# Patient Record
Sex: Female | Born: 1950 | Race: Black or African American | Hispanic: No | Smoking: Former smoker
Health system: Southern US, Community
[De-identification: ages and names within clinical notes are randomized; demographics above are authoritative.]

## PROBLEM LIST (undated history)

## (undated) DIAGNOSIS — J449 Chronic obstructive pulmonary disease, unspecified: Secondary | ICD-10-CM

## (undated) DIAGNOSIS — I739 Peripheral vascular disease, unspecified: Secondary | ICD-10-CM

## (undated) DIAGNOSIS — L97909 Non-pressure chronic ulcer of unspecified part of unspecified lower leg with unspecified severity: Secondary | ICD-10-CM

## (undated) DIAGNOSIS — E119 Type 2 diabetes mellitus without complications: Secondary | ICD-10-CM

## (undated) DIAGNOSIS — I10 Essential (primary) hypertension: Secondary | ICD-10-CM

## (undated) DIAGNOSIS — I219 Acute myocardial infarction, unspecified: Secondary | ICD-10-CM

## (undated) DIAGNOSIS — I2510 Atherosclerotic heart disease of native coronary artery without angina pectoris: Secondary | ICD-10-CM

## (undated) DIAGNOSIS — D649 Anemia, unspecified: Secondary | ICD-10-CM

## (undated) DIAGNOSIS — E785 Hyperlipidemia, unspecified: Secondary | ICD-10-CM

## (undated) DIAGNOSIS — N189 Chronic kidney disease, unspecified: Secondary | ICD-10-CM

## (undated) DIAGNOSIS — E875 Hyperkalemia: Secondary | ICD-10-CM

## (undated) DIAGNOSIS — I639 Cerebral infarction, unspecified: Secondary | ICD-10-CM

## (undated) DIAGNOSIS — J45901 Unspecified asthma with (acute) exacerbation: Secondary | ICD-10-CM

## (undated) DIAGNOSIS — J441 Chronic obstructive pulmonary disease with (acute) exacerbation: Secondary | ICD-10-CM

## (undated) DIAGNOSIS — N184 Chronic kidney disease, stage 4 (severe): Secondary | ICD-10-CM

## (undated) DIAGNOSIS — R300 Dysuria: Secondary | ICD-10-CM

## (undated) DIAGNOSIS — Z951 Presence of aortocoronary bypass graft: Secondary | ICD-10-CM

## (undated) DIAGNOSIS — B348 Other viral infections of unspecified site: Secondary | ICD-10-CM

## (undated) HISTORY — DX: Chronic kidney disease, stage 4 (severe): N18.4

## (undated) HISTORY — PX: BELOW KNEE LEG AMPUTATION: SUR23

## (undated) HISTORY — PX: TUBAL LIGATION: SHX77

## (undated) HISTORY — PX: CORONARY ARTERY BYPASS GRAFT: SHX141

## (undated) HISTORY — DX: Hyperkalemia: E87.5

## (undated) HISTORY — DX: Dysuria: R30.0

## (undated) HISTORY — DX: Unspecified asthma with (acute) exacerbation: J45.901

## (undated) HISTORY — DX: Chronic obstructive pulmonary disease with (acute) exacerbation: J44.1

## (undated) HISTORY — DX: Other viral infections of unspecified site: B34.8

## (undated) HISTORY — PX: COLONOSCOPY, DIAGNOSTIC (SCREENING): SHX174

---

## 2013-06-16 HISTORY — PX: OTHER SURGICAL HISTORY: SHX169

## 2014-02-27 DIAGNOSIS — I2510 Atherosclerotic heart disease of native coronary artery without angina pectoris: Secondary | ICD-10-CM

## 2014-02-27 DIAGNOSIS — L97909 Non-pressure chronic ulcer of unspecified part of unspecified lower leg with unspecified severity: Secondary | ICD-10-CM

## 2014-02-27 DIAGNOSIS — R5381 Other malaise: Secondary | ICD-10-CM

## 2014-02-27 DIAGNOSIS — N189 Chronic kidney disease, unspecified: Secondary | ICD-10-CM

## 2014-02-27 DIAGNOSIS — J449 Chronic obstructive pulmonary disease, unspecified: Secondary | ICD-10-CM

## 2014-02-27 DIAGNOSIS — R55 Syncope and collapse: Secondary | ICD-10-CM

## 2014-02-27 DIAGNOSIS — I214 Non-ST elevation (NSTEMI) myocardial infarction: Secondary | ICD-10-CM

## 2014-02-27 DIAGNOSIS — N183 Chronic kidney disease, stage 3 (moderate): Secondary | ICD-10-CM

## 2014-02-27 DIAGNOSIS — E1165 Type 2 diabetes mellitus with hyperglycemia: Secondary | ICD-10-CM

## 2014-02-28 ENCOUNTER — Inpatient Hospital Stay: Payer: BLUE CROSS/BLUE SHIELD | Admitting: Internal Medicine

## 2014-02-28 ENCOUNTER — Inpatient Hospital Stay: Payer: BLUE CROSS/BLUE SHIELD

## 2014-02-28 ENCOUNTER — Inpatient Hospital Stay
Admission: AD | Admit: 2014-02-28 | Discharge: 2014-03-14 | DRG: 235 | Disposition: A | Discharge status: Rehabilitation Facility | Payer: BLUE CROSS/BLUE SHIELD | Source: Other Acute Inpatient Hospital | Attending: Thoracic Surgery (Cardiothoracic Vascular Surgery) | Admitting: Thoracic Surgery (Cardiothoracic Vascular Surgery)

## 2014-02-28 DIAGNOSIS — I70234 Atherosclerosis of native arteries of right leg with ulceration of heel and midfoot: Secondary | ICD-10-CM | POA: Diagnosis present

## 2014-02-28 DIAGNOSIS — L97909 Non-pressure chronic ulcer of unspecified part of unspecified lower leg with unspecified severity: Secondary | ICD-10-CM

## 2014-02-28 DIAGNOSIS — I700 Atherosclerosis of aorta: Secondary | ICD-10-CM | POA: Diagnosis present

## 2014-02-28 DIAGNOSIS — I252 Old myocardial infarction: Secondary | ICD-10-CM

## 2014-02-28 DIAGNOSIS — N390 Urinary tract infection, site not specified: Secondary | ICD-10-CM | POA: Diagnosis present

## 2014-02-28 DIAGNOSIS — I739 Peripheral vascular disease, unspecified: Secondary | ICD-10-CM

## 2014-02-28 DIAGNOSIS — E119 Type 2 diabetes mellitus without complications: Secondary | ICD-10-CM | POA: Diagnosis present

## 2014-02-28 DIAGNOSIS — F419 Anxiety disorder, unspecified: Secondary | ICD-10-CM | POA: Diagnosis present

## 2014-02-28 DIAGNOSIS — I2582 Chronic total occlusion of coronary artery: Secondary | ICD-10-CM | POA: Diagnosis present

## 2014-02-28 DIAGNOSIS — N183 Chronic kidney disease, stage 3 unspecified: Secondary | ICD-10-CM

## 2014-02-28 DIAGNOSIS — L97419 Non-pressure chronic ulcer of right heel and midfoot with unspecified severity: Secondary | ICD-10-CM | POA: Diagnosis present

## 2014-02-28 DIAGNOSIS — I214 Non-ST elevation (NSTEMI) myocardial infarction: Secondary | ICD-10-CM

## 2014-02-28 DIAGNOSIS — J449 Chronic obstructive pulmonary disease, unspecified: Secondary | ICD-10-CM

## 2014-02-28 DIAGNOSIS — I080 Rheumatic disorders of both mitral and aortic valves: Secondary | ICD-10-CM | POA: Diagnosis present

## 2014-02-28 DIAGNOSIS — Z880 Allergy status to penicillin: Secondary | ICD-10-CM

## 2014-02-28 DIAGNOSIS — I255 Ischemic cardiomyopathy: Secondary | ICD-10-CM | POA: Diagnosis present

## 2014-02-28 DIAGNOSIS — Z955 Presence of coronary angioplasty implant and graft: Secondary | ICD-10-CM

## 2014-02-28 DIAGNOSIS — E875 Hyperkalemia: Secondary | ICD-10-CM | POA: Diagnosis not present

## 2014-02-28 DIAGNOSIS — Z794 Long term (current) use of insulin: Secondary | ICD-10-CM

## 2014-02-28 DIAGNOSIS — L97319 Non-pressure chronic ulcer of right ankle with unspecified severity: Secondary | ICD-10-CM

## 2014-02-28 DIAGNOSIS — I160 Hypertensive urgency: Secondary | ICD-10-CM | POA: Diagnosis present

## 2014-02-28 DIAGNOSIS — I2510 Atherosclerotic heart disease of native coronary artery without angina pectoris: Secondary | ICD-10-CM

## 2014-02-28 DIAGNOSIS — I129 Hypertensive chronic kidney disease with stage 1 through stage 4 chronic kidney disease, or unspecified chronic kidney disease: Secondary | ICD-10-CM | POA: Diagnosis present

## 2014-02-28 DIAGNOSIS — I10 Essential (primary) hypertension: Secondary | ICD-10-CM

## 2014-02-28 DIAGNOSIS — E785 Hyperlipidemia, unspecified: Secondary | ICD-10-CM | POA: Diagnosis present

## 2014-02-28 DIAGNOSIS — E1165 Type 2 diabetes mellitus with hyperglycemia: Secondary | ICD-10-CM

## 2014-02-28 DIAGNOSIS — E869 Volume depletion, unspecified: Secondary | ICD-10-CM | POA: Diagnosis present

## 2014-02-28 DIAGNOSIS — D631 Anemia in chronic kidney disease: Secondary | ICD-10-CM | POA: Diagnosis present

## 2014-02-28 DIAGNOSIS — Z8249 Family history of ischemic heart disease and other diseases of the circulatory system: Secondary | ICD-10-CM

## 2014-02-28 DIAGNOSIS — N189 Chronic kidney disease, unspecified: Secondary | ICD-10-CM

## 2014-02-28 DIAGNOSIS — E1122 Type 2 diabetes mellitus with diabetic chronic kidney disease: Secondary | ICD-10-CM | POA: Diagnosis present

## 2014-02-28 DIAGNOSIS — N170 Acute kidney failure with tubular necrosis: Secondary | ICD-10-CM | POA: Diagnosis present

## 2014-02-28 DIAGNOSIS — E872 Acidosis: Secondary | ICD-10-CM | POA: Diagnosis present

## 2014-02-28 DIAGNOSIS — Z87891 Personal history of nicotine dependence: Secondary | ICD-10-CM

## 2014-02-28 HISTORY — DX: Anemia, unspecified: D64.9

## 2014-02-28 HISTORY — DX: Peripheral vascular disease, unspecified: I73.9

## 2014-02-28 HISTORY — DX: Chronic kidney disease, unspecified: N18.9

## 2014-02-28 HISTORY — DX: Atherosclerotic heart disease of native coronary artery without angina pectoris: I25.10

## 2014-02-28 HISTORY — DX: Acute myocardial infarction, unspecified: I21.9

## 2014-02-28 HISTORY — DX: Essential (primary) hypertension: I10

## 2014-02-28 HISTORY — DX: Non-pressure chronic ulcer of unspecified part of unspecified lower leg with unspecified severity: L97.909

## 2014-02-28 HISTORY — DX: Hyperlipidemia, unspecified: E78.5

## 2014-02-28 HISTORY — DX: Chronic obstructive pulmonary disease, unspecified: J44.9

## 2014-02-28 HISTORY — DX: Type 2 diabetes mellitus without complications: E11.9

## 2014-02-28 LAB — CBC AND DIFFERENTIAL
Basophils Absolute Automated: 0.01 10*3/uL (ref 0.00–0.20)
Basophils Automated: 0 %
Eosinophils Absolute Automated: 0.57 10*3/uL (ref 0.00–0.70)
Eosinophils Automated: 8 %
Hematocrit: 30.4 % — ABNORMAL LOW (ref 37.0–47.0)
Hgb: 9 g/dL — ABNORMAL LOW (ref 12.0–16.0)
Immature Granulocytes Absolute: 0.02 10*3/uL
Immature Granulocytes: 0 %
Lymphocytes Absolute Automated: 1.92 10*3/uL (ref 0.50–4.40)
Lymphocytes Automated: 28 %
MCH: 24.6 pg — ABNORMAL LOW (ref 28.0–32.0)
MCHC: 29.6 g/dL — ABNORMAL LOW (ref 32.0–36.0)
MCV: 83.1 fL (ref 80.0–100.0)
MPV: 10 fL (ref 9.4–12.3)
Monocytes Absolute Automated: 0.76 10*3/uL (ref 0.00–1.20)
Monocytes: 11 %
Neutrophils Absolute: 3.59 10*3/uL (ref 1.80–8.10)
Neutrophils: 52 %
Nucleated RBC: 0 /100 WBC (ref 0–1)
Platelets: 297 10*3/uL (ref 140–400)
RBC: 3.66 10*6/uL — ABNORMAL LOW (ref 4.20–5.40)
RDW: 17 % — ABNORMAL HIGH (ref 12–15)
WBC: 6.87 10*3/uL (ref 3.50–10.80)

## 2014-02-28 LAB — COMPREHENSIVE METABOLIC PANEL
ALT: 13 U/L (ref 0–55)
AST (SGOT): 81 U/L — ABNORMAL HIGH (ref 5–34)
Albumin/Globulin Ratio: 0.6 — ABNORMAL LOW (ref 0.9–2.2)
Albumin: 2.7 g/dL — ABNORMAL LOW (ref 3.5–5.0)
Alkaline Phosphatase: 64 U/L (ref 37–106)
BUN: 24 mg/dL — ABNORMAL HIGH (ref 7.0–19.0)
Bilirubin, Total: 0.1 mg/dL — ABNORMAL LOW (ref 0.2–1.2)
CO2: 23 mEq/L (ref 22–29)
Calcium: 8.8 mg/dL (ref 8.5–10.5)
Chloride: 108 mEq/L (ref 100–111)
Creatinine: 1.3 mg/dL — ABNORMAL HIGH (ref 0.6–1.0)
Globulin: 4.3 g/dL — ABNORMAL HIGH (ref 2.0–3.6)
Glucose: 138 mg/dL — ABNORMAL HIGH (ref 70–100)
Potassium: 4.8 mEq/L (ref 3.5–5.1)
Protein, Total: 7 g/dL (ref 6.0–8.3)
Sodium: 138 mEq/L (ref 136–145)

## 2014-02-28 LAB — PHOSPHORUS: Phosphorus: 2.7 mg/dL (ref 2.3–4.7)

## 2014-02-28 LAB — APTT: PTT: 34 s (ref 23–37)

## 2014-02-28 LAB — TROPONIN I: Troponin I: 21.49 ng/mL — ABNORMAL HIGH (ref 0.00–0.09)

## 2014-02-28 LAB — GLUCOSE WHOLE BLOOD - POCT: Whole Blood Glucose POCT: 146 mg/dL — ABNORMAL HIGH (ref 70–100)

## 2014-02-28 LAB — MAGNESIUM: Magnesium: 1.9 mg/dL (ref 1.6–2.6)

## 2014-02-28 LAB — GFR: EGFR: 41.1

## 2014-02-28 MED ORDER — HEPARIN (PORCINE) IN D5W 50-5 UNIT/ML-% IV SOLN
18.00 [IU]/kg/h | INTRAVENOUS | Status: DC
Start: 2014-02-28 — End: 2014-02-28

## 2014-02-28 MED ORDER — ATORVASTATIN CALCIUM 80 MG PO TABS
80.00 mg | ORAL_TABLET | Freq: Every day | ORAL | Status: DC
Start: 2014-02-28 — End: 2014-03-03
  Administered 2014-03-01 – 2014-03-03 (×3): 80 mg via ORAL
  Filled 2014-02-28 (×3): qty 1

## 2014-02-28 MED ORDER — OXYCODONE-ACETAMINOPHEN 5-325 MG PO TABS
2.00 | ORAL_TABLET | ORAL | Status: DC | PRN
Start: 2014-02-28 — End: 2014-02-28
  Administered 2014-02-28: 2 via ORAL
  Filled 2014-02-28: qty 2

## 2014-02-28 MED ORDER — HYDRALAZINE HCL 25 MG PO TABS
50.0000 mg | ORAL_TABLET | Freq: Three times a day (TID) | ORAL | Status: DC
Start: 2014-02-28 — End: 2014-03-03
  Administered 2014-02-28 – 2014-03-03 (×8): 50 mg via ORAL
  Filled 2014-02-28 (×8): qty 2

## 2014-02-28 MED ORDER — FLUTICASONE-SALMETEROL 230-21 MCG/ACT IN AERO
2.00 | INHALATION_SPRAY | Freq: Two times a day (BID) | RESPIRATORY_TRACT | Status: DC
Start: 2014-02-28 — End: 2014-03-03
  Administered 2014-02-28 – 2014-03-02 (×3): 2 via RESPIRATORY_TRACT
  Filled 2014-02-28: qty 8

## 2014-02-28 MED ORDER — LORAZEPAM 1 MG PO TABS
0.50 mg | ORAL_TABLET | Freq: Three times a day (TID) | ORAL | Status: DC | PRN
Start: 2014-02-28 — End: 2014-03-04

## 2014-02-28 MED ORDER — ASPIRIN 81 MG PO CHEW
81.00 mg | CHEWABLE_TABLET | Freq: Once | ORAL | Status: DC
Start: 2014-02-28 — End: 2014-03-04

## 2014-02-28 MED ORDER — CARVEDILOL 6.25 MG PO TABS
6.25 mg | ORAL_TABLET | Freq: Two times a day (BID) | ORAL | Status: DC
Start: 2014-02-28 — End: 2014-03-01
  Administered 2014-02-28 – 2014-03-01 (×2): 6.25 mg via ORAL
  Filled 2014-02-28 (×2): qty 1

## 2014-02-28 MED ORDER — NITROGLYCERIN IN D5W 200-5 MCG/ML-% IV SOLN
5.00 ug/min | INTRAVENOUS | Status: DC
Start: 2014-02-28 — End: 2014-03-01
  Administered 2014-02-28: 40 ug/min via INTRAVENOUS
  Filled 2014-02-28: qty 250

## 2014-02-28 MED ORDER — DEXTROSE 50 % IV SOLN
25.00 mL | INTRAVENOUS | Status: DC | PRN
Start: 2014-02-28 — End: 2014-03-04

## 2014-02-28 MED ORDER — INSULIN ASPART 100 UNIT/ML SC SOLN
1.00 [IU] | SUBCUTANEOUS | Status: DC | PRN
Start: 2014-02-28 — End: 2014-03-04
  Administered 2014-03-01: 1 [IU] via SUBCUTANEOUS
  Filled 2014-02-28: qty 1

## 2014-02-28 MED ORDER — ALBUTEROL-IPRATROPIUM 2.5-0.5 (3) MG/3ML IN SOLN
3.00 mL | Freq: Four times a day (QID) | RESPIRATORY_TRACT | Status: DC | PRN
Start: 2014-02-28 — End: 2014-03-03

## 2014-02-28 MED ORDER — GLUCAGON 1 MG IJ SOLR (WRAP)
1.00 mg | INTRAMUSCULAR | Status: DC | PRN
Start: 2014-02-28 — End: 2014-03-04

## 2014-02-28 MED ORDER — OXYCODONE-ACETAMINOPHEN 5-325 MG PO TABS
1.00 | ORAL_TABLET | ORAL | Status: DC | PRN
Start: 2014-02-28 — End: 2014-03-04
  Administered 2014-03-01 – 2014-03-04 (×7): 1 via ORAL
  Filled 2014-02-28 (×7): qty 1

## 2014-02-28 MED ORDER — TIOTROPIUM BROMIDE MONOHYDRATE 18 MCG IN CAPS
18.00 ug | ORAL_CAPSULE | Freq: Every morning | RESPIRATORY_TRACT | Status: DC
Start: 2014-02-28 — End: 2014-03-03
  Administered 2014-03-01 – 2014-03-02 (×2): 18 ug via RESPIRATORY_TRACT
  Filled 2014-02-28: qty 5

## 2014-02-28 MED ORDER — HEPARIN SODIUM (PORCINE) 5000 UNIT/ML IJ SOLN
3000.00 [IU] | INTRAMUSCULAR | Status: DC | PRN
Start: 2014-02-28 — End: 2014-03-04
  Administered 2014-03-02: 3000 [IU] via INTRAVENOUS
  Filled 2014-02-28: qty 1

## 2014-02-28 MED ORDER — HEPARIN SODIUM (PORCINE) 5000 UNIT/ML IJ SOLN
80.00 [IU]/kg | INTRAMUSCULAR | Status: DC | PRN
Start: 2014-02-28 — End: 2014-02-28

## 2014-02-28 MED ORDER — MORPHINE SULFATE 4 MG/ML IJ/IV SOLN (WRAP)
0.5000 mg | Freq: Once | Status: DC
Start: 2014-02-28 — End: 2014-02-28
  Filled 2014-02-28: qty 1

## 2014-02-28 MED ORDER — ALBUTEROL-IPRATROPIUM 2.5-0.5 (3) MG/3ML IN SOLN
3.00 mL | Freq: Four times a day (QID) | RESPIRATORY_TRACT | Status: DC
Start: 2014-02-28 — End: 2014-02-28
  Administered 2014-02-28: 3 mL via RESPIRATORY_TRACT
  Filled 2014-02-28: qty 3

## 2014-02-28 MED ORDER — VENLAFAXINE HCL ER 75 MG PO CP24
75.0000 mg | ORAL_CAPSULE | ORAL | Status: DC
Start: 2014-02-28 — End: 2014-03-04
  Administered 2014-02-28 – 2014-03-03 (×4): 75 mg via ORAL
  Filled 2014-02-28 (×5): qty 1

## 2014-02-28 MED ORDER — GABAPENTIN 300 MG PO CAPS
600.00 mg | ORAL_CAPSULE | Freq: Three times a day (TID) | ORAL | Status: DC
Start: 2014-02-28 — End: 2014-03-04
  Administered 2014-02-28 – 2014-03-04 (×11): 600 mg via ORAL
  Filled 2014-02-28 (×11): qty 2

## 2014-02-28 MED ORDER — HEPARIN SODIUM (PORCINE) 5000 UNIT/ML IJ SOLN
80.00 [IU]/kg | Freq: Once | INTRAMUSCULAR | Status: DC
Start: 2014-02-28 — End: 2014-02-28

## 2014-02-28 MED ORDER — POLYETHYLENE GLYCOL 3350 17 G PO PACK
17.0000 g | PACK | Freq: Every day | ORAL | Status: DC
Start: 2014-02-28 — End: 2014-03-04
  Filled 2014-02-28 (×2): qty 1

## 2014-02-28 MED ORDER — HYDRALAZINE HCL 25 MG PO TABS
50.0000 mg | ORAL_TABLET | Freq: Three times a day (TID) | ORAL | Status: DC
Start: 2014-02-28 — End: 2014-02-28

## 2014-02-28 MED ORDER — VENLAFAXINE HCL 75 MG PO TABS
75.0000 mg | ORAL_TABLET | Freq: Two times a day (BID) | ORAL | Status: DC
Start: 2014-02-28 — End: 2014-02-28
  Filled 2014-02-28: qty 1

## 2014-02-28 MED ORDER — VITAMIN C 500 MG PO TABS
500.0000 mg | ORAL_TABLET | Freq: Every day | ORAL | Status: DC
Start: 2014-02-28 — End: 2014-03-04
  Administered 2014-03-01 – 2014-03-03 (×3): 500 mg via ORAL
  Filled 2014-02-28 (×3): qty 1

## 2014-02-28 MED ORDER — MAGNESIUM SULFATE IN D5W 10-5 MG/ML-% IV SOLN
1.0000 g | INTRAVENOUS | Status: AC
Start: 2014-02-28 — End: 2014-02-28
  Administered 2014-02-28 (×2): 1 g via INTRAVENOUS
  Filled 2014-02-28 (×2): qty 100

## 2014-02-28 MED ORDER — SENNA 8.6 MG PO TABS
8.6000 mg | ORAL_TABLET | Freq: Two times a day (BID) | ORAL | Status: DC
Start: 2014-02-28 — End: 2014-03-04
  Administered 2014-03-03 (×2): 8.6 mg via ORAL
  Filled 2014-02-28 (×3): qty 1

## 2014-02-28 MED ORDER — HEPARIN SODIUM (PORCINE) 5000 UNIT/ML IJ SOLN
40.00 [IU]/kg | INTRAMUSCULAR | Status: DC | PRN
Start: 2014-02-28 — End: 2014-02-28

## 2014-02-28 MED ORDER — HEPARIN (PORCINE) IN D5W 50-5 UNIT/ML-% IV SOLN
12.00 [IU]/kg/h | INTRAVENOUS | Status: DC
Start: 2014-02-28 — End: 2014-03-04
  Administered 2014-02-28: 850 [IU]/h via INTRAVENOUS
  Administered 2014-03-01: 950 [IU]/h via INTRAVENOUS
  Administered 2014-03-03: 850 [IU]/h via INTRAVENOUS
  Administered 2014-03-04: 950 [IU]/h via INTRAVENOUS
  Filled 2014-02-28 (×4): qty 500

## 2014-02-28 NOTE — Plan of Care (Signed)
PGY 3 Critical Care Unit  Patient with signs of anxiety.  Further reviewed home medication list extensively and it appears patient was taking Effexor XR 75 daily.  Will resume this medication in addition to providing when necessary anxiolytic at very low dose.  Patient is not elderly &  with no active contraindications to anxiolytic.

## 2014-02-28 NOTE — H&P (Signed)
ADMISSION HISTORY AND PHYSICAL EXAM    Date Time: 02/28/2014 6:34 PM  Patient Name: [name],[name]  Attending Physician: Marikay Alar, MD  Primary Care Physician: No primary care provider on file.    CC: Transfer for possible coronary artery bypass graft      Assessment:   Active Problems:    NSTEMI (non-ST elevated myocardial infarction)    63 year old female with CAD , status post presumed DES to left circumflex ~ January 2015, further clarification of multivessel CAD on 02/28/14 angiography at [facility] with: 1.  Distal left main, 60 % stenosis, ostial.  2.  Ostial circumflex, 80 % stenosis, ulcerated.  3.  3rd OM with ostial lesion.  4 100 % mid RCA with left to right collaterals; PAD w/ chronic -right lower extremity arterial ulcer- overall improving without any features of infection; IDDM (HA1c=7.1), CKD,  admitted with syncope on 02/26/14 at [facility] for syncope.  Mild troponin elevation to 0.15 with suggestion of an NSTEMI, prompting LHC 02/28/14 with results as featured above , prompting transfer to [facility] for possible CABG setting of multivessel disease.  Now with evidence of type 4a MI associated with PCI  (trop=21 on 02/28/14).  Clinical concern that chronic right LE ulcer may present contraindication to surgery.  Will evaluate further with infectious disease 03/01/14. AKI on CKD overall resolving.         Plan:         #NSTEMI VS Type 4A MI: No EKG changes, no chest pain.  Isolated troponin bump from 0.15 to 21  Following angiography 12/14.  Trending troponins.  Continue Coreg, continue aspirin, continue low intensity heparin drip, continue atorvastatin 80, hold ACE inhibitor with recovering AKI hold clopidogrel with possible coronary artery bypass graft this week.  Titrate down nitro drip as patient experiencing headache.     # Multivessel CAD: 1.  Distal left main, 60 % stenosis, ostial.  2.  Ostial circumflex, 80 % stenosis, ulcerated.  3.  3rd OM with ostial lesion.  4 100 % mid RCA with  left to right collaterals; Chronically occluded right coronary artery stent  (per Angio 06/2013).   -> cardiothoracic surgery to evaluate potential for coronary artery bypass graft.      #PAD w/ chronic -medial right lower extremity arterial ulcer-status post angioplasty in April 2015.  Clinically improving after reviewing patient's photographs.  No clinical evidence of infection at this time.  Wound culture from 12/13 at [facility] indicates many mixed flora with no predominating organism.  We will ask infectious disease to assess in the setting of potential coronary artery bypass grafts.    -> wound nurse consultation placed.  Follow-up blood cultures.   ->  decreased from 2 tabs to 1 tab Percocet per patient.      #hypertension: Difficult to control with headache and right lower extremity pain.  Taper down nitro drip as patient experiencing headache.  Increase frequency of home hydralazine to 50 every 8.  Increase Coreg to 6.25 every 12.  May need to give clonidine 0.1 if pain control does not improve headache.    #type 2 diabetes: Insulin-dependent.  H A1c = 7.1 on 12/12.  Patient reports only using sliding scale insulin with meals at home.  Will continue sliding scale insulin-low dose at present.  We will reevaluate if Patient is candidate for basal insulin, but currently well controlled on outpatient regimen  It seems.    #COPD: Wheezy following transfer on exam. Reviewed records from April.  Patient was  to be taking both Spiriva and Symbicort as outpatient.  The patient had not been taking these medications recently.  Check x-ray.  Resume Spiriva and Symbicort equivalent while in hospital and provide standing Q 6 Duonebs at present.      #asymptomatic bacteriuria.  Greater than 100,000 colony-forming units GNR at [facility].patient is without symptoms.  Repeat urinalysis, consider antibiotic therapy if surgery being considered in setting of diabetes.  Will discuss further with infectious disease.      #Syncope: Felt  to be vasovagal versus cardiogenic versus hypovolemic from excessive outpatient diuresis.  No further evidence of syncope recurring since admission.  Continue to monitor on telemetry.    #AKI 2/2: Volume depletion and iatrogenic injury on outpatient ACE inhibitor, nonsteroidal anti-inflammatory drug, Lasix , with underlying CKD.  CKD felt to be secondary to diabetes, hypertension.  Course:C  r =1.4 on 02/28/14, 2.3 on 02/26/14.  Continuing to hold ACE inhibitor and Lasix.  Will quantify proteinuria.  Obtain additional urine studies including urine electrolytes.  Requested nephrology assistance in assessing prior to coronary artery bypass graft.  Hold on further IVF this time.      #  Anemia: due to chronic disease and iron deficiency at [facility].  Plan was in place for patient to receive dose of Procrit and course of venofer.  Sending iron studies.  Check type and screen with morning labs     #FEN: Cardiac diet, no IV fluid are present.  Will give magnesium 1 g      Reviewed initial management for type 4 MI with Dr. Laveda Norman  We will review further with Dr. Penni Homans.   Disposition:     Today's date: 02/28/2014  Admit Date: 02/28/2014  3:41 PM  Anticipated medical stability for discharge:Red - not tomorrow - estimated month/date: 03/06/14  Service status: Inpatient: risk of morbidity and mortality  Reason for ongoing hospitalization: Possible CABG needed   Anticipated discharge needs: Rehab      History of Presenting Illness:   [name] is a 63 y.o. female w/ coronary artery disease -multivessel being transferred to [facility] for possible coronary artery bypass graft.       Patient has known coronary artery disease, s/p Left circumflex artery w/  presumed DES &   Chronically occluded right coronary artery stent  (per LHC 06/2013) who is  status post 02/28/14 angiography  (@ [facility]) with 1.  Distal left main, 60 % stenosis, ostial.  2.  Ostial circumflex, 80 % stenosis, ulcerated.  3.  3rd OM with  ostial lesion.  4 100 % mid RCA with left to right collaterals; peripheral arterial disease -right lower extremity arterial ulcer S/P angiography, angioplasty, right lower extremity 4/15.      She presented to the emergency room after syncopal event at home.  Essentially, patient awoke in the morning of 12/12 feeling somewhat lightheaded.  She took her normal medications and then ate breakfast.  She noted to have a normal blood glucose.  Also the kitchen table talking to her daughter, she felt lightheaded and had a syncopal event, falling forward and injuring her left head area above left eye.  Syncopal event lasted roughly less than 6/32.  There was no seizure activity, no focal weakness, no bladder or bowel incontinence, no palpitations, no chest pain.  No numbness and tingling, no nausea, vomiting.    Catheterization done on 02/28/14 by Dr. Rodman Key with following findings: 1.  Distal left main, 60 % stenosis, ostial.  2.  Ostial circumflex, 80 % stenosis, ulcerated.  3.  3rd OM with ostial lesion.  4 100 % mid RCA with left to right collaterals.  LV angiogram not done to preserve contrast load, echo, ejection fraction noted to be 60 percent.  Following these angiography findings, patient was recommended to be transferred to [facility] for CV surgery.    Patient was seen by Dr. Thom Chimes with [facility] on 02/28/14 and the assessment was made of AKI on CK D with suspected chronic kidney disease stage 2/3 from hypertension and diabetes with proteinuria seen on urinalysis.  In addition to increased echogenicity on sono.  AKI thought to be secondary to volume depletion compounded by ACE inhibitor, nonsteroidal anti-inflammatory drug at home-naproxen, Lasix.  With notable resolving course since admission.    Patient was seen by Lyla Son and cardiology group including Dr. Forestine Na and patient was found to have evidence of an STEMI during chest pain on the morning of 12/14  and was initiated on heparin and nitro drips.  Echo dated 02/27/14: Normal left ventricular size with mild concentric left ventricular hypertrophy and stage I diastolic dysfunction.  Ejection fraction = 55-50 percent, mild aortic stenosis.  No regurgitation.  Mitral annular calcification with moderate mitral regurgitation.  Upper normal RSVP.  No cardiac emboli sources detected.  LVOT area = 2.37 m.    Troponins reviewed as follows: 12/13 at 04:43 = less than 0.01, 02/27/14 at 10:50 AM less than 0.01, 02/28/14 at 02:33 less than 0.01.  Troponin on 02/28/14 = 0.15.  HG A1c = 7.1    Key labs reviewed from outside hospital data: Sodium = 137, Cr =1.4 on 02/28/14, 2.3 on 02/26/14.  WBC= 7.0.  Hemoglobin = 9.4 on 02/28/14 and 7.8 on 02/27/14.  MCV = 82.  Platelet = 287      Of note, admission earlier this year to [facility] with acute respiratory distress and junctional rhythm with bradycardia requiring atropine and intubation for respiratory failure.  Cardiac angiography was performed on this April admission w/   Unchanged left main disease and chronically occluded right coronary artery stent and patent left circumflex artery.  Transaortic balloon pump was placed on the admission.  Essentially, she was felt to have an STEMI with cardiogenic shock on that April admission.    Past Medical History:     Past Medical History   Diagnosis Date   . Diabetes mellitus    . Chronic kidney disease    . Myocardial infarction    . Hypertension    . Hyperlipidemia    . Peripheral arterial disease      06/28/13 right superficial femoral artery stenosis, tibial peroneal trunk stenosis, left CVL a stenosis, treated with angiography and angioplasty.   . Coronary artery disease      Left circumflex artery presumed DES.,  Chronically occluded right coronary artery stent   . Anemia      She redid to chronic disease and iron deficiency.     . Arterial leg ulcer      2014, receiving home health        Available old records reviewed, including:  All records from [facility] that are available for review.  Records from [facility].    Past Surgical History:     Past Surgical History   Procedure Laterality Date   . Cardiac angiography and angioplasty  06/2013   . Colonoscopy  Unknown  Family History:   Mother with diabetes and MI at 42.  Father with diabetes and MI 66.  Brother was deceased 18 with congestive heart failure.    Social History:     History   Smoking status   . Not on file   Smokeless tobacco   . Not on file     History   Alcohol Use: Not on file     History   Drug Use Not on file       Allergies:     Allergies   Allergen Reactions   . Penicillins Anaphylaxis and Angioedema       Medications:   HOME MEDICATIONS BELOW.  Current Discharge Medication List      CONTINUE these medications which have NOT CHANGED    Details   aspirin 81 MG chewable tablet Chew 81 mg by mouth daily.      atorvastatin (LIPITOR) 10 MG tablet Take 10 mg by mouth nightly.      carvedilol (COREG) 3.125 MG tablet Take 3.125 mg by mouth 2 (two) times daily with meals.      furosemide (LASIX) 20 MG tablet Take 20 mg by mouth daily.         gabapentin (NEURONTIN) 300 MG capsule Take 600 mg by mouth 3 (three) times daily.         hydrALAZINE (APRESOLINE) 50 MG tablet Take 50 mg by mouth 2 (two) times daily.      insulin lispro (HUMALOG) 100 UNIT/ML injection Inject into the skin 4 times daily - with meals and at bedtime.      lisinopril (PRINIVIL,ZESTRIL) 5 MG tablet Take 2.5 mg by mouth daily.      naproxen (NAPROSYN) 250 MG tablet Take 250 mg by mouth daily.           Medications on transfer include hydralazine 50 every 12, famotidine 20 daily, Coreg 6.25 twice a day, levofloxacin 250 every 24, sliding scale insulin, aspirin 81 daily, atorvastatin 10 daily at bedtime, gabapentin 300 3 times a day.  Isosorbide dinitrate 20 mg daily.    Method by which medications were confirmed on admission: Pt    Review of  Systems:   All other systems were reviewed and are negative except: as above: NO CP, NO Palpitations, No SOB     Physical Exam:     Patient Vitals for the past 24 hrs:   BP Temp Temp src Pulse Resp SpO2 Height Weight   02/28/14 1730 180/82 mmHg - - 88 14 98 % - -   02/28/14 1700 179/77 mmHg - - 90 (!) 25 99 % - -   02/28/14 1645 181/84 mmHg - - 91 15 99 % - -   02/28/14 1630 181/83 mmHg - - 90 17 99 % - -   02/28/14 1615 181/66 mmHg - - 89 16 99 % - -   02/28/14 1600 191/88 mmHg 98.5 F (36.9 C) Axillary 87 16 99 % 1.562 m (5' 1.5") 72.712 kg (160 lb 4.8 oz)     Body mass index is 29.8 kg/(m^2).  No intake or output data in the 24 hours ending 02/28/14 1834    General: Pleasant, somewhat tired appearing African-American woman.  awake, alert, oriented x 3; no acute distress.  HEENT: Mild abrasion with hematoma above left eye.  Mildly tender to palpation.    Neck: Difficult to visualize JVP.  Hepatojugular reflux negative.  Cardiovascular: Normal rate, regular rhythm.  2-3/6 early harsh systolic murmur at right  upper sternal border without radiation to carotids, no S3.  No S4.  Normal S2 splitting with inspiration.  Lungs: Decreased breath sounds at both bases bilaterally.  Wheezing notable  Abdomen: soft, non-tender, non-distended; no palpable masses, no hepatosplenomegaly, normoactive bowel sounds, no rebound or guarding  Extremities:     right medial lower extremity ulcer.  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  Scheffler tendon and bone.  No surrounding erythema.  No purulence.  Granulation tissue seen surrounding bone   - chronic ischemic changes seen to bilateral feet with thin lower extremity muscles and no hair.  No lower extremity edema.  Right femoral cath site with no evidence of hematoma, no pulsation, no swelling.  Neuro: cranial nerves grossly intact, sensation intact.  Strength grossly intact.  Skin: As above  Other: No Foley.  Pelvic inflammatory disease on right hand.      Labs:     Results      Procedure Component Value Units Date/Time    Comprehensive metabolic panel [[id number]]  (Abnormal) Collected:  02/28/14 1728    Specimen Information:  Blood Updated:  02/28/14 1808     Glucose 138 (H) mg/dL      BUN 01.7 (H) mg/dL      Creatinine 1.3 (H) mg/dL      Sodium 510 mEq/L      Potassium 4.8 mEq/L      Chloride 108 mEq/L      CO2 23 mEq/L      CALCIUM 8.8 mg/dL      Protein, Total 7.0 g/dL      Albumin 2.7 (L) g/dL      AST (SGOT) 81 (H) U/L      ALT 13 U/L      Alkaline Phosphatase 64 U/L      Bilirubin, Total 0.1 (L) mg/dL      Globulin 4.3 (H) g/dL      Albumin/Globulin Ratio 0.6 (L)     Narrative:      Starting for 1 Occurrences    Magnesium [[id number]] Collected:  02/28/14 1728    Specimen Information:  Blood Updated:  02/28/14 1808     Magnesium 1.9 mg/dL     Narrative:      Starting for 1 Occurrences    Phosphorus [[id number]] Collected:  02/28/14 1728    Specimen Information:  Blood Updated:  02/28/14 1808     Phosphorus 2.7 mg/dL     Narrative:      Starting for 1 Occurrences    GFR [[id number]] Collected:  02/28/14 1728     EGFR 41.1 Updated:  02/28/14 1808    Narrative:      Starting for 1 Occurrences    Troponin I [[id number]]  (Abnormal) Collected:  02/28/14 1728    Specimen Information:  Blood Updated:  02/28/14 1806     Troponin I 21.49 (H) ng/mL     Narrative:      Starting for 1 Occurrences    APTT [[id number]] Collected:  02/28/14 1728     PTT 34 sec Updated:  02/28/14 1754    Narrative:      Starting for 1 Occurrences    CBC and differential [[id number]]  (Abnormal) Collected:  02/28/14 1728    Specimen Information:  Blood / Blood Updated:  02/28/14 1746     WBC 6.87 x10 3/uL      RBC 3.66 (L) x10 6/uL      Hgb 9.0 (L) g/dL      Hematocrit  30.4 (L) %      MCV 83.1 fL      MCH 24.6 (L) pg      MCHC 29.6 (L) g/dL      RDW 17 (H) %      Platelets 297 x10 3/uL      MPV 10.0 fL      Neutrophils 52 %      Lymphocytes Automated 28 %      Monocytes 11 %      Eosinophils Automated 8 %      Basophils  Automated 0 %      Immature Granulocyte 0 %      Nucleated RBC 0 /100 WBC      Neutrophils Absolute 3.59 x10 3/uL      Abs Lymph Automated 1.92 x10 3/uL      Abs Mono Automated 0.76 x10 3/uL      Abs Eos Automated 0.57 x10 3/uL      Absolute Baso Automated 0.01 x10 3/uL      Absolute Immature Granulocyte 0.02 x10 3/uL     Narrative:      Starting for 1 Occurrences    MRSA culture [[id number]] Collected:  02/28/14 1657    Specimen Information:  Body Fluid / Nares and Throat Updated:  02/28/14 1657        Wound culture dated 12/13 = many mixed flora with no predominant organism.  Urine culture greater than 100,000 GNR's.      Imaging results from outside hospital reviewed with the following findings:  Following studies are taken from [facility] records.  Ultrasound kidneys done 02/27/14.  Impression: 1.  Bilateral increased renal cortical echogenicity consistent with medical renal disease.  2.  Complex 2.2 cm cyst involving upper pole of left kidney.  Follow-up ultrasound or MRI is recommended 3 months.  3.  Large bladder volume.  Patient was unable to void.    CT without contrast dated 02/26/14:1.  No CT evidence of acute intracranial pathology.  2.  Mild diffuse cerebral atrophy, and minimal chronic deep white matter microvascular ischemic change.  3.  Bilateral-severe ethmoid, mild to moderate bilateral maxillary sinus disease.    EKG review from [place] 02/28/14 7:35 AM  left ventricular hypertrophy with secondary report normality.  Sinus rhythm.  Borderline prolonged QTC.  Isolated Q wave in lead 3.  Borderline concavities.  T waves with abnormality consistent to April 2015.  EKG at 6:14 AM notable for possible anterolateral infarction with pronounced Q waves in lead, ST depressions in V4 V6, V5    Safety Checklist  DVT prophylaxis:  CHEST guideline (See page [id number]) Chemical   Foley:  Coffeen Rn Foley protocol Not present   IVs:  Peripheral IV   PT/OT: Ordered   Daily CBC & or  Chem ordered:  SHM/ABIM guidelines (see #5) Yes, due to clinical and lab instability   Reference for approximate charges of common labs: CBC auto diff - $76  BMP - $99  Mg - $79    Signed by: Leroy Libman., MD   cc:No primary care provider on file.

## 2014-02-28 NOTE — Progress Notes (Signed)
Bedside spirometry measurement performed .Unable to obtain more then one (Good blow)  Despite multiple attempt Patient effort was fair. Result is faxed     02/28/14 1950   Spirometry Pre and/or Post   Procedure Location and Type Bedside- Pre Only   Pre FVC 0.99 L   Pre FEV1 0.57 L   Pre FEV1/FVC % (Calculated) 58   Patient follows commands Yes   Cough during exhalation No   Early termination Yes   Leak No   Variable effort Yes   2 FVC 150 ml of each other No   2 FEV1 150 ml of each other No   3 maneuvers performed Yes   Patient Effort Fair   Adverse Reactions None   Primary Charges   $ Spirometry Type Performed Simple - CPT 94010    to medical record

## 2014-02-28 NOTE — Plan of Care (Signed)
Patient arrived from [facility] by EMS around 1600.  She was on Nitro gtt at 19mcg/min for BP control, Sodium Bicarb gtt at 74ml/hr, and weight based heparin gtt.  She arrived ST 100s-110, no complaints of chest pain.  Only complaint is pain to the right foot.  When dressing to Right foot was taken down a stage 4 wound was noted. Wet to dry dressing done (as per patient request as this is what she does for it at home) and foot was wrapped in gauze and kerlix with an ace wrap. Percocet has been given for pain.  She remains on the Nitro gtt at for SBP >160.  Coreg PO was started per Dr. Laveda Norman and Dr. Hyacinth Meeker in order to hopefully wean down the Nitro gtt.  She was also switched to our LI Heparin gtt at 850units/hr.  Next APTT at 2330.

## 2014-03-01 ENCOUNTER — Inpatient Hospital Stay: Payer: BLUE CROSS/BLUE SHIELD

## 2014-03-01 ENCOUNTER — Other Ambulatory Visit: Payer: Self-pay

## 2014-03-01 DIAGNOSIS — I255 Ischemic cardiomyopathy: Secondary | ICD-10-CM | POA: Diagnosis present

## 2014-03-01 DIAGNOSIS — I2510 Atherosclerotic heart disease of native coronary artery without angina pectoris: Secondary | ICD-10-CM

## 2014-03-01 LAB — CBC AND DIFFERENTIAL
Basophils Absolute Automated: 0.02 10*3/uL (ref 0.00–0.20)
Basophils Automated: 0 %
Eosinophils Absolute Automated: 0.78 10*3/uL — ABNORMAL HIGH (ref 0.00–0.70)
Eosinophils Automated: 11 %
Hematocrit: 29.9 % — ABNORMAL LOW (ref 37.0–47.0)
Hgb: 9.1 g/dL — ABNORMAL LOW (ref 12.0–16.0)
Immature Granulocytes Absolute: 0.01 10*3/uL
Immature Granulocytes: 0 %
Lymphocytes Absolute Automated: 1.9 10*3/uL (ref 0.50–4.40)
Lymphocytes Automated: 27 %
MCH: 24.9 pg — ABNORMAL LOW (ref 28.0–32.0)
MCHC: 30.4 g/dL — ABNORMAL LOW (ref 32.0–36.0)
MCV: 81.7 fL (ref 80.0–100.0)
MPV: 10 fL (ref 9.4–12.3)
Monocytes Absolute Automated: 1 10*3/uL (ref 0.00–1.20)
Monocytes: 14 %
Neutrophils Absolute: 3.23 10*3/uL (ref 1.80–8.10)
Neutrophils: 47 %
Nucleated RBC: 0 /100 WBC (ref 0–1)
Platelets: 297 10*3/uL (ref 140–400)
RBC: 3.66 10*6/uL — ABNORMAL LOW (ref 4.20–5.40)
RDW: 18 % — ABNORMAL HIGH (ref 12–15)
WBC: 6.94 10*3/uL (ref 3.50–10.80)

## 2014-03-01 LAB — URINALYSIS WITH MICROSCOPIC
Bilirubin, UA: NEGATIVE
Blood, UA: NEGATIVE
Glucose, UA: NEGATIVE
Ketones UA: NEGATIVE
Nitrite, UA: NEGATIVE
Protein, UR: 100 — AB
Specific Gravity UA: 1.013 (ref 1.001–1.035)
Urine pH: 6.5 (ref 5.0–8.0)
Urobilinogen, UA: NORMAL mg/dL

## 2014-03-01 LAB — ECG 12-LEAD
Atrial Rate: 91 {beats}/min
P Axis: 71 degrees
P-R Interval: 130 ms
Q-T Interval: 382 ms
QRS Duration: 78 ms
QTC Calculation (Bezet): 469 ms
R Axis: 23 degrees
T Axis: 43 degrees
Ventricular Rate: 91 {beats}/min

## 2014-03-01 LAB — IRON PROFILE
Iron Saturation: 12 % — ABNORMAL LOW (ref 15–50)
Iron: 33 ug/dL — ABNORMAL LOW (ref 40–145)
TIBC: 265 ug/dL (ref 265–497)
UIBC: 232 ug/dL (ref 126–382)

## 2014-03-01 LAB — BASIC METABOLIC PANEL
BUN: 22 mg/dL — ABNORMAL HIGH (ref 7.0–19.0)
CO2: 21 mEq/L — ABNORMAL LOW (ref 22–29)
Calcium: 9.2 mg/dL (ref 8.5–10.5)
Chloride: 108 mEq/L (ref 100–111)
Creatinine: 1.5 mg/dL — ABNORMAL HIGH (ref 0.6–1.0)
Glucose: 140 mg/dL — ABNORMAL HIGH (ref 70–100)
Potassium: 5.2 mEq/L — ABNORMAL HIGH (ref 3.5–5.1)
Sodium: 137 mEq/L (ref 136–145)

## 2014-03-01 LAB — HEMOLYSIS INDEX: Hemolysis Index: 2 (ref 0–18)

## 2014-03-01 LAB — APTT
PTT: 51 s — ABNORMAL HIGH (ref 23–37)
PTT: 68 s — ABNORMAL HIGH (ref 23–37)
PTT: 53 s — ABNORMAL HIGH (ref 23–37)

## 2014-03-01 LAB — GLUCOSE WHOLE BLOOD - POCT
Whole Blood Glucose POCT: 137 mg/dL — ABNORMAL HIGH (ref 70–100)
Whole Blood Glucose POCT: 127 mg/dL — ABNORMAL HIGH (ref 70–100)
Whole Blood Glucose POCT: 153 mg/dL — ABNORMAL HIGH (ref 70–100)
Whole Blood Glucose POCT: 125 mg/dL — ABNORMAL HIGH (ref 70–100)

## 2014-03-01 LAB — GFR: EGFR: 34.9

## 2014-03-01 LAB — TYPE AND SCREEN
AB Screen Gel: NEGATIVE
ABO Rh: A POS

## 2014-03-01 LAB — MAGNESIUM: Magnesium: 2.6 mg/dL (ref 1.6–2.6)

## 2014-03-01 LAB — TROPONIN I
Troponin I: 16.96 ng/mL — ABNORMAL HIGH (ref 0.00–0.09)
Troponin I: 8.55 ng/mL — ABNORMAL HIGH (ref 0.00–0.09)

## 2014-03-01 LAB — PHOSPHORUS: Phosphorus: 3.2 mg/dL (ref 2.3–4.7)

## 2014-03-01 MED ORDER — DARBEPOETIN ALFA 100 MCG/0.5ML IJ SOSY
100.00 ug | PREFILLED_SYRINGE | INTRAMUSCULAR | Status: DC
Start: 2014-03-01 — End: 2014-03-04
  Administered 2014-03-01: 100 ug via SUBCUTANEOUS
  Filled 2014-03-01: qty 0.5

## 2014-03-01 MED ORDER — CARVEDILOL 6.25 MG PO TABS
6.25 mg | ORAL_TABLET | Freq: Once | ORAL | Status: AC
Start: 2014-03-01 — End: 2014-03-01
  Administered 2014-03-01: 6.25 mg via ORAL
  Filled 2014-03-01: qty 1

## 2014-03-01 MED ORDER — CARVEDILOL 6.25 MG PO TABS
12.50 mg | ORAL_TABLET | Freq: Two times a day (BID) | ORAL | Status: DC
Start: 2014-03-01 — End: 2014-03-02
  Administered 2014-03-01 – 2014-03-02 (×2): 12.5 mg via ORAL
  Filled 2014-03-01 (×2): qty 2

## 2014-03-01 MED ORDER — SODIUM CHLORIDE 0.45 % IV SOLN
INTRAVENOUS | Status: DC
Start: 2014-03-01 — End: 2014-03-04
  Filled 2014-03-01 (×4): qty 1000

## 2014-03-01 NOTE — Consults (Signed)
[facility] CONSULT[facility]    Date Time: 03/01/2014 10:05 AM  Patient Name: [name]  Requesting Physician: Roxana Hires, MD    Reason for Consultation:   Multivessel CAD    History:   [name] is a 63 y.o. female with a history of multivessl CAD s/p 2 prior PCI, DM2, CKD, HTN, and PAD with chronic RLE wound who transferred to the hospital on 02/28/2014 for CABG evaluation.    Patient is a poor historian.    [facility] had PCI to RCA, early 2015.    Syncopized while at kitchen table, injuring left periorbital region.    Yesterday at [facility] showed:  Distal LM 60%  LAD no severe stenosis  LCX ostial 80%, Om3 osital lesion  RCA 100% mRCA with left to right collaterals (RCA previously stented)    On admission noted to have elevated Cr (2.0 on 12/12), possibly due to long term NSAID use.  Given IVF and Cr decreased to 1.3.    RLE has chronic leg wound since spring 2015. Had angioplasty of R SFA and Tibial-peroneal trunk (June 30 2013). Per patient the wound is improving.       Past Medical History:     Past Medical History   Diagnosis Date   . Diabetes mellitus    . Chronic kidney disease    . Myocardial infarction    . Hypertension    . Hyperlipidemia    . Peripheral arterial disease      06/28/13 right superficial femoral artery stenosis, tibial peroneal trunk stenosis, left CVL a stenosis, treated with angiography and angioplasty.   . Coronary artery disease      Left circumflex artery presumed DES.,  Chronically occluded right coronary artery stent   . Anemia      She redid to chronic disease and iron deficiency.     . Arterial leg ulcer      2014, receiving home health   . Chronic obstructive pulmonary disease      Previously on Spiriva and Advair    Past Surgical History   Procedure Laterality Date   . Cardiac angiography and angioplasty  06/2013   . Colonoscopy  Unknown   . Tubal ligation        Family History:     Family History   Problem Relation Age of Onset   . Coronary artery  disease Mother      Died at 62 from MI.   Marland Kitchen Coronary artery disease Father      Died at 5 from MI   . Diabetes type II       Mother and father     Social History:     History   Substance Use Topics   . Smoking status: Former Smoker -- 1.00 packs/day for 20 years     Quit date: 02/29/2012   . Smokeless tobacco: Not on file   . Alcohol Use: Not on file      Allergies:     Allergies   Allergen Reactions   . Penicillins Anaphylaxis and Angioedema     Medications:   Home Meds:  Prior to Admission medications    Medication Sig Start Date End Date Taking? Authorizing Provider   aspirin 81 MG chewable tablet Chew 81 mg by mouth daily.   Yes [provider]   atorvastatin (LIPITOR) 10 MG tablet Take 10 mg by mouth nightly.   Yes [provider]   carvedilol (COREG) 3.125 MG tablet Take  3.125 mg by mouth 2 (two) times daily with meals.   Yes [provider]   furosemide (LASIX) 20 MG tablet Take 20 mg by mouth daily.      Yes [provider]   gabapentin (NEURONTIN) 300 MG capsule Take 600 mg by mouth 3 (three) times daily.      Yes [provider]   hydrALAZINE (APRESOLINE) 50 MG tablet Take 50 mg by mouth 2 (two) times daily.   Yes [provider]   insulin lispro (HUMALOG) 100 UNIT/ML injection Inject into the skin 4 times daily - with meals and at bedtime.   Yes [provider]   lisinopril (PRINIVIL,ZESTRIL) 5 MG tablet Take 2.5 mg by mouth daily.   Yes [provider]   naproxen (NAPROSYN) 250 MG tablet Take 250 mg by mouth daily.   Yes [provider]   venlafaxine (EFFEXOR) 75 MG tablet Take 75 mg by mouth 2 (two) times daily.   Yes [provider]      Scheduled Meds:  aspirin 81 mg Oral Once   atorvastatin 80 mg Oral Daily   carvedilol 6.25 mg Oral Q12H SCH   fluticasone-salmeterol 2 puff Inhalation BID   gabapentin 600 mg Oral Q8H SCH   hydrALAZINE 50 mg Oral Q8H SCH   polyethylene glycol 17 g Oral Daily   Senna 8.6 mg Oral  BID   tiotropium 18 mcg Inhalation QAM   venlafaxine 75 mg Oral Q24H   vitamin C 500 mg Oral Daily     Continuous Infusions:  . heparin 96045 units in dextrose 5% 500 mL 900 Units/hr (03/01/14 1000)   . nitroglycerin Stopped (02/28/14 2202)     PRN Meds:albuterol-ipratropium, dextrose, glucagon (rDNA), heparin (porcine), insulin aspart, LORazepam, oxyCODONE-acetaminophen  Review of Systems:   All other systems were reviewed and were negative except as noted in the HPI.  Physical Exam:   Vital Signs: BP 157/71 mmHg  Pulse 77  Temp(Src) 98.1 F (36.7 C) (Oral)  Resp 30  Ht 1.562 m (5' 1.5")  Wt 72.6 kg (160 lb 0.9 oz)  BMI 29.76 kg/m2  SpO2 98%   Intake/Output Summary (Last 24 hours) at 03/01/14 1005  Last data filed at 03/01/14 1000   Gross per 24 hour   Intake 703.88 ml   Output      0 ml   Net 703.88 ml     General Appearance:  Elderly woman, frail appearing, AO3  HEENT: Sclera anicteric, conjunctiva without pallor, moist mucous membranes, missing teeth, +arcus  Neck:  Supple, JVP 8 cm  Chest: Clear to auscultation bilaterally with good air movement and respiratory effort and no wheezes, rales, or rhonchi   Cardiovascular: RRR, 3/6 early peaking systolic murmur  Abdomen: Soft, nontender, nondistended, with normoactive bowel sounds. No organomegaly.  No pulsatile masses, or bruits.   Extremities: RLE bandaged  Skin: Normal coloration and turgor, no rash, xanthoma or xanthelasma.   Musculoskeletal: heberden's nodes  Neuro: Alert and oriented x3. Grossly intact. Strength is symmetrical. Normal mood and affect.   Cardiographics:   ECG: NSR, LAE, normal axis, anteroseptal MI  Echocardiogram:   LVEF 45%, mild diffuse HK  Moderate AS, mean gradient 12 mmHg, AVA 1.2 cm  Possible pulm HTN, insufficient TR to estimate PASP    Labs Reviewed:     Recent Labs      03/01/14   0551  02/28/14   1728   WBC  6.94  6.87   HEMOGLOBIN  9.1*  9.0*   HEMATOCRIT  29.9*  30.4*   PLATELETS  297  297     Recent Labs      02/28/14    2344  02/28/14   1728   TROPONIN I  16.96*  21.49*    Recent Labs      03/01/14   0551  02/28/14   1728   SODIUM  137  138   POTASSIUM  5.2*  4.8   CO2  21*  23   BUN  22.0*  24.0*   CREATININE  1.5*  1.3*   GLUCOSE  140*  138*   MAGNESIUM  2.6  1.9     No results for input(s): INR, TSH, CHOL, TRIG, HDL, LDL in the last 72 hours.     Rads:     Radiology Results (24 Hour)     Procedure Component Value Units Date/Time    XR Chest AP Portable [[id number]] Collected:  02/28/14 2134    Order Status:  Completed Updated:  02/28/14 2142    Narrative:      CLINICAL HISTORY: COPD, CAD. Evaluate for infiltrate.    FINDINGS: Portable upright AP view of the chest obtained. Comparison:  None    Overlying monitor leads. No focal infiltrate. No vascular congestion or  pleural effusion. Cardiac silhouette is upper normal. Atherosclerotic  calcification of the aorta.      Impression:       No acute disease.    Kennyth Lose, MD   02/28/2014 9:38 PM            Assessment:   1. CAD multivessel CAD with severe LM, mildly depressed LVEF 45%  2. Moderate Aortic Stenosis, AVA 1.2 cm2  3. Chronic RLE wound  4. CKD, CrCl improved  5. COPD, possibly severe. Bedside spirometry abnormal prob due to poor effort      Plan:   1. CV eval for CABG +/- AVR  2. Renal consult  3. ID consult appreciated for RLE wound  4. Keep heparin gtt  5. Nitro gtt titrated off due to headache  6. Cont aspirin  7. COPD bedside spirometer      Thank you for this consultation.     Signed by: Precious Gilding, MD, MD  [facility]  [facility] Spect[employer] [phone] or [phone] (M-F 8 am-5 pm)  Tyson Babinski [facility] Spectralink [phone]  (M-F 8 am-5 pm)  After Hours Non Urgent Consult Line: [phone]  After Hours Urgent Consults: [phone]  Office: [phone]    cc:No primary care provider on file.

## 2014-03-01 NOTE — Progress Notes (Signed)
ICU/CCU Daily Progress Note    Patient's Name: [name]    Room:  FI112/FI112-01  Attending Provider: Irene Pap, MD  Admit Date:02/28/2014  Medical Record Number: [id number]     Date/Time: 03/01/2014 5:55 AM  ara  63 year old female with PMH of CAD, left circumflex stent in jan 2015 and RCA stent, Diabetes, PAD with chronic right lower leg ulcer, and mild COPD, presented to [facility] with Syncope and NSTEMI, had Heart Cath which showed Triple Vessel Disease, transferred to [facility] for probable CABG in setting of active foot ulcer , had likely Type 4A NSTEMI due to PCI, troponins trending down.  Pulm and ID on board     24hr events:   nitro dripped stop, no symptoms overnight     Patient Active Problem List   Diagnosis   . NSTEMI (non-ST elevated myocardial infarction)   . Hypertensive urgency   . PAD (peripheral artery disease)   . Arterial leg ulcer   . Chronic obstructive pulmonary disease, unspecified COPD type          VITAL SIGNS PHYSICAL EXAM   Temp:  [97.7 F (36.5 C)-98.5 F (36.9 C)] 97.7 F (36.5 C)  Heart Rate:  [75-93] 90  Resp Rate:  [14-25] 15  BP: (137-191)/(63-88) 137/63 mmHg  Blood Glucose:  Pulse ox:  Telemetry:       Intake/Output Summary (Last 24 hours) at 03/01/14 0555  Last data filed at 03/01/14 0200   Gross per 24 hour   Intake 389.88 ml   Output      0 ml   Net 389.88 ml    Physical Exam  Cons : alert oriented times 3 NAD  Neuro: No focal deficits, CN-2-12 intact  HEENT: hematoma above left eye   Cardiac: systolic murmur at right upper sternal border, normal s1-s2,  Lungs: wheezes at bases , CTA bilaterally  Abdomen: non tender, non distended  QIO:NGEXB bandaged, chronic ischemic changes present in feet bilaterally   Skin: venous stasis changes bilaterally legs    Right femoral cath site with no evidence of hematoma, no pulsation, no swelling.        Scheduled Meds: PRN Meds:      aspirin 81 mg Oral Once   atorvastatin 80 mg Oral Daily   carvedilol 6.25 mg Oral Q12H SCH    fluticasone-salmeterol 2 puff Inhalation BID   gabapentin 600 mg Oral Q8H SCH   hydrALAZINE 50 mg Oral Q8H SCH   polyethylene glycol 17 g Oral Daily   Senna 8.6 mg Oral BID   tiotropium 18 mcg Inhalation QAM   venlafaxine 75 mg Oral Q24H   vitamin C 500 mg Oral Daily       Continuous Infusions:  . heparin 28413 units in dextrose 5% 500 mL 900 Units/hr (03/01/14 0200)   . nitroglycerin Stopped (02/28/14 2202)      albuterol-ipratropium 3 mL Q6H PRN   dextrose 25 mL PRN   glucagon (rDNA) 1 mg PRN   heparin (porcine) 3,000 Units PRN   insulin aspart 1-5 Units PRN   LORazepam 0.5 mg Q8H PRN   oxyCODONE-acetaminophen 1 tablet Q4H PRN           Labs (last 72 hours):  Recent Labs      02/28/14   1728   WBC  6.87   HEMOGLOBIN  9.0*   HEMATOCRIT  30.4*     Recent Labs      02/28/14   2252  02/28/14   1728  PTT  51*  34    Recent Labs      02/28/14   1728   SODIUM  138   POTASSIUM  4.8   CHLORIDE  108   CO2  23   BUN  24.0*   CREATININE  1.3*   GLUCOSE  138*   CALCIUM  8.8   MAGNESIUM  1.9   PHOSPHORUS  2.7                 Microbiology:   Microbiology Results     Procedure Component Value Units Date/Time    CULTURE BLOOD AEROBIC AND ANAEROBIC [[id number]] Collected:  02/28/14 2344    Specimen Information:  Blood, Venipuncture Updated:  03/01/14 0149    Narrative:      1 BLUE+1 PURPLE    CULTURE BLOOD AEROBIC AND ANAEROBIC [[id number]] Collected:  02/28/14 2345    Specimen Information:  Blood, Venipuncture Updated:  03/01/14 0033    Narrative:      1 BLUE+1 PURPLE    MRSA culture [[id number]] Collected:  02/28/14 1657    Specimen Information:  Body Fluid / Nares and Throat Updated:  02/28/14 1944            Imaging:  Xr Chest Ap Portable    02/28/2014    No acute disease.  Kennyth Lose, MD  02/28/2014 9:38 PM         Assessment and Plan:    #NSTEMI VS Type 4A MI:   -No EKG changes, no chest pain. Isolated troponin bump from 0.15 to 21 following angiography 12/14  - troponins trending down 21->16  -Coreg 12.5  -  continue  aspirin,    -continue low intensity heparin dri   - continue atorvastatin 80,   - hold ACE inhibitor with recovering AKI hold  - hold clopidogrel with possible coronary artery bypass graft this week.   -Nitro drip discontinued due to headache    #AKI 2/2 Ace Inhibitor/Prerenal    - holding Ace Inhibitor/lasix     - creat currently 1.5, potassium 5.2     - will check K this afternoon     # Multivessel CAD: 1. Distal left main, 60 % stenosis, ostial. 2. Ostial circumflex, 80 % stenosis, ulcerated. 3. 3rd OM with ostial lesion. 4 100 % mid RCA with left to right collaterals; Chronically occluded right coronary artery stent (per Angio 06/2013).   --cardiothoracic surgery to evaluate potential for coronary artery bypass graft.     #PAD w/ chronic -medial right lower extremity arterial ulcer-status post angioplasty in April 2015.   - Wound culture from 12/13 at [facility] indicates many mixed flora with no predominating organism.   -Wound Care Nurse Consulted   -ID consulted to make recs regarding CABG, feel no reason to delay surgery as no infection, suggest Vanc and Cefipime in perioperative phase     #hypertension:    - BP between 140-150 systolic   - hydralazine 50mg /q8  -Coreg 12.5/q12..    #type 2 diabetes: Insulin-dependent. H A1c = 7.1 on 12/12.       - sliding scale per home regimen       - no need for basal insulin at this time     #COPD:    - pulm requested repeat PFTs to determine surgery risk, asserts COPD is mild and is not a contraindication to delay CABG   - CXR 12/14 showed no acute findings    - initially wheezy on physical exam, patient had  no been taking her meds at home   . Resumed Spiriva and Symbicort equivalent while in hospital    Q 6 Duonebs at present.     #asymptomatic bacteriuria.   Greater than 100,000 colony-forming units GNR at [facility].patient    - no symptoms  - no need to treat currently    # Anemia:        - Iron Profile shows low iron and low iron sat      - Nephro is going to start venofer  and aranesp      - due to chronic disease and iron deficiency    #anxiety    -restarted on Venlafaxine per her home dose    #FEN: Cardiac diet, gentle IV fluid hydration       Prophylaxis:  - DVT:  Heparin   - GI:  None     Code Status: FULL    Dispo: CABG     Lines/Drains/Airways:      Safety Checklist:     DVT prophylaxis:  CHEST guideline (See page [id number]) Chemical and Mechanical   Foley: Not present   IVs:  Peripheral IV   PT/OT: Ordered   Daily CBC & or Chem ordered:  SHM/ABIM guidelines (see #5) Yes, due to clinical and lab instability   Reference for charges of common labs: CBC auto diff - $76  BMP - $99  Mg - $79        Signed by: Claud Kelp, DO  Date/Time: 03/01/2014 5:55 AM

## 2014-03-01 NOTE — Progress Notes (Signed)
CV Surgery NP with Dr. Thedore Mins.  Patient with NSTEMI, CKD, COPD transferred from [facility] for CABG evaluation.  Patient failed initial bedside spirometry and has been evaluated by Pulmonary.  Patient will have full PFT tomorrow.  Will await PFT results for review.  Patient seen today by Dr. Thedore Mins and myself.  She has been advised that we are awaiting additional testing prior to CV Surgery. Will continue to follow.

## 2014-03-01 NOTE — Progress Notes (Signed)
[facility], [employer].  Progress Note    Assessment:  1.  NSTEMI/3 vessel CAD (cardiac cath @ [facility]) -> transfer from [facility] for CABG consideration  2.  Acute on CRF; non oliguric; multifactorial etiology; stable  3.  Metabolic acidosis (mild); non anion gap  4.  Anemia; normocytic/mildly hypochromic      Plan:  1.  Low level IVFs: 1/2ns + NaHCO3 65meq/L @ 50cc/hr  2.  Titrate Coreg from 6.25 -> 12.5/12.5mg   3.  Aranesp/iron studies    Further recommendations to follow      Thank you    Louellen Molder, MD  Office - [phone]  [employer] - [phone]  ++++++++++++++++++++++++++++++++++++++++++++++++++++++++++++++  Subjective:  No new complaints    Medications:  Scheduled Meds:  Current Facility-Administered Medications   Medication Dose Route Frequency   . aspirin  81 mg Oral Once   . atorvastatin  80 mg Oral Daily   . carvedilol  12.5 mg Oral Q12H SCH   . carvedilol  6.25 mg Oral Once   . darbepoetin alfa (ARANESP) injection  100 mcg Subcutaneous Weekly   . fluticasone-salmeterol  2 puff Inhalation BID   . gabapentin  600 mg Oral Q8H SCH   . hydrALAZINE  50 mg Oral Q8H SCH   . polyethylene glycol  17 g Oral Daily   . Senna  8.6 mg Oral BID   . tiotropium  18 mcg Inhalation QAM   . venlafaxine  75 mg Oral Q24H   . vitamin C  500 mg Oral Daily     Continuous Infusions:  . heparin 57846 units in dextrose 5% 500 mL 900 Units/hr (03/01/14 1000)   . nitroglycerin Stopped (02/28/14 2202)   . custom IV infusion builder       PRN Meds:albuterol-ipratropium, dextrose, glucagon (rDNA), heparin (porcine), insulin aspart, LORazepam, oxyCODONE-acetaminophen    Objective:  Vital signs in last 24 hours:  Temp:  [97.7 F (36.5 C)-98.5 F (36.9 C)] 98.1 F (36.7 C)  Heart Rate:  [75-93] 75  Resp Rate:  [14-25] 19  BP: (137-191)/(63-88) 166/74 mmHg  Intake/Output last 24 hours:    Intake/Output Summary (Last 24 hours) at 03/01/14 1133  Last data filed at 03/01/14 1000   Gross per 24 hour    Intake 703.88 ml   Output      0 ml   Net 703.88 ml     Intake/Output this shift:  I/O this shift:  In: 174 [P.O.:120; I.V.:54]  Out: -     Physical Exam:   Gen: WD WN NAD   CV: S1 S2 N RRR   Chest: No rales/no rhonchi   Ab: ND NT soft no HSM +BS   Ext: No edema    Labs:    Recent Labs  Lab 03/01/14  0551 02/28/14  1728   GLUCOSE 140* 138*   BUN 22.0* 24.0*   CREATININE 1.5* 1.3*   CALCIUM 9.2 8.8   SODIUM 137 138   POTASSIUM 5.2* 4.8   CHLORIDE 108 108   CO2 21* 23   ALBUMIN  --  2.7*   PHOSPHORUS 3.2 2.7   MAGNESIUM 2.6 1.9       Recent Labs  Lab 03/01/14  0551 02/28/14  1728   WBC 6.94 6.87   HEMOGLOBIN 9.1* 9.0*   HEMATOCRIT 29.9* 30.4*   MCV 81.7 83.1   MCH, POC 24.9* 24.6*   MCHC 30.4* 29.6*   RDW 18* 17*   MPV 10.0  10.0   PLATELETS 297 297

## 2014-03-01 NOTE — Plan of Care (Signed)
Pt anxious and tearful overnight, improved with re-assurance and scheduled anti-anxiety meds. Afebrile, denies chest pain. Nitro gtt titrated off. Heparin gtt continue, Q6h aptt drawn. Q6hr trops down trending. SBP 130-160's overnight, scheduled hydralazine given. Pt remains on 2LNC, SpO2 98-100% Wound consult placed for right foot ulcer. Cardiac surgery today for possible CABG? Awaiting Echo.    Pt safety and comfort maintained this shift.

## 2014-03-01 NOTE — Plan of Care (Signed)
Patient A&Ox4, no complaints of pain except slight headache, PRN percocet given with response. Afebrile for shift. SR on telemetry with BP high; CCU and nephrology aware. Coreg dose increased, continuing to monitor. Carotid, venous dopplers completed. Echo completed. Troponins downtrending x3. Heparin gtt per low intensity protocol. C/V consult today (see note). O2sats adequate on 2L NC, bedside spirometry ordered after pulmonology consult.  Voiding to bedpan and commode, no coverage needed for BG, tolerating PO intake well. Nephro started bicarb gtt. BM today x1. ID and wound consult completed (see orders for dressings). No falls or injuries this shift.    Plan: monitor VS, pain, ECG. Heparin gtt per low intensity protocol, next APTT at 0130. Daily dressing changes for RLE wound. Continue fall and safety precautions. Possible CABG pending C/V recs and spirometry results.

## 2014-03-01 NOTE — PT Eval Note (Addendum)
[facility]   Physical Therapy Evaluation     Patient: [name]    MRN#: [id number]   [facility]  Bed: FI112/FI112-01    Discharge Recommendations:   Discharge Recommendation:  Most likely home Home with supervision, Home with home health PT pending possible sx    DME Recommendation: transport  chair    If Home with supervision, Home with home health PT recommended discharge disposition is not available, patient will need rehab.      Assessment:   [name] is a 63 y.o. female admitted 02/28/2014 with NSTEMI, CKD, COPD. Work-up for CABG. Will see for PT  To increase independence w/ functional mobility    Therapy Diagnosis: impaired functional mobility    Rehabilitation Potential: good    Treatment Activities: eval, transfer, ex  Educated the patient to role of physical therapy, plan of care, goals of therapy and safety with mobility and ADLs, energy conservation techniques.    Plan:   PT Frequency: 2-3x/wk    Treatment/Interventions: gt, ex, transfers, balance, bed mobility    Risks/benefits/POC discussed pt       Precautions and Contraindications:   Falls, R foot wound    Consult received for [name] for PT Evaluation and Treatment.  Patient's medical condition is appropriate for Physical Therapy intervention at this time.    History of Present Illness:   [name] is a 63 y.o. female admitted on 02/28/2014 with  Multi-vessel coronary  Disease from [facility]. Work up for  Possible CABG.    Medical Diagnosis: NSTEMI (non-ST elevated myocardial infarction) [I21.4]  NSTEMI (non-ST elevated myocardial infarction) [I21.4]    Past Medical/Surgical History:  DM,PAD, HTN, CKD, CAD-R RCA stent/NSTEMI,COPD, anxiety, RLE has chronic leg wound since spring 2015. Had angioplasty of R SFA and Tibial-peroneal trunk (June 30 2013)     Imaging/Tests/Labs:  IMPRESSION: CXR 02-28-14  No acute disease    IMPRESSION: XR Tibia Fibula Right AP And Lateral  03-01-14  1. No  findings of osteomyelitis involving the shafts of the tibia or  fibula.  2. 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  Further evaluation of these regions should be guided by clinical  Parameters    Troponin: 21.49, 16.96     ECHO 03-01-14  LEFT VENTRICLE: Size was normal. Ejection fraction was estimated to be 45 %. There was mild diffuse hypokinesis. Wall  thickness was increased. Doppler: There was an increased relative contribution of atrial contraction to ventricular  filling.  AORTIC VALVE: The valve was trileaflet. Leaflets exhibited moderately increased thickness, moderate calcification, and  moderately reduced cuspal separation. Doppler: There was moderate stenosis. There was no regurgitation.  AORTA: The root exhibited normal size.  MITRAL VALVE: There was mild annular calcification. There was minimal thickening. There was normal leaflet separation.  Doppler: There was trivial regurgitation.  LEFT ATRIUM: The atrium was dilated.  ATRIAL SEPTUM: No defect or patent foramen ovale was identified.  RIGHT VENTRICLE: The size was normal. Systolic function was normal. Wall thickness was normal. Doppler: The tricuspid  jet envelope definition was inadequate for estimation of RV systolic pressure. There are no indirect findings (abnormal  RV volume or geometry, altered pulmonary flow velocity profile, or leftward septal displacement) which would suggest  moderate or severe pulmonary hypertension.  PULMONIC VALVE: Not well visualized.  PULMONARY ARTERY: Not well visualized.  TRICUSPID VALVE: The  valve structure was normal. There was normal leaflet separation. Doppler: The transtricuspid  velocity was within the normal range. There was no evidence for tricuspid stenosis. There was no regurgitation.  RIGHT ATRIUM: Size was normal.  SYSTEMIC VEINS: IVC: The inferior vena cava was normal in size and course. Respirophasic  changes were normal.  PERICARDIUM: There was no pericardial effusion. The pericardium was normal in appearance  Social History:   Prior Level of Function: modified independent  W/ gt and adl's  Assistive Devices: rw  Baseline Activity: household  DME Currently at Home: rw,cane, shower seat  Home Living Arrangements: daughters  Type of Home: townhouse  Home Layout: one level set up/steps to enter    Subjective:   Patient is agreeable to participation in the therapy session. Nursing clears patient for therapy.     Patient Goal: feel better    Pain:   Scale: controlled   Location: R foot  Intervention: position of comfort    Objective:   Patient is in bed with telemetry/CCU monitors, iv, O2 at 2 L in place. R foot w/ dressing/ace wrap      Cognitive Status and Neuro Exam:  Awake, orientated x 3, follows commands    Musculoskeletal Examination  RUE ROM: wfl  LUE ROM: wfl  RLE ROM: wfl hip/knee  LLE ROM: wfl    RUE Strength: 4-/5  LUE Strength: 4-/5  RLE Strength: 3+/5 hip/knee  LLE Strength: 3+/5    Functional Mobility  Rolling: cga  Supine to Sit: min/cga w/ increase time  Scooting: cga  Sit to Stand: cga/min  Stand to Sit: cga  Transfers: cga/min    Ambulation  Level of assistance required: min  Ambulation Distance: 3 ft x 1  Pattern: decrease step length R LE, unsteady-no loss balance  Device Used: rw  Weightbearing Status: wbat    Balance  Static Sitting: good  Dynamic Sitting: good-  Static Standing: good- w/ rw  Dynamic Standing: fair+ w/ rw    Participation and Activity Tolerance  Participation Effort: good  Endurance: fair    Patient left with call bell within reach, all needs met, SCDs on and all questions answered. RN notified of session outcome and patient response.     Goals:  Goals  Pt Will Perform Sit To Supine: modified independent  Pt Will Transfer Bed/Chair: with rolling walker, modified independent  Pt Will Ambulate: 51-100 feet, with rolling walker, with supervision      Georgeanne Nim PT# [id number]    Time  of Treatment  PT Received On: 03/01/14  Start Time: 1310  Stop Time: 1400  Time Calculation (min): 50 min

## 2014-03-01 NOTE — Consults (Signed)
ID CONSULTATION    Date Time: 03/01/2014 9:59 AM  Patient Name: [name],[name]  Requesting Physician: Roxana Hires, MD       Reason for Consultation:   RLE wound ;Clearance for surgery    Problem List:   Acute Problem List:   Right lower extremity wound; chronic; currently on  no antibiotics ; cx at [facility] ; mixed organisms  S/P angioplasty 06/2013  Asymptomatic bacteruria ; cx GNR at [facility]  Allergy PCN: angioedema    Chronic Conditions:  Diabetes mellitus type 2  Severe peripheral artery disease  CAD; S/P stent  Hypertension   CKD  COPD  Anemia      Assessment:   Chronic RLE wound in setting of  PVD and DM; clinically no active infection but wound does have some exudate. Cx at [facility] showed no growth.She has no fever or leukocytosis. She also denies dysuria or flank pain.  Blood cx pending  MRSA screen negative    Recommendations:   No contraindication for CABG  In terms of perioperative antibiotics would use Vancomycin 1 gm times 1 and Cefepime 2 gm times one ; no need for ongoing antibiotic therapy at this time  Agree with wound care evaluation and ongoing wound management  Xray Right leg to rule out osteomyelitis  Regarding asymptomatic bacteruria, no need to recheck urine as she is still asymptomatic    D/ w pt in detail  D/w CCU team    _______________________________________________________________________  I have discussed my thoughts with the patient and with relevant medical team members I have considered the potential drug interactions between antimicrobial agents   I have recommended and other medications required by the patient and adjusted appropriately for renal function   Thank you for allowing me to participate in the care of this very interesting and pleasant patient    History:   [name] is a 63 y.o. female , transfer from [place] who presents to the hospital on 02/28/2014 with elevated troponins,evidence of multivessel disease, prior, RCA stent and need for CABG. She has a right LE wound  chronic ; S/P right sided angioplasty 06/2013 . She also had a positive urine cx at [facility] but has been asymptomatic.  Past Medical History:     Past Medical History   Diagnosis Date   . Diabetes mellitus    . Chronic kidney disease    . Myocardial infarction    . Hypertension    . Hyperlipidemia    . Peripheral arterial disease      06/28/13 right superficial femoral artery stenosis, tibial peroneal trunk stenosis, left CVL a stenosis, treated with angiography and angioplasty.   . Coronary artery disease      Left circumflex artery presumed DES.,  Chronically occluded right coronary artery stent   . Anemia      She redid to chronic disease and iron deficiency.     . Arterial leg ulcer      2014, receiving home health   . Chronic obstructive pulmonary disease      Previously on Spiriva and Advair       Past Surgical History:     Past Surgical History   Procedure Laterality Date   . Cardiac angiography and angioplasty  06/2013   . Colonoscopy  Unknown   . Tubal ligation         Family History:     Family History   Problem Relation Age of Onset   . Coronary artery disease Mother  Died at 34 from MI.   Marland Kitchen Coronary artery disease Father      Died at 11 from MI   . Diabetes type II       Mother and father       Social History:     History     Social History   . Marital Status: Widowed     Spouse Name: N/A     Number of Children: N/A   . Years of Education: N/A     Social History Main Topics   . Smoking status: Former Smoker -- 1.00 packs/day for 20 years     Quit date: 02/29/2012   . Smokeless tobacco: Not on file   . Alcohol Use: Not on file   . Drug Use: Not on file   . Sexual Activity: Not on file     Other Topics Concern   . Not on file     Social History Narrative   . No narrative on file       Allergies:     Allergies   Allergen Reactions   . Penicillins Anaphylaxis and Angioedema       Lines:       Medications:     Current Facility-Administered Medications   Medication Dose Route Frequency   . aspirin  81 mg Oral  Once   . atorvastatin  80 mg Oral Daily   . carvedilol  6.25 mg Oral Q12H SCH   . fluticasone-salmeterol  2 puff Inhalation BID   . gabapentin  600 mg Oral Q8H SCH   . hydrALAZINE  50 mg Oral Q8H SCH   . polyethylene glycol  17 g Oral Daily   . Senna  8.6 mg Oral BID   . tiotropium  18 mcg Inhalation QAM   . venlafaxine  75 mg Oral Q24H   . vitamin C  500 mg Oral Daily       Antibiotics:       Review of Systems:   General ROS: negative for - chills, fevers, night sweats, weight loss   HEENT: negative for - blurry vision, sore throat, thrush   Respiratory ROS: negative for cough, SOB  Cardiovascular ROS: negative for - chest pain, palpitations   Gastrointestinal ROS: negative for - abdominal pain, nausea, vomiting, diarrhea  Genito-Urinary ROS: negative for - dysuria, urinary frequency/urgency   Musculoskeletal ROS: negative for - joint pain, joint stiffness or muscle pain   Dermatological ROS: negative for - rash and skin lesion changes   Neurological ROS: negative for - confusion, headache, dizziness  Hematological ROS: negative for - bruising, bleeding   Psychological ROS: negative for - changes in mood      Physical Exam:     Filed Vitals:    03/01/14 0900   BP: 151/66   Pulse: 81   Temp: 98.5 F   Resp: 14   SpO2: 100%       General Appearance: alert and appropriate, non-toxic  Neuro: alert, oriented, normal speech, normal attention and cognition    HEENT: no scleral icterus, pupils round and reactive, OP clear  Neck: supple, no significant adenopathy   Lungs: clear to auscultation, no wheezes, rales or rhonchi, symmetric air entry   Cardiac: normal rate, regular rhythm, normal S1, S2,   Abdomen: soft, non-tender, non-distended, normal active bowel sounds  Extremities: RLE wound with some exudative drainage; minimal odor but no surrounding erythema or purulent drainage  Skin: no rash    Labs:  Lab Results   Component Value Date    WBC 6.94 03/01/2014    HGB 9.1* 03/01/2014    HCT 29.9* 03/01/2014    MCV 81.7  03/01/2014    PLT 297 03/01/2014     Lab Results   Component Value Date    CREAT 1.5* 03/01/2014     Lab Results   Component Value Date    ALT 13 02/28/2014    AST 81* 02/28/2014    ALKPHOS 64 02/28/2014    BILITOTAL 0.1* 02/28/2014     No results found for: LACTATE    Microbiology:     Microbiology Results     Procedure Component Value Units Date/Time    CULTURE BLOOD AEROBIC AND ANAEROBIC [[id number]] Collected:  02/28/14 2344    Specimen Information:  Blood, Venipuncture Updated:  03/01/14 0149    Narrative:      1 BLUE+1 PURPLE    CULTURE BLOOD AEROBIC AND ANAEROBIC [[id number]] Collected:  02/28/14 2345    Specimen Information:  Blood, Venipuncture Updated:  03/01/14 0033    Narrative:      1 BLUE+1 PURPLE    MRSA culture [[id number]] Collected:  02/28/14 1657    Specimen Information:  Body Fluid / Nares and Throat Updated:  02/28/14 1944          Rads:   Xr Chest Ap Portable    02/28/2014    No acute disease.  Kennyth Lose, MD  02/28/2014 9:38 PM       Signed by: Dorena Cookey, MD

## 2014-03-01 NOTE — Consults (Signed)
WOC RN NOTE:    Asked to see [name] re: local care for a chronic RLE arterial ulcer.    Pt admitted from [facility] with multi vessel CAD for surgical consult for CABG.    Hx:DM2, severe peripheral arterial disease, CAD s/p stent, HTN, CKD, COPD, anemia.    Pt tells me she has been treating this ulcer for over 2 years. She was treated at a wound center at [facility] prior to moving to this area.    Exam shows a chronic wound located on the anterio-medial aspect of the R foot.  The wound measures aprox 10 cm x 3.5 cm x /3 cm. The wound bed is 50% granular and pink and 50% intact but soft Cecena eschar. The edges have light pink epithelial tissue. No bleeding noted  with cleansing. There was a faint odor which cleared with cleansing. There was no edema or surrounding  induration or fluctuance. The wound does not appear to be infected.    Recommend: Continue debridement with Thera Honey gel with daily dressing changes. Will assist with autolytic debridement to promote healing.    D/W bedside RN and care instructions were entered into EMR.  Dressing was changed and supplies are at the bedside.    [address] RN, [place]  [address]

## 2014-03-01 NOTE — Consults (Signed)
CONSULTATION    Date Time: 03/01/2014 10:42 AM  Patient Name: [name],[name]  Requesting Physician: Roxana Hires, MD      Reason for Consultation:   COPD, Pulmonary Clearance pre CABG    Assessment:   COPD  Wheezing reported per history, no active wheezing  Spirometry/PFT Abnormality  Multivessel CAD  PVD    Plan:   -Agree with Spiriva and Advair for now  -No need for PO Steroids  -Spirometry is of poor quality, and it appears she had difficulty with maneuver.  With no active wheezing and major abnormalities on CXR, I doubt FEV1 is truly 0.57L.  Will get full PFT's for better evaluation of pulmonary status  -1 day prior to surgery and post-op, would stop Spiriva and place on Around the clock DuoNebs for further optimization  -Will verify with PFT's, but at this point I do not see a Pulmonary reason to hold surgery    History:   [name] is a 63 y.o. female who presents to the hospital on 02/28/2014 with transfer from [facility] for evaluation for cardiac surgery.  She presented to [facility] after a syncopal episode that occurred after she was eating breakfast.  She thinks she took too many pills of her antihypertensive medication/diuretic to cause it, and her SBP was in 60's on EMS arrival.  She had NSTEMI noted, and her cardiac catheterization revealed multivessel CAD, so she was transferred here for further workup. She is also being treated for asymptomatic bacteriuria and iron deficiency anemia.  Pulmonary was consulted after a bedside spirometry showed very low FEV to 33% predicted.    Spirometry showed FEV1=0.57 (33%), FVC=0.99 (45%), FEV1/FVC=58.  However, tracings are poor quality.     Patient states she was diagnosed with COPD approximately 1 year ago.  She was given Spiriva and Symbicort, both of which she was instructed to use on a PRN basis.  She uses both of these meds approximately every 2 months, and the last time she used either was 1 month ago.  She estimates that she has used inhalers  a total of 6 times this year.      Per the notes, she was wheezing on exam following the transfer from [facility].    Patient states she does not get wheezing episodes at home on a normal basis.  She does get coughing episodes however.  She does endorse a baseline DOE for 6 to 10 steps.      Past Medical History:     Past Medical History   Diagnosis Date   . Diabetes mellitus    . Chronic kidney disease    . Myocardial infarction    . Hypertension    . Hyperlipidemia    . Peripheral arterial disease      06/28/13 right superficial femoral artery stenosis, tibial peroneal trunk stenosis, left CVL a stenosis, treated with angiography and angioplasty.   . Coronary artery disease      Left circumflex artery presumed DES.,  Chronically occluded right coronary artery stent   . Anemia      She redid to chronic disease and iron deficiency.     . Arterial leg ulcer      2014, receiving home health   . Chronic obstructive pulmonary disease      Previously on Spiriva and Advair       Past Surgical History:     Past Surgical History   Procedure Laterality Date   . Cardiac angiography and angioplasty  06/2013   .  Colonoscopy  Unknown   . Tubal ligation         Family History:     Family History   Problem Relation Age of Onset   . Coronary artery disease Mother      Died at 59 from MI.   Marland Kitchen Coronary artery disease Father      Died at 3 from MI   . Diabetes type II       Mother and father       Social History:     History     Social History   . Marital Status: Widowed     Spouse Name: N/A     Number of Children: N/A   . Years of Education: N/A     Social History Main Topics   . Smoking status: Former Smoker -- 1.00 packs/day for 20 years     Quit date: 02/29/2012   . Smokeless tobacco: Not on file   . Alcohol Use: Not on file   . Drug Use: Not on file   . Sexual Activity: Not on file     Other Topics Concern   . Not on file     Social History Narrative   . No narrative on file       Allergies:     Allergies   Allergen Reactions   .  Penicillins Anaphylaxis and Angioedema       Medications:     Current Facility-Administered Medications   Medication Dose Route Frequency   . aspirin  81 mg Oral Once   . atorvastatin  80 mg Oral Daily   . carvedilol  6.25 mg Oral Q12H SCH   . fluticasone-salmeterol  2 puff Inhalation BID   . gabapentin  600 mg Oral Q8H SCH   . hydrALAZINE  50 mg Oral Q8H SCH   . polyethylene glycol  17 g Oral Daily   . Senna  8.6 mg Oral BID   . tiotropium  18 mcg Inhalation QAM   . venlafaxine  75 mg Oral Q24H   . vitamin C  500 mg Oral Daily       Review of Systems:   A comprehensive review of systems was: Negative except 12 point ROS negative except as noted above in HPI    Physical Exam:     Filed Vitals:    03/01/14 1000   BP: 157/71   Pulse: 77   Temp:    Resp: 30   SpO2: 98%   Tm=98.1    Intake and Output Summary (Last 24 hours) at Date Time    Intake/Output Summary (Last 24 hours) at 03/01/14 1042  Last data filed at 03/01/14 1000   Gross per 24 hour   Intake 703.88 ml   Output      0 ml   Net 703.88 ml       General appearance - alert, well appearing, and in no distress  Mental status - alert, oriented to person, place, and time  Eyes - pupils equal and reactive, extraocular eye movements intact  Neck - supple, no significant adenopathy  Chest - NO WHEEZING, trace b/l crackles  Heart - normal rate and regular rhythm  Abdomen - soft, nontender, nondistended, no masses or organomegaly  Neurological - alert, oriented, normal speech, no focal findings or movement disorder noted  Musculoskeletal - no joint tenderness, deformity or swelling  Extremities - peripheral pulses normal, no pedal edema, no clubbing or cyanosis, RLE in pressure dressing for wound  Skin - normal coloration and turgor, no rashes, no suspicious skin lesions noted    Labs Reviewed:     Results     Procedure Component Value Units Date/Time    Hemolysis index [[id number]] Collected:  03/01/14 0551     Hemolysis Index 2 Updated:  03/01/14 0814    IRON PROFILE  [[id number]]  (Abnormal) Collected:  03/01/14 0551     Iron 33 (L) ug/dL Updated:  78/29/56 2130     UIBC 232 ug/dL      TIBC 865 ug/dL      Iron Saturation 12 (L) %     GFR [[id number]] Collected:  03/01/14 0551     EGFR 34.9 Updated:  03/01/14 0810    Basic Metabolic Panel [[id number]]  (Abnormal) Collected:  03/01/14 0551    Specimen Information:  Blood Updated:  03/01/14 0809     Glucose 140 (H) mg/dL      BUN 10.2 (H) mg/dL      Creatinine 1.5 (H) mg/dL      CALCIUM 9.2 mg/dL      Sodium 725 mEq/L      Potassium 5.2 (H) mEq/L      Chloride 108 mEq/L      CO2 21 (L) mEq/L     Magnesium [[id number]] Collected:  03/01/14 0551    Specimen Information:  Blood Updated:  03/01/14 0809     Magnesium 2.6 mg/dL     Phosphorus [[id number]] Collected:  03/01/14 0551    Specimen Information:  Blood Updated:  03/01/14 0809     Phosphorus 3.2 mg/dL     Glucose Whole Blood - POCT [[id number]]  (Abnormal) Collected:  03/01/14 0726     POCT - Glucose Whole blood 127 (H) mg/dL Updated:  88/41/66 0630    Type and Screen [[id number]] Collected:  03/01/14 0547    Specimen Information:  Blood Updated:  03/01/14 0737     ABO Rh A POS      AB Screen Gel NEG     CBC and differential [[id number]]  (Abnormal) Collected:  03/01/14 0551    Specimen Information:  Blood / Blood Updated:  03/01/14 0635     WBC 6.94 x10 3/uL      RBC 3.66 (L) x10 6/uL      Hgb 9.1 (L) g/dL      Hematocrit 42.7 (L) %      MCV 81.7 fL      MCH 24.9 (L) pg      MCHC 30.4 (L) g/dL      RDW 18 (H) %      Platelets 297 x10 3/uL      MPV 10.0 fL      Neutrophils 47 %      Lymphocytes Automated 27 %      Monocytes 14 %      Eosinophils Automated 11 %      Basophils Automated 0 %      Immature Granulocyte 0 %      Nucleated RBC 0 /100 WBC      Neutrophils Absolute 3.23 x10 3/uL      Abs Lymph Automated 1.90 x10 3/uL      Abs Mono Automated 1.00 x10 3/uL      Abs Eos Automated 0.78 (H) x10 3/uL      Absolute Baso Automated 0.02 x10 3/uL      Absolute Immature Granulocyte 0.01  x10 3/uL     APTT [[id number]]  (Abnormal) Collected:  03/01/14 0551  PTT 68 (H) sec Updated:  03/01/14 0981     APTT Anticoag. Given w/i 48 hrs. heparin     Glucose Whole Blood - POCT [[id number]]  (Abnormal) Collected:  03/01/14 0308     POCT - Glucose Whole blood 137 (H) mg/dL Updated:  62/13/08 6578    CULTURE BLOOD AEROBIC AND ANAEROBIC [[id number]] Collected:  02/28/14 2344    Specimen Information:  Blood, Venipuncture Updated:  03/01/14 0149    Narrative:      1 BLUE+1 PURPLE    Troponin I [[id number]]  (Abnormal) Collected:  02/28/14 2344    Specimen Information:  Blood Updated:  03/01/14 0115     Troponin I 16.96 (H) ng/mL     Narrative:      Notify MD for Troponin value of greater than 1.0  Q48H  Every 6 hours after heparin initiation/rate change then every  12 hours once stable per low intensity protocol    APTT [[id number]]  (Abnormal) Collected:  02/28/14 2252     PTT 51 (H) sec Updated:  03/01/14 0053     APTT Anticoag. Given w/i 48 hrs. heparin     CULTURE BLOOD AEROBIC AND ANAEROBIC [[id number]] Collected:  02/28/14 2345    Specimen Information:  Blood, Venipuncture Updated:  03/01/14 0033    Narrative:      1 BLUE+1 PURPLE    Glucose Whole Blood - POCT [[id number]]  (Abnormal) Collected:  02/28/14 2041     POCT - Glucose Whole blood 146 (H) mg/dL Updated:  29/51/88 4166    MRSA culture [[id number]] Collected:  02/28/14 1657    Specimen Information:  Body Fluid / Nares and Throat Updated:  02/28/14 1944    Comprehensive metabolic panel [[id number]]  (Abnormal) Collected:  02/28/14 1728    Specimen Information:  Blood Updated:  02/28/14 1808     Glucose 138 (H) mg/dL      BUN 20.2 (H) mg/dL      Creatinine 1.3 (H) mg/dL      Sodium 542 mEq/L      Potassium 4.8 mEq/L      Chloride 108 mEq/L      CO2 23 mEq/L      CALCIUM 8.8 mg/dL      Protein, Total 7.0 g/dL      Albumin 2.7 (L) g/dL      AST (SGOT) 81 (H) U/L      ALT 13 U/L      Alkaline Phosphatase 64 U/L      Bilirubin, Total 0.1 (L) mg/dL      Globulin  4.3 (H) g/dL      Albumin/Globulin Ratio 0.6 (L)     Narrative:      Starting for 1 Occurrences    Magnesium [[id number]] Collected:  02/28/14 1728    Specimen Information:  Blood Updated:  02/28/14 1808     Magnesium 1.9 mg/dL     Narrative:      Starting for 1 Occurrences    Phosphorus [[id number]] Collected:  02/28/14 1728    Specimen Information:  Blood Updated:  02/28/14 1808     Phosphorus 2.7 mg/dL     Narrative:      Starting for 1 Occurrences    GFR [[id number]] Collected:  02/28/14 1728     EGFR 41.1 Updated:  02/28/14 1808    Narrative:      Starting for 1 Occurrences    Troponin I [[id number]]  (Abnormal) Collected:  02/28/14 1728    Specimen Information:  Blood  Updated:  02/28/14 1806     Troponin I 21.49 (H) ng/mL     Narrative:      Starting for 1 Occurrences    APTT [[id number]] Collected:  02/28/14 1728     PTT 34 sec Updated:  02/28/14 1754    Narrative:      Starting for 1 Occurrences    CBC and differential [[id number]]  (Abnormal) Collected:  02/28/14 1728    Specimen Information:  Blood / Blood Updated:  02/28/14 1746     WBC 6.87 x10 3/uL      RBC 3.66 (L) x10 6/uL      Hgb 9.0 (L) g/dL      Hematocrit 78.2 (L) %      MCV 83.1 fL      MCH 24.6 (L) pg      MCHC 29.6 (L) g/dL      RDW 17 (H) %      Platelets 297 x10 3/uL      MPV 10.0 fL      Neutrophils 52 %      Lymphocytes Automated 28 %      Monocytes 11 %      Eosinophils Automated 8 %      Basophils Automated 0 %      Immature Granulocyte 0 %      Nucleated RBC 0 /100 WBC      Neutrophils Absolute 3.59 x10 3/uL      Abs Lymph Automated 1.92 x10 3/uL      Abs Mono Automated 0.76 x10 3/uL      Abs Eos Automated 0.57 x10 3/uL      Absolute Baso Automated 0.01 x10 3/uL      Absolute Immature Granulocyte 0.02 x10 3/uL     Narrative:      Starting for 1 Occurrences          Rads:   Radiological Procedure reviewed.     Xr Chest Ap Portable    02/28/2014    No acute disease.  Kennyth Lose, MD  02/28/2014 9:38 PM     Signed by: Oley Balm

## 2014-03-02 ENCOUNTER — Ambulatory Visit: Payer: BLUE CROSS/BLUE SHIELD

## 2014-03-02 LAB — CBC AND DIFFERENTIAL
Basophils Absolute Automated: 0.02 10*3/uL (ref 0.00–0.20)
Basophils Automated: 0 %
Eosinophils Absolute Automated: 0.95 10*3/uL — ABNORMAL HIGH (ref 0.00–0.70)
Eosinophils Automated: 15 %
Hematocrit: 27.7 % — ABNORMAL LOW (ref 37.0–47.0)
Hgb: 8.1 g/dL — ABNORMAL LOW (ref 12.0–16.0)
Immature Granulocytes Absolute: 0.01 10*3/uL
Immature Granulocytes: 0 %
Lymphocytes Absolute Automated: 2.23 10*3/uL (ref 0.50–4.40)
Lymphocytes Automated: 35 %
MCH: 24.4 pg — ABNORMAL LOW (ref 28.0–32.0)
MCHC: 29.2 g/dL — ABNORMAL LOW (ref 32.0–36.0)
MCV: 83.4 fL (ref 80.0–100.0)
MPV: 10.1 fL (ref 9.4–12.3)
Monocytes Absolute Automated: 0.71 10*3/uL (ref 0.00–1.20)
Monocytes: 11 %
Neutrophils Absolute: 2.4 10*3/uL (ref 1.80–8.10)
Neutrophils: 38 %
Nucleated RBC: 0 /100 WBC (ref 0–1)
Platelets: 246 10*3/uL (ref 140–400)
RBC: 3.32 10*6/uL — ABNORMAL LOW (ref 4.20–5.40)
RDW: 18 % — ABNORMAL HIGH (ref 12–15)
WBC: 6.32 10*3/uL (ref 3.50–10.80)

## 2014-03-02 LAB — APTT
PTT: 47 s — ABNORMAL HIGH (ref 23–37)
PTT: 109 s — ABNORMAL HIGH (ref 23–37)
PTT: 42 s — ABNORMAL HIGH (ref 23–37)

## 2014-03-02 LAB — BASIC METABOLIC PANEL
BUN: 24 mg/dL — ABNORMAL HIGH (ref 7.0–19.0)
CO2: 22 mEq/L (ref 22–29)
Calcium: 8.1 mg/dL — ABNORMAL LOW (ref 8.5–10.5)
Chloride: 107 mEq/L (ref 100–111)
Creatinine: 1.5 mg/dL — ABNORMAL HIGH (ref 0.6–1.0)
Glucose: 160 mg/dL — ABNORMAL HIGH (ref 70–100)
Potassium: 4.6 mEq/L (ref 3.5–5.1)
Sodium: 136 mEq/L (ref 136–145)

## 2014-03-02 LAB — HEMOLYSIS INDEX: Hemolysis Index: 2 (ref 0–18)

## 2014-03-02 LAB — FERRITIN: Ferritin: 190.97 ng/mL (ref 4.60–204.00)

## 2014-03-02 LAB — GLUCOSE WHOLE BLOOD - POCT
Whole Blood Glucose POCT: 124 mg/dL — ABNORMAL HIGH (ref 70–100)
Whole Blood Glucose POCT: 107 mg/dL — ABNORMAL HIGH (ref 70–100)
Whole Blood Glucose POCT: 140 mg/dL — ABNORMAL HIGH (ref 70–100)

## 2014-03-02 LAB — IRON PROFILE
Iron Saturation: 9 % — ABNORMAL LOW (ref 15–50)
Iron: 22 ug/dL — ABNORMAL LOW (ref 40–145)
TIBC: 244 ug/dL — ABNORMAL LOW (ref 265–497)
UIBC: 222 ug/dL (ref 126–382)

## 2014-03-02 LAB — PHOSPHORUS: Phosphorus: 3.2 mg/dL (ref 2.3–4.7)

## 2014-03-02 LAB — CREATININE, URINE, RANDOM: Urine Creatinine, Random: 82.3

## 2014-03-02 LAB — PROTEIN, URINE, RANDOM: Protein Urine Random: 171 — ABNORMAL HIGH (ref 1–14)

## 2014-03-02 LAB — MAGNESIUM: Magnesium: 2.1 mg/dL (ref 1.6–2.6)

## 2014-03-02 LAB — GFR: EGFR: 42.2

## 2014-03-02 MED ORDER — IRON SUCROSE 20 MG/ML IV SOLN
200.00 mg | INTRAVENOUS | Status: DC
Start: 2014-03-02 — End: 2014-03-04
  Administered 2014-03-02 – 2014-03-03 (×2): 200 mg via INTRAVENOUS
  Filled 2014-03-02 (×3): qty 10

## 2014-03-02 MED ORDER — DIPHENHYDRAMINE HCL 25 MG PO CAPS
25.00 mg | ORAL_CAPSULE | Freq: Once | ORAL | Status: AC
Start: 2014-03-02 — End: 2014-03-02
  Administered 2014-03-02: 25 mg via ORAL
  Filled 2014-03-02: qty 1

## 2014-03-02 MED ORDER — CARVEDILOL 6.25 MG PO TABS
12.50 mg | ORAL_TABLET | Freq: Once | ORAL | Status: AC
Start: 2014-03-02 — End: 2014-03-02
  Administered 2014-03-02: 12.5 mg via ORAL
  Filled 2014-03-02: qty 2

## 2014-03-02 MED ORDER — HYDRALAZINE HCL 25 MG PO TABS
25.00 mg | ORAL_TABLET | Freq: Once | ORAL | Status: AC
Start: 2014-03-02 — End: 2014-03-02
  Administered 2014-03-02: 25 mg via ORAL
  Filled 2014-03-02: qty 1

## 2014-03-02 MED ORDER — CARVEDILOL 25 MG PO TABS
25.00 mg | ORAL_TABLET | Freq: Two times a day (BID) | ORAL | Status: DC
Start: 2014-03-02 — End: 2014-03-04
  Administered 2014-03-02 – 2014-03-04 (×4): 25 mg via ORAL
  Filled 2014-03-02 (×4): qty 1

## 2014-03-02 NOTE — Progress Notes (Signed)
Chaplain Service      Background:  Visit Type: Initial was made by Chaplain with patient, [name], based on Source: Chaplain Initiated.  Present at Visit: patient.  Spiritual Care Provided to: patient only.  Length of Visit: 0-15 minutes .    Summary:  Spiritual Care Interventions: Provided emotional support, Explored difficult feelings, Explored belief and/or faith issues, Provided reflective and compassionate listening, Provided prayer, Provided spiritual encouragement  Reason for Request: Spiritual Assessment   Spiritual Care Outcomes: Patient expressed appreciation of visit

## 2014-03-02 NOTE — Progress Notes (Signed)
ICU/CCU Daily Progress Note    Patient's Name: [name]    Room:  RU045/WU981-19  Attending Provider: Roxana Hires, MD  Admit Date:02/28/2014  Medical Record Number: [id number]     Date/Time: 03/02/2014 6:57 AM    63 year old female with PMH of CAD, left circumflex stent in jan 2015 and RCA stent, Diabetes, PAD with chronic right lower leg ulcer, and mild COPD, presented to [facility] with Syncope and NSTEMI, had Heart Cath which showed Triple Vessel Disease, transferred to [facility] for probable CABG in setting of active foot ulcer , had likely Type 4A NSTEMI due to PCI, troponins trending down.  Pulm and ID on board     24hr events:  Patient was hypertensive to the 180s overnight, given 1 dose of 25mg  Hydralazine, slept comfortably with percoset    Patient Active Problem List   Diagnosis   . NSTEMI (non-ST elevated myocardial infarction)   . Hypertensive urgency   . PAD (peripheral artery disease)   . Arterial leg ulcer   . Chronic obstructive pulmonary disease, unspecified COPD type   . Ischemic cardiomyopathy          VITAL SIGNS PHYSICAL EXAM   Temp:  [97.4 F (36.3 C)-98.1 F (36.7 C)] 98 F (36.7 C)  Heart Rate:  [69-86] 74  Resp Rate:  [7-29] 14  BP: (135-175)/(63-76) 154/67 mmHg  Blood Glucose:  Pulse ox:  Telemetry:       Intake/Output Summary (Last 24 hours) at 03/02/14 2130  Last data filed at 03/02/14 0500   Gross per 24 hour   Intake 2085.16 ml   Output    450 ml   Net 1635.16 ml    Physical Exam  Cons : aa03 NAD  Neuro: No focal deficits  HEENT: MMM, no sores, unchanged   Cardiac: systolic murmur at right upper sternal border unchanged , normal s1-s2, no rubs or gallops  Lungs: wheezes at bases , CTA bilaterally, improved since yesterday  Abdomen: non tender, non distended  QMV:HQION bandaged, chronic ischemic changes present in feet bilaterally   Skin: venous stasis changes bilaterally legs        Scheduled Meds: PRN Meds:        aspirin 81 mg Oral Once   atorvastatin 80 mg Oral Daily   carvedilol  12.5 mg Oral Q12H SCH   darbepoetin alfa (ARANESP) injection 100 mcg Subcutaneous Weekly   fluticasone-salmeterol 2 puff Inhalation BID   gabapentin 600 mg Oral Q8H SCH   hydrALAZINE 50 mg Oral Q8H SCH   polyethylene glycol 17 g Oral Daily   Senna 8.6 mg Oral BID   tiotropium 18 mcg Inhalation QAM   venlafaxine 75 mg Oral Q24H   vitamin C 500 mg Oral Daily       Continuous Infusions:  . heparin 62952 units in dextrose 5% 500 mL 1,000 Units/hr (03/02/14 0500)   . custom IV infusion builder 50 mL/hr at 03/02/14 0500      albuterol-ipratropium 3 mL Q6H PRN   dextrose 25 mL PRN   glucagon (rDNA) 1 mg PRN   heparin (porcine) 3,000 Units PRN   insulin aspart 1-5 Units PRN   LORazepam 0.5 mg Q8H PRN   oxyCODONE-acetaminophen 1 tablet Q4H PRN           Labs (last 72 hours):  Recent Labs      03/02/14   0120  03/01/14   0551  02/28/14   1728   WBC  6.32  6.94  6.87   HEMOGLOBIN  8.1*  9.1*  9.0*   HEMATOCRIT  27.7*  29.9*  30.4*     Recent Labs      03/02/14   0120  03/01/14   1835  03/01/14   0551   PTT  47*  53*  68*    Recent Labs      03/02/14   0120  03/01/14   0551  02/28/14   1728   SODIUM  136  137  138   POTASSIUM  4.6  5.2*  4.8   CHLORIDE  107  108  108   CO2  22  21*  23   BUN  24.0*  22.0*  24.0*   CREATININE  1.5*  1.5*  1.3*   GLUCOSE  160*  140*  138*   CALCIUM  8.1*  9.2  8.8   MAGNESIUM  2.1  2.6  1.9   PHOSPHORUS  3.2  3.2  2.7                 Microbiology:   Microbiology Results     Procedure Component Value Units Date/Time    CULTURE BLOOD AEROBIC AND ANAEROBIC [[id number]] Collected:  02/28/14 2344    Specimen Information:  Blood, Venipuncture Updated:  03/02/14 0221    Narrative:      ORDER#: [id number]                                    ORDERED BY: Jacolyn Reedy  SOURCE: Blood, Venipuncture peripheral               COLLECTED:  02/28/14 23:44  ANTIBIOTICS AT COLL.:                                RECEIVED :  03/01/14 01:49  Culture Blood Aerobic and Anaerobic        PRELIM      03/02/14 02:21  03/02/14    No Growth after 1 day/s of incubation.      CULTURE BLOOD AEROBIC AND ANAEROBIC [[id number]] Collected:  02/28/14 2345    Specimen Information:  Blood, Venipuncture Updated:  03/02/14 0221    Narrative:      ORDER#: [id number]                                    ORDERED BY: Jacolyn Reedy  SOURCE: Blood, Venipuncture peripheral               COLLECTED:  02/28/14 23:45  ANTIBIOTICS AT COLL.:                                RECEIVED :  03/01/14 00:22  Culture Blood Aerobic and Anaerobic        PRELIM      03/02/14 02:21  03/02/14   No Growth after 1 day/s of incubation.      MRSA culture [[id number]] Collected:  02/28/14 1657    Specimen Information:  Body Fluid / Nasal/Throat ASC Admission Updated:  03/01/14 2037    Narrative:      ORDER#: [id number]  ORDERED BY: [facility], PAUL  SOURCE: Nares and Throat                             COLLECTED:  02/28/14 16:57  ANTIBIOTICS AT COLL.:                                RECEIVED :  02/28/14 19:44  Culture MRSA Surveillance                  FINAL       03/01/14 20:37  03/01/14   Negative for Methicillin Resistant Staph aureus from Nares and             Negative for Methicillin Resistant Staph aureus from Throat            Imaging:  Xr Tibia Fibula Right Ap And Lateral    03/01/2014    1. No findings of osteomyelitis involving the shafts of the tibia or fibula. 2. 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 Further evaluation of these regions should be guided by clinical parameters.  Annabell Sabal, MD  03/01/2014 11:12 AM     US Carotid Duplex Doppler Complete    03/01/2014    Diffuse intimal thickening, soft plaque and calcific plaque in the extracranial carotid arteries with less than 50% luminal narrowing.  Geanie Cooley, MD  03/01/2014 4:04 PM     US Venous Duplex Doppler Leg Bilateral Limited    03/01/2014      1. Right and left great saphenous  veins widely patent throughout as detailed above. 2. Small saphenous veins are widely patent bilaterally as detailed above.  3. Note that there is mild wall thickening in both small saphenous veins.  Gwenyth Allegra, MD  03/01/2014 4:07 PM         Assessment and Plan:    #NSTEMI VS Type 4A MI:   -No EKG changes, no chest pain. Isolated troponin bump from 0.15 to 21 following angiography 12/14  - troponins trending down 21->16  -Coreg 12.5  -  continue aspirin,    -continue low intensity heparin dri   - continue atorvastatin 80,   - hold ACE inhibitor with recovering AKI hold  - hold clopidogrel with possible coronary artery bypass graft this week.   -Nitro drip discontinued due to headache   - US Venous Doppler negative, US Carotids show less than 50% narrowing     #AKI 2/2 Ace Inhibitor/Prerenal    - holding Ace Inhibitor/lasix     - creat currently 1.5, potassium 4.6    # Multivessel CAD: 1. Distal left main, 60 % stenosis, ostial. 2. Ostial circumflex, 80 % stenosis, ulcerated. 3. 3rd OM with ostial lesion. 4 100 % mid RCA with left to right collaterals; Chronically occluded right coronary artery stent (per Angio 06/2013).   --cardiothoracic surgery is planning for surgery Friday     #PAD w/ chronic -medial right lower extremity arterial ulcer-status post angioplasty in April 2015.   - Wound culture from 12/13 at [facility] indicates many mixed flora with no predominating organism.   -Wound Care Nurse Consulted   -ID consulted to make recs regarding CABG, feel no reason to delay surgery as no infection, suggest Vanc and Cefipime in perioperative phase     #hypertension:    -  BP between 140-150 systolic   - hydralazine 50mg /q8  -Coreg 12.5/q12..    #type 2 diabetes: Insulin-dependent. H A1c = 7.1 on 12/12.       - sliding scale per home regimen       - no need for basal insulin at this time     #COPD:    -PFTs done, meets criteria for acceptable risk for surgery, per pulmopnary there recommednation is prior to  surgery to discontinue Spiriva and Symbicort one day before surgery, start Budenoside inhaler and round the clock Albuterol Nebs up until surgery for optimal pulmonary function    - CXR 12/14 showed no acute findings   . Resumed Spiriva and Symbicort equivalent while in hospital    Q 6 Duonebs at present.       #asymptomatic bacteriuria.   Greater than 100,000 colony-forming units GNR at [facility].patient    - no symptoms  - no need to treat currently    # Anemia:        - Iron Profile shows low iron and low iron sat      - started venofer and aranesp      - due to chronic disease and iron deficiency     #anxiety    -restarted on Venlafaxine per her home dose    #FEN: Cardiac diet, gentle IV fluid hydration       Prophylaxis:  - DVT:  Heparin   - GI:  None     Code Status: FULL    Dispo: CABG     Lines/Drains/Airways:      Safety Checklist:     DVT prophylaxis:  CHEST guideline (See page [id number]) Chemical and Mechanical   Foley: Not present   IVs:  Peripheral IV   PT/OT: Ordered   Daily CBC & or Chem ordered:  SHM/ABIM guidelines (see #5) Yes, due to clinical and lab instability   Reference for charges of common labs: CBC auto diff - $76  BMP - $99  Mg - $79        Signed by: Claud Kelp, DO  Date/Time: 03/02/2014 6:21 AM

## 2014-03-02 NOTE — Progress Notes (Signed)
Writer received voicemail from Dr. Hyacinth Meeker (CCU Resident) requesting assistance with establishing Advanced Directive. Writer met with and provided Right to Decide booklet.     Debara Pickett, RN, BSN  Clinical Case Manager I  [facility]  [phone]

## 2014-03-02 NOTE — Plan of Care (Incomplete)
Cardiac: sinus rhythm - no ectopy- B/P remains elevated and coreg dose increased. No c/o chest pain    Resp: Pt on 2L NC  With O2 sats >95 and weaned to room air and sats remain > 93    GI: Tolerating diet without problems- blood sugars as documented    GU: Voiding via BSC without problems    Right foot ulcer dressing changed as ordered. Pt awaiting art dopplers for surgical workup    No safety issues this shift

## 2014-03-02 NOTE — Progress Notes (Signed)
Cm spoke w/ pt via bedside and introduced self/role,  Cm discussed d/c planning. Pt A&O x3, prior to hospitalization pt was Independent w/ daily care/needs, requiring walker as needed for ambulating.     Pt lives in a [place], with daughters, pt stated she has support if needed.     Currently pt/ is recommending h w/hh/pt, Cm spoke with [facility] ext [phone], and informed of pt needs upon d/c.    Cm will continue to follow up w/ d/c needs of pt.        Case Management Initial Discharge Planning Assessment         Patient is a Medicare focused diagnosis patient? Yes/No no   Patient is a 30 day INPATIENT readmission (current inpatient admission and another inpatient admission within 30 days)? Yes/No no      EPIC Documentation  CM Assessment complete button selected in CM Navigator? Yes/No yes   Readmissions flowsheet completed in CM Navigator if patient is readmission? Yes/No no       Psychosocial/Demographic Information   Name of interviewee/s:  pt   Orientation and decision making abilities of patient (ie a&ox3 able to make decisions, demented patient, patient on vent, etc)   x3   Does the patient have an Advance Directive? Location? (home/on chart, if home-advised to bring in copy?) <no information>  Advance Directive: Patient does not have advance directive]   Healthcare Decision Maker (HDM) (if other than the patient) Include relationship and contact information.      Any additional emergency contacts? Extended Emergency Contact Information  Primary Emergency Contact: Rosalee Kaufman [place]  Mobile Phone: [phone]  Relation: Daughter   Pt lives with:  Living Arrangements: Children]   Type of residence where patient lives:   Townhome, o steps   Prior level of functioning (ambulation & ADL's)  Independent walker to assist w/ ambulation as needed.   Support system-list  (i.e.church, friends, extended family, friends?) family   Do you want to designate an individual who will care for or assist you  upon discharge? daughter   If yes: Please list the name, relationship, phone number, and address of the designated individual. Name: [name]  Relationship: daughter  Phone Number: same as above  Address: same as above.       Correct Insurance listed on face sheet - verified with the patient/HDM Primary: FEP BCBS,  No Secondary      Discharge Planning Services in Place  Name of Primary Care Physician verified in patient banner (update in patient banner if not listed) Karl Ito, MD  [phone]   What DME does the patient currently own? (rolling walker, hospital bed, home O2, BiPAP/CPAP, bedside commode, cane, hoyer lift) walker   Are PT/OT services indicated? If so, has it been ordered?   pt ordered, no ot   Has the patient been to an Acute Rehab or SNF in the past?  If so, where?   no   Does the patient currently have home health or hospice/palliative services in place?  If so, list agency name.   no   Does the patient already have community dialysis set up?  If so, where? no      Readmission Assessment  What is the current LACE score?  7   Is this patient an inpatient to inpatient 30 day readmission? no   Previous admission discharge diagnosis no   Was patient readmitted from a facility? no   Patient active with Home Health? no  Patient active with Home Hospice? no   Contributing factors to readmission (i.e., no follow up appt on previous d/c, unable to get meds, no insurance, no social support, etc.)    Did patient/family understand what medication was for and how to administer, symptoms to indicate worsening condition, activity and diet restrictions at time of previous d/c? no      Anticipated Discharge Plan  Anticipated Disposition: Option A  h w/hh pt   Anticipated Disposition: Option B     Who will transport the patient upon discharge? daughter   If applicable, were SNF or Hospice choices provided? n/a   Palliative Care Consult needed? (if yes, contact attending MD) no      Medicare/Medicare HMO  Patients Only  If this patient is inpatient, was an initial IMM signed within 24 hours of admission? (Look in media tab, documents table or shadow chart)       Is this patient identified as a Medicare focused diagnosis or readmission? Yes/No         Sheriff Rodenberg [facility] Discharge Planner  [facility]  Phone [phone]

## 2014-03-02 NOTE — Progress Notes (Addendum)
PROGRESS NOTE    Date Time: 03/02/2014 10:01 AM  Patient Name: [name], [name]      Assessment:   COPD  Wheezing reported per history, no active wheezing  Spirometry/PFT Abnormality (Moderate obstruction on PFT's with moderate reduction in diffusing capacity.)  Multivessel CAD  PVD    Plan:   -Agree with Spiriva and Advair for now  -No need for PO Steroids  -1 day prior to surgery and in the post-op period, would stop Spiriva and place on around the clock DuoNebs/Budesonide for further optimization  -No need to hold surgery from a Pulmonary/COPD perspective  -Wean FiO2 as toerated    Subjective:   Denies SOB.  No new complaints.  No coughing reported. On 2L NC      Full PFT's done:  FEV1=1.10 (61%), FVC=1.70 (74%), FEV1/FVC=65, DLCO=10.4 (46%), TLC=3.68 (80%), consistent with moderate obstruction with moderate reduction in diffusing capacity.    Medications:     Current Facility-Administered Medications   Medication Dose Route Frequency   . aspirin  81 mg Oral Once   . atorvastatin  80 mg Oral Daily   . carvedilol  12.5 mg Oral Once   . carvedilol  25 mg Oral Q12H SCH   . darbepoetin alfa (ARANESP) injection  100 mcg Subcutaneous Weekly   . fluticasone-salmeterol  2 puff Inhalation BID   . gabapentin  600 mg Oral Q8H SCH   . hydrALAZINE  50 mg Oral Q8H SCH   . iron sucrose  200 mg Intravenous Q24H SCH   . polyethylene glycol  17 g Oral Daily   . Senna  8.6 mg Oral BID   . tiotropium  18 mcg Inhalation QAM   . venlafaxine  75 mg Oral Q24H   . vitamin C  500 mg Oral Daily       Review of Systems:   A comprehensive review of systems was: Negative except per HPI    Physical Exam:     Filed Vitals:    03/02/14 0700   BP: 171/70   Pulse: 72   Temp:    Resp: 15   SpO2: 100%   Tm=98.0    Intake and Output Summary (Last 24 hours) at Date Time    Intake/Output Summary (Last 24 hours) at 03/02/14 1001  Last data filed at 03/02/14 0700   Gross per 24 hour   Intake 1911.16 ml   Output    925 ml   Net 986.16 ml       General  appearance - alert, well appearing, and in no distress  Mental status - alert, oriented to person, place, and time  Eyes - pupils equal and reactive, extraocular eye movements intact  Neck - supple, no significant adenopathy  Chest - NO WHEEZING, trace b/[name] crackles  Heart - normal rate and regular rhythm  Abdomen - soft, nontender, nondistended, no masses or organomegaly  Neurological - alert, oriented, normal speech, no focal findings or movement disorder noted  Musculoskeletal - no joint tenderness, deformity or swelling  Extremities - peripheral pulses normal, no pedal edema, no clubbing or cyanosis, RLE in pressure dressing for wound  Skin - normal coloration and turgor, no rashes, no suspicious skin lesions noted    Labs:     Results     Procedure Component Value Units Date/Time    Ferritin [[id number]] Collected:  03/02/14 0120    Specimen Information:  Blood Updated:  03/02/14 0517     Ferritin 190.97 ng/mL  Narrative:      Every 6 hours after heparin initiation/rate change then every  12 hours once stable per low intensity protocol    IRON PROFILE [[id number]]  (Abnormal) Collected:  03/02/14 0120     Iron 22 ([name]) ug/dL Updated:  66/44/03 4742     UIBC 222 ug/dL      TIBC 595 ([name]) ug/dL      Iron Saturation 9 ([name]) %     Narrative:      Every 6 hours after heparin initiation/rate change then every  12 hours once stable per low intensity protocol    Hemolysis index [[id number]] Collected:  03/02/14 0120     Hemolysis Index 2 Updated:  03/02/14 0457    Narrative:      Every 6 hours after heparin initiation/rate change then every  12 hours once stable per low intensity protocol    Glucose Whole Blood - POCT [[id number]]  (Abnormal) Collected:  03/02/14 0434     POCT - Glucose Whole blood 124 (H) mg/dL Updated:  60/63/01 6010    CULTURE BLOOD AEROBIC AND ANAEROBIC [[id number]] Collected:  02/28/14 2344    Specimen Information:  Blood, Venipuncture Updated:  03/02/14 0221    Narrative:      ORDER#: [id number]                                     ORDERED BY: Jacolyn Reedy  SOURCE: Blood, Venipuncture peripheral               COLLECTED:  02/28/14 23:44  ANTIBIOTICS AT COLL.:                                RECEIVED :  03/01/14 01:49  Culture Blood Aerobic and Anaerobic        PRELIM      03/02/14 02:21  03/02/14   No Growth after 1 day/s of incubation.      CULTURE BLOOD AEROBIC AND ANAEROBIC [[id number]] Collected:  02/28/14 2345    Specimen Information:  Blood, Venipuncture Updated:  03/02/14 0221    Narrative:      ORDER#: [id number]                                    ORDERED BY: Jacolyn Reedy  SOURCE: Blood, Venipuncture peripheral               COLLECTED:  02/28/14 23:45  ANTIBIOTICS AT COLL.:                                RECEIVED :  03/01/14 00:22  Culture Blood Aerobic and Anaerobic        PRELIM      03/02/14 02:21  03/02/14   No Growth after 1 day/s of incubation.      Magnesium [[id number]] Collected:  03/02/14 0120    Specimen Information:  Blood Updated:  03/02/14 0214     Magnesium 2.1 mg/dL     Narrative:      Every 6 hours after heparin initiation/rate change then every  12 hours once stable per low intensity protocol    Phosphorus [[id number]] Collected:  03/02/14 0120    Specimen Information:  Blood Updated:  03/02/14  0214     Phosphorus 3.2 mg/dL     Narrative:      Every 6 hours after heparin initiation/rate change then every  12 hours once stable per low intensity protocol    Basic Metabolic Panel [[id number]]  (Abnormal) Collected:  03/02/14 0120    Specimen Information:  Blood Updated:  03/02/14 0214     Glucose 160 (H) mg/dL      BUN 28.3 (H) mg/dL      Creatinine 1.5 (H) mg/dL      CALCIUM 8.1 ([name]) mg/dL      Sodium 151 mEq/[name]      Potassium 4.6 mEq/[name]      Chloride 107 mEq/[name]      CO2 22 mEq/[name]     Narrative:      Every 6 hours after heparin initiation/rate change then every  12 hours once stable per low intensity protocol    GFR [[id number]] Collected:  03/02/14 0120     EGFR 42.2 Updated:  03/02/14 0214    Narrative:       Every 6 hours after heparin initiation/rate change then every  12 hours once stable per low intensity protocol    APTT (Q6-12H PRN) [[id number]]  (Abnormal) Collected:  03/02/14 0120     PTT 47 (H) sec Updated:  03/02/14 0205     APTT Anticoag. Given w/i 48 hrs. heparin     Narrative:      Every 6 hours after heparin initiation/rate change then every  12 hours once stable per low intensity protocol    CBC and differential [[id number]]  (Abnormal) Collected:  03/02/14 0120    Specimen Information:  Blood / Blood Updated:  03/02/14 0147     WBC 6.32 x10 3/uL      RBC 3.32 ([name]) x10 6/uL      Hgb 8.1 ([name]) g/dL      Hematocrit 38.1 ([name]) %      MCV 83.4 fL      MCH 24.4 ([name]) pg      MCHC 29.2 ([name]) g/dL      RDW 18 (H) %      Platelets 246 x10 3/uL      MPV 10.1 fL      Neutrophils 38 %      Lymphocytes Automated 35 %      Monocytes 11 %      Eosinophils Automated 15 %      Basophils Automated 0 %      Immature Granulocyte 0 %      Nucleated RBC 0 /100 WBC      Neutrophils Absolute 2.40 x10 3/uL      Abs Lymph Automated 2.23 x10 3/uL      Abs Mono Automated 0.71 x10 3/uL      Abs Eos Automated 0.95 (H) x10 3/uL      Absolute Baso Automated 0.02 x10 3/uL      Absolute Immature Granulocyte 0.01 x10 3/uL     Narrative:      Every 6 hours after heparin initiation/rate change then every  12 hours once stable per low intensity protocol    Protein, Random, Urine [[id number]]  (Abnormal) Collected:  03/01/14 2158    Specimen Information:  Urine Updated:  03/02/14 0058     Protein Urine Random 171 (H)     Narrative:      Every 6 hours after heparin initiation/rate change then every  12 hours once stable per low intensity protocol  Creatinine, urine, random [[id number]] Collected:  03/01/14 2158    Specimen Information:  Urine Updated:  03/02/14 0058     Creatinine, UR 82.3     Narrative:      Every 6 hours after heparin initiation/rate change then every  12 hours once stable per low intensity protocol    Urinalysis with microscopic  [[id number]]  (Abnormal) Collected:  03/01/14 2158    Specimen Information:  Urine Updated:  03/01/14 2223     Urine Type Clean Catch      Color, UA Yellow      Clarity, UA Clear      Specific Gravity UA 1.013      Urine pH 6.5      Leukocyte Esterase, UA Large (A)      Nitrite, UA Negative      Protein, UR 100 (A)      Glucose, UA Negative      Ketones UA Negative      Urobilinogen, UA Normal mg/dL      Bilirubin, UA Negative      Blood, UA Negative      RBC, UA 6 - 10 (A) /hpf      WBC, UA 26 - 50 (A) /hpf      Squamous Epithelial Cells, Urine 0 - 5 /hpf      Hyaline Casts, UA 0 - 2 /lpf     Narrative:      Every 6 hours after heparin initiation/rate change then every  12 hours once stable per low intensity protocol    MRSA culture [[id number]] Collected:  02/28/14 1657    Specimen Information:  Body Fluid / Nasal/Throat ASC Admission Updated:  03/01/14 2037    Narrative:      ORDER#: [id number]                                    ORDERED BY: Junie Bame  SOURCE: Nares and Throat                             COLLECTED:  02/28/14 16:57  ANTIBIOTICS AT COLL.:                                RECEIVED :  02/28/14 19:44  Culture MRSA Surveillance                  FINAL       03/01/14 20:37  03/01/14   Negative for Methicillin Resistant Staph aureus from Nares and             Negative for Methicillin Resistant Staph aureus from Throat      APTT (Q6-12H PRN) [[id number]]  (Abnormal) Collected:  03/01/14 1835     PTT 53 (H) sec Updated:  03/01/14 1901     APTT Anticoag. Given w/i 48 hrs. heparin     Narrative:      Every 6 hours after heparin initiation/rate change then every  12 hours once stable per low intensity protocol    Glucose Whole Blood - POCT [[id number]]  (Abnormal) Collected:  03/01/14 1704     POCT - Glucose Whole blood 125 (H) mg/dL Updated:  31/51/76 1607    Troponin I [[id number]]  (Abnormal) Collected:  03/01/14 1124    Specimen Information:  Blood Updated:  03/01/14  1213     Troponin I 8.55 (H) ng/mL      Glucose Whole Blood - POCT [[id number]]  (Abnormal) Collected:  03/01/14 1057     POCT - Glucose Whole blood 153 (H) mg/dL Updated:  40/98/11 9147          Rads:   Radiological Procedure reviewed.    Xr Tibia Fibula Right Ap And Lateral    03/01/2014    1. No findings of osteomyelitis involving the shafts of the tibia or fibula. 2. 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 Further evaluation of these regions should be guided by clinical parameters.  Annabell Sabal, MD  03/01/2014 11:12 AM     Xr Chest Ap Portable    02/28/2014    No acute disease.  Kennyth Lose, MD  02/28/2014 9:38 PM     US Carotid Duplex Doppler Complete    03/01/2014    Diffuse intimal thickening, soft plaque and calcific plaque in the extracranial carotid arteries with less than 50% luminal narrowing.  Geanie Cooley, MD  03/01/2014 4:04 PM     US Venous Duplex Doppler Leg Bilateral Limited    03/01/2014      1. Right and left great saphenous veins widely patent throughout as detailed above. 2. Small saphenous veins are widely patent bilaterally as detailed above.  3. Note that there is mild wall thickening in both small saphenous veins.  Gwenyth Allegra, MD  03/01/2014 4:07 PM       Signed by: Oley Balm

## 2014-03-02 NOTE — Progress Notes (Signed)
ID PROGRESS NOTE    Date Time: 03/02/2014 10:49 AM  Patient Name: [name], [name]      Problem List:    Acute Problem List:   Right lower extremity wound; chronic; currently on no antibiotics; cx at [facility]; mixed organisms  S/P angioplasty 06/2013  Asymptomatic bacteruria; cx GNR at [facility]  Allergy PCN: hives, tolerates cephalosporins     Chronic Conditions:  Diabetes mellitus type 2  Severe peripheral artery disease  CAD; S/P stent  Hypertension   CKD  COPD  Anemia    Assessment:   Chronic RLE wound in setting of PVD and DM; clinically no active infection but wound does have some exudate.  This is a chronic wound which she has received wound care for as an outpatient in the past.  No need for systemic antibiotics.  X-ray on 03/01/14 - No evidence of osteomyelitis.    Per Dr. Benjie Karvonen discussion with her daughter, the patient has tolerated cephalosporins in the past.    No dysuria    Blood cx NGTD  MRSA screen negative    Antibiotics:   None    Plan:   No contraindications from an ID point of view to surgery.    In terms of perioperative antibiotics would use vancomycin 1 gm x1 before surgery and cefepime 2 gm x1 before surgery, routine post-op cefuroxime x3 doses.    No urinary symptoms, so no need to recheck the urine.    No need for ongoing antibiotic therapy at this time.    Lines:     Patient Lines/Drains/Airways Status    Active PICC Line / CVC Line / PIV Line / Drain / Airway / Intraosseous Line / Epidural Line / ART Line / Line / Wound / Pressure Ulcer / NG/OG Tube     Name:   Placement date:   Placement time:   Site:   Days:    Peripheral IV 02/28/14 Left Hand  02/28/14      Hand   2    Peripheral IV 03/01/14 Right Antecubital  03/01/14   1130   Antecubital   less than 1    Wound (Miscellaneous) 02/28/14 Other (Comment) Foot Right;Lateral  02/28/14   1600   Foot   1                *I have performed a risk-benefit analysis and the patient needs a central line for access and IV medications    Family History:      Family History   Problem Relation Age of Onset   . Coronary artery disease Mother      Died at 49 from MI.   Marland Kitchen Coronary artery disease Father      Died at 35 from MI   . Diabetes type II       Mother and father       Social History:     History     Social History   . Marital Status: Widowed     Spouse Name: N/A     Number of Children: N/A   . Years of Education: N/A     Social History Main Topics   . Smoking status: Former Smoker -- 1.00 packs/day for 20 years     Quit date: 02/29/2012   . Smokeless tobacco: Not on file   . Alcohol Use: Not on file   . Drug Use: Not on file   . Sexual Activity: Not on file     Other Topics Concern   .  Not on file     Social History Narrative   . No narrative on file       Allergies:     Allergies   Allergen Reactions   . Penicillins Anaphylaxis and Angioedema       Review of Systems:   Afebrile  PFTs this morning  No dysuria    Physical Exam:     Filed Vitals:    03/02/14 0700   BP: 171/70   Pulse: 72   Temp:    Resp: 15   SpO2: 100%     General Appearance:  non-toxic  Neuro: awake, alert  Lungs: clear to auscultation, no wheezes, rales or rhonchi   Cardiac: normal rate, regular rhythm   Abdomen: soft, non-tender   Extremities: left foot with a large ulcer, no signs of active cellulitis    Labs:     Lab Results   Component Value Date    WBC 6.32 03/02/2014    HGB 8.1* 03/02/2014    HCT 27.7* 03/02/2014    MCV 83.4 03/02/2014    PLT 246 03/02/2014     Lab Results   Component Value Date    CREAT 1.5* 03/02/2014     Lab Results   Component Value Date    ALT 13 02/28/2014    AST 81* 02/28/2014    ALKPHOS 64 02/28/2014    BILITOTAL 0.1* 02/28/2014     No results found for: LACTATE    Microbiology:     Microbiology Results     Procedure Component Value Units Date/Time    CULTURE BLOOD AEROBIC AND ANAEROBIC [[id number]] Collected:  02/28/14 2344    Specimen Information:  Blood, Venipuncture Updated:  03/02/14 0221    Narrative:      ORDER#: [id number]                                     ORDERED BY: Jacolyn Reedy  SOURCE: Blood, Venipuncture peripheral               COLLECTED:  02/28/14 23:44  ANTIBIOTICS AT COLL.:                                RECEIVED :  03/01/14 01:49  Culture Blood Aerobic and Anaerobic        PRELIM      03/02/14 02:21  03/02/14   No Growth after 1 day/s of incubation.      CULTURE BLOOD AEROBIC AND ANAEROBIC [[id number]] Collected:  02/28/14 2345    Specimen Information:  Blood, Venipuncture Updated:  03/02/14 0221    Narrative:      ORDER#: [id number]                                    ORDERED BY: Jacolyn Reedy  SOURCE: Blood, Venipuncture peripheral               COLLECTED:  02/28/14 23:45  ANTIBIOTICS AT COLL.:                                RECEIVED :  03/01/14 00:22  Culture Blood Aerobic and Anaerobic        PRELIM      03/02/14 02:21  03/02/14   No Growth after 1 day/s of incubation.      MRSA culture [[id number]] Collected:  02/28/14 1657    Specimen Information:  Body Fluid / Nasal/Throat ASC Admission Updated:  03/01/14 2037    Narrative:      ORDER#: [id number]                                    ORDERED BY: Junie Bame  SOURCE: Nares and Throat                             COLLECTED:  02/28/14 16:57  ANTIBIOTICS AT COLL.:                                RECEIVED :  02/28/14 19:44  Culture MRSA Surveillance                  FINAL       03/01/14 20:37  03/01/14   Negative for Methicillin Resistant Staph aureus from Nares and             Negative for Methicillin Resistant Staph aureus from Throat            Rads:   Xr Tibia Fibula Right Ap And Lateral    03/01/2014    1. No findings of osteomyelitis involving the shafts of the tibia or fibula. 2. 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 Further evaluation of these regions should be guided by clinical parameters.  Annabell Sabal, MD  03/01/2014 11:12 AM     US Carotid Duplex Doppler Complete    03/01/2014    Diffuse intimal  thickening, soft plaque and calcific plaque in the extracranial carotid arteries with less than 50% luminal narrowing.  Geanie Cooley, MD  03/01/2014 4:04 PM     US Venous Duplex Doppler Leg Bilateral Limited    03/01/2014      1. Right and left great saphenous veins widely patent throughout as detailed above. 2. Small saphenous veins are widely patent bilaterally as detailed above.  3. Note that there is mild wall thickening in both small saphenous veins.  Gwenyth Allegra, MD  03/01/2014 4:07 PM       Signed by: Marga Hoots, MD

## 2014-03-02 NOTE — Progress Notes (Signed)
CCU ATTENDING DAILY PROGRESS NOTE     Date Time:   03/02/2014    11:48 AM  Patient Name: [name]    LOS: 2 days     Subjective/24 Hour Events:  No events overnight  Denies chest pain or bleeding with heparin  FEV1 improved on re-test, > 1Liter    Scheduled Meds:   aspirin 81 mg Oral Once   atorvastatin 80 mg Oral Daily   carvedilol 25 mg Oral Q12H SCH   darbepoetin alfa (ARANESP) injection 100 mcg Subcutaneous Weekly   fluticasone-salmeterol 2 puff Inhalation BID   gabapentin 600 mg Oral Q8H SCH   hydrALAZINE 50 mg Oral Q8H SCH   iron sucrose 200 mg Intravenous Q24H SCH   polyethylene glycol 17 g Oral Daily   Senna 8.6 mg Oral BID   tiotropium 18 mcg Inhalation QAM   venlafaxine 75 mg Oral Q24H   vitamin C 500 mg Oral Daily     Continuous Infusions:  . heparin 56213 units in dextrose 5% 500 mL 1,000 Units/hr (03/02/14 1100)   . custom IV infusion builder 50 mL/hr at 03/02/14 1100     PRN Meds:albuterol-ipratropium, dextrose, glucagon (rDNA), heparin (porcine), insulin aspart, LORazepam, oxyCODONE-acetaminophen    Objective:  Physical Exam:  BP 136/63 mmHg  Pulse 70  Temp(Src) 98 F (36.7 C) (Oral)  Resp 20  Ht 1.562 m (5' 1.5")  Wt 71.6 kg (157 lb 13.6 oz)  BMI 29.35 kg/m2  SpO2 96%   Intake/Output Summary (Last 24 hours) at 03/02/14 1148  Last data filed at 03/02/14 1100   Gross per 24 hour   Intake 2451.16 ml   Output    925 ml   Net 1526.16 ml     General: AO3, elderly woman, pleasant  Cardiovascular:  RRR, soft 2/6 systolic murmur  Lungs:    Clear to auscultation bilaterally, without wheezing, rhonchi, or rales  Abdomen: soft, non-tender, non-distended, no hepatosplenomegaly, normoactive bowel sounds  Extremities:  Warm, RLE in bandage  Lab Review:   Recent Labs      03/02/14   0120  03/01/14   0551  02/28/14   1728   WBC  6.32  6.94  6.87   HEMOGLOBIN  8.1*  9.1*  9.0*   HEMATOCRIT  27.7*  29.9*  30.4*   PLATELETS  246  297  297     Recent Labs      03/01/14   1124  02/28/14   2344  02/28/14   1728    TROPONIN I  8.55*  16.96*  21.49*    Recent Labs      03/02/14   0120  03/01/14   0551  02/28/14   1728   SODIUM  136  137  138   POTASSIUM  4.6  5.2*  4.8   CO2  22  21*  23   BUN  24.0*  22.0*  24.0*   CREATININE  1.5*  1.5*  1.3*   GLUCOSE  160*  140*  138*   MAGNESIUM  2.1  2.6  1.9     No results for input(s): INR, TSH, CHOL, TRIG, HDL, LDL in the last 72 hours.       Imaging:  Radiology Results (24 Hour)     Procedure Component Value Units Date/Time    US Venous Duplex Doppler Leg Bilateral Limited [[id number]] Collected:  03/01/14 1602    Order Status:  Completed Updated:  03/01/14 1611    Narrative:  PROCEDURE:  Duplex ultrasound of bilateral lower extremity superficial  veins.    INDICATION:  63 year old female preop coronary bypass surgery. Now for  evaluation of lower extremity superficial veins.    TECHNIQUE:  High resolution gray scale imaging, color duplex, and  spectral waveform analysis of the great saphenous and small saphenous  veins was performed bilaterally.    FINDINGS:    Right lower extremity superficial veins: The great saphenous vein is  widely patent throughout, measuring between 2.5 and 4 mm in the calf and  in the thigh, between 3.9 and 4.4 mm. The small saphenous vein is widely  patent, measuring between 2.2 and 4.4 mm in the calf. Note that there is  some mild wall thickening in the small saphenous vein.    Left lower extremity superficial veins: The great saphenous vein is  widely patent throughout, measuring between 0.7 and 1.3 mm in the calf  and in the thigh, between 1.2 and 3.0 mm. The small saphenous vein is  widely patent in the mid to proximal calf, measuring between 1.1 and 1.8  mm. Note that there is some mild wall thickening in the small saphenous  vein.      Impression:          1. Right and left great saphenous veins widely patent throughout as  detailed above.  2. Small saphenous veins are widely patent bilaterally as detailed  above.   3. Note that there is mild wall  thickening in both small saphenous  veins.    Gwenyth Allegra, MD   03/01/2014 4:07 PM      US Carotid Duplex Doppler Complete [[id number]] Collected:  03/01/14 1559    Order Status:  Completed Updated:  03/01/14 1608    Narrative:      HISTORY: Diabetes, hypertension, CVA. Preop for CABG..    COMPARISON: None.    DUPLEX CAROTID DOPPLER:  The extracranial carotid arteries and proximal  vertebral arteries were evaluated with high resolution imaging, color  Doppler, and spectral Doppler techniques.  Findings are as follows:    RIGHT CAROTID: Imaging demonstrates diffuse intimal thickening and  calcific plaque in the distal common carotid artery extending into the  proximal internal carotid artery, degree of luminal narrowing is less  than 50%.    LEFT CAROTID: Imaging demonstrates  diffuse intimal thickening and  calcific plaque in the distal common carotid artery extending into the  proximal internal carotid artery degree of luminal narrowing is less  than 50%.  Marland Kitchen    PROXIMAL VERTEBRAL ARTERIES: Patent.  Antegrade flow.    MEASURED VELOCITIES (CM/S):  Right CCA:                  137  Right ICA (peak systolic):  109  Right ICA (end diastolic):  28  Right ECA:                  197    Left CCA:                   139  Left ICA (peak systolic):   91  Left ICA (end diastolic):   27  Left ECA:                   141      Impression:       Diffuse intimal thickening, soft plaque and calcific plaque  in the extracranial carotid arteries with less than 50% luminal  narrowing.  Geanie Cooley, MD   03/01/2014 4:04 PM            Assessment:    1. CAD multivessel CAD with severe LM, mildly depressed LVEF 45%  2. Moderate Aortic Stenosis, AVA 1.2 cm2  3. Chronic RLE wound, no abx, no evidence of osteo  4. CKD, CrCl improved  5. COPD  6. DM2  7. HTn  8. Anemia of Chronic Disease    Plan:    1. CV eval for CABG +/- AVR  2. Renal consult recommend HCO3 gtt  4. Keep heparin gtt  6. Cont aspirin and atorvastatin 80mg   7. COPD tx:  stop Spiriva prior To CABG and start Duonebs/budesonide      Signed by: Precious Gilding, MD  Spectralink [phone]

## 2014-03-02 NOTE — Progress Notes (Signed)
[facility], [employer].  Progress Note    Assessment:  1.  NSTEMI/3 vessel CAD (cardiac cath @ [facility]) -> transfer from [facility] for CABG consideration  2.  Acute on CRF; non oliguric; multifactorial etiology; stable  3.  Metabolic acidosis (mild); non anion gap  4.  Anemia; normocytic/mildly hypochromic  5.  Htn; suboptimal control    Plan:  1.  Continue low level IVFs: 1/2ns + NaHCO3 54meq/L @ 50cc/hr  2.  Titrate Coreg from 6.25 -> 12.5/12.5mg  -> 25/25mg   3.  Aranesp/Venofer ->  4.  CT surgery evaluation ongoing    Further recommendations to follow      Thank you    Louellen Molder, MD  Office - [phone]  Spect[employer] Link - [phone]  ++++++++++++++++++++++++++++++++++++++++++++++++++++++++++++++  Subjective:  No new complaints    Medications:  Scheduled Meds:  Current Facility-Administered Medications   Medication Dose Route Frequency   . aspirin  81 mg Oral Once   . atorvastatin  80 mg Oral Daily   . carvedilol  12.5 mg Oral Once   . carvedilol  25 mg Oral Q12H SCH   . darbepoetin alfa (ARANESP) injection  100 mcg Subcutaneous Weekly   . fluticasone-salmeterol  2 puff Inhalation BID   . gabapentin  600 mg Oral Q8H SCH   . hydrALAZINE  50 mg Oral Q8H SCH   . polyethylene glycol  17 g Oral Daily   . Senna  8.6 mg Oral BID   . tiotropium  18 mcg Inhalation QAM   . venlafaxine  75 mg Oral Q24H   . vitamin C  500 mg Oral Daily     Continuous Infusions:  . heparin 10272 units in dextrose 5% 500 mL 1,000 Units/hr (03/02/14 0700)   . custom IV infusion builder 50 mL/hr at 03/02/14 0700     PRN Meds:albuterol-ipratropium, dextrose, glucagon (rDNA), heparin (porcine), insulin aspart, LORazepam, oxyCODONE-acetaminophen    Objective:  Vital signs in last 24 hours:  Temp:  [97.4 F (36.3 C)-98.1 F (36.7 C)] 98 F (36.7 C)  Heart Rate:  [69-83] 72  Resp Rate:  [7-29] 15  BP: (135-175)/(63-76) 171/70 mmHg  Intake/Output last 24 hours:    Intake/Output Summary (Last 24 hours) at 03/02/14  0952  Last data filed at 03/02/14 0700   Gross per 24 hour   Intake 1929.16 ml   Output    925 ml   Net 1004.16 ml     Intake/Output this shift:       Physical Exam:   Gen: WD WN NAD   CV: S1 S2 N RRR   Chest: No rales/no rhonchi   Ab: ND NT soft no HSM +BS   Ext: No edema    Labs:    Recent Labs  Lab 03/02/14  0120 03/01/14  0551 02/28/14  1728   GLUCOSE 160* 140* 138*   BUN 24.0* 22.0* 24.0*   CREATININE 1.5* 1.5* 1.3*   CALCIUM 8.1* 9.2 8.8   SODIUM 136 137 138   POTASSIUM 4.6 5.2* 4.8   CHLORIDE 107 108 108   CO2 22 21* 23   ALBUMIN  --   --  2.7*   PHOSPHORUS 3.2 3.2 2.7   MAGNESIUM 2.1 2.6 1.9       Recent Labs  Lab 03/02/14  0120 03/01/14  0551 02/28/14  1728   WBC 6.32 6.94 6.87   HEMOGLOBIN 8.1* 9.1* 9.0*   HEMATOCRIT 27.7* 29.9* 30.4*   MCV 83.4 81.7  83.1   MCH, POC 24.4* 24.9* 24.6*   MCHC 29.2* 30.4* 29.6*   RDW 18* 18* 17*   MPV 10.1 10.0 10.0   PLATELETS 246 297 297

## 2014-03-02 NOTE — Progress Notes (Signed)
Air cabin crew note:  Pt atox3, stable, was taken to pulmonary lab via stretcher on the monitor, HR 70's, O2sat 100%2LNC, 98 %RA in PL when taken off for test, pt tolerated test well w/o any events but c/o of HA, pt was medicated for high BP prior to leaving the room but BP still high when got back to the room, pt was able to get back on the bed with help, placed back on tele, verbal report was given to bedside RN who took over care, IV intact in place with gtt's, heparin at 1000u/hr, Bicarbonate at 57ml/hr, bed in low position, call bell within reach, +floor matt in place, pt is stable at this time.

## 2014-03-02 NOTE — Plan of Care (Addendum)
Pt A&Ox4, c/o HA overnight, improved with PRN Percocet. SR on monitor, BP 150-180 overnight, CCU resident made aware, order for 1x 25mg  Hydralazine with good results. Continue on low intensity Heparin gtt and Sodium Bicarb gtt. Remains on 2L NC w/SpO2 96-98%.    Plan of Care:  PFT test today  Continue Heparin gtt  Q6h aPTT, next @ 0900  Continue Bicarb  Daily dressing changes

## 2014-03-02 NOTE — Progress Notes (Signed)
03/02/14 0700   Spirometry Pre and/or Post   Procedure Location and Type Diagnostic - Pre Only   Pre FVC 1.7 L   Pre FEV1 1.1 L   Pre FEV1/FVC % (Calculated) 65   Patient follows commands No   Cough during exhalation No   Early termination No   Leak No   Variable effort No   2 FVC 150 ml of each other Yes   2 FEV1 150 ml of each other Yes   3 maneuvers performed Yes   Patient Effort Good   Adverse Reactions None   Lung Volumes   Method Nitrogen wash out   Procedure status Completed   DLCO   Procedure status Completed   Primary Charges   $ Diffusing Capacity Done? Yes   $ Gas Dilution / Washout Lung Vol Performed? Yes   $ Spirometry Type Performed Simple - CPT 94010   Performing Departments   Diffusing Capacity Performing department formula [id number]   PLAB gas dilution performing department formula [id number]   O2 Device performing department formula [id number]

## 2014-03-02 NOTE — Plan of Care (Signed)
PGY 3, Critical Care Unit Resident     Contacted patient's primary care provider, Dr.Monsurul Welton Flakes at [phone].  Reviewed case and updated physician.  Dr. Welton Flakes disclosed that patient has history of CKD with baseline creatinine roughly 1.5 - 1.8.  He notes that patient has had multiple admissions for volume overload and hypoxic respiratory failure requiring intubation related to hypertension.      Dr. Welton Flakes notes patient has been offered coronary artery bypass graft in the past but has refused.  He agrees that the best course of action for this patient would be coronary artery bypass graft.

## 2014-03-03 ENCOUNTER — Inpatient Hospital Stay: Payer: BLUE CROSS/BLUE SHIELD

## 2014-03-03 LAB — GLUCOSE WHOLE BLOOD - POCT
Whole Blood Glucose POCT: 137 mg/dL — ABNORMAL HIGH (ref 70–100)
Whole Blood Glucose POCT: 126 mg/dL — ABNORMAL HIGH (ref 70–100)
Whole Blood Glucose POCT: 183 mg/dL — ABNORMAL HIGH (ref 70–100)

## 2014-03-03 LAB — CBC AND DIFFERENTIAL
Basophils Absolute Automated: 0.01 10*3/uL (ref 0.00–0.20)
Basophils Automated: 0 %
Eosinophils Absolute Automated: 1.03 10*3/uL — ABNORMAL HIGH (ref 0.00–0.70)
Eosinophils Automated: 16 %
Hematocrit: 27.5 % — ABNORMAL LOW (ref 37.0–47.0)
Hgb: 8.1 g/dL — ABNORMAL LOW (ref 12.0–16.0)
Immature Granulocytes Absolute: 0.04 10*3/uL
Immature Granulocytes: 1 %
Lymphocytes Absolute Automated: 2.18 10*3/uL (ref 0.50–4.40)
Lymphocytes Automated: 33 %
MCH: 24.7 pg — ABNORMAL LOW (ref 28.0–32.0)
MCHC: 29.5 g/dL — ABNORMAL LOW (ref 32.0–36.0)
MCV: 83.8 fL (ref 80.0–100.0)
MPV: 10.1 fL (ref 9.4–12.3)
Monocytes Absolute Automated: 0.74 10*3/uL (ref 0.00–1.20)
Monocytes: 11 %
Neutrophils Absolute: 2.58 10*3/uL (ref 1.80–8.10)
Neutrophils: 39 %
Nucleated RBC: 0 /100 WBC (ref 0–1)
Platelets: 242 10*3/uL (ref 140–400)
RBC: 3.28 10*6/uL — ABNORMAL LOW (ref 4.20–5.40)
RDW: 18 % — ABNORMAL HIGH (ref 12–15)
WBC: 6.58 10*3/uL (ref 3.50–10.80)

## 2014-03-03 LAB — COLD AGGLUTININ SCREEN: Cold Screen: NEGATIVE

## 2014-03-03 LAB — PT/INR
PT INR: 1 (ref 0.9–1.1)
PT: 13.6 s (ref 12.6–15.0)

## 2014-03-03 LAB — BASIC METABOLIC PANEL
BUN: 23 mg/dL — ABNORMAL HIGH (ref 7.0–19.0)
CO2: 23 mEq/L (ref 22–29)
Calcium: 8.2 mg/dL — ABNORMAL LOW (ref 8.5–10.5)
Chloride: 106 mEq/L (ref 100–111)
Creatinine: 1.4 mg/dL — ABNORMAL HIGH (ref 0.6–1.0)
Glucose: 129 mg/dL — ABNORMAL HIGH (ref 70–100)
Potassium: 4.6 mEq/L (ref 3.5–5.1)
Sodium: 136 mEq/L (ref 136–145)

## 2014-03-03 LAB — APTT
PTT: 52 s — ABNORMAL HIGH (ref 23–37)
PTT: 53 s — ABNORMAL HIGH (ref 23–37)
PTT: 56 s — ABNORMAL HIGH (ref 23–37)

## 2014-03-03 LAB — TYPE AND SCREEN
AB Screen Gel: NEGATIVE
ABO Rh: A POS

## 2014-03-03 LAB — GFR: EGFR: 45.7

## 2014-03-03 LAB — MAGNESIUM: Magnesium: 2.3 mg/dL (ref 1.6–2.6)

## 2014-03-03 LAB — HEMOGLOBIN A1C: Hemoglobin A1C: 7.5 % — ABNORMAL HIGH (ref 0.0–6.0)

## 2014-03-03 LAB — PHOSPHORUS: Phosphorus: 3.5 mg/dL (ref 2.3–4.7)

## 2014-03-03 MED ORDER — BUDESONIDE 0.5 MG/2ML IN SUSP
0.50 mg | Freq: Two times a day (BID) | RESPIRATORY_TRACT | Status: DC
Start: 2014-03-03 — End: 2014-03-04
  Administered 2014-03-03 (×2): 0.5 mg via RESPIRATORY_TRACT
  Filled 2014-03-03 (×5): qty 2

## 2014-03-03 MED ORDER — AMIODARONE HCL 200 MG PO TABS
200.00 mg | ORAL_TABLET | Freq: Two times a day (BID) | ORAL | Status: DC
Start: 2014-03-03 — End: 2014-03-04
  Administered 2014-03-03 – 2014-03-04 (×2): 200 mg via ORAL
  Filled 2014-03-03 (×2): qty 1

## 2014-03-03 MED ORDER — CHLORHEXIDINE GLUCONATE 0.12 % MT SOLN
15.0000 mL | Freq: Once | OROMUCOSAL | Status: AC
Start: 2014-03-04 — End: 2014-03-04
  Administered 2014-03-04: 15 mL via OROMUCOSAL
  Filled 2014-03-03: qty 15

## 2014-03-03 MED ORDER — ATORVASTATIN CALCIUM 10 MG PO TABS
20.00 mg | ORAL_TABLET | Freq: Every evening | ORAL | Status: DC
Start: 2014-03-03 — End: 2014-03-04
  Administered 2014-03-03: 20 mg via ORAL
  Filled 2014-03-03: qty 2

## 2014-03-03 MED ORDER — ALBUTEROL-IPRATROPIUM 2.5-0.5 (3) MG/3ML IN SOLN
3.0000 mL | Freq: Four times a day (QID) | RESPIRATORY_TRACT | Status: DC
Start: 2014-03-03 — End: 2014-03-04
  Administered 2014-03-03 – 2014-03-04 (×4): 3 mL via RESPIRATORY_TRACT
  Filled 2014-03-03 (×2): qty 3

## 2014-03-03 MED ORDER — LABETALOL HCL 5 MG/ML IV SOLN
10.00 mg | INTRAVENOUS | Status: DC | PRN
Start: 2014-03-03 — End: 2014-03-04

## 2014-03-03 MED ORDER — HYDRALAZINE HCL 25 MG PO TABS
50.0000 mg | ORAL_TABLET | Freq: Four times a day (QID) | ORAL | Status: DC
Start: 2014-03-03 — End: 2014-03-04
  Administered 2014-03-03 – 2014-03-04 (×4): 50 mg via ORAL
  Filled 2014-03-03 (×5): qty 2

## 2014-03-03 MED ORDER — TERAZOSIN HCL 1 MG PO CAPS
1.0000 mg | ORAL_CAPSULE | Freq: Every evening | ORAL | Status: DC
Start: 2014-03-03 — End: 2014-03-04
  Administered 2014-03-03: 1 mg via ORAL
  Filled 2014-03-03 (×2): qty 1

## 2014-03-03 MED ORDER — MUPIROCIN 2% NASAL OINTMENT
TOPICAL_OINTMENT | Freq: Two times a day (BID) | CUTANEOUS | Status: AC
Start: 2014-03-03 — End: 2014-03-04
  Administered 2014-03-03 – 2014-03-04 (×2): 1 via NASAL
  Filled 2014-03-03 (×2): qty 1

## 2014-03-03 NOTE — Consults (Addendum)
PRELIMINARY                    [facility]                            SURGERY CONSULTATION     Patient Name:           [name], [name] Date:             03/03/2014             Age      63  [place] MRN:              [id number]               Gender:  Female  [place] Account #:        [id number]            Race:    Black  Date of Birth:          January 09, 1951  Scheduled Surgeon:      Singh,Ramesh,M.D.  Consult Performed  By:  [facility]:               [facility]  Assessment Location:    Patient  Room     Referring Physician(s):  Rajan,Narian,M.D.     History of Present  Illness:  CV Surgery asked  to consult on 63  year old female  patient with  Multivessel Coronary  Artery Disease  and recent NSTEMI.   Patient  presented to [facility] 02/26/14  with reports  of syncope while  sitting at kitchen  table (witnessed  by her daughter).  Cardiac  Enzymes were elevated  and patient  ruled in for a NSTEMI.   Cardiac  Cath was consistent  with Multivessel  CAD.  Patient  has known CAD  with previous NSTEMI  with PCI to  Circ and RCA.  Patient  also has a  history of angioplasty,  right lower  extremity.  Patient  was noted to  have asymptomatic  UTI at [facility].   UA completed at  [facility] was also  positive for Asymptomatic  UTI.  Patient  has Chronic  Right Lower  Extremity Ulcer  and is followed in  an outpatient Wound  Clinic.  Two  sets of Blood Cultures  were drawn  02/28/14 and have  resulted no  growth x 1 day.   Patient is being  followed by Wound  Care during this  admission.   Her  past medical history  is also significant  for  Hypertension, Insulin  Dependent Diabetes,  COPD, CKD  (stage 3),  Anemia, PAD and  Remote Tobacco Abuse.     STS Risks  3.006% Risk of Mortality  25.732% Risk of  Morbidity or Mortality                       Past Medical History     Coronary Artery  Disease  Diabetes   Control:    insulin  Hypertension  COPD  Renal Disease      Chronic  Previous MI      Timing: 1 to 7 Days  Alcohol Consumption:                  None  Exercise: None  Smoking History:  Quit  > 2 years    Approximate Quit     2013  Date:  Anemia  Valve Disease  Congestive Heart  Failure  Other:                 PAD;  Chronic Right  Lower Extremity Ulcer        Past Surgical        Angioplasty  History:  Family History:      Noncontributory  Radiology and Test   CBC - WBC: 6.32;  HB: 8.1; Hct:  27.7; PLT: 246;  Review:              BMP - NA: 136;     K: 4.6;    BUN:  24.0;                       Creatinine: 1.5;  Glucose: 160                       Alk Phos: 64;   ALT: 13;    AST:  81                       TP: #1 0.15;  #2 21.49; #3: 16.96;  #4: 8.55                       UA - positive  Large Leukocytes                       Blood Cultures  of 02/28/14 - negative;  no growth                       x 1 day (2 sets)                       MRSA Screening  - negative                          Cardiac Cath  03/01/14 - Dr. Glean Hess                       1. Distal left  main, 60 % stenosis,  ostial. 2.                       Ostial circumflex,  80 % stenosis,  ulcerated. 3.                       3rd OM with ostial  lesion. 4 100  % mid RCA with                       left to right  collaterals; Chronically  occluded                       right coronary  artery stent (per  Angio 06/2013).                          Echocardiogram  03/01/14                       LEFT VENTRICLE:  Size was normal.  Ejection  fraction was  estimated to be 45  %. There was                       mild diffuse  hypokinesis. Wall                       thickness was  increased. Doppler:  There was an                       increased relative  contribution  of atrial                       contraction to  ventricular                       filling.                       AORTIC VALVE:  The valve was trileaflet.  Leaflets                       exhibited  moderately  increased  thickness,                       moderate calcification,  and  moderately reduced  cuspal separation.  Doppler:  There was moderate  stenosis. There  was no  regurgitation.  AORTA: The root  exhibited normal  size.  MITRAL VALVE: There  was mild annular  calcification. There  was minimal  thickening.  There was normal  leaflet separation.  Doppler: There was  trivial regurgitation.  LEFT ATRIUM: The  atrium was dilated.  ATRIAL SEPTUM: No  defect or patent  foramen ovale  was identified.  RIGHT VENTRICLE:  The size was normal.  Systolic  function was normal.  Wall thickness  was normal.  Doppler: The tricuspid  jet envelope definition  was inadequate  for  estimation of RV  systolic pressure.  There are no  indirect findings  (abnormal  RV volume or geometry,  altered pulmonary  flow  velocity profile,  or leftward septal  displacement) which  would suggest  moderate or severe  pulmonary hypertension.  PULMONIC VALVE:  Not well visualized.  PULMONARY ARTERY:  Not well visualized.  TRICUSPID VALVE:  The valve structure  was normal.  There was normal  leaflet separation.  Doppler:  The transtricuspid  velocity was within  the normal range.  There was  no evidence for  tricuspid stenosis.  There was no  regurgitation.  RIGHT ATRIUM: Size  was normal.  SYSTEMIC VEINS:  IVC: The inferior  vena cava was  normal in size and  course. Respirophasic  changes  were normal.  PERICARDIUM: There  was no pericardial  effusion.  The pericardium  was normal in appearance.  COMPARISONS:  No prior studies  were available for  comparison.  Prepared and signed  by  Lanette Hampshire,  MD  Signed 01-Mar-2014  13:07:32     Bilateral Carotid  Doppler 03/01/14  IMPRESSION:  Diffuse intimal  thickening, soft  plaque and  calcific plaque  in the extracranial  carotid arteries  with less  than 50% luminal  narrowing.  Geanie Cooley,  MD  03/01/2014 4:04 PM     Bilateral Lower Extremity  Vein Mapping   03/01/14  FINDINGS:  Right lower extremity  superficial  veins:  The  great saphenous vein  is  widely patent throughout,  measuring  between 2.5  and 4 mm in the calf  and  in the thigh, between  3.9 and 4.4  mm. The small  saphenous vein is  widely  patent, measuring  between 2.2 and  4.4 mm in the  calf. Note that there  is  some mild wall thickening  in the small  saphenous  vein.  Left lower extremity  superficial veins:  The  great saphenous vein  is  widely patent throughout,  measuring  between 0.7  and 1.3 mm in the  calf  and in the thigh,  between 1.2 and  3.0 mm. The  small saphenous vein  is  widely patent in  the mid to proximal  calf,  measuring between  1.1 and 1.8  mm. Note that there  is some mild wall  thickening  in the small saphenous  vein.  IMPRESSION:  1. Right and left  great saphenous  veins widely  patent throughout  as  detailed above.  2. Small saphenous  veins are widely  patent  bilaterally as detailed  above.  3. Note that there  is mild wall thickening  in  both small saphenous  veins.  Gwenyth Allegra,  MD  03/01/2014 4:07 PM     Bedside Spirometry  03/02/14  FEV1 1.1 Liters;  65% predicted     Bilateral Lower Extremity  Arterial  Doppler Study  pending  ALLERGY                  DESCRIPTION  ALLERGIES NOT ON  FILE  PENICILLINS              Drug  Class        Home Medications:     MEDICATION     DOSE   UNIT     ROUTE      FREQUENCY    COMMENT  Aspirin        81     mg       PO         Daily  Atorvastatin   10     mg       PO         Daily  Carvedilol     3.125  mg       PO         BID  Furosemide     20     mg       PO         Daily  Gabapentin     600    mg       PO         TID  Insulin Lispro                 SC         QID  Lisinopril     2.5    mg       PO         Daily  Naproxen       250    mg       PO         Daily  Venlafaxine    75     mg       PO         BID        Hospital Medications:  MEDICATION     DOSE   UNIT     ROUTE      FREQUENCY    COMMENT  Atorvastatin   80      mg       PO         Daily  Carvedilol     6.25   mg       PO         BID  Fluticasone-   230-21 mcg/act  inhaler    BID  Salmeterol  Gabapentin     600    mg       PO         TID  Hydralazine    50     mg       PO         TID  Miralax        17     g        PO         Daily  Senna          8.6    mg       PO         BID  Tiotropium     18     mcg      inhaler    Daily  Venlafaxine    75     mg       PO         Daily  Vitamin C      50     mg       PO         Daily  Heparin        1000   units/hrIV          cont           Physical Examination     HR:     78   BP      161/7BP            RR: 20   O2     98 % Temp: 98.3               Right:  5     Left:                 Sat:  Weight: 159.31   72.25  kgHeight:  61.5   156.21  cm          lbs                in        Review of Systems:  Respiratory  Denies shortness  of breath and  dyspnea on exertion.  CV           Denies chest  pain.  Abdomen      Denies abdominal  pain.  Extremities  Reports right  lower leg ulcer;  states she has  been               followed in  a Wound Care Clinic  in [place],  [place].  NeurologicalNo  gross motor  or sensory defects.  Denies dizziness,               lightheadedness,  sudden vision  change.     Physical Exam  General:        Well  Nourished, well-developed,  in no  acute distress.  Patient  lying in bed  during surgical  consultation.  Skin:           Warm  and dry. Chronic  Right Lower Extremity  Ulcer in                  Bandage  Heart:          S1  S2, regular rate  and rhythm, no Rubs  or Gallops.                  Grade  II-III/VI Murmur  Lungs:          Clear  bilaterally.  Neck:           No  bruit  Abdomen:        Soft,  non-tender, with  active bowel  sounds.  Extremities:    Chronic  Right Lower  Extremity Ulcer  in Bandage.  No                  edema  or pain.     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     Patient was educated  on procedure,  risks and benefits.  Risks include  but not limited  to infection, bleeding,  MI, CVA, renal  failure,  death. The patient  was given consent  information handouts,  including  FAQs about surgical  site infections.  All questions  were answered.  The patient agrees  to proceed with  surgery.              Shanna Cisco, N.P.    electronically  signed on 03/03/2014  2:02:40  PM with status of  Approved

## 2014-03-03 NOTE — Progress Notes (Signed)
ICU/CCU Daily Progress Note    Patient's Name: [name]    Room:  ZO109/UE454-09  Attending Provider: Roxana Hires, MD  Admit Date:02/28/2014  Medical Record Number: [id number]     Date/Time: 03/03/2014 5:43 AM    63 year old female with PMH of CAD, left circumflex stent in jan 2015 and RCA stent, Diabetes, PAD with chronic right lower leg ulcer, and mild COPD, presented to [facility] with Syncope and NSTEMI, had Heart Cath which showed Triple Vessel Disease, transferred to [facility] for probable CABG in setting of active foot ulcer , had likely Type 4A NSTEMI due to PCI, troponins trending down.  Pulm and ID on board     24hr events:  Patient was hypertensive to the 180s overnight, given 1 dose of 25mg  Hydralazine, slept comfortably with percoset    Patient Active Problem List   Diagnosis   . NSTEMI (non-ST elevated myocardial infarction)   . Hypertensive urgency   . PAD (peripheral artery disease)   . Arterial leg ulcer   . Chronic obstructive pulmonary disease, unspecified COPD type   . Ischemic cardiomyopathy          VITAL SIGNS PHYSICAL EXAM   Temp:  [97.8 F (36.6 C)-98.6 F (37 C)] 98.1 F (36.7 C)  Heart Rate:  [66-88] 74  Resp Rate:  [12-64] 15  BP: (121-183)/(60-97) 166/72 mmHg  Blood Glucose:  Pulse ox:  Telemetry:       Intake/Output Summary (Last 24 hours) at 03/03/14 0550  Last data filed at 03/03/14 0500   Gross per 24 hour   Intake 2626.13 ml   Output   1975 ml   Net 651.13 ml    Physical Exam  Cons : aa03 NAD  Neuro: No focal deficits  HEENT: MMM, no sores, unchanged   Cardiac: systolic murmur at right upper sternal border unchanged , normal s1-s2, no rubs or gallops  Lungs: wheezes at bases , CTA bilaterally, improved since yesterday  Abdomen: non tender, non distended  GNF:AOZHY bandaged, chronic ischemic changes present in feet bilaterally   Skin: venous stasis changes bilaterally legs        Scheduled Meds: PRN Meds:        aspirin 81 mg Oral Once   atorvastatin 80 mg Oral Daily   carvedilol  25 mg Oral Q12H SCH   darbepoetin alfa (ARANESP) injection 100 mcg Subcutaneous Weekly   fluticasone-salmeterol 2 puff Inhalation BID   gabapentin 600 mg Oral Q8H SCH   hydrALAZINE 50 mg Oral Q8H SCH   iron sucrose 200 mg Intravenous Q24H SCH   polyethylene glycol 17 g Oral Daily   Senna 8.6 mg Oral BID   tiotropium 18 mcg Inhalation QAM   venlafaxine 75 mg Oral Q24H   vitamin C 500 mg Oral Daily       Continuous Infusions:  . heparin 86578 units in dextrose 5% 500 mL 850 Units/hr (03/03/14 0500)   . custom IV infusion builder 50 mL/hr at 03/03/14 0500      albuterol-ipratropium 3 mL Q6H PRN   dextrose 25 mL PRN   glucagon (rDNA) 1 mg PRN   heparin (porcine) 3,000 Units PRN   insulin aspart 1-5 Units PRN   LORazepam 0.5 mg Q8H PRN   oxyCODONE-acetaminophen 1 tablet Q4H PRN           Labs (last 72 hours):  Recent Labs      03/03/14   0118  03/02/14   0120  03/01/14  0551   WBC  6.58  6.32  6.94   HEMOGLOBIN  8.1*  8.1*  9.1*   HEMATOCRIT  27.5*  27.7*  29.9*     Recent Labs      03/03/14   0118  03/02/14   1828  03/02/14   1133   PTT  52*  42*  109*    Recent Labs      03/03/14   0118  03/02/14   0120  03/01/14   0551   SODIUM  136  136  137   POTASSIUM  4.6  4.6  5.2*   CHLORIDE  106  107  108   CO2  23  22  21*   BUN  23.0*  24.0*  22.0*   CREATININE  1.4*  1.5*  1.5*   GLUCOSE  129*  160*  140*   CALCIUM  8.2*  8.1*  9.2   MAGNESIUM  2.3  2.1  2.6   PHOSPHORUS  3.5  3.2  3.2                 Microbiology:   Microbiology Results     Procedure Component Value Units Date/Time    CULTURE BLOOD AEROBIC AND ANAEROBIC [[id number]] Collected:  02/28/14 2344    Specimen Information:  Blood, Venipuncture Updated:  03/03/14 0221    Narrative:      ORDER#: [id number]                                    ORDERED BY: Jacolyn Reedy  SOURCE: Blood, Venipuncture peripheral               COLLECTED:  02/28/14 23:44  ANTIBIOTICS AT COLL.:                                RECEIVED :  03/01/14 01:49  Culture Blood Aerobic and Anaerobic         PRELIM      03/03/14 02:21  03/02/14   No Growth after 1 day/s of incubation.  03/03/14   No Growth after 2 day/s of incubation.      CULTURE BLOOD AEROBIC AND ANAEROBIC [[id number]] Collected:  02/28/14 2345    Specimen Information:  Blood, Venipuncture Updated:  03/03/14 0221    Narrative:      ORDER#: [id number]                                    ORDERED BY: Jacolyn Reedy  SOURCE: Blood, Venipuncture peripheral               COLLECTED:  02/28/14 23:45  ANTIBIOTICS AT COLL.:                                RECEIVED :  03/01/14 00:22  Culture Blood Aerobic and Anaerobic        PRELIM      03/03/14 02:21  03/02/14   No Growth after 1 day/s of incubation.  03/03/14   No Growth after 2 day/s of incubation.      MRSA culture [[id number]] Collected:  02/28/14 1657    Specimen Information:  Body Fluid / Nasal/Throat ASC Admission Updated:  03/01/14 2037  Narrative:      ORDER#: [id number]                                    ORDERED BY: Junie Bame  SOURCE: Nares and Throat                             COLLECTED:  02/28/14 16:57  ANTIBIOTICS AT COLL.:                                RECEIVED :  02/28/14 19:44  Culture MRSA Surveillance                  FINAL       03/01/14 20:37  03/01/14   Negative for Methicillin Resistant Staph aureus from Nares and             Negative for Methicillin Resistant Staph aureus from Throat            Imaging:  Xr Tibia Fibula Right Ap And Lateral    03/01/2014    1. No findings of osteomyelitis involving the shafts of the tibia or fibula. 2. 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 Further evaluation of these regions should be guided by clinical parameters.  Annabell Sabal, MD  03/01/2014 11:12 AM     Xr Chest Ap Portable    02/28/2014    No acute disease.  Kennyth Lose, MD  02/28/2014 9:38 PM     US Carotid Duplex Doppler Complete    03/01/2014    Diffuse intimal thickening, soft plaque and  calcific plaque in the extracranial carotid arteries with less than 50% luminal narrowing.  Geanie Cooley, MD  03/01/2014 4:04 PM     US Venous Duplex Doppler Leg Bilateral Limited    03/01/2014      1. Right and left great saphenous veins widely patent throughout as detailed above. 2. Small saphenous veins are widely patent bilaterally as detailed above.  3. Note that there is mild wall thickening in both small saphenous veins.  Gwenyth Allegra, MD  03/01/2014 4:07 PM         Assessment and Plan:    #NSTEMI VS Type 4A MI:   -No EKG changes, no chest pain. Isolated troponin bump from 0.15 to 21 following angiography 12/14  -Coreg 25 BID   -  continue aspirin,    -continue low intensity heparin dri   - continue atorvastatin 80,   - hold ACE inhibitor with recovering AKI hold  - hold clopidogrel with possible coronary artery bypass graft this week.   -Nitro drip discontinued due to headache   - US Venous Doppler negative, US Carotids show less than 50% narrowing    - Arterial study today     #AKI 2/2 Ace Inhibitor/Prerenal    - holding Ace Inhibitor/lasix     # Multivessel CAD: 1. Distal left main, 60 % stenosis, ostial. 2. Ostial circumflex, 80 % stenosis, ulcerated. 3. 3rd OM with ostial lesion. 4 100 % mid RCA with left to right collaterals; Chronically occluded right coronary artery stent (per Angio 06/2013).   --cardiothoracic surgery is planning for surgery Friday     #PAD w/ chronic -medial right lower extremity  arterial ulcer-status post angioplasty in April 2015.   - Wound culture from 12/13 at [facility] indicates many mixed flora with no predominating organism.   -Wound Care Nurse Consulted   -ID consulted to make recs regarding CABG, feel no reason to delay surgery as no infection, suggest Vanc and Cefipime in perioperative phase     #asymptomatic proteus colonization of urine   - per ID no need to recheck urine giving lack of symptoms   -greater than 100,000 colony-forming units GNR at [facility].patient     - no symptoms    #hypertension:    - BP between 140-150 systolic   - hydralazine 50mg /q8 will consider increasing today to obtain better blood pressure control  -Coreg 25 BID    #type 2 diabetes: Insulin-dependent. H A1c = 7.1 on 12/12.       - sliding scale per home regimen       - no need for basal insulin at this time     #COPD:    -PFTs done, meets criteria for acceptable risk for surgery, per pulmopnary there recommednation is prior to surgery to discontinue Spiriva and Symbicort one day before surgery, start Budenoside and duonebs   - CXR 12/14 showed no acute findings   . Resumed Spiriva and Symbicort equivalent while in hospital    Q 6 Duonebs at present.         # Anemia:        - Iron Profile shows low iron and low iron sat      - started venofer and aranesp      - due to chronic disease and iron deficiency     #anxiety    -restarted on Venlafaxine per her home dose    #FEN: Cardiac diet, gentle IV fluid hydration       Prophylaxis:  - DVT:  Heparin   - GI:  None     Code Status: FULL    Dispo: CABG     Lines/Drains/Airways:      Safety Checklist:     DVT prophylaxis:  CHEST guideline (See page [id number]) Chemical and Mechanical   Foley: Not present   IVs:  Peripheral IV   PT/OT: Ordered   Daily CBC & or Chem ordered:  SHM/ABIM guidelines (see #5) Yes, due to clinical and lab instability   Reference for charges of common labs: CBC auto diff - $76  BMP - $99  Mg - $79        Signed by: Claud Kelp, DO  Date/Time: 03/03/2014 5:50 AM

## 2014-03-03 NOTE — Progress Notes (Signed)
CV Surgery NP with Dr. Koleen Nimrod.  Patient scheduled for CABG with Dr. Parke Poisson, Friday, March 04, 2014 (second case).  CV Surgery Consult in [place]. Pre Op orders entered in EPIC.  Bilateral Lower Extremity Arterial Dopplers pending. Per ID note 03/02/14, patient have both Vancomycin and Cefepime as Pre op Antibiotic coverage.  Orders in EPIC as Abx on call to OR for administration. Statin therapy decreased with Pre op Amiodarone therapy.

## 2014-03-03 NOTE — Plan of Care (Signed)
AOx4, BP 140's-180's systolic, started  on Hydralazine po scheduled. C/o headaches and R foot pain. Percocet tab given with moderate pain relief. Arteriogram and CXR 2 views, done. All pre-op lab orders completed. Type and screen done. Heparin gtt increased to 900 units per Heparin low intensity nomogram. Next Aptt in 12hours due @ 0400. NaHc03 gtt same rate.   R foot wound care and dressing change done.   CHG bath rendered. Pt voiding adequate, per commode, assistance needed when getting up.   Pt schedule for CABG tomorrow, second case. (0930-1000).   Pt free from fall/injury. Safety maintained.

## 2014-03-03 NOTE — Plan of Care (Signed)
Patient A&Ox4, afebrile, no complaints of pain. Anxious at beginning of shift, emotional support provided. O2 sats adequate on RA. SR on tele, BP elevated (150's systolic). Heparin gtt and Bicarb gtt continued per orders, see MAR. Patient OOB to commode. Fall and safety precautions maintained. Will cont to montior.    POC:  Next APTT due at 0830  Dressing change to right foot today  Arterial doppler study today

## 2014-03-03 NOTE — Progress Notes (Signed)
[facility], [employer].  Progress Note    Assessment:  1.  NSTEMI/3 vessel CAD (cardiac cath @ [facility]) -> transfer from [facility] for CABG consideration - OR tomorrow  2.  Acute on CRF; non oliguric; multifactorial etiology; stable renal function  3.  Metabolic acidosis (mild); non anion gap  4.  Anemia; normocytic/mildly hypochromic  5.  Htn; suboptimal control    Plan:  1.  Continue low level IVFs: 1/2ns + NaHCO3 50meq/L @ 50cc/hr  2.  Titrate Coreg from 6.25 -> 12.5/12.5mg  -> 25/25mg ; add Hytrin 1mg  po qhs  3.  Aranesp/Venofer ->    Further recommendations to follow      Thank you    Louellen Molder, MD  Office - [phone]  [employer] - [phone]  ++++++++++++++++++++++++++++++++++++++++++++++++++++++++++++++  Subjective:  No new complaints    Medications:  Scheduled Meds:  Current Facility-Administered Medications   Medication Dose Route Frequency   . albuterol-ipratropium  3 mL Nebulization Q6H SCH   . amiodarone  200 mg Oral Q12H   . aspirin  81 mg Oral Once   . atorvastatin  20 mg Oral QHS   . budesonide  0.5 mg Nebulization BID   . carvedilol  25 mg Oral Q12H SCH   . darbepoetin alfa (ARANESP) injection  100 mcg Subcutaneous Weekly   . gabapentin  600 mg Oral Q8H SCH   . hydrALAZINE  50 mg Oral Q6H   . iron sucrose  200 mg Intravenous Q24H SCH   . mupirocin   Nasal BID   . polyethylene glycol  17 g Oral Daily   . Senna  8.6 mg Oral BID   . terazosin  1 mg Oral QHS   . venlafaxine  75 mg Oral Q24H   . vitamin C  500 mg Oral Daily     Continuous Infusions:  . heparin 91478 units in dextrose 5% 500 mL 900 Units/hr (03/03/14 1500)   . custom IV infusion builder 50 mL/hr at 03/03/14 1500     PRN Meds:dextrose, glucagon (rDNA), heparin (porcine), insulin aspart, LORazepam, oxyCODONE-acetaminophen    Objective:  Vital signs in last 24 hours:  Temp:  [98.1 F (36.7 C)-98.9 F (37.2 C)] 98.9 F (37.2 C)  Heart Rate:  [66-95] 78  Resp Rate:  [12-64] 24  BP: (139-183)/(63-87)  183/74 mmHg  Intake/Output last 24 hours:    Intake/Output Summary (Last 24 hours) at 03/03/14 1701  Last data filed at 03/03/14 1500   Gross per 24 hour   Intake 2544.9 ml   Output   2700 ml   Net -155.1 ml     Intake/Output this shift:  I/O this shift:  In: 1342.67 [P.O.:650; I.V.:672.67; IV Piggyback:20]  Out: 1450 [Urine:1450]    Physical Exam:   Gen: WD WN NAD   CV: S1 S2 N RRR   Chest: No rales/no rhonchi   Ab: ND NT soft no HSM +BS   Ext: No edema    Labs:    Recent Labs  Lab 03/03/14  0118 03/02/14  0120 03/01/14  0551 02/28/14  1728   GLUCOSE 129* 160* 140* 138*   BUN 23.0* 24.0* 22.0* 24.0*   CREATININE 1.4* 1.5* 1.5* 1.3*   CALCIUM 8.2* 8.1* 9.2 8.8   SODIUM 136 136 137 138   POTASSIUM 4.6 4.6 5.2* 4.8   CHLORIDE 106 107 108 108   CO2 23 22 21* 23   ALBUMIN  --   --   --  2.7*   PHOSPHORUS 3.5 3.2 3.2 2.7   MAGNESIUM 2.3 2.1 2.6 1.9       Recent Labs  Lab 03/03/14  0118 03/02/14  0120 03/01/14  0551   WBC 6.58 6.32 6.94   HEMOGLOBIN 8.1* 8.1* 9.1*   HEMATOCRIT 27.5* 27.7* 29.9*   MCV 83.8 83.4 81.7   MCH, POC 24.7* 24.4* 24.9*   MCHC 29.5* 29.2* 30.4*   RDW 18* 18* 18*   MPV 10.1 10.1 10.0   PLATELETS 242 246 297

## 2014-03-03 NOTE — Progress Notes (Signed)
PROGRESS NOTE    Date Time: 03/03/2014 9:45 AM  Patient Name: [name], [name]      Assessment:   COPD  Wheezing reported per history, no active wheezing  Spirometry/PFT Abnormality (Moderate obstruction on PFT's with moderate reduction in diffusing capacity.)  Multivessel CAD, EF 45%, moderate AS  PVD    Plan:   -Place on around the clock DuoNebs/Budesonide for further optimization  -After surgery and stabilized, can resume Spiriva, Advair, and PRN Albuterol  -No need for PO Steroids  -Surgery tentatively planned Friday    Subjective:   Denies SOB.  No new complaints.  NC O2 weaned off    Full PFT's done:  FEV1=1.10 (61%), FVC=1.70 (74%), FEV1/FVC=65, DLCO=10.4 (46%), TLC=3.68 (80%), consistent with moderate obstruction with moderate reduction in diffusing capacity.    Medications:     Current Facility-Administered Medications   Medication Dose Route Frequency   . albuterol-ipratropium  3 mL Nebulization Q6H SCH   . aspirin  81 mg Oral Once   . atorvastatin  80 mg Oral Daily   . carvedilol  25 mg Oral Q12H SCH   . darbepoetin alfa (ARANESP) injection  100 mcg Subcutaneous Weekly   . fluticasone-salmeterol  2 puff Inhalation BID   . gabapentin  600 mg Oral Q8H SCH   . hydrALAZINE  50 mg Oral Q8H SCH   . iron sucrose  200 mg Intravenous Q24H SCH   . polyethylene glycol  17 g Oral Daily   . Senna  8.6 mg Oral BID   . venlafaxine  75 mg Oral Q24H   . vitamin C  500 mg Oral Daily       Review of Systems:   A comprehensive review of systems was: Negative except per HPI    Physical Exam:     Filed Vitals:    03/03/14 0900   BP: 146/65   Pulse: 85   Temp:    Resp: 14   SpO2: 97%   Tm=98.3    Intake and Output Summary (Last 24 hours) at Date Time    Intake/Output Summary (Last 24 hours) at 03/03/14 0945  Last data filed at 03/03/14 0900   Gross per 24 hour   Intake 2446.13 ml   Output   2900 ml   Net -453.87 ml       General appearance - alert, well appearing, and in no distress  Mental status - alert, oriented to person,  place, and time  Eyes - pupils equal and reactive, extraocular eye movements intact  Neck - supple, no significant adenopathy  Chest - NO WHEEZING, trace b/[name] crackles  Heart - normal rate and regular rhythm  Abdomen - soft, nontender, nondistended, no masses or organomegaly  Neurological - alert, oriented, normal speech, no focal findings or movement disorder noted  Musculoskeletal - no joint tenderness, deformity or swelling  Extremities - peripheral pulses normal, no pedal edema, no clubbing or cyanosis, RLE in pressure dressing for wound  Skin - normal coloration and turgor, no rashes, no suspicious skin lesions noted    Labs:     Results     Procedure Component Value Units Date/Time    APTT (Q6-12H PRN) [[id number]]  (Abnormal) Collected:  03/03/14 0825     PTT 53 (H) sec Updated:  03/03/14 0903     APTT Anticoag. Given w/i 48 hrs. heparin     Narrative:      Every 6 hours after heparin initiation/rate change then every  12 hours once stable  per low intensity protocol    Glucose Whole Blood - POCT [[id number]]  (Abnormal) Collected:  03/03/14 0753     POCT - Glucose Whole blood 137 (H) mg/dL Updated:  65/78/46 9629    CULTURE BLOOD AEROBIC AND ANAEROBIC [[id number]] Collected:  02/28/14 2344    Specimen Information:  Blood, Venipuncture Updated:  03/03/14 0221    Narrative:      ORDER#: [id number]                                    ORDERED BY: Jacolyn Reedy  SOURCE: Blood, Venipuncture peripheral               COLLECTED:  02/28/14 23:44  ANTIBIOTICS AT COLL.:                                RECEIVED :  03/01/14 01:49  Culture Blood Aerobic and Anaerobic        PRELIM      03/03/14 02:21  03/02/14   No Growth after 1 day/s of incubation.  03/03/14   No Growth after 2 day/s of incubation.      CULTURE BLOOD AEROBIC AND ANAEROBIC [[id number]] Collected:  02/28/14 2345    Specimen Information:  Blood, Venipuncture Updated:  03/03/14 0221    Narrative:      ORDER#: [id number]                                    ORDERED BY:  Jacolyn Reedy  SOURCE: Blood, Venipuncture peripheral               COLLECTED:  02/28/14 23:45  ANTIBIOTICS AT COLL.:                                RECEIVED :  03/01/14 00:22  Culture Blood Aerobic and Anaerobic        PRELIM      03/03/14 02:21  03/02/14   No Growth after 1 day/s of incubation.  03/03/14   No Growth after 2 day/s of incubation.      APTT (Q6-12H PRN) [[id number]]  (Abnormal) Collected:  03/03/14 0118     PTT 52 (H) sec Updated:  03/03/14 0215     APTT Anticoag. Given w/i 48 hrs. heparin     Narrative:      Every 6 hours after heparin initiation/rate change then every  12 hours once stable per low intensity protocol    Basic Metabolic Panel [[id number]]  (Abnormal) Collected:  03/03/14 0118    Specimen Information:  Blood Updated:  03/03/14 0158     Glucose 129 (H) mg/dL      BUN 60.1 (H) mg/dL      Creatinine 1.4 (H) mg/dL      CALCIUM 8.2 ([name]) mg/dL      Sodium 093 mEq/[name]      Potassium 4.6 mEq/[name]      Chloride 106 mEq/[name]      CO2 23 mEq/[name]     Narrative:      Every 6 hours after heparin initiation/rate change then every  12 hours once stable per low intensity protocol    GFR [[id number]] Collected:  03/03/14 0118     EGFR 45.7  Updated:  03/03/14 0158    Narrative:      Every 6 hours after heparin initiation/rate change then every  12 hours once stable per low intensity protocol    Magnesium [[id number]] Collected:  03/03/14 0118    Specimen Information:  Blood Updated:  03/03/14 0158     Magnesium 2.3 mg/dL     Narrative:      Every 6 hours after heparin initiation/rate change then every  12 hours once stable per low intensity protocol    Phosphorus [[id number]] Collected:  03/03/14 0118    Specimen Information:  Blood Updated:  03/03/14 0158     Phosphorus 3.5 mg/dL     Narrative:      Every 6 hours after heparin initiation/rate change then every  12 hours once stable per low intensity protocol    CBC and differential [[id number]]  (Abnormal) Collected:  03/03/14 0118    Specimen Information:  Blood /  Blood Updated:  03/03/14 0151     WBC 6.58 x10 3/uL      RBC 3.28 ([name]) x10 6/uL      Hgb 8.1 ([name]) g/dL      Hematocrit 95.1 ([name]) %      MCV 83.8 fL      MCH 24.7 ([name]) pg      MCHC 29.5 ([name]) g/dL      RDW 18 (H) %      Platelets 242 x10 3/uL      MPV 10.1 fL      Neutrophils 39 %      Lymphocytes Automated 33 %      Monocytes 11 %      Eosinophils Automated 16 %      Basophils Automated 0 %      Immature Granulocyte 1 %      Nucleated RBC 0 /100 WBC      Neutrophils Absolute 2.58 x10 3/uL      Abs Lymph Automated 2.18 x10 3/uL      Abs Mono Automated 0.74 x10 3/uL      Abs Eos Automated 1.03 (H) x10 3/uL      Absolute Baso Automated 0.01 x10 3/uL      Absolute Immature Granulocyte 0.04 x10 3/uL     Narrative:      Every 6 hours after heparin initiation/rate change then every  12 hours once stable per low intensity protocol    Glucose Whole Blood - POCT [[id number]]  (Abnormal) Collected:  03/02/14 2242     POCT - Glucose Whole blood 140 (H) mg/dL Updated:  01/60/10 9323    APTT [[id number]]  (Abnormal) Collected:  03/02/14 1828     PTT 42 (H) sec Updated:  03/02/14 1853     APTT Anticoag. Given w/i 48 hrs. heparin     Glucose Whole Blood - POCT [[id number]]  (Abnormal) Collected:  03/02/14 1708     POCT - Glucose Whole blood 107 (H) mg/dL Updated:  28/31/51 7616    APTT [[id number]]  (Abnormal) Collected:  03/02/14 1133     PTT 109 (H) sec Updated:  03/02/14 1219     APTT Anticoag. Given w/i 48 hrs. heparin           Rads:   Radiological Procedure reviewed.    Xr Tibia Fibula Right Ap And Lateral    03/01/2014    1. No findings of osteomyelitis involving the shafts of the tibia or fibula. 2. 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 Further evaluation of these regions should be guided by clinical parameters.  Annabell Sabal, MD  03/01/2014 11:12 AM     Xr Chest Ap Portable    02/28/2014    No acute disease.  Kennyth Lose, MD   02/28/2014 9:38 PM     US Carotid Duplex Doppler Complete    03/01/2014    Diffuse intimal thickening, soft plaque and calcific plaque in the extracranial carotid arteries with less than 50% luminal narrowing.  Geanie Cooley, MD  03/01/2014 4:04 PM     US Venous Duplex Doppler Leg Bilateral Limited    03/01/2014      1. Right and left great saphenous veins widely patent throughout as detailed above. 2. Small saphenous veins are widely patent bilaterally as detailed above.  3. Note that there is mild wall thickening in both small saphenous veins.  Gwenyth Allegra, MD  03/01/2014 4:07 PM       Signed by: Oley Balm

## 2014-03-03 NOTE — PT Progress Note (Signed)
[facility]   Physical Therapy Treatment  Patient:  [name] MRN#:  [id number]  [facility] CCU1  Bed: FI112/FI112-01    Discharge Recommendations:   D/C Recommendations: Home with supervision   DME Recommendations: tbd closer to d/c    If Home with supervision recommended discharge disposition is not available, patient will need snf .        Assessment:   Pt with c/o of R foot pain despite pre-medication.  Pt agrees to exercises in bed and participates well.  Pt encouraged to continue exercises in bed while CABG work up being done and pt limited in mobility due to foot pain.  Pt states she can do exercises in bed.  Transport comes for pt and pt safely participates in pivot transfer on L LE.      Treatment Activities: exercise training    Educated the patient to role of physical therapy, plan of care, goals of therapy.    Plan:   PT Frequency: 2-3x/wk    Continue plan of care.       Precautions and Contraindications:   falls        Subjective:    "My foot hurts.  IT's not wrapped right" (pt going to [place] and RN states it will be rewrapped)  Patient's medical condition is appropriate for Physical Therapy intervention at this time.  Patient is agreeable to participation in the therapy session.    Pain:   Scale: 8/10  Location: r foot  Intervention: premedication from RN      Objective:   Patient is in bed with icu lines/leads in place.    Cognition  A and O x3    Functional Mobility  Rolling: Independent  Supine to Sit: Independent  Scooting: Independent  Sit to Stand: cga  Stand to Sit: cg  Transfers: cga    Balance  Static Sitting: good  Dynamic Sitting: good  Dynamic Standing: fair+    Therapeutic Exercises  Supine:  TKE x10 B   Shoulder flexion x6 B  Ankle pumps L foot    Patient Participation: good-, pain limited  Patient Endurance: not challenged    Patient left with call bell within reach, all needs met, pt leaving with transport and all questions answered. RN notified of  session outcome and patient response.     Goals:  Goals  Pt Will Perform Sit To Supine: modified independent  Pt Will Transfer Bed/Chair: with rolling walker, modified independent  Pt Will Ambulate: 51-100 feet, with rolling walker, with supervision      Doylene Bode, [place]  [id number]     Time of Treatment:  PT Received On: 03/03/14  Start Time: 0935  Stop Time: 0955  Time Calculation (min): 20 min  Treatment # 1 out of

## 2014-03-03 NOTE — Progress Notes (Signed)
RN Transport note   After consulting with MS Sager's RN Rosey Bath I agreed to take MS Mink to u/s for arterial and venous dopplers of both her legs. Of note R foot ulcer drainage visible on outside of dressing 1.5in X 1.5in est.. Overall pt did well sr 70's a few PVC's RR 16-20 SpO2 RA was 96-98%. Bp 161/75. She did c/o R foot burning 5/10 Vss charted in epic I bought pt back to CCU RM112 and gave bedside handoff to theresa pt was on monitor and had call bell when I left unit.

## 2014-03-03 NOTE — Progress Notes (Addendum)
CCU ATTENDING DAILY PROGRESS NOTE     Date Time:   03/03/2014    10:04 AM  Patient Name: [name]    LOS: 3 days     Subjective/24 Hour Events:  Awaiting CABG tomorrow    Scheduled Meds:     albuterol-ipratropium 3 mL Nebulization Q6H SCH   aspirin 81 mg Oral Once   atorvastatin 80 mg Oral Daily   budesonide 0.5 mg Nebulization BID   carvedilol 25 mg Oral Q12H SCH   darbepoetin alfa (ARANESP) injection 100 mcg Subcutaneous Weekly   fluticasone-salmeterol 2 puff Inhalation BID   gabapentin 600 mg Oral Q8H SCH   hydrALAZINE 50 mg Oral Q8H SCH   iron sucrose 200 mg Intravenous Q24H SCH   polyethylene glycol 17 g Oral Daily   Senna 8.6 mg Oral BID   venlafaxine 75 mg Oral Q24H   vitamin C 500 mg Oral Daily     Continuous Infusions:  . heparin 40981 units in dextrose 5% 500 mL 850 Units/hr (03/03/14 0900)   . custom IV infusion builder 50 mL/hr at 03/03/14 0900     PRN Meds:dextrose, glucagon (rDNA), heparin (porcine), insulin aspart, LORazepam, oxyCODONE-acetaminophen    Objective:  Physical Exam:  BP 146/65 mmHg  Pulse 85  Temp(Src) 98.3 F (36.8 C) (Oral)  Resp 14  Ht 1.562 m (5' 1.5")  Wt 72.3 kg (159 lb 6.3 oz)  BMI 29.63 kg/m2  SpO2 97%     Intake/Output Summary (Last 24 hours) at 03/03/14 1004  Last data filed at 03/03/14 0900   Gross per 24 hour   Intake 2734.13 ml   Output   2900 ml   Net -165.87 ml     General: AO3, elderly woman, pleasant  Cardiovascular:  RRR, soft 2/6 systolic murmur  Lungs:    Clear to auscultation bilaterally, without wheezing, rhonchi, or rales  Abdomen: soft, non-tender, non-distended, no hepatosplenomegaly, normoactive bowel sounds  Extremities:  Warm, RLE in bandage  Lab Review:   Recent Labs      03/03/14   0118  03/02/14   0120  03/01/14   0551   WBC  6.58  6.32  6.94   HEMOGLOBIN  8.1*  8.1*  9.1*   HEMATOCRIT  27.5*  27.7*  29.9*   PLATELETS  242  246  297     Recent Labs      03/01/14   1124  02/28/14   2344  02/28/14   1728   TROPONIN I  8.55*  16.96*  21.49*     Recent Labs      03/03/14   0118  03/02/14   0120  03/01/14   0551   SODIUM  136  136  137   POTASSIUM  4.6  4.6  5.2*   CO2  23  22  21*   BUN  23.0*  24.0*  22.0*   CREATININE  1.4*  1.5*  1.5*   GLUCOSE  129*  160*  140*   MAGNESIUM  2.3  2.1  2.6     No results for input(s): INR, TSH, CHOL, TRIG, HDL, LDL in the last 72 hours.       Imaging:  Radiology Results (24 Hour)     ** No results found for the last 24 hours. **          Assessment:    1. CAD multivessel CAD with severe LM, mildly depressed LVEF 45%  2. Moderate Aortic Stenosis, AVA 1.2  cm2  3. Chronic RLE wound, no abx, no evidence of osteo  4. CKD, CrCl improved  5. COPD  6. DM2  7. HTN  8. Anemia of Chronic Disease  9. Asymptomatic proteus colononization. ID did not recommend treatment    Plan:    1. CABG tomorrow  2 .Discuss HCO gtt with renal   4. Keep heparin gtt low intensity  6. Cont aspirin and atorvastatin 80mg   7. Start Duonebs/budesonide  8. Hydralazine prn        Signed by: Precious Gilding, MD  Spectralink [phone]

## 2014-03-04 ENCOUNTER — Inpatient Hospital Stay: Payer: BLUE CROSS/BLUE SHIELD | Admitting: Anesthesiology

## 2014-03-04 ENCOUNTER — Encounter
Admission: AD | Disposition: A | Discharge status: Rehabilitation Facility | Payer: BLUE CROSS/BLUE SHIELD | Source: Other Acute Inpatient Hospital | Attending: Thoracic Surgery (Cardiothoracic Vascular Surgery)

## 2014-03-04 ENCOUNTER — Inpatient Hospital Stay: Payer: BLUE CROSS/BLUE SHIELD

## 2014-03-04 DIAGNOSIS — N189 Chronic kidney disease, unspecified: Secondary | ICD-10-CM

## 2014-03-04 HISTORY — PX: CORONARY ARTERY BYPASS: SHX3487

## 2014-03-04 HISTORY — PX: ENDOSCOPIC,VEIN HARVEST: SHX3864

## 2014-03-04 HISTORY — PX: TEE: SHX5571

## 2014-03-04 LAB — HEPARIN ASSAY
Heparin Assay Test Concentration: 3 mg/kg
Heparin Assay Test Concentration: 3.5 mg/kg
Heparin Assay Test Concentration: 2.5 mg/kg
Heparin Assay Test Concentration: 3 mg/kg
Total Protamine Dose: 0 mg
Total Protamine Dose: 0 mg
Total Protamine Dose: 0 mg
Total Protamine Dose: 0 mg
Total Units of Heparin Required: 25000
Total Units of Heparin Required: 5000
Total Units of Heparin Required: 5000
Total Units of Heparin Required: 5000

## 2014-03-04 LAB — COOXIMETRY PROFILE
Carboxyhemoglobin: 1.6 % (ref 0.0–3.0)
Carboxyhemoglobin: 1.7 % (ref 0.0–3.0)
Hematocrit Total Calculated: 21.4 % — ABNORMAL LOW (ref 37.0–47.0)
Hematocrit Total Calculated: 22.2 % — ABNORMAL LOW (ref 37.0–47.0)
Hemoglobin Total: 6.8 g/dL — ABNORMAL LOW (ref 12.0–16.0)
Hemoglobin Total: 7.1 g/dL — ABNORMAL LOW (ref 12.0–16.0)
Methemoglobin: 0.5 % (ref 0.0–3.0)
Methemoglobin: 0.7 % (ref 0.0–3.0)
O2 Content: 7.4
O2 Content: 7.7
Oxygenated Hemoglobin: 76.3 % — ABNORMAL LOW (ref 85.0–98.0)
Oxygenated Hemoglobin: 76.7 % — ABNORMAL LOW (ref 85.0–98.0)

## 2014-03-04 LAB — CBC
Hematocrit: 26 % — ABNORMAL LOW (ref 37.0–47.0)
Hgb: 7.8 g/dL — ABNORMAL LOW (ref 12.0–16.0)
MCH: 25.5 pg — ABNORMAL LOW (ref 28.0–32.0)
MCHC: 30 g/dL — ABNORMAL LOW (ref 32.0–36.0)
MCV: 85 fL (ref 80.0–100.0)
MPV: 10.5 fL (ref 9.4–12.3)
Nucleated RBC: 1 /100 WBC (ref 0–1)
Platelets: 169 10*3/uL (ref 140–400)
RBC: 3.06 10*6/uL — ABNORMAL LOW (ref 4.20–5.40)
RDW: 17 % — ABNORMAL HIGH (ref 12–15)
WBC: 9.4 10*3/uL (ref 3.50–10.80)

## 2014-03-04 LAB — ABG WITH NA/K/CA IONIZED
Arterial Total CO2: 29.4 mEq/L (ref 24.0–30.0)
Arterial Total CO2: 29.1 mEq/L (ref 24.0–30.0)
Arterial Total CO2: 29.1 mEq/L (ref 24.0–30.0)
Arterial Total CO2: 26.7 mEq/L (ref 24.0–30.0)
Arterial Total CO2: 27.3 mEq/L (ref 24.0–30.0)
Arterial Total CO2: 27.3 mEq/L (ref 24.0–30.0)
Base Excess, Arterial: 3.1 mEq/L — ABNORMAL HIGH (ref <=2.0)
Base Excess, Arterial: 3.3 mEq/L — ABNORMAL HIGH (ref <=2.0)
Base Excess, Arterial: 3.3 mEq/L — ABNORMAL HIGH (ref <=2.0)
Base Excess, Arterial: 1.9 mEq/L (ref <=2.0)
Base Excess, Arterial: 2.3 mEq/L — ABNORMAL HIGH (ref <=2.0)
Base Excess, Arterial: 1.4 mEq/L (ref <=2.0)
Calcium, Ionized: 2.3 mEq/L (ref 2.30–2.58)
Calcium, Ionized: 2.27 mEq/L — ABNORMAL LOW (ref 2.30–2.58)
Calcium, Ionized: 2.27 mEq/L — ABNORMAL LOW (ref 2.30–2.58)
Calcium, Ionized: 2.09 mEq/L — ABNORMAL LOW (ref 2.30–2.58)
Calcium, Ionized: 2.24 mEq/L — ABNORMAL LOW (ref 2.30–2.58)
Calcium, Ionized: 2.76 mEq/L — ABNORMAL HIGH (ref 2.30–2.58)
HCO3, Arterial: 28 mEq/L (ref 23.0–29.0)
HCO3, Arterial: 27.7 mEq/L (ref 23.0–29.0)
HCO3, Arterial: 27.7 mEq/L (ref 23.0–29.0)
HCO3, Arterial: 25.6 mEq/L (ref 23.0–29.0)
HCO3, Arterial: 26 mEq/L (ref 23.0–29.0)
HCO3, Arterial: 26 mEq/L (ref 23.0–29.0)
O2 Sat, Arterial: 98.8 % (ref 95.0–100.0)
O2 Sat, Arterial: >100 % (ref 95.0–100.0)
O2 Sat, Arterial: >100 % (ref 95.0–100.0)
O2 Sat, Arterial: 100 % (ref 95.0–100.0)
O2 Sat, Arterial: >100 % (ref 95.0–100.0)
O2 Sat, Arterial: >100 % (ref 95.0–100.0)
Temperature: 37
Temperature: 35.2
Temperature: 34.3
Temperature: 37
Temperature: 37
Temperature: 35.6
Whole Blood Potassium: 4.2 mEq/L (ref 3.5–5.3)
Whole Blood Potassium: 4.1 mEq/L (ref 3.5–5.3)
Whole Blood Potassium: 4 mEq/L (ref 3.5–5.3)
Whole Blood Potassium: 3.9 mEq/L (ref 3.5–5.3)
Whole Blood Potassium: 4.3 mEq/L (ref 3.5–5.3)
Whole Blood Potassium: 4.1 mEq/L (ref 3.5–5.3)
Whole Blood Sodium: 141 mEq/L (ref 136–146)
Whole Blood Sodium: 139 mEq/L (ref 136–146)
Whole Blood Sodium: 140 mEq/L (ref 136–146)
Whole Blood Sodium: 137 mEq/L (ref 136–146)
Whole Blood Sodium: 138 mEq/L (ref 136–146)
Whole Blood Sodium: 139 mEq/L (ref 136–146)
pCO2, Arterial: 47.5 mmhg — ABNORMAL HIGH (ref 35.0–45.0)
pCO2, Arterial: 40.5 mmhg (ref 35.0–45.0)
pCO2, Arterial: 39.1 mmhg (ref 35.0–45.0)
pCO2, Arterial: 35.6 mmhg (ref 35.0–45.0)
pCO2, Arterial: 41.5 mmhg (ref 35.0–45.0)
pCO2, Arterial: 41.4 mmhg (ref 35.0–45.0)
pH, Arterial: 7.388 (ref 7.350–7.450)
pH, Arterial: 7.44 (ref 7.350–7.450)
pH, Arterial: 7.45 (ref 7.350–7.450)
pH, Arterial: 7.414 (ref 7.350–7.450)
pH, Arterial: 7.471 — ABNORMAL HIGH (ref 7.350–7.450)
pH, Arterial: 7.406 (ref 7.350–7.450)
pO2, Arterial: 279 mmhg — ABNORMAL HIGH (ref 80.0–90.0)
pO2, Arterial: 457 mmhg — ABNORMAL HIGH (ref 80.0–90.0)
pO2, Arterial: 461 mmhg — ABNORMAL HIGH (ref 80.0–90.0)
pO2, Arterial: 210 mmhg — ABNORMAL HIGH (ref 80.0–90.0)
pO2, Arterial: 340 mmhg — ABNORMAL HIGH (ref 80.0–90.0)
pO2, Arterial: 428 mmhg — ABNORMAL HIGH (ref 80.0–90.0)

## 2014-03-04 LAB — TOTAL HEMOGLOBIN GROUP
Hematocrit Total Calculated: 24.6 % — ABNORMAL LOW (ref 37.0–47.0)
Hematocrit Total Calculated: 22.1 % — ABNORMAL LOW (ref 37.0–47.0)
Hematocrit Total Calculated: 21.1 % — ABNORMAL LOW (ref 37.0–47.0)
Hematocrit Total Calculated: 22.6 % — ABNORMAL LOW (ref 37.0–47.0)
Hematocrit Total Calculated: 22.9 % — ABNORMAL LOW (ref 37.0–47.0)
Hematocrit Total Calculated: 23.3 % — ABNORMAL LOW (ref 37.0–47.0)
Hemoglobin Total: 7.9 g/dL — ABNORMAL LOW (ref 12.0–16.0)
Hemoglobin Total: 7.1 g/dL — ABNORMAL LOW (ref 12.0–16.0)
Hemoglobin Total: 6.7 g/dL — ABNORMAL LOW (ref 12.0–16.0)
Hemoglobin Total: 7.2 g/dL — ABNORMAL LOW (ref 12.0–16.0)
Hemoglobin Total: 7.3 g/dL — ABNORMAL LOW (ref 12.0–16.0)
Hemoglobin Total: 7.5 g/dL — ABNORMAL LOW (ref 12.0–16.0)

## 2014-03-04 LAB — RED BLOOD CELLS OR HOLD
Expiration Date: 201601082359
Expiration Date: 201601082359
Expiration Date: 201601052359
Expiration Date: 201601082359
Status: TRANSFUSED
UTYPE: A POS
UTYPE: A POS
UTYPE: A POS
UTYPE: A POS

## 2014-03-04 LAB — BLOOD GAS, ARTERIAL
Arterial Total CO2: 25.6 mEq/L (ref 24.0–30.0)
Arterial Total CO2: 25.6 mEq/L (ref 24.0–30.0)
Arterial Total CO2: 26.8 mEq/L (ref 24.0–30.0)
Arterial Total CO2: 26.9 mEq/L (ref 24.0–30.0)
Base Excess, Arterial: -0.2 mEq/L (ref <=2.0)
Base Excess, Arterial: -1.1 mEq/L (ref <=2.0)
Base Excess, Arterial: -1.4 mEq/L (ref <=2.0)
Base Excess, Arterial: -1.7 mEq/L (ref <=2.0)
FIO2: 100 %
FIO2: 40 %
FIO2: 40 %
FIO2: 40 %
HCO3, Arterial: 24.1 mEq/L (ref 23.0–29.0)
HCO3, Arterial: 24.2 mEq/L (ref 23.0–29.0)
HCO3, Arterial: 25.1 mEq/L (ref 23.0–29.0)
HCO3, Arterial: 25.4 mEq/L (ref 23.0–29.0)
Minute Ventilation: 4.3 L/min
O2 Sat, Arterial: 100 % (ref 95.0–100.0)
O2 Sat, Arterial: 99.2 % (ref 95.0–100.0)
O2 Sat, Arterial: 99.3 % (ref 95.0–100.0)
O2 Sat, Arterial: 99.7 % (ref 95.0–100.0)
PEEP: 5
PEEP: 5
PEEP: 5
PEEP: 5
Pressure Control: 17
Pressure Support: 5
Pressure Support: 5
Rate: 10
Rate: 11
Rate: 11
Rate: 12
Temperature: 33.9
Temperature: 35.3
Temperature: 36.2
Temperature: 36.3
Tidal vol.: 420
Tidal vol.: 420
Tidal vol.: 420
Tidal vol.: 420
pCO2, Arterial: 42.1 mmhg (ref 35.0–45.0)
pCO2, Arterial: 44.5 mmhg (ref 35.0–45.0)
pCO2, Arterial: 46.2 mmhg — ABNORMAL HIGH (ref 35.0–45.0)
pCO2, Arterial: 53.2 mmhg — ABNORMAL HIGH (ref 35.0–45.0)
pH, Arterial: 7.283 — ABNORMAL LOW (ref 7.350–7.450)
pH, Arterial: 7.334 — ABNORMAL LOW (ref 7.350–7.450)
pH, Arterial: 7.349 — ABNORMAL LOW (ref 7.350–7.450)
pH, Arterial: 7.38 (ref 7.350–7.450)
pO2, Arterial: 135 mmhg — ABNORMAL HIGH (ref 80.0–90.0)
pO2, Arterial: 136 mmhg — ABNORMAL HIGH (ref 80.0–90.0)
pO2, Arterial: 244 mmhg — ABNORMAL HIGH (ref 80.0–90.0)
pO2, Arterial: 395 mmhg — ABNORMAL HIGH (ref 80.0–90.0)

## 2014-03-04 LAB — BASIC METABOLIC PANEL
BUN: 26 mg/dL — ABNORMAL HIGH (ref 7.0–19.0)
BUN: 23 mg/dL — ABNORMAL HIGH (ref 7.0–19.0)
CO2: 28 mEq/L (ref 22–29)
CO2: 24 mEq/L (ref 22–29)
Calcium: 8.1 mg/dL — ABNORMAL LOW (ref 8.5–10.5)
Calcium: 8.6 mg/dL (ref 8.5–10.5)
Chloride: 106 mEq/L (ref 100–111)
Chloride: 107 mEq/L (ref 100–111)
Creatinine: 1.7 mg/dL — ABNORMAL HIGH (ref 0.6–1.0)
Creatinine: 1.5 mg/dL — ABNORMAL HIGH (ref 0.6–1.0)
Glucose: 122 mg/dL — ABNORMAL HIGH (ref 70–100)
Glucose: 128 mg/dL — ABNORMAL HIGH (ref 70–100)
Potassium: 4.4 mEq/L (ref 3.5–5.1)
Potassium: 4.2 mEq/L (ref 3.5–5.1)
Sodium: 137 mEq/L (ref 136–145)
Sodium: 139 mEq/L (ref 136–145)

## 2014-03-04 LAB — ACTIVATED CLOTTING TIME
ACT POCT: 205 — ABNORMAL HIGH (ref 85–120)
ACT POCT: 451 — ABNORMAL HIGH (ref 85–120)
ACT POCT: 470 — ABNORMAL HIGH (ref 85–120)
ACT POCT: 565 — ABNORMAL HIGH (ref 85–120)
ACT POCT: 166 — ABNORMAL HIGH (ref 85–120)

## 2014-03-04 LAB — TEG HEPARIN NEUTRALIZED
TEG Angle Neutralized: 75.9 (ref 55.2–78.4)
TEG CI Neutralized: 2.6 (ref <=3.00)
TEG K Time Neutralized: 1 (ref 0.8–2.8)
TEG R Time Neutralized: 6.1 (ref 2.5–7.5)
Teg MA Neutralized: 73.4 — ABNORMAL HIGH (ref 50.6–69.4)

## 2014-03-04 LAB — CBC AND DIFFERENTIAL
Basophils Absolute Automated: 0.02 10*3/uL (ref 0.00–0.20)
Basophils Automated: 0 %
Eosinophils Absolute Automated: 1.1 10*3/uL — ABNORMAL HIGH (ref 0.00–0.70)
Eosinophils Automated: 15 %
Hematocrit: 26.7 % — ABNORMAL LOW (ref 37.0–47.0)
Hgb: 7.8 g/dL — ABNORMAL LOW (ref 12.0–16.0)
Immature Granulocytes Absolute: 0.11 10*3/uL — ABNORMAL HIGH
Immature Granulocytes: 2 %
Lymphocytes Absolute Automated: 2.09 10*3/uL (ref 0.50–4.40)
Lymphocytes Automated: 29 %
MCH: 24.9 pg — ABNORMAL LOW (ref 28.0–32.0)
MCHC: 29.2 g/dL — ABNORMAL LOW (ref 32.0–36.0)
MCV: 85.3 fL (ref 80.0–100.0)
MPV: 10.1 fL (ref 9.4–12.3)
Monocytes Absolute Automated: 0.65 10*3/uL (ref 0.00–1.20)
Monocytes: 9 %
Neutrophils Absolute: 3.24 10*3/uL (ref 1.80–8.10)
Neutrophils: 45 %
Nucleated RBC: 0 /100 WBC (ref 0–1)
Platelets: 243 10*3/uL (ref 140–400)
RBC: 3.13 10*6/uL — ABNORMAL LOW (ref 4.20–5.40)
RDW: 18 % — ABNORMAL HIGH (ref 12–15)
WBC: 7.21 10*3/uL (ref 3.50–10.80)

## 2014-03-04 LAB — APTT
PTT: 53 s — ABNORMAL HIGH (ref 23–37)
PTT: 75 s — ABNORMAL HIGH (ref 23–37)
PTT: 47 s — ABNORMAL HIGH (ref 23–37)

## 2014-03-04 LAB — BLOOD GAS, VENOUS
Base Excess, Ven: 2.8 mEq/L
Base Excess, Ven: 3.1 mEq/L
HCO3, Ven: 27.5 mEq/L
HCO3, Ven: 27.7 mEq/L
O2 Sat, Venous: 78 %
O2 Sat, Venous: 78.4 %
Temperature: 37
Temperature: 37
Venous Total CO2: 28.9 mEq/L
Venous Total CO2: 29.2 mEq/L
pCO2, Venous: 44.7 mmhg
pCO2, Venous: 48.4 mmhg
pH, Ven: 7.377
pH, Ven: 7.406
pO2, Venous: 43.6 mmhg
pO2, Venous: 44.6 mmhg

## 2014-03-04 LAB — GLUCOSE WHOLE BLOOD
Whole Blood Glucose: 140 mg/dL — ABNORMAL HIGH (ref 70–100)
Whole Blood Glucose: 129 mg/dL — ABNORMAL HIGH (ref 70–100)
Whole Blood Glucose: 102 mg/dL — ABNORMAL HIGH (ref 70–100)
Whole Blood Glucose: 105 mg/dL — ABNORMAL HIGH (ref 70–100)
Whole Blood Glucose: 95 mg/dL (ref 70–100)
Whole Blood Glucose: 83 mg/dL (ref 70–100)

## 2014-03-04 LAB — PT/INR
PT INR: 1.2 — ABNORMAL HIGH (ref 0.9–1.1)
PT: 15.5 s — ABNORMAL HIGH (ref 12.6–15.0)

## 2014-03-04 LAB — GLUCOSE WHOLE BLOOD - POCT
Whole Blood Glucose POCT: 121 mg/dL — ABNORMAL HIGH (ref 70–100)
Whole Blood Glucose POCT: 126 mg/dL — ABNORMAL HIGH (ref 70–100)
Whole Blood Glucose POCT: 138 mg/dL — ABNORMAL HIGH (ref 70–100)
Whole Blood Glucose POCT: 142 mg/dL — ABNORMAL HIGH (ref 70–100)
Whole Blood Glucose POCT: 156 mg/dL — ABNORMAL HIGH (ref 70–100)

## 2014-03-04 LAB — POTASSIUM: Potassium: 4 mEq/L (ref 3.5–5.1)

## 2014-03-04 LAB — GFR
EGFR: 36.5
EGFR: 42.2

## 2014-03-04 LAB — MAGNESIUM
Magnesium: 2.1 mg/dL (ref 1.6–2.6)
Magnesium: 2.9 mg/dL — ABNORMAL HIGH (ref 1.6–2.6)
Magnesium: 2.5 mg/dL (ref 1.6–2.6)

## 2014-03-04 LAB — PHOSPHORUS: Phosphorus: 3.6 mg/dL (ref 2.3–4.7)

## 2014-03-04 SURGERY — CORONARY ARTERY BYPASS
Anesthesia: Anesthesia General | Site: Leg Upper | Wound class: Clean

## 2014-03-04 MED ORDER — LIDOCAINE HCL 2 % IJ SOLN
INTRAMUSCULAR | Status: DC | PRN
Start: 2014-03-04 — End: 2014-03-04
  Administered 2014-03-04: 50 mg
  Administered 2014-03-04: 40 mg

## 2014-03-04 MED ORDER — SODIUM CHLORIDE 0.9 % IV SOLN
INTRAVENOUS | Status: DC
Start: 2014-03-04 — End: 2014-03-06

## 2014-03-04 MED ORDER — GABAPENTIN 300 MG PO CAPS
600.00 mg | ORAL_CAPSULE | Freq: Three times a day (TID) | ORAL | Status: DC
Start: 2014-03-04 — End: 2014-03-04

## 2014-03-04 MED ORDER — VANCOMYCIN HCL 1000 MG IV SOLR
INTRAVENOUS | Status: AC
Start: 2014-03-04 — End: ?
  Filled 2014-03-04: qty 1000

## 2014-03-04 MED ORDER — NITROGLYCERIN IN D5W 200-5 MCG/ML-% IV SOLN
10.0000 ug/min | INTRAVENOUS | Status: DC
Start: 2014-03-04 — End: 2014-03-06

## 2014-03-04 MED ORDER — SODIUM CHLORIDE 0.9 % IV SOLN
100.0000 [IU] | INTRAVENOUS | Status: DC | PRN
Start: 2014-03-04 — End: 2014-03-04
  Administered 2014-03-04: 2 [IU]/h via INTRAVENOUS

## 2014-03-04 MED ORDER — NEOSTIGMINE METHYLSULFATE 1 MG/ML IJ SOLN
0.0500 mg/kg | Freq: Once | INTRAMUSCULAR | Status: AC
Start: 2014-03-04 — End: 2014-03-04
  Administered 2014-03-04: 3.64 mg via INTRAVENOUS
  Filled 2014-03-04: qty 10

## 2014-03-04 MED ORDER — NICARDIPINE IV BOLUS SYRINGE (ANESTHESIA)
INTRAVENOUS | Status: DC | PRN
Start: 2014-03-04 — End: 2014-03-04
  Administered 2014-03-04: 0.5 mg via INTRAVENOUS
  Administered 2014-03-04 (×2): 0.25 mg via INTRAVENOUS

## 2014-03-04 MED ORDER — SODIUM BICARBONATE 8.4 % IV SOLN
INTRAVENOUS | Status: DC | PRN
Start: 2014-03-04 — End: 2014-03-04
  Administered 2014-03-04: 50 mL/h via INTRAVENOUS

## 2014-03-04 MED ORDER — SUFENTANIL CITRATE 100 MCG/2ML IV SOLN
INTRAVENOUS | Status: DC | PRN
Start: 2014-03-04 — End: 2014-03-04
  Administered 2014-03-04: 50 ug via INTRAVENOUS
  Administered 2014-03-04 (×4): 25 ug via INTRAVENOUS

## 2014-03-04 MED ORDER — MIDAZOLAM HCL 2 MG/2ML IJ SOLN
INTRAMUSCULAR | Status: DC | PRN
Start: 2014-03-04 — End: 2014-03-04
  Administered 2014-03-04: 2 mg via INTRAVENOUS
  Administered 2014-03-04 (×2): 1 mg via INTRAVENOUS

## 2014-03-04 MED ORDER — MIDAZOLAM HCL 2 MG/2ML IJ SOLN
INTRAMUSCULAR | Status: AC
Start: 2014-03-04 — End: ?
  Filled 2014-03-04: qty 4

## 2014-03-04 MED ORDER — CHLORHEXIDINE GLUCONATE 0.12 % MT SOLN
15.0000 mL | Freq: Two times a day (BID) | OROMUCOSAL | Status: DC
Start: 2014-03-04 — End: 2014-03-06
  Administered 2014-03-04 – 2014-03-06 (×4): 15 mL via OROMUCOSAL
  Filled 2014-03-04 (×4): qty 15

## 2014-03-04 MED ORDER — CEFUROXIME SODIUM 1.5 G IJ/IV SOLR (WRAP)
1.5000 g | Freq: Three times a day (TID) | Status: AC
Start: 2014-03-04 — End: 2014-03-05
  Administered 2014-03-04 – 2014-03-05 (×3): 1.5 g via INTRAVENOUS
  Filled 2014-03-04 (×3): qty 1500

## 2014-03-04 MED ORDER — PROPOFOL 10 MG/ML IV EMUL
INTRAVENOUS | Status: AC
Start: 2014-03-04 — End: ?
  Filled 2014-03-04: qty 50

## 2014-03-04 MED ORDER — SODIUM PHOSPHATE 3 MMOLE/ML IV SOLN
15.00 mmol | INTRAVENOUS | Status: DC | PRN
Start: 2014-03-04 — End: 2014-03-06

## 2014-03-04 MED ORDER — ACETAMINOPHEN 325 MG PO TABS
650.0000 mg | ORAL_TABLET | ORAL | Status: DC | PRN
Start: 2014-03-04 — End: 2014-03-06

## 2014-03-04 MED ORDER — SUFENTANIL CITRATE 250 MCG/5ML IV SOLN
INTRAVENOUS | Status: AC
Start: 2014-03-04 — End: ?
  Filled 2014-03-04: qty 5

## 2014-03-04 MED ORDER — PROTAMINE SULFATE 10 MG/ML IV SOLN
INTRAVENOUS | Status: DC | PRN
Start: 2014-03-04 — End: 2014-03-04
  Administered 2014-03-04: 200 mg via INTRAVENOUS

## 2014-03-04 MED ORDER — PROTAMINE SULFATE 10 MG/ML IV SOLN
INTRAVENOUS | Status: AC
Start: 2014-03-04 — End: ?
  Filled 2014-03-04: qty 25

## 2014-03-04 MED ORDER — EPHEDRINE SULFATE 50 MG/ML IJ SOLN
INTRAMUSCULAR | Status: AC
Start: 2014-03-04 — End: ?
  Filled 2014-03-04: qty 1

## 2014-03-04 MED ORDER — LACTATED RINGERS IV SOLN
INTRAVENOUS | Status: DC | PRN
Start: 2014-03-04 — End: 2014-03-04

## 2014-03-04 MED ORDER — PROPOFOL INFUSION 10 MG/ML
INTRAVENOUS | Status: DC | PRN
Start: 2014-03-04 — End: 2014-03-04
  Administered 2014-03-04: 50 ug/kg/min via INTRAVENOUS

## 2014-03-04 MED ORDER — MUPIROCIN 2% NASAL OINTMENT
TOPICAL_OINTMENT | Freq: Two times a day (BID) | CUTANEOUS | Status: AC
Start: 2014-03-04 — End: 2014-03-09
  Administered 2014-03-04 – 2014-03-09 (×10): 1 via NASAL
  Filled 2014-03-04 (×10): qty 1

## 2014-03-04 MED ORDER — PROPOFOL INFUSION 10 MG/ML
0.00 ug/kg/min | INTRAVENOUS | Status: DC
Start: 2014-03-04 — End: 2014-03-06
  Administered 2014-03-04: 50 ug/kg/min via INTRAVENOUS

## 2014-03-04 MED ORDER — ONDANSETRON 4 MG PO TBDP
4.0000 mg | ORAL_TABLET | Freq: Three times a day (TID) | ORAL | Status: DC | PRN
Start: 2014-03-04 — End: 2014-03-06

## 2014-03-04 MED ORDER — ROCURONIUM BROMIDE 50 MG/5ML IV SOLN
INTRAVENOUS | Status: DC | PRN
Start: 2014-03-04 — End: 2014-03-04
  Administered 2014-03-04: 60 mg via INTRAVENOUS
  Administered 2014-03-04: 10 mg via INTRAVENOUS
  Administered 2014-03-04: 20 mg via INTRAVENOUS

## 2014-03-04 MED ORDER — TRAMADOL HCL 50 MG PO TABS
50.0000 mg | ORAL_TABLET | ORAL | Status: DC | PRN
Start: 2014-03-04 — End: 2014-03-06
  Administered 2014-03-05: 50 mg via ORAL
  Filled 2014-03-04: qty 1

## 2014-03-04 MED ORDER — DEXTROSE 5 % IV SOLN
20.0000 g | INTRAVENOUS | Status: DC | PRN
Start: 2014-03-04 — End: 2014-03-04
  Administered 2014-03-04: 2 g/h via INTRAVENOUS

## 2014-03-04 MED ORDER — CEFUROXIME SODIUM 1.5 G IJ/IV SOLR (WRAP)
Status: DC | PRN
Start: 2014-03-04 — End: 2014-03-04
  Administered 2014-03-04: 1.5 g via INTRAVENOUS

## 2014-03-04 MED ORDER — NITROGLYCERIN IN D5W 200-5 MCG/ML-% IV SOLN
INTRAVENOUS | Status: DC | PRN
Start: 2014-03-04 — End: 2014-03-04
  Administered 2014-03-04: 20 ug/min via INTRAVENOUS

## 2014-03-04 MED ORDER — GLYCOPYRROLATE 0.2 MG/ML IJ SOLN
0.0100 mg/kg | Freq: Once | INTRAMUSCULAR | Status: AC
Start: 2014-03-04 — End: 2014-03-04
  Administered 2014-03-04: 0.728 mg via INTRAVENOUS
  Filled 2014-03-04: qty 4

## 2014-03-04 MED ORDER — MAGNESIUM SULFATE IN D5W 10-5 MG/ML-% IV SOLN
1.00 g | INTRAVENOUS | Status: DC | PRN
Start: 2014-03-04 — End: 2014-03-06

## 2014-03-04 MED ORDER — SENNOSIDES-DOCUSATE SODIUM 8.6-50 MG PO TABS
1.0000 | ORAL_TABLET | Freq: Every evening | ORAL | Status: DC
Start: 2014-03-04 — End: 2014-03-06
  Administered 2014-03-04 – 2014-03-05 (×2): 1 via ORAL
  Filled 2014-03-04 (×2): qty 1

## 2014-03-04 MED ORDER — PROPRANOLOL HCL 10 MG PO TABS
10.0000 mg | ORAL_TABLET | Freq: Four times a day (QID) | ORAL | Status: DC
Start: 2014-03-04 — End: 2014-03-05
  Administered 2014-03-05 (×2): 10 mg via ORAL
  Filled 2014-03-04 (×2): qty 1

## 2014-03-04 MED ORDER — ALBUTEROL-IPRATROPIUM 2.5-0.5 (3) MG/3ML IN SOLN
3.00 mL | Freq: Four times a day (QID) | RESPIRATORY_TRACT | Status: DC
Start: 2014-03-04 — End: 2014-03-08
  Administered 2014-03-04 – 2014-03-08 (×8): 3 mL via RESPIRATORY_TRACT
  Filled 2014-03-04 (×14): qty 3

## 2014-03-04 MED ORDER — NOREPINEPHRINE 8 MG/100 ML (SIMPLE)
1.0000 ug/min | Status: DC
Start: 2014-03-04 — End: 2014-03-06
  Administered 2014-03-04: 1 ug/min via INTRAVENOUS

## 2014-03-04 MED ORDER — SODIUM PHOSPHATE 3 MMOLE/ML IV SOLN
25.00 mmol | INTRAVENOUS | Status: DC | PRN
Start: 2014-03-04 — End: 2014-03-06

## 2014-03-04 MED ORDER — NITROPRUSSIDE SODIUM 25 MG/ML IV SOLN
0.3000 ug/kg/min | INTRAVENOUS | Status: DC
Start: 2014-03-04 — End: 2014-03-06
  Filled 2014-03-04 (×10): qty 2

## 2014-03-04 MED ORDER — FAMOTIDINE 20 MG PO TABS
20.0000 mg | ORAL_TABLET | Freq: Two times a day (BID) | ORAL | Status: DC
Start: 2014-03-04 — End: 2014-03-06
  Administered 2014-03-04 – 2014-03-06 (×4): 20 mg via ORAL
  Filled 2014-03-04 (×4): qty 1

## 2014-03-04 MED ORDER — LUBRIFRESH P.M. OP OINT
TOPICAL_OINTMENT | OPHTHALMIC | Status: AC
Start: 2014-03-04 — End: ?
  Filled 2014-03-04: qty 3.5

## 2014-03-04 MED ORDER — CALCIUM GLUCONATE 10 % IV SOLN
1.00 g | INTRAVENOUS | Status: DC | PRN
Start: 2014-03-04 — End: 2014-03-05

## 2014-03-04 MED ORDER — DEXTROSE 50 % IV SOLN
50.00 mL | INTRAVENOUS | Status: DC | PRN
Start: 2014-03-04 — End: 2014-03-06

## 2014-03-04 MED ORDER — VEIN SOLUTION (OR ONLY) (FX)
Status: DC | PRN
Start: 2014-03-04 — End: 2014-03-04
  Administered 2014-03-04: 2

## 2014-03-04 MED ORDER — SODIUM PHOSPHATE 3 MMOLE/ML IV SOLN
35.00 mmol | INTRAVENOUS | Status: DC | PRN
Start: 2014-03-04 — End: 2014-03-06

## 2014-03-04 MED ORDER — CHLORHEXIDINE GLUCONATE 0.12 % MT SOLN
OROMUCOSAL | Status: AC
Start: 2014-03-04 — End: 2014-03-04
  Administered 2014-03-04: 15 mL via OROMUCOSAL
  Filled 2014-03-04: qty 15

## 2014-03-04 MED ORDER — ALBUMIN HUMAN 5 % IV SOLN
INTRAVENOUS | Status: DC | PRN
Start: 2014-03-04 — End: 2014-03-04

## 2014-03-04 MED ORDER — GABAPENTIN 300 MG PO CAPS
600.00 mg | ORAL_CAPSULE | Freq: Two times a day (BID) | ORAL | Status: DC
Start: 2014-03-04 — End: 2014-03-14
  Administered 2014-03-04 – 2014-03-14 (×20): 600 mg via ORAL
  Filled 2014-03-04 (×21): qty 2

## 2014-03-04 MED ORDER — ASPIRIN 325 MG PO TABS
325.00 mg | ORAL_TABLET | Freq: Every day | ORAL | Status: DC
Start: 2014-03-04 — End: 2014-03-14
  Administered 2014-03-04 – 2014-03-14 (×11): 325 mg via ORAL
  Filled 2014-03-04 (×11): qty 1

## 2014-03-04 MED ORDER — SODIUM CHLORIDE 0.9 % IV SOLN
0.00 [IU]/h | INTRAVENOUS | Status: DC
Start: 2014-03-04 — End: 2014-03-06
  Administered 2014-03-05: 1.2 [IU]/h via INTRAVENOUS
  Filled 2014-03-04: qty 1

## 2014-03-04 MED ORDER — MILRINONE LACTATE IN DEXTROSE 20-5 MG/100ML-% IV SOLN
0.3750 ug/kg/min | INTRAVENOUS | Status: DC
Start: 2014-03-04 — End: 2014-03-06

## 2014-03-04 MED ORDER — SODIUM CHLORIDE 0.9 % IV SOLN
INTRAVENOUS | Status: DC | PRN
Start: 2014-03-04 — End: 2014-03-04

## 2014-03-04 MED ORDER — PHENYLEPHRINE HCL 10 MG/ML IJ SOLN
INTRAMUSCULAR | Status: DC | PRN
Start: 2014-03-04 — End: 2014-03-04
  Administered 2014-03-04: 100 ug via INTRAVENOUS

## 2014-03-04 MED ORDER — ATORVASTATIN CALCIUM 10 MG PO TABS
10.0000 mg | ORAL_TABLET | Freq: Every evening | ORAL | Status: DC
Start: 2014-03-04 — End: 2014-03-14
  Administered 2014-03-04 – 2014-03-13 (×10): 10 mg via ORAL
  Filled 2014-03-04 (×10): qty 1

## 2014-03-04 MED ORDER — PROPOFOL INFUSION 10 MG/ML
INTRAVENOUS | Status: DC | PRN
Start: 2014-03-04 — End: 2014-03-04
  Administered 2014-03-04: 60 mg via INTRAVENOUS
  Administered 2014-03-04: 30 mg via INTRAVENOUS

## 2014-03-04 MED ORDER — MILRINONE LACTATE IN DEXTROSE 20-5 MG/100ML-% IV SOLN
INTRAVENOUS | Status: DC | PRN
Start: 2014-03-04 — End: 2014-03-04
  Administered 2014-03-04: .375 ug/kg/min via INTRAVENOUS

## 2014-03-04 MED ORDER — CEFUROXIME SODIUM 1.5 G IJ/IV SOLR (WRAP)
Status: AC
Start: 2014-03-04 — End: ?
  Filled 2014-03-04: qty 1500

## 2014-03-04 MED ORDER — VENLAFAXINE HCL 75 MG PO TABS
75.0000 mg | ORAL_TABLET | Freq: Two times a day (BID) | ORAL | Status: DC
Start: 2014-03-04 — End: 2014-03-14
  Administered 2014-03-04 – 2014-03-14 (×19): 75 mg via ORAL
  Filled 2014-03-04 (×24): qty 1

## 2014-03-04 MED ORDER — HYDRALAZINE HCL 20 MG/ML IJ SOLN
10.0000 mg | INTRAMUSCULAR | Status: DC | PRN
Start: 2014-03-04 — End: 2014-03-06

## 2014-03-04 MED ORDER — HEPARIN SODIUM (PORCINE) 1000 UNIT/ML IJ SOLN
INTRAMUSCULAR | Status: DC | PRN
Start: 2014-03-04 — End: 2014-03-04
  Administered 2014-03-04: 5000 [IU] via INTRAVENOUS
  Administered 2014-03-04: 25000 [IU] via INTRAVENOUS

## 2014-03-04 MED ORDER — HYDROMORPHONE HCL 1 MG/ML IJ SOLN
0.2500 mg | INTRAMUSCULAR | Status: DC | PRN
Start: 2014-03-04 — End: 2014-03-06
  Administered 2014-03-05 (×4): 0.25 mg via INTRAVENOUS
  Filled 2014-03-04 (×3): qty 1

## 2014-03-04 MED ORDER — ONDANSETRON HCL 4 MG/2ML IJ SOLN
4.0000 mg | Freq: Three times a day (TID) | INTRAMUSCULAR | Status: DC | PRN
Start: 2014-03-04 — End: 2014-03-06

## 2014-03-04 MED ORDER — OXYCODONE-ACETAMINOPHEN 5-325 MG PO TABS
1.0000 | ORAL_TABLET | ORAL | Status: DC | PRN
Start: 2014-03-04 — End: 2014-03-06
  Administered 2014-03-05 – 2014-03-06 (×8): 1 via ORAL
  Filled 2014-03-04 (×8): qty 1

## 2014-03-04 MED ORDER — AMIODARONE HCL 200 MG PO TABS
200.00 mg | ORAL_TABLET | Freq: Two times a day (BID) | ORAL | Status: AC
Start: 2014-03-04 — End: 2014-03-09
  Administered 2014-03-05 – 2014-03-09 (×10): 200 mg via ORAL
  Filled 2014-03-04 (×10): qty 1

## 2014-03-04 MED ORDER — BACITRACIN 50000 UNITS IM SOLR
INTRAMUSCULAR | Status: DC | PRN
Start: 2014-03-04 — End: 2014-03-04
  Administered 2014-03-04: 50000 [IU]

## 2014-03-04 MED ORDER — NITROGLYCERIN IN D5W 200-5 MCG/ML-% IV SOLN
INTRAVENOUS | Status: DC | PRN
Start: 2014-03-04 — End: 2014-03-04
  Administered 2014-03-04: 50 ug via INTRAVENOUS
  Administered 2014-03-04: 25 ug via INTRAVENOUS
  Administered 2014-03-04 (×3): 50 ug via INTRAVENOUS
  Administered 2014-03-04: 25 ug via INTRAVENOUS

## 2014-03-04 MED ORDER — SODIUM CHLORIDE 0.9 % IJ SOLN
INTRAMUSCULAR | Status: AC
Start: 2014-03-04 — End: ?
  Filled 2014-03-04: qty 10

## 2014-03-04 MED ORDER — LIDOCAINE HCL (PF) 2 % IJ SOLN
INTRAMUSCULAR | Status: AC
Start: 2014-03-04 — End: ?
  Filled 2014-03-04: qty 5

## 2014-03-04 MED ORDER — LACTATED RINGERS IV BOLUS
250.0000 mL | INTRAVENOUS | Status: DC | PRN
Start: 2014-03-04 — End: 2014-03-06

## 2014-03-04 MED ORDER — POTASSIUM CHLORIDE 20 MEQ/50ML IV SOLN
20.00 meq | INTRAVENOUS | Status: DC | PRN
Start: 2014-03-04 — End: 2014-03-06

## 2014-03-04 MED ORDER — ROCURONIUM BROMIDE 50 MG/5ML IV SOLN
INTRAVENOUS | Status: AC
Start: 2014-03-04 — End: ?
  Filled 2014-03-04: qty 10

## 2014-03-04 MED ORDER — PROPOFOL 10 MG/ML IV EMUL
INTRAVENOUS | Status: AC
Start: 2014-03-04 — End: ?
  Filled 2014-03-04: qty 20

## 2014-03-04 MED ORDER — AMINOCAPROIC ACID 250 MG/ML IV SOLN
10.0000 g | INTRAVENOUS | Status: DC | PRN
Start: 2014-03-04 — End: 2014-03-04
  Administered 2014-03-04: 10 g via INTRAVENOUS

## 2014-03-04 MED ORDER — BISACODYL 10 MG RE SUPP
10.00 mg | Freq: Every day | RECTAL | Status: DC | PRN
Start: 2014-03-04 — End: 2014-03-14
  Administered 2014-03-08: 10 mg via RECTAL
  Filled 2014-03-04: qty 1

## 2014-03-04 MED FILL — Furosemide Inj 10 MG/ML: INTRAMUSCULAR | Qty: 10 | Status: AC

## 2014-03-04 MED FILL — Phenylephrine HCl Inj 10 MG/ML: INTRAMUSCULAR | Qty: 1 | Status: AC

## 2014-03-04 MED FILL — Potassium Chloride Inj 2 mEq/ML: INTRAVENOUS | Qty: 40 | Status: AC

## 2014-03-04 MED FILL — Heparin Sodium (Porcine) Inj 1000 Unit/ML: INTRAMUSCULAR | Qty: 50 | Status: AC

## 2014-03-04 MED FILL — Sodium Bicarbonate IV Soln 8.4%: INTRAVENOUS | Qty: 50 | Status: AC

## 2014-03-04 MED FILL — Methylprednisolone Sod Succ For Inj 500 MG (Base Equiv): INTRAMUSCULAR | Qty: 16 | Status: AC

## 2014-03-04 MED FILL — Bacitracin Intramuscular For Soln 50000 Unit: INTRAMUSCULAR | Qty: 1 | Status: AC

## 2014-03-04 MED FILL — Lidocaine HCl Local Inj 1%: INTRAMUSCULAR | Qty: 20 | Status: AC

## 2014-03-04 MED FILL — Water For Inject, Bacteriostatic Benzyl Alcohol: INTRAMUSCULAR | Qty: 30 | Status: AC

## 2014-03-04 MED FILL — Magnesium Sulfate Inj 50%: INTRAMUSCULAR | Qty: 4 | Status: AC

## 2014-03-04 MED FILL — Mannitol IV Soln 25%: INTRAVENOUS | Qty: 50 | Status: AC

## 2014-03-04 MED FILL — Bacteriostatic Sodium Chloride Inj Soln 0.9%: INTRAMUSCULAR | Qty: 90 | Status: AC

## 2014-03-04 MED FILL — Electrolyte-A Solution: Qty: 1 | Status: AC

## 2014-03-04 SURGICAL SUPPLY — 161 items
ADAPTER CORONARY CARDIOPLEGIA (Adapter) ×4 IMPLANT
APPLCATOR CHLORAPREP 26ML (Prep) ×36 IMPLANT
BAG DECANTER STERILE (Procedure Accessories) ×4 IMPLANT
BANDAGE GZE CTTN MED KRLX 3.6YDX6.4IN LF (Dressing) ×1
BANDAGE KERLIX MEDIUM GAUZE L3.6 YD X (Dressing) ×3 IMPLANT
BLADE BEAVER 6900 MINI BLUE (Blade) ×4 IMPLANT
BLADE BOVIE 2.75 HEX COAT (Blade) ×8 IMPLANT
BLADE MICRO SHARP 15 DEG 3MM (Blade) ×4 IMPLANT
BLADE S/SU RIBBACK CARB STL 11 (Blade) ×4 IMPLANT
BLADE SURG 10 (Blade) ×4 IMPLANT
CABLE DISPOSABLE PACING (Cable) ×1
CABLE PACING SAFE-CONNECT L8 FT ATRIAL (Cable) ×3
CABLE PACING SAFE-CONNECT L8 FT ATRIAL VENTRICULAR SMALL ALLIGATOR (Cable) ×3 IMPLANT
CABLE PATIENT MYO/LEAD L1.8 MR BLUE (Cable) ×3 IMPLANT
CABLE PATIENT MYO/LEAD L1.8 MR WHITE (Cable) ×3 IMPLANT
CABLE PT MYOLD 1.8MR DISP BLU (Cable) ×1
CABLE PT MYOLD 1.8MR DISP WHT (Cable) ×1
CANNULA PERFUSION AORTIC OD20 FR L33.02 (Cannula) ×3
CANNULA PERFUSION AORTIC OD20 FR L33.02 CM EOPA 3D DIFFUSION TIP (Cannula) ×3 IMPLANT
CANNULA PERFUSION VENOUS OD29 FR L16 IN (Cannula) ×3
CANNULA PERFUSION VENOUS OD29 FR L16 IN THIN-FLEX STANDARD 3 STAGE 2 (Cannula) ×3 IMPLANT
CANNULA PRFSN EOPA 3D 20FR 33.02CM DFSN (Cannula) ×1
CANNULA PRFSN STD THNFLX 29FR 16IN 3 STG (Cannula) ×1
CATH URETHRAL RED RUBBER 18F (Catheter Urine) ×16 IMPLANT
CLAMP SURGICAL INSERT SOFT/TRA (Clips) ×4 IMPLANT
CLIP INTERNAL MEDIUM CHEVRON 24 (Clips) ×3 IMPLANT
CLIP INTERNAL MEDIUM CHEVRON 6 CARTRIDGE (Clips) ×3
CLIP INTERNAL MEDIUM CHEVRON 6 CARTRIDGE LIGATE TRIANGULATE CROSS (Clips) ×3 IMPLANT
CLIP INTERNAL SMALL CHEVRON 24 CARTRIDGE (Clips) ×3
CLIP INTERNAL SMALL CHEVRON 24 CARTRIDGE LIGATE TRIANGULATE CROSS (Clips) ×3 IMPLANT
CLIP INTNL TI MED CHEVRON WECK HRZN 24 (Clips) ×1
CLIP INTNL TI MED CHEVRON WECK HRZN 6 (Clips) ×1
CLIP INTNL TI SM CHEVRON WECK HRZN 24 (Clips) ×1
CLIP SPRING AVD DBL TRAC 6MM (Clips) ×4 IMPLANT
CLIP SURGICAL SPRING SOFT LIGH (Clips) ×4 IMPLANT
CONNECTOR PERFUSION 3/8IN Y TMP STERILE (Connector) ×3 IMPLANT
CONNECTOR PERFUSION EQUAL .25IN Y TMP (Connector) ×3 IMPLANT
CONNECTOR PERFUSION REDUCE 3/8IN Y STERILE (Connector) ×6 IMPLANT
CONNECTOR PERFUSION REDUCING .25IN 3/8IN STRAIGHT TMP STERILE (Connector) ×3 IMPLANT
CONNECTOR PRFSN STRG TMP .25IN 3/8IN (Connector) ×4
CONNECTOR PRFSN Y 3/8IN STRL RDC (Connector) ×8
CONNECTOR PRFSN Y TMP .25IN EQL (Connector) ×4
CONNECTOR PRFSN Y TMP 3/8IN STRL (Connector) ×4
COVER PROBE SOFT FLEX L96 IN X W6 IN GEL (Procedure Accessories) ×3
COVER PROBE SOFT FLEX L96 IN X W6 IN GEL ULTRASOUND POLYURETHANE (Procedure Accessories) ×3 IMPLANT
DERMABOND USE LAWSON 46503 (Skin Closure) ×4 IMPLANT
DEVICE BLOWER/MISTER TUBE SET CLEARVIEW N180 N185 (Perfusion Supplies) ×3 IMPLANT
DEVICE BLWR/MSTR CLRVW LF STRL TUBE SET (Perfusion Supplies) ×4
DRAIN CHEST THORACIC TUBE DRY SUCTION (Drain) ×6
DRAIN CHEST THORACIC TUBE DRY SUCTION COLLECTION CHAMBER ATRIUM OASIS (Drain) ×6 IMPLANT
DRAIN INCS PLS ATR OAS LF STRL TUBE DRY (Drain) ×2
DRAIN INCS SIL RND 24FR 5/16IN LF STRL (Drain) ×4
DRAIN OD24 FR RADIOPAQUE 4 FREE FLOW (Drain) ×12
DRAIN OD24 FR RADIOPAQUE 4 FREE FLOW CHANNEL FULL FLUTE BLAKE L5/16 IN (Drain) ×12 IMPLANT
DRAPE PROBE SOFT 6X96 (Procedure Accessories) ×1
DRAPE SLUSH MACHINE 66 X 44 (Drape) ×4 IMPLANT
DRESSING COVERLET 4X8 (Dressing) ×4 IMPLANT
DRESSING OPTIFOAM 7X7IN (Dressing) ×4 IMPLANT
FILTER TRANSFUSION BLOOD MICROAGGREGATE LW RSDL VOLUME PALL SQ40 40UM (Filter) ×3 IMPLANT
FILTER TRNSF PLSTR 40UM PALL SQ40 LF BLD (Filter) ×4
GAUZE SPONGE VERSLN 4PLY 4X4IN (Dressing) ×8 IMPLANT
GEL AQUASONIC FOIL *USE 64711 (Procedure Accessories) ×4 IMPLANT
GLOVE SRG PLISPRN 8 BGL PI INDCTR (Glove) ×2
GLOVE SURG BIOGEL LF SZ8 (Glove) ×8 IMPLANT
GLOVE SURGICAL 8 BIOGEL PI INDICATOR (Glove) ×6
GLOVE SURGICAL 8 BIOGEL PI INDICATOR UNDERGLOVE POWDER FREE SMOOTH (Glove) ×6 IMPLANT
GOWN SRG 2XL SMARTGOWN LF STRL LVL 4 (Gown) ×3
GOWN SURGICAL 2XL SMARTGOWN LEVEL 4 (Gown) ×9
GOWN SURGICAL 2XL SMARTGOWN LEVEL 4 BREATHABLE (Gown) ×9 IMPLANT
INACTIVE USE LAWSON 110816 (Procedure Accessories) ×4 IMPLANT
INHIBITOR ANTI FOG (Procedure Accessories) ×4 IMPLANT
INSERT CLAMP AVD 61MM (Vascular) ×4 IMPLANT
INSERT RETRACTOR (Retractor) ×4 IMPLANT
KIT CELL SAVER STARTER (Perfusion Supplies) ×4 IMPLANT
KIT NURSE CABG IFH (Kits) ×4 IMPLANT
LOOP VESSEL MAXI ELLIPTICAL L406 MM X W1 (Procedure Accessories)
LOOP VESSEL MAXI ELLIPTICAL L406 MM X W1 MM RED 2 POUCH RADIOPAQUE (Procedure Accessories) IMPLANT
LOOP VSL SIL MAXI ELIP STERION 406X1MM (Procedure Accessories)
NEEDLE REG BEVEL 19GX1.5IN (Needles) ×4 IMPLANT
OCTYSEAL .7G BOX (Ortho Supply) IMPLANT
PACK BAG BLOOD TRANSFUSION TRANSFER COUPLER 600ML 4R2023 (Pack) ×6 IMPLANT
PACK PERFUSION ADULT LG (Pack) ×4 IMPLANT
PACK PERFUSION ADULT SM (Pack) ×4 IMPLANT
PACK PRESTIGE CARDIOVASCULAR (Pack) ×4 IMPLANT
PACK TRANSFER W COUPLER 600ML (Pack) ×8
PAD ELECTROSRG GRND REM W CRD (Procedure Accessories) ×8 IMPLANT
PEN DISP W BLADE ELECTRODE (Cautery) ×4 IMPLANT
PEN ELECTRO PUSH BUTN HOLSTER (Cautery) ×4 IMPLANT
POSITIONER SRG URCHIN EVO HRT (Positioning Supplies) ×1
POSITIONER SURGICAL URCHIN EVO HEART (Positioning Supplies) ×3 IMPLANT
PUNCH AORTIC 4.00 MM (Procedure Accessories) ×1
PUNCH AORTIC OD4 MM ROTATE 2 CUT ACTION (Procedure Accessories) ×3
PUNCH AORTIC OD4 MM ROTATE 2 CUT ACTION LOW PROFILE CUT TIP SYRINGE (Procedure Accessories) ×3 IMPLANT
SENSOR PERFUSION LEVEL DETECTOR SARNS (Procedure Accessories) ×6 IMPLANT
SENSOR PRFSN SRN LVL DTCTR DISP (Procedure Accessories) ×2
SENSOR SHUNT 510H DISP (Shunt) ×4 IMPLANT
SET BASIN IHVI (Procedure Accessories) ×4
SET BASIN IHVI SUT12BSHFB (Procedure Accessories) ×3 IMPLANT
SET CRDPLG 50ML MPS 3/16IN 10FT STRL AD (Perfusion Supplies) ×1
SET L10 FT MPS ADDITIVE CASSETTE VENT (Perfusion Supplies) ×3
SET L10 FT MPS ADDITIVE CASSETTE VENT LINE CARDIOPLEGIA ID3/16 IN 50 (Perfusion Supplies) ×3 IMPLANT
SLEEVE CMPR MED KN LGTH KDL SCD 21- IN (Sleeve) ×4
SLEEVE COMPRESSION MEDIUM KNEE LENGTH KENDALL SEQUENTIAL OD21- IN (Sleeve) ×3 IMPLANT
SOLUTION ANTICOAG ACD 500ML (Lab Supplies) ×4
SOLUTION INTRAVENOUS FORMULA A FLEXIBLE BAG 500ML 4B7898Q (Lab Supplies) ×3 IMPLANT
SOLUTION IV 0.9% NACL 3L VFLX LF STRL (Irrigation Solutions) ×1
SOLUTION IV 0.9% SODIUM CHLORIDE 3000 ML (Irrigation Solutions) ×3 IMPLANT
SOLUTION IV PLASMA-LYTE A 1000 ML PH 7.4 (IV Solutions) ×9
SOLUTION IV PLASMA-LYTE A 1000 ML PH 7.4 TYPE 1 (IV Solutions) ×9 IMPLANT
SOLUTION IV PLSMLT A 1000ML VFLX LF PH (IV Solutions) ×3
SPNG ABSORBABLE GELATIN (Hemostat) ×4 IMPLANT
SPONGE LAP 18X18IN PREWASH WHT (Sponge) ×1
SPONGE LAPAROTOMY L18 IN X W18 IN (Sponge) ×3
SPONGE LAPAROTOMY L18 IN X W18 IN PREWASH WHITE (Sponge) ×3 IMPLANT
STABILIZER SRG OCT3 EVOL TISS (Procedure Accessories) ×1
STABILIZER TISSUE EVOLUTION OCTOPUS 3 (Procedure Accessories) ×3
STABILIZER TISSUE EVOLUTION OCTOPUS 3 SURGICAL (Procedure Accessories) ×3 IMPLANT
STOCKINETTE IMPERVIOUS MD (Drape) ×4 IMPLANT
SUTURE ABS 4-0 FS1 VCL 27IN BRD COAT UD (Suture) ×3
SUTURE COATED VICRYL 4-0 FS-1 L27 IN (Suture) ×9 IMPLANT
SUTURE NABSB 4-0 RB1 PRLN 36IN 2 ARM MFL (Suture) ×4
SUTURE NABSB 7-0 BV175-6 PRLN MTPS 24IN (Suture) ×1
SUTURE NABSB SLK 3-0 PRMHND 144IN BRD (Suture) ×1
SUTURE POLYPROPYLENE PROLENE BLUE 4-0 (Suture) ×12
SUTURE POLYPROPYLENE PROLENE BLUE 4-0 RB-1 1/2 CIRLE L36 IN 2 ARM (Suture) ×12 IMPLANT
SUTURE PROLENE 4-0 SH 36IN (Suture) ×32 IMPLANT
SUTURE PROLENE 6-0 RB2 30IN (Suture) ×12 IMPLANT
SUTURE PROLENE 7-0 BV1 24IN (Suture) ×12 IMPLANT
SUTURE PROLENE BLUE 7-0 BV175-6 L24 IN 2 (Suture) ×3
SUTURE PROLENE BLUE 7-0 BV175-6 L24 IN 2 ARM MONOFILAMENT (Suture) ×3 IMPLANT
SUTURE SILK 0.24IN (Suture) ×4 IMPLANT
SUTURE SILK 1 24IN (Suture) ×4 IMPLANT
SUTURE SILK 2-0 CT1 8X18IN (Suture) ×8 IMPLANT
SUTURE SILK 2.0 FSL 30IN (Suture) ×16 IMPLANT
SUTURE SILK PERMA HAND BLACK 3-0 L144 IN (Suture) ×3
SUTURE SILK PERMA HAND BLACK 3-0 L144 IN BRAID REEL NONABSORBABLE (Suture) ×3 IMPLANT
SUTURE VICRYL 0 CT1 36IN (Suture) ×8 IMPLANT
SUTURE VICRYL 2-0 CT1 36IN (Suture) ×4 IMPLANT
SYRINGE 20 ML BD LUER-LOK MEDICAL (Syringes, Needles) ×3 IMPLANT
SYRINGE MED 20ML LL LF STRL (Syringes, Needles) ×4
SYSTEM PROX SL HRTSTR III 3.8MM AOR CTR (Procedure Accessories) ×1
SYSTEM PROXIMAL SEAL AORTIC CUTTER (Procedure Accessories) ×3
SYSTEM PROXIMAL SEAL AORTIC CUTTER DELIVERY DEVICE LOADER HEARTSTRING (Procedure Accessories) ×3 IMPLANT
SYSTEM VASOVIEW VESSEL HARVEST (Endoscopic Supplies) ×4 IMPLANT
TAPE RETRACT O (Suture) ×4 IMPLANT
TAPE SRG CLTH MDPR 10YDX2IN LF NS STD (Tape) ×1
TAPE SURGICAL L10 YD X W2 IN STANDARD (Tape) ×3
TAPE SURGICAL L10 YD X W2 IN STANDARD MEDIPORE CLOTH (Tape) ×3 IMPLANT
TIP SCT VNYL LG ARG YNKR SLC-TRL 24FR LF (Disposable Instruments) ×1
TIP SUCTION SELEC-TROL LARGE OD24 FR (Disposable Instruments) ×3
TIP SUCTION SELEC-TROL LARGE OD24 FR VINYL (Disposable Instruments) ×3 IMPLANT
TOWEL L26 IN X W17 IN COTTON PREWASH DELINT BLUE ACTISORB DELUXE (Procedure Accessories) ×6 IMPLANT
TOWEL SRG CTTN 26X17IN LF STRL PREWASH (Procedure Accessories) ×8
TUBE BAIR HUGGER WARMING (Procedure Accessories) ×4 IMPLANT
TUBING ATS SUCTION LINE (Tubing) ×4 IMPLANT
TUBING INSUFFLATION .1 (Tubing) ×8 IMPLANT
WAX BN STRL 2.5G NTR (Hemostat) ×1
WAX BONE 2.5 G NATURAL (Hemostat) ×3 IMPLANT
WRAP CMPR POR CBN 5YDX3IN LF STRL SLFADH (Bandage) ×1
WRAP COMPRESSION L5 YD X W3 IN SELF (Bandage) ×3
WRAP COMPRESSION L5 YD X W3 IN SELF ADHERENT ELASTIC LIGHTWEIGHT HAND (Bandage) ×3 IMPLANT

## 2014-03-04 NOTE — Anesthesia Procedure Notes (Signed)
TEE      Performed by: Barbara Cower    Authorized by: Barbara Cower      Procedure Info    Indication:  Surgeon Request, Valve Function and Other(comment) (vascular eval (aorta))  TEE CPT Codes:  [phone] Adult TEE with interpretation, 93320 Doppler ECHO and 60454 Doppler ECHO (Color Flow)  History of Esophageal Stricture?: No    History of Radiation Therapy?: No    Hemoptysis?: No    Probe Placement:  Bite Block used and Atraumatic    Aorta    Asc Aortic Disease:  Difuse  Asc Aortic Atheroma:  Grade 1  Desc Aortic Disease:  Diffuse  Desc Aortic Atheroma:  Grade 2    Pericardium    Pericardial Effusion:  None  Pericardial Thickening:  None    Left Ventricle    Ejection Fraction:  45-55%  Pre-surgery RWM Anterior wall:  Mild HK  Pre-surgery RWM Lateral Wall:  Mild HK  Pre-surgery RWM Septum:  Normal  Pre-surgery RWM Inferior Wall:  Normal  Post-surgery RWM Anterior wall:  Normal  Post-surgery RWM Lateral Wall:  Normal  Post-surgery RWM Septum:  Mild HK (paced)  Post-surgery RWM Inferior Wall:  Normal    Left Atrium    Left Atrium Enlargement:  None  Pulmonary Vein Flow:  None  Left Atrial Septum:  Normal  ASD:  None  LA Appendage:  Normal    Right Ventricle    RV Size:  Normal  RV Function:  Normal    Right Atrium      Aortic Valve    Native AV Type:  Tricuspid  Native AV:  Leaflet Fusion    AVA Planimetry:  1.7  AS Grade:  Mild  AR Grade:  1+    Mitral Valve    Native Mitral Valve Type:  Normal (myx/Ca++ changes)  Native Mitral Valve:  Central Regurg    MS Grade:  None  MR Grade:  1+    Tricuspid Valve    Native Tricuspid Valve Type:  Normal  Native Tricuspid Valve:  Central Regurg  TR Grade:  1+    Intracardiac Masses

## 2014-03-04 NOTE — Progress Notes (Signed)
CV SURGERY ICU PROGRESS NOTE      POD: 1    Surgery:  1. CAB x2      EF: 45%  Surgeon: Austin Miles, MD      Assessment/Plan:   Active Problems:    NSTEMI (non-ST elevated myocardial infarction)    Hypertensive urgency    PAD (peripheral artery disease)    Arterial leg ulcer    Chronic obstructive pulmonary disease, unspecified COPD type    Ischemic cardiomyopathy      Neurological/?:    Chronic pain   History of anxiety    Cardiovascular:   CAD--s/p CAB   Moderate AS--no intervention   Hyperlipidemia--r/s statin   PCI of CX and RCA--aspirin, will discuss plavix   NSTEMI   HTN r/s BP meds as appropriate   PAD   Core Measures: Aspirin - yes; Beta blocker - yes; ACE - not yet; Statin - yes   Pacing wires: in place    Pulmonary :   COPD      Chest tubes (past 12 hours): 205   Chest X-ray: mild subsegmental atelectasis left lung    Gastrointestinal:   Nutrition: as tolerated   GI prophylaxis: pepcid    Renal/GU:   CKD   UTI   Creatinine 1.8   I/O past 24 hours: +3L   Foley: in place    Hematological:   History of anemia   Hematocrit: 28.4   Platelets: 176   DVT prophylaxis: SCDs    Infectious Disease:   Recent UTI   WBC: 11.4   Chronic RLE ulcer--follows with wound clinic   Lines: Standard post op lines    Endocrinological:   IDDM   HgbA1c: 7.5    Disposition:   Mobilize   Follow up CI with primacor wean    Subjective:   Interval History: Good first post op night      Objective:   Vital signs for last 24 hours:  Temp:  [97.8 F (36.6 C)-98.5 F (36.9 C)] 98.5 F (36.9 C)  Heart Rate:  [69-86] 86  Resp Rate:  [9-23] 13  BP: (91-184)/(54-80) 121/58 mmHg  Arterial Line BP: (94-133)/(45-54) 125/53 mmHg  FiO2:  [40 %-100 %] 40 %    Neuro: awake alert intact  Cardiac: RRR  Lungs: good aeration bilat  Abdomen: BS quiet, soft  Extremities: warm and well perfused  Wounds: dressed and dry      Invasive ICU Hemodynamics:  PAP: (25-38)/(16-23) 37/20 mmHg  CVP:  [8 mmHg-15 mmHg] 13 mmHg  CO:   [2.6 L/min-6.1 L/min] 6.1 L/min  CI:  [2.7 L/min/m2-3.5 L/min/m2] 3.5 L/min/m2    Vent Settings:  extubated    Medications:   Scheduled Meds:  Current Facility-Administered Medications   Medication Dose Route Frequency   . albuterol-ipratropium  3 mL Nebulization Q6H SCH   . amiodarone  200 mg Oral Q12H SCH   . aspirin  325 mg Oral Daily   . atorvastatin  10 mg Oral QHS   . cefuroxime  1.5 g Intravenous Q8H   . chlorhexidine  15 mL Mouth/Throat Q12H SCH   . famotidine  20 mg Oral Q12H SCH   . gabapentin  600 mg Oral Q12H SCH   . mupirocin   Nasal Q12H SCH   . propranolol  10 mg Oral 4 times per day   . senna-docusate  1 tablet Oral QHS   . venlafaxine  75 mg Oral Q12H SCH     Continuous Infusions:  .  sodium chloride 85 mL/hr at 03/05/14 0518   . insulin (regular) infusion 0.9 Units/hr (03/05/14 0500)   . milrinone Stopped (03/05/14 0500)   . nitroglycerin     . nitroprusside     . norepinephrine (LEVOPHED) infusion Stopped (03/05/14 0500)   . propofol Stopped (03/04/14 2015)         Labs:   CBC:   Recent Labs      03/05/14   0150   WBC  11.40*   HEMOGLOBIN  8.6*   HEMATOCRIT  28.4*   PLATELETS  176     BMP:   Recent Labs      03/05/14   0150   03/04/14   0336   SODIUM  137   < >  137   POTASSIUM  4.2   < >  4.4   CHLORIDE  108   < >  106   CO2  23   < >  28   BUN  25.0*   < >  26.0*   CREATININE  1.8*   < >  1.7*   GLUCOSE  112*   < >  122*   MAGNESIUM  2.7*   < >  2.1   CALCIUM  8.5   < >  8.1*   PHOSPHORUS   --    --   3.6    < > = values in this interval not displayed.     LFTs:   Recent Labs      03/05/14   0150   AST (SGOT)  30   ALT  14   ALKALINE PHOSPHATASE  47   BILIRUBIN, TOTAL  0.3   BILIRUBIN, DIRECT  0.2   BILIRUBIN, INDIRECT  0.1   PROTEIN, TOTAL  6.0   ALBUMIN  3.0*     INR:   Recent Labs      03/04/14   1921   PT  15.5*   PT INR  1.2*     ABG:   Recent Labs      03/04/14   2343   PH, ARTERIAL  7.334*   PO2, ARTERIAL  136.0*   PCO2, ARTERIAL  46.2*   HCO3, ARTERIAL  24.1   BASE EXCESS, ARTERIAL  -1.4          Microbiology (past 7 days):   NGTD      Radiology (past 7 days):   Chest xray: see above        Karie Mainland, PA-C  Cardiovascular Surgery

## 2014-03-04 NOTE — OR Nursing (Signed)
2 ventricular and 2 atrial pacing wires placed by Dr. Singh.

## 2014-03-04 NOTE — OR Nursing (Signed)
Preoperative assessment, interview, and teaching completed per CVOR standards.  Mepilex dressing applied to lumbo-sacral area.  Equator warming unit monitored and maintained by anesthesia. Tube blanket applied with sheet/ towel between skin and blanket. Unit [phone].

## 2014-03-04 NOTE — Op Note (Signed)
[facility]  Operative Report             Patient Name: [name], [name]   MRN: [id number]   DOB: 1950-07-22   Surgery Date: 03/04/2014   Performing Facility: [facility]   Admission Date: 02/28/2014   Surgeon: Junior Huezo,M.D.   Perfusionist: Vivi Ferns, C.C.P.   Ref Cardiologist: Rajan,Narian,M.D.       Procedure(s):  CABG x 2    Clinical History  CV Surgery asked to consult on 63 year old female patient with Multivessel Coronary Artery Disease and recent NSTEMI.  Patient presented to [facility] 02/26/14 with reports of syncope while sitting at kitchen table (witnessed by her daughter). Cardiac Enzymes were elevated and patient ruled in for a NSTEMI.  Cardiac Cath was consistent with Multivessel CAD.  Patient has known CAD with previous NSTEMI with PCI to Circ and RCA.  Patient also has a history of angioplasty, right lower extremity.  Patient was noted to have asymptomatic bacteuria at [facility].  UA completed at [facility] was also positive for Asymptomatic UTI.  Patient has Chronic Right Lower Extremity Ulcer and is followed in an outpatient Wound Clinic.  Two sets of Blood Cultures were drawn 02/28/14 and have resulted no growth x 1 day.  Patient is being followed by Wound Care during this admission.   Her past medical history is also significant for Hypertension, Insulin Dependent Diabetes, COPD, CKD (stage 3), Anemia, PAD and Remote Tobacco Abuse.    STS Risks   3.006% Risk of Mortality  25.732% Risk of Morbidity or Mortality            CBC - WBC: 6.32; HB: 8.1; Hct: 27.7; PLT: 246;  BMP - NA: 136;    K: 4.6;    BUN: 24.0;   Creatinine: 1.5; Glucose: 160  Alk Phos: 64;  ALT: 13;    AST: 81  TP: #1 0.15; #2 21.49; #3: 16.96; #4: 8.55  UA - positive Large Leukocytes  Blood Cultures of 02/28/14 - negative; no growth x 1 day (2 sets)  MRSA Screening - negative    Cardiac Cath 03/01/14 - Dr. Glean Hess  1. Distal left main, 60 % stenosis, ostial. 2. Ostial circumflex, 80 % stenosis,  ulcerated. 3. 3rd OM with ostial lesion. 4 100 % mid RCA with left to right collaterals; Chronically occluded right coronary artery stent (per Angio 06/2013).     Echocardiogram 03/01/14  LEFT VENTRICLE: Size was normal. Ejection fraction was estimated to be 45 %. There was mild diffuse hypokinesis. Wall  thickness was increased. Doppler: There was an increased relative contribution of atrial contraction to ventricular  filling.  AORTIC VALVE: The valve was trileaflet. Leaflets exhibited moderately increased thickness, moderate calcification, and  moderately reduced cuspal separation. Doppler: There was moderate stenosis. There was no regurgitation.  AORTA: The root exhibited normal size.  MITRAL VALVE: There was mild annular calcification. There was minimal thickening. There was normal leaflet separation.  Doppler: There was trivial regurgitation.  LEFT ATRIUM: The atrium was dilated.  ATRIAL SEPTUM: No defect or patent foramen ovale was identified.  RIGHT VENTRICLE: The size was normal. Systolic function was normal. Wall thickness was normal. Doppler: The tricuspid  jet envelope definition was inadequate for estimation of RV systolic pressure. There are no indirect findings (abnormal  RV volume or geometry, altered pulmonary flow velocity profile, or leftward septal displacement) which would suggest  moderate or severe pulmonary hypertension.  PULMONIC VALVE: Not well visualized.  PULMONARY  ARTERY: Not well visualized.  TRICUSPID VALVE: The valve structure was normal. There was normal leaflet separation. Doppler: The transtricuspid  velocity was within the normal range. There was no evidence for tricuspid stenosis. There was no regurgitation.  RIGHT ATRIUM: Size was normal.  SYSTEMIC VEINS: IVC: The inferior vena cava was normal in size and course. Respirophasic changes were normal.  PERICARDIUM: There was no pericardial effusion. The pericardium was normal in appearance.  COMPARISONS:  No prior studies were  available for comparison.  Prepared and signed by  Lanette Hampshire, MD  Signed 01-Mar-2014 13:07:32    Bilateral Carotid Doppler 03/01/14  IMPRESSION:   Diffuse intimal thickening, soft plaque and calcific plaque  in the extracranial carotid arteries with less than 50% luminal  narrowing.  Geanie Cooley, MD   03/01/2014 4:04 PM    Bilateral Lower Extremity Vein Mapping 03/01/14  FINDINGS:   Right lower extremity superficial veins: The great saphenous vein is  widely patent throughout, measuring between 2.5 and 4 mm in the calf and  in the thigh, between 3.9 and 4.4 mm. The small saphenous vein is widely  patent, measuring between 2.2 and 4.4 mm in the calf. Note that there is  some mild wall thickening in the small saphenous vein.  Left lower extremity superficial veins: The great saphenous vein is  widely patent throughout, measuring between 0.7 and 1.3 mm in the calf  and in the thigh, between 1.2 and 3.0 mm. The small saphenous vein is  widely patent in the mid to proximal calf, measuring between 1.1 and 1.8  mm. Note that there is some mild wall thickening in the small saphenous  vein.  IMPRESSION:   1. Right and left great saphenous veins widely patent throughout as  detailed above.  2. Small saphenous veins are widely patent bilaterally as detailed  above.   3. Note that there is mild wall thickening in both small saphenous  veins.  Gwenyth Allegra, MD   03/01/2014 4:07 PM    Bedside Spirometry 03/02/14  FEV1 1.1 Liters; 65% predicted    Bilateral Lower Extremity Arterial Doppler Study pending                      Description of Early Procedure  The patient was brought to the operating room for urgent surgery and placed in the supine position.  General anesthesia was induced.  All monitoring lines were placed percutaneously.  The neck, chest, abdomen and lower extremities were prepped and draped in the usual sterile fashion.  The operative approach in this case was that of full-median sternotomy and  cardiopulmonary bypass.  The pericardium was opened and suspended.      Operative Technique  Using epiaortic ultrasound, the aorta was determined to contain scatter atheromas and large pos plaques precluding use of aortic cross clamp..  The following arterial and venous conduits were obtained:    Bypass Graft Obtained Quality Harvest Method   left leg good endo   left mammary artery good pedicled     All arterial small branches were controlled with metal clips.  Small branches on the vein graft were controlled with ligatures and clips.  The patient was then fully heparinized, and the conduits were prepared for grafting.  Arterial cannulation was accomplished.  Venous cannulation was accomplished in the right atrium using simple two stage cannulation cannula.  The patient was connected to the heart/lung machine. The rest of the operation was now performed with beating heart on  bypass Coronary bypass grafting was performed as noted below. Foot stabilisers and urchin were used. Silastic tapes were used. Prox anastomosis was done with HeartString device.               Distal  Distal Proximal       Artery   Anastomosis Anastomosis Anastomosis Seq   Seq Vessel mm Quality Conduit Site Configuration Configuration Graft    Proximal LAD 1.75 good In Situ LIMA Mid LAD End to side      Circumflex 1.75 fair Vein graft Obtuse Marginal 1 End to side               Closing Phase  Pacing wires were placed in the atrium and the ventricle.  After adequate rewarming, the patient was then weaned from cardiopulmonary bypass.  The heparin was reversed with protamine.  Vessel patency was assessed by intraoperative doppler and found to be satisfactory.  Cardiac rhythm post-bypass was normal sinus rhythm.  The patient was decannulated, and following meticulous hemostasis, the sternum was approximated with interrupted #5 stainless steel wire while the linea alba and fascia were closed with Vicryl.  Hemodynamic stability was achieved and the  chest was closed in layers after leaving mediastinal, right, and left pleural tubes in place.  Sponge and needle counts were correct and the patient was transferred to CVICU with stable vital signs.  A message was conveyed to the family that the operation was completed.

## 2014-03-04 NOTE — Progress Notes (Signed)
PROGRESS NOTE    Date Time: 03/04/2014 9:18 AM  Patient Name: [name], [name]      Assessment:   COPD  Wheezing reported per history, no active wheezing  Spirometry/PFT Abnormality (Moderate obstruction on PFT's with moderate reduction in diffusing capacity.)  Multivessel CAD, EF 45%, moderate AS  PVD  CKD with metabolic acidosis    Plan:   -Agree with around the clock DuoNebs/Budesonide for pre-operative optimization  -After surgery and stabilized, can resume Spiriva, Advair, and PRN Albuterol  -No need for PO Steroids  -On Heparin drip for CAD  -On Bicarb drip for CKD/acidosis  -Surgery tentatively planned today    Subjective:   Denies SOB.  No new complaints.  Remains on RA    Full PFT's :  FEV1=1.10 (61%), FVC=1.70 (74%), FEV1/FVC=65, DLCO=10.4 (46%), TLC=3.68 (80%), consistent with moderate obstruction with moderate reduction in diffusing capacity.    Medications:     Current Facility-Administered Medications   Medication Dose Route Frequency   . albuterol-ipratropium  3 mL Nebulization Q6H SCH   . amiodarone  200 mg Oral Q12H   . aspirin  81 mg Oral Once   . atorvastatin  20 mg Oral QHS   . carvedilol  25 mg Oral Q12H SCH   . darbepoetin alfa (ARANESP) injection  100 mcg Subcutaneous Weekly   . gabapentin  600 mg Oral Q8H SCH   . hydrALAZINE  50 mg Oral Q6H   . iron sucrose  200 mg Intravenous Q24H SCH   . mupirocin   Nasal BID   . polyethylene glycol  17 g Oral Daily   . Senna  8.6 mg Oral BID   . terazosin  1 mg Oral QHS   . venlafaxine  75 mg Oral Q24H   . vitamin C  500 mg Oral Daily       Review of Systems:   A comprehensive review of systems was: Negative except per HPI    Physical Exam:     Filed Vitals:    03/04/14 0700   BP: 157/67   Pulse: 76   Temp:    Resp: 17   SpO2: 96%   Tm=98.1    Intake and Output Summary (Last 24 hours) at Date Time    Intake/Output Summary (Last 24 hours) at 03/04/14 6644  Last data filed at 03/04/14 0700   Gross per 24 hour   Intake 2665.04 ml   Output   3100 ml   Net -434.96  ml       General appearance - alert, well appearing, and in no distress  Mental status - alert, oriented to person, place, and time  Eyes - pupils equal and reactive, extraocular eye movements intact  Neck - supple, no significant adenopathy  Chest - good air entry, no wheezing, trace b/[name] crackles  Heart - normal rate and regular rhythm  Abdomen - soft, nontender, nondistended, no masses or organomegaly  Neurological - alert, oriented, normal speech, no focal findings or movement disorder noted  Musculoskeletal - no joint tenderness, deformity or swelling  Extremities - peripheral pulses normal, no pedal edema, no clubbing or cyanosis, RLE in pressure dressing for wound  Skin - normal coloration and turgor, no rashes, no suspicious skin lesions noted    Labs:     Results     Procedure Component Value Units Date/Time    Phosphorus [[id number]] Collected:  03/04/14 0336    Specimen Information:  Blood Updated:  03/04/14 0610     Phosphorus  3.6 mg/dL     Basic Metabolic Panel [[id number]]  (Abnormal) Collected:  03/04/14 0336    Specimen Information:  Blood Updated:  03/04/14 0610     Glucose 122 (H) mg/dL      BUN 40.9 (H) mg/dL      Creatinine 1.7 (H) mg/dL      CALCIUM 8.1 ([name]) mg/dL      Sodium 811 mEq/[name]      Potassium 4.4 mEq/[name]      Chloride 106 mEq/[name]      CO2 28 mEq/[name]     GFR [[id number]] Collected:  03/04/14 0336     EGFR 36.5 Updated:  03/04/14 0610    Magnesium [[id number]] Collected:  03/04/14 0336    Specimen Information:  Blood Updated:  03/04/14 0610     Magnesium 2.1 mg/dL     CBC and differential [[id number]]  (Abnormal) Collected:  03/04/14 0336    Specimen Information:  Blood / Blood Updated:  03/04/14 0602     WBC 7.21 x10 3/uL      RBC 3.13 ([name]) x10 6/uL      Hgb 7.8 ([name]) g/dL      Hematocrit 41.3 ([name]) %      MCV 85.3 fL      MCH 24.9 ([name]) pg      MCHC 29.2 ([name]) g/dL      RDW 18 (H) %      Platelets 243 x10 3/uL      MPV 10.1 fL      Neutrophils 45 %      Lymphocytes Automated 29 %      Monocytes 9 %       Eosinophils Automated 15 %      Basophils Automated 0 %      Immature Granulocyte 2 %      Nucleated RBC 0 /100 WBC      Neutrophils Absolute 3.24 x10 3/uL      Abs Lymph Automated 2.09 x10 3/uL      Abs Mono Automated 0.65 x10 3/uL      Abs Eos Automated 1.10 (H) x10 3/uL      Absolute Baso Automated 0.02 x10 3/uL      Absolute Immature Granulocyte 0.11 (H) x10 3/uL     APTT (Q6-12H PRN) [[id number]]  (Abnormal) Collected:  03/04/14 0336     PTT 53 (H) sec Updated:  03/04/14 0409     APTT Anticoag. Given w/i 48 hrs. heparin     Narrative:      Every 6 hours after heparin initiation/rate change then every  12 hours once stable per low intensity protocol    CULTURE BLOOD AEROBIC AND ANAEROBIC [[id number]] Collected:  02/28/14 2344    Specimen Information:  Blood, Venipuncture Updated:  03/04/14 0221    Narrative:      ORDER#: [id number]                                    ORDERED BY: Jacolyn Reedy  SOURCE: Blood, Venipuncture peripheral               COLLECTED:  02/28/14 23:44  ANTIBIOTICS AT COLL.:                                RECEIVED :  03/01/14 01:49  Culture Blood Aerobic and Anaerobic  PRELIM      03/04/14 02:21  03/02/14   No Growth after 1 day/s of incubation.  03/03/14   No Growth after 2 day/s of incubation.  03/04/14   No Growth after 3 day/s of incubation.      CULTURE BLOOD AEROBIC AND ANAEROBIC [[id number]] Collected:  02/28/14 2345    Specimen Information:  Blood, Venipuncture Updated:  03/04/14 0221    Narrative:      ORDER#: [id number]                                    ORDERED BY: Jacolyn Reedy  SOURCE: Blood, Venipuncture peripheral               COLLECTED:  02/28/14 23:45  ANTIBIOTICS AT COLL.:                                RECEIVED :  03/01/14 00:22  Culture Blood Aerobic and Anaerobic        PRELIM      03/04/14 02:21  03/02/14   No Growth after 1 day/s of incubation.  03/03/14   No Growth after 2 day/s of incubation.  03/04/14   No Growth after 3 day/s of incubation.      Glucose Whole Blood -  POCT [[id number]]  (Abnormal) Collected:  03/03/14 2210     POCT - Glucose Whole blood 183 (H) mg/dL Updated:  44/03/47 4259    Hemoglobin A1c [[id number]]  (Abnormal) Collected:  03/03/14 1622    Specimen Information:  Blood Updated:  03/03/14 1905     Hemoglobin A1C 7.5 (H) %     Type and screen [[id number]] Collected:  03/03/14 1622    Specimen Information:  Blood Updated:  03/03/14 1816     ABO Rh A POS      AB Screen Gel NEG     Narrative:      ?Special Requirements->Leukoreduced  ?Surgery/ Procedure (to be Performed)->CABG; Endo  ?Surgery/Procedure Date:->03/04/14  ?Is the patient pregnant?->No  ?Has the patient been transfused w/i the last 3 months?->No    RBC OR HOLD Blood Product [[id number]] Collected:  03/01/14 0547     RBC Leukoreduced RBC Leukoreduced Updated:  03/03/14 1749     BLUNIT [id number]      Status selected      PRODUCT CODE (NON READABLE) E0336V00      Expiration Date [id number]      UTYPE A POS      RBC Leukoreduced RBC Leukoreduced      BLUNIT [id number]      Status selected      PRODUCT CODE (NON READABLE) E0336V00      Expiration Date [id number]      UTYPE A POS     APTT [[id number]]  (Abnormal) Collected:  03/03/14 1622     PTT 56 (H) sec Updated:  03/03/14 1712    Protime-INR [[id number]] Collected:  03/03/14 1622    Specimen Information:  Blood Updated:  03/03/14 1709     PT 13.6 sec      PT INR 1.0      PT Anticoag. Given Within 48 hrs. heparin     Cold Agglutinin Screen [[id number]] Collected:  03/01/14 0547     Cold Screen NEG Updated:  03/03/14 1706    Glucose Whole Blood - POCT [[id number]]  (Abnormal) Collected:  03/03/14  1640     POCT - Glucose Whole blood 126 (H) mg/dL Updated:  16/10/96 0454    RBC OR HOLD Blood Product [[id number]] Collected:  03/03/14 1622     Updated:  03/03/14 1634    Narrative:      ?Special Requirements->Leukoreduced  ?Surgery/ Procedure (to be Performed)->CABG; Endo  ?Surgery/Procedure Date:->03/04/14  ?Is the patient pregnant?->No  ?Has the patient  been transfused w/i the last 3 months?->No    Cold Agglutinin Screen [[id number]] Collected:  03/03/14 1622    Specimen Information:  Blood Updated:  03/03/14 1634    Narrative:      ?Special Requirements->Leukoreduced  ?Surgery/ Procedure (to be Performed)->CABG; Endo  ?Surgery/Procedure Date:->03/04/14  ?Is the patient pregnant?->No  ?Has the patient been transfused w/i the last 3 months?->No          Rads:   Radiological Procedure reviewed.    Xr Chest 2 Views    03/03/2014    1. Clear lungs. 2. Normal heart size.  Elizebeth Koller, MD  03/03/2014 4:08 PM     Xr Tibia Fibula Right Ap And Lateral    03/01/2014    1. No findings of osteomyelitis involving the shafts of the tibia or fibula. 2. 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 Further evaluation of these regions should be guided by clinical parameters.  Annabell Sabal, MD  03/01/2014 11:12 AM     Xr Chest Ap Portable    02/28/2014    No acute disease.  Kennyth Lose, MD  02/28/2014 9:38 PM     US Carotid Duplex Doppler Complete    03/01/2014    Diffuse intimal thickening, soft plaque and calcific plaque in the extracranial carotid arteries with less than 50% luminal narrowing.  Geanie Cooley, MD  03/01/2014 4:04 PM     US Arterial / Graft Duplex Doppler Lower Extremity Bilateral Complete    03/03/2014     1. Right lower extremity: Normal ankle-brachial index 1.0 and toe brachial index 0.61. Patent right superficial femoral artery stent. Suspected 50-75% stenosis right external iliac and superficial femoral artery just distal to the stent. 2. Single peroneal runoff on the right. 3. Left lower extremity: Reduced ankle-brachial index 0.52 and toe brachial index 0.37.  Diffuse atherosclerotic disease is noted with multiple stenoses diffusely within the superficial femoral artery and significant stenosis in the tibioperoneal trunk.  4. 2 vessel runoff with  patent left anterior tibial and peroneal but with 50-75% stenosis mid peroneal.  Carmina Miller, MD  03/03/2014 4:19 PM     US Venous Duplex Doppler Leg Bilateral Limited    03/01/2014      1. Right and left great saphenous veins widely patent throughout as detailed above. 2. Small saphenous veins are widely patent bilaterally as detailed above.  3. Note that there is mild wall thickening in both small saphenous veins.  Gwenyth Allegra, MD  03/01/2014 4:07 PM       Signed by: Oley Balm

## 2014-03-04 NOTE — Progress Notes (Signed)
CV Surgery PA    Pre operative work up reviewed.   Carotid US showing no obstructive dz >50%. Vein mapping showing patent saphenous veins bilaterally, consider right leg for vein harvest. Laboratory data WNL. CXR stable, without concerning pulmonary issues.  MRSA is negative.  UA showing a UTI. Know to ID who requests not treating it.  Echo shows reduced EF of 45%, No AI or TR, trace MR.  The pt has a Hx of PAD, lower ext arterial dopplers were done showing 50-75% stenosis in the right femoral and iliac arteries and same grade stenosis on the left anterior tibial artery. These results were discussed with Dr. Thedore Mins.   Plan for OR today.    Madison Ravens, PA-C

## 2014-03-04 NOTE — Op Note (Signed)
FINAL                    [facility]                              Operative Report        Patient Name:              [name], [name]  MRN:                       [id number]  DOB:                       03-03-1951  Surgery Date:              03/04/2014  Performing Facility:       [facility]  Admission Date:            02/28/2014  Surgeon:                   Singh,Ramesh,M.D.  Perfusionist:              Vivi Ferns,  C.C.P.  Ref Cardiologist:          Rajan,Narian,M.D.        Procedure(s):    CABGx 2     Clinical History  CV Surgery asked  to consult on 63  year old female  patient with  Multivessel Coronary  Artery Disease  and recent NSTEMI.   Patient  presented to [facility] 02/26/14  with reports  of syncope while  sitting at kitchen  table (witnessed  by her daughter).  Cardiac  Enzymes were elevated  and patient  ruled in for a NSTEMI.   Cardiac  Cath was consistent  with Multivessel  CAD.  Patient  has known CAD  with previous NSTEMI  with PCI to  Circ and RCA.  Patient  also has a  history of angioplasty,  right lower  extremity.  Patient  was noted to  have asymptomatic  bacteuria at [facility].   UA completed  at [facility] was  also positive for  Asymptomatic UTI.   Patient has Chronic  Right Lower  Extremity Ulcer  and is followed in  an outpatient Wound  Clinic.  Two  sets of Blood Cultures  were drawn  02/28/14 and have  resulted no  growth x 1 day.   Patient is being  followed by Wound  Care during this  admission.   Her  past medical history  is also significant  for  Hypertension, Insulin  Dependent Diabetes,  COPD, CKD  (stage 3),  Anemia, PAD and  Remote Tobacco Abuse.     STS Risks  3.006% Risk of Mortality  25.732% Risk of  Morbidity or Mortality                 CBC - WBC: 6.32;  HB: 8.1; Hct: 27.7;  PLT: 246;  BMP - NA: 136;     K: 4.6;    BUN:  24.0;  Creatinine: 1.5;  Glucose: 160  Alk Phos: 64;  ALT:  13;    AST:  81  TP: #1 0.15; #2  21.49; #3: 16.96;  #4: 8.55  UA - positive Large  Leukocytes  Blood Cultures of  02/28/14 - negative;  no growth x  1 day (2 sets)  MRSA Screening -  negative     Cardiac Cath 03/01/14  - Dr. Glean Hess  1. Distal left main,  60 % stenosis,  ostial. 2. Ostial  circumflex, 80  % stenosis, ulcerated.  3. 3rd OM  with ostial lesion.  4 100 % mid RCA  with left to right  collaterals; Chronically  occluded  right coronary  artery stent (per  Angio 06/2013).     Echocardiogram 03/01/14  LEFT VENTRICLE:  Size was normal.  Ejection fraction  was estimated to  be 45 %. There was  mild diffuse hypokinesis.  Wall  thickness was increased.  Doppler:  There was an increased  relative  contribution of  atrial contraction  to ventricular  filling.  AORTIC VALVE: The  valve was trileaflet.  Leaflets exhibited  moderately increased  thickness, moderate  calcification,  and  moderately reduced  cuspal separation.  Doppler: There  was moderate  stenosis. There  was no regurgitation.  AORTA: The root  exhibited normal  size.  MITRAL VALVE: There  was mild annular  calcification.  There was  minimal thickening.  There was normal  leaflet separation.  Doppler: There was  trivial regurgitation.  LEFT ATRIUM: The  atrium was dilated.  ATRIAL SEPTUM: No  defect or patent  foramen ovale was  identified.  RIGHT VENTRICLE:  The size was normal.  Systolic function  was normal.  Wall thickness was  normal. Doppler:  The tricuspid  jet envelope definition  was inadequate  for estimation  of RV systolic  pressure. There  are no indirect findings  (abnormal  RV volume or geometry,  altered pulmonary  flow velocity  profile, or  leftward septal  displacement) which  would suggest  moderate or severe  pulmonary hypertension.  PULMONIC VALVE:  Not well visualized.  PULMONARY ARTERY:  Not well visualized.  TRICUSPID VALVE:  The valve structure  was normal. There  was normal  leaflet separation.  Doppler: The  transtricuspid  velocity was  within  the normal range.  There was no  evidence for  tricuspid stenosis.  There was no  regurgitation.  RIGHT ATRIUM: Size  was normal.  SYSTEMIC VEINS:  IVC: The inferior  vena cava was normal  in size and  course. Respirophasic  changes were  normal.  PERICARDIUM: There  was no pericardial  effusion. The  pericardium was  normal in appearance.  COMPARISONS:  No prior studies  were available for  comparison.  Prepared and signed  by  Lanette Hampshire, MD  Signed 01-Mar-2014  13:07:32     Bilateral Carotid  Doppler 03/01/14  IMPRESSION:  Diffuse intimal thickening,  soft plaque  and calcific  plaque  in the extracranial  carotid arteries  with less than  50% luminal  narrowing.  Geanie Cooley,  MD  03/01/2014 4:04 PM     Bilateral Lower Extremity  Vein Mapping  03/01/14  FINDINGS:  Right lower extremity  superficial  veins: The great  saphenous vein is  widely patent throughout,  measuring  between 2.5 and  4 mm in the calf  and  in the thigh, between  3.9 and 4.4  mm. The small saphenous  vein is  widely  patent, measuring  between 2.2 and  4.4 mm in the calf.  Note that  there is  some mild wall thickening  in the small  saphenous vein.  Left lower extremity  superficial veins:  The great  saphenous  vein is  widely patent throughout,  measuring  between 0.7 and  1.3 mm in the  calf  and in the thigh,  between 1.2 and  3.0 mm. The small  saphenous vein  is  widely patent in  the mid to proximal  calf, measuring  between 1.1 and  1.8  mm. Note that there  is some mild wall  thickening in  the small  saphenous  vein.  IMPRESSION:  1. Right and left  great saphenous  veins widely patent  throughout as  detailed above.  2. Small saphenous  veins are widely  patent bilaterally  as detailed  above.  3. Note that there  is mild wall thickening  in both  small saphenous  veins.  Gwenyth Allegra,  MD  03/01/2014 4:07 PM     Bedside Spirometry  03/02/14  FEV1 1.1 Liters;  65% predicted     Bilateral Lower Extremity   Arterial  Doppler Study pending  Description of Early  Procedure  The patient was  brought to the operating  room for  urgent surgery and  placed in the supine  position.  General  anesthesia  was induced.  All  monitoring lines  were placed percutaneously.   The  neck, chest,  abdomen and lower  extremities were  prepped and draped  in the usual  sterile fashion.   The operative approach  in this case  was that of  full-median sternotomy  and cardiopulmonary  bypass.   The pericardium  was opened and suspended.     Operative Technique  Using epiaortic  ultrasound, the aorta  was determined  to contain  scatter atheromas  and large pos plaques  precluding  use of aortic  cross clamp..  The  following arterial  and venous conduits  were  obtained:     Bypass Graft Obtained   Quality      Harvest  Method  left leg                good         endo  left mammary artery     good         pedicled     All arterial small  branches were  controlled with metal  clips.  Small  branches on the  vein graft were controlled  with ligatures  and clips.  The patient was  then fully heparinized,  and the conduits  were  prepared for grafting.   Arterial  cannulation was accomplished.  Venous cannulation  was accomplished  in the right atrium  using simple  two stage cannulation  cannula.  The  patient was connected  to the  heart/lung machine.  The rest of the  operation was  now performed with  beating heart on  bypass Coronary  bypass grafting was  performed as  noted below. Foot  stabilisers and  urchin were used.  Silastic tapes  were used. Prox  anastomosis was done  with HeartString  device.                                  Distal   Distal      Proximal                   Artery        AnastomosAnastomosisAnastomosis  Seq                                  is  SeqVessel    mm  QualityConduiSite       ConfiguratiConfiguration   Graf                           t               on                         t                            In     Mid LAD     Proximal               1.75good    Situ            End to  side     LAD                           LIMA                                  Obtuse     Circumfle             Vein               1.105fair           Marginal End to  side     x                     graft                                  1        Closing  Phase  Pacing  wires were placed  in the atrium  and the ventricle.   After  adequate  rewarming, the  patient was then  weaned from cardiopulmonary  bypass.   The heparin was  reversed with  protamine.  Vessel  patency  was assessed  by intraoperative  doppler  and found to be satisfactory.  Cardiac  rhythm post-bypass  was normal sinus  rhythm.  The  patient was  decannulated,  and following  meticulous  hemostasis, the sternum  was  approximated  with interrupted  #5 stainless  steel wire while  the  linea  alba and fascia were  closed with Vicryl.   Hemodynamic  stability  was achieved  and the chest was  closed in layers  after  leaving  mediastinal, right,  and left pleural  tubes in place.   Sponge  and needle  counts were  correct and the  patient was transferred  to  CVICU  with stable vital  signs.  A message  was conveyed to  the family  that the  operation was  completed.              Austin Miles, M.D.   electronically  signed  on 03/04/2014  6:45:24 PM  with status  of Final

## 2014-03-04 NOTE — Progress Notes (Addendum)
CCU ATTENDING DAILY PROGRESS NOTE     Date Time:   03/04/2014    10:41 AM  Patient Name: [name]    LOS: 4 days     Subjective/24 Hour Events:  NPO for CABG today      Scheduled Meds:     albuterol-ipratropium 3 mL Nebulization Q6H SCH   amiodarone 200 mg Oral Q12H   aspirin 81 mg Oral Once   atorvastatin 20 mg Oral QHS   carvedilol 25 mg Oral Q12H SCH   darbepoetin alfa (ARANESP) injection 100 mcg Subcutaneous Weekly   gabapentin 600 mg Oral Q8H SCH   hydrALAZINE 50 mg Oral Q6H   iron sucrose 200 mg Intravenous Q24H SCH   polyethylene glycol 17 g Oral Daily   Senna 8.6 mg Oral BID   terazosin 1 mg Oral QHS   venlafaxine 75 mg Oral Q24H   vitamin C 500 mg Oral Daily     Continuous Infusions:  . heparin 11914 units in dextrose 5% 500 mL 950 Units/hr (03/04/14 0700)   . custom IV infusion builder 50 mL/hr at 03/04/14 0700     PRN Meds:dextrose, glucagon (rDNA), heparin (porcine), insulin aspart, labetalol, LORazepam, oxyCODONE-acetaminophen    Objective:  Physical Exam:  BP 157/67 mmHg  Pulse 76  Temp(Src) 98.1 F (36.7 C) (Oral)  Resp 17  Ht 1.562 m (5' 1.5")  Wt 72.757 kg (160 lb 6.4 oz)  BMI 29.82 kg/m2  SpO2 96%     Intake/Output Summary (Last 24 hours) at 03/04/14 1041  Last data filed at 03/04/14 0700   Gross per 24 hour   Intake 2598.04 ml   Output   3100 ml   Net -501.96 ml     General: AO3, elderly woman, pleasant  Cardiovascular:  RRR, soft 2/6 systolic murmur  Lungs:    Clear to auscultation bilaterally, without wheezing, rhonchi, or rales  Abdomen: soft, non-tender, non-distended, no hepatosplenomegaly, normoactive bowel sounds  Extremities:  Warm, RLE in bandage  Lab Review:   Recent Labs      03/04/14   0336  03/03/14   0118  03/02/14   0120   WBC  7.21  6.58  6.32   HEMOGLOBIN  7.8*  8.1*  8.1*   HEMATOCRIT  26.7*  27.5*  27.7*   PLATELETS  243  242  246     Recent Labs      03/01/14   1124   TROPONIN I  8.55*    Recent Labs      03/04/14   0336  03/03/14   0118  03/02/14   0120   SODIUM   137  136  136   POTASSIUM  4.4  4.6  4.6   CO2  28  23  22    BUN  26.0*  23.0*  24.0*   CREATININE  1.7*  1.4*  1.5*   GLUCOSE  122*  129*  160*   MAGNESIUM  2.1  2.3  2.1     Recent Labs      03/03/14   1622   PT INR  1.0          Imaging:  Radiology Results (24 Hour)     Procedure Component Value Units Date/Time    [place] Noninvas Low Extrem Art Dopp/Press/Wavefrms (PVR) Comp 3-4 Levels [[id number]] Collected:  03/03/14 1610    Order Status:  Completed Updated:  03/04/14 0926    Narrative:      CLINICAL HISTORY:  Chronic  leg ulcers. History of recent right lower  extremity angioplasty and stent placement.    Noninvasive arterial studies of the lower extremities were performed  using segmental limb pressures, volume plethysmography. Pulse volume  recordings were obtained from the proximal thighs to the metatarsal  level, and Doppler waveforms at both posterior tibials, and dorsalis  pedis arteries.    High resolution gray scale imaging, color Doppler and spectral waveform  analysis were used to evaluate the iliac inflow and bilateral extremity  arteries from the common femoral arteries to the level of the runoff  vessels.      The resting ankle-brachial indices are 1.0 right and 0.52 left.    Toe brachial indices measure 0.61 right and 0.37 left.    Great toe waveform on the right is unremarkable, on the left this is  moderately degraded.    Right Lower Extremity:       Segmental pressures were not obtained with normal ankle-brachial  indices.       Pulse volume recordings are mildly degraded throughout the right  lower extremity.        Duplex imaging shows velocity elevation in the distal external  iliac of 316 cm/s most likely due to stenosis in the 50-75% range. The  common iliac could not be evaluated due to bowel gas. Significant  plaquing is noted in the external iliac and common femoral. The  superficial femoral artery stent is patent. Just distal to the stent,  velocity elevation of 149 cm/s is noted which just  meets duplex criteria  for stenosis in the 50-75% range, this may be closer to 50%. The  popliteal and single peroneal runoff is noted. The posterior tibial is  occluded throughout its length. The anterior tibial is occluded but  reconstitutes distally at the ankle    Left Lower Extremity:       Segmental pressures show a pressure gradient across the femoral  popliteal segment and at the calf level.       Pulse volume recordings are moderately degraded from the thigh to  the proximal calf and severely degraded at the ankle and metatarsal  level.         Duplex imaging shows patent common and external iliac. Extensive  plaquing is noted throughout the left lower extremity. 50-75% stenosis  is noted at the origin of the profunda femoral multiple less than 50%  stenoses noted in the proximal superficial femoral, greater than 75%  stenosis mid left superficial femoral artery and distal superficial  femoral artery. The popliteal is patent with poststenotic monophasic  waveforms. Greater than 75% stenosis is noted in the tibioperoneal  trunk. The anterior tibial is patent. The posterior tibial is occluded  proximally, reconstitutes in its midportion, and is then occluded  distally. The peroneal is patent but with 50-75% stenosis in its  midportion.      Impression:         1. Right lower extremity: Normal ankle-brachial index 1.0 and toe  brachial index 0.61. Patent right superficial femoral artery stent.  Suspected 50-75% stenosis right external iliac and superficial femoral  artery just distal to the stent.  2. Single peroneal runoff on the right.  3. Left lower extremity: Reduced ankle-brachial index 0.52 and toe  brachial index 0.37.  Diffuse atherosclerotic disease is noted with  multiple stenoses diffusely within the superficial femoral artery and  significant stenosis in the tibioperoneal trunk.   4. 2 vessel runoff with patent left anterior tibial and peroneal but  with 50-75% stenosis mid peroneal.    Carmina Miller, MD   03/03/2014 4:19 PM      US Arterial / Graft Duplex Doppler Lower Extremity Bilateral Complete [[id number]] Collected:  03/03/14 1610    Order Status:  Completed Updated:  03/03/14 1623    Narrative:      CLINICAL HISTORY:  Chronic leg ulcers. History of recent right lower  extremity angioplasty and stent placement.    Noninvasive arterial studies of the lower extremities were performed  using segmental limb pressures, volume plethysmography. Pulse volume  recordings were obtained from the proximal thighs to the metatarsal  level, and Doppler waveforms at both posterior tibials, and dorsalis  pedis arteries.    High resolution gray scale imaging, color Doppler and spectral waveform  analysis were used to evaluate the iliac inflow and bilateral extremity  arteries from the common femoral arteries to the level of the runoff  vessels.      The resting ankle-brachial indices are 1.0 right and 0.52 left.    Toe brachial indices measure 0.61 right and 0.37 left.    Great toe waveform on the right is unremarkable, on the left this is  moderately degraded.    Right Lower Extremity:       Segmental pressures were not obtained with normal ankle-brachial  indices.       Pulse volume recordings are mildly degraded throughout the right  lower extremity.        Duplex imaging shows velocity elevation in the distal external  iliac of 316 cm/s most likely due to stenosis in the 50-75% range. The  common iliac could not be evaluated due to bowel gas. Significant  plaquing is noted in the external iliac and common femoral. The  superficial femoral artery stent is patent. Just distal to the stent,  velocity elevation of 149 cm/s is noted which just meets duplex criteria  for stenosis in the 50-75% range, this may be closer to 50%. The  popliteal and single peroneal runoff is noted. The posterior tibial is  occluded throughout its length. The anterior tibial is occluded but  reconstitutes distally at the ankle    Left Lower  Extremity:       Segmental pressures show a pressure gradient across the femoral  popliteal segment and at the calf level.       Pulse volume recordings are moderately degraded from the thigh to  the proximal calf and severely degraded at the ankle and metatarsal  level.         Duplex imaging shows patent common and external iliac. Extensive  plaquing is noted throughout the left lower extremity. 50-75% stenosis  is noted at the origin of the profunda femoral multiple less than 50%  stenoses noted in the proximal superficial femoral, greater than 75%  stenosis mid left superficial femoral artery and distal superficial  femoral artery. The popliteal is patent with poststenotic monophasic  waveforms. Greater than 75% stenosis is noted in the tibioperoneal  trunk. The anterior tibial is patent. The posterior tibial is occluded  proximally, reconstitutes in its midportion, and is then occluded  distally. The peroneal is patent but with 50-75% stenosis in its  midportion.      Impression:         1. Right lower extremity: Normal ankle-brachial index 1.0 and toe  brachial index 0.61. Patent right superficial femoral artery stent.  Suspected 50-75% stenosis right external iliac and superficial femoral  artery just distal to the  stent.  2. Single peroneal runoff on the right.  3. Left lower extremity: Reduced ankle-brachial index 0.52 and toe  brachial index 0.37.  Diffuse atherosclerotic disease is noted with  multiple stenoses diffusely within the superficial femoral artery and  significant stenosis in the tibioperoneal trunk.   4. 2 vessel runoff with patent left anterior tibial and peroneal but  with 50-75% stenosis mid peroneal.    Carmina Miller, MD   03/03/2014 4:19 PM      XR CHEST 2 VIEWS [[id number]] Collected:  03/03/14 1606    Order Status:  Completed Updated:  03/03/14 1612    Narrative:      HISTORY: Preoperative examination prior to cardiac surgery.    COMPARISON: 02/28/2014.    TECHNIQUE: AP and lateral  projections of the chest.    FINDINGS:    Coronary stent is noted. Cardiac size and mediastinal contours are  normal. Pulmonary vascularity is normal, and the lungs are clear. There  is no pneumothorax or pleural effusion. No acute osseous abnormality is  identified.      Impression:        1. Clear lungs.  2. Normal heart size.    Elizebeth Koller, MD   03/03/2014 4:08 PM            Assessment:    1. CAD multivessel CAD with severe LM, mildly depressed LVEF 45%  2. Moderate Aortic Stenosis, AVA 1.2 cm2  3. Chronic RLE wound, no abx, no evidence of osteo  4. CKD, CrCl improved  5. COPD  6. DM2  7. HTN  8. Anemia of Chronic Disease  9. Asymptomatic proteus colononization. ID did not recommend treatment    Plan:    1. CABG today          Signed by: Precious Gilding, MD  Spectralink [phone]

## 2014-03-04 NOTE — Anesthesia Preprocedure Evaluation (Signed)
Anesthesia Evaluation    AIRWAY    Mallampati: II    TM distance: >3 FB  Neck ROM: full  Mouth Opening:full   CARDIOVASCULAR    regular, normal and murmur  Systolic Grade: 2/6     DENTAL           PULMONARY    pulmonary exam normal     OTHER FINDINGS                      Anesthesia Plan    ASA 4     general                     intravenous induction   Detailed anesthesia plan: general endotracheal  Monitors/Adjuncts: arterial line, CVP, BIS, PA cath, cerebral oximetry and TEE    Post Op: ICU      informed consent obtained    Plan discussed with CRNA.

## 2014-03-04 NOTE — Progress Notes (Signed)
ICU/CCU Daily Progress Note    Patient's Name: [name]    Room:  FI112/FI112-01  Attending Provider: Phylliss Blakes, MD  Admit Date:02/28/2014  Medical Record Number: [id number]     Date/Time: 03/04/2014 5:39 AM    63 year old female with PMH of CAD, left circumflex stent in jan 2015 and RCA stent, Diabetes, PAD with chronic right lower leg ulcer, and mild COPD, presented to [facility] with Syncope and NSTEMI, had Heart Cath which showed Triple Vessel Disease, transferred to [facility] for probable CABG in setting of active foot ulcer , had likely Type 4A NSTEMI due to PCI, troponins trending down.  Pulm and ID on board     24hr events: none  Taken to surgery this morning, will be transferred to CV-ICU post CABG  No issues overnight     Patient Active Problem List   Diagnosis   . NSTEMI (non-ST elevated myocardial infarction)   . Hypertensive urgency   . PAD (peripheral artery disease)   . Arterial leg ulcer   . Chronic obstructive pulmonary disease, unspecified COPD type   . Ischemic cardiomyopathy          VITAL SIGNS PHYSICAL EXAM   Temp:  [97.8 F (36.6 C)-98.9 F (37.2 C)] 98.1 F (36.7 C)  Heart Rate:  [66-95] 71  Resp Rate:  [12-31] 16  BP: (134-183)/(60-111) 162/72 mmHg  Blood Glucose:  Pulse ox:  Telemetry:       Intake/Output Summary (Last 24 hours) at 03/04/14 0558  Last data filed at 03/04/14 0400   Gross per 24 hour   Intake 3096.67 ml   Output   4000 ml   Net -903.33 ml    Physical Exam  Cons : aa03 NAD  Neuro: No focal deficits  HEENT: MMM, no sores, unchanged   Cardiac: systolic murmur at right upper sternal border unchanged , normal s1-s2, no rubs or gallops  Lungs: wheezes at bases , CTA bilaterally, improved since yesterday  Abdomen: non tender, non distended  MWN:UUVOZ bandaged, chronic ischemic changes present in feet bilaterally   Skin: venous stasis changes bilaterally legs        Scheduled Meds: PRN Meds:        albuterol-ipratropium 3 mL Nebulization Q6H SCH   amiodarone 200 mg Oral Q12H    aspirin 81 mg Oral Once   atorvastatin 20 mg Oral QHS   carvedilol 25 mg Oral Q12H SCH   chlorhexidine 15 mL Mouth/Throat Once   darbepoetin alfa (ARANESP) injection 100 mcg Subcutaneous Weekly   gabapentin 600 mg Oral Q8H SCH   hydrALAZINE 50 mg Oral Q6H   iron sucrose 200 mg Intravenous Q24H SCH   mupirocin  Nasal BID   polyethylene glycol 17 g Oral Daily   Senna 8.6 mg Oral BID   terazosin 1 mg Oral QHS   venlafaxine 75 mg Oral Q24H   vitamin C 500 mg Oral Daily       Continuous Infusions:  . heparin 36644 units in dextrose 5% 500 mL 950 Units/hr (03/04/14 0438)   . custom IV infusion builder 50 mL/hr at 03/04/14 0400      dextrose 25 mL PRN   glucagon (rDNA) 1 mg PRN   heparin (porcine) 3,000 Units PRN   insulin aspart 1-5 Units PRN   labetalol 10 mg Q1H PRN   LORazepam 0.5 mg Q8H PRN   oxyCODONE-acetaminophen 1 tablet Q4H PRN           Labs (last 72 hours):  Recent Labs      03/04/14   0336  03/03/14   0118  03/02/14   0120   WBC  7.21  6.58  6.32   HEMOGLOBIN  7.8*  8.1*  8.1*   HEMATOCRIT  26.7*  27.5*  27.7*     Recent Labs      03/04/14   0336  03/03/14   1622  03/03/14   0825   PT   --   13.6   --    PT INR   --   1.0   --    PTT  53*  56*  53*    Recent Labs      03/03/14   0118  03/02/14   0120   SODIUM  136  136   POTASSIUM  4.6  4.6   CHLORIDE  106  107   CO2  23  22   BUN  23.0*  24.0*   CREATININE  1.4*  1.5*   GLUCOSE  129*  160*   CALCIUM  8.2*  8.1*   MAGNESIUM  2.3  2.1   PHOSPHORUS  3.5  3.2                 Microbiology:   Microbiology Results     Procedure Component Value Units Date/Time    CULTURE BLOOD AEROBIC AND ANAEROBIC [[id number]] Collected:  02/28/14 2344    Specimen Information:  Blood, Venipuncture Updated:  03/03/14 0221    Narrative:      ORDER#: [id number]                                    ORDERED BY: Jacolyn Reedy  SOURCE: Blood, Venipuncture peripheral               COLLECTED:  02/28/14 23:44  ANTIBIOTICS AT COLL.:                                RECEIVED :  03/01/14  01:49  Culture Blood Aerobic and Anaerobic        PRELIM      03/03/14 02:21  03/02/14   No Growth after 1 day/s of incubation.  03/03/14   No Growth after 2 day/s of incubation.      CULTURE BLOOD AEROBIC AND ANAEROBIC [[id number]] Collected:  02/28/14 2345    Specimen Information:  Blood, Venipuncture Updated:  03/03/14 0221    Narrative:      ORDER#: [id number]                                    ORDERED BY: Jacolyn Reedy  SOURCE: Blood, Venipuncture peripheral               COLLECTED:  02/28/14 23:45  ANTIBIOTICS AT COLL.:                                RECEIVED :  03/01/14 00:22  Culture Blood Aerobic and Anaerobic        PRELIM      03/03/14 02:21  03/02/14   No Growth after 1 day/s of incubation.  03/03/14   No Growth after 2 day/s of incubation.      MRSA culture [  [id number]] Collected:  02/28/14 1657    Specimen Information:  Body Fluid / Nasal/Throat ASC Admission Updated:  03/01/14 2037    Narrative:      ORDER#: [id number]                                    ORDERED BY: Junie Bame  SOURCE: Nares and Throat                             COLLECTED:  02/28/14 16:57  ANTIBIOTICS AT COLL.:                                RECEIVED :  02/28/14 19:44  Culture MRSA Surveillance                  FINAL       03/01/14 20:37  03/01/14   Negative for Methicillin Resistant Staph aureus from Nares and             Negative for Methicillin Resistant Staph aureus from Throat            Imaging:  Xr Chest 2 Views    03/03/2014    1. Clear lungs. 2. Normal heart size.  Elizebeth Koller, MD  03/03/2014 4:08 PM     Xr Tibia Fibula Right Ap And Lateral    03/01/2014    1. No findings of osteomyelitis involving the shafts of the tibia or fibula. 2. 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 Further evaluation of these regions should be guided by clinical parameters.  Annabell Sabal, MD  03/01/2014 11:12 AM     Xr Chest Ap  Portable    02/28/2014    No acute disease.  Kennyth Lose, MD  02/28/2014 9:38 PM     US Carotid Duplex Doppler Complete    03/01/2014    Diffuse intimal thickening, soft plaque and calcific plaque in the extracranial carotid arteries with less than 50% luminal narrowing.  Geanie Cooley, MD  03/01/2014 4:04 PM     US Arterial / Graft Duplex Doppler Lower Extremity Bilateral Complete    03/03/2014     1. Right lower extremity: Normal ankle-brachial index 1.0 and toe brachial index 0.61. Patent right superficial femoral artery stent. Suspected 50-75% stenosis right external iliac and superficial femoral artery just distal to the stent. 2. Single peroneal runoff on the right. 3. Left lower extremity: Reduced ankle-brachial index 0.52 and toe brachial index 0.37.  Diffuse atherosclerotic disease is noted with multiple stenoses diffusely within the superficial femoral artery and significant stenosis in the tibioperoneal trunk.  4. 2 vessel runoff with patent left anterior tibial and peroneal but with 50-75% stenosis mid peroneal.  Carmina Miller, MD  03/03/2014 4:19 PM     US Venous Duplex Doppler Leg Bilateral Limited    03/01/2014      1. Right and left great saphenous veins widely patent throughout as detailed above. 2. Small saphenous veins are widely patent bilaterally as detailed above.  3. Note that there is mild wall thickening in both small saphenous veins.  Gwenyth Allegra, MD  03/01/2014 4:07 PM         Assessment and Plan:    #  NSTEMI VS Type 4A MI:   -No EKG changes, no chest pain. Isolated troponin bump from 0.15 to 21 following angiography 12/14  -Coreg 25 BID   -  continue aspirin,    -continue low intensity heparin dri   - continue atorvastatin 80,   - hold ACE inhibitor with recovering AKI hold  - hold clopidogrel with possible coronary artery bypass graft this week.   -Nitro drip discontinued due to headache   - US Venous Doppler negative, US Carotids show less than 50% narrowing    - Arterial  study today     #AKI 2/2 Ace Inhibitor/Prerenal    - holding Ace Inhibitor/lasix     # Multivessel CAD: 1. Distal left main, 60 % stenosis, ostial. 2. Ostial circumflex, 80 % stenosis, ulcerated. 3. 3rd OM with ostial lesion. 4 100 % mid RCA with left to right collaterals; Chronically occluded right coronary artery stent (per Angio 06/2013).   --cardiothoracic surgery is planning for surgery Friday     #PAD w/ chronic -medial right lower extremity arterial ulcer-status post angioplasty in April 2015.   - Wound culture from 12/13 at [facility] indicates many mixed flora with no predominating organism.   -Wound Care Nurse Consulted   -ID consulted to make recs regarding CABG, feel no reason to delay surgery as no infection, suggest Vanc and Cefipime in perioperative phase     #asymptomatic proteus colonization of urine   - per ID no need to recheck urine giving lack of symptoms   -greater than 100,000 colony-forming units GNR at [facility].patient    - no symptoms    #hypertension:    - BP between 140-150 systolic   - hydralazine 50mg /q8 will consider increasing today to obtain better blood pressure control  -Coreg 25 BID    #type 2 diabetes: Insulin-dependent. H A1c = 7.1 on 12/12.       - sliding scale per home regimen       - no need for basal insulin at this time     #COPD:    -PFTs done, meets criteria for acceptable risk for surgery, per pulmopnary there recommednation is prior to surgery to discontinue Spiriva and Symbicort one day before surgery, start Budenoside and duonebs   - CXR 12/14 showed no acute findings   . Resumed Spiriva and Symbicort equivalent while in hospital    Q 6 Duonebs at present.         # Anemia:        - Iron Profile shows low iron and low iron sat      - started venofer and aranesp      - due to chronic disease and iron deficiency     #anxiety    -restarted on Venlafaxine per her home dose    #FEN: Cardiac diet, gentle IV fluid hydration       Prophylaxis:  - DVT:  Heparin   - GI:  None      Code Status: FULL    Dispo: CABG     Lines/Drains/Airways:      Safety Checklist:     DVT prophylaxis:  CHEST guideline (See page [id number]) Chemical and Mechanical   Foley: Not present   IVs:  Peripheral IV   PT/OT: Ordered   Daily CBC & or Chem ordered:  SHM/ABIM guidelines (see #5) Yes, due to clinical and lab instability   Reference for charges of common labs: CBC auto diff - $76  BMP - $99  Mg - $  79        Signed by: Claud Kelp, DO  Date/Time: 03/04/2014 5:58 AM

## 2014-03-04 NOTE — Brief Op Note (Signed)
BRIEF OP NOTE    Date Time: 03/04/2014 6:33 PM    Patient Name:   [name]    Date of Operation:   03/04/2014    Providers Performing:   Surgeon(s):  Austin Miles, MD  Ad, Alvira Philips, MD  Massimiano, Ollen Gross, MD  Inda Coke, [place]    Assistant (s):   Circulator: de Orene Desanctis, Jill Side, RN  Perfusionist: Percell Locus, CCP  Relief Circulator: Janey Greaser, RN  Relief Scrub: Campbell Riches  Scrub Person: Vellucci, Dorena Cookey, RN  First Assistant: Raymond Gurney, RN  Second Assistant: Janey Greaser, RN; Trilby Drummer, RN  CV Tech: Verner Chol  Second Scrub Person: Omelia Blackwater, RN    Operative Procedure:   Procedure(s):  Urgent CORONARY ARTERY BYPASS x 2 (LIMa-LAD, SVG-OM1) beating heart/on pump  Epiaortic [place]  ENDOSCOPIC,VEIN HARVEST LLE  TEE    Preoperative Diagnosis:   Pre-Op Diagnosis Codes:  NSTEMI  CRI  LVH    Postoperative Diagnosis:   Post-Op Diagnosis Codes:   same    Anesthesia:   General    Estimated Blood Loss:   50ml    Implants:   * No implants in log *    Drains:   Drains: 4 x 24Fr Blakes. Rt in Rt chest, Lt in Lt chest and other 2 mediastinal    Specimens:   Nil    Findings:   AV gradient  PDA too small for bypass  Severe dense calcification distal aorta. Severe pos calcification in rest of asc aorta precluding aortic cross clamp.  Targets - 1.104mm with disease  Conduits-good  Good doppler signals    Complications:   Nil      Signed by: Austin Miles, MD                                                                           [facility]

## 2014-03-04 NOTE — Plan of Care (Signed)
Patient A&Ox4, afebrile, complaints of headache, pain relieved with Percocet. SR on tele, BP 130's-170's, PRN Labetolol ordered but not admin. Desats noted during sleep on tele (to 80's), O2 sats maintained >98% on 2L NC. Pt NPO after MN for sugery tomorrow. CHG bath admin this AM. Heparin gtt and bicarb gtt continued per orders, see flowsheets for titrations. Patient remains safe and free from falls. Will continue to monitor.    Plan of care:   Scheduled for CABG 12/18 at 1300

## 2014-03-04 NOTE — OR Nursing (Signed)
Report called to CVICU RN, David.

## 2014-03-04 NOTE — OR Nursing (Signed)
Update given to family via phone.

## 2014-03-04 NOTE — Transfer of Care (Signed)
Anesthesia Transfer of Care Note    Patient: [name]    Procedures performed: Procedure(s) with comments:  CORONARY ARTERY BYPASS - LIMA to LAD  SVG to OM  ENDOSCOPIC,VEIN HARVEST - By D. Lazarus Gowda, RNFA. Left groin to mid-calf.  TEE - probe [id number]    Anesthesia type: General ETT    Patient location:PACU    Last vitals:   Filed Vitals:    03/04/14 1854   BP: 106/56   Pulse: 80   Temp:    Resp: 10   SpO2: 100%       Post pain: Patient not complaining of pain, continue current therapy      Mental Status:sedated    Respiratory Function: intubated    Cardiovascular: stable    Nausea/Vomiting: patient not complaining of nausea or vomiting    Hydration Status: adequate    Post assessment: no apparent anesthetic complications, no reportable events and no evidence of recall

## 2014-03-05 ENCOUNTER — Inpatient Hospital Stay: Payer: BLUE CROSS/BLUE SHIELD

## 2014-03-05 DIAGNOSIS — E119 Type 2 diabetes mellitus without complications: Secondary | ICD-10-CM | POA: Diagnosis present

## 2014-03-05 DIAGNOSIS — N189 Chronic kidney disease, unspecified: Secondary | ICD-10-CM | POA: Diagnosis present

## 2014-03-05 DIAGNOSIS — I2510 Atherosclerotic heart disease of native coronary artery without angina pectoris: Secondary | ICD-10-CM | POA: Diagnosis present

## 2014-03-05 LAB — GLUCOSE WHOLE BLOOD - POCT
Whole Blood Glucose POCT: 102 mg/dL — ABNORMAL HIGH (ref 70–100)
Whole Blood Glucose POCT: 104 mg/dL — ABNORMAL HIGH (ref 70–100)
Whole Blood Glucose POCT: 109 mg/dL — ABNORMAL HIGH (ref 70–100)
Whole Blood Glucose POCT: 110 mg/dL — ABNORMAL HIGH (ref 70–100)
Whole Blood Glucose POCT: 112 mg/dL — ABNORMAL HIGH (ref 70–100)
Whole Blood Glucose POCT: 135 mg/dL — ABNORMAL HIGH (ref 70–100)
Whole Blood Glucose POCT: 139 mg/dL — ABNORMAL HIGH (ref 70–100)
Whole Blood Glucose POCT: 143 mg/dL — ABNORMAL HIGH (ref 70–100)
Whole Blood Glucose POCT: 143 mg/dL — ABNORMAL HIGH (ref 70–100)
Whole Blood Glucose POCT: 150 mg/dL — ABNORMAL HIGH (ref 70–100)
Whole Blood Glucose POCT: 159 mg/dL — ABNORMAL HIGH (ref 70–100)
Whole Blood Glucose POCT: 176 mg/dL — ABNORMAL HIGH (ref 70–100)
Whole Blood Glucose POCT: 176 mg/dL — ABNORMAL HIGH (ref 70–100)
Whole Blood Glucose POCT: 234 mg/dL — ABNORMAL HIGH (ref 70–100)
Whole Blood Glucose POCT: 94 mg/dL (ref 70–100)

## 2014-03-05 LAB — HEPATIC FUNCTION PANEL
ALT: 14 U/L (ref 0–55)
AST (SGOT): 30 U/L (ref 5–34)
Albumin/Globulin Ratio: 1 (ref 0.9–2.2)
Albumin: 3 g/dL — ABNORMAL LOW (ref 3.5–5.0)
Alkaline Phosphatase: 47 U/L (ref 37–106)
Bilirubin Direct: 0.2 mg/dL (ref 0.0–0.5)
Bilirubin Indirect: 0.1 mg/dL (ref 0.0–1.1)
Bilirubin, Total: 0.3 mg/dL (ref 0.2–1.2)
Globulin: 3 g/dL (ref 2.0–3.6)
Protein, Total: 6 g/dL (ref 6.0–8.3)

## 2014-03-05 LAB — CBC
Hematocrit: 28.4 % — ABNORMAL LOW (ref 37.0–47.0)
Hgb: 8.6 g/dL — ABNORMAL LOW (ref 12.0–16.0)
MCH: 25.8 pg — ABNORMAL LOW (ref 28.0–32.0)
MCHC: 30.3 g/dL — ABNORMAL LOW (ref 32.0–36.0)
MCV: 85.3 fL (ref 80.0–100.0)
MPV: 9.5 fL (ref 9.4–12.3)
Nucleated RBC: 1 /100 WBC (ref 0–1)
Platelets: 176 10*3/uL (ref 140–400)
RBC: 3.33 10*6/uL — ABNORMAL LOW (ref 4.20–5.40)
RDW: 18 % — ABNORMAL HIGH (ref 12–15)
WBC: 11.4 10*3/uL — ABNORMAL HIGH (ref 3.50–10.80)

## 2014-03-05 LAB — GFR
EGFR: 34.2
EGFR: 30.3

## 2014-03-05 LAB — BASIC METABOLIC PANEL
BUN: 25 mg/dL — ABNORMAL HIGH (ref 7.0–19.0)
BUN: 28 mg/dL — ABNORMAL HIGH (ref 7.0–19.0)
CO2: 23 mEq/L (ref 22–29)
CO2: 22 mEq/L (ref 22–29)
Calcium: 8.5 mg/dL (ref 8.5–10.5)
Calcium: 9.1 mg/dL (ref 8.5–10.5)
Chloride: 108 mEq/L (ref 100–111)
Chloride: 108 mEq/L (ref 100–111)
Creatinine: 1.8 mg/dL — ABNORMAL HIGH (ref 0.6–1.0)
Creatinine: 2 mg/dL — ABNORMAL HIGH (ref 0.6–1.0)
Glucose: 112 mg/dL — ABNORMAL HIGH (ref 70–100)
Glucose: 148 mg/dL — ABNORMAL HIGH (ref 70–100)
Potassium: 4.2 mEq/L (ref 3.5–5.1)
Potassium: 5 mEq/L (ref 3.5–5.1)
Sodium: 137 mEq/L (ref 136–145)
Sodium: 138 mEq/L (ref 136–145)

## 2014-03-05 LAB — MAGNESIUM: Magnesium: 2.7 mg/dL — ABNORMAL HIGH (ref 1.6–2.6)

## 2014-03-05 MED ORDER — DEXTROSE-SODIUM CHLORIDE 5-0.9 % IV SOLN
INTRAVENOUS | Status: DC
Start: 2014-03-05 — End: 2014-03-05

## 2014-03-05 MED ORDER — ALBUMIN HUMAN 5 % IV SOLN
25.00 g | Freq: Once | INTRAVENOUS | Status: AC
Start: 2014-03-05 — End: 2014-03-05
  Administered 2014-03-05: 25 g via INTRAVENOUS
  Filled 2014-03-05: qty 500

## 2014-03-05 MED ORDER — CARVEDILOL 6.25 MG PO TABS
6.25 mg | ORAL_TABLET | Freq: Two times a day (BID) | ORAL | Status: DC
Start: 2014-03-05 — End: 2014-03-09
  Administered 2014-03-05 – 2014-03-08 (×8): 6.25 mg via ORAL
  Filled 2014-03-05 (×11): qty 1

## 2014-03-05 MED ORDER — CALCIUM GLUCONATE 10 % IV SOLN
1.0000 g | INTRAVENOUS | Status: AC
Start: 2014-03-05 — End: 2014-03-05
  Administered 2014-03-05 (×2): 1 g via INTRAVENOUS
  Filled 2014-03-05 (×2): qty 10

## 2014-03-05 MED ORDER — NICARDIPINE HCL 2.5 MG/ML IV SOLN
5.00 mg/h | INTRAVENOUS | Status: DC
Start: 2014-03-05 — End: 2014-03-06
  Administered 2014-03-06: 5 mg/h via INTRAVENOUS
  Filled 2014-03-05 (×2): qty 10

## 2014-03-05 MED ORDER — FUROSEMIDE 10 MG/ML IJ SOLN
40.00 mg | Freq: Once | INTRAMUSCULAR | Status: AC
Start: 2014-03-06 — End: 2014-03-05
  Administered 2014-03-05: 40 mg via INTRAVENOUS
  Filled 2014-03-05: qty 4

## 2014-03-05 MED ORDER — CADEXOMER IODINE 0.9 % EX GEL
Freq: Every day | CUTANEOUS | Status: DC
Start: 2014-03-05 — End: 2014-03-14
  Filled 2014-03-05 (×2): qty 40

## 2014-03-05 MED ORDER — FLUTICASONE PROPIONATE HFA 220 MCG/ACT IN AERO
2.00 | INHALATION_SPRAY | Freq: Two times a day (BID) | RESPIRATORY_TRACT | Status: DC
Start: 2014-03-05 — End: 2014-03-14
  Administered 2014-03-05 – 2014-03-14 (×14): 2 via RESPIRATORY_TRACT
  Filled 2014-03-05 (×2): qty 12

## 2014-03-05 NOTE — Consults (Signed)
Wound Ostomy Continence Consultation    Date Time: 03/05/2014 2:53 PM  Patient Name: [name], [name]  Consulting Service: WOCN  Reason for Consult: See regarding RLE chronic ulcer    Assessment & Plan   Assessment   63 year old female known to team. Post op day 1 after CABG.   Chronic wound to RLE 12cmx3cmx0.2cm.   Wound bed with desiccated bone/tendon distally and nongranular proximal tissue.   Hx. Of stent to RLE.    Followed as outpatient by wound clinic.  Has been using Medihoney topically.  Will start Iodosorb gel to provide antimicrobrial and facilitate debridement.         Plan:  Will follow.    Objective Findings   Specialty Bed:           History of Presenting Illness   HPI:   This is a 63 y.o. female with underlying DM, CAD admitted with NSTEMI.    Past Medical and Surgical History     Past Medical History   Diagnosis Date   . Diabetes mellitus    . Chronic kidney disease    . Myocardial infarction    . Hypertension    . Hyperlipidemia    . Peripheral arterial disease      06/28/13 right superficial femoral artery stenosis, tibial peroneal trunk stenosis, left CVL a stenosis, treated with angiography and angioplasty.   . Coronary artery disease      Left circumflex artery presumed DES.,  Chronically occluded right coronary artery stent   . Anemia      She redid to chronic disease and iron deficiency.     . Arterial leg ulcer      2014, receiving home health   . Chronic obstructive pulmonary disease      Previously on Spiriva and Advair       Past Surgical History   Procedure Laterality Date   . Cardiac angiography and angioplasty  06/2013   . Colonoscopy  Unknown   . Tubal ligation         Family History     Family History   Problem Relation Age of Onset   . Coronary artery disease Mother      Died at 60 from MI.   Marland Kitchen Coronary artery disease Father      Died at 43 from MI   . Diabetes type II       Mother and father       Lab   Significant Lab Values:    Recent Labs      03/05/14   1202  03/05/14   0150   WBC    --   11.40*   RBC   --   3.33*   HEMOGLOBIN   --   8.6*   HEMATOCRIT   --   28.4*   SODIUM  138  137   POTASSIUM  5.0  4.2   CHLORIDE  108  108   CO2  22  23   BUN  28.0*  25.0*   CREATININE  2.0*  1.8*   CALCIUM  9.1  8.5   ALBUMIN   --   3.0*   EGFR  30.3  34.2   GLUCOSE  148*  [address], [place]  [phone]  Wound, Ostomy, Continence Nurse

## 2014-03-05 NOTE — Progress Notes (Signed)
Rechecked lytes for occ. PVCs. K+ 4.0. Pt creatinine is 1.5- falls out of replacement protocol. PA Durdan aware. Will recheck labs later in AM and continue to monitor pt status.

## 2014-03-05 NOTE — Progress Notes (Signed)
* No values recorded between 03/04/2014  2:34 PM and 03/04/2014  6:41 PM *   PULMONARY MEDICINE PROGRESS NOTE    Date Time: 03/05/2014 9:58 AM  Patient Name: [name], [name]     Patient Active Problem List   Diagnosis   . NSTEMI (non-ST elevated myocardial infarction)   . Hypertensive urgency   . PAD (peripheral artery disease)   . Arterial leg ulcer   . Chronic obstructive pulmonary disease, unspecified COPD type   . Ischemic cardiomyopathy          Interval History     : Patient 1 day post-op and looks excellent - Extubated with no SOB or acute respiratory issues      Meds:     Scheduled Meds:  Current Facility-Administered Medications   Medication Dose Route Frequency   . albuterol-ipratropium  3 mL Nebulization Q6H SCH   . amiodarone  200 mg Oral Q12H SCH   . aspirin  325 mg Oral Daily   . atorvastatin  10 mg Oral QHS   . cefuroxime  1.5 g Intravenous Q8H   . chlorhexidine  15 mL Mouth/Throat Q12H SCH   . famotidine  20 mg Oral Q12H SCH   . gabapentin  600 mg Oral Q12H SCH   . mupirocin   Nasal Q12H SCH   . propranolol  10 mg Oral 4 times per day   . senna-docusate  1 tablet Oral QHS   . venlafaxine  75 mg Oral Q12H SCH     Continuous Infusions:  . sodium chloride 85 mL/hr at 03/05/14 0800   . insulin (regular) infusion 1 Units/hr (03/05/14 0800)   . milrinone Stopped (03/05/14 0500)   . nitroglycerin     . nitroprusside     . norepinephrine (LEVOPHED) infusion Stopped (03/05/14 0500)   . propofol Stopped (03/04/14 2015)     PRN Meds:.acetaminophen, bisacodyl, dextrose, hydrALAZINE, HYDROmorphone, lactated ringers, magnesium sulfate, ondansetron **OR** ondansetron, oxyCODONE-acetaminophen, potassium chloride, sodium phosphate IVPB, sodium phosphate IVPB, sodium phosphate IVPB, traMADol      Physical Exam:     Filed Vitals:    03/05/14 0840   BP:    Pulse: 87   Temp:    Resp: 19   SpO2: 99%     Temp (24hrs), Avg:98.2 F (36.8 C), Min:97.8 F (36.6 C), Max:98.5 F (36.9 C)      General: Alert in NAD  Neck:      Supple with normal CVP  Chest:    Clear  Heart:     Regular  Abdomen: Soft  Extremities: No edema  Other:    Input/Output:  Intake and Output Summary (Last 24 hours) at Date Time    Intake/Output Summary (Last 24 hours) at 03/05/14 0958  Last data filed at 03/05/14 0800   Gross per 24 hour   Intake 5262.49 ml   Output   1760 ml   Net 3502.49 ml           Radiology:   CXR 12/19 -- Minimally increased interstitial marking and left basilar pleural thickening      Labs:     Recent Labs  Lab 03/05/14  0150 03/04/14  2233 03/04/14  1915 03/04/14  0336 03/03/14  0118   WBC 11.40*  --  9.40 7.21 6.58   RBC 3.33*  --  3.06* 3.13* 3.28*   HEMOGLOBIN 8.6*  --  7.8* 7.8* 8.1*   HEMATOCRIT 28.4*  --  26.0* 26.7* 27.5*   GLUCOSE 112*  --  128* 122* 129*   BUN 25.0*  --  23.0* 26.0* 23.0*   CREATININE 1.8*  --  1.5* 1.7* 1.4*   CALCIUM 8.5  --  8.6 8.1* 8.2*   SODIUM 137  --  139 137 136   POTASSIUM 4.2 4.0 4.2 4.4 4.6   CHLORIDE 108  --  107 106 106   CO2 23  --  24 28 23            Assessment:   COPD  Wheezing reported per history, no active wheezing  Spirometry/PFT Abnormality (Moderate obstruction on PFT's with moderate reduction in diffusing capacity.)  Multivessel CAD, EF 45%, moderate AS  PVD  CKD with metabolic acidosis  Currently stable 1 day post-op    Plan:   1. Will add Flovent 220 2 BID - avoid LABA's for time being due to atrial fib risk  2.  Other Rx per CV surgery          Signed by: Hinton Rao, MD

## 2014-03-05 NOTE — Anesthesia Postprocedure Evaluation (Signed)
Anesthesia Post Evaluation    Patient: [name]    Procedures performed: Procedure(s) with comments:  CORONARY ARTERY BYPASS - LIMA to LAD  SVG to OM  ENDOSCOPIC,VEIN HARVEST - By D. Lazarus Gowda, RNFA. Left groin to mid-calf.  TEE - probe [id number]    Anesthesia type: General ETT    Patient location: CVICU    Post pain: Patient not complaining of pain, continue current therapy     Post assessment: no apparent anesthetic complications, no reportable events and no evidence of recall    Last vitals:   Filed Vitals:    03/05/14 0840   BP:    Pulse: 87   Temp:    Resp: 19   SpO2: 99%       Post vital signs: stable    Level of consciousness: awake and alert     Respiratory: tolerating nasal cannula    Cardiovascular: stable    Other complications: none

## 2014-03-05 NOTE — Progress Notes (Addendum)
CV SURGERY ICU PROGRESS NOTE      POD: 2    Surgery:  1. CABG x 2      EF: 45%    Surgeon: Austin Miles, MD      Assessment/Plan:     Active Problems:    NSTEMI (non-ST elevated myocardial infarction)    Hypertensive urgency    PAD (peripheral artery disease)    Arterial leg ulcer    Chronic obstructive pulmonary disease, unspecified COPD type    Ischemic cardiomyopathy    CAD (coronary artery disease)    Diabetes    Chronic kidney disease      Neurologic/Psych:    History of chronic pain - continue Neurontin    History of anxiety - continue Effexor    Cardiovascular:   CAD - s/p CABG.  History of PCI to RCA and Cx.  OM bypassed.   Moderate AS - medical management   HTN - BP 120s-150s.  Continue to titrate Coreg as needed.  No ACE yet secondary to AKI and BP 170s this AM.  Started PO hydralazine.   HLD - continue statin   PAD - known right iliac artery stenosis and right foot ulcer, with history of angioplasty to that leg.  Will need vascular consult before discharge.   Continue Amiodarone for AF prophylaxis.   Remains in SR 70s.     Core Measures: Aspirin - yes; Beta blocker - yes; Statin - yes, ACE - N/A secondary to AKI   Pacing wires: in    Drips: insulin    Pulmonary :   COPD - pulmonary follows.  Continue Flovent and Duonebs   Stable on nasal cannula.     Chest tubes (past 12 hours): 190 mL   Chest X-ray: stable, BL atelectasis, no ptx    Gastrointestinal:   Continue to advance diet as tolerated.     GI prophylaxis: Pepcid    Renal/GU:   CKD - base creatinine of 1.3-1.5.     AKI after surgery - creatinine as high as 2 yesterday.  Currently higher this AM at 2.2.  Renal following   Continue IVF at 75/hour per their recommendations.   Received Lasix x 1 over night for low urine output   Mild hyperkalemia - up to 5.3 this AM.  Repeat BMP at 10 am today.   Creatinine 2.2, I/O past 24 hours: +1180  (overall 5.8 liters positive)   Foley: in for strict I/O    Hematological:   Anemia -  multifactorial.     Continue ASA for CAD.     Start SC heparin this AM   Hematocrit: 29.2, Platelets: 181   DVT prophylaxis: SCDs and SC heparin    Infectious Disease:   Preop UTI - treated.    RLE wound - wound care following.  Follows with an outpatient wound clinic. No current antibiotics.  Scanned preop and not osteo.    WBC: 12.9   Lines: right IJ cordis 12/18    Endocrinological:   Diabetes - insulin drip for now.  Endocrine following.   HgbA1c: 7.5    Disposition:   Can transfer when creatinine stabilizes.  Will likely stay here today secondary to rise in creatinine over night.    Subjective:   In bed.  Awake and alert.  Complains of incisional pain.        Objective:   Vital signs for last 24 hours:  Temp:  [97.9 F (36.6 C)-98.3 F (36.8 C)] 98.2 F (36.8 C)  Heart Rate:  [70-87] 76  Resp Rate:  [10-33] 15  BP: (129-169)/(60-95) 156/71 mmHg  Arterial Line BP: (123-142)/(49-57) 133/51 mmHg    Neuro: intact  Cardiac: RRR  Lungs: diminished BL, poor inspiratory effort  Abdomen: soft, NT, positive BS  Extremities: warm, no edema, RLE wrapped secondary to wound  Incisions: dressing dry and intact      Medications:   Scheduled Meds:  Current Facility-Administered Medications   Medication Dose Route Frequency   . albuterol-ipratropium  3 mL Nebulization Q6H SCH   . amiodarone  200 mg Oral Q12H SCH   . aspirin  325 mg Oral Daily   . atorvastatin  10 mg Oral QHS   . cadexomer iodine   Topical Daily   . carvedilol  6.25 mg Oral Q12H SCH   . chlorhexidine  15 mL Mouth/Throat Q12H SCH   . famotidine  20 mg Oral Q12H SCH   . fluticasone  2 puff Inhalation BID   . gabapentin  600 mg Oral Q12H SCH   . mupirocin   Nasal Q12H SCH   . senna-docusate  1 tablet Oral QHS   . venlafaxine  75 mg Oral Q12H SCH     Continuous Infusions:  . sodium chloride 75 mL/hr at 03/06/14 0400   . insulin (regular) infusion 1.5 Units/hr (03/06/14 0400)   . milrinone Stopped (03/05/14 0500)   . niCARdipine (CARDENE) infusion     .  nitroglycerin     . nitroprusside     . norepinephrine (LEVOPHED) infusion Stopped (03/05/14 0500)   . propofol Stopped (03/04/14 2015)         Labs:   CBC:   Recent Labs      03/06/14   0121   WBC  12.97*   HEMOGLOBIN  8.6*   HEMATOCRIT  29.2*   PLATELETS  181     BMP:   Recent Labs      03/06/14   0121   03/04/14   0336   SODIUM  137   < >  137   POTASSIUM  5.3*   < >  4.4   CHLORIDE  110   < >  106   CO2  18*   < >  28   BUN  32.0*   < >  26.0*   CREATININE  2.2*   < >  1.7*   GLUCOSE  142*   < >  122*   MAGNESIUM  2.7*   < >  2.1   CALCIUM  8.7   < >  8.1*   PHOSPHORUS   --    --   3.6    < > = values in this interval not displayed.     Antionette Char, PA-C      Patient discussed with PA on rounds and I agree with the plan.  Austin Miles, MD

## 2014-03-05 NOTE — Progress Notes (Signed)
[facility], [employer].  Progress Note    Assessment:  1.  NSTEMI/3 vessel CAD (cardiac cath @ [facility]) -> transfer from [facility] for CABG consideration - s/p 2 vessel CABG (LIMA -> LADl SVG -> OM1)  2.  Acute on CRF; non oliguric; multifactorial etiology; mild rise in serum creatinine (suspect hemodynamically mediated due to fluctuations in BP)  3.  Metabolic acidosis (mild); non anion gap; resolved  4.  Anemia; normocytic/mildly hypochromic  5.  Htn; fluctuating    Plan:  1.  Continue low level IVFs: NS @ 75cc/hr  2.  Aranesp/Venofer ->  3.  May titrate Propranolol to 20mg  po q6 hours per course      Further recommendations to follow      Thank you    Louellen Molder, MD  Office - [phone]  [employer] - [phone]  ++++++++++++++++++++++++++++++++++++++++++++++++++++++++++++++  Subjective:  No new complaints    Medications:  Scheduled Meds:  Current Facility-Administered Medications   Medication Dose Route Frequency   . albuterol-ipratropium  3 mL Nebulization Q6H SCH   . amiodarone  200 mg Oral Q12H SCH   . aspirin  325 mg Oral Daily   . atorvastatin  10 mg Oral QHS   . chlorhexidine  15 mL Mouth/Throat Q12H SCH   . famotidine  20 mg Oral Q12H SCH   . fluticasone  2 puff Inhalation BID   . gabapentin  600 mg Oral Q12H SCH   . mupirocin   Nasal Q12H SCH   . propranolol  10 mg Oral 4 times per day   . senna-docusate  1 tablet Oral QHS   . venlafaxine  75 mg Oral Q12H SCH     Continuous Infusions:  . sodium chloride 85 mL/hr at 03/05/14 0800   . insulin (regular) infusion 1 Units/hr (03/05/14 0800)   . milrinone Stopped (03/05/14 0500)   . nitroglycerin     . nitroprusside     . norepinephrine (LEVOPHED) infusion Stopped (03/05/14 0500)   . propofol Stopped (03/04/14 2015)     PRN Meds:acetaminophen, bisacodyl, dextrose, hydrALAZINE, HYDROmorphone, lactated ringers, magnesium sulfate, ondansetron **OR** ondansetron, oxyCODONE-acetaminophen, potassium chloride, sodium phosphate  IVPB, sodium phosphate IVPB, sodium phosphate IVPB, traMADol    Objective:  Vital signs in last 24 hours:  Temp:  [97.8 F (36.6 C)-98.5 F (36.9 C)] 98.5 F (36.9 C)  Heart Rate:  [69-87] 87  Resp Rate:  [9-23] 19  BP: (91-184)/(54-80) 131/61 mmHg  Arterial Line BP: (94-140)/(45-57) 140/57 mmHg  FiO2:  [40 %-100 %] 40 %  Intake/Output last 24 hours:    Intake/Output Summary (Last 24 hours) at 03/05/14 1138  Last data filed at 03/05/14 0800   Gross per 24 hour   Intake 5124.49 ml   Output   1760 ml   Net 3364.49 ml     Intake/Output this shift:  I/O this shift:  In: 285.7 [I.V.:85.7; IV Piggyback:200]  Out: 55 [Urine:45; Chest Tube:10]    Physical Exam:   Gen: Pt sitting in chair   CV: S1 S2 N RRR   Chest: No rales/no rhonchi; diminished bilaterally   Ab: ND NT soft no HSM +BS   Ext: No edema    Labs:    Recent Labs  Lab 03/05/14  0150 03/04/14  2233 03/04/14  1915 03/04/14  0336 03/03/14  0118 03/02/14  0120  02/28/14  1728   GLUCOSE 112*  --  128* 122* 129* 160*  < > 138*  BUN 25.0*  --  23.0* 26.0* 23.0* 24.0*  < > 24.0*   CREATININE 1.8*  --  1.5* 1.7* 1.4* 1.5*  < > 1.3*   CALCIUM 8.5  --  8.6 8.1* 8.2* 8.1*  < > 8.8   SODIUM 137  --  139 137 136 136  < > 138   POTASSIUM 4.2 4.0 4.2 4.4 4.6 4.6  < > 4.8   CHLORIDE 108  --  107 106 106 107  < > 108   CO2 23  --  24 28 23 22   < > 23   ALBUMIN 3.0*  --   --   --   --   --   --  2.7*   PHOSPHORUS  --   --   --  3.6 3.5 3.2  < > 2.7   MAGNESIUM 2.7* 2.5 2.9* 2.1 2.3 2.1  < > 1.9   < > = values in this interval not displayed.    Recent Labs  Lab 03/05/14  0150 03/04/14  1915 03/04/14  0336   WBC 11.40* 9.40 7.21   HEMOGLOBIN 8.6* 7.8* 7.8*   HEMATOCRIT 28.4* 26.0* 26.7*   MCV 85.3 85.0 85.3   MCH, POC 25.8* 25.5* 24.9*   MCHC 30.3* 30.0* 29.2*   RDW 18* 17* 18*   MPV 9.5 10.5 10.1   PLATELETS 176 169 243

## 2014-03-05 NOTE — Plan of Care (Signed)
Problem: Safety  Goal: Patient will be free from injury during hospitalization  Outcome: Progressing  Call light within reach, fall mats in place. Bed exit alarm activated.    Problem: Pain  Goal: Patient's pain/discomfort is manageable  Outcome: Progressing  C/o pain post extubation. Medicated with 0.25 of dilaudid with good relief.    Problem: Post-op Cardiac Surgery  Goal: Maintain adequate oxygentation  Outcome: Progressing  Extubated @ 0001 to 6L NC. sats 100%.  Goal: Adequate cardiac output  Outcome: Progressing  Stable HR and BP. Remains on Levo @2 . Last index 3.2. Weaning Milrinone per PA.

## 2014-03-05 NOTE — Plan of Care (Signed)
Problem: Post-op Cardiac Surgery  Goal: Maintain adequate oxygentation  Outcome: Progressing  Weaned off supplemental O2, SpO2 maintaining at 96%.    Goal: Adequate cardiac output  Outcome: Progressing  Last cardiac index off of milrinone was 3.1.  Received an order to remove PA catheter from Lemar Lofty, PA.    Goal: Early mobilization is achieved  Outcome: Progressing  Patient OOB to chair for about three hours.  Two person assist, able to bear weight on left leg.  Right leg painful when weight bearing.

## 2014-03-05 NOTE — Plan of Care (Signed)
Problem: Safety  Goal: Patient will be free from injury during hospitalization  Intervention: Assess patient's risk for falls and implement fall prevention plan of care per policy  Patient educated on safety regarding plan to call for help to prevent falls.  Fall interventions implemented and documented.  Patient verbalized understanding of fall risks

## 2014-03-06 ENCOUNTER — Inpatient Hospital Stay: Payer: BLUE CROSS/BLUE SHIELD

## 2014-03-06 LAB — GFR
EGFR: 27.1
EGFR: 25.8
EGFR: 24.5

## 2014-03-06 LAB — CBC
Hematocrit: 29.2 % — ABNORMAL LOW (ref 37.0–47.0)
Hgb: 8.6 g/dL — ABNORMAL LOW (ref 12.0–16.0)
MCH: 25.9 pg — ABNORMAL LOW (ref 28.0–32.0)
MCHC: 29.5 g/dL — ABNORMAL LOW (ref 32.0–36.0)
MCV: 88 fL (ref 80.0–100.0)
MPV: 10.1 fL (ref 9.4–12.3)
Nucleated RBC: 1 /100 WBC (ref 0–1)
Platelets: 181 10*3/uL (ref 140–400)
RBC: 3.32 10*6/uL — ABNORMAL LOW (ref 4.20–5.40)
RDW: 19 % — ABNORMAL HIGH (ref 12–15)
WBC: 12.97 10*3/uL — ABNORMAL HIGH (ref 3.50–10.80)

## 2014-03-06 LAB — GLUCOSE WHOLE BLOOD - POCT
Whole Blood Glucose POCT: 130 mg/dL — ABNORMAL HIGH (ref 70–100)
Whole Blood Glucose POCT: 132 mg/dL — ABNORMAL HIGH (ref 70–100)
Whole Blood Glucose POCT: 121 mg/dL — ABNORMAL HIGH (ref 70–100)
Whole Blood Glucose POCT: 120 mg/dL — ABNORMAL HIGH (ref 70–100)
Whole Blood Glucose POCT: 162 mg/dL — ABNORMAL HIGH (ref 70–100)
Whole Blood Glucose POCT: 164 mg/dL — ABNORMAL HIGH (ref 70–100)
Whole Blood Glucose POCT: 150 mg/dL — ABNORMAL HIGH (ref 70–100)
Whole Blood Glucose POCT: 146 mg/dL — ABNORMAL HIGH (ref 70–100)
Whole Blood Glucose POCT: 140 mg/dL — ABNORMAL HIGH (ref 70–100)
Whole Blood Glucose POCT: 205 mg/dL — ABNORMAL HIGH (ref 70–100)
Whole Blood Glucose POCT: 253 mg/dL — ABNORMAL HIGH (ref 70–100)
Whole Blood Glucose POCT: 189 mg/dL — ABNORMAL HIGH (ref 70–100)
Whole Blood Glucose POCT: 144 mg/dL — ABNORMAL HIGH (ref 70–100)
Whole Blood Glucose POCT: 114 mg/dL — ABNORMAL HIGH (ref 70–100)
Whole Blood Glucose POCT: 114 mg/dL — ABNORMAL HIGH (ref 70–100)

## 2014-03-06 LAB — BASIC METABOLIC PANEL
BUN: 32 mg/dL — ABNORMAL HIGH (ref 7.0–19.0)
BUN: 33 mg/dL — ABNORMAL HIGH (ref 7.0–19.0)
BUN: 36 mg/dL — ABNORMAL HIGH (ref 7.0–19.0)
CO2: 18 mEq/L — ABNORMAL LOW (ref 22–29)
CO2: 19 mEq/L — ABNORMAL LOW (ref 22–29)
CO2: 21 mEq/L — ABNORMAL LOW (ref 22–29)
Calcium: 8.7 mg/dL (ref 8.5–10.5)
Calcium: 8.4 mg/dL — ABNORMAL LOW (ref 8.5–10.5)
Calcium: 8.3 mg/dL — ABNORMAL LOW (ref 8.5–10.5)
Chloride: 110 mEq/L (ref 100–111)
Chloride: 109 mEq/L (ref 100–111)
Chloride: 110 mEq/L (ref 100–111)
Creatinine: 2.2 mg/dL — ABNORMAL HIGH (ref 0.6–1.0)
Creatinine: 2.3 mg/dL — ABNORMAL HIGH (ref 0.6–1.0)
Creatinine: 2.4 mg/dL — ABNORMAL HIGH (ref 0.6–1.0)
Glucose: 142 mg/dL — ABNORMAL HIGH (ref 70–100)
Glucose: 185 mg/dL — ABNORMAL HIGH (ref 70–100)
Glucose: 157 mg/dL — ABNORMAL HIGH (ref 70–100)
Potassium: 5.3 mEq/L — ABNORMAL HIGH (ref 3.5–5.1)
Potassium: 4.9 mEq/L (ref 3.5–5.1)
Potassium: 5 mEq/L (ref 3.5–5.1)
Sodium: 137 mEq/L (ref 136–145)
Sodium: 138 mEq/L (ref 136–145)
Sodium: 138 mEq/L (ref 136–145)

## 2014-03-06 LAB — MAGNESIUM: Magnesium: 2.7 mg/dL — ABNORMAL HIGH (ref 1.6–2.6)

## 2014-03-06 MED ORDER — ONDANSETRON HCL 4 MG/2ML IJ SOLN
4.00 mg | Freq: Every day | INTRAMUSCULAR | Status: DC | PRN
Start: 2014-03-06 — End: 2014-03-14

## 2014-03-06 MED ORDER — IRON SUCROSE 20 MG/ML IV SOLN
200.00 mg | INTRAVENOUS | Status: AC
Start: 2014-03-06 — End: 2014-03-08
  Administered 2014-03-06 – 2014-03-08 (×3): 200 mg via INTRAVENOUS
  Filled 2014-03-06 (×3): qty 10

## 2014-03-06 MED ORDER — HEPARIN SODIUM (PORCINE) 5000 UNIT/ML IJ SOLN
5000.00 [IU] | Freq: Two times a day (BID) | INTRAMUSCULAR | Status: DC
Start: 2014-03-06 — End: 2014-03-06
  Administered 2014-03-06: 5000 [IU] via SUBCUTANEOUS
  Filled 2014-03-06: qty 1

## 2014-03-06 MED ORDER — POLYETHYLENE GLYCOL 3350 17 G PO PACK
17.00 g | PACK | Freq: Every day | ORAL | Status: DC
Start: 2014-03-06 — End: 2014-03-14
  Administered 2014-03-06 – 2014-03-09 (×4): 17 g via ORAL
  Filled 2014-03-06 (×8): qty 1

## 2014-03-06 MED ORDER — HYDRALAZINE HCL 25 MG PO TABS
25.0000 mg | ORAL_TABLET | Freq: Three times a day (TID) | ORAL | Status: DC
Start: 2014-03-06 — End: 2014-03-09
  Administered 2014-03-06 – 2014-03-09 (×9): 25 mg via ORAL
  Filled 2014-03-06 (×10): qty 1

## 2014-03-06 MED ORDER — OXYCODONE-ACETAMINOPHEN 5-325 MG PO TABS
1.00 | ORAL_TABLET | ORAL | Status: DC | PRN
Start: 2014-03-06 — End: 2014-03-14
  Administered 2014-03-07 – 2014-03-14 (×15): 1 via ORAL
  Filled 2014-03-06 (×15): qty 1

## 2014-03-06 MED ORDER — DEXTROSE 50 % IV SOLN
50.00 mL | INTRAVENOUS | Status: DC | PRN
Start: 2014-03-06 — End: 2014-03-09

## 2014-03-06 MED ORDER — SODIUM CHLORIDE 0.45 % IV SOLN
INTRAVENOUS | Status: DC
Start: 2014-03-06 — End: 2014-03-08
  Filled 2014-03-06 (×2): qty 1000

## 2014-03-06 MED ORDER — INSULIN REGULAR HUMAN 100 UNIT/ML IJ SOLN
0.50 [IU]/h | INTRAMUSCULAR | Status: DC
Start: 2014-03-06 — End: 2014-03-09
  Administered 2014-03-06: 2.2 [IU]/h via INTRAVENOUS
  Filled 2014-03-06 (×2): qty 1

## 2014-03-06 MED ORDER — ACETAMINOPHEN 325 MG PO TABS
650.00 mg | ORAL_TABLET | ORAL | Status: DC | PRN
Start: 2014-03-06 — End: 2014-03-14
  Administered 2014-03-08 – 2014-03-14 (×2): 650 mg via ORAL
  Filled 2014-03-06 (×2): qty 2

## 2014-03-06 MED ORDER — INSULIN GLARGINE 100 UNIT/ML SC SOLN
15.0000 [IU] | Freq: Every evening | SUBCUTANEOUS | Status: DC
Start: 2014-03-06 — End: 2014-03-07
  Administered 2014-03-06: 15 [IU] via SUBCUTANEOUS

## 2014-03-06 MED ORDER — TRAMADOL HCL 50 MG PO TABS
50.00 mg | ORAL_TABLET | ORAL | Status: DC | PRN
Start: 2014-03-06 — End: 2014-03-14
  Administered 2014-03-07 – 2014-03-13 (×7): 50 mg via ORAL
  Filled 2014-03-06 (×10): qty 1

## 2014-03-06 MED ORDER — HEPARIN SODIUM (PORCINE) 5000 UNIT/ML IJ SOLN
5000.00 [IU] | Freq: Two times a day (BID) | INTRAMUSCULAR | Status: DC
Start: 2014-03-06 — End: 2014-03-14
  Administered 2014-03-06 – 2014-03-14 (×16): 5000 [IU] via SUBCUTANEOUS
  Filled 2014-03-06 (×16): qty 1

## 2014-03-06 MED ORDER — SENNOSIDES-DOCUSATE SODIUM 8.6-50 MG PO TABS
1.00 | ORAL_TABLET | Freq: Every evening | ORAL | Status: DC
Start: 2014-03-06 — End: 2014-03-09
  Administered 2014-03-06 – 2014-03-08 (×3): 1 via ORAL
  Filled 2014-03-06 (×3): qty 1

## 2014-03-06 NOTE — Progress Notes (Signed)
[facility], [employer].  Progress Note    Assessment:  1.  NSTEMI/3 vessel CAD (cardiac cath @ [facility]) -> transfer from [facility] for CABG consideration - s/p 2 vessel CABG (LIMA -> LADl SVG -> OM1)  2.  Acute on CRF; non oliguric; multifactorial etiology; slowly rising serum creatinine (suspect hemodynamically mediated due to fluctuations in BP + probable ATN picture post op)  3.  Metabolic acidosis (mild); non anion gap  4.  Anemia; normocytic/mildly hypochromic  5.  Htn; fluctuating    Plan:  1.  Adjust carrier IVFs; 1/2ns + NaHCO3 102meq/L @ 40cc/hr (compatible with Insulin as infusion)  2.  Aranesp/Venofer ->  3.  Tighter hold parameters for antihypertensives      Further recommendations to follow      Thank you    Louellen Molder, MD  Office - [phone]  Spect[employer] Link - [phone]  ++++++++++++++++++++++++++++++++++++++++++++++++++++++++++++++  Subjective:  No new complaints    Medications:  Scheduled Meds:  Current Facility-Administered Medications   Medication Dose Route Frequency   . albuterol-ipratropium  3 mL Nebulization Q6H SCH   . amiodarone  200 mg Oral Q12H SCH   . aspirin  325 mg Oral Daily   . atorvastatin  10 mg Oral QHS   . cadexomer iodine   Topical Daily   . carvedilol  6.25 mg Oral Q12H SCH   . chlorhexidine  15 mL Mouth/Throat Q12H SCH   . famotidine  20 mg Oral Q12H SCH   . fluticasone  2 puff Inhalation BID   . gabapentin  600 mg Oral Q12H SCH   . heparin (porcine)  5,000 Units Subcutaneous Q12H [facility]   . hydrALAZINE  25 mg Oral Q8H SCH   . insulin glargine  15 Units Subcutaneous QHS   . mupirocin   Nasal Q12H SCH   . senna-docusate  1 tablet Oral QHS   . venlafaxine  75 mg Oral Q12H SCH     Continuous Infusions:  . insulin (regular) infusion 3.3 Units/hr (03/06/14 1143)   . niCARdipine (CARDENE) infusion Stopped (03/06/14 1000)   . custom IV infusion builder       PRN Meds:acetaminophen, bisacodyl, dextrose, hydrALAZINE, HYDROmorphone, lactated ringers,  magnesium sulfate, ondansetron **OR** ondansetron, oxyCODONE-acetaminophen, potassium chloride, traMADol    Objective:  Vital signs in last 24 hours:  Temp:  [97.9 F (36.6 C)-99 F (37.2 C)] 99 F (37.2 C)  Heart Rate:  [73-79] 73  Resp Rate:  [10-33] 13  BP: (122-186)/(53-91) 122/53 mmHg  Intake/Output last 24 hours:    Intake/Output Summary (Last 24 hours) at 03/06/14 1227  Last data filed at 03/06/14 1100   Gross per 24 hour   Intake 2508.92 ml   Output   2085 ml   Net 423.92 ml     Intake/Output this shift:  I/O this shift:  In: 304.53 [I.V.:304.53]  Out: 515 [Urine:405; Chest Tube:110]    Physical Exam:   Gen: Pt sitting in chair   CV: S1 S2 N RRR   Chest: No rales/no rhonchi; diminished bilaterally   Ab: ND NT soft no HSM +BS   Ext: No edema    Labs:    Recent Labs  Lab 03/06/14  0952 03/06/14  0121 03/05/14  1202 03/05/14  0150 03/04/14  2233  03/04/14  0336 03/03/14  0118 03/02/14  0120  02/28/14  1728   GLUCOSE 185* 142* 148* 112*  --   < > 122* 129* 160*  < >  138*   BUN 33.0* 32.0* 28.0* 25.0*  --   < > 26.0* 23.0* 24.0*  < > 24.0*   CREATININE 2.3* 2.2* 2.0* 1.8*  --   < > 1.7* 1.4* 1.5*  < > 1.3*   CALCIUM 8.4* 8.7 9.1 8.5  --   < > 8.1* 8.2* 8.1*  < > 8.8   SODIUM 138 137 138 137  --   < > 137 136 136  < > 138   POTASSIUM 4.9 5.3* 5.0 4.2 4.0  < > 4.4 4.6 4.6  < > 4.8   CHLORIDE 109 110 108 108  --   < > 106 106 107  < > 108   CO2 19* 18* 22 23  --   < > 28 23 22   < > 23   ALBUMIN  --   --   --  3.0*  --   --   --   --   --   --  2.7*   PHOSPHORUS  --   --   --   --   --   --  3.6 3.5 3.2  < > 2.7   MAGNESIUM  --  2.7*  --  2.7* 2.5  < > 2.1 2.3 2.1  < > 1.9   < > = values in this interval not displayed.    Recent Labs  Lab 03/06/14  0121 03/05/14  0150 03/04/14  1915   WBC 12.97* 11.40* 9.40   HEMOGLOBIN 8.6* 8.6* 7.8*   HEMATOCRIT 29.2* 28.4* 26.0*   MCV 88.0 85.3 85.0   MCH, POC 25.9* 25.8* 25.5*   MCHC 29.5* 30.3* 30.0*   RDW 19* 18* 17*   MPV 10.1 9.5 10.5   PLATELETS 181 176 169

## 2014-03-06 NOTE — Progress Notes (Signed)
Patient handoff report given to Tally Due, CVSD floating RN.

## 2014-03-06 NOTE — Progress Notes (Signed)
* No values recorded between 03/04/2014  2:34 PM and 03/04/2014  6:41 PM *   PULMONARY MEDICINE PROGRESS NOTE    Date Time: 03/06/2014 10:39 AM  Patient Name: [name], [name]     Patient Active Problem List   Diagnosis   . NSTEMI (non-ST elevated myocardial infarction)   . Hypertensive urgency   . PAD (peripheral artery disease)   . Arterial leg ulcer   . Chronic obstructive pulmonary disease, unspecified COPD type   . Ischemic cardiomyopathy   . CAD (coronary artery disease)   . Diabetes   . Chronic kidney disease          Interval History     : Patient overall doing well post cardiac surgery - breathing stable - rise in creatinine to 2.2 a concern      Meds:     Scheduled Meds:  Current Facility-Administered Medications   Medication Dose Route Frequency   . albuterol-ipratropium  3 mL Nebulization Q6H SCH   . amiodarone  200 mg Oral Q12H SCH   . aspirin  325 mg Oral Daily   . atorvastatin  10 mg Oral QHS   . cadexomer iodine   Topical Daily   . carvedilol  6.25 mg Oral Q12H SCH   . chlorhexidine  15 mL Mouth/Throat Q12H SCH   . famotidine  20 mg Oral Q12H SCH   . fluticasone  2 puff Inhalation BID   . gabapentin  600 mg Oral Q12H SCH   . heparin (porcine)  5,000 Units Subcutaneous Q12H [facility]   . hydrALAZINE  25 mg Oral Q8H SCH   . mupirocin   Nasal Q12H SCH   . senna-docusate  1 tablet Oral QHS   . venlafaxine  75 mg Oral Q12H SCH     Continuous Infusions:  . sodium chloride 75 mL/hr at 03/06/14 0900   . insulin (regular) infusion 1.2 Units/hr (03/06/14 0900)   . niCARdipine (CARDENE) infusion 5 mg/hr (03/06/14 0900)     PRN Meds:.acetaminophen, bisacodyl, dextrose, hydrALAZINE, HYDROmorphone, lactated ringers, magnesium sulfate, ondansetron **OR** ondansetron, oxyCODONE-acetaminophen, potassium chloride, sodium phosphate IVPB, sodium phosphate IVPB, sodium phosphate IVPB, traMADol      Physical Exam:     Filed Vitals:    03/06/14 0930   BP: 142/65   Pulse: 75   Temp:    Resp: 12   SpO2: 98%     Temp (24hrs), Avg:98.3  F (36.8 C), Min:97.9 F (36.6 C), Max:99 F (37.2 C)      General: Alert in NAD  Neck:     Supple with normal CVP  Chest:    Clear  Heart:     Regular  Abdomen: Soft  Extremities: No edema  Other:    Input/Output:  Intake and Output Summary (Last 24 hours) at Date Time    Intake/Output Summary (Last 24 hours) at 03/06/14 1039  Last data filed at 03/06/14 0900   Gross per 24 hour   Intake 2642.22 ml   Output   2110 ml   Net 532.22 ml           Radiology:   CXR 12/20 -- minimal basilar atelectasis      Labs:     Recent Labs  Lab 03/06/14  0952 03/06/14  0121 03/05/14  1202 03/05/14  0150  03/04/14  1915 03/04/14  0336   WBC  --  12.97*  --  11.40*  --  9.40 7.21   RBC  --  3.32*  --  3.33*  --  3.06* 3.13*   HEMOGLOBIN  --  8.6*  --  8.6*  --  7.8* 7.8*   HEMATOCRIT  --  29.2*  --  28.4*  --  26.0* 26.7*   GLUCOSE 185* 142* 148* 112*  --  128* 122*   BUN 33.0* 32.0* 28.0* 25.0*  --  23.0* 26.0*   CREATININE 2.3* 2.2* 2.0* 1.8*  --  1.5* 1.7*   CALCIUM 8.4* 8.7 9.1 8.5  --  8.6 8.1*   SODIUM 138 137 138 137  --  139 137   POTASSIUM 4.9 5.3* 5.0 4.2  < > 4.2 4.4   CHLORIDE 109 110 108 108  --  107 106   CO2 19* 18* 22 23  --  24 28   < > = values in this interval not displayed.        Assessment:   1.COPD with Spirometry/PFT Abnormality (Moderate obstruction on PFT's with moderate reduction in diffusing capacity.)  2. Multivessel CAD, EF 45%, moderate AS - s/p surgery 12/18 with stable post-op respiratory status  3. PVD  4. CKD with rise in creat to 2.3 12/20      Plan:   1. No current respiratory issues - Will not follow-up routinely.  Please call if problems/questions.                                                                  Signed by: Hinton Rao, MD

## 2014-03-06 NOTE — Interim Summary (Signed)
ENDOCRINE Interim Summary Note    Date Time: 03/06/2014 11:48 AM  Patient Name: [name]  Requesting Physician: Austin Miles, MD  Consulting Physician: [name], MD  Admission Date: 02/28/2014      Primary Care Physician: Karl Ito, MD    Assessment/Recommendations:     Patient Active Problem List    Diagnosis Date Noted   . CAD (coronary artery disease) 03/05/2014   . Diabetes 03/05/2014   . Chronic kidney disease 03/05/2014   . Ischemic cardiomyopathy    . NSTEMI (non-ST elevated myocardial infarction) 02/28/2014   . Hypertensive urgency    . PAD (peripheral artery disease)    . Arterial leg ulcer    . Chronic obstructive pulmonary disease, unspecified COPD type      [name] is a 63 y.o. woman who is s/p 2v CABG on 03/04/14 with a h/o DM, COPD, NSTEMI, PAD, and CKD with cr increased to 2.3 on an insulin drip  Diabetes Mellitus present on insulin drip:   Recommend continue the insulin drip per protocol    Today is POD#2, we will add Lantus 15 units tonight, in preparation for weaning off the drip tomorrow.     Thyroid:   With the amiodarone use, we will check a TSH level for tomorrow morning.        We will continue to follow with a formal consultation 03/07/14.     [name], MD  [facility] Endocrin[facility]  [phone] or Tiger Text or pager ID: [phone]   Hours Mon. - Friday 8- 5:30pm

## 2014-03-06 NOTE — Plan of Care (Signed)
Problem: Post-op Cardiac Surgery  Goal: Maintain adequate oxygentation  Outcome: Progressing    Assumed patient care from [facility] around 11am.  Pt AxOx3, VSS, SBP 120-140s.  Goal SBP 140s, hold scheduled Hydralazine 25mg .  SR 60-70s with occasional PVC, pacer wires grounded.  Chest tube left and right pleural to suction, no air leak.  Chest tubes mediastinals removed this afternoon.  Tolerated diet, hypoactive bowel sounds, foley removed at 3:30pm.  Insulin gtt per order.  Instead of normal saline, Dr. Tereso Newcomer ordered sodium chloride 1/2 with sodium bicarb at 40cc/hr.  Incision midsternal and left leg OTA, c/d/i.  Percocet for pain.  Foot ulcer on right ankle area, covered with dressing and ACE wrap.  Dressing changed this morning by Evaristo Bury. C/d/i.  Pt able to bear weight on right foot.  Report given to Deidre RN. Pt transferred to CVSD.

## 2014-03-06 NOTE — Plan of Care (Addendum)
A&Ox4. SR on tele. Wires grounded. HR 70's. Sating well on RA. CT x2 to suction. No air leak. On insulin gtt infusing per glucommander. R foot covered w/ ACE wrap, CDI. No BM but passing gas. Voids using bedside commode. OOB 1-2 person assist. Midsternal and L leg OTA, CDI. Pt needs constant reminder of sternal precautions. Fall and safety precautions maintained. Will continue to monitor.     0445- SBP elevated. (see flowsheet) PA notified. 25mg  Hydralazine given early. Will continue to monitor.

## 2014-03-06 NOTE — Progress Notes (Signed)
Received patient transfer from CVICU at 1630. A&O x3. VSS. SR on tele with occasional PVCs. AV wires grounded. Insulin gtt per protocol. Per CVICU RN, foley removed. Due to void by 2100. Midsternal incision OTA. Chest tubes x2 to -20 suction, no leak. O2 saturation 93% on room air. Tolerating diet. No BM yet. Patient in chair. x2 assist. Fall mat present, call bell within reach. Will continue to monitor.

## 2014-03-07 ENCOUNTER — Encounter: Payer: Self-pay | Admitting: Thoracic Surgery (Cardiothoracic Vascular Surgery)

## 2014-03-07 ENCOUNTER — Other Ambulatory Visit: Payer: BLUE CROSS/BLUE SHIELD

## 2014-03-07 LAB — CBC
Hematocrit: 28.7 % — ABNORMAL LOW (ref 37.0–47.0)
Hgb: 8.3 g/dL — ABNORMAL LOW (ref 12.0–16.0)
MCH: 26 pg — ABNORMAL LOW (ref 28.0–32.0)
MCHC: 28.9 g/dL — ABNORMAL LOW (ref 32.0–36.0)
MCV: 90 fL (ref 80.0–100.0)
MPV: 11 fL (ref 9.4–12.3)
Nucleated RBC: 3 /100 WBC — ABNORMAL HIGH (ref 0–1)
Platelets: 183 10*3/uL (ref 140–400)
RBC: 3.19 10*6/uL — ABNORMAL LOW (ref 4.20–5.40)
RDW: 19 % — ABNORMAL HIGH (ref 12–15)
WBC: 7.57 10*3/uL (ref 3.50–10.80)

## 2014-03-07 LAB — BASIC METABOLIC PANEL
BUN: 38 mg/dL — ABNORMAL HIGH (ref 7.0–19.0)
CO2: 21 mEq/L — ABNORMAL LOW (ref 22–29)
Calcium: 8.2 mg/dL — ABNORMAL LOW (ref 8.5–10.5)
Chloride: 109 mEq/L (ref 100–111)
Creatinine: 2.2 mg/dL — ABNORMAL HIGH (ref 0.6–1.0)
Glucose: 110 mg/dL — ABNORMAL HIGH (ref 70–100)
Potassium: 4.9 mEq/L (ref 3.5–5.1)
Sodium: 138 mEq/L (ref 136–145)

## 2014-03-07 LAB — MAGNESIUM: Magnesium: 2.5 mg/dL (ref 1.6–2.6)

## 2014-03-07 LAB — GLUCOSE WHOLE BLOOD - POCT
Whole Blood Glucose POCT: 102 mg/dL — ABNORMAL HIGH (ref 70–100)
Whole Blood Glucose POCT: 110 mg/dL — ABNORMAL HIGH (ref 70–100)
Whole Blood Glucose POCT: 112 mg/dL — ABNORMAL HIGH (ref 70–100)
Whole Blood Glucose POCT: 112 mg/dL — ABNORMAL HIGH (ref 70–100)
Whole Blood Glucose POCT: 116 mg/dL — ABNORMAL HIGH (ref 70–100)
Whole Blood Glucose POCT: 121 mg/dL — ABNORMAL HIGH (ref 70–100)
Whole Blood Glucose POCT: 128 mg/dL — ABNORMAL HIGH (ref 70–100)
Whole Blood Glucose POCT: 130 mg/dL — ABNORMAL HIGH (ref 70–100)
Whole Blood Glucose POCT: 134 mg/dL — ABNORMAL HIGH (ref 70–100)
Whole Blood Glucose POCT: 135 mg/dL — ABNORMAL HIGH (ref 70–100)
Whole Blood Glucose POCT: 166 mg/dL — ABNORMAL HIGH (ref 70–100)

## 2014-03-07 LAB — PHOSPHORUS: Phosphorus: 4 mg/dL (ref 2.3–4.7)

## 2014-03-07 LAB — GFR: EGFR: 27.1

## 2014-03-07 LAB — TSH: TSH: 1.08 u[IU]/mL (ref 0.35–4.94)

## 2014-03-07 MED ORDER — INSULIN ASPART 100 UNIT/ML SC SOLN
4.00 [IU] | Freq: Every day | SUBCUTANEOUS | Status: DC
Start: 2014-03-07 — End: 2014-03-08
  Administered 2014-03-07: 4 [IU] via SUBCUTANEOUS

## 2014-03-07 MED ORDER — INSULIN GLARGINE 100 UNIT/ML SC SOLN
12.0000 [IU] | Freq: Two times a day (BID) | SUBCUTANEOUS | Status: DC
Start: 2014-03-07 — End: 2014-03-08
  Administered 2014-03-07 (×2): 12 [IU] via SUBCUTANEOUS
  Filled 2014-03-07 (×2): qty 12

## 2014-03-07 MED ORDER — INSULIN ASPART 100 UNIT/ML SC SOLN
1.00 [IU] | Freq: Three times a day (TID) | SUBCUTANEOUS | Status: DC | PRN
Start: 2014-03-07 — End: 2014-03-09
  Administered 2014-03-07: 1 [IU] via SUBCUTANEOUS

## 2014-03-07 MED ORDER — GLUCAGON 1 MG IJ SOLR (WRAP)
1.00 mg | INTRAMUSCULAR | Status: DC | PRN
Start: 2014-03-07 — End: 2014-03-14

## 2014-03-07 MED ORDER — INSULIN ASPART 100 UNIT/ML SC SOLN
4.00 [IU] | Freq: Every day | SUBCUTANEOUS | Status: DC
Start: 2014-03-07 — End: 2014-03-08
  Administered 2014-03-07: 4 [IU] via SUBCUTANEOUS
  Filled 2014-03-07: qty 4

## 2014-03-07 MED ORDER — INSULIN ASPART 100 UNIT/ML SC SOLN
4.00 [IU] | Freq: Every morning | SUBCUTANEOUS | Status: DC
Start: 2014-03-08 — End: 2014-03-08
  Administered 2014-03-08: 4 [IU] via SUBCUTANEOUS

## 2014-03-07 MED ORDER — DEXTROSE 50 % IV SOLN
25.00 mL | INTRAVENOUS | Status: DC | PRN
Start: 2014-03-07 — End: 2014-03-14

## 2014-03-07 NOTE — PT Eval Note (Addendum)
[facility]   Physical Therapy Reassessment     Patient: [name]    MRN#: [id number]   [facility]  Bed: WU132/GM010-27    Discharge Recommendations:     Discharge Recommendation: Acute Rehab   DME Recommendation: tbd    If Acute Rehab recommended discharge disposition is not available, patient will need mod x 2 assist for mobility, , w/c for distances,bsc,hospital bed equipment, and continued skilled PT.        Assessment:   [name] is a 63 y.o. female admitted 02/28/2014. Presents w/ compromised functional mobility secondary recent CABG. Will see for PT to increase independence w/ functional mobility    Therapy Diagnosis: impaired functional mobility    Rehabilitation Potential: good    Treatment Activities: re-eval, sternal precautions, sit/stand  Educated the patient to role of physical therapy, plan of care, goals of therapy and safety with mobility and ADLs, energy conservation techniques, sternal precautions.    Plan:   PT Frequency: 3-4x/wk    Treatment/Interventions: gt,balance, ex, transfers, bed mobility    Risks/benefits/POC discussed pt       Reason for reassessment: Pt is s/p CABG x 2 on 03-04-14    Precautions and Contraindications:   Falls, sternal , R foot wound    Consult received for [name] for PT Evaluation and Treatment.  Patient's medical condition is appropriate for Physical Therapy intervention at this time.      History of Present Illness:   Updated History of Present Illness: [name] is a 63 y.o. female  Was admitted on 02/28/2014 with NSTEMI, CKD, COPD. Work-up for CABG. Is s/p CABG x 2 on 03-04-14.    Admitting Diagnosis: NSTEMI (non-ST elevated myocardial infarction) [I21.4]  NSTEMI (non-ST elevated myocardial infarction) [I21.4]    Updated imaging, tests, labs:   IMPRESSION: CXR 03-06-14  Small effusions and bibasilar atelectasis    Subjective:   Patient is agreeable to participation in the therapy session. Nursing clears patient  for therapy.     Patient Goal: none stated    Pain:   Scale: not rate  Location: sx site and R foot wound  Intervention: RN in w/ pain meds    Objective:   Patient is seated in a cardiac chair with telemetry/ iv/ chest tube in place. R foot w/ dressing/ace wrap     Cognitive Status and Neuro Exam:  Awake, follows simple commands    Musculoskeletal Examination  RUE ROM: wfl w/ sternal precautions  LUE ROM: wfl w/ sternal precautions  RLE ROM: wfl  LLE ROM: wfl    RUE Strength: 3+/5 w/ sternal precautions  LUE Strength: 3+/5 w/ sternal precautions  RLE Strength: 3+/5  LLE Strength: 3+/5    Functional Mobility  Rolling: nt  Supine to Sit: nt  Scooting: min  Sit to Stand: mod x 2  Stand to Sit: min/mod x 2  Transfers: defers secondary pain    Ambulation  Level of assistance required: mod x 2  Ambulation Distance: unable to take step secondary pain  Pattern: decrease wt bearing R LE  Device Used: hha  Weightbearing Status:wbat      Balance  Static Sitting: good  Dynamic Sitting: good  Static Standing: mod x 2  Dynamic Standing: mod x 2    Participation and Activity Tolerance  Participation Effort: good  Endurance: limited by  pain    Patient left with call bell within reach, all needs met, SCDs on  LLE, fall mat in place, chair alarm on and all questions answered. RN notified of session outcome and patient response.     Goals:  Goals  Time for Goal Acheivement: 7 visits  Pt Will Go Supine To Sit: with contact guard assist  Pt Will Perform Sit To Supine: with contact guard assist  Pt Will Transfer Bed/Chair: with minimal assist  Pt Will Ambulate:  (w/ mod x 2 100 ft)      [name] PT# [id number]    Time of Treatment:  PT Received On: 03/07/14  Start Time: 1205  Stop Time: 1235  Time Calculation (min): 30 min

## 2014-03-07 NOTE — Progress Notes (Signed)
CV SURGERY SDU PROGRESS NOTE      POD: 3    Surgery:  1. CABG x 2      EF: 45%    Surgeon: Austin Miles      Assessment/Plan:     Active Problems:    NSTEMI (non-ST elevated myocardial infarction)    Hypertensive urgency    PAD (peripheral artery disease)    Arterial leg ulcer    Chronic obstructive pulmonary disease, unspecified COPD type    Ischemic cardiomyopathy    CAD (coronary artery disease)    Diabetes    Chronic kidney disease      Neurologic/Psych:    History of chronic pain - continue Neurontin    History of anxiety - continue Effexor   Neurologically in tact    Cardiovascular:   CAD - s/p CABG.  History of PCI to RCA and Cx.  OM bypassed.   Moderate AS - medical management   HTN - BP 120s-150s, allowing to  on Coreg and hydralazine as needed.  No ACE yet secondary to AKI and BP 170s this AM.  Started PO hydralazine.   HLD - continue statin   PAD - known right iliac artery stenosis and right foot ulcer, with history of angioplasty to that leg.  Will need vascular consult before discharge, Dr Vanetta Mulders called today   Continue Amiodarone for AF prophylaxis.   Remains in SR 70s.     Core Measures: Aspirin - yes; Beta blocker - yes; Statin - yes, ACE - N/A secondary to AKI   Pacing wires: in    Drips: insulin    Pulmonary :   COPD - pulmonary follows.  Continue Flovent and Duonebs   Follow up as out patient   Stable on nasal cannula.     Chest tubes (past 12 hours): 150cc in 3 hrs this am, reevaluate later   Chest X-ray: stable, BL atelectasis, no ptx    Gastrointestinal:    tolerating po diet   No BM yet.     GI prophylaxis: Pepcid    Renal/GU:   CKD - base creatinine of 1.3-1.5.     AKI after surgery - creatinine as high as 2.4 down to 2.2 today    Renal following   Continue IVF with NAHCO3 at 40/hour until tomorrow per their recommendations.   Prn diuresis, will hold off on diuresis for now   Mild hyperkalemia - improved 4.9   Creatinine 2.2, I/O past 24 hours: -339cc   (overall  5.8 liters positive)   Will hold off on diureses for now   Foley: out    Hematological:   Anemia - multifactorial, HCT stable 8/28     Continue ASA for CAD.     Start SC heparin this AM   Hematocrit: 28, Platelets: 183   DVT prophylaxis: SCDs and SC heparin    Infectious Disease:   Preop UTI - treated.    RLE wound - wound care following.  Follows with an outpatient wound clinic. No current antibiotics.  Scanned preop and not osteo.    WBC: 7   Lines: PIV    Endocrinological:   Diabetes -on lantus   Endocrine following.   HgbA1c: 7.5    Plan:   Will need eventual rehab   Continue to monitor Cr closely   Continue IVF until tomorrow   Vascular called regarding right iliac stenosis   Wound care following foot ulcer   CT draining   PRN lasix at this time  Keep BP on the higher side       Subjective:   OOB to chair, no c/o's, having some pain at CT site      Objective:   Vital signs for last 24 hours:  Temp:  [97.4 F (36.3 C)-98.4 F (36.9 C)] 98.4 F (36.9 C)  Heart Rate:  [70-79] 74  Resp Rate:  [10-18] 18  BP: (137-164)/(63-72) 164/70 mmHg    Neuro: intact  Cardiac: RRR  Lungs: diminished BL, poor inspiratory effort  Abdomen: soft, NT, positive BS  Extremities: warm, no edema, RLE wrapped secondary to wound  Incisions: dressing dry and intact, + CT, + PW      Medications:   Scheduled Meds:  Current Facility-Administered Medications   Medication Dose Route Frequency   . albuterol-ipratropium  3 mL Nebulization Q6H SCH   . amiodarone  200 mg Oral Q12H SCH   . aspirin  325 mg Oral Daily   . atorvastatin  10 mg Oral QHS   . cadexomer iodine   Topical Daily   . carvedilol  6.25 mg Oral Q12H SCH   . fluticasone  2 puff Inhalation BID   . gabapentin  600 mg Oral Q12H SCH   . heparin (porcine)  5,000 Units Subcutaneous Q12H [facility]   . hydrALAZINE  25 mg Oral Q8H SCH   . [START ON 03/08/2014] insulin aspart  4 Units Subcutaneous QAM W/BREAKFAST   . insulin aspart  4 Units Subcutaneous Daily with lunch    . insulin aspart  4 Units Subcutaneous Daily with dinner   . insulin glargine  12 Units Subcutaneous Q12H SCH   . iron sucrose  200 mg Intravenous Q24H SCH   . mupirocin   Nasal Q12H SCH   . polyethylene glycol  17 g Oral Daily   . senna-docusate  1 tablet Oral QHS   . venlafaxine  75 mg Oral Q12H SCH     Continuous Infusions:  . insulin (regular) infusion Stopped (03/07/14 1207)   . custom IV infusion builder Stopped (03/07/14 1208)         Labs:   CBC:   Recent Labs      03/07/14   0437   WBC  7.57   HEMOGLOBIN  8.3*   HEMATOCRIT  28.7*   PLATELETS  183     BMP:   Recent Labs      03/07/14   0437   SODIUM  138   POTASSIUM  4.9   CHLORIDE  109   CO2  21*   BUN  38.0*   CREATININE  2.2*   GLUCOSE  110*   MAGNESIUM  2.5   CALCIUM  8.2*   PHOSPHORUS  4.0     Donny Heffern E Shaylon Gillean, PA-C

## 2014-03-07 NOTE — OT Eval Note (Signed)
[facility]   Occupational Therapy Evaluation     Patient: [name]    MRN#: [id number]   [facility]  Bed: FI254/FI254-01                                     Discharge Recommendations:   Discharge Recommendation: Acute Rehab   DME Recommended for Discharge: TBA    If Acute Rehab recommended discharge disposition is not available, patient will need hands on, min-mod assist for ADLs, functional mobility, BSC, shower chair, RW, w/c, raised toilet seat equipment, and home OT.     Assessment:   [name] is a 64 y.o. female admitted 02/28/2014 s/p CABG x 2. Provided pt with sternal precautions handout and educated on performance of ADLs- verbalize and demonstrate understanding. Pt presents with decreased independence with ADLs, decreased functional mobility, and decreased activity tolerance. Would benefit from skilled OT intervention to increase safety and independence with ADLs.     Therapy Diagnosis: decreased independence with ADLs    Rehabilitation Potential: good    Treatment Activities: OT initial eval, education on sternal precautions transfer training    Educated the patient to role of occupational therapy, plan of care, goals of therapy and safety with mobility and ADLs, energy conservation techniques, pursed lip breathing, sternal precautions, discharge instructions, home safety.    Plan:   OT Frequency Recommended: 2-3x/wk     Treatment/Interventions: ADL retraining, transfer training, therex, theract, pt/family education    Risks/benefits/POC discussed yes       Precautions and Contraindications:   Falls  Sternal  R foot wound    Consult received for [name] for OT Evaluation and Treatment.  Patient's medical condition is appropriate for Occupational Therapy intervention at this time.      History of Present Illness:    [name] is a 63 y.o. female admitted on 02/28/2014 s/p CABG x 2.    Admitting Diagnosis: NSTEMI (non-ST elevated myocardial infarction)  [I21.4]  NSTEMI (non-ST elevated myocardial infarction) [I21.4]    Past Medical/Surgical History:  DM, MI, CKD, HTN, HLD, PAD, CAD, anemia, arterial leg ulcer, COPD    Imaging/Tests/Labs:  12/20 Chest XRAY  IMPRESSION: 1. Incompletely occlusive venous thrombus in a left peroneal vein not  significantly changed from the prior study.  2. No evidence of deep vein thrombosis involving the right lower  extremity or left thigh..     Social History:   Prior Level of Function: modified independent with ADLs  Assistive Devices: RW  Baseline Activity: household ambulator with RW  DME Currently at Home: RW  Home Living Arrangements: lives with daughters  Type of Home: townhouse  Home Layout: steps to enter, tub/shower    Subjective: "My foot hurts and my chest incision hurt."    Patient is agreeable to participation in the therapy session. Nursing clears patient for therapy.     Patient Goal: to get better    Pain:   Scale: not rated  Location: RLE and chest incision  Intervention: RN aware, in room with pain meds    Objective:   Patient is seated in a cardiac chair with telemetry, IV access, dressing to RLE, CT to suction in place.      Cognitive Status and Neuro Exam:  Alert and oriented. Follows directions.    Musculoskeletal Examination  RUE ROM: WFL within sternal precautions  LUE  ROM: WFL within sternal precautions  RLE ROM: WFL  LLE ROM: WFL    RUE Strength: 3+/5 within sternal precautions  LUE Strength: 3+/5 within sternal precautions  RLE Strength: 3+/5  LLE Strength: 4/5      Sensory/Oculomotor Examination  Auditor: intact  Tactile: intact  Vision: intact      Activities of Daily Living  Eating: independent hand to mouth  Grooming: supervision with setup while seated  Bathing: NT  UE Dressing: min A  LE Dressing: min A to don/doff socks while seated, mod A to pull up pants in standing  Toileting: BSC    Functional Mobility:  Supine to Sit: NT- pt in chair  Scooting: min A  Sit to Stand: mod A x 2  Transfers: NT- pt  declines secondary to pain     Balance  Static Sitting: supervision  Dynamic Sitting: supervision  Static Standing: mod A  Dynamic Standing: mod A x 2    Participation and Activity Tolerance  Participation Effort: good  Endurance: fair, limited by pain    Patient left with call bell within reach, all needs met, SCDs on LLE- dressing and wound on RLE, fall mat in place, chair alarm in place and all questions answered. RN notified of session outcome and patient response.       Goals:  Time For Goal Achievement: 5 visits  ADL Goals  Patient will groom self: Supervision, at sinkside, 5 visits  Patient will dress upper body: Supervision, 5 visits  Patient will dress lower body: Supervision, 5 visits  Mobility and Transfer Goals  Pt will perform functional transfers: Stand by Assist, with rolling walker, 5 visits        Executive Fucntion Goals  Pt will follow sternal precautions: independent, to increase ability to complete ADLs, 5 visits                Jacalyn Lefevre, MS, OTR/L  Pager [phone]      Time of treatment:   OT Received On: 03/07/14  Start Time: 1140  Stop Time: 1210  Time Calculation (min): 30 min

## 2014-03-07 NOTE — Plan of Care (Signed)
Patient A&Ox3, NSR in 70s, O2 sats in mid 90s on RA.  BP in 150-160s.  Pacer wires intact and grounded, 2 CTs to suction with no air leak.  Patient is voiding using bedside commode, no BM but passing gas.  OOB with 2-person assist and only ambulates to commode due to RLE wound.  Worked with PT/OT today.  Patient's insulin drip [place]'d per protocol, on accuchecks and covered with sliding scale. Patient c/o incisional pain, refused percocet in morning, and pain being managed with Tramadol Q4 hours.  Performed wound care on RLE, treated wound with Cadexomer Iodine lotion, rebandaged foot with telfa and gauze, and wrapped with ACE bandage.  Patient tolerated procedure well.  SCDs on, chair and bed alarms on, fall mats down, call bell within reach.

## 2014-03-07 NOTE — Consults (Signed)
ENDOCRINE NEW CONSULT    Date Time: 03/07/2014 10:21 AM  Patient Name: [name]  Requesting Physician: Delila Spence, MD  Consulting Physician: Heber Carolina, MD    Primary Care Physician: Karl Ito, MD    Reason for Consultation: management of diabetes      Assessment/Plan:     1. DM type 2: HgbA1c 7.5%, diagnosed 4 years ago, managed by PMD. Home regimen include lantus 12 units at bedtime and humalog sliding scale starting with 3 units when above 150 mg/dL. Patient states she did not tolerate oral tablets and was transitioned to insulin 2.5 years ago - diarrhea and constipation with metformin. Glucose levels usually in the 120s in the mornings. She does admit to hypoglycemia awareness, but has not experienced it in a while. Denies weight changes over the past 6 months. Reports blurry vision, last seen 2 years ago, due for screening - denies worsening. Denies symptoms of neuropathy.      - Glucose 130, 128 so far today - on insulin drip, to be discontinued this morning   - Increased lantus to 12 units q12 hours   - Start novolog 4/4/4 - hold if NPO   - Start low dose novolog SSI qac PRN   - Will monitor and adjust insulin as needed   - Discharge recommendations pending hospital course -- will see if home regimen will need to be modified. Will discuss follow up with PMD, referral to endocrinologist would benefit, will discuss with patient.    - Her TSH is normal, and patient is clinically euthyroid.   - Please call [phone] or page ID [phone] or Tiger Text, or contact Dr. Eather Colas, Surinder P, MD, thank you for this consultation.  We will follow the patient with you during this hospitalization.  Please contact me with any questions or issues.    Total time of encounter was approximately 60 min > 50% time was spent at the bedside counseling & coordination of care about above needs.  [name], MD  [facility]  [phone] or Tiger Text or pager ID: [phone]   Hours  Mon. - Friday 8- 5:30pm      History of Presenting Illness:   [name] is a 63 y.o. female with h/o DM type 2, HTN, PAD, CAD s/p RCA stent in 03/2013, CKD, COPD, that was transferred from [facility] 02/28/14 after she presented on 12/12 with syncope and elevated troponin, found to have multivessel CAD. Patient is now POD 3 s/p 2V CABG. Consult for diabetes management postoperatively. HgbA1c 7.5%, managed by PMD. Diagnosed 4 years ago and started insulin therapy 2.5 years ago, stopped oral tablets due to diarrhea and constipation. Prior to admission, the patient was taking lantus 12 units at bedtime and humalog sliding scale before each meal, starting with 3 units when above 150 mg/dL. Glucose levels are usually in the 120s in the morning, per patient. The patient denies weight changes over the past year, reports blurry vision that has not worsened but is due for a follow up, denies symptoms of neuropathy of the extremities. The patient is currently in no acute distress, with mild incisional chest pain relieved with pain medication. Denies shortness of breath, nausea, vomiting, abdominal pain. Her appetite is fair, consuming at least 50% of her tray.     Past Medical History:     Past Medical History   Diagnosis Date   . Diabetes mellitus    .  Chronic kidney disease    . Myocardial infarction    . Hypertension    . Hyperlipidemia    . Peripheral arterial disease      06/28/13 right superficial femoral artery stenosis, tibial peroneal trunk stenosis, left CVL a stenosis, treated with angiography and angioplasty.   . Coronary artery disease      Left circumflex artery presumed DES.,  Chronically occluded right coronary artery stent   . Anemia      She redid to chronic disease and iron deficiency.     . Arterial leg ulcer      2014, receiving home health   . Chronic obstructive pulmonary disease      Previously on Spiriva and Advair     Available old records reviewed, including:  Epic notes    Past Surgical  History:     Past Surgical History   Procedure Laterality Date   . Cardiac angiography and angioplasty  06/2013   . Colonoscopy  Unknown   . Tubal ligation     . Coronary artery bypass N/A 03/04/2014     Procedure: CORONARY ARTERY BYPASS;  Surgeon: Austin Miles, MD;  Location: [facility] HEART OR;  Service: Cardiovascular;  Laterality: N/A;  LIMA to LAD  SVG to OM   . Endoscopic,vein harvest Left 03/04/2014     Procedure: ENDOSCOPIC,VEIN HARVEST;  Surgeon: Austin Miles, MD;  Location: [facility] HEART OR;  Service: Cardiovascular;  Laterality: Left;  By D. Lazarus Gowda, RNFA. Left groin to mid-calf.   Rhae Hammock N/A 03/04/2014     Procedure: TEE;  Surgeon: Austin Miles, MD;  Location: [facility] HEART OR;  Service: Cardiovascular;  Laterality: N/A;  probe [id number]       Family History:     Family History   Problem Relation Age of Onset   . Coronary artery disease Mother      Died at 62 from MI.   Marland Kitchen Coronary artery disease Father      Died at 21 from MI   . Diabetes type II       Mother and father       Social History:     History   Smoking status   . Former Smoker -- 1.00 packs/day for 20 years   . Quit date: 02/29/2012   Smokeless tobacco   . Not on file     History   Alcohol Use: Not on file     History   Drug Use Not on file       Allergies:     Allergies   Allergen Reactions   . Penicillins Anaphylaxis and Angioedema     Tolerates cephalosporins        Medications:     Prescriptions prior to admission   Medication Sig   . aspirin 81 MG chewable tablet Chew 81 mg by mouth daily.   Marland Kitchen atorvastatin (LIPITOR) 10 MG tablet Take 10 mg by mouth nightly.   . carvedilol (COREG) 3.125 MG tablet Take 3.125 mg by mouth 2 (two) times daily with meals.   . furosemide (LASIX) 20 MG tablet Take 20 mg by mouth daily.      Marland Kitchen gabapentin (NEURONTIN) 300 MG capsule Take 600 mg by mouth 3 (three) times daily.      . hydrALAZINE (APRESOLINE) 50 MG tablet Take 50 mg by mouth 2 (two) times daily.   . insulin lispro (HUMALOG) 100 UNIT/ML injection Inject  into the skin 4 times daily - with meals and at bedtime.   Marland Kitchen  lisinopril (PRINIVIL,ZESTRIL) 5 MG tablet Take 2.5 mg by mouth daily.   . naproxen (NAPROSYN) 250 MG tablet Take 250 mg by mouth daily.   Marland Kitchen venlafaxine (EFFEXOR) 75 MG tablet Take 75 mg by mouth 2 (two) times daily.       Current Facility-Administered Medications   Medication Dose Route Frequency   . albuterol-ipratropium  3 mL Nebulization Q6H SCH   . amiodarone  200 mg Oral Q12H SCH   . aspirin  325 mg Oral Daily   . atorvastatin  10 mg Oral QHS   . cadexomer iodine   Topical Daily   . carvedilol  6.25 mg Oral Q12H SCH   . fluticasone  2 puff Inhalation BID   . gabapentin  600 mg Oral Q12H SCH   . heparin (porcine)  5,000 Units Subcutaneous Q12H [facility]   . hydrALAZINE  25 mg Oral Q8H SCH   . [START ON 03/08/2014] insulin aspart  4 Units Subcutaneous QAM W/BREAKFAST   . insulin aspart  4 Units Subcutaneous Daily with lunch   . insulin aspart  4 Units Subcutaneous Daily with dinner   . insulin glargine  12 Units Subcutaneous Q12H SCH   . iron sucrose  200 mg Intravenous Q24H SCH   . mupirocin   Nasal Q12H SCH   . polyethylene glycol  17 g Oral Daily   . senna-docusate  1 tablet Oral QHS   . venlafaxine  75 mg Oral Q12H SCH        Review of Systems:   All other systems were reviewed and are negative except incisional chest pain    Physical Exam:   Patient Vitals for the past 24 hrs:   BP Temp Temp src Pulse Resp SpO2 Weight   03/07/14 0940 - - - 77 18 99 % -   03/07/14 0800 - 98.3 F (36.8 C) Oral - - - -   03/07/14 0516 - - - - - - 77.248 kg (170 lb 4.8 oz)   03/07/14 0500 152/68 mmHg - - - - - -   03/07/14 0438 160/72 mmHg 98.2 F (36.8 C) Oral 75 17 100 % -   03/06/14 2349 152/68 mmHg 98 F (36.7 C) Oral 70 17 95 % -   03/06/14 2015 152/67 mmHg 97.9 F (36.6 C) Oral 79 - 97 % -   03/06/14 1851 145/65 mmHg 97.9 F (36.6 C) Oral 74 17 96 % -   03/06/14 1654 153/71 mmHg 97.4 F (36.3 C) Oral 76 14 93 % -   03/06/14 1600 137/63 mmHg 98.2 F (36.8 C) - 71  (!) 10 93 % -   03/06/14 1500 139/62 mmHg - - 75 18 - -   03/06/14 1433 114/63 mmHg - - 72 18 96 % -   03/06/14 1400 130/61 mmHg - - 72 21 94 % -   03/06/14 1330 142/63 mmHg - - 72 13 98 % -   03/06/14 1300 (!) 132/100 mmHg - - 77 (!) 29 99 % -   03/06/14 1230 145/90 mmHg 97.9 F (36.6 C) Oral 75 18 100 % -   03/06/14 1100 122/53 mmHg - - 73 13 100 % -     Body mass index is 31.66 kg/(m^2).    Intake/Output Summary (Last 24 hours) at 03/07/14 1021  Last data filed at 03/07/14 0600   Gross per 24 hour   Intake 1219.03 ml   Output   1395 ml   Net -175.97 ml  General: WDWN AAM who is awake, alert, oriented x 3; no acute distress.  HEENT: perrla, eomi, sclera anicteric  oropharynx clear without lesions, mucous membranes moist  Neck: supple, no lymphadenopathy, no thyromegaly, no JVD  Cardiovascular: regular rate and rhythm, no murmurs, rubs or gallops  Lungs: few crackles at bases, no wheezes  Abdomen: soft, non-tender, non-distended; no palpable masses, no hepatosplenomegaly, no rebound or guarding; minimal BS  Extremities: no clubbing, cyanosis, no edema; dressing over right lower leg  Neuro: A+O x 3, cranial nerves grossly intact, strength 5/5 in upper and lower extremities, sensation intact  Skin: no rashes or lesions noted      Labs:     POCT - GLUCOSE WHOLE BLOOD   Date/Time Value Ref Range Status   03/07/2014 0849 128* 70 - 100 mg/dL Final   16/12/9602 5409 130* 70 - 100 mg/dL Final   81/19/1478 2956 121* 70 - 100 mg/dL Final   21/30/8657 8469 102* 70 - 100 mg/dL Final   62/95/2841 3244 112* 70 - 100 mg/dL Final       HEMOGLOBIN A1C (%)   Date Value   03/03/2014 7.5*     Lab Results   Component Value Date    TSH 1.08 03/07/2014     Recent Labs      03/07/14   0437  03/06/14   0121   WBC  7.57  12.97*   HEMOGLOBIN  8.3*  8.6*   HEMATOCRIT  28.7*  29.2*   PLATELETS  183  181       Recent Labs      03/07/14   0437  03/06/14   1442   03/06/14   0121   SODIUM  138  138   < >  137   POTASSIUM  4.9  5.0   < >   5.3*   CHLORIDE  109  110   < >  110   CO2  21*  21*   < >  18*   BUN  38.0*  36.0*   < >  32.0*   CREATININE  2.2*  2.4*   < >  2.2*   GLUCOSE  110*  157*   < >  142*   CALCIUM  8.2*  8.3*   < >  8.7   MAGNESIUM  2.5   --    --   2.7*   PHOSPHORUS  4.0   --    --    --     < > = values in this interval not displayed.       Recent Labs      03/05/14   0150   AST (SGOT)  30   ALT  14   ALKALINE PHOSPHATASE  47   PROTEIN, TOTAL  6.0   ALBUMIN  3.0*       Recent Labs      03/04/14   1921  03/04/14   1119   PTT  47*  75*   PT  15.5*   --    PT INR  1.2*   --      Case discussed with and patient also seen by: Dr. Leotis Shames      Signed by: Mickeal Skinner, PA-C    cc: Delila Spence, MD  Karl Ito, MD

## 2014-03-07 NOTE — Progress Notes (Signed)
Pt.AOx3,SR on tele,needs 2 person assist with activity,R foot dressing CDI,CTx2 to 1 atrium,suction,no air leak,MS LLE incisions OTA,voiding freely,passing gas,no BM,wires grounded,awaiting for vascular consult,labs in am.Will continue plan of care.

## 2014-03-07 NOTE — Progress Notes (Signed)
[facility] [employer].  Progress Note    Assessment:    -AKI likely due to hemodynamics, CABG, and sue of IV contrast at OSH;  serum creatinine stable and gradually improving to 2.2 mg/dL; metabolically stable   -HTN with CKD  -Anemia with CKD  -CKD III;  -3 vessels CAD; s/p cath and CABG     Plan:    -continue low rate bicarb-containing solution; can stop in am   -ESA  -supportive care   -avoid nephrotoxins     Madison Medin, MD  Office - [phone]  Spectra Link - [phone]  ++++++++++++++++++++++++++++++++++++++++++++++++++++++++++++++  Subjective:  No new complaints    Medications:  Scheduled Meds:  Current Facility-Administered Medications   Medication Dose Route Frequency   . albuterol-ipratropium  3 mL Nebulization Q6H SCH   . amiodarone  200 mg Oral Q12H SCH   . aspirin  325 mg Oral Daily   . atorvastatin  10 mg Oral QHS   . cadexomer iodine   Topical Daily   . carvedilol  6.25 mg Oral Q12H SCH   . fluticasone  2 puff Inhalation BID   . gabapentin  600 mg Oral Q12H SCH   . heparin (porcine)  5,000 Units Subcutaneous Q12H [facility]   . hydrALAZINE  25 mg Oral Q8H SCH   . insulin glargine  15 Units Subcutaneous QHS   . iron sucrose  200 mg Intravenous Q24H SCH   . mupirocin   Nasal Q12H SCH   . polyethylene glycol  17 g Oral Daily   . senna-docusate  1 tablet Oral QHS   . venlafaxine  75 mg Oral Q12H SCH     Continuous Infusions:  . insulin (regular) infusion 0.7 Units/hr (03/07/14 9147)   . custom IV infusion builder 40 mL/hr at 03/06/14 1307     PRN Meds:acetaminophen, bisacodyl, dextrose, ondansetron, oxyCODONE-acetaminophen, traMADol    Objective:  Vital signs in last 24 hours:  Temp:  [97.4 F (36.3 C)-98.3 F (36.8 C)] 98.3 F (36.8 C)  Heart Rate:  [70-79] 75  Resp Rate:  [10-29] 17  BP: (114-160)/(53-100) 152/68 mmHg  Intake/Output last 24 hours:    Intake/Output Summary (Last 24 hours) at 03/07/14 0929  Last data filed at 03/07/14 0600   Gross per 24 hour   Intake 1315.23 ml    Output   1440 ml   Net -124.77 ml     Intake/Output this shift:       Physical Exam:   Gen: WD WN NAD   CV: S1 S2 N RRR   Chest: CTAB   Ab: ND NT soft no HSM +BS   Ext: No C/E    Labs:    Recent Labs  Lab 03/07/14  0437 03/06/14  1442 03/06/14  0952 03/06/14  0121  03/05/14  0150  03/04/14  0336 03/03/14  0118  02/28/14  1728   GLUCOSE 110* 157* 185* 142*  < > 112*  < > 122* 129*  < > 138*   BUN 38.0* 36.0* 33.0* 32.0*  < > 25.0*  < > 26.0* 23.0*  < > 24.0*   CREATININE 2.2* 2.4* 2.3* 2.2*  < > 1.8*  < > 1.7* 1.4*  < > 1.3*   CALCIUM 8.2* 8.3* 8.4* 8.7  < > 8.5  < > 8.1* 8.2*  < > 8.8   SODIUM 138 138 138 137  < > 137  < > 137 136  < > 138   POTASSIUM 4.9  5.0 4.9 5.3*  < > 4.2  < > 4.4 4.6  < > 4.8   CHLORIDE 109 110 109 110  < > 108  < > 106 106  < > 108   CO2 21* 21* 19* 18*  < > 23  < > 28 23  < > 23   ALBUMIN  --   --   --   --   --  3.0*  --   --   --   --  2.7*   PHOSPHORUS 4.0  --   --   --   --   --   --  3.6 3.5  < > 2.7   MAGNESIUM 2.5  --   --  2.7*  --  2.7*  < > 2.1 2.3  < > 1.9   < > = values in this interval not displayed.    Recent Labs  Lab 03/07/14  0437 03/06/14  0121 03/05/14  0150   WBC 7.57 12.97* 11.40*   HEMOGLOBIN 8.3* 8.6* 8.6*   HEMATOCRIT 28.7* 29.2* 28.4*   MCV 90.0 88.0 85.3   MCH, POC 26.0* 25.9* 25.8*   MCHC 28.9* 29.5* 30.3*   RDW 19* 19* 18*   MPV 11.0 10.1 9.5   PLATELETS 183 181 176

## 2014-03-07 NOTE — Progress Notes (Signed)
Referral placed for acute rehab.     Hermann Dottavio, MSW  Hospital Discharge Planner [facility]  [facility]  [phone]

## 2014-03-08 ENCOUNTER — Other Ambulatory Visit: Payer: BLUE CROSS/BLUE SHIELD

## 2014-03-08 LAB — BASIC METABOLIC PANEL
BUN: 39 mg/dL — ABNORMAL HIGH (ref 7.0–19.0)
CO2: 20 mEq/L — ABNORMAL LOW (ref 22–29)
Calcium: 8.1 mg/dL — ABNORMAL LOW (ref 8.5–10.5)
Chloride: 111 mEq/L (ref 100–111)
Creatinine: 1.9 mg/dL — ABNORMAL HIGH (ref 0.6–1.0)
Glucose: 92 mg/dL (ref 70–100)
Potassium: 4.7 mEq/L (ref 3.5–5.1)
Sodium: 139 mEq/L (ref 136–145)

## 2014-03-08 LAB — CBC
Hematocrit: 28.9 % — ABNORMAL LOW (ref 37.0–47.0)
Hgb: 8.5 g/dL — ABNORMAL LOW (ref 12.0–16.0)
MCH: 26.1 pg — ABNORMAL LOW (ref 28.0–32.0)
MCHC: 29.4 g/dL — ABNORMAL LOW (ref 32.0–36.0)
MCV: 88.7 fL (ref 80.0–100.0)
MPV: 10.3 fL (ref 9.4–12.3)
Nucleated RBC: 4 /100 WBC — ABNORMAL HIGH (ref 0–1)
Platelets: 170 10*3/uL (ref 140–400)
RBC: 3.26 10*6/uL — ABNORMAL LOW (ref 4.20–5.40)
RDW: 20 % — ABNORMAL HIGH (ref 12–15)
WBC: 7.35 10*3/uL (ref 3.50–10.80)

## 2014-03-08 LAB — PHOSPHORUS: Phosphorus: 2.6 mg/dL (ref 2.3–4.7)

## 2014-03-08 LAB — MAGNESIUM: Magnesium: 2.3 mg/dL (ref 1.6–2.6)

## 2014-03-08 LAB — GLUCOSE WHOLE BLOOD - POCT
Whole Blood Glucose POCT: 100 mg/dL (ref 70–100)
Whole Blood Glucose POCT: 75 mg/dL (ref 70–100)
Whole Blood Glucose POCT: 122 mg/dL — ABNORMAL HIGH (ref 70–100)
Whole Blood Glucose POCT: 83 mg/dL (ref 70–100)

## 2014-03-08 LAB — GFR: EGFR: 32.1

## 2014-03-08 MED ORDER — ALBUTEROL-IPRATROPIUM 2.5-0.5 (3) MG/3ML IN SOLN
3.00 mL | Freq: Four times a day (QID) | RESPIRATORY_TRACT | Status: DC | PRN
Start: 2014-03-08 — End: 2014-03-14

## 2014-03-08 MED ORDER — INSULIN GLARGINE 100 UNIT/ML SC SOLN
8.00 [IU] | Freq: Two times a day (BID) | SUBCUTANEOUS | Status: DC
Start: 2014-03-08 — End: 2014-03-08

## 2014-03-08 MED ORDER — INSULIN ASPART 100 UNIT/ML SC SOLN
3.00 [IU] | Freq: Every morning | SUBCUTANEOUS | Status: DC
Start: 2014-03-09 — End: 2014-03-09

## 2014-03-08 MED ORDER — SODIUM BICARBONATE 650 MG PO TABS
650.0000 mg | ORAL_TABLET | Freq: Three times a day (TID) | ORAL | Status: DC
Start: 2014-03-08 — End: 2014-03-14
  Administered 2014-03-08 – 2014-03-14 (×19): 650 mg via ORAL
  Filled 2014-03-08 (×23): qty 1

## 2014-03-08 MED ORDER — AMLODIPINE BESYLATE 5 MG PO TABS
5.0000 mg | ORAL_TABLET | Freq: Every day | ORAL | Status: DC
Start: 2014-03-08 — End: 2014-03-08
  Administered 2014-03-08: 5 mg via ORAL
  Filled 2014-03-08: qty 1

## 2014-03-08 MED ORDER — AMLODIPINE BESYLATE 5 MG PO TABS
10.0000 mg | ORAL_TABLET | Freq: Every day | ORAL | Status: DC
Start: 2014-03-09 — End: 2014-03-14
  Administered 2014-03-09 – 2014-03-14 (×6): 10 mg via ORAL
  Filled 2014-03-08 (×6): qty 2

## 2014-03-08 MED ORDER — INSULIN ASPART 100 UNIT/ML SC SOLN
3.00 [IU] | Freq: Every day | SUBCUTANEOUS | Status: DC
Start: 2014-03-08 — End: 2014-03-09

## 2014-03-08 MED ORDER — INSULIN GLARGINE 100 UNIT/ML SC SOLN
8.0000 [IU] | Freq: Two times a day (BID) | SUBCUTANEOUS | Status: DC
Start: 2014-03-08 — End: 2014-03-09
  Administered 2014-03-08 (×2): 8 [IU] via SUBCUTANEOUS
  Filled 2014-03-08: qty 8

## 2014-03-08 MED ORDER — CARVEDILOL 6.25 MG PO TABS
6.25 mg | ORAL_TABLET | Freq: Once | ORAL | Status: AC
Start: 2014-03-08 — End: 2014-03-08
  Administered 2014-03-08: 6.25 mg via ORAL

## 2014-03-08 MED ORDER — INSULIN ASPART 100 UNIT/ML SC SOLN
3.00 [IU] | Freq: Every day | SUBCUTANEOUS | Status: DC
Start: 2014-03-08 — End: 2014-03-09
  Administered 2014-03-08: 3 [IU] via SUBCUTANEOUS

## 2014-03-08 NOTE — Plan of Care (Signed)
Patient A&Ox3, NSR in 70s, O2 sats in mid 90s on RA. BP in 150-160s. Norvasc 10mg  daily started this shift.  Pacer wires removed this afternoon by PA Adam. 2 CTs to suction with no air leak. Patient is voiding using bedside commode, gave suppository in morning which allowed a small BM. OOB with 2-person assist and only ambulates to commode due to RLE wound. Worked with PT/OT today. Patient receiving Lantus and Novolog on schedule and covered for additional units with sliding scale. Pain being managed with Tylenol and percocet. Vascular consult done.  Possible angiography and stent.Treated RLE wound with Cadexomer Iodine lotion, rebandaged foot with telfa and gauze, and wrapped with ACE bandage. Patient tolerated procedure well. SCDs on, chair and bed alarms on, fall mats down, call bell within reach.

## 2014-03-08 NOTE — Progress Notes (Signed)
CV SURGERY SDU PROGRESS NOTE      POD: 4    Surgery:  1. CABG x 2      EF: 45%    Surgeon: Austin Miles      Assessment/Plan:     Active Problems:    NSTEMI (non-ST elevated myocardial infarction)    Hypertensive urgency    PAD (peripheral artery disease)    Arterial leg ulcer    Chronic obstructive pulmonary disease, unspecified COPD type    Ischemic cardiomyopathy    CAD (coronary artery disease)    Diabetes    Chronic kidney disease      Neurologic/Psych:    History of chronic pain - continue Neurontin    History of anxiety - continue Effexor   Neurologically in tact    Cardiovascular:   CAD - s/p CABG.  History of PCI to RCA and Cx.  OM bypassed.   Moderate AS - medical management   HTN - BP increasing through the night through today: 170-180.  Added Norvasc 5 mg.     HLD - continue statin   PAD - known right iliac artery stenosis and right foot ulcer, with history of angioplasty to that leg.  Dr. Vanetta Mulders consulted today.  For possible right iliac stent.  (Dr. Thedore Mins said patient may have stent placed, if Dr. Vanetta Mulders feels clinically indicated.)   Continue Amiodarone for AF prophylaxis.   Remains in SR 60-70s.     Core Measures: Aspirin - yes; Beta blocker - yes; Statin - yes, ACE - N/A secondary to AKI   Pacing wires: out    Pulmonary :   COPD - pulmonary follows.  Continue Flovent and Duonebs   Follow up as out patient   98% on room air.     Chest tubes (past 12 hours): 100 cc.  Serous drainage.  Will evaluate to discontinue later today.    Chest X-ray: stable, BL atelectasis, no ptx    Gastrointestinal:   Tolerating po diet   No BM yet.      Renal/GU:   CKD - base creatinine of 1.3-1.5.     AKI after surgery - creatinine as high as 2.4 down to 2.2 today   Renal following   Continue IVF with NAHCO3 at 40/hour until tomorrow per their recommendations.   Prn diuresis, will hold off on diuresis for now   Mild hyperkalemia - improved 4.7   Creatinine 1.9, I/O past 24 hours: + 1300cc    (overall 5 liters positive)   Foley: out    Hematological:   Anemia - multifactorial, HCT stable 8.5/28.9     Continue ASA for CAD.     Platelets: 170   DVT prophylaxis: SCDs and SC heparin    Infectious Disease:   Preop UTI - treated.    RLE wound - wound care following.  Follows with an outpatient wound clinic. No current antibiotics.  Scanned preop and not osteo.    WBC: 7.4, afebrile   Lines: PIV    Endocrinological:   Diabetes -on lantus   Endocrine following.   HgbA1c: 7.5    Plan:   Will need eventual rehab   Continue to monitor Cr closely (decreasing 1.9)   Vascular consulted - to evaluate for iliac stent.     Wound care following foot ulcer   CT draining   PRN lasix at this time   BP systolic 180's - added Norvasc 5 mg.        Subjective:  OOB to chair.  Doing well.        Objective:   Vital signs for last 24 hours:  Temp:  [97.8 F (36.6 C)-99.2 F (37.3 C)] 97.8 F (36.6 C)  Heart Rate:  [69-73] 71  Resp Rate:  [16-18] 16  BP: (147-184)/(65-88) 174/78 mmHg    Neuro: intact  Cardiac: RRR  Lungs: diminished BL, poor inspiratory effort  Abdomen: soft, NT, positive BS  Extremities: warm, no edema, RLE wrapped secondary to wound  Incisions: dressing dry and intact, + CT.  Sternum stable.       Medications:   Scheduled Meds:  Current Facility-Administered Medications   Medication Dose Route Frequency   . amiodarone  200 mg Oral Q12H SCH   . amLODIPine  5 mg Oral Daily   . aspirin  325 mg Oral Daily   . atorvastatin  10 mg Oral QHS   . cadexomer iodine   Topical Daily   . carvedilol  6.25 mg Oral Q12H SCH   . fluticasone  2 puff Inhalation BID   . gabapentin  600 mg Oral Q12H SCH   . heparin (porcine)  5,000 Units Subcutaneous Q12H [facility]   . hydrALAZINE  25 mg Oral Q8H SCH   . [START ON 03/09/2014] insulin aspart  3 Units Subcutaneous QAM W/BREAKFAST   . insulin aspart  3 Units Subcutaneous Daily with lunch   . insulin aspart  3 Units Subcutaneous Daily with dinner   . insulin glargine  8  Units Subcutaneous Q12H SCH   . mupirocin   Nasal Q12H SCH   . polyethylene glycol  17 g Oral Daily   . senna-docusate  1 tablet Oral QHS   . venlafaxine  75 mg Oral Q12H SCH     Continuous Infusions:  . insulin (regular) infusion Stopped (03/07/14 1207)   . custom IV infusion builder Stopped (03/07/14 1208)         Labs:   CBC:   Recent Labs      03/08/14   0358   WBC  7.35   HEMOGLOBIN  8.5*   HEMATOCRIT  28.9*   PLATELETS  170     BMP:   Recent Labs      03/08/14   0358   SODIUM  139   POTASSIUM  4.7   CHLORIDE  111   CO2  20*   BUN  39.0*   CREATININE  1.9*   GLUCOSE  92   MAGNESIUM  2.3   CALCIUM  8.1*   PHOSPHORUS  2.6     Inda Coke, PA-C

## 2014-03-08 NOTE — PT Progress Note (Signed)
[facility]   Physical Therapy Treatment  Patient:  [name] MRN#:  [id number]  [facility]  Bed: FI254/FI254-01    Discharge Recommendations:   D/C Recommendations: Acute Rehab   DME Recommendations: tbd    If Acute Rehab recommended discharge disposition is not available, patient will need mod assist for mobility, w/c for distances, bsc, hospital bed equipment, and continued skilled PT.        Assessment:   Pt w/ increasing tolerance to standing, but Limited tolerance to wt bearing R Foot w/ dynamic tasks. Progressing w/ bed mobility and transfers. Will see for continued PT to increase strength, improve balance/endurance for max functional independence w/ mobility.    Treatment Activities: bed mobility, transfers, ex, standing balance    Educated the patient to role of physical therapy, plan of care, goals of therapy and safety with mobility and ADLs, energy conservation techniques, sternal precautions.    Plan:   PT Frequency: 3-4x/wk    Continue plan of care.       Precautions and Contraindications:   Falls, sternal    Updated Medical Status/Imaging/Labs:   reviewed  Subjective:    I am feeling better  Patient's medical condition is appropriate for Physical Therapy intervention at this time.  Patient is agreeable to participation in the therapy session. Nursing clears patient for therapy.    Pain:   Scale: controlled  Location: R foot /sx site  Intervention: position of comfort/pain meds      Objective:   Patient is in bed with telemetry, iv , R foot w/ ace wrap/dressing in place.    Cognition  Awake, follows commands    Functional Mobility  Rolling: min  Supine to Sit: mod  Scooting: min  Sit to Stand: mod  Stand to Sit: min/mod  Transfers: mod    Ambulation  Level of Assistance required: mod  Ambulation Distance: 2-3 scooting type steps  Pattern: decrease wt bearing R foot, decrease balance  Device Used: therapist  Weightbearing Status: wbat    Balance:  Static Sitting:  supervision  Dynamic Sitting: cga  Static Standing: min  Dynamic Standing: mod    Therapeutic Exercises  Ap, qs, heel slides    Patient Participation: good  Patient Endurance: fair    Patient left with call bell within reach, all needs met, SCDs on LLE, fall mat in place, chair alarm on and all questions answered. RN notified of session outcome and patient response.     Goals:  Goals  Time for Goal Acheivement: 7 visits  Pt Will Go Supine To Sit: with contact guard assist  Pt Will Perform Sit To Supine: with contact guard assist  Pt Will Transfer Bed/Chair: with minimal assist  Pt Will Ambulate:  (w/ mod x 2 100 ft)      Georgeanne Nim PT# [id number]    Time of Treatment:  PT Received On: 03/08/14  Start Time: 1510  Stop Time: 1540  Time Calculation (min): 30 min  Treatment # 1 out of 7 visits    }

## 2014-03-08 NOTE — Progress Notes (Signed)
[facility], [employer].  Progress Note    Assessment:    -AKI likely due to hemodynamics, CABG, and sue of IV contrast at OSH; serum creatinine stable and gradually improving to 1.9 mg/dL; metabolically stable   -HTN with CKD; not controlled   -Anemia with CKD  -CKD III;  -3 vessels CAD; s/p cath and CABG     Plan:    -stop fluids   -start oral sodium bicarb at 650 mg tid   -Increase norvasc to 10 mg  -labs  -supportive care   -ESA    Madison Medin, MD  Office - [phone]  [facility] - [phone]  ++++++++++++++++++++++++++++++++++++++++++++++++++++++++++++++  Subjective:  No new complaints    Medications:  Scheduled Meds:  Current Facility-Administered Medications   Medication Dose Route Frequency   . amiodarone  200 mg Oral Q12H SCH   . amLODIPine  5 mg Oral Daily   . aspirin  325 mg Oral Daily   . atorvastatin  10 mg Oral QHS   . cadexomer iodine   Topical Daily   . carvedilol  6.25 mg Oral Q12H SCH   . fluticasone  2 puff Inhalation BID   . gabapentin  600 mg Oral Q12H SCH   . heparin (porcine)  5,000 Units Subcutaneous Q12H [facility]   . hydrALAZINE  25 mg Oral Q8H SCH   . [START ON 03/09/2014] insulin aspart  3 Units Subcutaneous QAM W/BREAKFAST   . insulin aspart  3 Units Subcutaneous Daily with lunch   . insulin aspart  3 Units Subcutaneous Daily with dinner   . insulin glargine  8 Units Subcutaneous Q12H SCH   . mupirocin   Nasal Q12H SCH   . polyethylene glycol  17 g Oral Daily   . senna-docusate  1 tablet Oral QHS   . venlafaxine  75 mg Oral Q12H SCH     Continuous Infusions:  . insulin (regular) infusion Stopped (03/07/14 1207)   . custom IV infusion builder Stopped (03/07/14 1208)     PRN Meds:acetaminophen, albuterol-ipratropium, bisacodyl, dextrose, dextrose, glucagon (rDNA), insulin aspart, ondansetron, oxyCODONE-acetaminophen, traMADol    Objective:  Vital signs in last 24 hours:  Temp:  [97.8 F (36.6 C)-99.2 F (37.3 C)] 97.8 F (36.6 C)  Heart Rate:  [69-73] 71  Resp  Rate:  [16-18] 16  BP: (147-184)/(65-88) 174/78 mmHg  Intake/Output last 24 hours:    Intake/Output Summary (Last 24 hours) at 03/08/14 1324  Last data filed at 03/08/14 0300   Gross per 24 hour   Intake    600 ml   Output   1020 ml   Net   -420 ml     Intake/Output this shift:       Physical Exam:   Gen:NAD   CV: S1 S2    Chest: CTAB   Ab: ND NT   Ext: No C/E    Labs:    Recent Labs  Lab 03/08/14  0358 03/07/14  0437 03/06/14  1442  03/06/14  0121  03/05/14  0150  03/04/14  0336   GLUCOSE 92 110* 157*  < > 142*  < > 112*  < > 122*   BUN 39.0* 38.0* 36.0*  < > 32.0*  < > 25.0*  < > 26.0*   CREATININE 1.9* 2.2* 2.4*  < > 2.2*  < > 1.8*  < > 1.7*   CALCIUM 8.1* 8.2* 8.3*  < > 8.7  < > 8.5  < > 8.1*  SODIUM 139 138 138  < > 137  < > 137  < > 137   POTASSIUM 4.7 4.9 5.0  < > 5.3*  < > 4.2  < > 4.4   CHLORIDE 111 109 110  < > 110  < > 108  < > 106   CO2 20* 21* 21*  < > 18*  < > 23  < > 28   ALBUMIN  --   --   --   --   --   --  3.0*  --   --    PHOSPHORUS 2.6 4.0  --   --   --   --   --   --  3.6   MAGNESIUM 2.3 2.5  --   --  2.7*  --  2.7*  < > 2.1   < > = values in this interval not displayed.    Recent Labs  Lab 03/08/14  0358 03/07/14  0437 03/06/14  0121   WBC 7.35 7.57 12.97*   HEMOGLOBIN 8.5* 8.3* 8.6*   HEMATOCRIT 28.9* 28.7* 29.2*   MCV 88.7 90.0 88.0   MCH, POC 26.1* 26.0* 25.9*   MCHC 29.4* 28.9* 29.5*   RDW 20* 19* 19*   MPV 10.3 11.0 10.1   PLATELETS 170 183 181

## 2014-03-08 NOTE — Progress Notes (Signed)
Patients duoneb changed to prn per respiratory pdp.

## 2014-03-08 NOTE — Progress Notes (Signed)
ENDOCRINE PROGRESS NOTE    Date Time: 03/08/2014 10:53 PM  Patient Name: [name]  Attending Physician: Delila Spence, MD  Consulting Physician: Heber Carolina, MD      CC: management of DM type 2    Assessment/Plan:     1. DM type 2: HgbA1c 7.5%, diagnosed 4 years ago, managed by PMD. Home regimen includes lantus 12 units at bedtime and humalog sliding scale starting with 3 units when above 150 mg/dL. Patient states she did not tolerate oral tablets and was transitioned to insulin 2.5 years ago -- had diarrhea and constipation with metformin. Glucose levels usually in the 120s in the mornings. She does admit to hypoglycemia awareness but has not experienced it in a while. Denies weight changes over the past 6 months. Reports blurry vision, last seen 2 years ago, is past due for screening - denies worsening. Denies symptoms of neuropathy.      - Glucose 75, 122 so far today -- with reduced po intake, NPO this morning for possible angiogram - this was cancelled   - Reduced lantus to 8 units q12 hours   - Reduced novolog to 3/3/3 - hold if NPO and hold if glucose <100   - Continue low dose novolog SSI qac PRN   - Discharge recommendations pending insulin requirements during hospital course. Will need f/u with PMD upon discharge.   - Please call x [phone] or pager ID [phone] or Tiger Text, or contact Dr. Leotis Shames    Total time spent, of which at least 50% of time spent with patient discussing diabetes education, diet, current insulin regimen, management during hospital course, coordination of care: 30 minutes      HPI/Subjective: 63 yo woman with h/o DM type 2, HTN, PAD, CAD s/p RCA stent in 03/2013, CKD, COPD that was transferred from [facility]: [name] 02/28/14 after she presented on 12/12 with syncope and elevated troponin, found to have multivessel CAD. Now POD 4 s/p 2V CABG. Consult for diabetes.     Patient is currently sitting in her chair, no acute distress. She states she has reduced appetite, denies nausea  or vomiting, no abdominal pain. Patient does have some mild incisional chest pain, denies shortness of breath.     Review of Systems:   Review of Systems - incisional chest pain; denies n/v/d, abdominal pain; all other systems reviewed and negative      Physical Exam:   Patient Vitals for the past 24 hrs:   BP Temp Temp src Pulse Resp SpO2 Weight   03/08/14 2132 - - - 68 - - -   03/08/14 2010 164/74 mmHg 98.1 F (36.7 C) Oral 73 16 95 % -   03/08/14 1500 165/78 mmHg 98 F (36.7 C) Oral 67 16 - -   03/08/14 1118 174/78 mmHg - - 71 - - -   03/08/14 6045 - - - 72 - - -   03/08/14 0759 184/80 mmHg 97.8 F (36.6 C) Oral - 16 98 % -   03/08/14 0541 - - - - - - 77.429 kg (170 lb 11.2 oz)   03/08/14 0345 165/77 mmHg 98.3 F (36.8 C) Oral 70 16 94 % -   03/08/14 0013 147/65 mmHg 99.2 F (37.3 C) Oral 73 17 95 % -     Body mass index is 31.74 kg/(m^2).    Intake/Output Summary (Last 24 hours) at 03/08/14 2253  Last data filed at 03/08/14 2057   Gross per 24 hour   Intake  650 ml   Output   2370 ml   Net  -1720 ml       General: awake, alert, oriented x 3; no acute distress.  Cardiovascular: regular rate and rhythm, no murmurs, rubs or gallops  Lungs: clear to auscultation bilaterally, without wheezing, rhonchi, or rales  Abdomen: soft, non-tender, non-distended; minimal BS; no rebound/guarding  Extremities: no clubbing, cyanosis, or edema    Meds:     Current Facility-Administered Medications   Medication Dose Route Frequency   . amiodarone  200 mg Oral Q12H SCH   . [START ON 03/09/2014] amLODIPine  10 mg Oral Daily   . aspirin  325 mg Oral Daily   . atorvastatin  10 mg Oral QHS   . cadexomer iodine   Topical Daily   . carvedilol  6.25 mg Oral Q12H SCH   . fluticasone  2 puff Inhalation BID   . gabapentin  600 mg Oral Q12H SCH   . heparin (porcine)  5,000 Units Subcutaneous Q12H [facility]   . hydrALAZINE  25 mg Oral Q8H SCH   . [START ON 03/09/2014] insulin aspart  3 Units Subcutaneous QAM [name]/BREAKFAST   . insulin aspart  3  Units Subcutaneous Daily with lunch   . insulin aspart  3 Units Subcutaneous Daily with dinner   . insulin glargine  8 Units Subcutaneous Q12H SCH   . mupirocin   Nasal Q12H SCH   . polyethylene glycol  17 g Oral Daily   . senna-docusate  1 tablet Oral QHS   . sodium bicarbonate  650 mg Oral TID   . venlafaxine  75 mg Oral Q12H [facility]         Labs:     HEMOGLOBIN A1C (%)   Date Value   03/03/2014 7.5*       POCT - GLUCOSE WHOLE BLOOD   Date/Time Value Ref Range Status   03/08/2014 1534 122* 70 - 100 mg/dL Final   16/12/9602 5409 75 70 - 100 mg/dL Final   81/19/1478 2956 100 70 - 100 mg/dL Final   21/30/8657 8469 135* 70 - 100 mg/dL Final   62/95/2841 3244 166* 70 - 100 mg/dL Final           Recent Labs      03/08/14   0358  03/07/14   0437   WBC  7.35  7.57   HEMOGLOBIN  8.5*  8.3*   HEMATOCRIT  28.9*  28.7*   PLATELETS  170  183   MCV  88.7  90.0       Recent Labs      03/08/14   0358  03/07/14   0437   SODIUM  139  138   POTASSIUM  4.7  4.9   CHLORIDE  111  109   CO2  20*  21*   BUN  39.0*  38.0*   CREATININE  1.9*  2.2*   GLUCOSE  92  110*   CALCIUM  8.1*  8.2*   MAGNESIUM  2.3  2.5   PHOSPHORUS  2.6  4.0         Case discussed with and patient also seen by: Dr. Leotis Shames      Signed by: Mickeal Skinner, PA-C

## 2014-03-08 NOTE — Consults (Signed)
SURGICAL CONSULTATION  Team 2: Vascular Surgery, [facility] [phone]    Date Time: 03/08/2014 9:50 AM  Patient Name: [name]  Attending Physician: Delila Spence, MD  Consulting Physician:  Davonna Belling, MD  Consulting Service: 2: Vascular Surgery, [facility] [phone]  Reason for consultation: chronic rle stenosis     History of Present Illness:   [name] is a 63 y.o. female who presents to the hospital with NSTEMI who underwent CABGx2 on the 18th. She of note has had a right dorsal foot wound for over a year.  She notes she can not walk far without pain in that foot and does not endorse rest pain.     Past Medical History:     Past Medical History   Diagnosis Date   . Diabetes mellitus    . Chronic kidney disease    . Myocardial infarction    . Hypertension    . Hyperlipidemia    . Peripheral arterial disease      06/28/13 right superficial femoral artery stenosis, tibial peroneal trunk stenosis, left CVL a stenosis, treated with angiography and angioplasty.   . Coronary artery disease      Left circumflex artery presumed DES.,  Chronically occluded right coronary artery stent   . Anemia      She redid to chronic disease and iron deficiency.     . Arterial leg ulcer      2014, receiving home health   . Chronic obstructive pulmonary disease      Previously on Spiriva and Advair       Past Surgical History:     Past Surgical History   Procedure Laterality Date   . Cardiac angiography and angioplasty  06/2013   . Colonoscopy  Unknown   . Tubal ligation     . Coronary artery bypass N/A 03/04/2014     Procedure: CORONARY ARTERY BYPASS;  Surgeon: Austin Miles, MD;  Location: [facility] OR;  Service: Cardiovascular;  Laterality: N/A;  LIMA to LAD  SVG to OM   . Endoscopic,vein harvest Left 03/04/2014     Procedure: ENDOSCOPIC,VEIN HARVEST;  Surgeon: Austin Miles, MD;  Location: [facility] OR;  Service: Cardiovascular;  Laterality: Left;  By D. Lazarus Gowda, RNFA. Left groin to mid-calf.   Rhae Hammock N/A 03/04/2014      Procedure: TEE;  Surgeon: Austin Miles, MD;  Location: [facility] HE[facility] OR;  Service: Cardiovascular;  Laterality: N/A;  probe [phone]       Family History:     Family History   Problem Relation Age of Onset   . Coronary artery disease Mother      Died at 29 from MI.   Marland Kitchen Coronary artery disease Father      Died at 20 from MI   . Diabetes type II       Mother and father       Social History:     History     Social History   . Marital Status: Widowed     Spouse Name: N/A     Number of Children: N/A   . Years of Education: N/A     Social History Main Topics   . Smoking status: Former Smoker -- 1.00 packs/day for 20 years     Quit date: 02/29/2012   . Smokeless tobacco: Not on file   . Alcohol Use: Not on file   . Drug Use: Not on file   . Sexual Activity: Not on file  Other Topics Concern   . Not on file     Social History Narrative       Allergies:     Allergies   Allergen Reactions   . Penicillins Anaphylaxis and Angioedema     Tolerates cephalosporins        Medications:     Prior to Admission medications    Medication Sig Start Date End Date Taking? Authorizing Provider   aspirin 81 MG chewable tablet Chew 81 mg by mouth daily.   Yes [provider]   atorvastatin (LIPITOR) 10 MG tablet Take 10 mg by mouth nightly.   Yes [provider]   carvedilol (COREG) 3.125 MG tablet Take 3.125 mg by mouth 2 (two) times daily with meals.   Yes [provider]   furosemide (LASIX) 20 MG tablet Take 20 mg by mouth daily.      Yes [provider]   gabapentin (NEURONTIN) 300 MG capsule Take 600 mg by mouth 3 (three) times daily.      Yes [provider]   hydrALAZINE (APRESOLINE) 50 MG tablet Take 50 mg by mouth 2 (two) times daily.   Yes [provider]   insulin lispro (HUMALOG) 100 UNIT/ML injection Inject into the skin 4 times daily - with meals and at bedtime.   Yes [provider]   lisinopril (PRINIVIL,ZESTRIL) 5 MG tablet Take 2.5 mg by mouth daily.   Yes  [provider]   naproxen (NAPROSYN) 250 MG tablet Take 250 mg by mouth daily.   Yes [provider]   venlafaxine (EFFEXOR) 75 MG tablet Take 75 mg by mouth 2 (two) times daily.   Yes [provider]       Review of Systems:   Otherwise negative      Physical Exam:     Filed Vitals:    03/08/14 0921   BP:    Pulse: 72   Temp:    Resp:    SpO2:        Intake and Output Summary (Last 24 hours) at Date Time    Intake/Output Summary (Last 24 hours) at 03/08/14 0950  Last data filed at 03/08/14 0300   Gross per 24 hour   Intake   1100 ml   Output   1420 ml   Net   -320 ml       General appearance - alert, well appearing, and in no distress  Mental status - alert, oriented to person, place, and time  Eyes - sclera anicteric  Chest - no tachypnea, retractions or cyanosis, sternotomy incision site healing well   Heart - normal rate and regular rhythm  Abdomen - soft, nontender, nondistended, no masses or organomegaly  Neurological - alert, oriented, normal speech, no focal findings or movement disorder noted  Musculoskeletal - no muscular tenderness noted  Extremities - biphasic dp/ mono pt b/l with non-palp femoral m/s intact 6x4 cm superficial wound on dorsal/medial right foot moves toes and ankle   Skin - otherwise negative     Labs:     Results     Procedure Component Value Units Date/Time    Phosphorus [[id number]] Collected:  03/08/14 0358    Specimen Information:  Blood Updated:  03/08/14 0617     Phosphorus 2.6 mg/dL     Basic metabolic panel (QAM) [[id number]]  (Abnormal) Collected:  03/08/14 0358    Specimen Information:  Blood Updated:  03/08/14 0603     Glucose 92 mg/dL  BUN 39.0 (H) mg/dL      Creatinine 1.9 (H) mg/dL      CALCIUM 8.1 (L) mg/dL      Sodium 086 mEq/L      Potassium 4.7 mEq/L      Chloride 111 mEq/L      CO2 20 (L) mEq/L     Magnesium (QAM) [[id number]] Collected:  03/08/14 0358    Specimen Information:  Blood Updated:  03/08/14 0603     Magnesium 2.3 mg/dL     GFR  [[id number]] Collected:  03/08/14 0358     EGFR 32.1 Updated:  03/08/14 0603    CBC (QAM) [[id number]]  (Abnormal) Collected:  03/08/14 0358    Specimen Information:  Blood / Blood Updated:  03/08/14 0548     WBC 7.35 x10 3/uL      RBC 3.26 (L) x10 6/uL      Hgb 8.5 (L) g/dL      Hematocrit 64.4 (L) %      MCV 88.7 fL      MCH 26.1 (L) pg      MCHC 29.4 (L) g/dL      RDW 20 (H) %      Platelets 170 x10 3/uL      MPV 10.3 fL      Nucleated RBC 4 (H) /100 WBC     Glucose Whole Blood - POCT [[id number]] Collected:  03/08/14 0537     POCT - Glucose Whole blood 100 mg/dL Updated:  63/87/56 4332    Glucose Whole Blood - POCT [[id number]]  (Abnormal) Collected:  03/07/14 2139     POCT - Glucose Whole blood 135 (H) mg/dL Updated:  09/15/14 0109    Glucose Whole Blood - POCT [[id number]]  (Abnormal) Collected:  03/07/14 1555     POCT - Glucose Whole blood 166 (H) mg/dL Updated:  02/54/27 0623    Glucose Whole Blood - POCT [[id number]]  (Abnormal) Collected:  03/07/14 1114     POCT - Glucose Whole blood 134 (H) mg/dL Updated:  61/60/73 7106            Rads:     Radiology Results (24 Hour)     ** No results found for the last 24 hours. **            Problem List:     Patient Active Problem List   Diagnosis   . NSTEMI (non-ST elevated myocardial infarction)   . Hypertensive urgency   . PAD (peripheral artery disease)   . Arterial leg ulcer   . Chronic obstructive pulmonary disease, unspecified COPD type   . Ischemic cardiomyopathy   . CAD (coronary artery disease)   . Diabetes   . Chronic kidney disease       Assessment:   63 y.o. female s/p cabg on 12/18 POD 4 with chronic right foot ulcer and b/l le pvd     Plan:   Will d/w Dr. Vanetta Mulders angio on this admission to target in-flow to RLE   Have made NPO for now and will update patient and team with further details of timing of the procedure   Also rec podiatry consult       Signed by: Hurshel Party

## 2014-03-09 DIAGNOSIS — E1165 Type 2 diabetes mellitus with hyperglycemia: Secondary | ICD-10-CM

## 2014-03-09 LAB — BASIC METABOLIC PANEL
BUN: 32 mg/dL — ABNORMAL HIGH (ref 7.0–19.0)
CO2: 22 mEq/L (ref 22–29)
Calcium: 8.1 mg/dL — ABNORMAL LOW (ref 8.5–10.5)
Chloride: 112 mEq/L — ABNORMAL HIGH (ref 100–111)
Creatinine: 1.6 mg/dL — ABNORMAL HIGH (ref 0.6–1.0)
Glucose: 62 mg/dL — ABNORMAL LOW (ref 70–100)
Potassium: 4.4 mEq/L (ref 3.5–5.1)
Sodium: 142 mEq/L (ref 136–145)

## 2014-03-09 LAB — GLUCOSE WHOLE BLOOD - POCT
Whole Blood Glucose POCT: 100 mg/dL (ref 70–100)
Whole Blood Glucose POCT: 129 mg/dL — ABNORMAL HIGH (ref 70–100)
Whole Blood Glucose POCT: 150 mg/dL — ABNORMAL HIGH (ref 70–100)
Whole Blood Glucose POCT: 151 mg/dL — ABNORMAL HIGH (ref 70–100)
Whole Blood Glucose POCT: 67 mg/dL — ABNORMAL LOW (ref 70–100)
Whole Blood Glucose POCT: 99 mg/dL (ref 70–100)

## 2014-03-09 LAB — PHOSPHORUS: Phosphorus: 2.4 mg/dL (ref 2.3–4.7)

## 2014-03-09 LAB — CBC
Hematocrit: 30.1 % — ABNORMAL LOW (ref 37.0–47.0)
Hgb: 8.8 g/dL — ABNORMAL LOW (ref 12.0–16.0)
MCH: 25.9 pg — ABNORMAL LOW (ref 28.0–32.0)
MCHC: 29.2 g/dL — ABNORMAL LOW (ref 32.0–36.0)
MCV: 88.5 fL (ref 80.0–100.0)
MPV: 10.1 fL (ref 9.4–12.3)
Nucleated RBC: 5 /100 WBC — ABNORMAL HIGH (ref 0–1)
Platelets: 181 10*3/uL (ref 140–400)
RBC: 3.4 10*6/uL — ABNORMAL LOW (ref 4.20–5.40)
RDW: 21 % — ABNORMAL HIGH (ref 12–15)
WBC: 8.32 10*3/uL (ref 3.50–10.80)

## 2014-03-09 LAB — GFR: EGFR: 39.2

## 2014-03-09 LAB — MAGNESIUM: Magnesium: 2.3 mg/dL (ref 1.6–2.6)

## 2014-03-09 MED ORDER — HYDRALAZINE HCL 25 MG PO TABS
25.00 mg | ORAL_TABLET | Freq: Once | ORAL | Status: AC
Start: 2014-03-09 — End: 2014-03-09
  Administered 2014-03-09: 25 mg via ORAL
  Filled 2014-03-09: qty 1

## 2014-03-09 MED ORDER — INSULIN ASPART 100 UNIT/ML SC SOLN
1.00 [IU] | Freq: Three times a day (TID) | SUBCUTANEOUS | Status: DC | PRN
Start: 2014-03-09 — End: 2014-03-14
  Administered 2014-03-10: 1 [IU] via SUBCUTANEOUS

## 2014-03-09 MED ORDER — AMIODARONE HCL 200 MG PO TABS
200.0000 mg | ORAL_TABLET | Freq: Every day | ORAL | Status: DC
Start: 2014-03-10 — End: 2014-03-14
  Administered 2014-03-10 – 2014-03-14 (×5): 200 mg via ORAL
  Filled 2014-03-09 (×5): qty 1

## 2014-03-09 MED ORDER — MAGNESIUM HYDROXIDE 400 MG/5ML PO SUSP
30.00 mL | Freq: Once | ORAL | Status: AC
Start: 2014-03-09 — End: 2014-03-09
  Administered 2014-03-09: 30 mL via ORAL
  Filled 2014-03-09: qty 30

## 2014-03-09 MED ORDER — CARVEDILOL 6.25 MG PO TABS
12.50 mg | ORAL_TABLET | Freq: Two times a day (BID) | ORAL | Status: DC
Start: 2014-03-09 — End: 2014-03-12
  Administered 2014-03-09 – 2014-03-12 (×7): 12.5 mg via ORAL
  Filled 2014-03-09 (×7): qty 2

## 2014-03-09 MED ORDER — INSULIN GLARGINE 100 UNIT/ML SC SOLN
5.00 [IU] | Freq: Every morning | SUBCUTANEOUS | Status: DC
Start: 2014-03-10 — End: 2014-03-09

## 2014-03-09 MED ORDER — HYDRALAZINE HCL 25 MG PO TABS
50.0000 mg | ORAL_TABLET | Freq: Two times a day (BID) | ORAL | Status: DC
Start: 2014-03-09 — End: 2014-03-14
  Administered 2014-03-09 – 2014-03-14 (×9): 50 mg via ORAL
  Filled 2014-03-09 (×12): qty 2

## 2014-03-09 MED ORDER — SODIUM CHLORIDE 0.9 % IV SOLN
INTRAVENOUS | Status: DC
Start: 2014-03-10 — End: 2014-03-10

## 2014-03-09 MED ORDER — DARBEPOETIN ALFA 25 MCG/0.42ML IJ SOSY
12.50 ug | PREFILLED_SYRINGE | INTRAMUSCULAR | Status: DC
Start: 2014-03-09 — End: 2014-03-14
  Administered 2014-03-09: 12.5 ug via SUBCUTANEOUS
  Filled 2014-03-09: qty 0.42

## 2014-03-09 MED ORDER — SODIUM CHLORIDE 0.9 % IV SOLN
INTRAVENOUS | Status: DC
Start: 2014-03-09 — End: 2014-03-09

## 2014-03-09 MED ORDER — FUROSEMIDE 20 MG PO TABS
20.0000 mg | ORAL_TABLET | Freq: Every day | ORAL | Status: DC
Start: 2014-03-09 — End: 2014-03-14
  Administered 2014-03-09 – 2014-03-14 (×6): 20 mg via ORAL
  Filled 2014-03-09 (×6): qty 1

## 2014-03-09 MED ORDER — INSULIN ASPART 100 UNIT/ML SC SOLN
2.00 [IU] | Freq: Every day | SUBCUTANEOUS | Status: DC
Start: 2014-03-09 — End: 2014-03-10
  Administered 2014-03-09: 2 [IU] via SUBCUTANEOUS

## 2014-03-09 MED ORDER — SENNOSIDES-DOCUSATE SODIUM 8.6-50 MG PO TABS
2.00 | ORAL_TABLET | Freq: Every evening | ORAL | Status: DC
Start: 2014-03-09 — End: 2014-03-14
  Administered 2014-03-09 – 2014-03-12 (×2): 2 via ORAL
  Filled 2014-03-09 (×4): qty 2

## 2014-03-09 MED ORDER — INSULIN GLARGINE 100 UNIT/ML SC SOLN
8.00 [IU] | Freq: Every morning | SUBCUTANEOUS | Status: DC
Start: 2014-03-10 — End: 2014-03-10

## 2014-03-09 MED ORDER — INSULIN GLARGINE 100 UNIT/ML SC SOLN
5.00 [IU] | Freq: Two times a day (BID) | SUBCUTANEOUS | Status: DC
Start: 2014-03-09 — End: 2014-03-09
  Administered 2014-03-09: 5 [IU] via SUBCUTANEOUS

## 2014-03-09 MED ORDER — INSULIN ASPART 100 UNIT/ML SC SOLN
2.00 [IU] | Freq: Every morning | SUBCUTANEOUS | Status: DC
Start: 2014-03-10 — End: 2014-03-10

## 2014-03-09 MED ORDER — CARVEDILOL 6.25 MG PO TABS
12.50 mg | ORAL_TABLET | Freq: Two times a day (BID) | ORAL | Status: DC
Start: 2014-03-09 — End: 2014-03-09

## 2014-03-09 MED ORDER — INSULIN ASPART 100 UNIT/ML SC SOLN
2.00 [IU] | Freq: Every day | SUBCUTANEOUS | Status: DC
Start: 2014-03-09 — End: 2014-03-10
  Administered 2014-03-10: 2 [IU] via SUBCUTANEOUS

## 2014-03-09 MED ORDER — DARBEPOETIN ALFA 25 MCG/0.42ML IJ SOSY
25.00 ug | PREFILLED_SYRINGE | INTRAMUSCULAR | Status: DC
Start: 2014-03-09 — End: 2014-03-09

## 2014-03-09 NOTE — Progress Notes (Signed)
Pt doing well. Still hesitant to have another procedure done, feels foot healing well.  Can take to cath lab possible thurs vs Friday.  Will continue to discuss with her and update her and team with more definitive time to offer her.  Sim Boast   Surgery Resident  PGY-4    Pager [phone].

## 2014-03-09 NOTE — Progress Notes (Signed)
[facility] Inpatient Rehab Note:  Referral received from Katharina Caper MSW [phone] Following patient for acute inpatient rehab at [facility]     Yes, willing to accept patient pending medical clearance and rehab appropriateness/bed availability, and once insurance Berkley Harvey is obtained.  Met with patient on 03/08/14, gave brochure and Verified post rehab discharge plan.    CM/SW notified of decision  Scotty Court, BSN, RN-BC   [phone]  [facility] Hospital-Rehab Admissions Liaison  [phone]

## 2014-03-09 NOTE — OT Progress Note (Signed)
[facility]   Occupational Therapy Treatment     Patient: [name]    MRN#: [id number]   [facility]  Bed: FI254/FI254-01      Discharge Recommendations:   Discharge Recommendation: Acute Rehab   DME Recommended for Discharge: TBA    If Acute Rehab recommended discharge disposition is not available, patient will need mod assist for ADLs, functional mobility, BSC, shower chair, w/c, hospital bed, RW equipment, and home OT.     Assessment:   Pt is progressing towards goals, demonstrates increased independence with sit to stand, but continues to require assist for transfers secondary to pain with full weight bearing through RLE. Continued OT intervention to increase safety and independence with ADLs.     Treatment Activities: ADL retraining, transfer training, pt education    Educated the patient to role of occupational therapy, plan of care, goals of therapy and safety with mobility and ADLs, energy conservation techniques, pursed lip breathing, sternal precautions, discharge instructions, home safety.    Plan:    OT Frequency Recommended: 2-3x/wk     Continue plan of care.       Precautions and Contraindications:   Falls  Sternal    Updated Medical Status/Imaging/Labs:  12/20 Chest XRAY  IMPRESSION:  Small effusions and bibasilar atelectasis.    Subjective: "I use a walker at home, how will I use one now?"   Patient's medical condition is appropriate for Occupational Therapy intervention at this time.  Patient is agreeable to participation in the therapy session. Nursing clears patient for therapy.    Pain:   Scale: not rated  Location: RLE  Intervention: rest, repositioning    Objective:   Patient is seated in a cardiac chair with telemetry, IV access, CT, dressing to RLE, chair alarm in place.    Cognition  Alert and oriented. Follows directions.    Functional Mobility  Sit to Stand: min A  Transfers: mod A     Balance  Static Sitting: supervision  Dynamic Sitting:  CGA  Static Standing: min A  Dynamic Standing: mod A    Self Care and Home Management  UE Dressing: independent while seated to don gown overhead  LE Dressing: mod A for pants management  Toileting: BSC    Therapeutic Exercises  Exercise with activity    Participation: good  Endurance: fair    Patient left with call bell within reach, all needs met, SCDs not in place- RN in room to assist with dressing and re-donn SCDs, fall mat in place, chair alarm in place and all questions answered. RN notified of session outcome and patient response.     Goals:  Time For Goal Achievement: 5 visits  ADL Goals  Patient will groom self: Supervision, at sinkside, 5 visits  Patient will dress upper body: Goal met  Patient will dress lower body: Supervision, 5 visits  Mobility and Transfer Goals  Pt will perform functional transfers: Stand by Assist, with rolling walker, 5 visits        Executive Fucntion Goals  Pt will follow sternal precautions: independent, to increase ability to complete ADLs, 5 visits                Jacalyn Lefevre, MS, OTR/L  Pager [phone]    Time of Treatment  OT Received On: 03/09/14  Start Time: 1120  Stop Time: 1145  Time Calculation (min): 25 min    Treatment # 1 of 5 visits

## 2014-03-09 NOTE — Progress Notes (Addendum)
Pt.AOx3,SR on tele,CTx2 to 1 atrium,suction,no air leak,no c/o pain,1 person assist to BSC,voiding freely,r foot dressing CDI,MS/LLE incision OTA,reminded on NPO post MN for possible RLE angioram,will start on NS at 50cc/hr at 12mn as ordered.Labs in am.Will continue hourly rounding and plan of care.

## 2014-03-09 NOTE — Plan of Care (Signed)
Problem: Safety  Goal: Patient will be free from injury during hospitalization  Intervention: Assess patient's risk for falls and implement fall prevention plan of care per policy  Pt using call button. Grip socks on,chair alarm on,  floor mat in place, and hourly rounding. Call button within reach.

## 2014-03-09 NOTE — PT Progress Note (Signed)
[facility]   Physical Therapy Treatment  Patient:  [name] MRN#:  [id number]  [facility]  Bed: FI254/FI254-01    Discharge Recommendations:   D/C Recommendations: Acute Rehab   DME Recommendations: tbd    If Acute Rehab recommended discharge disposition is not available, patient will need mod assist for mobility, w/c for distances, bsc, hospital bed equipment, and continued skilled PT.        Assessment:   Pt required decrease assist w/ sit/stand. Increasing tolerance to wt bearing R foot for gt. Very motivated to increase mobility. Will see for continued PT to increase strength, improve balance/endurance for max functional independence w/ mobility.    Treatment Activities: gt,transfers, ex, standing balance    Educated the patient to role of physical therapy, plan of care, goals of therapy and safety with mobility and ADLs, energy conservation techniques, sternal precautions.    Plan:   PT Frequency: 3-4x/wk    Continue plan of care.       Precautions and Contraindications:   Falls, sternal    Updated Medical Status/Imaging/Labs:   Reviewed: possible RLE angiogram  03-10-14  Subjective:    I think I can do more  Patient's medical condition is appropriate for Physical Therapy intervention at this time.  Patient is agreeable to participation in the therapy session. Nursing clears patient for therapy.    Pain:   Scale: controlled  Location: R foot /sx site  Intervention: position of comfort/pain meds    Objective:   Patient is in bedside chair with telemetry, iv , R foot w/ ace wrap/dressing in place.    Cognition  Awake, follows commands    Functional Mobility  Rolling: nt  Supine to Sit:nt  Scooting: min  Sit to Stand: min  Stand to Sit: min  Transfers: mod    Ambulation  Level of Assistance required: mod  Ambulation Distance: 3 ft x 2  Pattern: decrease wt bearing R foot, decrease balance  Device Used: therapist  Weightbearing Status: wbat    Balance:  Static Sitting:  supervision  Dynamic Sitting: cga  Static Standing: min  Dynamic Standing: mod    Therapeutic Exercises  Ap, qs, hip flex, hip abd, laq    Patient Participation: good  Patient Endurance: fair    Patient left with call bell within reach, all needs met, SCDs on LLE, fall mat in place, chair alarm on and all questions answered. RN notified of session outcome and patient response.     Goals:  Goals  Time for Goal Acheivement: 7 visits  Pt Will Go Supine To Sit: with contact guard assist  Pt Will Perform Sit To Supine: with contact guard assist  Pt Will Transfer Bed/Chair: with minimal assist  Pt Will Ambulate:  (w/ mod x 2 100 ft)      Georgeanne Nim PT# [id number]    Time of Treatment:  PT Received On: 03/09/14  Start Time: 1200  Stop Time: 1225  Time Calculation (min): 25 min  Treatment # 2 out of 7 visits    }

## 2014-03-09 NOTE — Progress Notes (Signed)
Vascular Surgery Progress Note:    -Pt NPO at midnight due to possible RLE angiogram procedure tomorrow. Will reassess in the AM.     -Madison Dawes, MD  General Surgery Resident  Vascular- [id number]

## 2014-03-09 NOTE — Progress Notes (Signed)
DIABETES CONSULT FOLLOW-UP    Date Time: 03/09/2014 1:40 PM  Patient Name: [name]  Attending Physician: Austin Miles, MD  Consulting Physician: Raynelle Bring, MD    CC: follow-up: Uncontrolled DM2    Assessment:     Patient Active Problem List    Diagnosis Date Noted   . CAD (coronary artery disease) 03/05/2014   . Diabetes 03/05/2014   . Chronic kidney disease 03/05/2014   . Ischemic cardiomyopathy    . NSTEMI (non-ST elevated myocardial infarction) 02/28/2014   . Hypertensive urgency    . PAD (peripheral artery disease)    . Arterial leg ulcer    . Chronic obstructive pulmonary disease, unspecified COPD type        63 y.o. with hx of DM2, HTN, PVD, COPD, CKD, CAD s/p CABGx2    Recommendations:   Uncontrolled DM2, in setting of AKI/CKD  Creat 1.6 today  FBG 62 today, s/p lantus 10 qhs  Decrease lantus 5 qAM for today  Can reassess over 24hrs as creat improves      >50% if this spent at bedside, 25 minutes of critical decision making, lab review, dose adjustments, and coordination of care       HPI/Subjective: 63 yo woman with h/o DM type 2, HTN, PAD, CAD s/p RCA stent in 03/2013, CKD, COPD that was transferred from [facility] 02/28/14 after she presented on 12/12 with syncope and elevated troponin, found to have multivessel CAD s/p CABG 2V.  On lantus and lispro sliding scale at home.     Review of Systems:   Review of Systems - patient c/o sternal wound pain with IS--improving today. Denies CP, vomiting, diarrhea.   All other systems reviewed and found to be negative.         Physical Exam:   Patient Vitals for the past 24 hrs:   BP Temp Temp src Pulse Resp SpO2 Weight   03/09/14 1154 156/69 mmHg 98 F (36.7 C) Oral 70 16 97 % -   03/09/14 0735 146/66 mmHg 98.2 F (36.8 C) Tympanic 67 18 95 % -   03/09/14 0657 147/68 mmHg - - 71 - - -   03/09/14 0642 148/66 mmHg - - 71 - - -   03/09/14 0615 154/64 mmHg - - 75 - - -   03/09/14 0404 171/79 mmHg 97.7 F (36.5 C) Oral 74 16 96 % 73.891 kg (162 lb  14.4 oz)   03/09/14 0032 155/67 mmHg - - 70 - 94 % -   03/09/14 0025 160/68 mmHg 97.9 F (36.6 C) Oral 71 16 96 % -   03/08/14 2300 163/72 mmHg - - 73 - - -   03/08/14 2132 - - - 68 - - -   03/08/14 2010 164/74 mmHg 98.1 F (36.7 C) Oral 73 16 95 % -   03/08/14 1500 165/78 mmHg 98 F (36.7 C) Oral 67 16 - -     Body mass index is 30.29 kg/(m^2).    Intake/Output Summary (Last 24 hours) at 03/09/14 1340  Last data filed at 03/09/14 0645   Gross per 24 hour   Intake    600 ml   Output   1475 ml   Net   -875 ml       General: awake, alert, oriented x 3; no acute distress.  Cardiovascular: regular rate and rhythm  Lungs: dec BS bases bilat  Abdomen: soft, non-tender, non-distended, normoactive bowel sounds, no rebound or guarding  Extremities: no clubbing,  cyanosis, or edema  Other: chest wound-c/d/i    Meds:     Medications were reviewed:    Labs:     POCT - GLUCOSE WHOLE BLOOD   Date/Time Value Ref Range Status   03/09/2014 1153 100 70 - 100 mg/dL Final   16/12/9602 5409 99 70 - 100 mg/dL Final   81/19/1478 2956 67* 70 - 100 mg/dL Final   21/30/8657 8469 129* 70 - 100 mg/dL Final   62/95/2841 3244 83 70 - 100 mg/dL Final       HEMOGLOBIN A1C (%)   Date Value   03/03/2014 7.5*       Recent Labs      03/09/14   0608  03/08/14   0358   WBC  8.32  7.35   HEMOGLOBIN  8.8*  8.5*   HEMATOCRIT  30.1*  28.9*   PLATELETS  181  170       Recent Labs      03/09/14   0608  03/08/14   0358   SODIUM  142  139   POTASSIUM  4.4  4.7   CHLORIDE  112*  111   CO2  22  20*   BUN  32.0*  39.0*   CREATININE  1.6*  1.9*   GLUCOSE  62*  92   CALCIUM  8.1*  8.1*   MAGNESIUM  2.3  2.3   PHOSPHORUS  2.4  2.6       No results for input(s): AST, ALT, ALKPHOS, PROT, ALB in the last 72 hours.    No results for input(s): PTT, PT, INR in the last 72 hours.      Signed by: Raynelle Bring, MD

## 2014-03-09 NOTE — Progress Notes (Signed)
CARDIAC SURGERY CVSD/CTU PROGRESS NOTE    POD: 5    SURGERY: CABGx2    EF: 45%    SURGEON: Austin Miles, MD    ASSESSMENT AND PLAN:  Active Problems:    NSTEMI (non-ST elevated myocardial infarction)    Hypertensive urgency    PAD (peripheral artery disease)    Arterial leg ulcer    Chronic obstructive pulmonary disease, unspecified COPD type    Ischemic cardiomyopathy    CAD (coronary artery disease)    Diabetes    Chronic kidney disease    NEURO:   Good pain control on PO meds   PT/OT/Ambulation: recs acute rehab at discharge    CARDIAC:   HD stable in SR   Hypertension: Coreg increased 12.5'', continue amlodipine and hydralazine, holding home ACE-I due to AKI   PAD: will go for stent of right iliac artery with Dr. Vanetta Mulders 03/10/14   Pacing wires: removed yesterday   Core Measures: ASA: Y; Beta blocker: Y; ACE: N, AKI; Statin: yes    PULMONARY:   On RA   Chest tubes: monitoring drainage for possible removal today   Chest xray: pending after CT removal        GI:   Ok PO, +flatus, +BM    RENAL:   AKI on CKD: baseline CR 1.3-1.5, down to 1.6 today after summit of 2.4   Resume home Lasix 20'     HEME:   H&H trending up   DVT Prophylaxis: SCDs, [facility]    ID:   Afebrile   Wound Care following preop RLE wound   Cultures: none     ENDO:    DM: A1c 7.5, Endocrine following, continue Lantus/Novolog    DISPOSITION:    D/c to acute rehab when medically-optimized.    SUBJECTIVE:     Disappointed that stent placement isn't happening today.    OBJECTIVE:  VITALS LAST 24 HOURS:  Temp:  [97.7 F (36.5 C)-98.2 F (36.8 C)] 98.2 F (36.8 C)  Heart Rate:  [67-75] 67  Resp Rate:  [16-18] 18  BP: (146-171)/(64-79) 146/66 mmHg  O2 saturation: 94-98% on RA    PHYSICAL EXAM:  General: in chair, good affect  Lungs: ctab  Heart: RRR, s1/s2 present, no m/r/g  Abd: nd, +bs, soft, nt  Neuro: no focal deficits  Extremities: no edema  Incisions: c/d/i  T/L/D: PIV, CTs    RECENT LABS:  Results     Procedure Component Value  Units Date/Time    Basic metabolic panel (QAM) [[id number]]  (Abnormal) Collected:  03/09/14 0608    Specimen Information:  Blood Updated:  03/09/14 0659     Glucose 62 (L) mg/dL      BUN 40.9 (H) mg/dL      Creatinine 1.6 (H) mg/dL      CALCIUM 8.1 (L) mg/dL      Sodium 811 mEq/L      Potassium 4.4 mEq/L      Chloride 112 (H) mEq/L      CO2 22 mEq/L     Magnesium (QAM) [[id number]] Collected:  03/09/14 0608    Specimen Information:  Blood Updated:  03/09/14 0659     Magnesium 2.3 mg/dL     Phosphorus [[id number]] Collected:  03/09/14 0608    Specimen Information:  Blood Updated:  03/09/14 0659     Phosphorus 2.4 mg/dL     GFR [[id number]] Collected:  03/09/14 0608     EGFR 39.2 Updated:  03/09/14 0659    CBC (QAM) [  [id number]]  (Abnormal) Collected:  03/09/14 0608    Specimen Information:  Blood / Blood Updated:  03/09/14 0642     WBC 8.32 x10 3/uL      Hgb 8.8 (L) g/dL      Hematocrit 19.1 (L) %      Platelets 181 x10 3/uL      RBC 3.40 (L) x10 6/uL      MCV 88.5 fL      MCH 25.9 (L) pg      MCHC 29.2 (L) g/dL      RDW 21 (H) %      MPV 10.1 fL      Nucleated RBC 5 (H) /100 WBC     Glucose Whole Blood - POCT [[id number]]  (Abnormal) Collected:  03/09/14 0555     POCT - Glucose Whole blood 67 (L) mg/dL Updated:  30/86/57 8469    Glucose Whole Blood - POCT [[id number]] Collected:  03/09/14 0615     POCT - Glucose Whole blood 99 mg/dL Updated:  24/40/10 2725    Glucose Whole Blood - POCT [[id number]]  (Abnormal) Collected:  03/09/14 0003     POCT - Glucose Whole blood 129 (H) mg/dL Updated:  42/59/56 3875    Glucose Whole Blood - POCT [[id number]] Collected:  03/08/14 2231     POCT - Glucose Whole blood 83 mg/dL Updated:  84/16/60 6301    Glucose Whole Blood - POCT [[id number]]  (Abnormal) Collected:  03/08/14 1534     POCT - Glucose Whole blood 122 (H) mg/dL Updated:  57/32/20 2542        MEDICATIONS:  Current Facility-Administered Medications   Medication Dose Route Frequency   . amiodarone  200 mg Oral Q12H SCH   . [START  ON 03/10/2014] amiodarone  200 mg Oral Daily   . amLODIPine  10 mg Oral Daily   . aspirin  325 mg Oral Daily   . atorvastatin  10 mg Oral QHS   . cadexomer iodine   Topical Daily   . carvedilol  12.5 mg Oral Q12H SCH   . fluticasone  2 puff Inhalation BID   . gabapentin  600 mg Oral Q12H SCH   . heparin (porcine)  5,000 Units Subcutaneous Q12H [facility]   . hydrALAZINE  25 mg Oral Q8H SCH   . [START ON 03/10/2014] insulin aspart  2 Units Subcutaneous QAM W/BREAKFAST   . insulin aspart  2 Units Subcutaneous Daily with lunch   . insulin aspart  2 Units Subcutaneous Daily with dinner   . insulin glargine  5 Units Subcutaneous Q12H SCH   . polyethylene glycol  17 g Oral Daily   . senna-docusate  1 tablet Oral QHS   . sodium bicarbonate  650 mg Oral TID   . venlafaxine  75 mg Oral Q12H SCH     INTAKE/OUTPUT:  Intake and Output Summary (Last 24 hours) at Date Time    Intake/Output Summary (Last 24 hours) at 03/09/14 1155  Last data filed at 03/09/14 0645   Gross per 24 hour   Intake    600 ml   Output   1775 ml   Net  -1175 ml                    Jacelyn Pi, PA-C

## 2014-03-09 NOTE — Progress Notes (Signed)
[facility], [employer].  Progress Note    Assessment:   AKI, hemodynamic instability mediated along with IV contrast and CABG.    Anemia of CKD; Fe deficiency   CKD III   CAD s/p CABG   HTN - poorly controlled   Metabolic acidosis    Plan:   Cr improving gradually, off of IVF currently   Coreg increased to 12.5mg  PO BID. Can increase hydralazine to 75mg  BID if required. Will monitor on higher dose coreg for today.    C/w bicarb tabs   S/p iron sucrose on 12/20-12/22. Will start aranesp.     Madison Russian, MD  Office - [phone]  [employer] [phone]  ++++++++++++++++++++++++++++++++++++++++++++++++++++++++++++++  Subjective:  No new complaints    Medications:  Scheduled Meds:  Current Facility-Administered Medications   Medication Dose Route Frequency   . amiodarone  200 mg Oral Q12H SCH   . [START ON 03/10/2014] amiodarone  200 mg Oral Daily   . amLODIPine  10 mg Oral Daily   . aspirin  325 mg Oral Daily   . atorvastatin  10 mg Oral QHS   . cadexomer iodine   Topical Daily   . carvedilol  12.5 mg Oral Q12H SCH   . darbepoetin alfa (ARANESP) injection  25 mcg Subcutaneous Weekly   . fluticasone  2 puff Inhalation BID   . furosemide  20 mg Oral Daily   . gabapentin  600 mg Oral Q12H SCH   . heparin (porcine)  5,000 Units Subcutaneous Q12H [facility]   . hydrALAZINE  50 mg Oral Q12H   . [START ON 03/10/2014] insulin aspart  2 Units Subcutaneous QAM W/BREAKFAST   . insulin aspart  2 Units Subcutaneous Daily with lunch   . insulin aspart  2 Units Subcutaneous Daily with dinner   . [START ON 03/10/2014] insulin glargine  5 Units Subcutaneous QAM   . magnesium hydroxide  30 mL Oral Once   . polyethylene glycol  17 g Oral Daily   . senna-docusate  2 tablet Oral QHS   . sodium bicarbonate  650 mg Oral TID   . venlafaxine  75 mg Oral Q12H SCH     Continuous Infusions:   PRN Meds:acetaminophen, albuterol-ipratropium, bisacodyl, dextrose, glucagon (rDNA), insulin aspart, ondansetron,  oxyCODONE-acetaminophen, traMADol    Objective:  Vital signs in last 24 hours:  Temp:  [97.7 F (36.5 C)-98.2 F (36.8 C)] 98 F (36.7 C)  Heart Rate:  [67-75] 70  Resp Rate:  [16-18] 16  BP: (146-171)/(64-79) 156/69 mmHg  Intake/Output last 24 hours:    Intake/Output Summary (Last 24 hours) at 03/09/14 1556  Last data filed at 03/09/14 0645   Gross per 24 hour   Intake    600 ml   Output   1475 ml   Net   -875 ml     Intake/Output this shift:       Physical Exam:   Gen: WD WN NAD   CV: S1 S2 N RRR   Chest: decreased BS with rales at bases. +sternal wound drain   Ab: ND NT soft no HSM +BS   Ext: No C/E    Labs:    Recent Labs  Lab 03/09/14  0608 03/08/14  0358 03/07/14  0437  03/05/14  0150   GLUCOSE 62* 92 110*  < > 112*   BUN 32.0* 39.0* 38.0*  < > 25.0*   CREATININE 1.6* 1.9* 2.2*  < > 1.8*   CALCIUM  8.1* 8.1* 8.2*  < > 8.5   SODIUM 142 139 138  < > 137   POTASSIUM 4.4 4.7 4.9  < > 4.2   CHLORIDE 112* 111 109  < > 108   CO2 22 20* 21*  < > 23   ALBUMIN  --   --   --   --  3.0*   PHOSPHORUS 2.4 2.6 4.0  --   --    MAGNESIUM 2.3 2.3 2.5  < > 2.7*   < > = values in this interval not displayed.    Recent Labs  Lab 03/09/14  0608 03/08/14  0358 03/07/14  0437   WBC 8.32 7.35 7.57   HEMOGLOBIN 8.8* 8.5* 8.3*   HEMATOCRIT 30.1* 28.9* 28.7*   MCV 88.5 88.7 90.0   MCH, POC 25.9* 26.1* 26.0*   MCHC 29.2* 29.4* 28.9*   RDW 21* 20* 19*   MPV 10.1 10.3 11.0   PLATELETS 181 170 183

## 2014-03-09 NOTE — Progress Notes (Signed)
PT A&Ox3. SR on tele. PT walked once in hallway. PT two person assist. PT using IS. 2 CT to -20 suction, no air leak, CDI. MS and left leg OTA, CDI, no S&S of infection. Right foot ulcer dressing changed per order. PT complaints of pain, relieved with percocet. Call button within reach. Hourly rounding.

## 2014-03-09 NOTE — Plan of Care (Addendum)
Problem: Safety  Goal: Patient will be free from injury during hospitalization  Outcome: Progressing  A&Ox3. NSR in 70's. ~2330 SBP elevated to 160's-170's, HR stable, scheduled hydralazine was given but remained in the 160's. PA Hermenia Bers aware and ordered additional 1x dose of Coreg 6.25mg . Administered to PT and SBP decreased to 150's. ~0500 PT's SBP elevated to 170's, PA aware and ordered to modify coreg dose to 12.5mg  and to give new dose now. PT's SBP is now stable in 140's. Bedtime BG check was 83, PT ate a bedtime snack and recheck BG was 129. Before breakfast BG was 64, gave PT orange juice and BG increased to 99. PT's incision pain is controlled with percocet. Midsternal and left leg incisions CDI, right foot ulcer dressed with gauze and ace bandage, CDI. Chest tube x2 to suction with no air leak draining serosanguinous drainage. 2-person assist out of bed. PT in bed with floor mat in place, bed alarm and SCDs on. Call bell and all belongings within reach. Will continue to hourly round on PT.

## 2014-03-10 ENCOUNTER — Encounter
Admission: AD | Disposition: A | Discharge status: Rehabilitation Facility | Payer: Self-pay | Source: Other Acute Inpatient Hospital | Attending: Thoracic Surgery (Cardiothoracic Vascular Surgery)

## 2014-03-10 ENCOUNTER — Inpatient Hospital Stay: Payer: BLUE CROSS/BLUE SHIELD

## 2014-03-10 LAB — BASIC METABOLIC PANEL
BUN: 32 mg/dL — ABNORMAL HIGH (ref 7.0–19.0)
CO2: 24 mEq/L (ref 22–29)
Calcium: 8.2 mg/dL — ABNORMAL LOW (ref 8.5–10.5)
Chloride: 111 mEq/L (ref 100–111)
Creatinine: 1.6 mg/dL — ABNORMAL HIGH (ref 0.6–1.0)
Glucose: 88 mg/dL (ref 70–100)
Potassium: 4.9 mEq/L (ref 3.5–5.1)
Sodium: 142 mEq/L (ref 136–145)

## 2014-03-10 LAB — GFR: EGFR: 39.2

## 2014-03-10 LAB — GLUCOSE WHOLE BLOOD - POCT
Whole Blood Glucose POCT: 87 mg/dL (ref 70–100)
Whole Blood Glucose POCT: 185 mg/dL — ABNORMAL HIGH (ref 70–100)
Whole Blood Glucose POCT: 158 mg/dL — ABNORMAL HIGH (ref 70–100)
Whole Blood Glucose POCT: 143 mg/dL — ABNORMAL HIGH (ref 70–100)

## 2014-03-10 SURGERY — ARTERIAL-  LOWER EXTREMITY ANGIOGRAPHY POSS PTA
Anesthesia: Conscious Sedation | Laterality: Right

## 2014-03-10 MED ORDER — INSULIN ASPART 100 UNIT/ML SC SOLN
3.00 [IU] | Freq: Every morning | SUBCUTANEOUS | Status: DC
Start: 2014-03-11 — End: 2014-03-14
  Administered 2014-03-11 – 2014-03-14 (×4): 3 [IU] via SUBCUTANEOUS

## 2014-03-10 MED ORDER — INSULIN ASPART 100 UNIT/ML SC SOLN
3.00 [IU] | Freq: Every day | SUBCUTANEOUS | Status: DC
Start: 2014-03-11 — End: 2014-03-11
  Administered 2014-03-11: 3 [IU] via SUBCUTANEOUS

## 2014-03-10 MED ORDER — INSULIN ASPART 100 UNIT/ML SC SOLN
3.00 [IU] | Freq: Every day | SUBCUTANEOUS | Status: DC
Start: 2014-03-10 — End: 2014-03-11
  Administered 2014-03-11: 3 [IU] via SUBCUTANEOUS

## 2014-03-10 NOTE — Progress Notes (Signed)
Vascular Surgery Progress note    Patient reports pain in right ankle. She is willing to undergo angio today to improve blood flow/wound healing.     A/P: 63 year old female with nonhealing ulcer of RLE, willa dd on for angio this AM pending availability.   - will return for consent  - keep NPO   - will Discuss with Dr. Francina Ames, MD.   PGY-2 General Surgery  # [phone]

## 2014-03-10 NOTE — Progress Notes (Signed)
CARDIAC SURGERY CVSD/CTU PROGRESS NOTE    POD: 6    SURGERY: CABGx2    EF: 45%    SURGEON: Austin Miles, MD    ASSESSMENT AND PLAN:  Active Problems:    NSTEMI (non-ST elevated myocardial infarction)    Hypertensive urgency    PAD (peripheral artery disease)    Arterial leg ulcer    Chronic obstructive pulmonary disease, unspecified COPD type    Ischemic cardiomyopathy    CAD (coronary artery disease)    Diabetes    Chronic kidney disease    NEURO:   Good pain control on PO meds   PT/OT/Ambulation: recs acute rehab at discharge    CARDIAC:   HD stable in SR   Hypertension: improved control on Coreg/hydralazine/Norvasc, holding home ACE-I due to AKI   PAD: placement of right iliac artery stent postponed as it will require IV contrast dye and she is recovering from AKI on CKD, will be done as an outpatient   Pacing wires: out   Core Measures: ASA: Y; Beta blocker: Y; ACE: N, AKI; Statin: yes    PULMONARY:   On RA   Chest tubes: removed   Chest xray: pa/lat pending        GI:   Ok PO, +flatus, +BM    RENAL:   AKI on CKD: baseline CR 1.3-1.5, stable at 1.6   Resume home Lasix 20'     HEME:   H&H trending up   DVT Prophylaxis: SCDs, [facility]    ID:   Afebrile   Wound Care following preop RLE wound   Cultures: none     ENDO:    DM: A1c 7.5, Endocrine following, continue Lantus/Novolog    DISPOSITION:    D/c to acute rehab when bed available.    SUBJECTIVE:     Feels a little better.  No new complaints.  No overnight events.    OBJECTIVE:  VITALS LAST 24 HOURS:  Temp:  [97.6 F (36.4 C)-98.5 F (36.9 C)] 97.6 F (36.4 C)  Heart Rate:  [69-78] 78  Resp Rate:  [16-18] 18  BP: (139-168)/(65-77) 139/65 mmHg  O2 saturation: 95-97% on RA    PHYSICAL EXAM:  General: in chair, good affect  Lungs: ctab  Heart: RRR, ?I-II/VI SEM heard best at the right sternoclavicular border, s1/s2 present, no m/r/g  Abd: nd, +bs, soft, nt  Neuro: no focal deficits  Extremities: no edema  Incisions: c/d/i  T/L/D: PIV    RECENT  LABS:  Results     Procedure Component Value Units Date/Time    Glucose Whole Blood - POCT [[id number]]  (Abnormal) Collected:  03/10/14 1119     POCT - Glucose Whole blood 185 (H) mg/dL Updated:  40/98/11 9147    Glucose Whole Blood - POCT [[id number]] Collected:  03/10/14 0544     POCT - Glucose Whole blood 87 mg/dL Updated:  86/57/84 6962    Glucose Whole Blood - POCT [[id number]]  (Abnormal) Collected:  03/09/14 2215     POCT - Glucose Whole blood 150 (H) mg/dL Updated:  40/10/27 2536    Basic Metabolic Panel [[id number]]  (Abnormal) Collected:  03/10/14 0541    Specimen Information:  Blood Updated:  03/10/14 0703     Glucose 88 mg/dL      BUN 59.5 (H) mg/dL      Creatinine 1.6 (H) mg/dL      CALCIUM 8.2 (L) mg/dL      Sodium 638 mEq/L  Potassium 4.9 mEq/L      Chloride 111 mEq/L      CO2 24 mEq/L     GFR [[id number]] Collected:  03/10/14 0541     EGFR 39.2 Updated:  03/10/14 0703    Glucose Whole Blood - POCT [[id number]]  (Abnormal) Collected:  03/09/14 1616     POCT - Glucose Whole blood 151 (H) mg/dL Updated:  78/29/56 2130        MEDICATIONS:  Current Facility-Administered Medications   Medication Dose Route Frequency   . amiodarone  200 mg Oral Daily   . amLODIPine  10 mg Oral Daily   . aspirin  325 mg Oral Daily   . atorvastatin  10 mg Oral QHS   . cadexomer iodine   Topical Daily   . carvedilol  12.5 mg Oral Q12H SCH   . darbepoetin alfa (ARANESP) injection  12.5 mcg Subcutaneous Weekly   . fluticasone  2 puff Inhalation BID   . furosemide  20 mg Oral Daily   . gabapentin  600 mg Oral Q12H SCH   . heparin (porcine)  5,000 Units Subcutaneous Q12H [facility]   . hydrALAZINE  50 mg Oral Q12H   . insulin aspart  2 Units Subcutaneous QAM W/BREAKFAST   . insulin aspart  2 Units Subcutaneous Daily with lunch   . insulin aspart  2 Units Subcutaneous Daily with dinner   . insulin glargine  8 Units Subcutaneous QAM   . polyethylene glycol  17 g Oral Daily   . senna-docusate  2 tablet Oral QHS   . sodium bicarbonate  650 mg  Oral TID   . venlafaxine  75 mg Oral Q12H SCH     INTAKE/OUTPUT:  Intake and Output Summary (Last 24 hours) at Date Time    Intake/Output Summary (Last 24 hours) at 03/10/14 1222  Last data filed at 03/10/14 0600   Gross per 24 hour   Intake   1295 ml   Output   2470 ml   Net  -1175 ml                    Jacelyn Pi, PA-C

## 2014-03-10 NOTE — Progress Notes (Signed)
DIABETES CONSULT FOLLOW-UP    Date Time: 03/10/2014 12:33 PM  Patient Name: [name]  Attending Physician: Austin Miles, MD  Consulting Physician: Raynelle Bring, MD    CC: follow-up: Uncontrolled DM2    Assessment:     Patient Active Problem List    Diagnosis Date Noted   . CAD (coronary artery disease) 03/05/2014   . Diabetes 03/05/2014   . Chronic kidney disease 03/05/2014   . Ischemic cardiomyopathy    . NSTEMI (non-ST elevated myocardial infarction) 02/28/2014   . Hypertensive urgency    . PAD (peripheral artery disease)    . Arterial leg ulcer    . Chronic obstructive pulmonary disease, unspecified COPD type        64 y.o. with hx of DM2, HTN, PVD, COPD, CKD, CAD s/p CABGx2    Recommendations:   Uncontrolled DM2, in setting of AKI/CKD  Creat stable 1.6 today  FBG 88,  Now 185   Lower insulin req sec AKI---Hold lantus for now, increase novolog 3 tid with meals  Will follow       >50% if this spent at bedside with patient and family, 25 minutes of critical decision making, lab review, dose adjustments, and coordination of care       HPI/Subjective: 63 yo woman with h/o DM type 2, HTN, PAD, CAD s/p RCA stent in 03/2013, CKD, COPD that was transferred from [facility] 02/28/14 after she presented on 12/12 with syncope and elevated troponin, found to have multivessel CAD s/p CABG 2V.  On lantus and lispro sliding scale at home. Po intake better today. Eating food from home    Review of Systems:   Review of Systems - patient po intake better.  Denies CP, vomiting, diarrhea.   All other systems reviewed and found to be negative.         Physical Exam:     Patient Vitals for the past 24 hrs:   BP Temp Temp src Pulse Resp SpO2 Weight   03/10/14 1200 139/65 mmHg 97.6 F (36.4 C) - 78 - 95 % -   03/10/14 0900 - - - 70 18 94 % -   03/10/14 0803 167/77 mmHg - - 73 - - -   03/10/14 0800 168/70 mmHg 98.2 F (36.8 C) Oral 72 18 94 % -   03/10/14 0409 148/67 mmHg 97.9 F (36.6 C) Oral 69 18 96 % -   03/09/14  2326 154/68 mmHg 98.4 F (36.9 C) Oral 73 16 95 % -   03/09/14 2300 - - - 71 - - 71.487 kg (157 lb 9.6 oz)   03/09/14 2212 - - - 70 18 95 % -   03/09/14 1954 153/67 mmHg 98.5 F (36.9 C) Oral 76 16 96 % -   03/09/14 1612 155/71 mmHg 98.5 F (36.9 C) Oral 71 16 97 % -     Body mass index is 29.3 kg/(m^2).    Intake/Output Summary (Last 24 hours) at 03/10/14 1233  Last data filed at 03/10/14 0600   Gross per 24 hour   Intake   1295 ml   Output   2470 ml   Net  -1175 ml       General: awake, alert, oriented x 3; no acute distress.  Cardiovascular: regular rate and rhythm  Lungs: CTA bilat  Abdomen: soft, non-tender, non-distended, normoactive bowel sounds, no rebound or guarding  Extremities: no clubbing, cyanosis, or edema  Other: chest wound-c/d/i    Meds:  Medications were reviewed:    Labs:     POCT - GLUCOSE WHOLE BLOOD   Date/Time Value Ref Range Status   03/10/2014 1119 185* 70 - 100 mg/dL Final   56/21/3086 5784 87 70 - 100 mg/dL Final   69/62/9528 4132 150* 70 - 100 mg/dL Final   44/03/270 5366 151* 70 - 100 mg/dL Final   44/05/4740 5956 100 70 - 100 mg/dL Final       HEMOGLOBIN A1C (%)   Date Value   03/03/2014 7.5*       Recent Labs      03/09/14   0608  03/08/14   0358   WBC  8.32  7.35   HEMOGLOBIN  8.8*  8.5*   HEMATOCRIT  30.1*  28.9*   PLATELETS  181  170       Recent Labs      03/10/14   0541  03/09/14   0608  03/08/14   0358   SODIUM  142  142  139   POTASSIUM  4.9  4.4  4.7   CHLORIDE  111  112*  111   CO2  24  22  20*   BUN  32.0*  32.0*  39.0*   CREATININE  1.6*  1.6*  1.9*   GLUCOSE  88  62*  92   CALCIUM  8.2*  8.1*  8.1*   MAGNESIUM   --   2.3  2.3   PHOSPHORUS   --   2.4  2.6       No results for input(s): AST, ALT, ALKPHOS, PROT, ALB in the last 72 hours.    No results for input(s): PTT, PT, INR in the last 72 hours.      Signed by: Raynelle Bring, MD

## 2014-03-10 NOTE — Plan of Care (Signed)
Problem: Pain  Goal: Patient's pain/discomfort is manageable  Outcome: Progressing    Pt AxOx3, SBP elevated to 160s this morning, but controlled with scheduled Coreg, Novasc and Hydralazine.  SBP 130-140s.  SR 70s on tele.  Chest tube removed this morning.  O2 sat stable on room air.  Encouraged pt to use IS. RLE angiogram cancelled.  Resumed to consistenet diet.  Voided without difficulty and one soft BM this afternoon.  Ulcer on right foot.  Dressing changed per order.  Pt able to bear weight but with limited movement on right lower extremity.  Unsteady gait.  1-2x person assist to bedside commode.  Pain controlled. Floor mat in place for safety.  Continue to monitor.

## 2014-03-10 NOTE — Progress Notes (Addendum)
Case discussed with nephrology, unless this is an urgent procedure they do not want her kidney to take a third hit with IV contrast.     Patient can f/u as an outpatient for scheduled angiogram with possible balloon/stent. If patient remains for the next few days and AKI resolves, would prefer to do angio as inpatient as she has tissue loss.     Discussed with Dr. Vanetta Mulders.   Information in chart   Continue wound care     Jacqualine Mau, MD.   PGY-2 General Surgery  # [phone]

## 2014-03-10 NOTE — Progress Notes (Signed)
[facility], [employer].  Progress Note    Assessment:    -AKI likely due to hemodynamics, CABG, and sue of IV contrast at OSH; serum creatinine stable at 1.6 mg/dL;  metabolically stable   -HTN with CKD; not controlled   -Anemia with CKD  -CKD III;  -3 vessels CAD; s/p cath and CABG     Plan:    -supportive care   -raise coreg to 25 mg bid  -labs   -avoid nephrotoxins   -ESA    Burnis Medin, MD  Office - [phone]  [employer] - [phone]  ++++++++++++++++++++++++++++++++++++++++++++++++++++++++++++++  Subjective:  No new complaints    Medications:  Scheduled Meds:  Current Facility-Administered Medications   Medication Dose Route Frequency   . amiodarone  200 mg Oral Daily   . amLODIPine  10 mg Oral Daily   . aspirin  325 mg Oral Daily   . atorvastatin  10 mg Oral QHS   . cadexomer iodine   Topical Daily   . carvedilol  12.5 mg Oral Q12H SCH   . darbepoetin alfa (ARANESP) injection  12.5 mcg Subcutaneous Weekly   . fluticasone  2 puff Inhalation BID   . furosemide  20 mg Oral Daily   . gabapentin  600 mg Oral Q12H SCH   . heparin (porcine)  5,000 Units Subcutaneous Q12H [facility]   . hydrALAZINE  50 mg Oral Q12H   . insulin aspart  2 Units Subcutaneous QAM W/BREAKFAST   . insulin aspart  2 Units Subcutaneous Daily with lunch   . insulin aspart  2 Units Subcutaneous Daily with dinner   . insulin glargine  8 Units Subcutaneous QAM   . polyethylene glycol  17 g Oral Daily   . senna-docusate  2 tablet Oral QHS   . sodium bicarbonate  650 mg Oral TID   . venlafaxine  75 mg Oral Q12H SCH     Continuous Infusions:   PRN Meds:acetaminophen, albuterol-ipratropium, bisacodyl, dextrose, glucagon (rDNA), insulin aspart, ondansetron, oxyCODONE-acetaminophen, traMADol    Objective:  Vital signs in last 24 hours:  Temp:  [97.9 F (36.6 C)-98.5 F (36.9 C)] 98.2 F (36.8 C)  Heart Rate:  [69-76] 70  Resp Rate:  [16-18] 18  BP: (148-168)/(67-77) 167/77 mmHg  Intake/Output last 24 hours:    Intake/Output  Summary (Last 24 hours) at 03/10/14 0940  Last data filed at 03/10/14 0600   Gross per 24 hour   Intake   1295 ml   Output   2470 ml   Net  -1175 ml     Intake/Output this shift:       Physical Exam:   Gen: NAD   CV: S1 S2    Chest: CTAB   Ab: ND NT    Ext: No C/E    Labs:    Recent Labs  Lab 03/10/14  0541 03/09/14  0608 03/08/14  0358 03/07/14  0437  03/05/14  0150   GLUCOSE 88 62* 92 110*  < > 112*   BUN 32.0* 32.0* 39.0* 38.0*  < > 25.0*   CREATININE 1.6* 1.6* 1.9* 2.2*  < > 1.8*   CALCIUM 8.2* 8.1* 8.1* 8.2*  < > 8.5   SODIUM 142 142 139 138  < > 137   POTASSIUM 4.9 4.4 4.7 4.9  < > 4.2   CHLORIDE 111 112* 111 109  < > 108   CO2 24 22 20* 21*  < > 23   ALBUMIN  --   --   --   --   --  3.0*   PHOSPHORUS  --  2.4 2.6 4.0  --   --    MAGNESIUM  --  2.3 2.3 2.5  < > 2.7*   < > = values in this interval not displayed.    Recent Labs  Lab 03/09/14  0608 03/08/14  0358 03/07/14  0437   WBC 8.32 7.35 7.57   HEMOGLOBIN 8.8* 8.5* 8.3*   HEMATOCRIT 30.1* 28.9* 28.7*   MCV 88.5 88.7 90.0   MCH, POC 25.9* 26.1* 26.0*   MCHC 29.2* 29.4* 28.9*   RDW 21* 20* 19*   MPV 10.1 10.3 11.0   PLATELETS 181 170 183

## 2014-03-11 DIAGNOSIS — N183 Chronic kidney disease, stage 3 (moderate): Secondary | ICD-10-CM

## 2014-03-11 LAB — BASIC METABOLIC PANEL
BUN: 31 mg/dL — ABNORMAL HIGH (ref 7.0–19.0)
CO2: 24 mEq/L (ref 22–29)
Calcium: 7.9 mg/dL — ABNORMAL LOW (ref 8.5–10.5)
Chloride: 110 mEq/L (ref 100–111)
Creatinine: 1.6 mg/dL — ABNORMAL HIGH (ref 0.6–1.0)
Glucose: 116 mg/dL — ABNORMAL HIGH (ref 70–100)
Potassium: 4.7 mEq/L (ref 3.5–5.1)
Sodium: 140 mEq/L (ref 136–145)

## 2014-03-11 LAB — GLUCOSE WHOLE BLOOD - POCT
Whole Blood Glucose POCT: 124 mg/dL — ABNORMAL HIGH (ref 70–100)
Whole Blood Glucose POCT: 122 mg/dL — ABNORMAL HIGH (ref 70–100)
Whole Blood Glucose POCT: 109 mg/dL — ABNORMAL HIGH (ref 70–100)
Whole Blood Glucose POCT: 179 mg/dL — ABNORMAL HIGH (ref 70–100)
Whole Blood Glucose POCT: 157 mg/dL — ABNORMAL HIGH (ref 70–100)

## 2014-03-11 LAB — GFR: EGFR: 39.2

## 2014-03-11 MED ORDER — INSULIN ASPART 100 UNIT/ML SC SOLN
5.00 [IU] | Freq: Every day | SUBCUTANEOUS | Status: DC
Start: 2014-03-12 — End: 2014-03-14
  Administered 2014-03-12 – 2014-03-13 (×2): 5 [IU] via SUBCUTANEOUS

## 2014-03-11 NOTE — Plan of Care (Signed)
Assumed care of patient at 88. Patient is alert and orient x4. Vitals stable. SR on telemetry.  O2 sats 93% on RA. Adequate urine output. OOB with 1/2 person assist. Right foot ulcer covered with gauze and ace wrap. Ordered for daily dressing changes. Midsternal and left leg incision c/d/i and OTA. Pain controlled on PRN tramadol. Will continue to monitor and notify PA of changes in status.

## 2014-03-11 NOTE — Progress Notes (Signed)
03/11/14 2125   Patient Assessment   Status Completed   RT Priority 3   Patient on protocol Yes   Pulmonary History COPD   Surgical Status None   Mobility Ambulatory with or without assistance   Cough Spontaneous   Heart Rate 69   Resp Rate 16   Bilateral Breath Sounds Diminished   R Breath Sounds Diminished   L Breath Sounds Diminished   Location Specific No   Cough Description   Sputum Amount None   Sputum Color None   Sputum Consistency None   Home regimen   Home Treatments y   Home Oxygen n   Home CPAP/BiLevel n   Oxygen Therapy   SpO2 94 %   O2 Device None (Room air)   Criteria for Therapy Pathway   BPH None   Lung Expansion None   Respiratory Medications Patient receives maitenance therapy at home   Oxygen None   Severity Index   Severity Index MILD   Goals achieved   Respiratory medication goals Continuance of home regimen without exacerbation   Recommended Changes to plan of care   BPH N/A   Lung Expansion None   Respiratory medications None   Oxygen None   Outcomes / Recommendations   Improvements after med therapy NA   Improvements after BPH therapy NA   Improvements after LE therapy NA   Does Pt. meet MDI/DPI criteria Yes   Patient meets self admin criteria Gives return demonstration;Understands hazards and adverse reactions   Barrier(s) to therapy None   Performing Departments   O2 Device performing department formula [id number]   Resp assess adult performing department formula [id number]   MDI Treatment performing department formula [id number]   pt changed to self administered.

## 2014-03-11 NOTE — Progress Notes (Signed)
[facility] [employer].  Progress Note    Assessment:    -AKI likely due to hemodynamics, CABG, and sue of IV contrast at OSH; serum creatinine stable at 1.6 mg/dL; metabolically stable   -HTN with CKD; not controlled   -Anemia with CKD  -CKD III;  -3 vessels CAD; s/p cath and CABG     Plan:    -supportive care   -raise coreg to 25 mg bid  -labs   -avoid nephrotoxins   -ESA  -please delay LE angiogram unless it's an emergent procedure.  We should allow her renal function to recover before subjecting her to another insult to the kidney with IV contrast.      Burnis Medin, MD  Office - [phone]  [employer] - [phone]  ++++++++++++++++++++++++++++++++++++++++++++++++++++++++++++++  Subjective:  No new complaints    Medications:  Scheduled Meds:  Current Facility-Administered Medications   Medication Dose Route Frequency   . amiodarone  200 mg Oral Daily   . amLODIPine  10 mg Oral Daily   . aspirin  325 mg Oral Daily   . atorvastatin  10 mg Oral QHS   . cadexomer iodine   Topical Daily   . carvedilol  12.5 mg Oral Q12H SCH   . darbepoetin alfa (ARANESP) injection  12.5 mcg Subcutaneous Weekly   . fluticasone  2 puff Inhalation BID   . furosemide  20 mg Oral Daily   . gabapentin  600 mg Oral Q12H SCH   . heparin (porcine)  5,000 Units Subcutaneous Q12H [facility]   . hydrALAZINE  50 mg Oral Q12H   . insulin aspart  3 Units Subcutaneous Daily with dinner   . insulin aspart  3 Units Subcutaneous Daily with lunch   . insulin aspart  3 Units Subcutaneous QAM W/BREAKFAST   . polyethylene glycol  17 g Oral Daily   . senna-docusate  2 tablet Oral QHS   . sodium bicarbonate  650 mg Oral TID   . venlafaxine  75 mg Oral Q12H SCH     Continuous Infusions:   PRN Meds:acetaminophen, albuterol-ipratropium, bisacodyl, dextrose, glucagon (rDNA), insulin aspart, ondansetron, oxyCODONE-acetaminophen, traMADol    Objective:  Vital signs in last 24 hours:  Temp:  [97.6 F (36.4 C)-98.3 F (36.8 C)] 98.3 F (36.8  C)  Heart Rate:  [73-80] 73  Resp Rate:  [18] 18  BP: (128-153)/(60-68) 139/65 mmHg  Intake/Output last 24 hours:    Intake/Output Summary (Last 24 hours) at 03/11/14 0923  Last data filed at 03/11/14 0329   Gross per 24 hour   Intake    950 ml   Output   1550 ml   Net   -600 ml     Intake/Output this shift:       Physical Exam:   Gen: NAD   CV: S1 S2    Chest: CTAB   Ab: ND NT    Ext: No C/E    Labs:    Recent Labs  Lab 03/11/14  0415 03/10/14  0541 03/09/14  0608 03/08/14  0358 03/07/14  0437  03/05/14  0150   GLUCOSE 116* 88 62* 92 110*  < > 112*   BUN 31.0* 32.0* 32.0* 39.0* 38.0*  < > 25.0*   CREATININE 1.6* 1.6* 1.6* 1.9* 2.2*  < > 1.8*   CALCIUM 7.9* 8.2* 8.1* 8.1* 8.2*  < > 8.5   SODIUM 140 142 142 139 138  < > 137   POTASSIUM 4.7 4.9 4.4 4.7  4.9  < > 4.2   CHLORIDE 110 111 112* 111 109  < > 108   CO2 24 24 22  20* 21*  < > 23   ALBUMIN  --   --   --   --   --   --  3.0*   PHOSPHORUS  --   --  2.4 2.6 4.0  --   --    MAGNESIUM  --   --  2.3 2.3 2.5  < > 2.7*   < > = values in this interval not displayed.    Recent Labs  Lab 03/09/14  0608 03/08/14  0358 03/07/14  0437   WBC 8.32 7.35 7.57   HEMOGLOBIN 8.8* 8.5* 8.3*   HEMATOCRIT 30.1* 28.9* 28.7*   MCV 88.5 88.7 90.0   MCH, POC 25.9* 26.1* 26.0*   MCHC 29.2* 29.4* 28.9*   RDW 21* 20* 19*   MPV 10.1 10.3 11.0   PLATELETS 181 170 183

## 2014-03-11 NOTE — Progress Notes (Signed)
CARDIAC SURGERY CVSD/CTU PROGRESS NOTE  POD: 7    SURGERY: CABGx2    EF: 45%    SURGEON: Austin Miles, MD    ASSESSMENT AND PLAN:  Active Problems:  NSTEMI (non-ST elevated myocardial infarction)  Hypertensive urgency  PAD (peripheral artery disease)  Arterial leg ulcer  Chronic obstructive pulmonary disease, unspecified COPD type  Ischemic cardiomyopathy  CAD (coronary artery disease)  Diabetes  Chronic kidney disease    NEURO:   Good pain control on PO meds   PT/OT/Ambulation: recs acute rehab at discharge    CARDIAC:   HD stable in SR   Hypertension: improved control on Coreg/hydralazine/Norvasc, holding home ACE-I due to AKI   PAD: placement of right iliac artery stent postponed as it will require IV contrast dye and she is recovering from AKI on CKD, will be done as an outpatient   Pacing wires: out   Core Measures: ASA: Y; Beta blocker: Y; ACE: N, AKI; Statin: yes    PULMONARY:   On RA   Chest tubes: removed   Chest xray: pa/lat pending  GI:   Ok PO, +flatus, +BM    RENAL:   AKI on CKD: baseline CR 1.3-1.5, stable at 1.6   Resume home Lasix 20'  HEME:   H&H trending up   DVT Prophylaxis: SCDs, [employer]    ID:   Afebrile   Wound Care following preop RLE wound   Cultures: none  ENDO:    DM: A1c 7.5, Endocrine following, continue Lantus/Novolog    DISPOSITION:    D/c to acute rehab when bed available.       SUBJECTIVE:     No complaints    OBJECTIVE:    VITALS LAST 24 HOURS:  Temp:  [97.6 F (36.4 C)-98.3 F (36.8 C)] 98.3 F (36.8 C)  Heart Rate:  [68-80] 68  Resp Rate:  [18] 18  BP: (128-153)/(60-68) 139/65 mmHg  O2 saturation: 93-95% on RA  Rhythm: NSR    PHYSICAL EXAM:     Neuro: awake and interactive   Cardiac: RRR   Lungs:  Good aeration bilat   Abdomen: BS+ soft   Extremities: warm, minimal edema   Incisions: healing well    RECENT LABS:    CBC:  Recent Labs      03/09/14   0608   WBC  8.32   HEMOGLOBIN  8.8*   HEMATOCRIT  30.1*       BMP:  Recent Labs      03/11/14   0415  03/10/14    0541  03/09/14   0608   SODIUM  140  142  142   POTASSIUM  4.7  4.9  4.4   CHLORIDE  110  111  112*   CO2  24  24  22    BUN  31.0*  32.0*  32.0*   CREATININE  1.6*  1.6*  1.6*   GLUCOSE  116*  88  62*   MAGNESIUM   --    --   2.3   CALCIUM  7.9*  8.2*  8.1*   PHOSPHORUS   --    --   2.4       LFTs:  No results for input(s): AST, ALT, ALKPHOS, BILITOTAL, BILIDIRECT, BILIINDIRECT, PROT, ALB in the last 72 hours.    INR:  No results for input(s): PT, INR in the last 72 hours.      MEDICATIONS:    Current Facility-Administered Medications   Medication Dose Route Frequency   .  amiodarone  200 mg Oral Daily   . amLODIPine  10 mg Oral Daily   . aspirin  325 mg Oral Daily   . atorvastatin  10 mg Oral QHS   . cadexomer iodine   Topical Daily   . carvedilol  12.5 mg Oral Q12H SCH   . darbepoetin alfa (ARANESP) injection  12.5 mcg Subcutaneous Weekly   . fluticasone  2 puff Inhalation BID   . furosemide  20 mg Oral Daily   . gabapentin  600 mg Oral Q12H SCH   . heparin (porcine)  5,000 Units Subcutaneous Q12H [facility]   . hydrALAZINE  50 mg Oral Q12H   . insulin aspart  3 Units Subcutaneous Daily with dinner   . insulin aspart  3 Units Subcutaneous Daily with lunch   . insulin aspart  3 Units Subcutaneous QAM W/BREAKFAST   . polyethylene glycol  17 g Oral Daily   . senna-docusate  2 tablet Oral QHS   . sodium bicarbonate  650 mg Oral TID   . venlafaxine  75 mg Oral Q12H SCH         INTAKE/OUTPUT:     Intake and Output Summary (Last 24 hours) at Date Time        Intake/Output Summary (Last 24 hours) at 03/11/14 1103  Last data filed at 03/11/14 0329   Gross per 24 hour   Intake    950 ml   Output   1550 ml   Net   -600 ml                    Karie Mainland, PA-C

## 2014-03-11 NOTE — Plan of Care (Addendum)
Problem: Post-op Cardiac Surgery  Goal: Nutritional intake is adequate  Outcome: Not Progressing  Patient refuses to have her breakfast. She is afraid to have her blood sugars to be high. She was instructed to have proteins to help healing her incisions. She was encouraged to fill up her [employer]. Will assess her intake with every meal. Because the ulcer on her right heel, she is very limited to ambulate. This right leg hurts constantly. She was given the IV pole to hold while walking in her room. She received Percocet 1 tab. Po.for pain. She is more active today. Telemetry NSR. Lungs sounds decreased at bases. Non productiPatient needs 2 people to transfer and to use bedside commode. She is not complaint with treatment..ve cough. Chest incision clean and intact. Refuses to have stool softeners. Encouraged to use incentive spirometer.    FALL PRECAUTION;  Patient needs two people to transfer, she uses IV pole to help her walk in room. Encourage incentive spirometer. Needs to be more active with her activity.   She refuses floor mat, because is more difficult to walk on the floor mat.. Falls precautions on progress.

## 2014-03-12 LAB — BASIC METABOLIC PANEL
BUN: 28 mg/dL — ABNORMAL HIGH (ref 7.0–19.0)
CO2: 25 mEq/L (ref 22–29)
Calcium: 8.2 mg/dL — ABNORMAL LOW (ref 8.5–10.5)
Chloride: 110 mEq/L (ref 100–111)
Creatinine: 1.5 mg/dL — ABNORMAL HIGH (ref 0.6–1.0)
Glucose: 104 mg/dL — ABNORMAL HIGH (ref 70–100)
Potassium: 5 mEq/L (ref 3.5–5.1)
Sodium: 140 mEq/L (ref 136–145)

## 2014-03-12 LAB — URINALYSIS WITH MICROSCOPIC
Bilirubin, UA: NEGATIVE
Blood, UA: NEGATIVE
Glucose, UA: NEGATIVE
Ketones UA: NEGATIVE
Nitrite, UA: NEGATIVE
Protein, UR: 100 — AB
Specific Gravity UA: 1.01 (ref 1.001–1.035)
Urine pH: 7 (ref 5.0–8.0)
Urobilinogen, UA: NORMAL mg/dL

## 2014-03-12 LAB — GLUCOSE WHOLE BLOOD - POCT
Whole Blood Glucose POCT: 104 mg/dL — ABNORMAL HIGH (ref 70–100)
Whole Blood Glucose POCT: 153 mg/dL — ABNORMAL HIGH (ref 70–100)
Whole Blood Glucose POCT: 90 mg/dL (ref 70–100)
Whole Blood Glucose POCT: 94 mg/dL (ref 70–100)

## 2014-03-12 LAB — GFR: EGFR: 42.2

## 2014-03-12 MED ORDER — CARVEDILOL 6.25 MG PO TABS
12.50 mg | ORAL_TABLET | Freq: Once | ORAL | Status: AC
Start: 2014-03-12 — End: 2014-03-12
  Administered 2014-03-12: 12.5 mg via ORAL
  Filled 2014-03-12: qty 2

## 2014-03-12 MED ORDER — CARVEDILOL 6.25 MG PO TABS
25.0000 mg | ORAL_TABLET | Freq: Two times a day (BID) | ORAL | Status: DC
Start: 2014-03-12 — End: 2014-03-14
  Administered 2014-03-12 – 2014-03-14 (×4): 25 mg via ORAL
  Filled 2014-03-12 (×4): qty 4

## 2014-03-12 NOTE — Progress Notes (Signed)
CARDIAC SURGERY CVSD/CTU PROGRESS NOTE    POD: 8    SURGERY: CABGx2    EF: 45%    SURGEON: Austin Miles, MD    ASSESSMENT AND PLAN:  Active Problems:    NSTEMI (non-ST elevated myocardial infarction)    Hypertensive urgency    PAD (peripheral artery disease)    Arterial leg ulcer    Chronic obstructive pulmonary disease, unspecified COPD type    Ischemic cardiomyopathy    CAD (coronary artery disease)    Diabetes    Chronic kidney disease    NEURO:   Good pain control on PO meds   PT/OT/Ambulation: recs acute rehab at discharge    CARDIAC:   HD stable in SR   Hypertension: improved control on Coreg/hydralazine/Norvasc, increased Coreg 25'', holding home ACE-I due to K 5.0 today   PAD: placement of right iliac artery stent postponed as it will require IV contrast dye and she is recovering from AKI on CKD, will be done as an outpatient   Core Measures: ASA: Y; Beta blocker: Y; ACE: N, AKI; Statin: yes    PULMONARY:   On RA        GI:   Ok PO, +flatus, +BM    RENAL:   AKI on CKD: at baseline CR (1.3-1.5)   On home Lasix 20'     HEME:   DVT Prophylaxis: SCDs, [facility]    ID:   Afebrile   Wound Care following preop RLE wound   Cultures: none     ENDO:    DM: A1c 7.5, Endocrine following, off Lantus d/t decrease insulin requirement, continue Novolog    DISPOSITION:    D/c to acute rehab when bed available and prior authorization attained.    SUBJECTIVE:     Feels pretty good.  No new complaints.  No overnight events.    OBJECTIVE:  VITALS LAST 24 HOURS:  Temp:  [97.8 F (36.6 C)-98.4 F (36.9 C)] 98.3 F (36.8 C)  Heart Rate:  [62-75] 74  Resp Rate:  [16-18] 18  BP: (132-147)/(63-65) 147/65 mmHg  O2 saturation: 92-96% on RA    PHYSICAL EXAM:  General: in chair, good affect  Lungs: ctab  Heart: RRR, I-II/VI SEM heard best at the right sternoclavicular border, s1/s2 present, no m/r/g  Abd: nd, +bs, soft, nt  Neuro: no focal deficits  Extremities: no edema  Incisions: c/d/i  T/L/D: PIV    RECENT  LABS:  Results     Procedure Component Value Units Date/Time    Glucose Whole Blood - POCT [[id number]]  (Abnormal) Collected:  03/12/14 0605     POCT - Glucose Whole blood 104 (H) mg/dL Updated:  95/28/41 3244    Urinalysis with microscopic [[id number]]  (Abnormal) Collected:  03/12/14 0353    Specimen Information:  Urine Updated:  03/12/14 0450     Urine Type Clean Catch      Color, UA Yellow      Clarity, UA Clear      Specific Gravity UA 1.010      Urine pH 7.0      Leukocyte Esterase, UA Large (A)      Nitrite, UA Negative      Protein, UR 100 (A)      Glucose, UA Negative      Ketones UA Negative      Urobilinogen, UA Normal mg/dL      Bilirubin, UA Negative      Blood, UA Negative      RBC,  UA 0 - 5 /hpf      WBC, UA 26 - 50 (A) /hpf      Squamous Epithelial Cells, Urine 6 - 10 /hpf      Trans Epithel, UA 0 - 2 /hpf     GFR [[id number]] Collected:  03/12/14 0341     EGFR 42.2 Updated:  03/12/14 0434    Basic Metabolic Panel [[id number]]  (Abnormal) Collected:  03/12/14 0341    Specimen Information:  Blood Updated:  03/12/14 0434     Glucose 104 (H) mg/dL      BUN 78.2 (H) mg/dL      Creatinine 1.5 (H) mg/dL      CALCIUM 8.2 (L) mg/dL      Sodium 956 mEq/L      Potassium 5.0 mEq/L      Chloride 110 mEq/L      CO2 25 mEq/L     Glucose Whole Blood - POCT [[id number]]  (Abnormal) Collected:  03/11/14 2154     POCT - Glucose Whole blood 157 (H) mg/dL Updated:  46/96/29 5284    Glucose Whole Blood - POCT [[id number]]  (Abnormal) Collected:  03/11/14 1607     POCT - Glucose Whole blood 179 (H) mg/dL Updated:  72/53/66 4403    Glucose Whole Blood - POCT [[id number]]  (Abnormal) Collected:  03/11/14 1108     POCT - Glucose Whole blood 109 (H) mg/dL Updated:  87/56/43 3295        MEDICATIONS:  Current Facility-Administered Medications   Medication Dose Route Frequency   . amiodarone  200 mg Oral Daily   . amLODIPine  10 mg Oral Daily   . aspirin  325 mg Oral Daily   . atorvastatin  10 mg Oral QHS   . cadexomer iodine    Topical Daily   . carvedilol  12.5 mg Oral Once   . carvedilol  25 mg Oral Q12H [facility]   . darbepoetin alfa (ARANESP) injection  12.5 mcg Subcutaneous Weekly   . fluticasone  2 puff Inhalation BID   . furosemide  20 mg Oral Daily   . gabapentin  600 mg Oral Q12H SCH   . heparin (porcine)  5,000 Units Subcutaneous Q12H [facility]   . hydrALAZINE  50 mg Oral Q12H   . insulin aspart  3 Units Subcutaneous QAM W/BREAKFAST   . insulin aspart  5 Units Subcutaneous Daily with dinner   . insulin aspart  5 Units Subcutaneous Daily with lunch   . polyethylene glycol  17 g Oral Daily   . senna-docusate  2 tablet Oral QHS   . sodium bicarbonate  650 mg Oral TID   . venlafaxine  75 mg Oral Q12H SCH     INTAKE/OUTPUT:  Intake and Output Summary (Last 24 hours) at Date Time    Intake/Output Summary (Last 24 hours) at 03/12/14 0930  Last data filed at 03/12/14 0300   Gross per 24 hour   Intake   1210 ml   Output   1200 ml   Net     10 ml                    Jacelyn Pi, PA-C

## 2014-03-12 NOTE — Plan of Care (Addendum)
Assumed care of patient at 1900. Patient is alert and orient x4. Vitals stable. SR on telemetry. O2 sats 93% on RA. Using bed side commode with adequate urine output. Urine malodorous and cloudy. Pt asymptomatic and afebrile. PA updated and UA ordered. Fluid intake been mostly diet coke and ginger ale. Encouraged patient to drink more water and avoid sodas. OOB with 1/2 person assist. Unsteady on feet. Mobility limitation mostly due to pain caused by right foot ulcer. Ulcer covered with gauze and ace wrap. Ordered for daily dressing changes. Ointment in patient's bathroom. Midsternal and left leg incision c/d/i and OTA. Complained of incisional  pain 4-6/10. PRN tramadol and oxycodone administered. Reported improvement in pain control after oxycodone administration. Safety and fall precautions maintained. Will continue to monitor and notify PA of changes in status.

## 2014-03-12 NOTE — Progress Notes (Signed)
SW spoke with PA Adam today re: d/c of pt. Pt is ready to go when AR bed available. Reached out to [facility] (contact: Cordelia Pen and Damian Leavell), was informed that insurance Berk[employer] Harvey has not been started on this pt and pt's insurance is closed due to holiday weekend. They will start auth on Monday. Unit SW will follow up with [facility] on Monday.     Yehuda Budd, LCSW  Case Management Department  [facility]  [phone]

## 2014-03-12 NOTE — Plan of Care (Addendum)
Problem: Pain  Goal: Patient's pain/discomfort is manageable  Outcome: Progressing    Patient A&Ox3. Monitor shows normal sinus rhythm. Patient on RA sating 94-98%. Voiding adequate amounts of clear, yellow urine in the bedside commode. OOB with 1-2 person assist, patient ambulates in the room. Midsternal incision OTA, CDI. RLE ulcer present, dressing changed daily with iodosorb gel per protocol. Dressing changed this AM. Patient's pain controlled with PO percocet PRN. Patient sitting comfortably in chair, call bell within reach. Will continue to monitor and report any changes.

## 2014-03-12 NOTE — Progress Notes (Signed)
[facility], [employer].  Progress Note    Assessment:    -AKI likely due to hemodynamics, CABG, and sue of IV contrast at OSH; serum creatinine stable at 1.5 mg/dL; metabolically stable   -HTN with CKD; better controlled   -Anemia with CKD  -CKD III;  -3 vessels CAD; s/p cath and CABG     Plan:    -supportive care   -labs   -avoid nephrotoxins   -ESA  -please delay LE angiogram unless it's an emergent procedure. We should allow her renal function to recover before subjecting her to another insult to the kidney with IV contrast.     Burnis Medin, MD  Office - [phone]  [employer] - [phone]  ++++++++++++++++++++++++++++++++++++++++++++++++++++++++++++++  Subjective:  No new complaints    Medications:  Scheduled Meds:  Current Facility-Administered Medications   Medication Dose Route Frequency   . amiodarone  200 mg Oral Daily   . amLODIPine  10 mg Oral Daily   . aspirin  325 mg Oral Daily   . atorvastatin  10 mg Oral QHS   . cadexomer iodine   Topical Daily   . carvedilol  25 mg Oral Q12H SCH   . darbepoetin alfa (ARANESP) injection  12.5 mcg Subcutaneous Weekly   . fluticasone  2 puff Inhalation BID   . furosemide  20 mg Oral Daily   . gabapentin  600 mg Oral Q12H SCH   . heparin (porcine)  5,000 Units Subcutaneous Q12H [facility]   . hydrALAZINE  50 mg Oral Q12H   . insulin aspart  3 Units Subcutaneous QAM W/BREAKFAST   . insulin aspart  5 Units Subcutaneous Daily with dinner   . insulin aspart  5 Units Subcutaneous Daily with lunch   . polyethylene glycol  17 g Oral Daily   . senna-docusate  2 tablet Oral QHS   . sodium bicarbonate  650 mg Oral TID   . venlafaxine  75 mg Oral Q12H SCH     Continuous Infusions:   PRN Meds:acetaminophen, albuterol-ipratropium, bisacodyl, dextrose, glucagon (rDNA), insulin aspart, ondansetron, oxyCODONE-acetaminophen, traMADol    Objective:  Vital signs in last 24 hours:  Temp:  [97.8 F (36.6 C)-98.6 F (37 C)] 98.6 F (37 C)  Heart Rate:  [62-75]  70  Resp Rate:  [16-18] 16  BP: (132-152)/(63-68) 144/63 mmHg  Intake/Output last 24 hours:    Intake/Output Summary (Last 24 hours) at 03/12/14 1142  Last data filed at 03/12/14 1115   Gross per 24 hour   Intake   1210 ml   Output   1450 ml   Net   -240 ml     Intake/Output this shift:  I/O this shift:  In: -   Out: 250 [Urine:250]    Physical Exam:   Gen:  NAD   CV: S1 S2    Chest: CTAB   Ab: ND NT    Ext: No C/E    Labs:    Recent Labs  Lab 03/12/14  0341 03/11/14  0415 03/10/14  0541 03/09/14  0608 03/08/14  0358 03/07/14  0437   GLUCOSE 104* 116* 88 62* 92 110*   BUN 28.0* 31.0* 32.0* 32.0* 39.0* 38.0*   CREATININE 1.5* 1.6* 1.6* 1.6* 1.9* 2.2*   CALCIUM 8.2* 7.9* 8.2* 8.1* 8.1* 8.2*   SODIUM 140 140 142 142 139 138   POTASSIUM 5.0 4.7 4.9 4.4 4.7 4.9   CHLORIDE 110 110 111 112* 111 109   CO2 25 24  24 22 20* 21*   PHOSPHORUS  --   --   --  2.4 2.6 4.0   MAGNESIUM  --   --   --  2.3 2.3 2.5       Recent Labs  Lab 03/09/14  0608 03/08/14  0358 03/07/14  0437   WBC 8.32 7.35 7.57   HEMOGLOBIN 8.8* 8.5* 8.3*   HEMATOCRIT 30.1* 28.9* 28.7*   MCV 88.5 88.7 90.0   MCH, POC 25.9* 26.1* 26.0*   MCHC 29.2* 29.4* 28.9*   RDW 21* 20* 19*   MPV 10.1 10.3 11.0   PLATELETS 181 170 183

## 2014-03-13 LAB — GLUCOSE WHOLE BLOOD - POCT
Whole Blood Glucose POCT: 101 mg/dL — ABNORMAL HIGH (ref 70–100)
Whole Blood Glucose POCT: 176 mg/dL — ABNORMAL HIGH (ref 70–100)
Whole Blood Glucose POCT: 150 mg/dL — ABNORMAL HIGH (ref 70–100)
Whole Blood Glucose POCT: 101 mg/dL — ABNORMAL HIGH (ref 70–100)

## 2014-03-13 LAB — BASIC METABOLIC PANEL
BUN: 28 mg/dL — ABNORMAL HIGH (ref 7.0–19.0)
CO2: 27 mEq/L (ref 22–29)
Calcium: 8 mg/dL — ABNORMAL LOW (ref 8.5–10.5)
Chloride: 110 mEq/L (ref 100–111)
Creatinine: 1.7 mg/dL — ABNORMAL HIGH (ref 0.6–1.0)
Glucose: 108 mg/dL — ABNORMAL HIGH (ref 70–100)
Potassium: 5.1 mEq/L (ref 3.5–5.1)
Sodium: 141 mEq/L (ref 136–145)

## 2014-03-13 LAB — GFR: EGFR: 36.5

## 2014-03-13 NOTE — Progress Notes (Signed)
CARDIAC SURGERY CVSD/CTU PROGRESS NOTE    POD: 9    SURGERY: CABGx2    EF: 45%    SURGEON: Austin Miles, MD    ASSESSMENT AND PLAN:  Active Problems:    NSTEMI (non-ST elevated myocardial infarction)    Hypertensive urgency    PAD (peripheral artery disease)    Arterial leg ulcer    Chronic obstructive pulmonary disease, unspecified COPD type    Ischemic cardiomyopathy    CAD (coronary artery disease)    Diabetes    Chronic kidney disease    NEURO:   Good pain control on PO meds   PT/OT/Ambulation: recs acute rehab at discharge    CARDIAC:   HD stable in SR   Hypertension: improved control on Coreg/hydralazine/Norvasc, holding home ACE-I due to K 5.1 today   PAD: placement of right iliac artery stent postponed as it will require IV contrast dye and she is recovering from AKI on CKD, will be done as an outpatient   Core Measures: ASA: Y; Beta blocker: Y; ACE: N, AKI; Statin: yes    PULMONARY:   On RA        GI:   Ok PO, +flatus, +BM    RENAL:   AKI on CKD: Cr 1.7 today, baseline Cr (1.3-1.5)   On home Lasix 20'     HEME:   DVT Prophylaxis: SCDs, [facility]    ID:   Afebrile   Wound Care following preop RLE wound   Cultures: none     ENDO:    DM: A1c 7.5, Endocrine following, off Lantus d/t decrease insulin requirement, continue Novolog    DISPOSITION:    D/c to acute rehab when bed available and prior authorization attained.    SUBJECTIVE:     Feels good.  No new complaints.  No overnight events.    OBJECTIVE:  VITALS LAST 24 HOURS:  Temp:  [98.1 F (36.7 C)-98.6 F (37 C)] 98.2 F (36.8 C)  Heart Rate:  [68-75] 74  Resp Rate:  [16-18] 18  BP: (120-152)/(56-74) 122/74 mmHg  O2 saturation: 94-98% on RA    PHYSICAL EXAM:  General: in chair, good affect  Lungs: ctab  Heart: RRR, I-II/VI SEM heard best at the right sternoclavicular border, s1/s2 present, no m/r/g  Abd: nd, +bs, soft, nt  Neuro: no focal deficits  Extremities: no edema  Incisions: c/d/i  T/L/D: PIV    RECENT LABS:  Results     Procedure  Component Value Units Date/Time    Glucose Whole Blood - POCT [[id number]]  (Abnormal) Collected:  03/13/14 0553     POCT - Glucose Whole blood 101 (H) mg/dL Updated:  62/13/08 6578    Basic Metabolic Panel [[id number]]  (Abnormal) Collected:  03/13/14 0353    Specimen Information:  Blood Updated:  03/13/14 0505     Glucose 108 (H) mg/dL      BUN 41.3 (H) mg/dL      Creatinine 1.7 (H) mg/dL      CALCIUM 8.0 (L) mg/dL      Sodium 244 mEq/L      Potassium 5.1 mEq/L      Chloride 110 mEq/L      CO2 27 mEq/L     GFR [[id number]] Collected:  03/13/14 0353     EGFR 36.5 Updated:  03/13/14 0505    Glucose Whole Blood - POCT [[id number]] Collected:  03/12/14 2137     POCT - Glucose Whole blood 94 mg/dL Updated:  59/56/38 7564  Glucose Whole Blood - POCT [[id number]] Collected:  03/12/14 1626     POCT - Glucose Whole blood 90 mg/dL Updated:  40/98/11 9147    Glucose Whole Blood - POCT [[id number]]  (Abnormal) Collected:  03/12/14 1142     POCT - Glucose Whole blood 153 (H) mg/dL Updated:  86/57/84 6962        MEDICATIONS:  Current Facility-Administered Medications   Medication Dose Route Frequency   . amiodarone  200 mg Oral Daily   . amLODIPine  10 mg Oral Daily   . aspirin  325 mg Oral Daily   . atorvastatin  10 mg Oral QHS   . cadexomer iodine   Topical Daily   . carvedilol  25 mg Oral Q12H SCH   . darbepoetin alfa (ARANESP) injection  12.5 mcg Subcutaneous Weekly   . fluticasone  2 puff Inhalation BID   . furosemide  20 mg Oral Daily   . gabapentin  600 mg Oral Q12H SCH   . heparin (porcine)  5,000 Units Subcutaneous Q12H [facility]   . hydrALAZINE  50 mg Oral Q12H   . insulin aspart  3 Units Subcutaneous QAM W/BREAKFAST   . insulin aspart  5 Units Subcutaneous Daily with dinner   . insulin aspart  5 Units Subcutaneous Daily with lunch   . polyethylene glycol  17 g Oral Daily   . senna-docusate  2 tablet Oral QHS   . sodium bicarbonate  650 mg Oral TID   . venlafaxine  75 mg Oral Q12H SCH     INTAKE/OUTPUT:  Intake and Output  Summary (Last 24 hours) at Date Time    Intake/Output Summary (Last 24 hours) at 03/13/14 1027  Last data filed at 03/13/14 0802   Gross per 24 hour   Intake    855 ml   Output   1550 ml   Net   -695 ml                    Jacelyn Pi, PA-C

## 2014-03-13 NOTE — Plan of Care (Signed)
Problem: Pain  Goal: Patient's pain/discomfort is manageable  Outcome: Progressing    Patient A&Ox3, VSS. Monitor shows normal sinus rhythm. Patient on RA sating 95%. OOB with moderate assist, patient ambulates in the room to the bedside commode. Midsternal incision OTA, CDI. Iodosorb gel applied to RLE ulcer, covered with telfa, gauze, and ace bandage. Dressing changed this AM per order, CDI. Patient complaining of mild pain in her right leg but refusing pain medications at this time. Bed in lowest position, call bell within reach. Will continue to monitor and report any changes.

## 2014-03-13 NOTE — Plan of Care (Signed)
Assumed care of patient at 1900. Patient is alert and orient x4. Vitals stable. SR on telemetry. O2 sats 93% on RA. Using bed side commode with adequate urine output. OOB with 1/2 person assist. Unsteady on feet. Mobility limitation mostly due to pain caused by right foot ulcer. Ulcer covered with gauze and ace wrap. Ordered for daily dressing changes. Ointment in patient's bathroom. Midsternal and left leg incision c/d/i and OTA. Incisional pain controlled with PRN tramadol and percocet. Safety and fall precautions maintained. Will continue to monitor and notify PA of changes in status.

## 2014-03-13 NOTE — Progress Notes (Signed)
[facility], [employer].  Progress Note    Assessment:    -AKI likely due to hemodynamics, CABG, and sue of IV contrast at OSH; serum creatinine 1.7 mg/dL; metabolically stable   -HTN with CKD; better controlled   -Anemia with CKD  -CKD III;  -3 vessels CAD; s/p cath and CABG     Plan:    -supportive care   -labs   -avoid nephrotoxins   -ESA  -please delay LE angiogram unless it's an emergent procedure. We should allow her renal function to recover before subjecting her to another insult to the kidney with IV contrast.     Burnis Medin, MD  Office - [phone]  [employer] - [phone]  ++++++++++++++++++++++++++++++++++++++++++++++++++++++++++++++  Subjective:  No new complaints    Medications:  Scheduled Meds:  Current Facility-Administered Medications   Medication Dose Route Frequency   . amiodarone  200 mg Oral Daily   . amLODIPine  10 mg Oral Daily   . aspirin  325 mg Oral Daily   . atorvastatin  10 mg Oral QHS   . cadexomer iodine   Topical Daily   . carvedilol  25 mg Oral Q12H SCH   . darbepoetin alfa (ARANESP) injection  12.5 mcg Subcutaneous Weekly   . fluticasone  2 puff Inhalation BID   . furosemide  20 mg Oral Daily   . gabapentin  600 mg Oral Q12H SCH   . heparin (porcine)  5,000 Units Subcutaneous Q12H [facility]   . hydrALAZINE  50 mg Oral Q12H   . insulin aspart  3 Units Subcutaneous QAM W/BREAKFAST   . insulin aspart  5 Units Subcutaneous Daily with dinner   . insulin aspart  5 Units Subcutaneous Daily with lunch   . polyethylene glycol  17 g Oral Daily   . senna-docusate  2 tablet Oral QHS   . sodium bicarbonate  650 mg Oral TID   . venlafaxine  75 mg Oral Q12H SCH     Continuous Infusions:   PRN Meds:acetaminophen, albuterol-ipratropium, bisacodyl, dextrose, glucagon (rDNA), insulin aspart, ondansetron, oxyCODONE-acetaminophen, traMADol    Objective:  Vital signs in last 24 hours:  Temp:  [98.1 F (36.7 C)-98.6 F (37 C)] 98.2 F (36.8 C)  Heart Rate:  [68-75] 74  Resp Rate:   [16-18] 18  BP: (120-152)/(56-74) 122/74 mmHg  Intake/Output last 24 hours:    Intake/Output Summary (Last 24 hours) at 03/13/14 1040  Last data filed at 03/13/14 0802   Gross per 24 hour   Intake    855 ml   Output   1550 ml   Net   -695 ml     Intake/Output this shift:  I/O this shift:  In: -   Out: 350 [Urine:350]    Physical Exam:   Gen: NAD   CV: S1 S2    Chest: CTAB   Ab: ND NT    Ext: No C/E    Labs:    Recent Labs  Lab 03/13/14  0353 03/12/14  0341 03/11/14  0415  03/09/14  0608 03/08/14  0358 03/07/14  0437   GLUCOSE 108* 104* 116*  < > 62* 92 110*   BUN 28.0* 28.0* 31.0*  < > 32.0* 39.0* 38.0*   CREATININE 1.7* 1.5* 1.6*  < > 1.6* 1.9* 2.2*   CALCIUM 8.0* 8.2* 7.9*  < > 8.1* 8.1* 8.2*   SODIUM 141 140 140  < > 142 139 138   POTASSIUM 5.1 5.0 4.7  < >  4.4 4.7 4.9   CHLORIDE 110 110 110  < > 112* 111 109   CO2 27 25 24   < > 22 20* 21*   PHOSPHORUS  --   --   --   --  2.4 2.6 4.0   MAGNESIUM  --   --   --   --  2.3 2.3 2.5   < > = values in this interval not displayed.    Recent Labs  Lab 03/09/14  0608 03/08/14  0358 03/07/14  0437   WBC 8.32 7.35 7.57   HEMOGLOBIN 8.8* 8.5* 8.3*   HEMATOCRIT 30.1* 28.9* 28.7*   MCV 88.5 88.7 90.0   MCH, POC 25.9* 26.1* 26.0*   MCHC 29.2* 29.4* 28.9*   RDW 21* 20* 19*   MPV 10.1 10.3 11.0   PLATELETS 181 170 183

## 2014-03-14 ENCOUNTER — Inpatient Hospital Stay: Payer: BLUE CROSS/BLUE SHIELD | Admitting: Hospice and Palliative Medicine

## 2014-03-14 ENCOUNTER — Inpatient Hospital Stay
Admission: RE | Admit: 2014-03-14 | Discharge: 2014-03-25 | DRG: 949 | Disposition: A | Discharge status: Home Health | Payer: BLUE CROSS/BLUE SHIELD | Source: Other Acute Inpatient Hospital | Attending: Hospice and Palliative Medicine | Admitting: Hospice and Palliative Medicine

## 2014-03-14 ENCOUNTER — Inpatient Hospital Stay: Payer: BLUE CROSS/BLUE SHIELD

## 2014-03-14 DIAGNOSIS — L97909 Non-pressure chronic ulcer of unspecified part of unspecified lower leg with unspecified severity: Secondary | ICD-10-CM

## 2014-03-14 DIAGNOSIS — D631 Anemia in chronic kidney disease: Secondary | ICD-10-CM | POA: Diagnosis present

## 2014-03-14 DIAGNOSIS — I350 Nonrheumatic aortic (valve) stenosis: Secondary | ICD-10-CM | POA: Diagnosis present

## 2014-03-14 DIAGNOSIS — N289 Disorder of kidney and ureter, unspecified: Secondary | ICD-10-CM | POA: Diagnosis not present

## 2014-03-14 DIAGNOSIS — Z955 Presence of coronary angioplasty implant and graft: Secondary | ICD-10-CM

## 2014-03-14 DIAGNOSIS — L97319 Non-pressure chronic ulcer of right ankle with unspecified severity: Secondary | ICD-10-CM | POA: Diagnosis present

## 2014-03-14 DIAGNOSIS — E785 Hyperlipidemia, unspecified: Secondary | ICD-10-CM | POA: Diagnosis present

## 2014-03-14 DIAGNOSIS — E1122 Type 2 diabetes mellitus with diabetic chronic kidney disease: Secondary | ICD-10-CM | POA: Diagnosis present

## 2014-03-14 DIAGNOSIS — Z48812 Encounter for surgical aftercare following surgery on the circulatory system: Principal | ICD-10-CM | POA: Diagnosis present

## 2014-03-14 DIAGNOSIS — D500 Iron deficiency anemia secondary to blood loss (chronic): Secondary | ICD-10-CM | POA: Diagnosis present

## 2014-03-14 DIAGNOSIS — E8809 Other disorders of plasma-protein metabolism, not elsewhere classified: Secondary | ICD-10-CM | POA: Diagnosis present

## 2014-03-14 DIAGNOSIS — I129 Hypertensive chronic kidney disease with stage 1 through stage 4 chronic kidney disease, or unspecified chronic kidney disease: Secondary | ICD-10-CM | POA: Diagnosis present

## 2014-03-14 DIAGNOSIS — Z794 Long term (current) use of insulin: Secondary | ICD-10-CM

## 2014-03-14 DIAGNOSIS — Z87891 Personal history of nicotine dependence: Secondary | ICD-10-CM

## 2014-03-14 DIAGNOSIS — N183 Chronic kidney disease, stage 3 (moderate): Secondary | ICD-10-CM | POA: Diagnosis present

## 2014-03-14 DIAGNOSIS — Z833 Family history of diabetes mellitus: Secondary | ICD-10-CM

## 2014-03-14 DIAGNOSIS — I2510 Atherosclerotic heart disease of native coronary artery without angina pectoris: Secondary | ICD-10-CM | POA: Diagnosis present

## 2014-03-14 DIAGNOSIS — E46 Unspecified protein-calorie malnutrition: Secondary | ICD-10-CM | POA: Diagnosis present

## 2014-03-14 DIAGNOSIS — L97519 Non-pressure chronic ulcer of other part of right foot with unspecified severity: Secondary | ICD-10-CM | POA: Diagnosis present

## 2014-03-14 DIAGNOSIS — J449 Chronic obstructive pulmonary disease, unspecified: Secondary | ICD-10-CM | POA: Diagnosis present

## 2014-03-14 DIAGNOSIS — E872 Acidosis: Secondary | ICD-10-CM | POA: Diagnosis not present

## 2014-03-14 DIAGNOSIS — Z8249 Family history of ischemic heart disease and other diseases of the circulatory system: Secondary | ICD-10-CM

## 2014-03-14 DIAGNOSIS — R443 Hallucinations, unspecified: Secondary | ICD-10-CM | POA: Diagnosis not present

## 2014-03-14 DIAGNOSIS — I70235 Atherosclerosis of native arteries of right leg with ulceration of other part of foot: Secondary | ICD-10-CM | POA: Diagnosis present

## 2014-03-14 DIAGNOSIS — R5381 Other malaise: Secondary | ICD-10-CM | POA: Diagnosis present

## 2014-03-14 DIAGNOSIS — I252 Old myocardial infarction: Secondary | ICD-10-CM

## 2014-03-14 DIAGNOSIS — R600 Localized edema: Secondary | ICD-10-CM | POA: Diagnosis present

## 2014-03-14 DIAGNOSIS — E875 Hyperkalemia: Secondary | ICD-10-CM | POA: Diagnosis not present

## 2014-03-14 DIAGNOSIS — Z880 Allergy status to penicillin: Secondary | ICD-10-CM

## 2014-03-14 DIAGNOSIS — E1165 Type 2 diabetes mellitus with hyperglycemia: Secondary | ICD-10-CM

## 2014-03-14 DIAGNOSIS — Z951 Presence of aortocoronary bypass graft: Secondary | ICD-10-CM

## 2014-03-14 LAB — BASIC METABOLIC PANEL
BUN: 28 mg/dL — ABNORMAL HIGH (ref 7.0–19.0)
CO2: 26 mEq/L (ref 22–29)
Calcium: 8.4 mg/dL — ABNORMAL LOW (ref 8.5–10.5)
Chloride: 109 mEq/L (ref 100–111)
Creatinine: 1.6 mg/dL — ABNORMAL HIGH (ref 0.6–1.0)
Glucose: 103 mg/dL — ABNORMAL HIGH (ref 70–100)
Potassium: 4.9 mEq/L (ref 3.5–5.1)
Sodium: 141 mEq/L (ref 136–145)

## 2014-03-14 LAB — GLUCOSE WHOLE BLOOD - POCT
Whole Blood Glucose POCT: 92 mg/dL (ref 70–100)
Whole Blood Glucose POCT: 171 mg/dL — ABNORMAL HIGH (ref 70–100)
Whole Blood Glucose POCT: 159 mg/dL — ABNORMAL HIGH (ref 70–100)
Whole Blood Glucose POCT: 119 mg/dL — ABNORMAL HIGH (ref 70–100)

## 2014-03-14 LAB — GFR: EGFR: 39.2

## 2014-03-14 MED ORDER — AMLODIPINE BESYLATE 10 MG PO TABS
10.00 mg | ORAL_TABLET | Freq: Every day | ORAL | Status: DC
Start: 2014-03-14 — End: 2014-04-23

## 2014-03-14 MED ORDER — CARVEDILOL 12.5 MG PO TABS
25.0000 mg | ORAL_TABLET | Freq: Two times a day (BID) | ORAL | Status: DC
Start: 2014-03-14 — End: 2014-03-25
  Administered 2014-03-14 – 2014-03-25 (×22): 25 mg via ORAL
  Filled 2014-03-14 (×22): qty 2

## 2014-03-14 MED ORDER — INSULIN ASPART 100 UNIT/ML SC SOLN
3.00 [IU] | Freq: Once | SUBCUTANEOUS | Status: DC
Start: 2014-03-14 — End: 2014-03-25

## 2014-03-14 MED ORDER — ALBUTEROL-IPRATROPIUM 2.5-0.5 (3) MG/3ML IN SOLN
3.00 mL | RESPIRATORY_TRACT | Status: DC | PRN
Start: 2014-03-14 — End: 2014-03-25

## 2014-03-14 MED ORDER — HYDRALAZINE HCL 25 MG PO TABS
75.00 mg | ORAL_TABLET | Freq: Two times a day (BID) | ORAL | Status: DC
Start: 2014-03-14 — End: 2014-03-25

## 2014-03-14 MED ORDER — GLUCAGON 1 MG IJ SOLR (WRAP)
1.00 mg | INTRAMUSCULAR | Status: DC | PRN
Start: 2014-03-14 — End: 2014-03-25

## 2014-03-14 MED ORDER — POLYETHYLENE GLYCOL 3350 17 G PO PACK
17.0000 g | PACK | Freq: Every day | ORAL | Status: DC
Start: 2014-03-15 — End: 2014-03-22
  Administered 2014-03-17: 17 g via ORAL
  Filled 2014-03-14 (×8): qty 1

## 2014-03-14 MED ORDER — AMIODARONE HCL 200 MG PO TABS
200.00 mg | ORAL_TABLET | Freq: Every day | ORAL | Status: AC
Start: 2014-03-14 — End: 2014-03-19

## 2014-03-14 MED ORDER — HEPARIN SODIUM (PORCINE) 5000 UNIT/ML IJ SOLN
5000.00 [IU] | Freq: Two times a day (BID) | INTRAMUSCULAR | Status: DC
Start: 2014-03-14 — End: 2014-03-25
  Administered 2014-03-14 – 2014-03-25 (×22): 5000 [IU] via SUBCUTANEOUS
  Filled 2014-03-14 (×22): qty 1

## 2014-03-14 MED ORDER — FLUTICASONE PROPIONATE HFA 220 MCG/ACT IN AERO
2.00 | INHALATION_SPRAY | Freq: Two times a day (BID) | RESPIRATORY_TRACT | Status: DC
Start: 2014-03-14 — End: 2014-03-25

## 2014-03-14 MED ORDER — POLYETHYLENE GLYCOL 3350 17 G PO PACK
17.00 g | PACK | Freq: Every day | ORAL | Status: DC
Start: 2014-03-14 — End: 2014-04-23

## 2014-03-14 MED ORDER — TRAMADOL HCL 50 MG PO TABS
50.00 mg | ORAL_TABLET | Freq: Four times a day (QID) | ORAL | Status: DC | PRN
Start: 2014-03-14 — End: 2014-03-25

## 2014-03-14 MED ORDER — INSULIN ASPART 100 UNIT/ML SC SOLN
3.00 [IU] | Freq: Every morning | SUBCUTANEOUS | Status: DC
Start: 2014-03-14 — End: 2014-03-25

## 2014-03-14 MED ORDER — ALBUTEROL-IPRATROPIUM 2.5-0.5 (3) MG/3ML IN SOLN
3.00 mL | RESPIRATORY_TRACT | Status: DC | PRN
Start: 2014-03-14 — End: 2014-03-25
  Filled 2014-03-14: qty 3

## 2014-03-14 MED ORDER — CADEXOMER IODINE 0.9 % EX GEL
Freq: Every day | CUTANEOUS | Status: DC
Start: 2014-03-15 — End: 2014-03-20
  Filled 2014-03-14: qty 40

## 2014-03-14 MED ORDER — INSULIN ASPART 100 UNIT/ML SC SOLN
3.00 [IU] | Freq: Every day | SUBCUTANEOUS | Status: DC
Start: 2014-03-14 — End: 2014-03-25

## 2014-03-14 MED ORDER — CADEXOMER IODINE 0.9 % EX GEL
Freq: Every day | CUTANEOUS | Status: DC
Start: 2014-03-14 — End: 2014-03-25

## 2014-03-14 MED ORDER — HYDRALAZINE HCL 25 MG PO TABS
75.0000 mg | ORAL_TABLET | Freq: Two times a day (BID) | ORAL | Status: DC
Start: 2014-03-14 — End: 2014-03-14

## 2014-03-14 MED ORDER — ASPIRIN 325 MG PO TBEC
325.0000 mg | DELAYED_RELEASE_TABLET | Freq: Every day | ORAL | Status: DC
Start: 2014-03-15 — End: 2014-03-25
  Administered 2014-03-15 – 2014-03-25 (×11): 325 mg via ORAL
  Filled 2014-03-14 (×11): qty 1

## 2014-03-14 MED ORDER — SODIUM BICARBONATE 650 MG PO TABS
650.00 mg | ORAL_TABLET | Freq: Three times a day (TID) | ORAL | Status: DC
Start: 2014-03-14 — End: 2014-03-25

## 2014-03-14 MED ORDER — MAGNESIUM HYDROXIDE 400 MG/5ML PO SUSP
10.00 mL | Freq: Four times a day (QID) | ORAL | Status: DC | PRN
Start: 2014-03-14 — End: 2014-03-25

## 2014-03-14 MED ORDER — INSULIN ASPART 100 UNIT/ML SC SOLN
1.0000 [IU] | Freq: Three times a day (TID) | SUBCUTANEOUS | Status: DC | PRN
Start: 2014-03-14 — End: 2014-03-25

## 2014-03-14 MED ORDER — BISACODYL 10 MG RE SUPP
10.00 mg | Freq: Every day | RECTAL | Status: DC | PRN
Start: 2014-03-14 — End: 2014-03-25

## 2014-03-14 MED ORDER — FUROSEMIDE 20 MG PO TABS
20.0000 mg | ORAL_TABLET | Freq: Every day | ORAL | Status: DC
Start: 2014-03-15 — End: 2014-03-19
  Administered 2014-03-15 – 2014-03-19 (×5): 20 mg via ORAL
  Filled 2014-03-14 (×5): qty 1

## 2014-03-14 MED ORDER — HYDRALAZINE HCL 25 MG PO TABS
25.00 mg | ORAL_TABLET | Freq: Once | ORAL | Status: AC
Start: 2014-03-14 — End: 2014-03-14
  Administered 2014-03-14: 25 mg via ORAL
  Filled 2014-03-14: qty 1

## 2014-03-14 MED ORDER — TRAMADOL HCL 50 MG PO TABS
50.0000 mg | ORAL_TABLET | ORAL | Status: DC | PRN
Start: 2014-03-14 — End: 2014-04-23

## 2014-03-14 MED ORDER — GABAPENTIN 300 MG PO CAPS
600.0000 mg | ORAL_CAPSULE | Freq: Three times a day (TID) | ORAL | Status: DC
Start: 2014-03-14 — End: 2014-03-15
  Administered 2014-03-14 – 2014-03-15 (×3): 600 mg via ORAL
  Filled 2014-03-14 (×3): qty 2

## 2014-03-14 MED ORDER — INSULIN ASPART 100 UNIT/ML SC SOLN
3.00 [IU] | Freq: Every day | SUBCUTANEOUS | Status: DC
Start: 2014-03-14 — End: 2014-03-14
  Administered 2014-03-14: 3 [IU] via SUBCUTANEOUS

## 2014-03-14 MED ORDER — AMLODIPINE BESYLATE 5 MG PO TABS
10.0000 mg | ORAL_TABLET | Freq: Every day | ORAL | Status: DC
Start: 2014-03-15 — End: 2014-03-25
  Administered 2014-03-15 – 2014-03-25 (×11): 10 mg via ORAL
  Filled 2014-03-14 (×11): qty 2

## 2014-03-14 MED ORDER — HYDRALAZINE HCL 25 MG PO TABS
25.00 mg | ORAL_TABLET | Freq: Two times a day (BID) | ORAL | Status: DC
Start: 2014-03-14 — End: 2014-03-14

## 2014-03-14 MED ORDER — ASPIRIN 325 MG PO TABS
325.00 mg | ORAL_TABLET | Freq: Every day | ORAL | Status: DC
Start: 2014-03-14 — End: 2014-05-15

## 2014-03-14 MED ORDER — DEXTROSE 50 % IV SOLN
25.00 mL | INTRAVENOUS | Status: DC | PRN
Start: 2014-03-14 — End: 2014-03-25

## 2014-03-14 MED ORDER — OXYCODONE-ACETAMINOPHEN 5-325 MG PO TABS
1.0000 | ORAL_TABLET | ORAL | Status: DC | PRN
Start: 2014-03-14 — End: 2017-07-03

## 2014-03-14 MED ORDER — AMIODARONE HCL 200 MG PO TABS
200.0000 mg | ORAL_TABLET | Freq: Every day | ORAL | Status: DC
Start: 2014-03-15 — End: 2014-03-25
  Administered 2014-03-15 – 2014-03-25 (×11): 200 mg via ORAL
  Filled 2014-03-14 (×11): qty 1

## 2014-03-14 MED ORDER — CARVEDILOL 25 MG PO TABS
25.00 mg | ORAL_TABLET | Freq: Two times a day (BID) | ORAL | Status: DC
Start: 2014-03-14 — End: 2014-05-15

## 2014-03-14 MED ORDER — VENLAFAXINE HCL 25 MG PO TABS
75.0000 mg | ORAL_TABLET | Freq: Two times a day (BID) | ORAL | Status: DC
Start: 2014-03-14 — End: 2014-03-25
  Administered 2014-03-14 – 2014-03-25 (×23): 75 mg via ORAL
  Filled 2014-03-14 (×23): qty 3

## 2014-03-14 MED ORDER — ATORVASTATIN CALCIUM 10 MG PO TABS
10.0000 mg | ORAL_TABLET | Freq: Every evening | ORAL | Status: DC
Start: 2014-03-14 — End: 2014-03-25
  Administered 2014-03-14 – 2014-03-24 (×11): 10 mg via ORAL
  Filled 2014-03-14 (×10): qty 1

## 2014-03-14 MED ORDER — INSULIN ASPART 100 UNIT/ML SC SOLN
1.00 [IU] | SUBCUTANEOUS | Status: DC | PRN
Start: 2014-03-14 — End: 2014-03-25
  Administered 2014-03-16 – 2014-03-21 (×5): 1 [IU] via SUBCUTANEOUS
  Administered 2014-03-22: 5 [IU] via SUBCUTANEOUS
  Administered 2014-03-23 – 2014-03-25 (×3): 1 [IU] via SUBCUTANEOUS
  Filled 2014-03-14: qty 1
  Filled 2014-03-14: qty 5
  Filled 2014-03-14 (×7): qty 1

## 2014-03-14 MED ORDER — SODIUM BICARBONATE 650 MG PO TABS
650.0000 mg | ORAL_TABLET | Freq: Three times a day (TID) | ORAL | Status: DC
Start: 2014-03-14 — End: 2014-03-17
  Administered 2014-03-14 – 2014-03-17 (×8): 650 mg via ORAL
  Filled 2014-03-14 (×8): qty 1

## 2014-03-14 MED ORDER — OXYCODONE-ACETAMINOPHEN 5-325 MG PO TABS
1.00 | ORAL_TABLET | ORAL | Status: DC | PRN
Start: 2014-03-14 — End: 2014-03-25
  Administered 2014-03-14 – 2014-03-25 (×22): 1 via ORAL
  Filled 2014-03-14 (×23): qty 1

## 2014-03-14 MED ORDER — HYDRALAZINE HCL 25 MG PO TABS
75.0000 mg | ORAL_TABLET | Freq: Two times a day (BID) | ORAL | Status: DC
Start: 2014-03-14 — End: 2014-03-17
  Administered 2014-03-14 – 2014-03-17 (×6): 75 mg via ORAL
  Filled 2014-03-14 (×6): qty 3
  Filled 2014-03-14: qty 2
  Filled 2014-03-14: qty 3

## 2014-03-14 MED ORDER — ACETAMINOPHEN 325 MG PO TABS
650.00 mg | ORAL_TABLET | Freq: Four times a day (QID) | ORAL | Status: DC | PRN
Start: 2014-03-14 — End: 2014-03-25
  Administered 2014-03-15: 650 mg via ORAL
  Filled 2014-03-14: qty 2

## 2014-03-14 MED ORDER — ACETAMINOPHEN 325 MG PO TABS
650.00 mg | ORAL_TABLET | ORAL | Status: AC | PRN
Start: 2014-03-14 — End: ?

## 2014-03-14 NOTE — Rehab Liaison Note ([facility]) (Signed)
NAMEQIANA [name]  MRN: [id number]  Account: [id number]  Session Start: 03/14/2014 12:00:00 AM  Session Stop: 03/14/2014 12:00:00 AM    Clinical Liaison  Inpatient Rehabilitation Pre Admission Screen    Medical Diagnosis:  CAD multivessel CAD with severe LM, mildly depressed LVEF  45%,Moderate Aortic Stenosis, AVA 1.2 cm2, chronic RLE wound, AKI, CKD, COPD,  DM2, HTN, Anemia of Chronic Disease  Rehab Diagnosis: Debility secondary to NSTEMI and multivessel CAD S/P CABG  Probable Impairment Group Code: 09 Cardiac  Demographics:   Age: 63Y   Gender: Female   Name, phone and relationship of contact person:  Contact Name: Kyren Vaux  Phone:   [phone]  Relationship:   Child   Guardian/Power of Attorney:   It is unknown whether the patient has a Advertising account planner.   [facility] Medical Record Number: [id number]  Past Medical History: Diabetes mellitus  Chronic kidney disease  Myocardial infarction  Hypertension  Hyperlipidemia  Peripheral arterial disease 06/28/13 right superficial femoral artery stenosis,  tibial peroneal trunk stenosis, left CVL a stenosis, treated with angiography  and angioplasty.  Coronary artery disease  Left circumflex artery presumed DES., Chronically occluded right coronary artery  stent  Anemia  Arterial leg ulcer 2014, receiving home health  Chronic obstructive pulmonary disease   Past Surgical History  Procedure                                                      Date  Cardiac angiography and angioplasty   06/2013  Colonoscopy                                  Unknown  Tubal ligation                                                  Unknown    Prior Level of Functioning:  Mobility:   Ambulation Device: Rolling walker Mod I for household distances, has rolling  walker and cane at home  Activities of Daily Living:  Eating: Independent. Grooming: Independent. Bathing  UB: Modified Independent. Bathing LB: Modified Independent. Dressing UB:  Independent. Dressing LB:  Modified Independent. Toileting: Modified Independent.  Cognition:  WNL  Communication:  WNL  Swallowing:  WNL    Social History:  Marital Status: unknown  Children: 2 daughters, lives with [name], she works nights , other daughter  lives in the area and can help with patient after D/C from rehab          Reside:  [place] in a 1 Story home with 1 STE  Employment Status:  retired  Facilities manager Activities/Hobbies:  Pre-hospital Living Environment: Patient lives with daughter [name] in a 1  story home with 1 STE, will return there after rehab  Language:  English  Hand Dominance: Right.    The following information was gathered for consideration and maintenance in the  medical record to substantiate medical necessity for an IRF level of care.    This assessment is being completed by Jeanella Craze , BSN, RN-BC . Patient is  currently at [facility], room 254-1 Debara Pickett RN, BSN  is covering CCM [phone].    The patient is being referred and recommended by their physician, Surinder  Thedore Mins, to be assessed both medically and functionally in regard to their  premorbid functional capacity to determine whether they can benefit from a  rehabilitation level of care offered by our facility.    The following is information regarding the medical complexity and clinical risk  factors that needs to be considered for the appropriate management of the  patient's care and recovery.  Acute Medical Conditions: NSTEMI (non-ST elevated myocardial infarction)  Hypertensive urgency  PAD (peripheral artery disease)  Arterial leg ulcer  Chronic obstructive pulmonary disease, unspecified COPD type  Ischemic cardiomyopathy  CAD (coronary artery disease)  Diabetes  Chronic kidney disease  AKI likely due to hemodynamics, CABG, and sue of IV contrast at OSH; serum  creatinine 1.6 mg/dL; metabolically stable  -HTN  -Anemia with CKD  -CKD III;  -3 vessels CAD; s/p cath and CABG  History of Present Illness:  [name] with h/o DMII, PAD, HTN,  CKD, CAD  s/p RCA stent and recent admission to OSH for NSTEMI required ICU admission, she  presented on 12/12 with syncope and elevated troponin, found to have multivessel  CAD,  then transferred from [facility] 12/14 for CV Surgery consult for  CABG. She was on a heparin gtt and her ACEI was placed on hold in setting of  ARF, plavix was held given evaluation for CABG. nitro gtt was started for  hypertensive urgency which was weaned off 12/15 for H/A. Patient also with  severe PAD with chronic RLE arterial ulcer, s/p angioplasty in 06/2013,  clinically improving, but moved 2 weeks ago and has not had wound care since  that time. Wound culture unremarkable from OSH on 12/13.  Prior to hosp patient  had not been taking her Spiriva or Symbicort for COPD, resumed in hospital. She  is also being treated for asymptomatic bacteriuria and iron deficiency anemia.  She is DMII and her Hgb A1C 7.1 on 12/12.  On 12/18 patient underwent CABG x 2  without any major complications.  12/20 patient had a rise in creatinine to 2.2,  she had episodes of HTN, Anemia, and metabolic acidosis, she was on an Insulin  gtt.  By 12/23 her labs started normalizing.  She was scheduled to undergo LE  angiogram on 12/24 and the physician canceled the procedure and wants it done  outpatient unless it's an emergent procedure. Rationale was to allow her renal  function to recover before subjecting her to another insult to the kidney with  IV contrast. Patient remained stable through the weekend and is ready for  admission to acute rehab today.  her insurance is approved and bed is available.     Date of Onset: 02/26/14   Date Admitted to Acute:  02/28/14  Current Precautions:   Cardiac.   Hypertension precautions.   Risk for falls.   Skin fragility.   Sternal precautions.  Food Allergies  No known food allergies.  Medication Allergies   Penicillin: Anaphylaxis.   Angioedema  Other Allergies Not applicable.    Present Systems Summary:  Maximum  Temperature (last 48 hrs):  98.9 degrees.  Pulse:  72 beats per minute.  Respirations:  18 breaths per minute.  Blood Pressure:   139/63 mmHg.  Pulse Ox: 96% on RA  Height:   5 ft 1/2 inches  Weight:   156 lbs 3 oz  Hearing: No current issues  Vision:  No current issues.  DIET: Type: Cardiac. Consistent Carb  Current Diet Texture: Regular diet.  Current Liquid Consistency: Thin liquids.  Bladder: Patient is continent of bladder.  Bowel: Patient does not have an ostomy.  Date of Last Bowel Movement:  03/12/14 Patient is continent of bowel.  Integumentary:   Surgical Site. Chest-CDI   Ulcer R foot Wound note from 12/19:  Chronic wound to RLE foot 12cmx3cmx0.2cm.  Wound bed with desiccated bone/tendon distally and nongranular proximal tissue.  Hx. Of stent to RLE. Followed as outpatient by wound clinic. Has been using  Medihoney topically. Will start Iodosorb gel to provide antimicrobrial and  facilitate debridement.  Cardiopulmonary:   Room Air.   Incentive Spirometry:  Dialysis: Patient currently is not receiving dialysis.  Current Medication(s): 03/14/14     amiodarone  200 mg  Oral  Daily  amLODIPine  10 mg  Oral  Daily  aspirin  325 mg  Oral  Daily  atorvastatin  10 mg  Oral  QHS  cadexomer iodine   Topical  Daily  carvedilol  25 mg  Oral  Q12H SCH  darbepoetin alfa (ARANESP) injection  12.5 mcg  Subcutaneous  Weekly  fluticasone  2 puff  Inhalation  BID  furosemide  20 mg  Oral  Daily  gabapentin  600 mg  Oral  Q12H SCH  heparin (porcine)  5,000 Units  Subcutaneous  Q12H SCH  hydrALAZINE  25 mg  Oral  Once  hydrALAZINE  75 mg  Oral  Q12H  insulin aspart  3 Units  Subcutaneous  QAM W/BREAKFAST  insulin aspart  3 Units  Subcutaneous  Daily with dinner  insulin aspart  3 Units  Subcutaneous  Daily with lunch  polyethylene glycol  17 g  Oral  Daily  senna-docusate  2 tablet  Oral  QHS  sodium bicarbonate  650 mg  Oral  TID  venlafaxine  75 mg  Oral  Q12H SCH  Current Alcohol Use:  Patient does not consume  alcohol  Current Tobacco Use:  Patient uses tobacco. former smoker 1 pack/day for 20  years, Quit 02/29/12  Current Drug Use: No, patient does not use recreational drugs.  Pain: Patient currently without complaints of pain.    Additional medical documentation contributing to the expected care of this  patient may be noted in the following areas:  Laboratory-Chemistry/Hematology:   03/14/2014 05:13  Glucose: 103 (H)  BUN: 28.0 (H)  Creatinine: 1.6 (H)  Sodium: 141  Potassium: 4.9  Chloride: 109  CO2: 26  Calcium: 8.4 (L)  EGFR: 39.2  03/09/2014 06:08  WBC: 8.32  Hemoglobin: 8.8 (L)  Hematocrit: 30.1 (L)  Platelet Count: 181  RBC: 3.40 (L)  MCV: 88.5  MCH, POC: 25.9 (L)  MCHC: 29.2 (L)  RDW: 21 (H)  MPV: 10.1  Cultures:   Urine: 03/12/2014 03:53  Urine Type: Clean Catch  Color, UA: Yellow  Clarity, UA: Clear  Specific Gravity, UA: 1.010  Urine pH: 7.0  Leukocyte Esterase, UA: Large (A)  Nitrite, UA: Negative  Protein, UR: 100 (A)  Glucose, UA: Negative  Ketones UA: Negative  Urobilinogen, UA: Normal  Bilirubin, UA: Negative  Blood, UA: Negative  RBC, UA: 0 - 5  WBC, UA: 26 - 50 (A)  Squamous Epithelial Cells, Urine: 6 - 10  Trans Epithel, UA: 0 - 2   Blood: SOURCE: Blood, Venipuncture peripheral COLLECTED: 02/28/14 23:44  ANTIBIOTICS AT COLL.: RECEIVED : 03/01/14 01:49  Culture Blood Aerobic and Anaerobic FINAL 03/06/14  04:21  03/06/14 No growth after 5 days of incubation  Radiology:   Chest X-Ray: History: Status post CABG.  Comment:  Frontal and lateral views the chest were obtained and compared to  03/06/2014.  Lines and tubes removed. Tiny left apical pneumothorax. Basal  atelectasis decreased with trace pleural effusions. Cardiac mediastinal  silhouette stable. Median sternotomy wires again noted.  IMPRESSION:  Impression:  Tiny left apical pneumothorax. Left message for nurse at 5:00 PM.  Clide Cliff, MD  03/10/2014 5:08 PM  IVs:  IV Access: PIV L antecubital  Date of Insertion: 03/13/14    Is patient  presently participating in rehabilitation? Yes  Adjustment to Present Illness: Patient is coping adequately.   Patient is accepting limitations adequately.   Patient's expectations are realistic.   Patient is motivated.  Activity Tolerance:  Fair.  SPECIAL NEEDS: None.    Current Functional Status  Weight-bearing Status:   Sternal precautions      Location:  Sternum  Mobility: 03/14/14  Functional Mobility  Rolling: cga/min w/ occasional cues for sternal precautions  Supine to Sit:min/mod w/ cues for sternal precautions  Scooting: min/cga  Sit to Stand: min/cga depending on surface height  Stand to Sit: min  Transfers: mod/min  Ambulation  Level of Assistance required: mod/min; variable on fatigue/pain level  Ambulation Distance: 3 ft x 2; 5 ft x 1  Pattern: decrease wt bearing R foot, decrease balance, decrease R foot  clearance,decrease step length  Device Used: therapist  Weightbearing Status: wbat  Activities of Daily Living: 03/14/14  Eating: Setup  Grooming: Supervision  UE Dressing: independent to don gown overhead  LE Dressing: independent to don/doff socks while seated using cross leg  technique, min A to don pants secondary to decreased balance in standing  Toileting: BSC  Cognition: 03/14/14  AandO x 4 , no cognitive deficits  Communication: 03/14/14  Patient able to communicate effectively, follow commands  Swallowing:  03/14/14 WNL  Balance: 03/14/14  Balance  Static Sitting: supervision  Dynamic Sitting: supervision  Static Standing: CGA  Dynamic Standing: min A    Other Impairments: Gait abnormality Impaired sensation Lack of coordination    Patient is able to understand and make healthcare decisions  Yes  Payer Source: Level of care will be discussed with the following payer sources  if/when applicable.  Primary: FEP BCBS  Secondary: Not Applicable    Information and Case Discussion:   Rehabilitation risks/benefits were reviewed.  Patient/family/caregiver agrees to/accepts rehabilitation  risks/benefits.   Rehab literature/brochure was provided to: 03/08/14 to patient .  Case will be  discussed with Physician/Medical Director.        IRF Admission Approval/Non-Approval  Appropriateness for admission to the Inpatient Rehabilitation Facility:  The  patient's condition is sufficiently stable to allow active participation in an  intensive interdisciplinary inpatient rehabilitation program. The patient would  benefit from interdisciplinary inpatient rehabilitation provided by a physician,  rehab-focused nursing, and a minimum of two rehab therapies which will provide  specialized care for the following functional deficits:   Cardiac Function  Comorbid conditions present at pre-admission:  The interdisciplinary team will also manage the potential risks and  complications from the following comorbid conditions:    Cardiac, Diabetes, DVT,  COPD, Wound care, infection, Anemia, HTN, PAD, CKD  Recommended services:  The recommended interdisciplinary team will be comprised of the following  services:   Medical Supervision.  24 Hour Rehabilitation Nursing.  Physical Therapy.  Occupational Therapy.  Psychology.  Therapeutic Recreation.  Registered Dietitian.  Respiratory Therapy.  Patient's expected intensity and frequency of participation in the  interdisciplinary rehabilitation program: is 3 hours of therapy 5 days/week.  Prognosis and level of expected improvement with inpatient rehabilitation stay  is:   Mod I- supervision for mobility with least restrictive assistive device, Mod I-  supervision for ADL's and IADLs in least restrictive environment  Estimated date of admission to acute inpatient:   03/14/14  Estimated length of inpatient rehabilitation stay in order to achieve rehab  medical/functional goals:   7-10 days  Anticipated destination post discharge from inpatient rehabilitation is:   community discharge with assistance.    Anticipate patient will need the following services post discharge  from  inpatient rehabilitation: Home health therapy. Home health nursing.      Physician Approval Status of Admission: Admission Approval:  The patient's  condition is sufficiently stable to allow active participation in an intensive  interdisciplinary inpatient rehabilitation program. 03/14/14@1620hrs   63 yr old  lady with acute NSTEMI nad CABG on 12/28.  Co-morbidities HTN, HLD, DM2, PVD,  and COPD.  She needs rehab at IRF intensity with close medical management.   RVG      This assessment denotes that on admission to the inpatient rehabilitation  facility, our physician will provide documentation that demonstrates clinical  rehabilitation complications for which the patient is at risk and a specific  plan to avoid those risks. Further, the medical conditions present create  possible adverse conditions that predictably can be controlled through an  intensive rehabilitation plan of care to be outlined at admission.    Annotated by: Lorrin Mais  Annotated: 03/14/2014 6:27:00 PM  Approved for acute rehab admission by Emilia Beck at [facility] [phone]  Approved for 8 days (12/28-03/20/14)  Auth# to follow  Update on 03/21/14 to Verita Lamb at [phone]  Fax [phone]    Signed by: Jeanella Craze, BSN, RN-BC 03/14/2014 4:05:00 PM    Physician CoSigned By: Jordan Hawks 03/14/2014 16:21:33

## 2014-03-14 NOTE — PT Progress Note (Signed)
[facility]   Physical Therapy Treatment  Patient:  [name] MRN#:  [id number]  [facility]  Bed: FI254/FI254-01    Discharge Recommendations:   D/C Recommendations: Acute Rehab   DME Recommendations: tbd    If Acute Rehab recommended discharge disposition is not available, patient will need mod assist for mobility, w/c for distances, bsc, hospital bed equipment, and continued skilled PT.        Assessment:   Pt is progressing w/ each session.Pt very  motivated to re-gain independence w/ mobility. Required decrease assist w/ transfer/gt. Improving gt distances. Occasional cues for sternal precautions . Pt at risk for falls. Will benefit from continued PT to increase strength, improve balance/endurance for max functional independence w/ mobility.  Pt is a great candidate for acute rehab at this time.     Treatment Activities: gt,transfers, ex, standing balance    Educated the patient to role of physical therapy, plan of care, goals of therapy and safety with mobility and ADLs, energy conservation techniques, sternal precautions.    Plan:   PT Frequency: 3-4x/wk    Continue plan of care.       Precautions and Contraindications:   Falls, sternal    Updated Medical Status/Imaging/Labs:   Impression:CXR 03-10-14  Tiny left apical pneumothorax  Subjective:    I need to walk more  Patient's medical condition is appropriate for Physical Therapy intervention at this time.  Patient is agreeable to participation in the therapy session. Nursing clears patient for therapy.    Pain:   Scale: controlled  Location: R foot /sx site  Intervention: position of comfort/pain meds    Objective:   Patient is in bedside chair with telemetry, iv , R foot w/ ace wrap/dressing in place.    Cognition  Awake, follows commands    Functional Mobility  Rolling: cga/min w/ occasional cues for sternal precautions  Supine to Sit:min/mod w/ cues for sternal precatuions  Scooting: min/cga  Sit to Stand: min/cga  depending on surface height  Stand to Sit: min  Transfers: mod/min    Ambulation  Level of Assistance required: mod/min; variable on fatigue/pain level  Ambulation Distance: 3 ft x 2; 5 ft x 1  Pattern: decrease wt bearing R foot, decrease balance, decrease R foot clearance,decrease step length  Device Used: therapist  Weightbearing Status: wbat    Balance:  Static Sitting: supervision  Dynamic Sitting: cga  Static Standing: min  Dynamic Standing: mod    Therapeutic Exercises  Ap, qs, hip flex, hip abd,saq, slr's in rep 10-15    Patient Participation: good  Patient Endurance: fair+    Patient left with call bell within reach, all needs met, SCDs;pt decline fall mat in place, chair alarm on and all questions answered. RN notified of session outcome and patient response.     Goals:  Goals  Time for Goal Acheivement: 7 visits  Pt Will Go Supine To Sit: with contact guard assist  Pt Will Perform Sit To Supine: with contact guard assist  Pt Will Transfer Bed/Chair: with minimal assist  Pt Will Ambulate:  (w/ mod x 2 100 ft)      Georgeanne Nim PT# [id number]    Time of Treatment:  PT Received On: 03/14/14  Start Time: 1020  Stop Time: 1100  Time Calculation (min): 40 min  Treatment # 3 out of 7 visits    }

## 2014-03-14 NOTE — Progress Notes (Signed)
Approved for acute rehab admission by Emilia Beck at [facility]. Approved for 8 days. Notified Ollen Bowl of Berkley Harvey approval.     Lorrin Mais, RN  Rehab Insurance Authorization Nurse / [facility] [place]  [phone]

## 2014-03-14 NOTE — Progress Notes (Signed)
[facility] [employer].  Progress Note    Assessment:    -AKI likely due to hemodynamics, CABG, and sue of IV contrast at OSH; serum creatinine 1.6 mg/dL; metabolically stable   -HTN with CKD; better controlled   -Anemia with CKD  -CKD III;  -3 vessels CAD; s/p cath and CABG     Plan:    -supportive care   -labs   -avoid nephrotoxins   -ESA  -please delay LE angiogram unless it's an emergent procedure. We should allow her renal function to recover before subjecting her to another insult to the kidney with IV contrast.       Burnis Medin, MD  Office - [phone]  [employer] - [phone]  ++++++++++++++++++++++++++++++++++++++++++++++++++++++++++++++  Subjective:  No new complaints    Medications:  Scheduled Meds:  Current Facility-Administered Medications   Medication Dose Route Frequency   . amiodarone  200 mg Oral Daily   . amLODIPine  10 mg Oral Daily   . aspirin  325 mg Oral Daily   . atorvastatin  10 mg Oral QHS   . cadexomer iodine   Topical Daily   . carvedilol  25 mg Oral Q12H SCH   . darbepoetin alfa (ARANESP) injection  12.5 mcg Subcutaneous Weekly   . fluticasone  2 puff Inhalation BID   . furosemide  20 mg Oral Daily   . gabapentin  600 mg Oral Q12H SCH   . heparin (porcine)  5,000 Units Subcutaneous Q12H [facility]   . hydrALAZINE  50 mg Oral Q12H   . insulin aspart  3 Units Subcutaneous QAM W/BREAKFAST   . insulin aspart  3 Units Subcutaneous Daily with dinner   . insulin aspart  3 Units Subcutaneous Daily with lunch   . polyethylene glycol  17 g Oral Daily   . senna-docusate  2 tablet Oral QHS   . sodium bicarbonate  650 mg Oral TID   . venlafaxine  75 mg Oral Q12H SCH     Continuous Infusions:   PRN Meds:acetaminophen, albuterol-ipratropium, bisacodyl, dextrose, glucagon (rDNA), insulin aspart, ondansetron, oxyCODONE-acetaminophen, traMADol    Objective:  Vital signs in last 24 hours:  Temp:  [97.4 F (36.3 C)-98.9 F (37.2 C)] 97.9 F (36.6 C)  Heart Rate:  [65-79] 79  Resp  Rate:  [16-18] 18  BP: (122-157)/(59-74) 157/70 mmHg  Intake/Output last 24 hours:    Intake/Output Summary (Last 24 hours) at 03/14/14 0928  Last data filed at 03/14/14 0600   Gross per 24 hour   Intake   1390 ml   Output   2450 ml   Net  -1060 ml     Intake/Output this shift:       Physical Exam:   Gen: WD WN NAD   CV: S1 S2 N RRR   Chest: CTAB   Ab: ND NT soft no HSM +BS   Ext: No C/E    Labs:    Recent Labs  Lab 03/14/14  0513 03/13/14  0353 03/12/14  0341  03/09/14  0608 03/08/14  0358   GLUCOSE 103* 108* 104*  < > 62* 92   BUN 28.0* 28.0* 28.0*  < > 32.0* 39.0*   CREATININE 1.6* 1.7* 1.5*  < > 1.6* 1.9*   CALCIUM 8.4* 8.0* 8.2*  < > 8.1* 8.1*   SODIUM 141 141 140  < > 142 139   POTASSIUM 4.9 5.1 5.0  < > 4.4 4.7   CHLORIDE 109 110 110  < >  112* 111   CO2 26 27 25   < > 22 20*   PHOSPHORUS  --   --   --   --  2.4 2.6   MAGNESIUM  --   --   --   --  2.3 2.3   < > = values in this interval not displayed.    Recent Labs  Lab 03/09/14  0608 03/08/14  0358   WBC 8.32 7.35   HEMOGLOBIN 8.8* 8.5*   HEMATOCRIT 30.1* 28.9*   MCV 88.5 88.7   MCH, POC 25.9* 26.1*   MCHC 29.2* 29.4*   RDW 21* 20*   MPV 10.1 10.3   PLATELETS 181 170

## 2014-03-14 NOTE — Progress Notes (Addendum)
Writer called and spoke to [employer] with [employer]. [facility]. Per Cindi Carbon, IMV is working on English as a second language teacher. Will require updated PT/OT notes. Insurance authorization most likely will not be obtained until tomorrow 12/29.     Writer paged Chales Abrahams, PT and [place], [place].     @[phone]   Followed up with patient. Patient agreeable to acute rehab. Requested that writer speak with daughter, Shiron Whetsel [phone].     Writer called and left message with Yolanda.     Debara Pickett, RN, BSN  Clinical Case Manager I  [facility]  [phone]

## 2014-03-14 NOTE — Discharge Summary (Signed)
FINAL                    [facility]                             Discharge  Summary                                  AMENDED     Name:          [name], [name]  Facility:      [facility]  MRN:           [id number]               Admission  Date:  02/28/2014  Birth Date:    1950-06-08              Discharge  Date:  03/14/2014  Surgery Date:  03/04/2014     Attending Surgeon:   Hue Frick,M.D.  Ref Cardiologist:    Rajan,Narian,M.D.     Procedure(s):   2      LIMA  to the LAD      SVG to  the OM 1     Previous Cardiovascular  Intervention        History of Present  Illness:    Cardiac Surgery  was consulted on  [name] who is  a 64 year-old    female with Multivessel  Coronary Artery  Disease and  recent NSTEMI.    She presented to  [facility]  02/26/14 with report  of syncope    while sitting at  kitchen table (witnessed  by her daughter).  Cardiac    Enzymes were elevated  and she ruled  in for a NSTEMI.   Cardiac Cath    was revealed with  Multivessel CAD.   She has a history  of    angioplasty to her  right leg.  She  was noted to have  asymptomatic    bacteuria at [facility].   She has a  Chronic Right Lower  Extremity    Ulcer and is followed  in an outpatient  Wound Clinic.   Her past    medical history  is also significant  for Hypertension,  Insulin    Dependent Diabetes,  COPD, CKD (stage  3), Anemia, PAD  and Remote    Tobacco Abuse.       STS Risks    3.006% Risk of Mortality    25.732% Risk of  Morbidity or Mortality                    Surgery Indications  CAD  Acute MI           Discharge Diagnosis     CAD  Hyperlipidemia  Hypertension  COPD  Renal Failure  Diabetes           Past Medical History:    Diabetes    Hypertension           CABG Procedure   VESSEL                     CONDUIT  Proximal  LAD             In Situ LIMA  Circumflex  Vein graft        Postoperative Course:  The patient  tolerated    the procedure   and was  transferred   from the  operating  room  to the  ICU  in stable  condition   on Epinephrine  and  Primacor  drips.  Inotropic  and  vasopressive  support   was weaned   as  cardiac  index   and  BP  normalized.   The  patient   was  weaned  from  ventilator support  and extubated  without  difficulty. The  patient was  transferred  on  postoperative  day  #2 to  cardiovascular  stepdown   in  stable condition.     Wound Care  and  Vascular  Surgery  were consulted  for  her  right foot  ulcer.   Right iliac  stenting  was recommended  but  is being  deferred  until her  renal  function  has  stabilized  after  two recent  insults.  While on  SDU the  patient  remained hemodynamically   stable.   She had  acute  kidney  injury  on  chronic   kidney  disease.    Nephrology  was  consulted.   Antihypertensive   medications  were  titrated  up.   Chest  tubes and  pacing  wires  were  discontinued  without  incident.  At the  time  of  discharge   the   patient  was   tolerating  a   PO  diet  and  ambulating.  On POD  10 the  patient is  stable  for discharge  with  an HCT  of 30.1  and a Creatinine  level of 1.6.              The patient is being  discharged home  on the following  medications:     MEDICATION           DOSE      UNITS        FREQUENCY  COMMENTS                                              every 4  acetominophen        650       mg                      fever,  pain                                              hours prn                                              every 2  Duo-Neb              3         ml  wheezing                                              hours prn                                                         for 5  days, then  Amiodarone           200       mg           Daily                                                         discontinue  Amlodipine           10        mg           Daily  Aspirin              325       mg            Daily  cadexomer iodine                                              Daily  0.9% gel  Flovent 220 mcg/act  2         puffs        BID  Novolog              3         units        TID AC  Novolog              1-4       units        TID AC     sliding  scale                                              every 4  Percocet 5-325       1         tab                     pain                                              hours prn  Miralax              17        g            Daily  Sodium bicarbonate   650       mg  TID                                              every 4  Tramadol             50        mg                      pain                                              hours prn                                              every 12  Coreg                25        mg                                              hours                                              every 12  hydralazine          75        mg                                              hours  atorvastatin         10        mg           nightly  Lasix                20        mg           Daily  gabapentin           600`      mg           TID  venlafaxine          75        mg           BID     Contraindicated  or Not Indicated  Medications:         due to Renal Dysfunction              PATIENT INSTRUCTIONS  Activity:  Light Independent  & Advance as Tolerated  Return to Work: 6  weeks  No Driving for 6  weeks  Sternal precautions        Diet:  Cardiac and consistent  carbohydrate        Special Instructions:  Follow-up in 1 weeks  with cardiac  surgery.  Follow-up with cardiologist,  nephrologist,  PCP, and  pulmonologist as  directed.        Patient education  was begun prior  to discharge in accordance  with  standards set by  the CVT service and  continued as necessary  through  discharge. This education  included  input from dietary  and cardiac  rehabilitation consultants.  A list  of instructions  was given to the  patient at the time  of  discharge which  emphasized permissible  activities, diet,  discharge medications  and emergency  contact  methods. A copy of  these instructions  is detailed on  the patient1s  hospital record.             electronically  signed on 03/14/2014  4:25:43 PM  with status of  Final

## 2014-03-14 NOTE — Plan of Care (Addendum)
Problem: Moderate/High Fall Risk Score >/=15  Goal: Patient will remain free of falls  Outcome: Progressing    Patient transferred to [facility]. Sent with belongings, patient specific medications, prescriptions, and paperwork about her stay. Wheelchair transport used to transport to rehab facility. Will call report to nurse at [facility]. [place].

## 2014-03-14 NOTE — Discharge Instructions (Signed)
DIABETES : Please monitor glucose in rehab -- if glucose >150 for 2 days, recommend starting lantus 5 units qAM. If persistently >120 after that, would increase by 1 unit. Recommend continue novolog 3/3/3 with low dose novolog SSI qac PRN. Discharge with lantus (if patient requires during course in rehab) and humalog (patient has pens for lantus and humalog at home). Please make sure the patient has an appointment with IMG physician upon discharge from rehab for diabetes within 1 week for insulin dose adjustments.         ________________________________________________________________________________________________    Please call the Vascular office to schedule an angiogram once your kidney function improves.       [facility]  [address]  [place], [place] [address]  Ph: [phone] Fax: [phone]    Directions from [facility]:  1. Head North (left) onto [address]  2. Go straight through the [address] interchange  3. After the shopping center on right, make next right onto [address], follow approximately 0.2 miles, then take left onto [address]. The CVTSA office is 0.2 miles on the right.    Discharge Instructions    Return Appointments:   Surgeon's office visit [phone] (CABG in one week, valves in one week with chest X-ray and copy of chest xray on disk). Call to make appointment when you get home.   Cardiologist: Call office for an appointment when you get home in two weeks.   Primary Care Physician (PCP): Call office for an appointment when you get home in three weeks.    Medications:   Use medication schedule to determine when to take medications.   Use the medication information sheets for details on your medications including side effects.   Take only medications prescribed on Patient Discharge form.   Bring a copy of the discharge order/new medication list with you to all physician/surgeon appointments;  medications may be changed over time.    Diet:   Cardiac and consistent carbohydrate    Exercise:   Get up and get dressed each day.   Climb stairs 1-2 times a day.   Rest twice daily for 20 minutes.   Walk four times per day, 7-8 minutes each. Gradually build up to 30 minutes once a day.   Resume sexual activity when ready (when able to climb stairs without shortness of breath).   Do not drive until directed by surgeon (generally after follow-up appointment).    Incision Care:   Shower each day. Wash each incision with a fresh, clean, washcloth using mild soap and warm water (example: Dove Sensitive Skin body wash). Avoid vigorous scrubbing. Check incisions every day for signs of infection such as warm to touch, redness, purulent drainage or fever of 101 or greater.   No tub baths until wounds are completely healed.   Leave incisions open to air. DO NOT apply any oils, creams, lotions, or powder to incisions, unless prescribed by your physician.    Self Care:   Keep legs elevated above the level of your heart when resting.   Use incentive spirometer every 1-2 hours while awake.   Take temperature daily. Call if greater than 101.   Take and record weight daily in the morning. Call if gain greater than 3 lbs in one day or greater than 5 lbs in 1 week.   Sternal precautions: DO NOT lift more than 5-10 lbs and avoid any upper body exercise for 8-12 weeks.  Things to Expect:   Loss of appetite is normal. Eat what you feel like until your appetite returns avoiding added salt. When able, resume prescribed low fat, low cholesterol, low salt diet.   Chest tube sites may ooze.   Difficulty sleeping, odd dreams, and getting comfortable in bed are common complaints. Taking pain medications before bed may help.   Bruised and swollen leg incision. Hand swelling if radial artery used.   Chest or incision pain/discomfort as well as back and shoulder pain.   Feelings of depression or the "blues" are  common.    6 Daily Do's for ALL surgical patients:  1. Shower daily with above guidelines  2. Daily morning weight before breakfast  3. Incentive Spirometry per instructions  4. Pulse/Blood Pressure check  5. Temperature check daily and as needed   6. Ambulation/Exercise per Home Recovery Plan    Additional Instructions:   none    Call Cardiac Surgery Office [phone] if you experience any of the following:   Pulse less than 50 or greater than 120 beats per minute.   Blood pressure higher than 150 or less than 90 (the top number)   Irregular or fluttering heart beat.   Temperature greater than 101 degrees by mouth, chills, and or uncontrolled shaking.   Chest pain (angina) other than incision pain.   Incision site that is very painful, swollen, warm to the touch, red, or persistently draining.   Shortness of breath at rest.   Weight gain of 3 pounds in 1 day or 5 pounds in 1 week.    Please refer to the HOME RECOVERY PLAN for weekly guidelines.    If you need a follow-up chest xray, please go to the following:     [facility]  Patient Scheduling [phone]  Schedule online at [email or web address]  You must bring this prescription with you to your exam    (1) [facility]  [address]   [place], [place] [address]  Phone [phone]  Fax [phone] (5) [facility]  [address] [place], [place] [address]  Phone [phone]  Fax [phone] [facility]  [address]  [place] [address]  Phone [phone]  Fax [phone] [facility]  [address]  [place], [place] [address]  Phone [phone]  Fax [phone] [facility]  [address]  [place], [place] [address]  Phone [phone]  Fax [phone]   (2) [facility]; [address]  [place], [place] [address]  Phone [phone]  Fax [phone] (6) [facility] PET/CT Imaging [facility]  [address]  [place], [place] [address]  Phone [phone]  Fax [phone]) [facility]  [address]  [place], [place] [address]  Phone [phone]  Fax [phone] [facility]  [address]  [place], [place] [address]  Phone [phone]  Fax [phone] [facility]) Film Library  [address]  [place], [place] [address]  Phone [phone]  Fax [phone]     (3) [facility]  [address] (CT, Ultrasound)  [address] ([place], [place] [address]  Phone [phone]  Fax [phone]  [phone] (7) [facility]  [address]  [place], [place] [address]  Phone [phone]  Fax [phone]) [facility]  [address]  Ultrasound [address]  [place], [place] [address]  Phone [phone]  Fax [phone]  Fax [phone] (ultrasound) (15) [facility]  [address] [place]  [place], [place] [address]  Phone [phone]  Fax [phone] [facility]) Business Office  [address]  [place], [place] [address]  Phone [phone]  Fax [phone]   (4) [facility]. [address]   [place], [place] [address]  Phone [phone]  Fax [phone] (8) [facility] at [facility]  [address]  [place], [place] [address]  Phone [phone]  Fax [phone] [facility]  [address]  [place], [place] [address]  Phone [phone]  Fax [phone] [facility]  [address]  [place], [place] [address]  Phone [phone]  Fax [phone]      Visit [email or web address] for detailed directions         AMI and CHF Heart Failure Core Measure Medication  Checklist at Discharge:    Aspirin:    Prescribed  Statin:    Prescribed  Nitroglycerin:      Not Prescribed -  Not Applicable  Platelet Inhibitor:   Not Prescribed - Not Applicable  ACE-I:   Not Prescribed - Worsening Kidney Function  ARB:     Not Prescribed - Worsening Kidney Function  B-Blocker:    Prescribed

## 2014-03-14 NOTE — Progress Notes (Signed)
CARDIAC SURGERY CVSD/CTU PROGRESS NOTE    POD: 10    SURGERY: CABGx2    EF: 45%    SURGEON: Austin Miles, MD    ASSESSMENT AND PLAN:  Active Problems:    NSTEMI (non-ST elevated myocardial infarction)    Hypertensive urgency    PAD (peripheral artery disease)    Arterial leg ulcer    Chronic obstructive pulmonary disease, unspecified COPD type    Ischemic cardiomyopathy    CAD (coronary artery disease)    Diabetes    Chronic kidney disease    Increased hydralazine for hypertension.  Will d/c to rehab once prior authorization complete and bed available at [facility].  Placement of right iliac artery stent can be done as an outpatient.    SUBJECTIVE:     Feels good.  No new complaints.  No overnight events.    OBJECTIVE:  VITALS LAST 24 HOURS:  Temp:  [97.4 F (36.3 C)-98.9 F (37.2 C)] 97.9 F (36.6 C)  Heart Rate:  [65-79] 79  Resp Rate:  [16-18] 18  BP: (122-157)/(59-70) 157/70 mmHg  O2 saturation: 93-97% on RA    PHYSICAL EXAM:  General: in chair, good affect  Lungs: ctab  Heart: RRR, I-II/VI SEM heard best at the right sternoclavicular border, s1/s2 present, no m/r/g  Abd: nd, +bs, soft, nt  Neuro: no focal deficits  Extremities: no edema  Incisions: c/d/i  T/L/D: PIV    RECENT LABS:  Results     Procedure Component Value Units Date/Time    Basic Metabolic Panel [[id number]]  (Abnormal) Collected:  03/14/14 0513    Specimen Information:  Blood Updated:  03/14/14 0602     Glucose 103 (H) mg/dL      BUN 40.9 (H) mg/dL      Creatinine 1.6 (H) mg/dL      CALCIUM 8.4 (L) mg/dL      Sodium 811 mEq/L      Potassium 4.9 mEq/L      Chloride 109 mEq/L      CO2 26 mEq/L     GFR [[id number]] Collected:  03/14/14 0513     EGFR 39.2 Updated:  03/14/14 0602    Glucose Whole Blood - POCT [[id number]] Collected:  03/14/14 0527     POCT - Glucose Whole blood 92 mg/dL Updated:  46/96/29 5284    Glucose Whole Blood - POCT [[id number]]  (Abnormal) Collected:  03/13/14 2144     POCT - Glucose Whole blood 101 (H) mg/dL Updated:   72/53/66 4403    Glucose Whole Blood - POCT [[id number]]  (Abnormal) Collected:  03/13/14 1619     POCT - Glucose Whole blood 150 (H) mg/dL Updated:  87/56/43 3295    Glucose Whole Blood - POCT [[id number]]  (Abnormal) Collected:  03/13/14 1101     POCT - Glucose Whole blood 176 (H) mg/dL Updated:  30/16/01 0932        MEDICATIONS:  Current Facility-Administered Medications   Medication Dose Route Frequency   . amiodarone  200 mg Oral Daily   . amLODIPine  10 mg Oral Daily   . aspirin  325 mg Oral Daily   . atorvastatin  10 mg Oral QHS   . cadexomer iodine   Topical Daily   . carvedilol  25 mg Oral Q12H SCH   . darbepoetin alfa (ARANESP) injection  12.5 mcg Subcutaneous Weekly   . fluticasone  2 puff Inhalation BID   . furosemide  20 mg Oral Daily   . gabapentin  600 mg Oral Q12H SCH   . heparin (porcine)  5,000 Units Subcutaneous Q12H [facility]   . hydrALAZINE  25 mg Oral Once   . hydrALAZINE  75 mg Oral Q12H   . insulin aspart  3 Units Subcutaneous QAM W/BREAKFAST   . insulin aspart  3 Units Subcutaneous Daily with dinner   . insulin aspart  3 Units Subcutaneous Daily with lunch   . polyethylene glycol  17 g Oral Daily   . senna-docusate  2 tablet Oral QHS   . sodium bicarbonate  650 mg Oral TID   . venlafaxine  75 mg Oral Q12H SCH     INTAKE/OUTPUT:  Intake and Output Summary (Last 24 hours) at Date Time    Intake/Output Summary (Last 24 hours) at 03/14/14 1010  Last data filed at 03/14/14 0600   Gross per 24 hour   Intake   1390 ml   Output   2450 ml   Net  -1060 ml                    Jacelyn Pi, PA-C

## 2014-03-14 NOTE — Progress Notes (Signed)
Case Management Facility [place] Checklist    Name of Receiving Facility and Bed #:  Receiving Facility/Home Health/Hospice/Agency: [facility]     Number for Floor RN to Call Report: Contact Information Provided: Yes (Room 524-2)  Nursing Report [phone]   Name and number of discharge nurse and time nurse notified: Deidra, RN   Family Member notified of transfer plan: Patient/Family/POA notified of transfer plan: Patient informed only     Fax number of Receiving Facility:    Hard Copy of any narcotic prescriptions in envelope to travel with the patient: Hard copy of narcotic RX sent with patient?: N/A     Hard Copy of Durable DNR and/or advanced directive in envelope to travel with the patient: Hard copy of DNR/Advance Directive sent with patient?: N/A     Phone number and name of attending physician at receiving facility (MD to MD handoff):            Transportation  Mode and Time of Transportation: Mode of Transportation: Wheelchair [place] (PTS @ 1730)         SNF Only  I have completed the Medicaid Pre- Screening (UAI, DMAS 96 & 97) and faxed it via ECIN/Allscripts to the receiving facility.    n/a   I have completed the DMAS 95 - MI/MR form and faxed it via ECIN/Allscripts to the receiving facility.       Does the DMAS 95 - MI/MR trigger a Level 2 screening?         Medicare Only  I have validated that this patient has a 3 midnight inpatient qualifying stay (SNF only).  n/a     I have validated that the patient has received the second Important Message from Medicare (IMM) letter.         The following was routed via Epic to the Receiving Facility:  Document Routed via Epic on Day of Discharge?   History & Physical y   Discharge Medication Reconciliation (.DCMEDLIST in note) y   D/C order y   Last day of PT/OT notes (if applicable) y   D/C Summary y   PICC Line Report (if applicable) n/a   Most Recent Chest Xray y   All MD Consults y   Isolation Requirement (go to isolation order in other orders  section of chart review) n/a   Urine/Wound/Blood Culture Results  y   Discharge Instructions from AVS (use Pasteboard option to copy & paste from AVS into a progress note) y   Tube Feeding or TPN Order (rate & formula) if applicable n/a   Last 3 days of MD Progress Notes y

## 2014-03-14 NOTE — Progress Notes (Signed)
Insurance authorization for acute rehab admission initiated with [employer] as requested by Ollen Bowl [facility] liaison. Contacted insurance and faxed clinicals. Anticipate pt is ready for discharge.       Lorrin Mais, RN  Rehab Insurance Authorization Nurse / [facility]  [phone]

## 2014-03-14 NOTE — Progress Notes (Addendum)
Writer spoke with Trudy with [facility]. Insurance authorization confirmed. Patient can go to:     [facility]  Room 524-2  Nursing Report [phone] ([address])    PTS WC Zenaida Niece reservation made for 1730 (patient needs to be in [employer] at [place]).     Writer updated Deidre, Charity fundraiser.     Updated patient at bedside.     Debara Pickett, RN, BSN  Clinical Case Manager I  [facility]  [phone]

## 2014-03-14 NOTE — Progress Notes (Signed)
Provided patient with list of IMG PCP's.     Debara Pickett, RN, BSN  Clinical Case Manager I  [facility]  [phone]

## 2014-03-14 NOTE — Plan of Care (Signed)
Assumed care of patient at 1900. Patient is alert and orient x4. Vitals stable. SR on telemetry. O2 sats 93% on RA. Using bed side commode with adequate urine output. OOB with 1/2 person assist. Unsteady on feet. Mobility limitation mostly due to pain caused by right foot ulcer. Ulcer covered with gauze and ace wrap. Ordered for daily dressing changes. Ointment in patient's bathroom. Midsternal and left leg incision c/d/i and OTA. Incisional pain controlled with PRN tramadol and percocet. Safety and fall precautions maintained. Will continue to monitor and notify PA of changes in status.

## 2014-03-14 NOTE — H&P (Addendum)
IRF Post-Admission Assessment  History and Physical    Reason for admission:  Ambulation and activities of daily living dysfunction due to cardiac debility status post coronary artery bypass graft x2 and status post MI with a history of severe peripheral basilar disease with right foot ulcer.    Date of admission: 03/14/2014    History of present illness:   This 63 y.o. year old right-handed female With a past medical history significant for a right leg angioplasty with a chronic right foot ulcer, hypertension, insulin-dependent diabetes, COPD, stage III chronic kidney disease, anemia, peripheral arterial disease, remote tobacco abuse who was admitted initially to [facility] with syncope 02/26/2014.  She ruled in for a non-ST elevated MI.  Cardiac catheter revealed multivessel CAD.  She was admitted to [facility] 02/28/2014.  She underwent a coronary artery bypass graft proximal LAD and circumflex.    Hospital course was notable for: Nephrology consultation for her chronic kidney disease.  Right iliac stenting was recommended but and is being deferred due to her current renal function (status post 2 recent insults).  Antihypertensives were titrated up.    The patient was noted to have anemia.    Patient was deemed Medically stable to be discharged to acute inpatient rehabilitation.    Functional history and social history:    Prior to admission, the patient was modified Independent using a rolling walker household distances.  Modified independent with her ADLs.  The patient lives with her daughter who works at night.     The patient lives in a one-story home with one step to enter.    Current functional status At the time of admission to acute inpatient rehabilitation:  Contact guard minimal assistance for bed rolling and sit to stand.  Transfers moderate to minimal assistance.  Ambulating moderate to minimal assistance depending on the pain level and fatigue 3 feet.  Upper body dressing  independent for the gown.  Lower body dressing and dependent for socks.  Minimal assistance for pants.  No problems with swallowing.     Past Medical History:   Past Medical History   Diagnosis Date   . Diabetes mellitus    . Chronic kidney disease    . Myocardial infarction    . Hypertension    . Hyperlipidemia    . Peripheral arterial disease      06/28/13 right superficial femoral artery stenosis, tibial peroneal trunk stenosis, left CVL a stenosis, treated with angiography and angioplasty.   . Coronary artery disease      Left circumflex artery presumed DES.,  Chronically occluded right coronary artery stent   . Anemia      She redid to chronic disease and iron deficiency.     . Arterial leg ulcer      2014, receiving home health   . Chronic obstructive pulmonary disease      Previously on Spiriva and Advair       Medications:   Current Facility-Administered Medications   Medication Dose Route Frequency   . [START ON 03/15/2014] amiodarone  200 mg Oral Daily   . [START ON 03/15/2014] amLODIPine  10 mg Oral Daily   . [START ON 03/15/2014] aspirin EC  325 mg Oral Daily   . atorvastatin  10 mg Oral QHS   . [START ON 03/15/2014] cadexomer iodine   Topical Daily   . carvedilol  25 mg Oral Q12H SCH   . [START ON 03/15/2014] furosemide  20 mg Oral Daily   .  gabapentin  600 mg Oral Q8H SCH   . heparin (porcine)  5,000 Units Subcutaneous Q12H [facility]   . hydrALAZINE  75 mg Oral Q12H   . insulin aspart  3 Units Subcutaneous Once   . [START ON 03/15/2014] polyethylene glycol  17 g Oral Daily   . sodium bicarbonate  650 mg Oral TID   . venlafaxine  75 mg Oral BID       Allergies:   Allergies   Allergen Reactions   . Penicillins Anaphylaxis and Angioedema     Tolerates cephalosporins            Family History:   Family History   Problem Relation Age of Onset   . Coronary artery disease Mother      Died at 79 from MI.   Marland Kitchen Coronary artery disease Father      Died at 34 from MI   . Diabetes type II       Mother and father        Review of systems:  Pertinent positives are:  Sternal pain.  Also pain in the right foot when she weightbears.  Not eating much.  No cause with bowel or bladder.  Last bowel movement was yesterday.  No problems with urination.  No problems with swallowing.  A full review of systems was obtained and was otherwise negative.    Physical Examination:   Temperature 97.2, heart rate 81, respiratory rate 18, blood pressure 144/66  There were no vitals taken for this visit.     Estimated body mass index is 29.04 kg/(m^2) as calculated from the following:    Height as of 03/04/14: 1.562 m (5' 1.5").    Weight as of 02/28/14: 70.852 kg (156 lb 3.2 oz).    General appearance: Appears well.  In no acute distress. Normal body habitus. Appears stated age.  Eyes:Anicteric sclerae. Conjunctivae non-injected. EOMI.  HENT:Symmetric facies. Hearing grossly intact. Dentition in poor repair. Moist mucous membranes.  CV:+S1S2 Heart rate and rhythm are regular. 1+ edema in the BLEs.  Right lower limb:  foot and ankle is drressed with Ace bandages and gauze.   Pulm:Lungs are clear to auscultation bilaterally. No wheezes, rales, or rhonchi. Good respiratory effort.  ZOX:WRUE. Non-tender. Normoactive bowel sounds.  Skin:Incision is c/d/i.  Neuro: Speech fluent and appropriate. Sensation intact To light touch.  Cranial nerves II through XII are intact.    Manual muscle strength testing:  Bilateral upper limbs strength 5 out of 5.  Bilateral lower limb strength 5 out of 5.    Psych:Alert Agitated Oriented x 3 Normal mood and affect    Labs:   No results for input(s): GLUCOSEWHOLE in the last 24 hours.      Recent Labs  Lab 03/09/14  0608 03/08/14  0358   WBC 8.32 7.35   HEMOGLOBIN 8.8* 8.5*   HEMATOCRIT 30.1* 28.9*   PLATELETS 181 170          Recent Labs  Lab 03/14/14  0513 03/13/14  0353 03/12/14  0341 03/11/14  0415  03/09/14  0608 03/08/14  0358   SODIUM 141 141 140 140  < > 142 139   POTASSIUM 4.9 5.1 5.0 4.7  < > 4.4 4.7   CHLORIDE  109 110 110 110  < > 112* 111   CO2 26 27 25 24   < > 22 20*   BUN 28.0* 28.0* 28.0* 31.0*  < > 32.0* 39.0*   CREATININE 1.6* 1.7* 1.5* 1.6*  < >  1.6* 1.9*   CALCIUM 8.4* 8.0* 8.2* 7.9*  < > 8.1* 8.1*   GLUCOSE 103* 108* 104* 116*  < > 62* 92   MAGNESIUM  --   --   --   --   --  2.3 2.3   PHOSPHORUS  --   --   --   --   --  2.4 2.6   < > = values in this interval not displayed.    No results for input(s): INR, APTT in the last 168 hours.    Results     ** No results found for the last 24 hours. **               Assessment:     Ambulation and activities of daily living dysfunction due to cardiac debility  Status post MI  Status post coronary artery bypass graft with multivessel coronary artery disease  Blood loss anemia  History of anemia of chronic disease  History of moderate aortic stenosis  History of LVEF 45%  Severe peripheral arterial disease  Right lower limb chronic arterial ulcer  History of COPD      Patient Active Problem List   Diagnosis   . NSTEMI (non-ST elevated myocardial infarction)   . Hypertensive urgency   . PAD (peripheral artery disease)   . Arterial leg ulcer   . Chronic obstructive pulmonary disease, unspecified COPD type   . Ischemic cardiomyopathy   . CAD (coronary artery disease)   . Diabetes   . Chronic kidney disease   . Debilitated       [x]  Requires an intensive inpatient rehabilitation program with multidisciplinary therapies, rehab nursing, and close physician management.    [x]  The following co-morbidities may complicate rehabilitation:  Anemia, Hypoalbuminemia, Malnutrition and Wounds    [x]  Is at risk for the following complications:  Injurious falls, Contracture, Pneumonia, Urinary tract infection, Venous thromboembolic disease, Fecal impaction, Arrhythmia, Myocardial infarction, Hypoglycemia, Limb loss and Wound infection        Individualized Plan of Care:    - REHAB: Begin comprehensive and intensive inpatient rehab program, including:  Physical therapy 60-120 min daily, 5-6  times per week  Occupational therapy  60-120 min daily, 5-6 times per week  Therapeutic recreation  Psychology  Dietician consultation  Case management  Rehabilitation nursing    Will work in an interdisciplinary manner to address the following impairments and issues:   Mobility, ADLs, Impaired ROM, Impaired endurance, Adherence to precautions, Caregiver training, Wounds, Medication management, Adjustment to disability, Leisure skills, [employer] support and resources, Impaired coordination, Impaired balance and Impaired vocal quality    Requires 24h rehabilitation nursing to address:  Bladder care, Bowel care, Glucose control, Hydration needs, Medication teaching, Nutrition, Positioning, Safety, Wounds and Cardiopulmonary reassessments    Anticipate a discharge to:  Home with assistance    Estimated length of stay: 2 weeks      Goals for discharge:  Bed mobility Independent   Transfers Modified independent with LRAD   Locomotion Supervision   Upper body dressing Modified independent with LRAD   Lower body dressing Modified independent with LRAD   Bathing Supervision   Toileting Modified independent with LRAD   Communication Use compensatory strategies to express needs and wants     Swallow Tolerate least restrictive oral diet without signs or symptoms of aspiration     Cognition Use compensatory strategies appropriately to compensate       Rehab potential: good    Prognosis: fair  Potential limitations:Limited caregiver support      Review of Pre-admission assessment  [x]  I have reviewed the nurse liaison's pre-admission assessment in Medil[employer].   I do not note any significant changes at this time and agree with patient's appropriateness for intensive inpatient rehab program.

## 2014-03-14 NOTE — OT Progress Note (Signed)
[facility]   Occupational Therapy Treatment     Patient: [name]    MRN#: [id number]   [facility]  Bed: FI254/FI254-01      Discharge Recommendations:   Discharge Recommendation: Acute Rehab   DME Recommended for Discharge: TBA    If Acute Rehab recommended discharge disposition is not available, patient will need mod assist for ADLs, functional mobility, BSC, shower chair, w/c, hospital bed, RW equipment, and home OT.     Assessment:   Pt is progressing towards goals, demonstrating increased independence with sit to stand, still requires assist for LE dressing secondary to decreased balance in standing. Pt requires verbal cues to maintain and recall sternal precautions throughout session- continued education on performance of ADLs within sternal precautions. Continue OT intervention.    Treatment Activities: ADL retraining, transfer training, pt education    Educated the patient to role of occupational therapy, plan of care, goals of therapy and safety with mobility and ADLs, energy conservation techniques, pursed lip breathing, sternal precautions, discharge instructions, home safety.    Plan:    OT Frequency Recommended: 2-3x/wk     Continue plan of care.       Precautions and Contraindications:   Falls  Sternal    Updated Medical Status/Imaging/Labs:  12/24 Chest XRAY  Impression:Tiny left apical pneumothorax.    Subjective: "I am good."   Patient's medical condition is appropriate for Occupational Therapy intervention at this time.  Patient is agreeable to participation in the therapy session. Nursing clears patient for therapy.    Pain:   Scale: not rated  Location: chest incision  Intervention: RN aware    Objective:   Patient is seated in a cardiac chair with telemetry, IV access, dressing to RLE in place.    Cognition  Alert and oriented. Follows directions.    Functional Mobility  Sit to Stand: CGA-min A  Transfers: min A    Balance  Static Sitting:  supervision  Dynamic Sitting: supervision  Static Standing: CGA  Dynamic Standing: min A    Self Care and Home Management  UE Dressing: independent to don gown overhead  LE Dressing: independent to don/doff socks while seated using cross leg technique, min A to don pants secondary to decreased balance in stnading  Toileting: BSC    Therapeutic Exercises  Exercise with activity    Participation: good  Endurance: fair    Patient left with call bell within reach, all needs met, SCDs not in place, fall mat in place, chair alarm in place and all questions answered. RN notified of session outcome and patient response.     Goals:  Time For Goal Achievement: 5 visits  ADL Goals  Patient will groom self: Supervision, at sinkside, 5 visits  Patient will dress upper body: Goal met  Patient will dress lower body: Supervision, 5 visits  Mobility and Transfer Goals  Pt will perform functional transfers: Stand by Assist, with rolling walker, 5 visits        Executive Fucntion Goals  Pt will follow sternal precautions: independent, to increase ability to complete ADLs, 5 visits                Jacalyn Lefevre, MS, OTR/L  Pager [phone]    Time of Treatment  OT Received On: 03/14/14  Start Time: 0945  Stop Time: 1010  Time Calculation (min): 25 min    Treatment # 2 of 5 visits

## 2014-03-14 NOTE — Progress Notes (Signed)
[facility] Inpatient Rehab Note:    The patient will admit today/date to Unit 5B , room  524-2, report to [phone]    CM/SW notified.     Scotty Court, BSN, RN-BC  [phone]  [facility]-Rehab Admissions Liaison  [phone]

## 2014-03-15 LAB — CBC AND DIFFERENTIAL
Basophils Absolute Automated: 0.02 10*3/uL (ref 0.00–0.20)
Basophils Automated: 0 %
Eosinophils Absolute Automated: 0.51 10*3/uL (ref 0.00–0.70)
Eosinophils Automated: 10 %
Hematocrit: 31 % — ABNORMAL LOW (ref 37.0–47.0)
Hgb: 9 g/dL — ABNORMAL LOW (ref 12.0–16.0)
Immature Granulocytes Absolute: 0.01 10*3/uL
Immature Granulocytes: 0 %
Lymphocytes Absolute Automated: 1.24 10*3/uL (ref 0.50–4.40)
Lymphocytes Automated: 24 %
MCH: 26.1 pg — ABNORMAL LOW (ref 28.0–32.0)
MCHC: 29 g/dL — ABNORMAL LOW (ref 32.0–36.0)
MCV: 89.9 fL (ref 80.0–100.0)
MPV: 9.5 fL (ref 9.4–12.3)
Monocytes Absolute Automated: 0.66 10*3/uL (ref 0.00–1.20)
Monocytes: 12 %
Neutrophils Absolute: 2.85 10*3/uL (ref 1.80–8.10)
Neutrophils: 54 %
Nucleated RBC: 0 /100 WBC (ref 0–1)
Platelets: 275 10*3/uL (ref 140–400)
RBC: 3.45 10*6/uL — ABNORMAL LOW (ref 4.20–5.40)
RDW: 22 % — ABNORMAL HIGH (ref 12–15)
WBC: 5.28 10*3/uL (ref 3.50–10.80)

## 2014-03-15 LAB — URINALYSIS WITH MICROSCOPIC
Bilirubin, UA: NEGATIVE
Blood, UA: NEGATIVE
Glucose, UA: NEGATIVE
Ketones UA: NEGATIVE
Nitrite, UA: NEGATIVE
Protein, UR: 100 — AB
Specific Gravity UA: 1.012 (ref 1.001–1.035)
Urine pH: 7 (ref 5.0–8.0)
Urobilinogen, UA: NEGATIVE mg/dL

## 2014-03-15 LAB — COMPREHENSIVE METABOLIC PANEL
ALT: 12 U/L (ref 0–55)
AST (SGOT): 13 U/L (ref 5–34)
Albumin/Globulin Ratio: 0.9 (ref 0.9–2.2)
Albumin: 2.8 g/dL — ABNORMAL LOW (ref 3.5–5.0)
Alkaline Phosphatase: 73 U/L (ref 37–106)
Anion Gap: 6 (ref 5.0–15.0)
BUN: 27 mg/dL — ABNORMAL HIGH (ref 7–19)
Bilirubin, Total: 0.2 mg/dL (ref 0.2–1.2)
CO2: 26 mEq/L (ref 22–29)
Calcium: 8.5 mg/dL (ref 8.5–10.5)
Chloride: 109 mEq/L (ref 100–111)
Creatinine: 1.7 mg/dL — ABNORMAL HIGH (ref 0.6–1.0)
Globulin: 3 g/dL (ref 2.0–3.6)
Glucose: 113 mg/dL — ABNORMAL HIGH (ref 70–100)
Potassium: 5.3 mEq/L — ABNORMAL HIGH (ref 3.5–5.1)
Protein, Total: 5.8 g/dL — ABNORMAL LOW (ref 6.0–8.3)
Sodium: 141 mEq/L (ref 136–145)

## 2014-03-15 LAB — GLUCOSE WHOLE BLOOD - POCT
Whole Blood Glucose POCT: 109 mg/dL — ABNORMAL HIGH (ref 70–100)
Whole Blood Glucose POCT: 146 mg/dL — ABNORMAL HIGH (ref 70–100)
Whole Blood Glucose POCT: 130 mg/dL — ABNORMAL HIGH (ref 70–100)
Whole Blood Glucose POCT: 130 mg/dL — ABNORMAL HIGH (ref 70–100)

## 2014-03-15 LAB — GFR: EGFR: 36.5

## 2014-03-15 MED ORDER — GABAPENTIN 300 MG PO CAPS
300.0000 mg | ORAL_CAPSULE | Freq: Two times a day (BID) | ORAL | Status: DC
Start: 2014-03-15 — End: 2014-03-25
  Administered 2014-03-15 – 2014-03-25 (×20): 300 mg via ORAL
  Filled 2014-03-15 (×19): qty 1

## 2014-03-15 MED ORDER — LIDOCAINE 5 % EX PTCH
2.00 | MEDICATED_PATCH | CUTANEOUS | Status: DC
Start: 2014-03-15 — End: 2014-03-17
  Administered 2014-03-16: 2 via TRANSDERMAL
  Filled 2014-03-15 (×2): qty 2

## 2014-03-15 NOTE — Rehab Evaluation (Medilinks) (Signed)
NAMEMARILENE [name]  MRN: [id number]  Account: [id number]  Session Start: 03/15/2014 12:00:00 AM  Session Stop: 03/15/2014 12:00:00 AM    Nutrition  Inpatient Rehabilitation Initial Assessment    Rehab Diagnosis: Debility secondary to NSTEMI and multivessel CAD S/P CABG  Demographics:            Age: 41Y            Gender: Female  Primary Language: English    Past Medical History: Diabetes mellitus  Chronic kidney disease  Myocardial infarction  Hypertension  Hyperlipidemia  Peripheral arterial disease 06/28/13 right superficial femoral artery stenosis,  tibial peroneal trunk stenosis, left CVL a stenosis, treated with angiography  and angioplasty.  Coronary artery disease  Left circumflex artery presumed DES., Chronically occluded right coronary artery  stent  Anemia  Arterial leg ulcer 2014, receiving home health  Chronic obstructive pulmonary disease   Past Surgical History  Procedure                                                      Date  Cardiac angiography and angioplasty   06/2013  Colonoscopy                                  Unknown  Tubal ligation                                                  Unknown  History of Present Illness: patient is 89F with h/o DMII, PAD, HTN, CKD, CAD s/p  RCA stent and recent admission to OSH for NSTEMI required ICU admission, she  presented on 12/12 with syncope and elevated troponin, found to have multivessel  CAD,  then transferred transferred from [facility] 12/14 for CV Surgery  consult for CABG. She was on a heparin gtt and her ACEI was placed on hold in  setting of ARF, plavix was held given evaluation for CABG. nitro gtt was started  for hypertensive urgency which was weaned off 12/15 for H/A. Patient also with  severe PAD with chronic RLE arterial ulcer, s/p angioplasty in 06/2013,  clinically improving, but moved 2 weeks ago and has not had wound care since  that time. Wound culture unremarkable from OSH on 12/13.  Prior to hosp patient  had not been taking her  Spiriva or Symbicort for COPD, resumed in hospital. She  is also being treated for asymptomatic bacteriuria and iron deficiency anemia.  She is DMII and her Hgb A1C 7.1 on 12/12.  On 12/18 patient underwent CABG x 2  without            Date of Onset: 02/26/14            Date of Admission: 03/14/2014 6:35:00 PM    Medications and Allergies: Significant rehabilitation considerations:   Please refer to EPIC  Rehabilitation Precautions/Restrictions:   Falls, Infection,    SUBJECTIVE  Patient Reports: My appetite is good.  I have lost some wt in the past few  weeks.  I went from size 18 to 12.  Social History: Marital Status: unknown  Children: 2 daughters, lives  with [name], she works nights , other daughter  lives in the area and can help with patient after D/C from rehab          Reside:  [place] in a 1 Story home with 1 STE  Employment Status:  retired  Recreational Activities/Hobbies:  Patient/Caregiver Goals:  Patient's functional goals: To go home and live  normally  Pain: Patient currently without complaints of pain.    OBJECTIVE  Labs:   Results for [name] (MRN [id number]) as of 03/15/2014 18:18    03/15/2014 05:22  Glucose: 113 (H)  BUN: 27 (H)  Creatinine: 1.7 (H)  Sodium: 141  Potassium: 5.3 (H)  Chloride: 109  CO2: 26  Anion Gap: 6.0  Calcium: 8.5  EGFR: 36.5  Weight: 156 pounds. 70.91 kilograms.  Height:  61 inches. 1.55 meters.  BMI: 29.52 kilogram per meters squared.  Weight Group:  Overweight  Admission Weight: 156    Usual Weight: 165 pounds.  Ideal Body Weight: 105 pounds. 47.73 kilograms.  Percent Ideal Body Weight: Patient's current weight is 148 % of ideal body  weight.  Weight Change: Patient has had no weight change since admission.   Pt does report a wt loss of approx 10# in the last few weeks/months    Diet Intake Prior to Admission: Good appetite, "eats what she wants" at home  Supplements Prior to Admission: NA  Herbals/Vitamins Prior to Admission:  NA    Food  Allergies/Intolerances: No known food allergies.  Religious/Cultural Food Practices: NA  Current Medical/Nutrition Therapy: Diet: Consistent Carbohydrate Calories.   No food consistency restrictions diet with no liquid consistency restrictions.    % of Meals Consumed: 75 %  Feeding Modality and Dentition: oral  Current Problems/GI Symptoms: No Current GI Problems Noted.  Wounds/Incisions:  Chronic R foot ulcer (x 2 yrs) and R leg incision    Estimated Nutrition and Fluids Needs:  9528-4132 calories (25-30 kcal/kg)  56-71 grams of Protein (0.8-1.0 grams/kg)  221-266  grams of carbohydrates (50 % total kcal)  1775-2100 ml fluids (31mL/kcal ml/kg)    ASSESSMENT  Overall Assessment of Nutritional Status:  Current Nutrition Intake: Adequate.  Current Nutrition Status: Adequate.  Nutritional Risk Assessment:  No nutritional risk present.  Nutrition Diagnosis:   NC-2.2 Altered Nutrition Related Laboratory Values: related to renal fxn as  evidenced by elevated BUN/Cr and decreased GFR. .    Interventions Provided/Recommendations:   Assessment Completed.   Recommendations Provided.   Monitor Progress.   Nutritional Adequacy Assessed:    Barriers to Progress/Discharge: No potential barriers to progress.      Pain Reassessment: Pain was not reassessed as no pain was reported.    Interdisciplinary Educational Needs and Learning Preferences:  Education not  assessed/provided this session.    Long Term Goals:  Time frame to achieve long term goal(s): 1 month       1. Maintain meal intake of 75% or more through LOS to best meet est  nutrient needs.  Short Term Goals:  Time frame to achieve short term goal(s): 2 weeks       1. Pt will have abnormal lab values trend toward normal range.    PLAN  Recommendations for Follow-up: Consider diet change to renal/consistent  carbohydrate.  Monitor po intake and tolerance.  Monitor wt changes, fluid  status and lab trends.  Follow up within 7 days.    Care Plan  Identified problems from team  documentation:  Problem: Impaired Endocrine/Metabolic Function  Endocrine: Primary Team Goal: Patient will demonstrate understanding of DM  management to include adm of insulin, s/s of hypo/ hyper glycemia./Active    Problem: Impaired Mobility  Mobility: Primary Team Goal: Patient will perform household functional mobility  mod I using LRAD in order to ensure safe d/c home./Active    Problem: Impaired Self-care Mgmt/ADL/IADL  Self Care: Primary Team Goal: Pt will be Mod I in ADL and functional mobility  and adhere to sternal precautions 100% of the time with no cuing./Active    Problem: Impaired Skin/Wound Mgmt  Skin Wound: Primary Team Goal: Patient and caregiver will be independent of  wound care/Active    Identified problems from this assessment:     No problems identified at this time.    Please review Integrated Patient View Care Plan Flowsheet for Team identified  Problems, Interventions, and Goals.    Signed by: Darcella Cheshire, RD 03/15/2014 6:29:00 PM

## 2014-03-15 NOTE — Consults (Signed)
Reason for Consultation:  POA, Chronic Right Foot Arterial Ulcer      History of Present illness:  63 year old female patient admitted on 03/14/2014 for acute rehab S/P CABG.  PMH significant for DM, COPD CKD stage III, severe PAD with right leg angioplasty and chronic right foot ulcer.  Ulcer has been present for over two years per previous progress notes.  Previously treated by [facility]OCN at [facility] with daily dressings of Medihoney gel      Wound assessment::  Chronic arterial ulcer on medial aspect of right foot measuring 11.0 x 5.0 x 1.0 cm, tissue pink granulation with a moderate amount of fibrin throughout the wound bed, minimal bleeding noted with cleaning the wound, minimal serous drainage noted on dressing. Small skin bridge noted in middle of wound noted to be slightly macerated. Wound cleansed with normal saline, medihoney gel applied to wound bed then covered with 4X4 and kerlix.  Per patient's preference due to ambulating with Physical Therapy, kerlix held in place with loosely applied ace wrap from toe bends to just above the ankle.      Plan:  Monitor and Document changes  Medihoney gel (at bedside), 4X4, kerlix and loosely applied ace wrap from toe bends to just above the ankle applied to right foot ulcer daily      Mallory Shirk Wound Care Nurse, [phone]

## 2014-03-15 NOTE — Rehab Evaluation ([facility]) (Signed)
[name]  MRN: [id number]  Account: [id number]  Session Start: 03/15/2014 12:00:00 AM  Session Stop: 03/15/2014 12:00:00 AM    Rehabilitation Nursing  Inpatient Rehabilitation Admission Assessment    Rehab Diagnosis: Debility secondary to NSTEMI and multivessel CAD S/P CABG  Demographics:            Age: 63Y            Gender: Female  Past Medical History: Diabetes mellitus  Chronic kidney disease  Myocardial infarction  Hypertension  Hyperlipidemia  Peripheral arterial disease 06/28/13 right superficial femoral artery stenosis,  tibial peroneal trunk stenosis, left CVL a stenosis, treated with angiography  and angioplasty.  Coronary artery disease  Left circumflex artery presumed DES., Chronically occluded right coronary artery  stent  Anemia  Arterial leg ulcer 2014, receiving home health  Chronic obstructive pulmonary disease   Past Surgical History  Procedure                                                      Date  Cardiac angiography and angioplasty   06/2013  Colonoscopy                                  Unknown  Tubal ligation                                                  Unknown  History of Infection:   None.  History of Present Illness: [name] with h/o DMII, PAD, HTN, CKD, CAD s/p  RCA stent and recent admission to OSH for NSTEMI required ICU admission, she  presented on 12/12 with syncope and elevated troponin, found to have multivessel  CAD,  then transferred transferred from [facility] 12/14 for CV Surgery  consult for CABG. She was on a heparin gtt and her ACEI was placed on hold in  setting of ARF, plavix was held given evaluation for CABG. nitro gtt was started  for hypertensive urgency which was weaned off 12/15 for H/A. Patient also with  severe PAD with chronic RLE arterial ulcer, s/p angioplasty in 06/2013,  clinically improving, but moved 2 weeks ago and has not had wound care since  that time. Wound culture unremarkable from OSH on 12/13.  Prior to hosp patient  had not  been taking her Spiriva or Symbicort for COPD, resumed in hospital. She  is also being treated for asymptomatic bacteriuria and iron deficiency anemia.  She is DMII and her Hgb A1C 7.1 on 12/12.  On 12/18 patient underwent CABG x 2  without            Date of Onset: 02/26/14            Date of Admission: 03/14/2014 6:35:00 PM    Rehabilitation Precautions Restrictions:   Falls, Infection,  Social History:  Marital Status: Single  Children: 2 daughters, lives with [name], she works nights , other daughter  lives in the area and can help with patient after D/C from rehab          Reside:  [place] in a 1 Story home  with 1 STE  Employment Status:  retired  Recreational Activities/Hobbies:    HEIGHT and WEIGHT  Weight: 156 pounds. 70.91 kilograms. Patient weighed in standing.  Height: 61 inches. 1.55 meters.  BMI: 29.52 kilogram per meters squared.    ORIENTATION  Instructions and information: Administered to: Patient was given instructions  and information on hospital orientation. Orientation was provided for the  following areas: orientation checklist, rehab handbook, bed controls, call  light, chaplain, daily routine, meal times, phone and phone numbers, and  visiting hours.  Primary Language: English  Armband: Correct armband in place.  Valuables/Personal Items:  Patient states that they have valuables and/or  personal items present.  The following valuables and/or personal items are present: glasses, cell phone  The patient received information that facility is not responsible for any  valuables brought in or acquired during hospital stay.  Understanding of Current Condition: I am weak and need rehab so I can go home  and live normally  Patient/Caregiver Goals:  Patient's functional goals: To go home and live  normally    MEDICATIONS AND ALLERGIES  No B/P or Needle Sticks: Not applicable.  Personal Medications: Patient did not bring a personal supply of medications.  Vaccinations:     Vaccination history was  not obtained/unknown.  IV Access: No IV access.  Dialysis Access: Patient does not have dialysis access.    Medication Allergies: Pennicillin  Food Allergies:  Other Allergies:    Pain: Patient currently without complaints of pain.  Pain Reassessment: Pain was not reassessed as no pain was reported.    Elopement Risk Level Assessment Tool  Patient Criteria: Patient is not capable of leaving the unit.  Assessment is not  applicable.      RISK ASSESSMENT FOR FALLS/INJURY    MENTAL STATUS CRITERIA:   0 - None identified.  MENTAL STATUS TOTAL: 0    AGE CRITERIA:   26 - < 63 years old  AGE TOTAL: 0    ELIMINATION CRITERIA:   3 - Toileting with Assistance.   ELIMINATION TOTAL: 3    HISTORY OF FALLS CRITERIA:   2 - Unknown History.  HISTORY OF FALLS TOTAL: 2    MEDICATIONS CRITERIA:   2 or more High Risk Medications (*see list below)   MEDICATIONS TOTAL: 2    PHYSICAL MOBILITY CRITERIA:   1 - Weakness/impaired physical mobility.   PHYSICAL MOBILITY TOTAL: 1    FALLS RISK ASSESSMENT TOTAL: 8    Patient's Fall Risk: TOTAL SCORE is <=10:  Low Risk.    Falls Interventions: Clutter removed and clear path to BR.  Call bell, phone, glasses, etc within reach.  Hourly toileting/safety checks between 6am and 10pm, then every 2 hours.  Initiate Fall care plan and outcome.  Pt and family education.  Assistive devices at [facility].      SEVERE SEPSIS SCREEN  INFECTION:  Patient has no indication of infection.  Negative Sepsis Screen.  If you are unable to assess a system's dysfunction because you do not have labs,  or the labs you have are not current (within 24 hours), call physician and  request and order for the lab tests needed.      Restraint Initial: Patient does not have any restraints at this time.    NUTRITION/DIET  IRF-PAI Swallowing Status: Swallowing Status: Regular Food: solids and liquids  swallowed safely without supervision or modified food consistencies.  IRF-PAI Dehydration: Patient does not display clinical signs of  dehydration.  Nutrition Screen: Patient's  nutrition risk factors include: No risk factors  identified.  Diet: Type: Consistent carbohydrate.  Food Consistency: Regular.  Liquid Consistency: Thin.    PSYCHOSOCIAL  Psychosocial/Abuse Screen: No evidence of neglect/abuse.  Suicide Risk Screen: Patient does not have a primary or secondary behavioral  health diagnosis or complaint.    Current Alcohol Use:  Patient does not consume alcohol.  Current Tobacco Use:  former smoker 1 pack/day for 20 years, Quit 02/29/12  Current Drug Use: No, patient does not use recreational drugs.    REVIEW OF SYSTEMS  Eyes:   Right Eye:  Reactivity: Pupils equal, round and reactive to light and accommodation  (PERRLA).  Sclera Color: Clear   Eye is not draining     Left Eye:  Reactivity: Pupils equal, round and reactive to light and accommodation  (PERRLA).  Sclera Color: Clear   Eye is not draining  Ears: Right Ear: Within normal limits.  Hearing/Communication Device: None.  Mouth: Gums: Moist. Pink.  Tongue: Pink  Teeth: Missing Teeth: Front teeth are missing. Patient has partial at home Oral  hygiene appears to be adequate.    NEURO  Orientation/Awareness: Alert and Oriented x4.  Behavior: Cooperative.  Speech: No deficits noted at this time.    CARDIOVASCULAR     Right Lower Extremity  Nail Bed Color: Pink.   +1 edema; pooling apparent on anterior shins   Left Lower Extremity  Nail Bed Color: Pink.   No edema or redness present. veinastaisis apparent but not as severe on left  leg  Homan's Sign:   Negative bilateral lower extremities  Pulses:   Apical Pulse: Regular. Strong. Rate is 81 .   Patient does not have a pacemaker.   Patient does not have a defibrillator.    CARDIOPULMONARY  Lung Sounds:   Upper lobes. Clear.   Lower lobes. Clear.  Type of Respirations: Regular.  Cough: No cough noted.  Respiratory Support: The patient does not require any respiratory support.  Respiratory Equipment: None.    INTEGUMENTARY  Skin:  Temperature:  Cool  Turgor: Normal for age  Moisture: Dry  Color of skin: Normal for Race/Ethnicity  Capillary Refill: Less than 3 seconds  Wound/Incisions:     Surgical Incision: Sternal incision Length: 17 centimeter(s) with glue. No  signs of infection.  Drainage: Incision without drainage.  Odor:  No  Incision Care: Per protocol.     Surgical Incision: sternal chest area incision Length: 1 centimeter(s) OTA No  signs of infection.  Drainage: Incision without drainage.  Odor:  No  Incision Care: Per protocol.     Surgical Incision: sternal chest area horizontal 2 of 3 incision Length: 1  centimeter(s) OTA No signs of infection.  Drainage: Incision without drainage.  Odor:  No  Incision Care: Per protocol.     Surgical Incision: sternal chest area 3 of 3 incisions incision Length: 1  centimeter(s) OTA No signs of infection.  Drainage: Incision without drainage.  Odor:  No  Incision Care: Per protocol.     Open wound. Right interior ankle extending to foot Tissue Type: Granulation  Tissue. Tissue Type: Granulation Tissue. Tissue Type: Epithelial Tissue. Tissue  Type: Slough-  Length: 15 centimeters    Width: 4 centimeters.    Depth: No, secondary to  necrotic tissue.  Open Surface Area:  60 cm  Undermining: No  Tunneling:  No  Peri-Wound Erythema: No  Skin Color:  Pink or normal for ethnic group  Open Wound Care: Wound care consulted  Rinsed with NS placed Iodoform ointment thin later; cover with non adhesive  tefla; wrap in cotton stretch gauze then ace wrap.  Braden Scale for Predicting Pressure Sore Risk: Sensory Perception: No  impairment  Moisture: Rarely moist  Activity: Walks occasionally  Mobility: Slightly limited  Nutrition: Adequate  Friction and Shear: Potential problem  Braden Score: 19  Level of Risk: No risk (19-23). Will reassess every shift.    GENITOURINARY  Current Bladder Pattern: Continent  Color:  Yellow   Patient denies problems with urination and/or catheter.    Sexuality: The patient denies any  concerns regarding sexuality.    GASTROINTESTINAL  Abdomen: Soft. Nontender.  Bowel Sounds:  Bowel sounds audible in all four quadrants.  Date of Last Bowel Movement:  12/27   No Problems/Complaints with Bowel Elimination Assessed.  The patient has normal bowel activity.  Bowel Movements: The patient has bowel movements daily. Patient does not use  laxatives.    MUSCULOSKELETAL  Hand Dominance:  Right.  Upper Extremities  Impairments/Prosthesis/Devices: Sternal Precautions in place  Lower Extremities  Impairments/Prosthesis/Devices:    FUNCTIONAL MEASURES  Bladder Frequency/Number of Accidents (4 days prior to admission):  Bladder  accidents prior to admission:  0 Patient has not had an accident 4 days prior to  admission.  Bowel Frequency/Number of Accidents (4 days prior to admission):  Bowel  accidents prior to admission:  0 Patient has not had an accident 4 days prior to  admission.    EATING: Eating Score = 7. Patient is completely independent for eating.  There  are no activity limitations.    GROOMING: Activity was not assessed.    BATHING: Patient bathed in bed. Patient requires minimal assistance for washing,  rinsing, or drying the right arm. Patient requires minimal assistance for  washing, rinsing, or drying the left arm. Patient requires minimal assistance  for washing, rinsing, or drying the chest. Patient requires no physical  assistance for washing, rinsing, and drying the abdomen. Patient requires no  physical assistance for washing, rinsing, and drying the perineal area. Patient  requires minimal assistance for washing, rinsing, or drying the buttocks.  Patient requires no physical assistance for washing, rinsing, and drying the  right upper leg. Patient requires no physical assistance for washing, rinsing,  and drying the left upper leg. Patient requires moderate assistance for washing,  rinsing, or drying the right lower leg, including the foot. Patient requires  maximal assistance for washing,  rinsing, or drying the left lower leg, including  the foot. Patient performs 40 % of bathing tasks. Bathing Score = 2, Maximal  Assistance.  Patient requires the following assistive device(s):    UPPER BODY DRESSING: Activity was not assessed.    LOWER BODY DRESSING: Lower Body Dressing was not observed this shift because  patient dressed/undressed in pajamas/gown only.    TOILETING: Toileting Score = 4.  Patient requires minimal assistance for  toileting, such as steadying for balance while cleansing or adjusting clothes.  Patient requires the following assistive device(s):  No assistive devices were  required.    BLADDER MANAGEMENT - LEVEL OF ASSIST: Bladder Score = 5.  Patient is  supervision/set-up for bladder management, requiring: Application of  assistive/adaptive devices.  Setting out equipment.  Emptying equipment.  Patient requires the following assistive device(s): Bedside Commode.    BLADDER ACCIDENTS THIS SHIFT:  0 . Patient has not had an accident but used a  bedside commode this assessment.    BOWEL MANAGEMENT - LEVEL OF  ASSIST: Bowel Score = 6.  Patient is modified  independent for bowel management.  Patient did not have bowel movement.  Medication/intervention was provided.    BOWEL ACCIDENTS THIS SHIFT: 0 . Patient has not had an accident, but used a  stool softener. 0    TRANSFER TOILET: Toilet Transfer Score = 4.  Patient performs 75% or more of  effort and minimal assistance (little/incidental help/steadying) for  transferring to and from the toilet/commode, requiring: Steadying.  Patient requires the following assistive device(s):  Bedside Commode.  Drop arm commode.    TRANSFERS BED/CHAIR/WHEELCHAIR: Activity was not assessed.    IRFPAI GOALS    Eating: 7  Grooming: 4  Bathing; 4  Upper Body Dressing: 4  Lower Body Dressing: 6  Toileting: 7  Bladder: 7  Bowel: 7  Bed, Chair, Wheelchair Transfers: 7  Mode of Bathing: B  Tub/Shower: 6  Toilet Transfers: 7    FUNCTION  Functional Screen:  Patient's functional risk limitations include: Decreased use  of one or both lower extremities.  Decreased use of one or both upper extremities. Therapy consults were ordered.  Discharge Planning Screen: Potential discharge planning issues include: Social  Services/Case Management consult was ordered.    Interdisciplinary Educational Needs and Learning Preferences:       Learning Preference: The patient's preferred learning method is:  Explanation.       Barriers to Learning: Mobility.       Learning Needs: Functional activities/mobility, Pain management, Safety,  Skin/wound care    Education Provided: Pain management. Pain scale. Medication options. Clinical  indicators of pain. Plan of care. Skin/wound care. Signs/symptoms of infection.  Safety issues and interventions. Supervision requirements. Use of adaptive  devices. Bed mobility. Precautions.       Audience: Patient.       Mode: Explanation.       Response: Verbalized understanding.  Needs practice.  Needs reinforcement.    Education Provided: Plan of care.       Audience: Patient.       Mode: Explanation.       Response: Verbalized understanding.    ASSESSMENT  Summary of Rehab Nursing-specific Deficits:   Knowledge Deficit Patient had H A1C of 7.1.  Patient will need education on  diabetic management.   Knowledge Deficit Patient has RLE diabetic ulcer.  Knowledge of signs of  infection, care of wound will need reveiwed.   Safety: Risk for fall   Skin Integrity   Pain (Acute, Chronic, Pain management)  Rehab Potential: Able to participate in an intensive inpatient interdisciplinary  rehabilitation program, Good family/social support, Good premorbid functional  status, Good premorbid medical status, Living in the community premorbidly,  Motivated  Barriers to Progress/Discharge: Reduced insight    Long Term Goals:   Time frame to achieve long term goal(s): Jan 11th (2 weeks)       1. Patient will be able to demonstrate skill in wound care, dressing  changes  and skin monitoring with out queing.       2. Patient will perform wound dressing changes independently.       3. Patient will demonstrate safe transfers from bed to wheelchair, bed to  chair and and bed to bsc using sternal precautions.       4.  Patient will communicate her understandfing of s/s of  wound infection  and when to call the doctor.       5. Patient will demonstrate her understanding of DM and how to  monitor BS,  adm insulin, and s/s of hypo and hyperglycemia.  Short Term Goals:  Time frame to achieve short term goal(s): January 4th, one  week       1. Patient will be able to call 100 percent of the time for help when  getting out of bed. She will communicate her understanding for assistance.       2. Patient will be able to, at the end of the week demonstrate good hand  washing techniques, and wound care for lower right extremity with reach bar if  needed.      PLAN  Nursing-specific Interventions:   Pain Management:   Wound Management:   Skin Management:    TEAM CARE PLAN  Identified problems from team documentation:      Identified problems from this assessment:     Mobility : Patient will demonstrate understanding of correct movements with  sternal precautions for patient to minimize accidents.   Endocrine : Patient will demonstrate understanding of DM management to include  adm of insulin, s/s of hypo/ hyper glycemia.    Discipline:  Nursing    Please review Integrated Patient View Care Plan Flowsheet for Team identified  Problems, Interventions, and Goals.    Signed by: Dolores Hoose, RN 03/15/2014 5:32:00 AM

## 2014-03-15 NOTE — Rehab Evaluation ([facility]) (Signed)
[name]  MRN: [id number]  Account: [id number]  Session Start: 03/15/2014 10:00:00 AM  Session Stop: 03/15/2014 10:30:00 AM    Therapeutic Recreation  Inpatient Rehabilitation Initial Evaluation    Risks/Benefits of Rehabilitation Discussed with Patient/Caregiver: Yes.    Rehab Diagnosis: Debility secondary to NSTEMI and multivessel CAD S/P CABG  Demographics:            Age: 85Y            Gender: Female  Primary Language: English    Past Medical History: Diabetes mellitus  Chronic kidney disease  Myocardial infarction  Hypertension  Hyperlipidemia  Peripheral arterial disease 06/28/13 right superficial femoral artery stenosis,  tibial peroneal trunk stenosis, left CVL a stenosis, treated with angiography  and angioplasty.  Coronary artery disease  Left circumflex artery presumed DES., Chronically occluded right coronary artery  stent  Anemia  Arterial leg ulcer 2014, receiving home health  Chronic obstructive pulmonary disease   Past Surgical History  Procedure                                                      Date  Cardiac angiography and angioplasty   06/2013  Colonoscopy                                  Unknown  Tubal ligation                                                  Unknown  History of Present Illness: patient is 7F with h/o DMII, PAD, HTN, CKD, CAD s/p  RCA stent and recent admission to OSH for NSTEMI required ICU admission, she  presented on 12/12 with syncope and elevated troponin, found to have multivessel  CAD,  then transferred transferred from [facility] 12/14 for CV Surgery  consult for CABG. She was on a heparin gtt and her ACEI was placed on hold in  setting of ARF, plavix was held given evaluation for CABG. nitro gtt was started  for hypertensive urgency which was weaned off 12/15 for H/A. Patient also with  severe PAD with chronic RLE arterial ulcer, s/p angioplasty in 06/2013,  clinically improving, but moved 2 weeks ago and has not had wound care since  that time. Wound  culture unremarkable from OSH on 12/13.  Prior to hosp patient  had not been taking her Spiriva or Symbicort for COPD, resumed in hospital. She  is also being treated for asymptomatic bacteriuria and iron deficiency anemia.  She is DMII and her Hgb A1C 7.1 on 12/12.  On 12/18 patient underwent CABG x 2  without            Date of Onset: 02/26/14            Date of Admission: 03/14/2014 6:35:00 PM    Medications and Allergies: Significant rehabilitation considerations:   sternal precautions  Rehabilitation Precautions/Restrictions:   Falls, Infection,    SUBJECTIVE  Premorbid Functional Level: Patient reported Independent  Understanding of Current Condition: I am weak and need rehab so I can go home  and live normally  Patient/Caregiver  Goals:  Patient's functional goals: To go home and live  normally  Pain: Patient currently without complaints of pain.  Social History:  Marital Status: Single  Children: 2 daughters, lives with [name], she works nights , other daughter  lives in the area and can help with patient after D/C from rehab          Reside:  [place] in a 1 Story home with 1 STE  Employment Status:  retired  Investment banker, corporate:  sewing, bingo    OBJECTIVE  Physical Limitations: decreased functional mobility, decreased endrurance    Cognition/Behavior: alert and oriented    Leisure Interests:  Intellectual: Reading, Games enjoys [employer] Activities:  Television, Card games  Social:  Public affairs consultant  Outdoor: None  Community Involvement: None  Fitness/Sports: None  Creative: Sewing/Quilting goal to return to sewing interests  Other: None  Information Obtained From: Patient    Pain Reassessment: Pain was not reassessed as no pain was reported.    Interdisciplinary Educational Needs and Learning Preferences:  Education not  assessed/provided this session.    ASSESSMENT  Summary of Deficits and Related Problems: Patient presents with the following  deficits: Decreased Activity Level. Decreased  Functional Mobility.  These deficits are secondary to: Impaired Strength.  Rehab Potential: Able to participate in an intensive inpatient interdisciplinary  rehabilitation program, Good premorbid functional status, Living in the  community premorbidly  Barriers to Progress/Discharge: No potential barriers to progress.    Long Term Goals:1.  Patient will be mod independent with table top leisure task.            2.  Patient will tolerate TR activity 2 times per week.    Short Term Goals: Not applicable.    Recommendations/Goals for Rehabilitation Discussed with Patient/Caregiver: Yes.    Patient seen bedside to discuss TR role and goals.   She reports she enjoys  Therapist, music and sewing activities.    PLAN  Therapeutic Recreation services are recommended to address: activity tolerance    Care Plan  Identified problems from team documentation:  Problem: Impaired Endocrine/Metabolic Function  Endocrine: Primary Team Goal: Patient will demonstrate understanding of DM  management to include adm of insulin, s/s of hypo/ hyper glycemia./Active    Problem: Impaired Mobility  Mobility: Primary Team Goal: Patient will demonstrate understanding of correct  movements with sternal precautions for patient to minimize accidents./    Problem: Impaired Self-care Mgmt/ADL/IADL  Self Care: Primary Team Goal: Pt will be Mod I in ADL and functional mobility  and adhere to sternal precautions 100% of the time with no cuing./Active    Problem: Impaired Skin/Wound Mgmt  Skin Wound: Primary Team Goal: Patient and caregiver will be independent of  wound care/Active    Identified problems from this assessment:     No problems identified at this time.    Please review Integrated Patient View Care Plan Flowsheet for Team identified  Problems, Interventions, and Goals.    Signed by: Idolina Primer, CTRS 03/15/2014 11:00:00 AM

## 2014-03-15 NOTE — Progress Notes (Addendum)
[facility]    History:    -HTN with CKD   -Anemia with CKD  -CKD III  -3 vessels CAD; s/p cath and CABG   -PVD with right foot ulcer    Assessment/Plan:    Transfer from [place] with CRF likely secondary to nephrosclerosis. GFR is now at baseline and BP is well controlled. Need to reduce the Neurontin dose because of the reduced GFR and will check a PTH level.    Scheduled Meds:  Current Facility-Administered Medications   Medication Dose Route Frequency   . amiodarone  200 mg Oral Daily   . amLODIPine  10 mg Oral Daily   . aspirin EC  325 mg Oral Daily   . atorvastatin  10 mg Oral QHS   . cadexomer iodine   Topical Daily   . carvedilol  25 mg Oral Q12H SCH   . furosemide  20 mg Oral Daily   . gabapentin  600 mg Oral Q8H SCH   . heparin (porcine)  5,000 Units Subcutaneous Q12H [facility]   . hydrALAZINE  75 mg Oral Q12H   . insulin aspart  3 Units Subcutaneous Once   . lidocaine  2 patch Transdermal Q24H   . polyethylene glycol  17 g Oral Daily   . sodium bicarbonate  650 mg Oral TID   . venlafaxine  75 mg Oral BID       Continuous Infusions:     PRN Meds:acetaminophen, albuterol-ipratropium, bisacodyl, dextrose, glucagon (rDNA), insulin aspart, magnesium hydroxide, oxyCODONE-acetaminophen, traMADol    Subjective:    Responsive and comfortable  HEENT: no visual complaint, cough, hearing deficit  Lungs: no cough, SOB  CV: No chest pain, palpitations  Abd: No constipation, pain, N&V  Ext: right foot pain    Objective:  Vital signs in last 24 hours:  Temp:  [97.2 F (36.2 C)-98.1 F (36.7 C)] 98.1 F (36.7 C)  Heart Rate:  [78-82] 82  Resp Rate:  [16-18] 16  BP: (131-155)/(58-71) 131/58 mmHg    Labs:    Recent Labs  Lab 03/15/14  0522 03/14/14  0513 03/13/14  0353  03/09/14  0608   GLUCOSE 113* 103* 108*  < > 62*   BUN 27* 28.0* 28.0*  < > 32.0*   CREATININE 1.7* 1.6* 1.7*  < > 1.6*   CALCIUM 8.5 8.4* 8.0*  < > 8.1*   SODIUM 141 141 141  < > 142   POTASSIUM 5.3* 4.9  5.1  < > 4.4   CHLORIDE 109 109 110  < > 112*   CO2 26 26 27   < > 22   ALBUMIN 2.8*  --   --   --   --    PHOSPHORUS  --   --   --   --  2.4   MAGNESIUM  --   --   --   --  2.3   < > = values in this interval not displayed.    Recent Labs  Lab 03/15/14  0522 03/09/14  0608   WBC 5.28 8.32   HEMOGLOBIN 9.0* 8.8*   HEMATOCRIT 31.0* 30.1*   MCV 89.9 88.5   MCH, POC 26.1* 25.9*   MCHC 29.0* 29.2*   RDW 22* 21*   MPV 9.5 10.1   PLATELETS 275 181       Weight:  Wt Readings from Last 4 Encounters:   03/14/14 70.852 kg (156 lb 3.2 oz)       Intake/Output:  No intake or output data in the 24 hours ending 03/15/14 1852    Vital Signs:  Patient Vitals for the past 24 hrs:   BP Temp Temp src Pulse Resp SpO2   03/15/14 0811 131/58 mmHg - - - - -   03/15/14 0810 144/71 mmHg - - 82 16 97 %   03/15/14 0602 155/69 mmHg - - 78 - 96 %   03/15/14 0526 154/70 mmHg 98.1 F (36.7 C) Oral 80 18 97 %   03/14/14 1942 144/66 mmHg 97.2 F (36.2 C) Oral 81 18 97 %       Physical Exam:    General appearance and mental status - in no distress  Eyes - pupils equal, sclera clear  Neck - supple  Chest - clear to auscultation, no wheezes, rales or rhonchi  Heart - regular rate and rhythm, no murmurs or rubs  Abdomen - soft, nontender, no masses or organomegaly  Extremities - right foot edema and ulcer  Skin - no rashes    Verlene Mayer, MD  [facility] [employer].  [phone]

## 2014-03-15 NOTE — Progress Notes (Signed)
PHYSICAL MEDICINE AND REHABILITATION  PROGRESS NOTE    Date Time: 03/15/2014 11:20 PM  Patient Name: [name], [name]    Admission date:  03/14/2014      Subjective:     Patient with fairly controlled pain in the right lower limb.  Sternal discomfort is stable.  She reports that she is eating and drinking more.  No issues with sleeping.    Functional Status:     PT:  TRANSFERS BED/CHAIR/WHEELCHAIR: Bed/chair/wheelchair Transfer Score = 4.  Patient performs 75% or more of effort and minimal assistance (little/incidental  help/lifting of one limb/steadying) for transferring to and from the  bed/chair/wheelchair, requiring: Steadying.  Patient requires the following assistive device(s): No assistive devices were  required. Pt pt performed supine to and from sit, and bilateral rolling with  supervision and VCs for log roll technique. Pt transfers with CGA for steadying,  without AD and with use of RW.  LOCOMOTION WHEELCHAIR:  Wheelchair locomotion was observed using a manual wheelchair. Wheelchair  Distance Scale = 2. Distance traveled in wheelchair is 50 -149 feet. Wheelchair  Score = 2. Patient is able to go at least 50 feet (household distance) in  wheelchair but requires assistance. Patient was able to propel a distance of  100 feet in a wheelchair. No assistive devices required. Pt uses LEs for  propelling w/c over even tiled and carpeted surface, in order to maintain  sternal precautions.  LOCOMOTION WALK:  Walk Distance Scale = 1. Distance walked is less than 50 feet. Walk Score = 1.  Incidental assistance with lifting, contact guard or steadying was provided.  Patient performs 75% or more of effort and requires minimal contact assistance  for walking. Patient walked a distance of 40 feet. Rolling walker. Pt ambulates  over even surface using RW with tactile cues for maintaining sternal precautions  on RW. Farther ambulation limited due to c/o fatigue.  LOCOMOTION STAIRS: Stairs did not occur because activity was  unsafe for patient.  pt unsafe due to sternal precautions at this time.  COMPREHENSION: Auditory comprehension is the usual mode. Comprehension Score =  7, Independent. Patient comprehends complex/abstract information in their  primary language. Patient is completely independent for auditory comprehension.  There are no activity limitations.  EXPRESSION: Vocal expression is the usual mode. Expression Score = 7,  Independent. Patient expresses complex/abstract information in their primary  language. Patient is completely independent for vocal expression. There are no  activity limitations.  SOCIAL INTERACTION: Social Interaction Score = 7, Independent. Patient is  completely independent for social interaction. There are no activity  limitations.  PROBLEM SOLVING: Problem Solving Score = 7, Independent. Patient makes  appropriate decisions in order to solve complex problems. Patient is completely  independent for problem solving. There are no activity limitations.  MEMORY: Memory Score = 7, Independent. Patient is completely independent for  memory. There are no activity limitations.    OT:  EATING: Eating Score = 7. Patient is completely independent for eating. There  are no activity limitations.  GROOMING: Grooming Score = 5. Patient is supervision/set-up for grooming,  requiring: Stand by assistance.  Patient requires the following assistive device(s) No assistive devices were  required. seated  BATHING: Patient bathed in shower. Bathing Score = 4. Patient requires minimal  assistance for bathing, requiring steadying for balance only. Patient requires  the following assistive device(s): Grab bar/arm rest to maintain balance.  Patient requires the following assistive device(s): Hand held shower.  UPPER BODY DRESSING: Upper Body  Dressing Score = 5. Patient is  supervision/set-up for upper body dressing, requiring: Stand by assistance.  Verbal cuing, prompting, or instructing.  Gathering/setting out clothes.  Patient  requires the following assistive device(s): No assistive devices were  required.  LOWER BODY DRESSING: Lower Body Dressing Score = 4. Patient requires minimal  assistance for lower body dressing, requiring steadying for balance only.  Patient requires the following assistive device(s): No assistive devices were  required.  TOILETING: Toileting Score = 4. Patient requires minimal assistance for  toileting, such as steadying for balance while cleansing or adjusting clothes.  Patient requires the following assistive device(s): Grab bar.  BLADDER MANAGEMENT - LEVEL OF ASSIST: Bladder Score = 7. Patient is completely  independent for bladder management. There are no activity limitations.  BLADDER ACCIDENTS THIS SHIFT: 0 . Patient has not had an accident this  assessment and did not require medications or devices.  BOWEL MANAGEMENT - LEVEL OF ASSIST: Bowel Score = 7. Patient is completely  independent for bowel management. There are no activity limitations.  BOWEL ACCIDENTS THIS SHIFT: 0 . Patient has not had an accident and did not  require medications or devices. "pretty sure I went"  TRANSFERS BED/CHAIR/WHEELCHAIR: Bed/chair/wheelchair Transfer Score = 4.  Patient performs 75% or more of effort and minimal assistance (little/incidental  help/lifting of one limb/steadying) for transferring to and from the  bed/chair/wheelchair, requiring: Contact guard. Steadying.  Patient requires the following assistive device(s): Elevated bed/surface.    Medications:   Medication reviewed by me:     Scheduled Meds: PRN Meds:      amiodarone 200 mg Oral Daily   amLODIPine 10 mg Oral Daily   aspirin EC 325 mg Oral Daily   atorvastatin 10 mg Oral QHS   cadexomer iodine  Topical Daily   carvedilol 25 mg Oral Q12H SCH   furosemide 20 mg Oral Daily   gabapentin 300 mg Oral BID   heparin (porcine) 5,000 Units Subcutaneous Q12H SCH   hydrALAZINE 75 mg Oral Q12H   insulin aspart 3 Units Subcutaneous Once   lidocaine 2 patch Transdermal Q24H    polyethylene glycol 17 g Oral Daily   sodium bicarbonate 650 mg Oral TID   venlafaxine 75 mg Oral BID       Continuous Infusions:      acetaminophen 650 mg Q6H PRN   albuterol-ipratropium 3 mL Q2H PRN   bisacodyl 10 mg QD PRN   dextrose 25 mL PRN   glucagon (rDNA) 1 mg PRN   insulin aspart 1-8 Units PRN   magnesium hydroxide 10 mL Q6H PRN   oxyCODONE-acetaminophen 1 tablet Q4H PRN   traMADol 50 mg Q6H PRN           Review of Systems:   A comprehensive review of systems was: No fevers, chills, nausea, vomiting, chest pain, shortness of breath, cough, headache, double vision.  All others negative.    Physical Exam:     Filed Vitals:    03/15/14 0602 03/15/14 0810 03/15/14 0811 03/15/14 1950   BP: 155/69 144/71 131/58 138/61   Pulse: 78 82  75   Temp:    97.9 F (36.6 C)   TempSrc:       Resp:  16  18   SpO2: 96% 97%  94%       Intake and Output Summary (Last 24 hours) at Date Time  No intake or output data in the 24 hours ending 03/15/14 2320  Cardiac: regular rhythm  Chest / Lungs:  Clear to auscultation.  Abdomen:  + bowel sounds, Soft, non-tender, non-distended.  Extremities: no calf tenderness.  Right dorsal foot with dressing and Ace.    Labs:   No results for input(s): GLUCOSEWHOLE in the last 24 hours.      Recent Labs  Lab 03/15/14  0522 03/09/14  0608   WBC 5.28 8.32   HEMOGLOBIN 9.0* 8.8*   HEMATOCRIT 31.0* 30.1*   PLATELETS 275 181          Recent Labs  Lab 03/15/14  0522 03/14/14  0513 03/13/14  0353 03/12/14  0341  03/09/14  0608   SODIUM 141 141 141 140  < > 142   POTASSIUM 5.3* 4.9 5.1 5.0  < > 4.4   CHLORIDE 109 109 110 110  < > 112*   CO2 26 26 27 25   < > 22   BUN 27* 28.0* 28.0* 28.0*  < > 32.0*   CREATININE 1.7* 1.6* 1.7* 1.5*  < > 1.6*   CALCIUM 8.5 8.4* 8.0* 8.2*  < > 8.1*   ALBUMIN 2.8*  --   --   --   --   --    PROTEIN, TOTAL 5.8*  --   --   --   --   --    BILIRUBIN, TOTAL 0.2  --   --   --   --   --    ALKALINE PHOSPHATASE 73  --   --   --   --   --    ALT 12  --   --    --   --   --    AST (SGOT) 13  --   --   --   --   --    GLUCOSE 113* 103* 108* 104*  < > 62*   MAGNESIUM  --   --   --   --   --  2.3   PHOSPHORUS  --   --   --   --   --  2.4   < > = values in this interval not displayed.    No results for input(s): INR, APTT in the last 168 hours.    Results     Procedure Component Value Units Date/Time    Glucose Whole Blood - POCT [[id number]]  (Abnormal) Collected:  03/15/14 2139     POCT - Glucose Whole blood 130 (H) mg/dL Updated:  40/98/11 9147    Glucose Whole Blood - POCT [[id number]]  (Abnormal) Collected:  03/15/14 1603     POCT - Glucose Whole blood 130 (H) mg/dL Updated:  86/57/84 6962    Urine culture [[id number]] Collected:  03/15/14 0549    Specimen Information:  Urine / Urine, Clean Catch Updated:  03/15/14 1221    Glucose Whole Blood - POCT [[id number]]  (Abnormal) Collected:  03/15/14 1120     POCT - Glucose Whole blood 146 (H) mg/dL Updated:  66/44/03 4742    Comprehensive metabolic panel [[id number]]  (Abnormal) Collected:  03/15/14 0522    Specimen Information:  Blood Updated:  03/15/14 0623     Glucose 113 (H) mg/dL      BUN 27 (H) mg/dL      Creatinine 1.7 (H) mg/dL      Sodium 433 mEq/L      Potassium 5.3 (H) mEq/L      Chloride 109 mEq/L      CO2 26 mEq/L  CALCIUM 8.5 mg/dL      Protein, Total 5.8 (L) g/dL      Albumin 2.8 (L) g/dL      AST (SGOT) 13 U/L      ALT 12 U/L      Alkaline Phosphatase 73 U/L      Bilirubin, Total 0.2 mg/dL      Globulin 3.0 g/dL      Albumin/Globulin Ratio 0.9      Anion Gap 6.0     GFR [[id number]] Collected:  03/15/14 0522     EGFR 36.5 Updated:  03/15/14 0623    Glucose Whole Blood - POCT [[id number]]  (Abnormal) Collected:  03/15/14 0559     POCT - Glucose Whole blood 109 (H) mg/dL Updated:  78/29/56 2130    Urinalysis with microscopic [[id number]]  (Abnormal) Collected:  03/15/14 0549    Specimen Information:  Urine Updated:  03/15/14 0604     Urine Type Clean Catch      Color, UA Yellow      Clarity, UA Clear      Specific  Gravity UA 1.012      Urine pH 7.0      Leukocyte Esterase, UA Small (A)      Nitrite, UA Negative      Protein, UR 100 (A)      Glucose, UA Negative      Ketones UA Negative      Urobilinogen, UA Negative mg/dL      Bilirubin, UA Negative      Blood, UA Negative      RBC, UA 0 - 5 /hpf      WBC, UA 0 - 5 /hpf      Squamous Epithelial Cells, Urine 0 - 5 /hpf     CBC and differential [[id number]]  (Abnormal) Collected:  03/15/14 0522    Specimen Information:  Blood / Blood Updated:  03/15/14 0544     WBC 5.28 x10 3/uL      Hgb 9.0 (L) g/dL      Hematocrit 44.0 (L) %      Platelets 275 x10 3/uL      RBC 3.45 (L) x10 6/uL      MCV 89.9 fL      MCH 26.1 (L) pg      MCHC 29.0 (L) g/dL      RDW 22 (H) %      MPV 9.5 fL      Neutrophils 54 %      Lymphocytes Automated 24 %      Monocytes 12 %      Eosinophils Automated 10 %      Basophils Automated 0 %      Immature Granulocyte 0 %      Nucleated RBC 0 /100 WBC      Neutrophils Absolute 2.85 x10 3/uL      Abs Lymph Automated 1.24 x10 3/uL      Abs Mono Automated 0.66 x10 3/uL      Abs Eos Automated 0.51 x10 3/uL      Absolute Baso Automated 0.02 x10 3/uL      Absolute Immature Granulocyte 0.01 x10 3/uL     MRSA culture [[id number]] Collected:  03/14/14 2301    Specimen Information:  Body Fluid / Nares and Throat Updated:  03/15/14 0422               Rads:   Radiological Procedure reviewed.  Radiology Results (24 Hour)     **  No results found for the last 24 hours. **              Assessment and Plan:     Ambulation and activities of daily living dysfunction due to cardiac debility  Status post MI  Status post coronary artery bypass graft with multivessel coronary artery disease  - Continue with PT and OT.  Follow gains.  - We had a team conference today.  Please see [facility] notes.  - Estimated length of stay is about 2 weeks.  Blood loss anemia  - Follow trend.  History of anemia of chronic disease  Chronic kidney disease III  - Appreciated nephrology efforts.  Check PTH  level.  - Reduced the gabapentin dosages.  History of moderate aortic stenosis  History of LVEF 45%  Severe peripheral arterial disease  Right lower limb chronic arterial ulcer  - Continue with local care.  Appreciated wound care nurse efforts.  Continue with [facility].    History of COPD    Patient Active Problem List   Diagnosis   . NSTEMI (non-ST elevated myocardial infarction)   . Hypertensive urgency   . PAD (peripheral artery disease)   . Arterial leg ulcer   . Chronic obstructive pulmonary disease, unspecified COPD type   . Ischemic cardiomyopathy   . CAD (coronary artery disease)   . Diabetes   . Chronic kidney disease   . Debilitated       Continue comprehensive and intensive inpatient rehab program, including:   Physical therapy 60-120 min daily, 5-6 times per week, Occupational therapy 60-120 min daily, 5-6 times per week, Case management and Rehabilitation nursing    Signed by: [name] MD    [facility]

## 2014-03-15 NOTE — Rehab Progress Note ([facility]) (Signed)
[name]  MRN: [id number]  Account: [id number]  Session Start: 03/15/2014 12:00:00 AM  Session Stop: 03/15/2014 12:00:00 AM    Rehabilitation Nursing  Inpatient Rehabilitation Shift Assessment    Rehab Diagnosis: Debility secondary to NSTEMI and multivessel CAD S/P CABG  Demographics:            Age: 63Y            Gender: Female  Primary Language: English    Date of Onset:  02/26/14  Date of Admission: 03/14/2014 6:35:00 PM    Rehabilitation Precautions Restrictions:   Falls, Infection,    Patient Report: Hi  Pain: Patient currently has pain.  Location: Anterior chest  Type: Acute  Quality: Burning.  Pain Scale: Numeric.  Patient reports a pain level of 7 out of 10.  Patient's acceptable level of pain 0 out of 10.   Interferes with physical activity.  Pain is alleviated by: Medication  Pain is exacerbated by: Movements Patient medicated.  Pain Reassessment:  Response to Pain Intervention:  Post Intervention Pain Quality:  None.  Patient Reports Post Intervention Pain Level of: 1 out of 10  Pain Acceptable: No:  Patient/Caregiver Goals:  To go home and live normally    NEURO  Orientation/Awareness: Alert and Oriented x3.  Speech: No deficits noted at this time.  Behavior: Cooperative.    MEDICATIONS  IV Access: No IV access.  Dialysis Access: Patient does not have dialysis access.      Elopement Risk Level Assessment Tool  Patient Criteria: Patient is not capable of leaving the unit.  Assessment is not  applicable.      RISK ASSESSMENT FOR FALLS/INJURY    MENTAL STATUS CRITERIA:   0 - None identified.  MENTAL STATUS TOTAL: 0    AGE CRITERIA:   79 - < 88 years old  AGE TOTAL: 0    ELIMINATION CRITERIA:   3 - Toileting with Assistance.   ELIMINATION TOTAL: 3    HISTORY OF FALLS CRITERIA:   2 - Unknown History.  HISTORY OF FALLS TOTAL: 2    MEDICATIONS CRITERIA:   2 or more High Risk Medications (*see list below)   MEDICATIONS TOTAL: 2    PHYSICAL MOBILITY CRITERIA:   1 - Weakness/impaired physical mobility.    PHYSICAL MOBILITY TOTAL: 1    FALLS RISK ASSESSMENT TOTAL: 8    Patient's Fall Risk: TOTAL SCORE is <=10:  Low Risk.    Falls Interventions: Clutter removed and clear path to BR.  Call bell, phone, glasses, etc within reach.  Hourly toileting/safety checks between 6am and 10pm, then every 2 hours.  Yellow "high risk" patient identification in place: wrist band, socks, chart  sticker, door sign.  Assistive devices at [facility].  Pharmacy review of meds.    NUTRITION  Diet:  Type: Consistent carbohydrate.  Food Consistency: Regular.  Liquid Consistency: Thin.      CARDIOVASCULAR     Bilateral lower extremities  Nail Bed Color: Pink.   +1 edema on anterior shins  Pulses:   Apical Pulse: Regular. Strong. Rate is 82 .   Patient does not have a pacemaker.   Patient does not have a defibrillator.    CARDIOPULMONARY  Lung Sounds:   Upper lobes. Clear.   Lower lobes. Clear.  Type of Respirations: Regular.  Cough: No cough noted.  Respiratory Support: The patient does not require any respiratory support.  Respiratory Equipment: 97%, RA.    INTEGUMENTARY  Skin:  Temperature: Warm  Turgor: Normal for age  Moisture: Dry  Color of skin: Normal for Race/Ethnicity  Capillary Refill: Less than 3 seconds  Wounds/Incisions:     Surgical Incision: Sternal incision incision Length: 17 centimeter(s) with  glue. No signs of infection.  Drainage: Incision without drainage.  Odor:  No  Incision Care: Per protocol.     Surgical Incision: Sternal chest area incision incision Length: 1 centimeter(s)  OTA No signs of infection.  Drainage: Incision without drainage.  Odor:  No  Incision Care: Per protocol.     Surgical Incision: Sternal 2 of 3 incision Length: 1 centimeter(s) OTA No signs  of infection.  Drainage: Incision without drainage.  Odor:  No  Incision Care: Per protocol.     Surgical Incision: Sternal chest area 3 of 3 incision Length: 1 centimeter(s)  OTA No signs of infection.  Drainage: Incision without drainage.  Odor:  No  Incision Care:  Per protocol.     Open wound. Right interior ankle Tissue Type: Granulation Tissue. Tissue Type:  Slough-  Length: 15 centimeters    Width: 4 centimeters.    Depth: No, secondary to  necrotic tissue.  Open Surface Area:  60 cm  Undermining: No  Tunneling:  No  Peri-Wound Erythema: No  Skin Color:  Pink or normal for ethnic group  Open Wound Care: Care provided per protocol.  Braden Scale for Predicting Pressure Sore Risk: Sensory Perception: No  impairment  Moisture: Rarely moist  Activity: Walks occasionally  Mobility: Slightly limited  Nutrition: Adequate  Friction and Shear: Potential problem  Braden Score: 19  Level of Risk: No risk (19-23). Will reassess every shift.    GASTROINTESTINAL  Abdomen: Soft. Nontender.  Bowel Sounds:  Active bowel sounds audible in all four quadrants.  Date of Last Bowel Movement:  03/13/14   No Problems/Complaints with Bowel Elimination Assessed.    GENITOURINARY  Current Bladder Pattern: Continent  Color:  Yellow   Patient denies problems with urination and/or catheter.    MUSCULOSKELETAL  Upper Body: Sternal Precautions in place  Lower Body:    Functional Measures    EATING: Eating Score = 7. Patient is completely independent for eating.  There  are no activity limitations.    BLADDER MANAGEMENT - LEVEL OF ASSIST: Bladder Score = 5.  Patient is  supervision/set-up for bladder management, requiring: Application of  assistive/adaptive devices.  Setting out equipment.  Emptying equipment.  Patient requires the following assistive device(s): Bedside Commode.    BLADDER ACCIDENTS THIS SHIFT:  0 . Patient has not had an accident this  assessment and did not require medications or devices.    BOWEL MANAGEMENT - LEVEL OF ASSIST: Bowel Score = 6. Patient is modified  independent for bowel management requiring: Stool softeners    BOWEL ACCIDENTS THIS SHIFT: 0 . Patient has not had an accident and did not  require medications or devices.    Education Provided:    Education Provided:  Precautions. Pain management. Pain scale. Medication  options. Side effects. Clinical indicators of pain. Safety issues and  interventions. Fall protocol. Skin/wound care. Signs/symptoms of infection. Role  of nutrition in wound prevention/healing. Incision care. Prevention of skin  breakdown.       Audience: Patient.       Mode: Explanation.       Response: Applied knowledge.  Verbalized understanding.    Discharge: Patient is not being discharged at this time.    Long Term Goals:  1. Patient will be able  to demonstrate skill in wound care,  dressing changes and skin monitoring with out queing.   2. Patient will perform wound dressing changes independently.   3. Patient will demonstrate safe transfers from bed to wheelchair, bed to chair  and and bed to bsc using sternal precautions.   4. Patient will communicate her understandfing of s/s of  wound infection and  when to call the doctor.   5. Patient will demonstrate her understanding of DM and how to monitor BS, adm  insulin, and s/s of hypo and hyperglycemia.  Jan 11th (2 weeks)  Short Term Goals:  1. Patient will be able to call 100 percent of the time for  help when getting out of bed. She will communicate her understanding for  assistance.   2. Patient will be able to, at the end of the week demonstrate good hand  washing techniques, and wound care for lower right extremity with reach bar if  needed.  January 4th, one week    PROGRESS TOWARD GOALS: SHORT TERM GOAL REVIEW:       1. Patient will be able to call 100 percent of the time for help when  getting out of bed. She will communicate her understanding for assistance. - Not  Met: Pt is reminded to calol for help or assistance to the bathroom  Time frame to achieve short term goal(s):  January 4th, one week    PLAN: Nursing Specific Interventions  Pain Management. Skin Management. Wound Management.  Continue with the current Nursing Plan of Care.    TEAM CARE PLAN  Identified problems from team  documentation:  Problem: Impaired Endocrine/Metabolic Function  Endocrine: Primary Team Goal: Patient will demonstrate understanding of DM  management to include adm of insulin, s/s of hypo/ hyper glycemia./    Problem: Impaired Mobility  Mobility: Primary Team Goal: Patient will demonstrate understanding of correct  movements with sternal precautions for patient to minimize accidents./    Add/Update Problems from this assessment:  No updates at this time.    Please review Integrated Patient View Care Plan Flowsheet for Team identified  Problems, Interventions, and Goals.    Signed by: Coralyn Pear, RN 03/15/2014 1:00:00 PM

## 2014-03-15 NOTE — Rehab Evaluation ([facility]) (Addendum)
Corrected 03/15/2014 12:27:59 PM    NAME: [name]  MRN: [id number]  Account: [id number]  Session Start: 03/15/2014 8:00:00 AM  Session Stop: 03/15/2014 10:00:00 AM    Occupational Therapy  Inpatient Rehabilitation Evaluation    Rehab Diagnosis: Debility secondary to NSTEMI and multivessel CAD S/P CABG  Demographics:            Age: 74Y            Gender: Female  Primary Language: English    Past Medical History: Diabetes mellitus  Chronic kidney disease  Myocardial infarction  Hypertension  Hyperlipidemia  Peripheral arterial disease 06/28/13 right superficial femoral artery stenosis,  tibial peroneal trunk stenosis, left CVL a stenosis, treated with angiography  and angioplasty.  Coronary artery disease  Left circumflex artery presumed DES., Chronically occluded right coronary artery  stent  Anemia  Arterial leg ulcer 2014, receiving home health  Chronic obstructive pulmonary disease   Past Surgical History  Procedure                                                      Date  Cardiac angiography and angioplasty   06/2013  Colonoscopy                                  Unknown  Tubal ligation                                                  Unknown  History of Present Illness: patient is 82F with h/o DMII, PAD, HTN, CKD, CAD s/p  RCA stent and recent admission to OSH for NSTEMI required ICU admission, she  presented on 12/12 with syncope and elevated troponin, found to have multivessel  CAD,  then transferred from [facility] 12/14 for CV Surgery consult for  CABG. She was on a heparin gtt and her ACEI was placed on hold in setting of  ARF, plavix was held given evaluation for CABG. nitro gtt was started for  hypertensive urgency which was weaned off 12/15 for H/A. Patient also with  severe PAD with chronic RLE arterial ulcer, s/p angioplasty in 06/2013,  clinically improving, but moved 2 weeks ago and has not had wound care since  that time. Wound culture unremarkable from OSH on 12/13.  Prior to hosp  patient  had not been taking her Spiriva or Symbicort for COPD, resumed in hospital. She  is also being treated for asymptomatic bacteriuria and iron deficiency anemia.  She is DMII and her Hgb A1C 7.1 on 12/12.  On 12/18 patient underwent CABG x 2  without any major complications.  12/20 patient had a rise in creatinine to 2.2,  she had episodes of HTN, Anemia, and metabolic acidosis, she was on an Insulin  gtt.  By 12/23 her labs started normalizing.  She was scheduled to undergo LE  angiogram on 12/24 and the physician canceled the procedure and wants it done  outpatient unless it's an emergent procedure. Rationale was to allow her renal  function to recover before subjecting her to another insult to the kidney with  IV contrast. Patient remained  stable through the weekend and is ready for  admission to acute rehab today.   Date of Onset: 02/26/14   Date of Admission: 03/14/2014 6:35:00 PM  Premorbid Functional Level: Independent, daughter was driving her to any  appointments. daughter is no longer able to drive her secondary to eye surgery,  reports having friends available for transportation. used RW at home  Social/Educational History: enjoyed shopping with friends and playing bingo. no  longer able to spend as much time with friends secondary to decreased tolerance  of WB and mobility from R foot ulcer  Home Environment: Lives with daughter in [facility], tub, couple STE    Medications and Allergies: Significant rehabilitation considerations:   Penicillins  Rehabilitation Precautions/Restrictions:   STERNAL precautions  falls  infection    SUBJECTIVE  Patient Report: I've been in and out of the hospital for  a long time  Patient/Caregiver Goals:  Patient's functional goals: To go home and live  normally  Pain: Patient currently has pain.  Location: chest and R foot  Type: Acute  Quality: Aching.  Tender.  Pain Scale: Numeric.  Patient reports a pain level of 6 out of 10.  Patient's acceptable level of pain 0 out of  10.   Interferes with physical activity.  Pain is alleviated by: rest  Pain is exacerbated by: activity Patient medicated. Repositioned patient.      OBJECTIVE  General Observation: Pt recieved supine in bed and agreeable to therapy. OT  orders reviewed and acknowledged. Pt left supine in bed with all needs within  reach. RN aware  Understanding of Current Condition: I am weak and need rehab so I can go home  and live normally  Vital Signs:                        Current Value                Previous Value  Vitals  BP Systolic          131                          155/69  BP Diastolic         58                           -  Pulse                84                           78  Respirations         -                            18  O2 Saturation        97                           -    Cognition: WFL, AandOx3, able to recall 3/3 items immediately and after delay,  able to recall and adhere to sternal precautions with min cuing.  Vision:  WFL, reading glasses,able to read near and far point  Perception: WFL    Upper Extremity Status   Tone: no abnormal tone noted   Range of Motion: UE [facility]  within sternal precautions   Fine Motor: WFL      Strength/Motor Control: WFL within sternal precautions  Sensation: denies changes    Interventions:       Self Care/Home Management:  Discussed bathroom AE and home set up. Pt has  close vanity and higher than normal toilet, but not elevated. Pt has tub and  shower seat. Pt unable to WB safety through R LE for safe step over transfer. Pt  practiced transfer with tub transfer bench with S from [facility] during 2 trials. Pt  completed mass practice of sit<>stand from progressively lowering mat following  sternal precautions. Pt requires S for higher surfaces and CGA for lower  surfaces with minimal LOB from low surface. Pt completed mass practice of seated  <> supine from flat mat following sternal precautions. Pt required S and min  cues for technique. Attempted to demo unsafe technique using  arms to come from  supine. After instruction, able to come to seated from supine without use of  arms to adhere to sternal precautions. Pt would benefit from continued practice  with sternal precautions.    Interdisciplinary Educational Needs and Learning Preferences:       Learning Preference: The patient's preferred learning method is:  Explanation.  The patient's preferred learning method is: Demonstration.       Barriers to Learning: No barriers.       Learning Needs: Equipment, Functional activities/mobility,  Leisure/vocational, Pain management, Plan of care, Precautions, Rehabilitation  techniques and procedures, Safety, Skin/wound care    Education Provided: Precautions. Plan of care. Activities of daily living. Bed  mobility. Functional transfers. Safety.       Audience: Patient.       Mode: Explanation.  Demonstration.       Response: Applied knowledge.  Verbalized understanding.  Needs practice.  Needs reinforcement.    ASSESSMENT  Summary of Deficits and Prognosis: Pt presents to inpatient rehab with decreased  endurance and strength which impacts her independence in ADLs and functional  mobility following CABGx2, as well as decreased balance and ability to tolerate  weight bearing due to R foot ulcer (dorsal/medial) which impacts safety during  functional mobility. Pt requires min cuing to adhere to  sternal precautions  during bed mobility. Pt would benefit from tub transfer bench at home for safe  transfer. Pt benefits from skilled OT services to increase safety and  independence in ADLs/IADLS and functional mobility following sternal precautions  and pain management to increase weight bearing through R foot .  Rehab Potential: Able to participate in an intensive inpatient interdisciplinary  rehabilitation program, Good family/social support, Good premorbid functional  status, Living in the community premorbidly, Motivated  Barriers to Progress/Discharge: Architectural barriers in home    Functional  Measures    EATING: Eating Score = 7. Patient is completely independent for eating.  There  are no activity limitations.    GROOMING: Grooming Score = 5. Patient is supervision/set-up for grooming,  requiring: Stand by assistance.  Patient requires the following assistive device(s) No assistive devices were  required. seated    BATHING: Patient bathed in shower. Bathing Score = 4.  Patient requires minimal  assistance for bathing, requiring steadying for balance only. Patient requires  the following assistive device(s): Grab bar/arm rest to maintain balance.  Patient requires the following assistive device(s): Hand held shower.    UPPER BODY DRESSING: Upper Body Dressing Score = 5. Patient is  supervision/set-up for upper body dressing, requiring: Stand by  assistance.  Verbal cuing, prompting, or instructing.  Gathering/setting out clothes.  Patient requires the following assistive device(s):  No assistive devices were  required.    LOWER BODY DRESSING: Lower Body Dressing Score = 4.  Patient requires minimal  assistance for lower body dressing, requiring steadying for balance only.  Patient requires the following assistive device(s): No assistive devices were  required.    TOILETING: Toileting Score = 4.  Patient requires minimal assistance for  toileting, such as steadying for balance while cleansing or adjusting clothes.  Patient requires the following assistive device(s):  Grab bar.    BLADDER MANAGEMENT - LEVEL OF ASSIST: Bladder Score = 7. Patient is completely  independent for bladder management. There are no activity limitations.    BLADDER ACCIDENTS THIS SHIFT:  0 . Patient has not had an accident this  assessment and did not require medications or devices.    BOWEL MANAGEMENT - LEVEL OF ASSIST: Bowel Score = 7. Patient is completely  independent for bowel management. There are no activity limitations.    BOWEL ACCIDENTS THIS SHIFT: 0 . Patient has not had an accident and did not  require medications or  devices. "pretty sure I went"    TRANSFERS BED/CHAIR/WHEELCHAIR: Bed/chair/wheelchair Transfer Score = 4.  Patient performs 75% or more of effort and minimal assistance (little/incidental  help/lifting of one limb/steadying) for transferring to and from the  bed/chair/wheelchair, requiring: Contact guard. Steadying.  Patient requires the following assistive device(s): Elevated bed/surface.    TRANSFER TOILET: Toilet Transfer Score = 4.  Patient performs 75% or more of  effort and minimal assistance (little/incidental help/steadying) for  transferring to and from the toilet/commode, requiring: Contact guard.  Steadying.  Patient requires the following assistive device(s):  No assistive devices were  required.    TRANSFER SHOWER: Shower Transfer Score = 4. Patient performs 75% or more of  effort and minimal assistance (little/incidental help/lifting of one  limb/steadying) for transferring to and from the shower, requiring: Contact  guard. Steadying. Shower chair.    COMPREHENSION: Auditory comprehension is the usual mode. Comprehension Score =  7, Independent.  Patient comprehends complex/abstract information in their  primary language.  Patient is completely independent for auditory comprehension.   There are no activity limitations.    EXPRESSION: Vocal expression is the usual mode. Expression Score = 7,  Independent.  Patient expresses complex/abstract information in their primary  language.  Patient is completely independent for vocal expression.  There are no  activity limitations.    SOCIAL INTERACTION: Social Interaction Score = 7, Independent. Patient is  completely independent for social interaction.  There are no activity  limitations.    PROBLEM SOLVING: Problem Solving Score = 7, Independent.  Patient makes  appropriate decisions in order to solve complex problems.  Patient is completely  independent for problem solving.  There are no activity limitations.    MEMORY: Memory Score = 7, Independent.   Patient is completely independent for  memory.  There are no activity limitations.    IRFPAI Goals    Eating: 7  Grooming: 7  Bathing; 6  Upper Body Dressing: 6  Lower Body Dressing: 6  Toileting: 6  Bed, Chair, Wheelchair Transfers: 6  Mode of Bathing: S  Tub/Shower: 6  Toilet Transfers: 6  Mode of Comprehension: A  Comprehension: 7  Mode of Expression: V  Expression: 7  Problem Solving: 7  Social Interaction: 7  Memory: 7    Short Term Goals:  Not applicable.  Long Term Goals:   Time frame to achieve long term goal(s):   7-10 days from initial eval on 03/15/14       1. Pt will be Mod I in ADL routine       2. Pt will be Mod I in functional mobility, DME PRN       3. Pt will adhere to sternal precautions 100% of the time with no cuing       4. Pt will be Independent in bed mobility from flat surface with no cues to  adhere to sternal precautions.       5. Pt will complete cooking task at stove top with S, DME PRN.    Risks/Benefits of Rehabilitation Discussed with Patient/Caregiver: Yes.  Recommendations/Goals for Rehabilitation Discussed with Patient/Caregiver: Yes.    PLAN  Occupational Therapy Plan: Occupational Therapy is recommended.  Recommended Frequency/Duration/Intensity: 60-120 minutes, 5-6x week for 7-10  days from initial eval on 03/15/14  Activities Contributing Toward Care Plan: ADL/IADL retraining, AE training,  sternal precautions, TherEx, TherAct, d/c planning    Team Care Plan  Please review Integrated Patient View Care Plan Flowsheet for Team identified  Problems, Interventions, and Goals.    Identified problems from team documentation:  Problem: Impaired Endocrine/Metabolic Function  Endocrine: Primary Team Goal: Patient will demonstrate understanding of DM  management to include adm of insulin, s/s of hypo/ hyper glycemia./    Problem: Impaired Mobility  Mobility: Primary Team Goal: Patient will demonstrate understanding of correct  movements with sternal precautions for patient to minimize  accidents./    Identified problems from this assessment:     Self Care Management : Pt will be Mod I in ADL and functional mobility and  adhere to sternal precautions 100% of the time with no cuing.    Discipline:  Occupational Therapy    3 Hour Rule Minutes: 120 minutes of OT treatment this session count towards  intensity and duration of therapy requirement. Patient was seen for the full  scheduled time of OT treatment this session.  Therapy Mode Minutes: Individual: 120 minutes.    Signed by: Deitra Mayo, OTR/L 03/15/2014 10:00:00 AM

## 2014-03-15 NOTE — Rehab Progress Note ([facility]) (Signed)
NAMEKAILAN CARMEN  MRN: [id number]  Account: [id number]  Session Start: 03/15/2014 12:00:00 AM  Session Stop: 03/15/2014 12:00:00 AM    SEVERE SEPSIS SCREEN  INFECTION:  Patient has no indication of infection.  Negative Sepsis Screen.  If you are unable to assess a system's dysfunction because you do not have labs,  or the labs you have are not current (within 24 hours), call physician and  request and order for the lab tests needed.    Signed by: Coralyn Pear, RN 03/15/2014 7:24:00 AM

## 2014-03-15 NOTE — Rehab Progress Note ([facility]) (Signed)
NAMENYLAN NAKATANI  MRN: [id number]  Account: [id number]  Session Start: 03/15/2014 12:00:00 AM  Session Stop: 03/15/2014 12:00:00 AM    SEVERE SEPSIS SCREEN  INFECTION:  Patient has no indication of infection.  Negative Sepsis Screen.  If you are unable to assess a system's dysfunction because you do not have labs,  or the labs you have are not current (within 24 hours), call physician and  request and order for the lab tests needed.    Signed by: Dolores Hoose, RN 03/15/2014 8:18:00 PM

## 2014-03-15 NOTE — Rehab Evaluation ([facility]) (Addendum)
Corrected 03/15/2014 4:29:01 PM    NAME: [name]  MRN: [id number]  Account: [id number]  Session Start: 03/15/2014 1:00:00 PM  Session Stop: 03/15/2014 2:00:00 PM    Physical Therapy  Inpatient Rehabilitation Eval    Rehab Diagnosis: Debility secondary to NSTEMI and multivessel CAD S/P CABG  Demographics:            Age: 63Y            Gender: Female  Primary Language: English  Past Medical History: Diabetes mellitus  Chronic kidney disease  Myocardial infarction  Hypertension  Hyperlipidemia  Peripheral arterial disease 06/28/13 right superficial femoral artery stenosis,  tibial peroneal trunk stenosis, left CVL a stenosis, treated with angiography  and angioplasty.  Coronary artery disease  Left circumflex artery presumed DES., Chronically occluded right coronary artery  stent  Anemia  Arterial leg ulcer 2014, receiving home health  Chronic obstructive pulmonary disease   Past Surgical History  Procedure                                                      Date  Cardiac angiography and angioplasty   06/2013  Colonoscopy                                  Unknown  Tubal ligation                                                  Unknown  History of Present Illness: patient is 31F with h/o DMII, PAD, HTN, CKD, CAD s/p  RCA stent and recent admission to OSH for NSTEMI required ICU admission, she  presented on 12/12 with syncope and elevated troponin, found to have multivessel  CAD,  then transferred transferred from [facility] 12/14 for CV Surgery  consult for CABG. She was on a heparin gtt and her ACEI was placed on hold in  setting of ARF, plavix was held given evaluation for CABG. nitro gtt was started  for hypertensive urgency which was weaned off 12/15 for H/A. Patient also with  severe PAD with chronic RLE arterial ulcer, s/p angioplasty in 06/2013,  clinically improving, but moved 2 weeks ago and has not had wound care since  that time. Wound culture unremarkable from OSH on 12/13.  Prior to hosp patient  had  not been taking her Spiriva or Symbicort for COPD, resumed in hospital. She  is also being treated for asymptomatic bacteriuria and iron deficiency anemia.  She is DMII and her Hgb A1C 7.1 on 12/12.  On 12/18 patient underwent CABG x 2  without   Date of Onset: 02/26/14   Date of Admission: 03/14/2014 6:35:00 PM  Premorbid Functional Level: Mobility:   Ambulation Device: Rolling walker Mod I for household distances, has rolling  walker and cane at home  Activities of Daily Living:  Eating: Independent. Grooming: Independent. Bathing  UB: Modified Independent. Bathing LB: Modified Independent. Dressing UB:  Independent. Dressing LB: Modified Independent. Toileting: Modified Independent.  Cognition:  WNL  Communication:  WNL  Swallowing:  WNL  Social/Education History: Marital Status: unknown  Children: 2 daughters, lives  with [name], she works nights , other daughter  lives in the area and can help with patient after D/C from rehab          Reside:  [place] in a 1 Story home with 1 STE  Employment Status:  retired  Recreational Activities/Hobbies:  Home Environment: Patient lives with daughter [name] in a 1 story home with 1  STE, will return there after rehab    Medications and Allergies: Significant rehabilitation considerations:   Please refer to [facility]  Rehabilitation Precautions/Restrictions:   Falls, Infection,    SUBJECTIVE  Patient/Caregiver Goals:  Patient's functional goals: To go home and live  normally  Pain: Patient currently without complaints of pain.  Pain Reassessment: Pain was not reassessed as no pain was reported.    OBJECTIVE  General Observations: Pt received in chair position in bed; vertical sternal  incision. Arterial wound on right foot is covered, so not inspected. Pt is  agreeable to PT at this time. No lines or leads noted.  Understanding of Current Condition: I am weak and need rehab so I can go home  and live normally  Vital Signs:                       Current Value                 Previous Value  Vitals  BP Systolic          -                            131/58  Pulse                -                            84  Respirations         -                            18  O2 Saturation        -                            97    Communication/Cognition: Pt is alert and oriented x4; poor dentation; however,  pt is able to communicate needs effectively. Pt has very good safety awareness  and no impairments in problem solving and memory.  Sensation: Not tested formally; will test during next session.  Range of Motion: PROM is within functional limits at UEs (within sternal  precautions) and at LLE. Noted limited DF ROM at RLE, causing hyperextension at  right knee during stance.  Strength/Motor Control: Pt's strength is WFLs throughout. No postural control  deficits noted noted.  Balance: Pt's seated balance is good. Standing balance is impaired, as DF  limitation causes hyperextension at right knee and center of gravity to be  behind BOS in standing.    Therapeutic/Functional Mobility:            Please see FIM Section below.    Interventions: None provided today.    Interdisciplinary Educational Needs and Learning Preferences:       Learning Preference: The patient's preferred learning method is:  Explanation.  The patient's preferred learning method is: Demonstration.       Barriers to  Learning: No barriers.       Learning Needs: Equipment, Functional activities/mobility,  Leisure/vocational, Medical management, Pain management, Plan of care,  Precautions, Rehabilitation techniques and procedures, Safety, Skin/wound care,  Stroke    Education Provided: Plan of care. Skin/wound care. Prevention of skin breakdown.  Gait. Equipment.       Audience: Patient.       Mode: Explanation.       Response: Verbalized understanding.  Demonstrated skill.  Needs reinforcement.    ASSESSMENT  Summary of Deficits and Prognosis: Mrs. Bartok presents to PT with sternal  precautions, RLE wound, cardiovascular endurance  deficits, balance impairment,  and limited activity tolerance that limits her ability to perform transfers,  ambulation, stair negotiation, and self care. Consequently, pt is unable to  resume independent community dwelling and mobility, and leisure activity. Pt  will benefit from 2 weeks of inpatient PT services in order to maximize safety  and independence with functional mobility, to reduce fall risk and to prepare  for safe d/c home with limited family assistance.  Rehab Potential: Able to participate in an intensive inpatient interdisciplinary  rehabilitation program, Living in the community premorbidly, Motivated  Barriers to Progress/Discharge: No potential barriers to progress.    Functional Measures      TRANSFERS BED/CHAIR/WHEELCHAIR: Bed/chair/wheelchair Transfer Score = 4.  Patient performs 75% or more of effort and minimal assistance (little/incidental  help/lifting of one limb/steadying) for transferring to and from the  bed/chair/wheelchair, requiring: Steadying.  Patient requires the following assistive device(s): No assistive devices were  required. Pt pt performed supine to and from sit, and bilateral rolling with  supervision and VCs for log roll technique. Pt transfers with CGA for steadying,  without AD and with use of RW.    LOCOMOTION WHEELCHAIR:   Wheelchair locomotion was observed using a manual wheelchair. Wheelchair  Distance Scale = 2.  Distance traveled in wheelchair is 50 -149 feet. Wheelchair  Score = 2.  Patient is able to go at least 50 feet (household distance) in  wheelchair but requires assistance. Patient was able to propel a distance of  100 feet in a wheelchair.  No assistive devices required. Pt uses LEs for  propelling w/c over even tiled and carpeted surface, in order to maintain  sternal precautions.    LOCOMOTION WALK:   Walk Distance Scale = 1.  Distance walked is less than 50 feet. Walk Score = 1.   Incidental assistance with lifting, contact guard or steadying was  provided.  Patient performs 75% or more of effort and requires minimal contact assistance  for walking. Patient walked a distance of 40 feet. Rolling walker. Pt ambulates  over even surface using RW with tactile cues for maintaining sternal precautions  on RW. Farther ambulation limited due to c/o fatigue.    LOCOMOTION STAIRS: Stairs did not occur because activity was unsafe for patient.  pt unsafe due to sternal precautions at this time.    COMPREHENSION: Auditory comprehension is the usual mode. Comprehension Score =  7, Independent.  Patient comprehends complex/abstract information in their  primary language.  Patient is completely independent for auditory comprehension.   There are no activity limitations.    EXPRESSION: Vocal expression is the usual mode. Expression Score = 7,  Independent.  Patient expresses complex/abstract information in their primary  language.  Patient is completely independent for vocal expression.  There are no  activity limitations.    SOCIAL INTERACTION: Social Interaction Score = 7, Independent. Patient is  completely independent for social interaction.  There are no activity  limitations.    PROBLEM SOLVING: Problem Solving Score = 7, Independent.  Patient makes  appropriate decisions in order to solve complex problems.  Patient is completely  independent for problem solving.  There are no activity limitations.    MEMORY: Memory Score = 7, Independent.  Patient is completely independent for  memory.  There are no activity limitations.    PAI Expected Mode of Locomotion at Discharge: The expected mode of most  frequently used locomotion, at discharge, is expected to be walking.    IRFPAI Goals    Bed, Chair, Wheelchair Transfers: 6  Mode of Locomotion: W  Walk/Wheelchair: 5  Stairs: 1    Short Term Goals:  Time frame to achieve short term goal(s): 1 week from initial  eval on 03/15/14       1. Pt will correctly identify sternal precautions with 100% accuracy,  without cuing.       2.  Pt will ambulate 50 feet with supervision.       3. Pt will identify caregiver in order to ensure safe d/c home.  Long Term Goals:   Time frame to achieve long term goal(s): 2 weeks after  initial eval on 03/15/14       1. The pt will perform all bed mobility mod I, from flat surface and  without use of bedrails in order to ensure safe use of standard bed at home.       2. Pt will transfer bed to and from chair mod I using LRAD in order to  ensure safe transfers at home.       3. Pt will ambulate at least 50 feet mod I using LRAD, in order to ensure  unrestricted access to home environment.       4.  Pt will negotiate 1 standard stair using LRAD in order to ensure safe  entry and exiting her home environment.       5. Pt will perform car transfer with supervision in order to ensure safe  transport home.       6. Caregiver will demonstrate safe guarding and assistance techniques in  order to ensure safe d/c home.    Risks/Benefits of Rehabilitation Discussed with Patient/Caregiver: Yes.  Recommendations/Goals for Rehabilitation Discussed with Patient/Caregiver: Yes.    PLAN  Physical Therapy Plan: Physical Therapy is recommended.  Recommended Frequency/Duration/Intensity: 1-2 hours per day, 5-6 days per week  for 2 weeks after initial eval on 03/15/14  Activities Contributing Toward Care Plan: bed mobility, transfer training,  aerobic and endurance training, balance activity, strengthening, procurement of  DME, caregiver training, pt education and training on falls prevention, etc.    Team Care Plan  Please review Integrated Patient View Care Plan Flowsheet for Team identified  Problems, Interventions, and Goals    Identified problems from team documentation:  Problem: Impaired Endocrine/Metabolic Function  Endocrine: Primary Team Goal: Patient will demonstrate understanding of DM  management to include adm of insulin, s/s of hypo/ hyper glycemia./    Problem: Impaired Mobility  Mobility: Primary Team Goal: Patient  will demonstrate understanding of correct  movements with sternal precautions for patient to minimize accidents./    Identified problems from this assessment:     Mobility : Patient will perform household functional mobility mod I using LRAD  in order to ensure safe d/c home.    Discipline:  Physical Therapy    3 Hour Rule Minutes: 60 minutes  of PT treatment this session count towards  intensity and duration of therapy requirement. Patient was seen for the full  scheduled time of PT treatment this session.  Therapy Mode Minutes: Individual: 60 minutes.    Signed by: Arnoldo Hooker, PT  03/15/2014 2:00:00 PM

## 2014-03-16 LAB — PTH, INTACT: PTH Intact: 223.2 pg/mL — ABNORMAL HIGH (ref 9.0–72.0)

## 2014-03-16 LAB — GLUCOSE WHOLE BLOOD - POCT
Whole Blood Glucose POCT: 90 mg/dL (ref 70–100)
Whole Blood Glucose POCT: 146 mg/dL — ABNORMAL HIGH (ref 70–100)
Whole Blood Glucose POCT: 123 mg/dL — ABNORMAL HIGH (ref 70–100)
Whole Blood Glucose POCT: 151 mg/dL — ABNORMAL HIGH (ref 70–100)

## 2014-03-16 NOTE — Rehab Progress Note ([facility]) (Signed)
NAMEKLEO DUNGEE  MRN: [id number]  Account: [id number]  Session Start: 03/16/2014 9:50:00 AM  Session Stop: 03/16/2014 10:30:00 AM    Therapeutic Recreation  Inpatient Rehabilitation Progress Note    Rehab Diagnosis: Debility secondary to NSTEMI and multivessel CAD S/P CABG  Demographics:            Age: 54Y            Gender: Female  Rehabilitation Precautions/Restrictions:   Falls, Infection,    SUBJECTIVE  Patient Reports: "I just moved" pt reports limited time for leisure  participation in home setting  Patient/Caregiver Goals:  To go home and live normally  Pain: Patient currently without complaints of pain.    OBJECTIVE    Interventions: The following treatment was provided:  Endurance/Activity Tolerance:  painted ceramic craft  Pain Reassessment: Pain was not reassessed as no pain was reported.    Education Provided:  No education provided this session.    ASSESSMENT  Pt received from physical therapist, seated in wheelchair. Pt agreeable to TR  tx, elected to engage in painting a Producer, television/film/video. Pt mod I for table top  leisure task once set up.  Progress Towards Goals:    PLAN  Therapeutic Recreation services are recommended to address: activity tolerance,  leisure particiation    Care Plan  Identified problems from team documentation:  Problem: Impaired Endocrine/Metabolic Function  Endocrine: Primary Team Goal: Patient will demonstrate understanding of DM  management to include adm of insulin, s/s of hypo/ hyper glycemia./Active    Problem: Impaired Mobility  Mobility: Primary Team Goal: Patient will perform household functional mobility  mod I using LRAD in order to ensure safe d/c home./Active    Problem: Impaired Self-care Mgmt/ADL/IADL  Self Care: Primary Team Goal: Pt will be Mod I in ADL and functional mobility  and adhere to sternal precautions 100% of the time with no cuing./Active    Problem: Impaired Skin/Wound Mgmt  Skin Wound: Primary Team Goal: Patient and caregiver will be independent of  wound  care/Active    Add/Update Problems from this Treatment:  No updates at this time.    Please review Integrated Patient View Care Plan Flowsheet for Team identified  Problems, Interventions, and Goals.    Signed by: Shanon Payor, CTRS 03/16/2014 12:45:00 PM

## 2014-03-16 NOTE — Progress Notes (Signed)
PHYSICAL MEDICINE AND REHABILITATION  PROGRESS NOTE    Date Time: 03/16/2014 5:58 PM  Patient Name: [name], [name]    Admission date:  03/14/2014      Subjective:     The patient reports sternal pain.  She took her pain medication.  She does not want a higher dose of the pain medication.  She is tolerating therapies.  No new issues with sleeping, bowel or bladder.  Functional Status:     PT:  Therapeutic Activities: Pt seen to work on transfers, attempting walking  and stretching right ankle. Pt needed min assist to transfer with RW putting  very little weight on her hands, but also difficult for pt to put weight on  right foot Due to pain and tenderness. Pt worked with Tax inspector under  her right foot to increase ROM into DF. Pt practiced sit to supine on wedge  maintaining sternal precautions with min assist. Pt needed min assist to also  get back up to sititng position from reclined on the wedge. Pt performed green  theraball exercises flexion/extention and side to side mvmts. Bridges and [employer].  Pt attemted to walk , but her right foot was too painful.    OT:  Therapeutic Activities: OT treatment addressed safety and adherence to  sternal precautions during gross motor transitions and functional mobility.  Performed right ankle sliding stretches seated in wheelchair in preparation for  standing. Practiced repeated sit to stands from EOM from graded heights with  emphasis on right LE weightbearing and sternal precautions. Engaged in repeated  ambulatory transfers x3-58ft with RW with supervision. OT placed hands underneath  pt's on walker to ensure adherence to sternal precautions and provided prompting  to weight-bear through right LE rather than hopping using UE. Practiced  wheelchair propulsion x13ft using feet to improve tolerance for right LE  weightbearing/ROM and to increase overall conditioning. Pt left seated in  wheelchair, all needs within reach, hand off to tech.    Medications:   Medication  reviewed by me:     Scheduled Meds: PRN Meds:        amiodarone 200 mg Oral Daily   amLODIPine 10 mg Oral Daily   aspirin EC 325 mg Oral Daily   atorvastatin 10 mg Oral QHS   cadexomer iodine  Topical Daily   carvedilol 25 mg Oral Q12H SCH   furosemide 20 mg Oral Daily   gabapentin 300 mg Oral BID   heparin (porcine) 5,000 Units Subcutaneous Q12H SCH   hydrALAZINE 75 mg Oral Q12H   insulin aspart 3 Units Subcutaneous Once   lidocaine 2 patch Transdermal Q24H   polyethylene glycol 17 g Oral Daily   sodium bicarbonate 650 mg Oral TID   venlafaxine 75 mg Oral BID       Continuous Infusions:      acetaminophen 650 mg Q6H PRN   albuterol-ipratropium 3 mL Q2H PRN   bisacodyl 10 mg QD PRN   dextrose 25 mL PRN   glucagon (rDNA) 1 mg PRN   insulin aspart 1-8 Units PRN   magnesium hydroxide 10 mL Q6H PRN   oxyCODONE-acetaminophen 1 tablet Q4H PRN   traMADol 50 mg Q6H PRN           Review of Systems:   A comprehensive review of systems was: No fevers, chills, nausea, vomiting, chest pain, shortness of breath, cough, headache, double vision.  All others negative.    Physical Exam:     Filed Vitals:  03/15/14 0810 03/15/14 0811 03/15/14 1950 03/16/14 0545   BP: 144/71 131/58 138/61 145/63   Pulse: 82  75 80   Temp:   97.9 F (36.6 C) 98.4 F (36.9 C)   TempSrc:       Resp: 16  18 18    SpO2: 97%  94% 92%       Intake and Output Summary (Last 24 hours) at Date Time  No intake or output data in the 24 hours ending 03/16/14 1758                    Cardiac: regular rhythm  Chest / Lungs:  Clear to auscultation.  Abdomen:  + bowel sounds, Soft, non-tender, non-distended.  Extremities: no calf tenderness.  Right dorsal foot with dressing and Ace.    Labs:   No results for input(s): GLUCOSEWHOLE in the last 24 hours.      Recent Labs  Lab 03/15/14  0522   WBC 5.28   HEMOGLOBIN 9.0*   HEMATOCRIT 31.0*   PLATELETS 275          Recent Labs  Lab 03/15/14  0522 03/14/14  0513 03/13/14  0353 03/12/14  0341   SODIUM 141 141 141 140    POTASSIUM 5.3* 4.9 5.1 5.0   CHLORIDE 109 109 110 110   CO2 26 26 27 25    BUN 27* 28.0* 28.0* 28.0*   CREATININE 1.7* 1.6* 1.7* 1.5*   CALCIUM 8.5 8.4* 8.0* 8.2*   ALBUMIN 2.8*  --   --   --    PROTEIN, TOTAL 5.8*  --   --   --    BILIRUBIN, TOTAL 0.2  --   --   --    ALKALINE PHOSPHATASE 73  --   --   --    ALT 12  --   --   --    AST (SGOT) 13  --   --   --    GLUCOSE 113* 103* 108* 104*       No results for input(s): INR, APTT in the last 168 hours.    Results     Procedure Component Value Units Date/Time    Glucose Whole Blood - POCT [[id number]]  (Abnormal) Collected:  03/16/14 1632     POCT - Glucose Whole blood 123 (H) mg/dL Updated:  08/65/78 4696    Urine culture [[id number]] Collected:  03/15/14 0549    Specimen Information:  Urine / Urine, Clean Catch Updated:  03/16/14 1216    Narrative:      ORDER#: [id number]                                    ORDERED BY: Alea Ryer HUN  SOURCE: Urine, Clean Catch                           COLLECTED:  03/15/14 05:49  ANTIBIOTICS AT COLL.:                                RECEIVED :  03/15/14 12:21  Culture Urine                              FINAL       03/16/14 12:16  03/16/14  30,000 - 50,000 CFU/ML of multiple bacterial morphotypes present.             Possible contamination, appropriate recollection is             requested if clinically indicated.      Glucose Whole Blood - POCT [[id number]]  (Abnormal) Collected:  03/16/14 1137     POCT - Glucose Whole blood 146 (H) mg/dL Updated:  11/91/47 8295    PTH, Intact [[id number]]  (Abnormal) Collected:  03/16/14 0519    Specimen Information:  Blood Updated:  03/16/14 0839     PTH Intact 223.2 (H) pg/mL     Glucose Whole Blood - POCT [[id number]] Collected:  03/16/14 0548     POCT - Glucose Whole blood 90 mg/dL Updated:  84/13/24 4010    MRSA culture [[id number]] Collected:  03/14/14 2301    Specimen Information:  Body Fluid / Nasal/Throat ASC Admission Updated:  03/16/14 0502    Narrative:      ORDER#: [id number]                                     ORDERED BY: Melonee Gerstel HUN  SOURCE: Nares and Throat                             COLLECTED:  03/14/14 23:01  ANTIBIOTICS AT COLL.:                                RECEIVED :  03/15/14 04:22  Culture MRSA Surveillance                  FINAL       03/16/14 05:02  03/16/14   Negative for Methicillin Resistant Staph aureus from Nares and             Negative for Methicillin Resistant Staph aureus from Throat      Glucose Whole Blood - POCT [[id number]]  (Abnormal) Collected:  03/15/14 2139     POCT - Glucose Whole blood 130 (H) mg/dL Updated:  29/51/88 4166               Rads:   Radiological Procedure reviewed.  Radiology Results (24 Hour)     ** No results found for the last 24 hours. **              Assessment and Plan:     Ambulation and activities of daily living dysfunction due to cardiac debility  Status post MI  Status post coronary artery bypass graft with multivessel coronary artery disease  - Follow progress towards goals with PT and OT.  - Estimated length of stay is about 2 weeks.  - Continue with current pain medications and Lidoderm patches for pain control.  Blood loss anemia  - Follow trend.  Followup periodically.  History of anemia of chronic disease  Chronic kidney disease III  - Appreciated nephrology input.  Check labs.  - Reduced the gabapentin dosages.  History of moderate aortic stenosis  History of LVEF 45%  Severe peripheral arterial disease  Right lower limb chronic arterial ulcer  - Continue with local care.  Appreciated wound care nurse efforts.  Continue with [facility].    History of COPD    Patient Active Problem List   Diagnosis   .  NSTEMI (non-ST elevated myocardial infarction)   . Hypertensive urgency   . PAD (peripheral artery disease)   . Arterial leg ulcer   . Chronic obstructive pulmonary disease, unspecified COPD type   . Ischemic cardiomyopathy   . CAD (coronary artery disease)   . Diabetes   . Chronic kidney disease   . Debilitated       Continue comprehensive and  intensive inpatient rehab program, including:   Physical therapy 60-120 min daily, 5-6 times per week, Occupational therapy 60-120 min daily, 5-6 times per week, Case management and Rehabilitation nursing    Signed by: [name] MD    [facility]

## 2014-03-16 NOTE — Rehab Progress Note ([facility]) (Addendum)
Corrected 03/16/2014 1:27:21 PM    NAME: [name]  MRN: [id number]  Account: [id number]  Session Start: 03/16/2014 12:00:00 AM  Session Stop: 03/16/2014 12:00:00 AM    Rehabilitation Nursing  Inpatient Rehabilitation Shift Assessment    Rehab Diagnosis: Debility secondary to NSTEMI and multivessel CAD S/P CABG  Demographics:            Age: 63Y            Gender: Female  Primary Language: English    Date of Onset:  02/26/14  Date of Admission: 03/14/2014 6:35:00 PM    Rehabilitation Precautions Restrictions:   Falls, Infection,    Patient Report: "Can I have pain medication"  Pain: Patient currently has pain.  Location: Generalized  Type: Acute  Quality: Aching.  Pain Scale: Numeric.  Patient reports a pain level of 5 out of 10.  Patient's acceptable level of pain 3 out of 10.   Interferes with physical activity.  Pain is alleviated by: Medication  Pain is exacerbated by: Movement Patient medicated. Patient medicated.  Pain Reassessment:  Response to Pain Intervention: Med was helpfull  Post Intervention Pain Quality:  Aching.  Patient Reports Post Intervention Pain Level of: 2 out of 10  Pain Acceptable: Yes  Patient/Caregiver Goals:  To go home and live normally    NEURO  Orientation/Awareness: Alert and Oriented x3.  Speech: No deficits noted at this time.  Behavior: Cooperative.    MEDICATIONS  IV Access: No IV access.  Dialysis Access: Patient does not have dialysis access.      Elopement Risk Level Assessment Tool  Patient Criteria: Patient is not capable of leaving the unit.  Assessment is not  applicable.      RISK ASSESSMENT FOR FALLS/INJURY    MENTAL STATUS CRITERIA:   0 - None identified.  MENTAL STATUS TOTAL: 0    AGE CRITERIA:   60 - < 61 years old  AGE TOTAL: 0    ELIMINATION CRITERIA:   3 - Toileting with Assistance.   ELIMINATION TOTAL: 3    HISTORY OF FALLS CRITERIA:   2 - Unknown History.  HISTORY OF FALLS TOTAL: 2    MEDICATIONS CRITERIA:   2 or more High Risk Medications (*see list below)    MEDICATIONS TOTAL: 2    PHYSICAL MOBILITY CRITERIA:   3 - Decreased balance reaction.   1 - Weakness/impaired physical mobility.   PHYSICAL MOBILITY TOTAL: 4    FALLS RISK ASSESSMENT TOTAL: 11    Patient's Fall Risk: TOTAL SCORE >10: High Risk    Falls Interventions: Clutter removed and clear path to BR.  Call bell, phone, glasses, etc within reach.  Hourly toileting/safety checks between 6am and 10pm, then every 2 hours.  Initiate Fall care plan and outcome.  Yellow "high risk" patient identification in place: wrist band, socks, chart  sticker, door sign.  Pt and family education.  Assistive devices at [facility].    NUTRITION  Diet:  Type: Consistent carbohydrate.  Food Consistency: Regular.  Liquid Consistency: Thin.      CARDIOVASCULAR     Bilateral lower extremities  Nail Bed Color: Pink.   +1 edema to anterior shins  Pulses:   Apical Pulse: Regular. Strong. Rate is 80 .   Patient does not have a pacemaker.   Patient does not have a defibrillator.    CARDIOPULMONARY  Lung Sounds:   Upper lobes. Clear.   Lower lobes. Clear.  Type of Respirations: Regular.  Cough: No cough  noted.  Respiratory Support: The patient does not require any respiratory support.  Respiratory Equipment: None. 92% RA    INTEGUMENTARY  Skin:  Temperature: Warm  Turgor: Normal for age  Moisture: Dry  Color of skin: Normal for Race/Ethnicity  Capillary Refill: Less than 3 seconds  Wounds/Incisions:     Surgical Incision: Sternal incision Length: 17 centimeter(s) with glue. No  signs of infection.  Drainage: Incision without drainage.  Odor:  No  Incision Care: Per protocol.     Surgical Incision: Sternal incision Length: 1 centimeter(s) with glue. No signs  of infection.  Drainage: Incision without drainage.  Odor:  No  Incision Care: Per protocol.     Surgical Incision: Sternal incision Length: 1 centimeter(s) 3 incision sites No  signs of infection.  Drainage: Incision without drainage.  Odor:  No  Incision Care: Per protocol.     Open wound. Right  interior ankle extending to foot Tissue Type: Granulation  Tissue. Tissue Type: Epithelial Tissue. Tissue Type: Slough-  Length: 15 centimeters    Width: 4 centimeters.    Depth: No, secondary to  necrotic tissue.  Open Surface Area:  60 cm  Undermining: No  Tunneling:  No  Peri-Wound Erythema: No  Skin Color:  Pink or normal for ethnic group  Open Wound Care: Rinzed with NS, place lodoform ointment, cover with non  adhesive telfa, wrap in cotton stretch gauze the ace wrap  Braden Scale for Predicting Pressure Sore Risk: Sensory Perception: No  impairment  Moisture: Rarely moist  Activity: Walks occasionally  Mobility: Slightly limited  Nutrition: Adequate  Friction and Shear: No apparent problem  Braden Score: 20  Level of Risk: No risk (19-23). Will reassess every shift.    GASTROINTESTINAL  Abdomen: Soft. Nontender.  Bowel Sounds:  Active bowel sounds audible in all four quadrants.  Date of Last Bowel Movement:  03/15/14   No Problems/Complaints with Bowel Elimination Assessed.    GENITOURINARY  Current Bladder Pattern: Continent  Color:  Yellow   Patient denies problems with urination and/or catheter.    MUSCULOSKELETAL  Upper Body: Sternal Precautions in place  Lower Body:    Functional Measures    EATING: Eating Score = 7. Patient is completely independent for eating.  There  are no activity limitations.    GROOMING: Grooming Score = 5. Patient is supervision/set-up for grooming,  requiring: Stand by assistance.  Setting out grooming equipment.  Patient requires the following assistive device(s) No assistive devices were  required.    BLADDER MANAGEMENT - LEVEL OF ASSIST: Bladder Score = 5.  Patient is  supervision/set-up for bladder management, requiring: Setting out equipment.  Emptying equipment.  Patient requires the following assistive device(s): Bedside Commode.    BLADDER ACCIDENTS THIS SHIFT:  0 . Patient has not had an accident but used a  bedside commode this assessment.    BOWEL MANAGEMENT - LEVEL  OF ASSIST: Bowel Score = 7.  Patient is completely  independent for bowel management.  Patient did not have bowel movement. No  medication/intervention was provided.    BOWEL ACCIDENTS THIS SHIFT: 0 . Patient has not had an accident and did not  require medications or devices.    Education Provided:    Education Provided: Precautions. Pain management. Pain scale. Medication  options. Side effects. Safety issues and interventions. Fall protocol.  Skin/wound care. Signs/symptoms of infection. Medication. Name and dosage.  Administration. Purpose. Side Effects.       Audience: Patient.  Mode: Explanation.       Response: Verbalized understanding.    Discharge: Patient is not being discharged at this time.    Long Term Goals:  1. Patient will be able to demonstrate skill in wound care,  dressing changes and skin monitoring with out queing.   2. Patient will perform wound dressing changes independently.   3. Patient will demonstrate safe transfers from bed to wheelchair, bed to chair  and and bed to bsc using sternal precautions.   4. Patient will communicate her understandfing of s/s of  wound infection and  when to call the doctor.   5. Patient will demonstrate her understanding of DM and how to monitor BS, adm  insulin, and s/s of hypo and hyperglycemia.  Jan 11th (2 weeks)  Short Term Goals:  1. Patient will be able to call 100 percent of the time for  help when getting out of bed. She will communicate her understanding for  assistance. - Goal Not Met   2. Patient will be able to, at the end of the week demonstrate good hand  washing techniques, and wound care for lower right extremity with reach bar if  needed.  January 4th, one week    PROGRESS TOWARD GOALS: Goals in progress    PLAN: Nursing Specific Interventions  Pain Management. Skin Management. Wound Management.  Continue with the current Nursing Plan of Care.    TEAM CARE PLAN  Identified problems from team documentation:  Problem: Impaired  Endocrine/Metabolic Function  Endocrine: Primary Team Goal: Patient will demonstrate understanding of DM  management to include adm of insulin, s/s of hypo/ hyper glycemia./Active    Problem: Impaired Mobility  Mobility: Primary Team Goal: Patient will perform household functional mobility  mod I using LRAD in order to ensure safe d/c home./Active    Problem: Impaired Self-care Mgmt/ADL/IADL  Self Care: Primary Team Goal: Pt will be Mod I in ADL and functional mobility  and adhere to sternal precautions 100% of the time with no cuing./Active    Problem: Impaired Skin/Wound Mgmt  Skin Wound: Primary Team Goal: Patient and caregiver will be independent of  wound care/Active    Add/Update Problems from this assessment:  No updates at this time.    Please review Integrated Patient View Care Plan Flowsheet for Team identified  Problems, Interventions, and Goals.    Signed by: Oscar La, RN 03/16/2014 1:25:00 PM

## 2014-03-16 NOTE — Rehab Progress Note ([facility]) (Signed)
[name]  MRN: [id number]  Account: [id number]  Session Start: 03/16/2014 3:00:00 PM  Session Stop: 03/16/2014 4:00:00 PM    Physical Therapy  Inpatient Rehabilitation Progress Note - Brief    Rehab Diagnosis: Debility secondary to NSTEMI and multivessel CAD S/P CABG  Demographics:            Age: 48Y            Gender: Female  Rehabilitation Precautions/Restrictions:   Falls, Infection,    SUBJECTIVE  Patient Report: " I am pretty tired"  Pain: Patient currently has pain.  Patient reports a pain level of 5 out of 10. Patient medicated. at the end of  the session    OBJECTIVE  General Observations: Pt received in w/c; vertical sternal incision. Arterial  wound on right foot is covered, so not inspected. Pt is agreeable to PT at this  time. No lines or leades noted.  Vital Signs:                       Current Value                Previous Value  Vitals  BP Systolic          151                          145/63  BP Diastolic         63                           -  Pulse                72                           80  Respirations         -                            18    Interventions:       Therapeutic Activities:  Pt seen to work on transfers, attempting walking  and stretching right ankle.   Pt needed min assist to transfer with RW putting  very little weight on her hands, but also difficult for pt to put weight on  right foot Due to pain and tenderness.  Pt worked with Tax inspector  under  her right foot to increase ROM into DF.  Pt practiced sit to supine on wedge  maintaining sternal precautions with min assist.  Pt needed min assist to also  get back up to sititng position from reclined on the wedge.   Pt performed green  theraball exercises  flexion/extention and side to side mvmts.  [employer] and [employer].   Pt attemted to walk , but her right foot was too painful.  RN and primary PT  made aware and pain meds  given at the end of the session.    ASSESSMENT  Pts right foot was too painful to proceed with  walking this session    PLAN  Continued Physical Therapy is recommended.  Recommended Frequency/Duration/Intensity: 1-2 hours per day, 5-6 days per week  for 2 weeks after initial eval on 03/15/14  Activities Contributing Toward Care Plan: bed mobility, transfer training,  aerobic and endurance training, balance activity, strengthening, procurement of  DME, caregiver training, pt education and training on falls prevention, etc.    3 Hour Rule Minutes: 60 minutes of PT treatment this session count towards  intensity and duration of therapy requirement. Patient was seen for the full  scheduled time of PT treatment this session.  Therapy Mode Minutes: Individual: 60 minutes.    Signed by: Roseanna Rainbow, LPTA 03/16/2014 4:00:00 PM

## 2014-03-16 NOTE — Rehab Progress Note ([facility]) (Signed)
[name]  MRN: [id number]  Account: [id number]  Session Start: 03/16/2014 1:00:00 PM  Session Stop: 03/16/2014 2:00:00 PM    Occupational Therapy  Inpatient Rehabilitation Progress Note - Brief    Rehab Diagnosis: Debility secondary to NSTEMI and multivessel CAD S/P CABG  Demographics:            Age: 63Y            Gender: Female  Rehabilitation Precautions/Restrictions:   Falls, Infection,    SUBJECTIVE  Pain: Patient currently has pain.  Patient reports a pain level of 7 out of 10. premedicated for therapy, frequent  rest breaks    OBJECTIVE    Interventions:       Therapeutic Activities:  OT treatment addressed safety and adherence to  sternal precautions during gross motor transitions and functional mobility.  Performed right ankle sliding stretches seated in wheelchair in preparation for  standing. Practiced repeated sit to stands from EOM from graded heights with  emphasis on right LE weightbearing and sternal precautions. Engaged in repeated  ambulatory transfers x3-22ft with RW with supervision. OT placed hands underneath  pt's on walker to ensure adherence to sternal precautions and provided prompting  to weight-bear through right LE rather than hopping using UE. Practiced  wheelchair propulsion x156ft using feet to improve tolerance for right LE  weightbearing/ROM and to increase overall conditioning. Pt left seated in  wheelchair, all needs within reach, hand off to tech.  Pain Reassessment: Pt continues to report 7/10 pain to sternum and right  foot/ankle at rest following session. Pt repositioned for comfort, reports her  pain is "tolerable."    Education Provided:    Education Provided: Plan of care. Functional transfers. Precautions.       Audience: Patient.       Mode: Explanation.  Demonstration.       Response: Needs reinforcement.    ASSESSMENT  Pt experiences difficulty adhering to sternal precautions during mobility due to  pain with right LE weight-bearing. Pt attempts to "hop" with RW  and requires  cues to weight-bear bilaterally and prevent UE weightbearing on RW. Continue OT  POC.    PLAN  Continued Occupational Therapy is recommended.  Recommended Frequency/Duration/Intensity: 60-120 minutes, 5-6x week for 7-10  days from initial eval on 03/15/14  Continued Activities Contributing Toward Care Plan: ADL/IADL retraining, AE  training, sternal precautions, TherEx, TherAct, d/c planning    3 Hour Rule Minutes: 60 minutes of OT treatment this session count towards  intensity and duration of therapy requirement. Patient was seen for the full  scheduled time of OT treatment this session.  Therapy Mode Minutes: Individual: 60 minutes.    Signed by: Fransisco Beau, OTR/L 03/16/2014 2:00:00 PM

## 2014-03-16 NOTE — Progress Notes (Signed)
[facility], [employer].  Progress Note    Assessment:  -HTN with CKD   -Anemia with CKD  -CKD III, baseline Cr 1.6-1.7 mg/dL  -3 vessels CAD; s/p cath and CABG   -PVD with right foot ulcer  -metabolic acidosis    Plan:  -Renal function at baseline.   -Labs in am.   -May be able to decrease bicarb tabs, based on labs    Madison Russian, MD  Office - [phone]  [employer] [phone]  ++++++++++++++++++++++++++++++++++++++++++++++++++++++++++++++  Subjective:  No new complaints    Medications:  Scheduled Meds:  Current Facility-Administered Medications   Medication Dose Route Frequency   . amiodarone  200 mg Oral Daily   . amLODIPine  10 mg Oral Daily   . aspirin EC  325 mg Oral Daily   . atorvastatin  10 mg Oral QHS   . cadexomer iodine   Topical Daily   . carvedilol  25 mg Oral Q12H [facility]   . furosemide  20 mg Oral Daily   . gabapentin  300 mg Oral BID   . heparin (porcine)  5,000 Units Subcutaneous Q12H [facility]   . hydrALAZINE  75 mg Oral Q12H   . insulin aspart  3 Units Subcutaneous Once   . lidocaine  2 patch Transdermal Q24H   . polyethylene glycol  17 g Oral Daily   . sodium bicarbonate  650 mg Oral TID   . venlafaxine  75 mg Oral BID     Continuous Infusions:   PRN Meds:acetaminophen, albuterol-ipratropium, bisacodyl, dextrose, glucagon (rDNA), insulin aspart, magnesium hydroxide, oxyCODONE-acetaminophen, traMADol    Objective:  Vital signs in last 24 hours:  Temp:  [97.9 F (36.6 C)-98.4 F (36.9 C)] 98.4 F (36.9 C)  Heart Rate:  [75-80] 80  Resp Rate:  [18] 18  BP: (138-145)/(61-63) 145/63 mmHg  Intake/Output last 24 hours:  No intake or output data in the 24 hours ending 03/16/14 1747  Intake/Output this shift:       Physical Exam:   Gen: WD WN NAD   CV: S1 S2 N RRR   Chest: CTAB   Ab: ND NT soft no HSM +BS   Ext: No C/E    Labs:    Recent Labs  Lab 03/15/14  0522 03/14/14  0513 03/13/14  0353   GLUCOSE 113* 103* 108*   BUN 27* 28.0* 28.0*   CREATININE 1.7* 1.6* 1.7*   CALCIUM 8.5  8.4* 8.0*   SODIUM 141 141 141   POTASSIUM 5.3* 4.9 5.1   CHLORIDE 109 109 110   CO2 26 26 27    ALBUMIN 2.8*  --   --        Recent Labs  Lab 03/15/14  0522   WBC 5.28   HEMOGLOBIN 9.0*   HEMATOCRIT 31.0*   MCV 89.9   MCH, POC 26.1*   MCHC 29.0*   RDW 22*   MPV 9.5   PLATELETS 275

## 2014-03-16 NOTE — Rehab Progress Note ([facility]) (Signed)
[name]  MRN: [id number]  Account: [id number]  Session Start: 03/16/2014 12:00:00 AM  Session Stop: 03/16/2014 12:00:00 AM    SEVERE SEPSIS SCREEN  INFECTION:  Patient has no indication of infection.  Negative Sepsis Screen.  If you are unable to assess a system's dysfunction because you do not have labs,  or the labs you have are not current (within 24 hours), call physician and  request and order for the lab tests needed.    Signed by: Candida Peeling, RN 03/16/2014 9:00:00 PM

## 2014-03-16 NOTE — Rehab Progress Note ([facility]) (Signed)
[name]  MRN: [id number]  Account: [id number]  Session Start: 03/16/2014 12:00:00 AM  Session Stop: 03/16/2014 12:00:00 AM    SEVERE SEPSIS SCREEN  INFECTION:  Patient has no indication of infection.  Negative Sepsis Screen.  If you are unable to assess a system's dysfunction because you do not have labs,  or the labs you have are not current (within 24 hours), call physician and  request and order for the lab tests needed.    Signed by: Oscar La, RN 03/16/2014 8:00:00 AM

## 2014-03-16 NOTE — Progress Notes (Signed)
[name]  MRN: [id number]  Account: [id number]  Session Start: 03/16/2014 12:00:00 AM  Session Stop: 03/16/2014 12:00:00 AM    Physical Medicine and Rehabilitation  Initial Individualized Interdisciplinary Plan Of Care    Rehab Diagnosis: Debility secondary to NSTEMI and multivessel CAD S/P CABG  Demographics:            Age: 76Y            Gender: Female    Plan Of Care  Team Members Attending : Dr. Selena Batten M.D., Jena Gauss CM, [facility] RN, Arnoldo Hooker PT, Fransisco Beau OT  Anticipated Discharge Date/Estimated Length of Stay: 2 weeks  Anticipated Discharge Destination: Community discharge with assistance  Medical Necessity Expected Level Rationale: Her pain control will be optimized.  Intensity and Duration: an average of 3 hours/5 days per week  Medical Supervision and 24 Hour Rehab Nursing: x  Physical Therapy: x  PT Intensity/Duration: 60-120 minutes per day for 5-6 days per week  Occupational Therapy: x  OT Intensity/Duration: 60-120 minutes per day for 5-6 days per week  Therapeutic Recreation: x  Psychology: x  Registered Dietician: x  Other: Evals In Progress    The following is a list of patient problems that have been identified by the  interdisciplinary team:    Problem: Impaired Endocrine/Metabolic Function  Endocrine Status Update: teaching in progress  Team Identified Barrier to Discharge: Yes  Other Endocrine Intervention 1 - Text: education  Other Endocrine Intervention 1 - Status: Active  Endocrine: Primary Team Goal/Status: Patient will demonstrate understanding of  DM management to include adm of insulin, s/s of hypo/ hyper glycemia. / Active    Problem: Impaired Mobility  Mobility Status Update: CGA for functional mobility with or without RW.  Team Identified Barrier to Discharge: Yes  Interventions:  Transfer training: Active  Gait training: Active  Wheelchair propulsion and management: Active  Mobility: Primary Team Goal/Status: Patient will perform household functional  mobility  mod I using LRAD in order to ensure safe d/c home. / Active    Problem: Impaired Self-care Mgmt/ADL/IADL  Self Care/ADL/IADL Status Update: Pt is CGA -Min A for ADL for balance due to  decreased ability to tolerate WB on R foot due to ulcer.  Team Identified Barrier to Discharge: Yes  Interventions:  Adaptive equipment training: Active  Compensatory strategies: Active  Encourage patient to participate in activities of daily living: Active  Self Care: Primary Team Goal/Status: Pt will be Mod I in ADL and functional  mobility and adhere to sternal precautions 100% of the time with no cuing. /  Active    Problem: Impaired Skin/Wound Mgmt  Skin Wound Status Update: Rt foot wound and dependent in care/mgt  Team Identified Barrier to Discharge: Yes  Interventions:  Turning schedule: Active  Other Skin Wound Intervention 1 - Text: education  Other Skin Wound Intervention 1 - Status: Active  Skin Wound: Primary Team Goal/Status: Patient and caregiver will be independent  of wound care / Active    Comments: The patient's nutrition and pain will be optimized.    Signed by: Antony Contras, M.D. 03/16/2014 4:41:00 PM    Physician CoSigned By: Antony Contras 03/16/2014 16:41:30

## 2014-03-16 NOTE — Rehab IRF Data Coll Rights Priv Act ([facility]) (Signed)
[name]  MRN: [id number]  Account: [id number]  Session Start: 03/15/2014 12:00:00 AM  Session Stop: 03/15/2014 12:00:00 AM      IRFPPS Data Collection Rights and Rehab Privacy Act Notice: IRF-PAI Data  Collection Rights and Rehab Privacy Act Statement provided/given to patient and  family.    Signed by: Peggye Pitt, Rehab Tech 03/15/2014 8:00:00 AM

## 2014-03-17 LAB — CBC AND DIFFERENTIAL
Basophils Absolute Automated: 0.02 10*3/uL (ref 0.00–0.20)
Basophils Automated: 0 %
Eosinophils Absolute Automated: 0.39 10*3/uL (ref 0.00–0.70)
Eosinophils Automated: 6 %
Hematocrit: 29.8 % — ABNORMAL LOW (ref 37.0–47.0)
Hgb: 8.8 g/dL — ABNORMAL LOW (ref 12.0–16.0)
Immature Granulocytes Absolute: 0.02 10*3/uL
Immature Granulocytes: 0 %
Lymphocytes Absolute Automated: 1.01 10*3/uL (ref 0.50–4.40)
Lymphocytes Automated: 16 %
MCH: 26.4 pg — ABNORMAL LOW (ref 28.0–32.0)
MCHC: 29.5 g/dL — ABNORMAL LOW (ref 32.0–36.0)
MCV: 89.5 fL (ref 80.0–100.0)
MPV: 9.7 fL (ref 9.4–12.3)
Monocytes Absolute Automated: 0.55 10*3/uL (ref 0.00–1.20)
Monocytes: 8 %
Neutrophils Absolute: 4.5 10*3/uL (ref 1.80–8.10)
Neutrophils: 70 %
Nucleated RBC: 0 /100 WBC (ref 0–1)
Platelets: 287 10*3/uL (ref 140–400)
RBC: 3.33 10*6/uL — ABNORMAL LOW (ref 4.20–5.40)
RDW: 22 % — ABNORMAL HIGH (ref 12–15)
WBC: 6.47 10*3/uL (ref 3.50–10.80)

## 2014-03-17 LAB — COMPREHENSIVE METABOLIC PANEL
ALT: 13 U/L (ref 0–55)
AST (SGOT): 17 U/L (ref 5–34)
Albumin/Globulin Ratio: 0.9 (ref 0.9–2.2)
Albumin: 2.8 g/dL — ABNORMAL LOW (ref 3.5–5.0)
Alkaline Phosphatase: 78 U/L (ref 37–106)
Anion Gap: 8 (ref 5.0–15.0)
BUN: 32 mg/dL — ABNORMAL HIGH (ref 7–19)
Bilirubin, Total: 0.2 mg/dL (ref 0.2–1.2)
CO2: 24 mEq/L (ref 22–29)
Calcium: 8.6 mg/dL (ref 8.5–10.5)
Chloride: 109 mEq/L (ref 100–111)
Creatinine: 1.9 mg/dL — ABNORMAL HIGH (ref 0.6–1.0)
Globulin: 3 g/dL (ref 2.0–3.6)
Glucose: 114 mg/dL — ABNORMAL HIGH (ref 70–100)
Potassium: 5.2 mEq/L — ABNORMAL HIGH (ref 3.5–5.1)
Protein, Total: 5.8 g/dL — ABNORMAL LOW (ref 6.0–8.3)
Sodium: 141 mEq/L (ref 136–145)

## 2014-03-17 LAB — GLUCOSE WHOLE BLOOD - POCT
Whole Blood Glucose POCT: 110 mg/dL — ABNORMAL HIGH (ref 70–100)
Whole Blood Glucose POCT: 139 mg/dL — ABNORMAL HIGH (ref 70–100)
Whole Blood Glucose POCT: 167 mg/dL — ABNORMAL HIGH (ref 70–100)
Whole Blood Glucose POCT: 117 mg/dL — ABNORMAL HIGH (ref 70–100)

## 2014-03-17 LAB — GFR: EGFR: 32.1

## 2014-03-17 MED ORDER — SODIUM BICARBONATE 650 MG PO TABS
650.0000 mg | ORAL_TABLET | Freq: Two times a day (BID) | ORAL | Status: DC
Start: 2014-03-17 — End: 2014-03-19
  Administered 2014-03-17 – 2014-03-19 (×4): 650 mg via ORAL
  Filled 2014-03-17 (×4): qty 1

## 2014-03-17 MED ORDER — CALCITRIOL 0.25 MCG PO CAPS
0.25 ug | ORAL_CAPSULE | Freq: Every day | ORAL | Status: DC
Start: 2014-03-17 — End: 2014-03-25
  Administered 2014-03-17 – 2014-03-25 (×9): 0.25 ug via ORAL
  Filled 2014-03-17 (×9): qty 1

## 2014-03-17 MED ORDER — HYDRALAZINE HCL 25 MG PO TABS
75.0000 mg | ORAL_TABLET | Freq: Three times a day (TID) | ORAL | Status: DC
Start: 2014-03-17 — End: 2014-03-22
  Administered 2014-03-17 – 2014-03-22 (×13): 75 mg via ORAL
  Filled 2014-03-17 (×15): qty 3

## 2014-03-17 NOTE — Rehab PPS CMG ([facility]) (Signed)
[name]  MRN: [id number]  Account: [id number]  Session Start: 03/16/2014 12:00:00 AM  Session Stop: 03/16/2014 12:00:00 AM    PPS CMG Coordinator  Inpatient Rehabilitation Admission    IRF Admission Date:  03/14/2014      Admission Class: Initial Rehab.  [facility]  Pre-Hospital Living: Home. Pre-Hospital Living  With: (2) Family/Relatives.  Payer Source: Primary: Not Listed.  Additional Information: [employer].  Secondary: Not Listed.  Additional Information: None.  Ethnic Group:  Leisure centre manager.  Marital Status:  Marital Status: Widowed.    Impairment Group: 09 Cardiac  Date of Onset of Impairment: 02/26/2014    Etiologic Diagnosis Code(s):   Rank Code      Description  1    I25.10    Atherosclerotic heart disease of native                 coronary artery without angina pectoris    Comorbidities:   Rank Code      Description    1    E11.9     Type 2 diabetes mellitus without complications  2    E88.09    Other disorders of plasma-protein metabolism,                 not elsewhere classified  3    I73.9     Peripheral vascular disease, unspecified    Are there any arthritis conditions recorded for Impairment Group, Etiologic  Diagnosis, or Comorbid Conditions that meet all of the regulatory requirements  for IRF classification (in 42 CFR 412.29(b)(2)(x), (xi), and xii))? No    PAI Bladder Accidents:  0 - Accidents.    Patient used medications/device this  shift.  03/15/2014 5:32:00 AM  Bladder Score = 6. Patient has not had an accident, but uses a  device/medication.  PAI Bowel Accident: 0 -Accidents.    Patient used medications/device this shift.   03/15/2014 5:32:00 AM  Bowel Score = 6. Patient has no accidents, but uses a device/medications.    MEDICAL NEEDS  Height on Admission: 61 inches.  Weight on Admission: 156 pounds.  Swallowing Status: Swallowing Status: Regular Food: solids and liquids swallowed  safely without supervision or modified food  consistencies.    QUALITY INDICATORS  Pressure Ulcers: Unhealed Pressure Ulcer(s) Present on Admission: No.  Pressure Ulcer Risk Condition on Admission: Peripheral Vascular Disease (PVD):  Yes.  Peripheral Arterial Disease (PAD): Yes.  Diabetes Mellitus (DM): Yes.  Diabetes Retinopathy: No.  Diabetes Nephropathy: No.  Diabetes Neuropathy: No.    Signed by: Blenda Mounts, RN 03/16/2014 4:00:00 PM

## 2014-03-17 NOTE — Rehab Progress Note ([facility]) (Signed)
[name]  MRN: [id number]  Account: [id number]  Session Start: 03/17/2014 12:00:00 AM  Session Stop: 03/17/2014 12:00:00 AM    SEVERE SEPSIS SCREEN  INFECTION:  Patient has no indication of infection.  Negative Sepsis Screen.  If you are unable to assess a system's dysfunction because you do not have labs,  or the labs you have are not current (within 24 hours), call physician and  request and order for the lab tests needed.    Signed by: Coralyn Pear, RN 03/17/2014 8:45:00 AM

## 2014-03-17 NOTE — Rehab Progress Note ([facility]) (Signed)
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    OBJECTIVE  General Observations: Pt received seated at edge of bed. vertical sternal  incision. Arterial wound on right foot is covered, so not inspected. Pt is  agreeable to PT at this time. No lines or leads noted.  Vital Signs:                       Current Value                Previous Value  Vitals  BP Systolic          -                            158/67  Pulse                -                            84  Respirations         -                            20    Interventions:       Therapeutic Activities:  The session was focused on improving ROM at right  LE and improving tolerance and mechanics of gait. Pt was able to tolerate 15  mins Nustep activity with LEs only, at level 5. Goal of activity was for passive  stretch of right ankle DFs, and LE strengthening. Pt ambulated 80 feet using RW  with facilitation at right gluts to reduce hyperextension of right knee during  stance phase. Pt was left seated in w/c at bedside with all needs met.  Pain Reassessment:  Response to Pain Intervention: pt reported pain at right ankle after session;  she did not rate pain and pain alleviated after rest break in sitting.  Post Intervention Pain Quality:  Aching.  Patient Reports Post Intervention Pain Level of: Patient is unable to articulate  level of pain. Patient demonstrates: Guarded Movements. Repositioned patient.    Education Provided:    Education Provided: Gait.       Audience: Patient.       Mode: Explanation.       Response:  Needs practice.  Needs reinforcement.    ASSESSMENT  Pt tolerated longer distance ambulation this session using RW. She benefited  greatly from facilitation at right gluts to reduce hyperextension at right knee,  with noted atrophy of right gluts. Pt will benefit from continued PT in order to  maximize safety with mobility, to reduce risk for cardiac complications and to  prepare for safe d/c home.    PLAN  Continued Physical Therapy is recommended.  Recommended Frequency/Duration/Intensity: 1-2 hours per day, 5-6 days per week  for 2 weeks after initial eval on 03/15/14  Activities Contributing Toward Care Plan: bed mobility, transfer training,  aerobic and endurance training, balance activity, strengthening, procurement of  DME, caregiver training, pt education and training on falls prevention, etc.    3 Hour Rule Minutes: 60 minutes of PT treatment this session count towards  intensity and duration of therapy requirement. Patient was seen for the full  scheduled time of PT treatment this session.  Therapy Mode Minutes: Individual: 60 minutes.    Signed by: Arnoldo Hooker, PT  03/17/2014 12:00:00 PM

## 2014-03-17 NOTE — Rehab Progress Note ([facility]) (Signed)
[name]  MRN: [id number]  Account: [id number]  Session Start: 03/17/2014 12:00:00 AM  Session Stop: 03/17/2014 12:00:00 AM    SEVERE SEPSIS SCREEN  INFECTION:  Patient has no indication of infection.  Negative Sepsis Screen.  If you are unable to assess a system's dysfunction because you do not have labs,  or the labs you have are not current (within 24 hours), call physician and  request and order for the lab tests needed.    Signed by: Candida Peeling, RN 03/17/2014 9:00:00 PM

## 2014-03-17 NOTE — Rehab Progress Note ([facility]) (Signed)
[name]  MRN: [id number]  Account: [id number]  Session Start: 03/17/2014 1:00:00 PM  Session Stop: 03/17/2014 2:00:00 PM    Occupational Therapy  Inpatient Rehabilitation Progress Note - Brief    Rehab Diagnosis: Debility secondary to NSTEMI and multivessel CAD S/P CABG  Demographics:            Age: 43Y            Gender: Female  Rehabilitation Precautions/Restrictions:   Falls, Infection, Sternal precautions    SUBJECTIVE  Patient Report: "I am tired but Ill work."  Pain: Patient currently without complaints of pain.    OBJECTIVE  General Observation: Pt recieved seated in w/c alert and ready to begin tx  session. Pt agreeable to participate in OT tx session.    Interventions:       Therapeutic Activities:  Tx session functional endurance and strength  required to safely complete rote ADls and functional transfers. Pt tolerated  standing  tabletop task for approx 15 min requiring SBA demonstrating increased  overall endurance/strength. Provided functional stretching to RLE to facilitate  planterflexion required to complete functional transfers nd mobility. Pt  returned to bed with all needs met, call bell within reach, and RN aware of pts  location.    ASSESSMENT  Increased standing tolerance noted during tx session however benefits from  frequent cuing to facilitate weight shifting on RLE and adhere to sternal  precautions during fucntional  mobilty. Cont with OT POC as pt remains excellent  rehab candidate.    PLAN  Continued Occupational Therapy is recommended.  Recommended Frequency/Duration/Intensity: 60-120 minutes, 5-6x week for 7-10  days from initial eval on 03/15/14  Continued Activities Contributing Toward Care Plan: ADL/IADL retraining, AE  training, sternal precautions, TherEx, TherAct, d/c planning    3 Hour Rule Minutes: 60 minutes of OT treatment this session count towards  intensity and duration of therapy requirement. Patient was seen for the full  scheduled time of OT treatment this  session.  Therapy Mode Minutes: Individual: 60 minutes.    Signed by: Lessie Dings, M.S., OTR/L 03/17/2014 2:00:00 PM

## 2014-03-17 NOTE — Rehab Progress Note ([facility]) (Addendum)
Corrected 03/17/2014 12:24:16 AM    NAME: [name]  MRN: [id number]  Account: [id number]  Session Start: 03/16/2014 12:00:00 AM  Session Stop: 03/16/2014 12:00:00 AM    Rehabilitation Nursing  Inpatient Rehabilitation Shift Assessment    Rehab Diagnosis: Debility secondary to NSTEMI and multivessel CAD S/P CABG  Demographics:            Age: 63Y            Gender: Female  Primary Language: English    Date of Onset:  02/26/14  Date of Admission: 03/14/2014 6:35:00 PM    Rehabilitation Precautions Restrictions:   Falls, Infection,    Patient Report: " I feel okay"  Pain: Patient currently without complaints of pain.  Pain Reassessment:  Pain was not reassessed as no pain was reported.  Patient/Caregiver Goals:  To go home and live normally    NEURO  Orientation/Awareness: Alert and Oriented x3.  Speech: No deficits noted at this time.  Behavior: Cooperative.    MEDICATIONS  IV Access: No IV access.  Dialysis Access: Patient does not have dialysis access.      Elopement Risk Level Assessment Tool  Patient Criteria: Patient is not capable of leaving the unit.  Assessment is not  applicable.      RISK ASSESSMENT FOR FALLS/INJURY    MENTAL STATUS CRITERIA:   0 - None identified.  MENTAL STATUS TOTAL: 0    AGE CRITERIA:   64 - < 79 years old  AGE TOTAL: 0    ELIMINATION CRITERIA:   3 - Toileting with Assistance.   ELIMINATION TOTAL: 3    HISTORY OF FALLS CRITERIA:   2 - Unknown History.  HISTORY OF FALLS TOTAL: 2    MEDICATIONS CRITERIA:   2 or more High Risk Medications (*see list below)   MEDICATIONS TOTAL: 2    PHYSICAL MOBILITY CRITERIA:   3 - Decreased balance reaction.   1 - Weakness/impaired physical mobility.   PHYSICAL MOBILITY TOTAL: 4    FALLS RISK ASSESSMENT TOTAL: 11    Patient's Fall Risk: TOTAL SCORE >10: High Risk    Falls Interventions: Clutter removed and clear path to BR.  Call bell, phone, glasses, etc within reach.  Hourly toileting/safety checks between 6am and 10pm, then every 2 hours.  Initiate Fall  care plan and outcome.  Yellow "high risk" patient identification in place: wrist band, socks, chart  sticker, door sign.  Pt and family education.  Assistive devices at [facility]    NUTRITION  Diet:  Type: Consistent carbohydrate.  Food Consistency: Regular.  Liquid Consistency: Thin.      CARDIOVASCULAR     Bilateral lower extremities  Nail Bed Color: Pink.   +1 edema to anterior shins  Pulses:   Apical Pulse: Regular. Strong. Rate is 78 .   Patient does not have a pacemaker.   Patient does not have a defibrillator.    CARDIOPULMONARY  Lung Sounds:   Upper lobes. Clear.   Lower lobes. Clear.  Type of Respirations: Regular.  Cough: No cough noted.  Respiratory Support: The patient does not require any respiratory support.  Respiratory Equipment: None.    INTEGUMENTARY  Skin:  Temperature: Warm  Turgor: Normal for age  Moisture: Dry  Color of skin: Normal for Race/Ethnicity  Capillary Refill: Less than 3 seconds  Wounds/Incisions:     Surgical Incision: Sternal incision Length: 17 centimeter(s) with glue. No  signs of infection.  Drainage: Incision without drainage.  Odor:  No  Incision Care: Incision healing     Surgical Incision: Drainage sites x 3, 1 cm each incision Length: 1  centimeter(s) with glue. No signs of infection.  Drainage: Incision without drainage.  Odor:  No  Incision Care: Incision healing     Wound/incision not assessed this shift.   Right Ankle Open Wound.Dressing changed by previous shift RN, C/D/I.  Braden Scale for Predicting Pressure Sore Risk: Sensory Perception: No  impairment  Moisture: Rarely moist  Activity: Walks occasionally  Mobility: Slightly limited  Nutrition: Adequate  Friction and Shear: No apparent problem  Braden Score: 20  Level of Risk: No risk (19-23). Will reassess every shift.    GASTROINTESTINAL  Abdomen: Soft. Nontender.  Bowel Sounds:  Active bowel sounds audible in all four quadrants.  Date of Last Bowel Movement:  03/15/14   No Problems/Complaints with Bowel Elimination  Assessed.    GENITOURINARY  Current Bladder Pattern: Continent  Color:  Yellow   Patient denies problems with urination and/or catheter.    MUSCULOSKELETAL  Upper Body: Sternal Precautions in place  Lower Body:    Functional Measures    BLADDER MANAGEMENT - LEVEL OF ASSIST: Bladder Score = 5.  Patient is  supervision/set-up for bladder management, requiring: Setting out equipment.  Emptying equipment.  Patient requires the following assistive device(s): Bedside Commode.    BLADDER ACCIDENTS THIS SHIFT:  0 . Patient has not had an accident but used a  bedside commode this assessment.    BOWEL MANAGEMENT - LEVEL OF ASSIST: Bowel Score = 7.  Patient is completely  independent for bowel management.  Patient did not have bowel movement. No  medication/intervention was provided.    BOWEL ACCIDENTS THIS SHIFT: 0 . Patient has not had an accident and did not  require medications or devices.    Education Provided:    Education Provided: Precautions. Pain management. Pain scale. Medication  options. Side effects. Safety issues and interventions. Fall protocol.  Skin/wound care. Signs/symptoms of infection. Medication. Name and dosage.  Administration. Purpose. Side Effects.       Audience: Patient.       Mode: Explanation.       Response: Verbalized understanding.    Discharge: Patient is not being discharged at this time.    Long Term Goals:  1. Patient will be able to demonstrate skill in wound care,  dressing changes and skin monitoring with out queing.   2. Patient will perform wound dressing changes independently.   3. Patient will demonstrate safe transfers from bed to wheelchair, bed to chair  and and bed to bsc using sternal precautions.   4. Patient will communicate her understandfing of s/s of  wound infection and  when to call the doctor.   5. Patient will demonstrate her understanding of DM and how to monitor BS, adm  insulin, and s/s of hypo and hyperglycemia.  Jan 11th (2 weeks)  Short Term Goals:  1. Patient  will be able to call 100 percent of the time for  help when getting out of bed. She will communicate her understanding for  assistance. - Goal Not Met   2. Patient will be able to, at the end of the week demonstrate good hand  washing techniques, and wound care for lower right extremity with reach bar if  needed.  January 4th, one week    PROGRESS TOWARD GOALS: All goals are progressing.    PLAN: Nursing Specific Interventions  Pain Management. Skin Management. Wound Management.  Continue  with the current Nursing Plan of Care.    TEAM CARE PLAN  Identified problems from team documentation:  Problem: Impaired Endocrine/Metabolic Function  Endocrine: Primary Team Goal: Patient will demonstrate understanding of DM  management to include adm of insulin, s/s of hypo/ hyper glycemia./Active    Problem: Impaired Mobility  Mobility: Primary Team Goal: Patient will perform household functional mobility  mod I using LRAD in order to ensure safe d/c home./Active    Problem: Impaired Self-care Mgmt/ADL/IADL  Self Care: Primary Team Goal: Pt will be Mod I in ADL and functional mobility  and adhere to sternal precautions 100% of the time with no cuing./Active    Problem: Impaired Skin/Wound Mgmt  Skin Wound: Primary Team Goal: Patient and caregiver will be independent of  wound care/Active    Add/Update Problems from this assessment:  No updates at this time.    Please review Integrated Patient View Care Plan Flowsheet for Team identified  Problems, Interventions, and Goals.    Signed by: Candida Peeling, RN 03/16/2014 10:00:00 PM

## 2014-03-17 NOTE — Rehab Progress Note ([facility]) (Signed)
[name]  MRN: [id number]  Account: [id number]  Session Start: 03/16/2014 9:00:00 AM  Session Stop: 03/16/2014 10:00:00 AM    Physical Therapy  Inpatient Rehabilitation Progress Note - Brief    Rehab Diagnosis: Debility secondary to NSTEMI and multivessel CAD S/P CABG  Demographics:            Age: 28Y            Gender: Female  Rehabilitation Precautions/Restrictions:   Falls, Infection,    SUBJECTIVE  Patient Report: "I'm trying so hard but it hurts."  Pain: Patient currently without complaints of pain.    OBJECTIVE  General Observations: Pt received in w/c; vertical sternal incision. Arterial  wound on right foot is covered, so not inspected. Pt is agreeable to PT at this  time. No lines or leades noted.  Vital Signs:                       Current Value                Previous Value  Vitals  BP Systolic          -                            158/67  Pulse                -                            84  Respirations         -                            20    Interventions:       Therapeutic Activities:  Session focused on improving ROM at right ankle  for improved gait mechanics. Pt performed knee extension and flexion with RLE on  skate, in order to stretch PFs at right ankle. She required some over pressure  for passive stretch. Pt performed squatting activity with the same goal,  requiring stabilization at right knee to prevent hyperextension. She was left  seated in w/c handed off to TR therapist for next session. All needs met.  Pain Reassessment:  Response to Pain Intervention: pt reported pain in left ankle/foot. She did not  rate pain, pain subsided after rest breaks.  Post Intervention Pain Quality:  Aching.  Patient Reports Post Intervention Pain Level of: Patient is unable to articulate  level of pain. Patient demonstrates: Event organiser. Calling Out. No pain  intervention was facilitated because denies pain/discomfort at this time.    Education Provided:    Education Provided: English as a second language teacher. Functional transfers.       Audience: Patient.       Mode: Explanation.  Demonstration.       Response: Needs reinforcement.    ASSESSMENT  Mrs. Mohl presents with improving tolerance for PROM and active assisted ROM at  right ankle, but was unable to tolerate assessment of carry over into gait. she  will benefit from continued PT in order to maximize safety with mobility, to  reduce fall risk and risk for cardiac complications, and to prepare for safe d/c  home.    PLAN  Continued Physical Therapy is recommended.  Recommended Frequency/Duration/Intensity: 1-2 hours per day, 5-6 days per week  for 2 weeks after initial eval on 03/15/14  Activities Contributing Toward Care Plan: bed mobility, transfer training,  aerobic and endurance training, balance activity, strengthening, procurement of  DME, caregiver training, pt education and training on falls prevention, etc.    3 Hour Rule Minutes: 60 minutes of PT treatment this session count towards  intensity and duration of therapy requirement. Patient was seen for the full  scheduled time of PT treatment this session.  Therapy Mode Minutes: Individual: 60 minutes.    Signed by: Arnoldo Hooker, PT  03/16/2014 10:00:00 AM

## 2014-03-17 NOTE — Rehab Progress Note ([facility]) (Addendum)
Corrected 03/17/2014 11:09:24 PM    NAME: [name]  MRN: [id number]  Account: [id number]  Session Start: 03/17/2014 12:00:00 AM  Session Stop: 03/17/2014 12:00:00 AM    Rehabilitation Nursing  Inpatient Rehabilitation Shift Assessment    Rehab Diagnosis: Debility secondary to NSTEMI and multivessel CAD S/P CABG  Demographics:            Age: 63Y            Gender: Female  Primary Language: English    Date of Onset:  02/26/14  Date of Admission: 03/14/2014 6:35:00 PM    Rehabilitation Precautions Restrictions:   Falls, Infection,    Patient Report: " I feel okay"  Pain: Patient currently without complaints of pain.  Pain Reassessment:  Pain was not reassessed as no pain was reported.  Patient/Caregiver Goals:  To go home and live normally    NEURO  Orientation/Awareness: Alert and Oriented x3.  Speech: No deficits noted at this time.  Behavior: Cooperative.    MEDICATIONS  IV Access: No IV access.  Dialysis Access: Patient does not have dialysis access.      Elopement Risk Level Assessment Tool  Patient Criteria: Patient is not capable of leaving the unit.  Assessment is not  applicable.      RISK ASSESSMENT FOR FALLS/INJURY    MENTAL STATUS CRITERIA:   0 - None identified.  MENTAL STATUS TOTAL: 0    AGE CRITERIA:   29 - < 81 years old  AGE TOTAL: 0    ELIMINATION CRITERIA:   3 - Toileting with Assistance.   ELIMINATION TOTAL: 3    HISTORY OF FALLS CRITERIA:   2 - Unknown History.  HISTORY OF FALLS TOTAL: 2    MEDICATIONS CRITERIA:   2 or more High Risk Medications (*see list below)   MEDICATIONS TOTAL: 2    PHYSICAL MOBILITY CRITERIA:   3 - Decreased balance reaction.   1 - Weakness/impaired physical mobility.   PHYSICAL MOBILITY TOTAL: 4    FALLS RISK ASSESSMENT TOTAL: 11    Patient's Fall Risk: TOTAL SCORE >10: High Risk    Falls Interventions: Clutter removed and clear path to BR.  Call bell, phone, glasses, etc within reach.  Hourly toileting/safety checks between 6am and 10pm, then every 2 hours.  Initiate Fall  care plan and outcome.  Yellow "high risk" patient identification in place: wrist band, socks, chart  sticker, door sign.  Pt and family education.  Assistive devices at [facility].    NUTRITION  Diet:  Type: Consistent carbohydrate.  Food Consistency: Regular.  Liquid Consistency: Thin.      CARDIOVASCULAR     Bilateral lower extremities  Nail Bed Color: Pink.   +1 edema to anterior shins  Pulses:   Apical Pulse: Regular. Strong. Rate is 77 .   Patient does not have a pacemaker.   Patient does not have a defibrillator.    CARDIOPULMONARY  Lung Sounds:   Upper lobes. Clear.   Lower lobes. Clear.  Type of Respirations: Regular.  Cough: No cough noted.  Respiratory Support: The patient does not require any respiratory support.  Respiratory Equipment: None.    INTEGUMENTARY  Skin:  Temperature: Warm  Turgor: Normal for age  Moisture: Dry  Color of skin: Normal for Race/Ethnicity  Capillary Refill: Less than 3 seconds  Wounds/Incisions:     Surgical Incision: Sternal incision Length: 17 centimeter(s) with glue. No  signs of infection.  Drainage: Incision without drainage.  Odor:  No  Incision Care: Incision healing     Surgical Incision: Drainage sites x 3, 1 cm each incision Length: 1  centimeter(s) with glue. No signs of infection.  Drainage: Incision without drainage.  Odor:  No  Incision Care: Incision healing     Wound/incision not assessed this shift.   Right Ankle Open Wound.Dressing changed by previous shift RN, C/D/I.  Braden Scale for Predicting Pressure Sore Risk: Sensory Perception: No  impairment  Moisture: Rarely moist  Activity: Walks occasionally  Mobility: Slightly limited  Nutrition: Adequate  Friction and Shear: No apparent problem  Braden Score: 20  Level of Risk: No risk (19-23). Will reassess every shift.    GASTROINTESTINAL  Abdomen: Soft. Nontender.  Bowel Sounds:  Active bowel sounds audible in all four quadrants.  Date of Last Bowel Movement:  03/15/14   No Problems/Complaints with Bowel Elimination  Assessed.    GENITOURINARY  Current Bladder Pattern: Continent  Color:  Yellow   Patient denies problems with urination and/or catheter.    MUSCULOSKELETAL  Upper Body: Sternal Precautions in place  Lower Body:    Functional Measures    BLADDER MANAGEMENT - LEVEL OF ASSIST: Bladder Score = 5.  Patient is  supervision/set-up for bladder management, requiring: Setting out equipment.  Emptying equipment.  Patient requires the following assistive device(s): Bedside Commode.    BLADDER ACCIDENTS THIS SHIFT:  0 . Patient has not had an accident but used a  bedside commode this assessment.    BOWEL MANAGEMENT - LEVEL OF ASSIST: Bowel Score = 7.  Patient is completely  independent for bowel management.  Patient did not have bowel movement. No  medication/intervention was provided.    BOWEL ACCIDENTS THIS SHIFT: 0 . Patient has not had an accident and did not  require medications or devices.    Education Provided:    Education Provided: Precautions. Pain management. Pain scale. Medication  options. Side effects. Safety issues and interventions. Fall protocol.  Skin/wound care. Signs/symptoms of infection. Medication. Name and dosage.  Administration. Purpose. Side Effects.       Audience: Patient.       Mode: Explanation.       Response: Verbalized understanding.    Discharge: Patient is not being discharged at this time.    Long Term Goals:  1. Patient will be able to demonstrate skill in wound care,  dressing changes and skin monitoring with out queing.   2. Patient will perform wound dressing changes independently.   3. Patient will demonstrate safe transfers from bed to wheelchair, bed to chair  and and bed to bsc using sternal precautions.   4. Patient will communicate her understandfing of s/s of  wound infection and  when to call the doctor.   5. Patient will demonstrate her understanding of DM and how to monitor BS, adm  insulin, and s/s of hypo and hyperglycemia.  Jan 11th (2 weeks)  Short Term Goals:  1. Patient  will be able to call 100 percent of the time for  help when getting out of bed. She will communicate her understanding for  assistance. - Goal Not Met   2. Patient will be able to, at the end of the week demonstrate good hand  washing techniques, and wound care for lower right extremity with reach bar if  needed.  January 4th, one week    PROGRESS TOWARD GOALS: All goals are progressing.    PLAN: Nursing Specific Interventions  Pain Management. Skin Management. Wound Management.  Continue  with the current Nursing Plan of Care.    TEAM CARE PLAN  Identified problems from team documentation:  Problem: Impaired Endocrine/Metabolic Function  Endocrine: Primary Team Goal: Patient will demonstrate understanding of DM  management to include adm of insulin, s/s of hypo/ hyper glycemia./Active    Problem: Impaired Mobility  Mobility: Primary Team Goal: Patient will perform household functional mobility  mod I using LRAD in order to ensure safe d/c home./Active    Problem: Impaired Self-care Mgmt/ADL/IADL  Self Care: Primary Team Goal: Pt will be Mod I in ADL and functional mobility  and adhere to sternal precautions 100% of the time with no cuing./Active    Problem: Impaired Skin/Wound Mgmt  Skin Wound: Primary Team Goal: Patient and caregiver will be independent of  wound care/Active    Add/Update Problems from this assessment:  No updates at this time.    Please review Integrated Patient View Care Plan Flowsheet for Team identified  Problems, Interventions, and Goals.    Signed by: Candida Peeling, RN 03/17/2014 9:30:00 PM

## 2014-03-17 NOTE — Rehab Progress Note ([facility]) (Signed)
[name]  MRN: [id number]  Account: [id number]  Session Start: 03/17/2014 9:00:00 AM  Session Stop: 03/17/2014 10:00:00 AM    Occupational Therapy  Inpatient Rehabilitation Progress Note - Brief    Rehab Diagnosis: Debility secondary to NSTEMI and multivessel CAD S/P CABG  Demographics:            Age: 76Y            Gender: Female  Rehabilitation Precautions/Restrictions:   Falls, Infection, Sternal    SUBJECTIVE  Patient Report: Pt reports "My right toes and foot feel more swollen than  usual."  Pain: Patient currently has pain.  Patient reports a pain level of 5 out of 10. premedicated for therapy, frequent  rest breaks    OBJECTIVE    Interventions:       Therapeutic Activities:  OT treatment addressed standing tolerance and  adherence to sternal precautions during functional mobility. OT provided  prompting for sternal precautions during sit to stands throughout treatment  session. Engaged in a meaningful leisure task in standing with rest breaks to  improve standing tolerance without UE support. Practiced functional ambulation  with RW for 58ft x2 with supervision and OT monitoring adherence to sternal  precautions. Pt left seated in wheelchair, all needs within reach.  Pain Reassessment:  Response to Pain Intervention: resting quietly in wheelchair  Post Intervention Pain Quality:  Stabbing. Throbbing.  Patient Reports Post Intervention Pain Level of: 6 out of 10  Pain Acceptable: No: premedicate for therapy, position for comfort    Education Provided:    Education Provided: Plan of care. Precautions. Functional transfers.       Audience: Patient.       Mode: Explanation.  Demonstration.       Response: Needs reinforcement.    ASSESSMENT  Pt's tolerance for standing is improving, but she continues to require cues to  weight bear through the right LE. She benefits from cues for sternal precautions  during ambulation as she continues to put min weight through bilateral UE.  Continue OT  POC.    PLAN  Continued Occupational Therapy is recommended.  Recommended Frequency/Duration/Intensity: 60-120 minutes, 5-6x week for 7-10  days from initial eval on 03/15/14  Continued Activities Contributing Toward Care Plan: ADL/IADL retraining, AE  training, sternal precautions, TherEx, TherAct, d/c planning    3 Hour Rule Minutes: 60 minutes of OT treatment this session count towards  intensity and duration of therapy requirement. Patient was seen for the full  scheduled time of OT treatment this session.  Therapy Mode Minutes: Individual: 60 minutes.    Signed by: Fransisco Beau, OTR/L 03/17/2014 10:00:00 AM

## 2014-03-17 NOTE — Progress Notes (Addendum)
PHYSICAL MEDICINE AND REHABILITATION  PROGRESS NOTE    Date Time: 03/17/2014 9:08 PM  Patient Name: [name], [name]    Admission date:  03/14/2014      Subjective:   The patient reports that the lidoderm patches help a lot of her pain in her sternum.  However, her daughter was reportedly concerned about "possible overdose."  Therefore, the patient did not want the Lidoderm patches anymore.  No problems with eating or drinking.  No new issues with bowel and bladder.  Able to sleep.  She still has some right foot pain.    Functional Status:     PT:  Therapeutic Activities: The session was focused on improving ROM at right  LE and improving tolerance and mechanics of gait. Pt was able to tolerate 15  mins Nustep activity with LEs only, at level 5. Goal of activity was for passive  stretch of right ankle DFs, and LE strengthening. Pt ambulated 80 feet using RW  with facilitation at right gluts to reduce hyperextension of right knee during  stance phase. Pt was left seated in w/c at bedside with all needs met.  Pain Reassessment:  Response to Pain Intervention: pt reported pain at right ankle after session;  she did not rate pain and pain alleviated after rest break in sitting.  Post Intervention Pain Quality: Aching.  Patient Reports Post Intervention Pain Level of: Patient is unable to articulate  level of pain. Patient demonstrates: Guarded Movements. Repositioned patient.    OT:  Therapeutic Activities: Tx session functional endurance and strength  required to safely complete rote ADls and functional transfers. Pt tolerated  standing tabletop task for approx 15 min requiring SBA demonstrating increased  overall endurance/strength. Provided functional stretching to RLE to facilitate  planterflexion required to complete functional transfers nd mobility.    Medications:   Medication reviewed by me:     Scheduled Meds: PRN Meds:        amiodarone 200 mg Oral Daily   amLODIPine 10 mg Oral Daily   aspirin EC 325 mg Oral Daily    atorvastatin 10 mg Oral QHS   cadexomer iodine  Topical Daily   calcitRIOL 0.25 mcg Oral Daily   carvedilol 25 mg Oral Q12H SCH   furosemide 20 mg Oral Daily   gabapentin 300 mg Oral BID   heparin (porcine) 5,000 Units Subcutaneous Q12H SCH   hydrALAZINE 75 mg Oral TID   insulin aspart 3 Units Subcutaneous Once   polyethylene glycol 17 g Oral Daily   sodium bicarbonate 650 mg Oral BID   venlafaxine 75 mg Oral BID       Continuous Infusions:      acetaminophen 650 mg Q6H PRN   albuterol-ipratropium 3 mL Q2H PRN   bisacodyl 10 mg QD PRN   dextrose 25 mL PRN   glucagon (rDNA) 1 mg PRN   insulin aspart 1-8 Units PRN   magnesium hydroxide 10 mL Q6H PRN   oxyCODONE-acetaminophen 1 tablet Q4H PRN   traMADol 50 mg Q6H PRN           Review of Systems:   A comprehensive review of systems was: No fevers, chills, nausea, vomiting, chest pain, shortness of breath, cough, headache, double vision.  All others negative.    Physical Exam:     Filed Vitals:    03/16/14 1952 03/17/14 0540 03/17/14 0849 03/17/14 2032   BP: 141/64 158/67 162/62 144/60   Pulse: 78 84 85 77   Temp: 97.9 F (  36.6 C) 99 F (37.2 C)  98.1 F (36.7 C)   TempSrc:       Resp: 18 20 18 18    SpO2: 96% 95% 95% 94%       Intake and Output Summary (Last 24 hours) at Date Time  No intake or output data in the 24 hours ending 03/17/14 2108                    Cardiac: regular rhythm  Chest / Lungs:  Clear to auscultation.  Abdomen:  + bowel sounds, Soft, non-tender, non-distended.  Extremities: no calf tenderness.  Right dorsal foot with dressing and Ace.  Right ankle 1+ edema.    Labs:   No results for input(s): GLUCOSEWHOLE in the last 24 hours.      Recent Labs  Lab 03/17/14  0510 03/15/14  0522   WBC 6.47 5.28   HEMOGLOBIN 8.8* 9.0*   HEMATOCRIT 29.8* 31.0*   PLATELETS 287 275          Recent Labs  Lab 03/17/14  0510 03/15/14  0522 03/14/14  0513 03/13/14  0353   SODIUM 141 141 141 141   POTASSIUM 5.2* 5.3* 4.9 5.1   CHLORIDE 109 109 109 110   CO2 24 26 26 27     BUN 32* 27* 28.0* 28.0*   CREATININE 1.9* 1.7* 1.6* 1.7*   CALCIUM 8.6 8.5 8.4* 8.0*   ALBUMIN 2.8* 2.8*  --   --    PROTEIN, TOTAL 5.8* 5.8*  --   --    BILIRUBIN, TOTAL 0.2 0.2  --   --    ALKALINE PHOSPHATASE 78 73  --   --    ALT 13 12  --   --    AST (SGOT) 17 13  --   --    GLUCOSE 114* 113* 103* 108*       No results for input(s): INR, APTT in the last 168 hours.    Results     Procedure Component Value Units Date/Time    Glucose Whole Blood - POCT [[id number]]  (Abnormal) Collected:  03/17/14 1652     POCT - Glucose Whole blood 167 (H) mg/dL Updated:  40/98/11 9147    Glucose Whole Blood - POCT [[id number]]  (Abnormal) Collected:  03/17/14 1159     POCT - Glucose Whole blood 139 (H) mg/dL Updated:  86/57/84 6962    Comprehensive metabolic panel [[id number]]  (Abnormal) Collected:  03/17/14 0510    Specimen Information:  Blood Updated:  03/17/14 0604     Glucose 114 (H) mg/dL      BUN 32 (H) mg/dL      Creatinine 1.9 (H) mg/dL      Sodium 401 mEq/L      Potassium 5.2 (H) mEq/L      Chloride 109 mEq/L      CO2 24 mEq/L      CALCIUM 8.6 mg/dL      Protein, Total 5.8 (L) g/dL      Albumin 2.8 (L) g/dL      AST (SGOT) 17 U/L      ALT 13 U/L      Alkaline Phosphatase 78 U/L      Bilirubin, Total 0.2 mg/dL      Globulin 3.0 g/dL      Albumin/Globulin Ratio 0.9      Anion Gap 8.0     GFR [[id number]] Collected:  03/17/14 0510     EGFR 32.1 Updated:  03/17/14 0604    CBC and differential [[id number]]  (Abnormal) Collected:  03/17/14 0510    Specimen Information:  Blood / Blood Updated:  03/17/14 0534     WBC 6.47 x10 3/uL      Hgb 8.8 (L) g/dL      Hematocrit 64.4 (L) %      Platelets 287 x10 3/uL      RBC 3.33 (L) x10 6/uL      MCV 89.5 fL      MCH 26.4 (L) pg      MCHC 29.5 (L) g/dL      RDW 22 (H) %      MPV 9.7 fL      Neutrophils 70 %      Lymphocytes Automated 16 %      Monocytes 8 %      Eosinophils Automated 6 %      Basophils Automated 0 %      Immature Granulocyte 0 %      Nucleated RBC 0 /100 WBC       Neutrophils Absolute 4.50 x10 3/uL      Abs Lymph Automated 1.01 x10 3/uL      Abs Mono Automated 0.55 x10 3/uL      Abs Eos Automated 0.39 x10 3/uL      Absolute Baso Automated 0.02 x10 3/uL      Absolute Immature Granulocyte 0.02 x10 3/uL     Glucose Whole Blood - POCT [[id number]]  (Abnormal) Collected:  03/17/14 0526     POCT - Glucose Whole blood 110 (H) mg/dL Updated:  63/87/56 4332    Glucose Whole Blood - POCT [[id number]]  (Abnormal) Collected:  03/16/14 2104     POCT - Glucose Whole blood 151 (H) mg/dL Updated:  09/15/14 0109               Rads:   Radiological Procedure reviewed.  Radiology Results (24 Hour)     ** No results found for the last 24 hours. **              Assessment and Plan:     Ambulation and activities of daily living dysfunction due to cardiac debility  Status post MI  Status post coronary artery bypass graft with multivessel coronary artery disease  - making gains with PT and OT.  - Estimated length of stay is about 2 weeks.  - Continue with current pain medications.  - Lidoderm will be discontinued as requested.  Blood loss anemia  - Follow trend.  Fairly stable.  Followup periodically.  History of anemia of chronic disease  Chronic kidney disease III  - Appreciate nephrology efforts.  Follow trend.  Potassium restricted diet.  - Reduced the gabapentin dosages.  History of moderate aortic stenosis  History of LVEF 45%  Severe peripheral arterial disease  Right lower limb chronic arterial ulcer  - Continue with local care.  Appreciated wound care nurse input.  Continue with [facility].    History of COPD    Patient Active Problem List   Diagnosis   . NSTEMI (non-ST elevated myocardial infarction)   . Hypertensive urgency   . PAD (peripheral artery disease)   . Arterial leg ulcer   . Chronic obstructive pulmonary disease, unspecified COPD type   . Ischemic cardiomyopathy   . CAD (coronary artery disease)   . Diabetes   . Chronic kidney disease   . Debilitated       Continue comprehensive  and intensive inpatient rehab  program, including:   Physical therapy 60-120 min daily, 5-6 times per week, Occupational therapy 60-120 min daily, 5-6 times per week, Case management and Rehabilitation nursing    Signed by: [name] MD    [facility]

## 2014-03-17 NOTE — Progress Notes (Signed)
[facility]    History:    -HTN with CKD   -Anemia with CKD  -CKD III, baseline Cr 1.6-1.7 mg/dL  -3 vessels CAD; s/p cath and CABG   -PVD with right foot ulcer  -metabolic acidosis  -hyperkalemia    Assessment/Plan:    Creatinine is up slightly but I'm not sure whether or not this is variation. Will 1) stop Lasix, 2) add an activated vitamin D, 3) increase hydralazine since it needs to be given TID, and 4) add a K restriction.    Scheduled Meds:  Current Facility-Administered Medications   Medication Dose Route Frequency   . amiodarone  200 mg Oral Daily   . amLODIPine  10 mg Oral Daily   . aspirin EC  325 mg Oral Daily   . atorvastatin  10 mg Oral QHS   . cadexomer iodine   Topical Daily   . calcitRIOL  0.25 mcg Oral Daily   . carvedilol  25 mg Oral Q12H SCH   . furosemide  20 mg Oral Daily   . gabapentin  300 mg Oral BID   . heparin (porcine)  5,000 Units Subcutaneous Q12H [facility]   . hydrALAZINE  75 mg Oral TID   . insulin aspart  3 Units Subcutaneous Once   . lidocaine  2 patch Transdermal Q24H   . polyethylene glycol  17 g Oral Daily   . sodium bicarbonate  650 mg Oral BID   . venlafaxine  75 mg Oral BID       Continuous Infusions:     PRN Meds:acetaminophen, albuterol-ipratropium, bisacodyl, dextrose, glucagon (rDNA), insulin aspart, magnesium hydroxide, oxyCODONE-acetaminophen, traMADol    Subjective:    Responsive and comfortable  HEENT: no visual complaint, cough, hearing deficit  Lungs: no cough, SOB  CV: No chest pain, palpitations  Abd: No constipation, pain, N&V  Ext: no pain    Objective:  Vital signs in last 24 hours:  Temp:  [97.9 F (36.6 C)-99 F (37.2 C)] 99 F (37.2 C)  Heart Rate:  [78-85] 85  Resp Rate:  [18-20] 18  BP: (141-162)/(62-67) 162/62 mmHg    Labs:    Recent Labs  Lab 03/17/14  0510 03/15/14  0522 03/14/14  0513   GLUCOSE 114* 113* 103*   BUN 32* 27* 28.0*   CREATININE 1.9* 1.7* 1.6*   CALCIUM 8.6 8.5 8.4*   SODIUM 141 141 141    POTASSIUM 5.2* 5.3* 4.9   CHLORIDE 109 109 109   CO2 24 26 26    ALBUMIN 2.8* 2.8*  --        Recent Labs  Lab 03/17/14  0510 03/15/14  0522   WBC 6.47 5.28   HEMOGLOBIN 8.8* 9.0*   HEMATOCRIT 29.8* 31.0*   MCV 89.5 89.9   MCH, POC 26.4* 26.1*   MCHC 29.5* 29.0*   RDW 22* 22*   MPV 9.7 9.5   PLATELETS 287 275       Weight:  Wt Readings from Last 4 Encounters:   03/14/14 70.852 kg (156 lb 3.2 oz)       Intake/Output:  No intake or output data in the 24 hours ending 03/17/14 1232    Vital Signs:  Patient Vitals for the past 24 hrs:   BP Temp Pulse Resp SpO2   03/17/14 0849 162/62 mmHg - 85 18 95 %   03/17/14 0540 158/67 mmHg 99 F (37.2 C) 84 20 95 %   03/16/14 1952 141/64 mmHg  97.9 F (36.6 C) 78 18 96 %       Physical Exam:    General appearance and mental status - in no distress  Eyes - pupils equal, sclera clear  Neck - supple  Chest - clear to auscultation, no wheezes, rales or rhonchi  Heart - regular rate and rhythm, no murmurs or rubs  Abdomen - soft, nontender, no masses or organomegaly  Extremities - no edema  Skin - no rashes    Verlene Mayer, MD  [facility] [employer].  [phone]

## 2014-03-17 NOTE — Rehab Progress Note ([facility]) (Signed)
[name]  MRN: [id number]  Account: [id number]  Session Start: 03/17/2014 12:00:00 AM  Session Stop: 03/17/2014 12:00:00 AM    Rehabilitation Nursing  Inpatient Rehabilitation Shift Assessment    Rehab Diagnosis: Debility secondary to NSTEMI and multivessel CAD S/P CABG  Demographics:            Age: 63Y            Gender: Female  Primary Language: English    Date of Onset:  02/26/14  Date of Admission: 03/14/2014 6:35:00 PM    Rehabilitation Precautions Restrictions:   Falls, Infection, Sternal precautions    Patient Report: I feel okay  Pain: Patient currently without complaints of pain.  Pain Reassessment: Pain was not reassessed as no pain was reported.  Patient/Caregiver Goals:  To go home and live normally    NEURO  Orientation/Awareness: Alert and Oriented x3.  Speech: Clear.  Behavior: Cooperative.    MEDICATIONS  IV Access: No IV access.  Dialysis Access: Patient does not have dialysis access.      Elopement Risk Level Assessment Tool  Patient Criteria: Patient is not capable of leaving the unit.  Assessment is not  applicable.      RISK ASSESSMENT FOR FALLS/INJURY    MENTAL STATUS CRITERIA:   0 - None identified.  MENTAL STATUS TOTAL: 0    AGE CRITERIA:   77 - < 64 years old  AGE TOTAL: 0    ELIMINATION CRITERIA:   3 - Toileting with Assistance.   ELIMINATION TOTAL: 3    HISTORY OF FALLS CRITERIA:   2 - Unknown History.  HISTORY OF FALLS TOTAL: 2    MEDICATIONS CRITERIA:   2 or more High Risk Medications (*see list below)   MEDICATIONS TOTAL: 2    PHYSICAL MOBILITY CRITERIA:   3 - Decreased balance reaction.   1 - Weakness/impaired physical mobility.   PHYSICAL MOBILITY TOTAL: 4    FALLS RISK ASSESSMENT TOTAL: 11    Patient's Fall Risk: TOTAL SCORE >10: High Risk    Falls Interventions: Clutter removed and clear path to BR.  Call bell, phone, glasses, etc within reach.  Hourly toileting/safety checks between 6am and 10pm, then every 2 hours.  Yellow "high risk" patient identification in place: wrist  band, socks, chart  sticker, door sign.  Pt and family education.  Assistive devices at [facility].  Pharmacy review of meds.    NUTRITION  Diet:  Type: Consistent carbohydrate.  Food Consistency: Regular.  Liquid Consistency: Thin.      CARDIOVASCULAR     Bilateral lower extremities  Nail Bed Color: Pink.   + 1 edema to anterior shine  Pulses:   Apical Pulse: Regular. Strong. Rate is 85 .   Patient does not have a pacemaker.   Patient does not have a defibrillator.    CARDIOPULMONARY  Lung Sounds:   Upper lobes. Clear.   Lower lobes. Clear.  Type of Respirations: Regular.  Cough: No cough noted.  Respiratory Support: The patient does not require any respiratory support.  Respiratory Equipment: 95%, RA.    INTEGUMENTARY  Skin:  Temperature: Warm  Turgor: Normal for age  Moisture: Dry  Color of skin: Normal for Race/Ethnicity  Capillary Refill: Less than 3 seconds  Wounds/Incisions:     Surgical Incision: Sternal incision Length: 17 centimeter(s) with glue. No  signs of infection.  Drainage: Incision without drainage.  Odor:  No  Incision Care: Incision healing     Surgical Incision: Sternal  incision Length: 1 centimeter(s) with glue. No signs  of infection.  Drainage: Incision without drainage.  Odor:  No  Incision Care: Incision healing     Open wound. Right leg Tissue Type: Epithelial Tissue. Tissue Type: Granulation  Tissue. Tissue Type: Slough-  Length: 15 centimeters    Width: 4 centimeters.    Depth: No, secondary to  necrotic tissue.  Open Surface Area:  60 cm  Undermining: No  Tunneling:  No  Peri-Wound Erythema: No  Skin Color:  Dry  skin  Open Wound Care: honey oint.  Braden Scale for Predicting Pressure Sore Risk: Sensory Perception: No  impairment  Moisture: Rarely moist  Activity: Walks occasionally  Mobility: Slightly limited  Nutrition: Adequate  Friction and Shear: No apparent problem  Braden Score: 20  Level of Risk: No risk (19-23). Will reassess every shift.    GASTROINTESTINAL  Abdomen: Soft.  Nontender.  Bowel Sounds:  Active bowel sounds audible in all four quadrants.  Date of Last Bowel Movement:  03/15/14   No Problems/Complaints with Bowel Elimination Assessed.    GENITOURINARY  Current Bladder Pattern: Continent  Color:  Yellow   Patient denies problems with urination and/or catheter.    MUSCULOSKELETAL  Upper Body: Sternal Precautions in place  Lower Body:    Functional Measures    EATING: Eating Score = 7. Patient is completely independent for eating.  There  are no activity limitations.    BLADDER MANAGEMENT - LEVEL OF ASSIST: Bladder Score = 5.  Patient is  supervision/set-up for bladder management, requiring: Setting out equipment.  Emptying equipment.  Patient requires the following assistive device(s): Bedside Commode.    BLADDER ACCIDENTS THIS SHIFT:  0 . Patient has not had an accident this  assessment and did not require medications or devices.    BOWEL MANAGEMENT - LEVEL OF ASSIST: Bowel Score = 6. Patient is modified  independent for bowel management requiring: Stool softeners    BOWEL ACCIDENTS THIS SHIFT: 0 . Patient has not had an accident and did not  require medications or devices.    Education Provided:    Education Provided: Precautions. Pain management. Pain scale. Medication  options. Side effects. Safety issues and interventions. Fall protocol.  Skin/wound care. Signs/symptoms of infection. Role of nutrition in wound  prevention/healing. Critical pressure areas. Prevention of skin breakdown.       Audience: Patient.       Mode: Explanation.       Response: Applied knowledge.  Verbalized understanding.    Discharge: Patient is not being discharged at this time.    Long Term Goals:  1. Patient will be able to demonstrate skill in wound care,  dressing changes and skin monitoring with out queing.   2. Patient will perform wound dressing changes independently.   3. Patient will demonstrate safe transfers from bed to wheelchair, bed to chair  and and bed to bsc using sternal  precautions.   4. Patient will communicate her understandfing of s/s of  wound infection and  when to call the doctor.   5. Patient will demonstrate her understanding of DM and how to monitor BS, adm  insulin, and s/s of hypo and hyperglycemia.  Jan 11th (2 weeks)  Short Term Goals:  1. Patient will be able to call 100 percent of the time for  help when getting out of bed. She will communicate her understanding for  assistance. - Goal Not Met   2. Patient will be able to, at the  end of the week demonstrate good hand  washing techniques, and wound care for lower right extremity with reach bar if  needed.  January 4th, one week    PROGRESS TOWARD GOALS: SHORT TERM GOAL REVIEW:       1. Patient will be able to call 100 percent of the time for help when  getting out of bed. She will communicate her understanding for assistance. - Met         2. Patient will be able to, at the end of the week demonstrate good hand  washing techniques, and wound care for lower right extremity with reach bar if  needed. - Not Met: Teaaching in progress, ablle to perform 50% most of the time  Time frame to achieve short term goal(s):  January 4th, one week   LONG TERM GOAL REVIEW:       1. Patient will be able to demonstrate skill in wound care, dressing  changes and skin monitoring with out queing. - Not Met: Teaching in progress and  able to demostrate care well       2. Patient will perform wound dressing changes independently. - Not Met  Depend 50% on staff at this time  Timeframe to achieve long term goal(s):  Jan 11th (2 weeks)    PLAN: Nursing Specific Interventions  Pain Management. Skin Management. Wound Management.  Continue with the current Nursing Plan of Care.    TEAM CARE PLAN  Identified problems from team documentation:  Problem: Impaired Endocrine/Metabolic Function  Endocrine: Primary Team Goal: Patient will demonstrate understanding of DM  management to include adm of insulin, s/s of hypo/ hyper  glycemia./Active    Problem: Impaired Mobility  Mobility: Primary Team Goal: Patient will perform household functional mobility  mod I using LRAD in order to ensure safe d/c home./Active    Problem: Impaired Self-care Mgmt/ADL/IADL  Self Care: Primary Team Goal: Pt will be Mod I in ADL and functional mobility  and adhere to sternal precautions 100% of the time with no cuing./Active    Problem: Impaired Skin/Wound Mgmt  Skin Wound: Primary Team Goal: Patient and caregiver will be independent of  wound care/Active    Add/Update Problems from this assessment:  No updates at this time.    Please review Integrated Patient View Care Plan Flowsheet for Team identified  Problems, Interventions, and Goals.    Signed by: Coralyn Pear, RN 03/17/2014 2:00:00 PM

## 2014-03-18 LAB — GLUCOSE WHOLE BLOOD - POCT
Whole Blood Glucose POCT: 93 mg/dL (ref 70–100)
Whole Blood Glucose POCT: 118 mg/dL — ABNORMAL HIGH (ref 70–100)
Whole Blood Glucose POCT: 146 mg/dL — ABNORMAL HIGH (ref 70–100)
Whole Blood Glucose POCT: 108 mg/dL — ABNORMAL HIGH (ref 70–100)

## 2014-03-18 LAB — RENAL FUNCTION PANEL
Albumin: 2.9 g/dL — ABNORMAL LOW (ref 3.5–5.0)
Anion Gap: 8 (ref 5.0–15.0)
BUN: 35 mg/dL — ABNORMAL HIGH (ref 7–19)
CO2: 24 mEq/L (ref 22–29)
Calcium: 8.9 mg/dL (ref 8.5–10.5)
Chloride: 107 mEq/L (ref 100–111)
Creatinine: 2 mg/dL — ABNORMAL HIGH (ref 0.6–1.0)
Glucose: 144 mg/dL — ABNORMAL HIGH (ref 70–100)
Phosphorus: 4 mg/dL (ref 2.3–4.7)
Potassium: 5.3 mEq/L — ABNORMAL HIGH (ref 3.5–5.1)
Sodium: 139 mEq/L (ref 136–145)

## 2014-03-18 LAB — GFR: EGFR: 30.3

## 2014-03-18 NOTE — Progress Notes (Signed)
[facility]    History:    -HTN with CKD   -Anemia with CKD  -CKD III, baseline Cr 1.6-1.7 mg/dL  -3 vessels CAD; s/p cath and CABG   -PVD with right foot ulcer  -metabolic acidosis  -hyperkalemia    Assessment/Plan:    BP is better this AM but AM labs are pending. Will check K and watch for a change in the GFR.    Scheduled Meds:  Current Facility-Administered Medications   Medication Dose Route Frequency   . amiodarone  200 mg Oral Daily   . amLODIPine  10 mg Oral Daily   . aspirin EC  325 mg Oral Daily   . atorvastatin  10 mg Oral QHS   . cadexomer iodine   Topical Daily   . calcitRIOL  0.25 mcg Oral Daily   . carvedilol  25 mg Oral Q12H [facility]   . furosemide  20 mg Oral Daily   . gabapentin  300 mg Oral BID   . heparin (porcine)  5,000 Units Subcutaneous Q12H [facility]   . hydrALAZINE  75 mg Oral TID   . insulin aspart  3 Units Subcutaneous Once   . polyethylene glycol  17 g Oral Daily   . sodium bicarbonate  650 mg Oral BID   . venlafaxine  75 mg Oral BID       Continuous Infusions:     PRN Meds:acetaminophen, albuterol-ipratropium, bisacodyl, dextrose, glucagon (rDNA), insulin aspart, magnesium hydroxide, oxyCODONE-acetaminophen, traMADol    Subjective:    Responsive and comfortable  HEENT: no visual complaint, cough, hearing deficit  Lungs: no cough, SOB  CV: No chest pain, palpitations  Abd: No constipation, pain, N&V  Ext: no pain    Objective:  Vital signs in last 24 hours:  Temp:  [98.1 F (36.7 C)-98.8 F (37.1 C)] 98.8 F (37.1 C)  Heart Rate:  [77-78] 78  Resp Rate:  [18] 18  BP: (119-144)/(58-60) 119/58 mmHg    Labs:    Recent Labs  Lab 03/17/14  0510 03/15/14  0522 03/14/14  0513   GLUCOSE 114* 113* 103*   BUN 32* 27* 28.0*   CREATININE 1.9* 1.7* 1.6*   CALCIUM 8.6 8.5 8.4*   SODIUM 141 141 141   POTASSIUM 5.2* 5.3* 4.9   CHLORIDE 109 109 109   CO2 24 26 26    ALBUMIN 2.8* 2.8*  --        Recent Labs  Lab 03/17/14  0510 03/15/14  0522   WBC 6.47 5.28    HEMOGLOBIN 8.8* 9.0*   HEMATOCRIT 29.8* 31.0*   MCV 89.5 89.9   MCH, POC 26.4* 26.1*   MCHC 29.5* 29.0*   RDW 22* 22*   MPV 9.7 9.5   PLATELETS 287 275       Weight:  Wt Readings from Last 4 Encounters:   03/14/14 70.852 kg (156 lb 3.2 oz)       Intake/Output:  No intake or output data in the 24 hours ending 03/18/14 1014    Vital Signs:  Patient Vitals for the past 24 hrs:   BP Temp Pulse Resp SpO2   03/18/14 0608 119/58 mmHg 98.8 F (37.1 C) 78 18 94 %   03/17/14 2032 144/60 mmHg 98.1 F (36.7 C) 77 18 94 %       Physical Exam:    General appearance and mental status - in no distress  Eyes - pupils equal, sclera clear  Neck - supple  Chest - clear to auscultation, no wheezes, rales or rhonchi  Heart - regular rate and rhythm, no murmurs or rubs  Abdomen - soft, nontender, no masses or organomegaly  Extremities - no edema  Skin - no rashes    Royann Shivers, MD  [facility].  [phone]

## 2014-03-18 NOTE — Rehab Progress Note ([facility]) (Signed)
NAMEVENERA [name]  MRN: [id number]  Account: [id number]  Session Start: 03/18/2014 10:15:00 AM  Session Stop: 03/18/2014 11:00:00 AM    Physical Therapy  Inpatient Rehabilitation Progress Note - Brief    Rehab Diagnosis: Debility secondary to NSTEMI and multivessel CAD S/P CABG  Demographics:            Age: 89Y            Gender: Female  Rehabilitation Precautions/Restrictions:   Falls, Infection,    SUBJECTIVE  Patient Report: "I am in a lot of pain. I am coughing and its causing my chest  to hurt."  Pain: Patient currently has pain.  Patient reports a pain level of 8 out of 10. Patient medicated.    OBJECTIVE  General Observations: Pt received supine in bed. vertical sternal incision.  Arterial wound on right foot is covered, so not inspected. Pt is agreeable to PT  at this time. RN present in room passing medications  Vital Signs:                       Current Value                Previous Value  Vitals  BP Systolic          155                          119/58  BP Diastolic         76                           -  Pulse                75                           78  Respirations         -                            18    Interventions:       Therapeutic Activities:  Initially pt was repositioned in bed sencondary to  chest pain. Pt then completed ankle pumps and heel slides during medication  administration. Semireclined to sit EOB with supervision and stand pivot  transfer to [facility] with CG. Therex included 2x30 marches and LAQ with 2.5 lb and  2x15 seated hamstring curls with green theraband. Toilet transfer and clother  managment with supervision 75% of the time and Cg 25% of the time. Pt left in  chair in room with call bell at side and all needs met.  Pain Reassessment:  Response to Pain Intervention: no change in pain  Post Intervention Pain Quality:  Throbbing.  Patient Reports Post Intervention Pain Level of: 8 out of 10  Pain Acceptable: No: continue with pain medication    Education Provided:    Education  Provided: positioning in bed for chest pain .       Audience: Patient.       Mode: Explanation.       Response: Verbalized understanding.    ASSESSMENT  Pt was limited by pain in at the incision during today's session. Pt was  medicated at the start of the hour however pt denied change in pain by the end  of the session.  PLAN  Continued Physical Therapy is recommended.  Recommended Frequency/Duration/Intensity: 1-2 hours per day, 5-6 days per week  for 2 weeks after initial eval on 03/15/14  Activities Contributing Toward Care Plan: bed mobility, transfer training,  aerobic and endurance training, balance activity, strengthening, procurement of  DME, caregiver training, pt education and training on falls prevention, etc.    3 Hour Rule Minutes: 60 minutes of PT treatment this session count towards  intensity and duration of therapy requirement. Patient was not seen for the full  scheduled time of PT treatment this session secondary to:   Pt receiving medications and there was medication issue resulting in increased  time  Will attempt to see patient tomorrow,  as scheduled  Therapy Mode Minutes: Individual: 45 minutes.    Signed by: Clayborne Dana, PT 03/18/2014 11:00:00 AM

## 2014-03-18 NOTE — Rehab Progress Note ([facility]) (Signed)
NAMESIDDA HUMM  MRN: [id number]  Account: [id number]  Session Start: 03/18/2014 12:00:00 AM  Session Stop: 03/18/2014 12:00:00 AM    SEVERE SEPSIS SCREEN  INFECTION:  Patient has no indication of infection.  Negative Sepsis Screen.  If you are unable to assess a system's dysfunction because you do not have labs,  or the labs you have are not current (within 24 hours), call physician and  request and order for the lab tests needed.    Signed by: Jesse Fall, RN 03/18/2014 8:16:00 PM

## 2014-03-18 NOTE — Rehab Progress Note ([facility]) (Signed)
[name]  MRN: [id number]  Account: [id number]  Session Start: 03/18/2014 3:45:00 PM  Session Stop: 03/18/2014 4:05:00 PM    Physical Therapy  Inpatient Rehabilitation Progress Note - Brief    Rehab Diagnosis: Debility secondary to NSTEMI and multivessel CAD S/P CABG  Demographics:            Age: 58Y            Gender: Female  Rehabilitation Precautions/Restrictions:   Falls, Infection,sternal precautions.    SUBJECTIVE  Patient Report: "I think that my R leg is more swollen, I called the nurse to  look at it"  Pain: Patient currently has pain.  Patient reports a pain level of 5 out of 10. Session focused on edema management  for pain relief.  Pt reports comfort at rest, but defers walking at this time  secondary to foot pain with amb see interventions    OBJECTIVE  General Observations: Pt received seated in WC with R LE elevated on seat.  Pt  agreeable to session to make up missed minutes, but does not want to participate  in walking at this time secondary to pain with WB    Interventions:       Manual Therapy:  Pt was assisted from WC> bed with Min A, and then assisted  with bed positioning with B LE elevated. R LE swelling was determined to be  stable in consultation with Marisue Humble, RN.  Retrograde massage was provided to R  LE for 12' while education reagarding sternal precautions was provided.  Pt left  resting comfortably with B LE elevated, nursing to change R foot dressing.  Pain Reassessment: Pain was not reassessed as no pain was reported.    Education Provided:    Education Provided: Precautions.       Audience: Patient.       Mode: Explanation.       Response: Verbalized understanding.    ASSESSMENT  Objective R LE measurements were not completed, but R LE edema appeared to  improve in response to retrograde massage.  Pt was able to able to explain 5/5  sternal precautions to this PT.    PLAN  Continued Physical Therapy is recommended.  Recommended Frequency/Duration/Intensity: 1-2 hours per day, 5-6  days per week  for 2 weeks after initial eval on 03/15/14  Activities Contributing Toward Care Plan: bed mobility, transfer training,  aerobic and endurance training, balance activity, strengthening, procurement of  DME, caregiver training, pt education and training on falls prevention, etc.    3 Hour Rule Minutes: 20 minutes of PT treatment this session count towards  intensity and duration of therapy requirement. Patient was seen for the full  scheduled time of PT treatment this session.  Therapy Mode Minutes: Individual: 20 minutes.    Signed by: Roselyn Bering, DPT, PPS Coordinator 03/18/2014 4:05:00 PM

## 2014-03-18 NOTE — Rehab Progress Note ([facility]) (Signed)
[name]  MRN: [id number]  Account: [id number]  Session Start: 03/18/2014 2:00:00 PM  Session Stop: 03/18/2014 3:00:00 PM    Physical Therapy  Inpatient Rehabilitation Progress Note - Brief    Rehab Diagnosis: Debility secondary to NSTEMI and multivessel CAD S/P CABG  Demographics:            Age: 43Y            Gender: Female  Rehabilitation Precautions/Restrictions:   Falls, Infection,sternal precautions.    SUBJECTIVE  Patient Report: Pt agreeable to PT session this pm.  Pain: Pt stated she only had some tenderness in chest incisional area-did not  rate; RN aware and approved PT session this pm.    OBJECTIVE  General Observations: Pt received supine in bed. vertical sternal incision.  Arterial wound on right foot is covered, so not inspected. Edema present R foot.   Pt is agreeable to PT at this time. RN present in room passing medications  Vital Signs:                       Current Value                Previous Value  Vitals  Time                 1405                         -  Position/Activity    sitting                      -  BP Systolic          132                          155/76  BP Diastolic         58                           -  Pulse                71                           75  Respirations         -                            18    Interventions:       Therapeutic Activities:  Pt taken down to 1st floor gym and performed  NuStep x 15 min on Level 5 with B LEs only for strengthening and stretching of R  DF. Pt also practiced amb on level surface with RW 80' x 1 with SBA. Pt  demonstrates a step to pattern of antalgic gait due to R foot/wound. Pt ret to  wc and demonstrated a toilet transfer via stand pivot transfer and SBA in  bathroom. Pt ret to wc and left sitting up with R LE elevated on chair/pillow  due to edema present R foot. Call button in reach of pt.      Education Provided:    Education Provided: Precautions. Plan of care. Gait.       Audience: Patient.       Mode:  Explanation.  Demonstration.  Response: Verbalized understanding.  Demonstrated skill.  Needs practice.    ASSESSMENT  Pt limited by pain in R foot today and chest incisional pain at times. Pt will  cont to benefit from skilled PT services to progress functional mobility before  [place].    PLAN  Continued Physical Therapy is recommended.  Recommended Frequency/Duration/Intensity: 1-2 hours per day, 5-6 days per week  for 2 weeks after initial eval on 03/15/14  Activities Contributing Toward Care Plan: bed mobility, transfer training,  aerobic and endurance training, balance activity, strengthening, procurement of  DME, caregiver training, pt education and training on falls prevention, etc.    3 Hour Rule Minutes: 60 minutes of PT treatment this session count towards  intensity and duration of therapy requirement. Patient was seen for the full  scheduled time of PT treatment this session.  Therapy Mode Minutes: Individual: 60 minutes.    Signed by: Noralee Stain, PT 03/18/2014 3:00:00 PM

## 2014-03-18 NOTE — Rehab Evaluation ([facility]) (Signed)
[name]  MRN: [id number]  Account: [id number]  Session Start: 03/18/2014 12:00:00 AM  Session Stop: 03/18/2014 12:00:00 AM    Case Management  Inpatient Rehabilitation Initial Assessment    Rehab Diagnosis: Debility secondary to NSTEMI and multivessel CAD S/P CABG  Demographics:            Age: 59Y            Gender: Female    Past Medical History: Diabetes mellitus  Chronic kidney disease  Myocardial infarction  Hypertension  Hyperlipidemia  Peripheral arterial disease 06/28/13 right superficial femoral artery stenosis,  tibial peroneal trunk stenosis, left CVL a stenosis, treated with angiography  and angioplasty.  Coronary artery disease  Left circumflex artery presumed DES., Chronically occluded right coronary artery  stent  Anemia  Arterial leg ulcer 2014, receiving home health  Chronic obstructive pulmonary disease   Past Surgical History  Procedure                                                      Date  Cardiac angiography and angioplasty   06/2013  Colonoscopy                                  Unknown  Tubal ligation                                                  Unknown  History of Present Illness: patient is 41F with h/o DMII, PAD, HTN, CKD, CAD s/p  RCA stent and recent admission to OSH for NSTEMI required ICU admission, she  presented on 12/12 with syncope and elevated troponin, found to have multivessel  CAD,  then transferred transferred from [facility] 12/14 for CV Surgery  consult for CABG. She was on a heparin gtt and her ACEI was placed on hold in  setting of ARF, plavix was held given evaluation for CABG. nitro gtt was started  for hypertensive urgency which was weaned off 12/15 for H/A. Patient also with  severe PAD with chronic RLE arterial ulcer, s/p angioplasty in 06/2013,  clinically improving, but moved 2 weeks ago and has not had wound care since  that time. Wound culture unremarkable from OSH on 12/13.  Prior to hosp patient  had not been taking her Spiriva or Symbicort for  COPD, resumed in hospital. She  is also being treated for asymptomatic bacteriuria and iron deficiency anemia.  She is DMII and her Hgb A1C 7.1 on 12/12.  On 12/18 patient underwent CABG x 2  without   Date of Onset: 02/26/14   Date of Admission: 03/14/2014 6:35:00 PM    Premorbid Functional Level: Patient reported Mobility:   Ambulation Device: Rolling walker Mod I for household distances, has rolling  walker and cane at home  Activities of Daily Living:  Eating: Independent. Grooming: Independent. Bathing  UB: Modified Independent. Bathing LB: Modified Independent. Dressing UB:  Independent. Dressing LB: Modified Independent. Toileting: Modified Independent.  Cognition:  WNL  Communication:  WNL  Swallowing:  WNL  Understanding of Current Condition: I am weak and need rehab so I can go  home  and live normally  Patient/Caregiver Goals:  Patient's functional goals: To go home and live  normally  Home Environment: Patient lives in a home environment. Patient lives with Her  daughter Lendon Colonel. They live in a 1 SH 1 STE in [place]. who is (are) able to  assist patient at discharge. [name] works nights. She has another daughter that  lives local that can help intermittently as well.  Primary Language: English    Demographics: (Source of HX, Pre-Hosp, Marital, Income, Vocation, [place])  Source of History: Patient.  Pre-Hospital Living  Environment: Home.  Marital Status: Widowed.  Income Source:   Dance movement psychotherapist.    Vocational Status: Retired for Age.  Military Status: Patient and/or spouse is not a Cytogeneticist.  Family Contact Information:  Primary Contact: Daughter Nayla Dias, same address, [phone].  Relationship:  Address:  Primary Number:  Secondary Number:  POA/Guardian Information:  Patient does not have a guardian.  Financial Concerns: None discussed at this time. Will continue to follow.    Special Needs: Transportation. Wound care. Chronic R. foot wound PVD/ PAD.  Observed Behaviors:  Cooperative.  Psychosocial  History:   None.  Adjustment to Present Illness:  Patient is coping adequately.   Patient is accepting limitations adequately.   Patient's expectations are realistic.   Patient is motivated.  Patient Perceived Primary Stressors:   Caregiver: Daughter works nights.   Transportation:   Current medical condition: Recent MI, CABG, pt. has CKD 3, needs R. iliac stent  this has been deferred until medical condition improves/ stabilizes.  Primary MD Contact: Primary MD Contacted: Dr. Modena Nunnery [phone]    Home Care/Long Term Care Policy: None.    Interdisciplinary Educational Needs and Learning Preferences:  Education not  assessed/provided this session.    Rehab Potential: Able to participate in an intensive inpatient interdisciplinary  rehabilitation program, Good family/social support, Good premorbid functional  status, Good premorbid medical status, Living in the community premorbidly,  Motivated  Barriers to Progress/Discharge: No potential barriers to progress.    Other/Additional Findings:    Medicare Important Message: The Medicare Important Message letter was not  issued. Pt. has FEP BCBS    Care Plan  Identified problems from team documentation:  Problem: Impaired Endocrine/Metabolic Function  Endocrine: Primary Team Goal: Patient will demonstrate understanding of DM  management to include adm of insulin, s/s of hypo/ hyper glycemia./Active    Problem: Impaired Mobility  Mobility: Primary Team Goal: Patient will perform household functional mobility  mod I using LRAD in order to ensure safe d/c home./Active    Problem: Impaired Self-care Mgmt/ADL/IADL  Self Care: Primary Team Goal: Pt will be Mod I in ADL and functional mobility  and adhere to sternal precautions 100% of the time with no cuing./Active    Problem: Impaired Skin/Wound Mgmt  Skin Wound: Primary Team Goal: Patient and caregiver will be independent of  wound care/Active    Identified problems from this assessment:     No problems identified at this  time.    Please review Integrated Patient View Care Plan Flowsheet for Team identified  Problems, Interventions, and Goals.    Signed by: Antonietta Breach, RN 03/18/2014 11:11:00 AM

## 2014-03-18 NOTE — Progress Notes (Signed)
[facility] Physiatry Attending Brief Note  [x] Discussed with nurse.  [x] No new events    Subjective:  Feels well and is without complaints. Pain is controlled with current meds. No chest pain. No shortness of breath. No other complaints.    Objective:  Vitals: BP 119/58 mmHg  Pulse 78  Temp(Src) 98.8 F (37.1 C) (Oral)  Resp 18  SpO2 94%    Awake and Alert, No acute distress, Resting comfortably, No respiratory distress and No calf tenderness    New labs   Results     Procedure Component Value Units Date/Time    Renal function panel [[id number]]  (Abnormal) Collected:  03/18/14 0950    Specimen Information:  Blood Updated:  03/18/14 1032     Glucose 144 (H) mg/dL      Sodium 409 mEq/L      Potassium 5.3 (H) mEq/L      Chloride 107 mEq/L      CO2 24 mEq/L      BUN 35 (H) mg/dL      CALCIUM 8.9 mg/dL      Creatinine 2.0 (H) mg/dL      Albumin 2.9 (L) g/dL      Phosphorus 4.0 mg/dL      Anion Gap 8.0     GFR [[id number]] Collected:  03/18/14 0950     EGFR 30.3 Updated:  03/18/14 1032    Glucose Whole Blood - POCT [[id number]] Collected:  03/18/14 0612     POCT - Glucose Whole blood 93 mg/dL Updated:  57/84/69 6295    Glucose Whole Blood - POCT [[id number]]  (Abnormal) Collected:  03/17/14 2208     POCT - Glucose Whole blood 117 (H) mg/dL Updated:  01/12/24 3664    Glucose Whole Blood - POCT [[id number]]  (Abnormal) Collected:  03/17/14 1652     POCT - Glucose Whole blood 167 (H) mg/dL Updated:  56/38/75 6433    Glucose Whole Blood - POCT [[id number]]  (Abnormal) Collected:  03/17/14 1159     POCT - Glucose Whole blood 139 (H) mg/dL Updated:  60/63/01 6010            Current Medications:  Current Facility-Administered Medications   Medication Dose Route Frequency   . amiodarone  200 mg Oral Daily   . amLODIPine  10 mg Oral Daily   . aspirin EC  325 mg Oral Daily   . atorvastatin  10 mg Oral QHS   . cadexomer iodine   Topical Daily   . calcitRIOL  0.25 mcg Oral Daily   . carvedilol  25 mg Oral Q12H [facility]   . furosemide  20 mg Oral  Daily   . gabapentin  300 mg Oral BID   . heparin (porcine)  5,000 Units Subcutaneous Q12H [facility]   . hydrALAZINE  75 mg Oral TID   . insulin aspart  3 Units Subcutaneous Once   . polyethylene glycol  17 g Oral Daily   . sodium bicarbonate  650 mg Oral BID   . venlafaxine  75 mg Oral BID       Assessment/Plan: 64 y.o. female with cardiac debility s/p MI s/p CABG     Patient Active Problem List   Diagnosis   . NSTEMI (non-ST elevated myocardial infarction)   . Hypertensive urgency   . PAD (peripheral artery disease)   . Arterial leg ulcer   . Chronic obstructive pulmonary disease, unspecified COPD type   . Ischemic cardiomyopathy   . CAD (coronary artery disease)   .  Diabetes   . Chronic kidney disease   . Debilitated       Continue comprehensive intensive inpatient rehab program. Medically stable.  Continue current management.  Labs reviewed, Nephrology following for management of renal function and lytes.  Wound care for RLE wound.  Continue cardiac medications.  DVT ppx with SQ heparin      Danie Chandler, MD  PM&R

## 2014-03-18 NOTE — Rehab Progress Note ([facility]) (Signed)
[name]  MRN: [id number]  Account: [id number]  Session Start: 03/18/2014 12:00:00 AM  Session Stop: 03/18/2014 12:00:00 AM    SEVERE SEPSIS SCREEN  INFECTION:  Patient has no indication of infection.  Negative Sepsis Screen.  If you are unable to assess a system's dysfunction because you do not have labs,  or the labs you have are not current (within 24 hours), call physician and  request and order for the lab tests needed.    Signed by: Arlyce Dice, RN 03/18/2014 10:50:00 AM

## 2014-03-18 NOTE — Rehab Progress Note ([facility]) (Signed)
[name]  MRN: [id number]  Account: [id number]  Session Start: 03/18/2014 8:00:00 AM  Session Stop: 03/18/2014 9:00:00 AM    Occupational Therapy  Inpatient Rehabilitation Progress Note - Brief    Rehab Diagnosis: Debility secondary to NSTEMI and multivessel CAD S/P CABG  Demographics:            Age: 71Y            Gender: Female  Rehabilitation Precautions/Restrictions:   Falls, Infection,    SUBJECTIVE  Patient Report: "I would love a shower"  Pain: Patient currently without complaints of pain.    OBJECTIVE  General Observation: agreeable to Tx    Interventions:       Self Care/Home Management:  Pt seen for AM ADL routine, pt requires SBA for  all aspects of ADL routine with min verbal cues during LB dressing and transfers  to maintain sternal precautions. Pt then engaged in standing balance tasks, with  focus on weight shifting to R and L.  Pt left at bedside, all needs met.      ASSESSMENT  Pt continues to  be limited by decreased endurance, impacting ability to manage  ADL without set-up assist. Additionally, pt remains challenged by poor  weightshifting to RLE, impacting ability to engage in standing level tasks  without increased UE support.    PLAN  Continued Occupational Therapy is recommended.  Recommended Frequency/Duration/Intensity: 60-120 minutes, 5-6x week for 7-10  days from initial eval on 03/15/14  Continued Activities Contributing Toward Care Plan: ADL/IADL retraining, AE  training, sternal precautions, TherEx, TherAct, d/c planning    3 Hour Rule Minutes: 60 minutes of OT treatment this session count towards  intensity and duration of therapy requirement. Patient was seen for the full  scheduled time of OT treatment this session.  Therapy Mode Minutes: Individual: 60 minutes.    Signed by: Abbott Pao, OTR/L 03/18/2014 9:00:00 AM

## 2014-03-18 NOTE — Rehab Progress Note ([facility]) (Signed)
[name]  MRN: [id number]  Account: [id number]  Session Start: 03/18/2014 12:00:00 AM  Session Stop: 03/18/2014 12:00:00 AM    Rehabilitation Nursing  Inpatient Rehabilitation Shift Assessment    Rehab Diagnosis: Debility secondary to NSTEMI and multivessel CAD S/P CABG  Demographics:            Age: 64Y            Gender: Female  Primary Language: English    Date of Onset:  02/26/14  Date of Admission: 03/14/2014 6:35:00 PM    Rehabilitation Precautions Restrictions:   Falls, Infection,    Patient Report: I'm tired.  Pain: Patient currently has pain.  Location: sternal incision  Type: Acute  Quality: Aching.   itching  Pain Scale: Numeric.  Patient reports a pain level of 3 out of 10.  Patient's acceptable level of pain 3 out of 10.   Pain does not interfere with any activity at this time.  Pain is alleviated by: pain medicine  Pain is exacerbated by: Patient medicated. Percocet PRN administered  Pain Reassessment:  Response to Pain Intervention: Fulll relief reported  Post Intervention Pain Quality:  None.  Patient Reports Post Intervention Pain Level of: 0 out of 10  Pain Acceptable: Yes  Patient/Caregiver Goals:  To go home and live normally    NEURO  Orientation/Awareness: Alert and Oriented x3.  Speech: No deficits noted at this time.  Behavior: Cooperative.    MEDICATIONS  IV Access: No IV access.  Dialysis Access: Patient does not have dialysis access.      Elopement Risk Level Assessment Tool  Patient Criteria: Patient is not capable of leaving the unit.  Assessment is not  applicable.      RISK ASSESSMENT FOR FALLS/INJURY    MENTAL STATUS CRITERIA:   0 - None identified.  MENTAL STATUS TOTAL: 0    AGE CRITERIA:   52 - < 39 years old  AGE TOTAL: 0    ELIMINATION CRITERIA:   3 - Toileting with Assistance.   ELIMINATION TOTAL: 3    HISTORY OF FALLS CRITERIA:   2 - Unknown History.  HISTORY OF FALLS TOTAL: 2    MEDICATIONS CRITERIA:   2 or more High Risk Medications (*see list below)   MEDICATIONS TOTAL:  2    PHYSICAL MOBILITY CRITERIA:   3 - Decreased balance reaction.   1 - Weakness/impaired physical mobility.   PHYSICAL MOBILITY TOTAL: 4    FALLS RISK ASSESSMENT TOTAL: 11    Patient's Fall Risk: TOTAL SCORE >10: High Risk    Falls Interventions: Clutter removed and clear path to BR.  Call bell, phone, glasses, etc within reach.  Hourly toileting/safety checks between 6am and 10pm, then every 2 hours.  Initiate Fall care plan and outcome.  Yellow "high risk" patient identification in place: wrist band, socks, chart  sticker, door sign.  Pt and family education.  Assistive devices at [facility].  Bed alarm    NUTRITION  Diet:  Type: Consistent carbohydrate.  Food Consistency: Regular.  Liquid Consistency: Thin.      CARDIOVASCULAR     Right Lower Extremity  Nail Bed Color: Pink.   + 2 edema to ankle and shin   Left Lower Extremity  Nail Bed Color: Pink.   +1 edema to LLE shin  Pulses:   Apical Pulse: Regular. Strong. Rate is 75 .   Patient does not have a pacemaker.   Patient does not have a defibrillator.  CARDIOPULMONARY  Lung Sounds:   Upper lobes. Clear.   Lower lobes. Clear.  Type of Respirations: Regular.  Cough: No cough noted.  Respiratory Support: The patient does not require any respiratory support.  Respiratory Equipment: None. 94% on RA    INTEGUMENTARY  Skin:  Temperature: Warm  Turgor: Normal for age  Moisture: Dry  Color of skin: Normal for Race/Ethnicity  Capillary Refill: Less than 3 seconds  Wounds/Incisions:     Surgical Incision: Thoracic incision. Length: 17 centimeter(s) with glue. No  signs of infection.  Drainage: Incision without drainage.  Odor:  No  Incision Care: Incision healing     Surgical Incision: Abdominal drain sites x3 incision Length: 1 centimeter(s)  with glue. No signs of infection.  Drainage: Incision without drainage.  Odor:  No  Incision Care: Incision healing     Open wound. Right ankle, medial aspect, extends to upper foot Tissue Type:  Slough- Tissue Type: Epithelial Tissue.  Tissue Type: Necrotic Tissue (Eschar).  Length: 15 centimeters    Width: 4 centimeters.    Depth: No, secondary to  necrotic tissue.  Open Surface Area:  60 cm  Undermining: No  Tunneling:  No  Peri-Wound Erythema: No  Skin Color:  Pink or normal for ethnic group  Open Wound Care: Cleansed with NS, honey ointment applied, covered with gauze,  wrapped with Kerlix then ace bandage  Braden Scale for Predicting Pressure Sore Risk: Sensory Perception: No  impairment  Moisture: Rarely moist  Activity: Walks occasionally  Mobility: Slightly limited  Nutrition: Adequate  Friction and Shear: No apparent problem  Braden Score: 20  Level of Risk: No risk (19-23). Will reassess every shift.    GASTROINTESTINAL  Abdomen: Soft. Nontender.  Bowel Sounds:  Active bowel sounds audible in all four quadrants.  Date of Last Bowel Movement:  03/15/14  Pt refused suppository this shift.  Requeted suppository tonight.   No Problems/Complaints with Bowel Elimination Assessed.    GENITOURINARY  Current Bladder Pattern: Continent  Color:  Yellow   Patient denies problems with urination and/or catheter.    MUSCULOSKELETAL  Upper Body: Sternal Precautions in place  Lower Body:    Functional Measures      BLADDER MANAGEMENT - LEVEL OF ASSIST: Bladder Score = 7. Patient is completely  independent for bladder management. There are no activity limitations.    BLADDER ACCIDENTS THIS SHIFT:  0 . Patient has not had an accident this  assessment and did not require medications or devices.    BOWEL MANAGEMENT - LEVEL OF ASSIST: Bowel Score = 7. Patient is completely  independent for bowel management. There are no activity limitations.    BOWEL ACCIDENTS THIS SHIFT: 0 . Patient has not had an accident and did not  require medications or devices. Refused suppository; requested suppository  tonight.    Education Provided:    Education Provided: Plan of care. Pain management. Pain scale. Medication  options. Side effects. Skin/wound care. Incision care.  Wheelchair mobility.  Medication. Name and dosage. Administration. Purpose. Side Effects.       Audience: Patient.       Mode: Explanation.       Response: Verbalized understanding.    Discharge: Patient is not being discharged at this time.    Long Term Goals:  1. Patient will be able to demonstrate skill in wound care,  dressing changes and skin monitoring with out queing. - Goal Not Met   2. Patient will perform wound dressing changes independently. -  Goal Not Met   3. Patient will demonstrate safe transfers from bed to wheelchair, bed to chair  and and bed to bsc using sternal precautions.   4. Patient will communicate her understandfing of s/s of  wound infection and  when to call the doctor.   5. Patient will demonstrate her understanding of DM and how to monitor BS, adm  insulin, and s/s of hypo and hyperglycemia.  Jan 11th (2 weeks)  Short Term Goals:  1. Patient will be able to call 100 percent of the time for  help when getting out of bed. She will communicate her understanding for  assistance. - Goal Met   2. Patient will be able to, at the end of the week demonstrate good hand  washing techniques, and wound care for lower right extremity with reach bar if  needed. - Goal Not Met  January 4th, one week    PROGRESS TOWARD GOALS: STG:2: Pt directed wound care, but unable to reach wound  to RLE herself    PLAN: Nursing Specific Interventions  Pain Management. Skin Management. Wound Management.  Continue with the current Nursing Plan of Care.    TEAM CARE PLAN  Identified problems from team documentation:  Problem: Impaired Endocrine/Metabolic Function  Endocrine: Primary Team Goal: Patient will demonstrate understanding of DM  management to include adm of insulin, s/s of hypo/ hyper glycemia./Active    Problem: Impaired Mobility  Mobility: Primary Team Goal: Patient will perform household functional mobility  mod I using LRAD in order to ensure safe d/c home./Active    Problem: Impaired Self-care  Mgmt/ADL/IADL  Self Care: Primary Team Goal: Pt will be Mod I in ADL and functional mobility  and adhere to sternal precautions 100% of the time with no cuing./Active    Problem: Impaired Skin/Wound Mgmt  Skin Wound: Primary Team Goal: Patient and caregiver will be independent of  wound care/Active    Add/Update Problems from this assessment:  No updates at this time.    Please review Integrated Patient View Care Plan Flowsheet for Team identified  Problems, Interventions, and Goals.    Signed by: Arlyce Dice, RN 03/18/2014 6:00:00 PM

## 2014-03-19 LAB — CBC AND DIFFERENTIAL
Basophils Absolute Automated: 0.02 10*3/uL (ref 0.00–0.20)
Basophils Automated: 0 %
Eosinophils Absolute Automated: 0.36 10*3/uL (ref 0.00–0.70)
Eosinophils Automated: 7 %
Hematocrit: 29.7 % — ABNORMAL LOW (ref 37.0–47.0)
Hgb: 8.9 g/dL — ABNORMAL LOW (ref 12.0–16.0)
Immature Granulocytes Absolute: 0.01 10*3/uL
Immature Granulocytes: 0 %
Lymphocytes Absolute Automated: 1.26 10*3/uL (ref 0.50–4.40)
Lymphocytes Automated: 24 %
MCH: 26.6 pg — ABNORMAL LOW (ref 28.0–32.0)
MCHC: 30 g/dL — ABNORMAL LOW (ref 32.0–36.0)
MCV: 88.9 fL (ref 80.0–100.0)
MPV: 9.3 fL — ABNORMAL LOW (ref 9.4–12.3)
Monocytes Absolute Automated: 0.46 10*3/uL (ref 0.00–1.20)
Monocytes: 9 %
Neutrophils Absolute: 3.06 10*3/uL (ref 1.80–8.10)
Neutrophils: 59 %
Nucleated RBC: 0 /100 WBC (ref 0–1)
Platelets: 271 10*3/uL (ref 140–400)
RBC: 3.34 10*6/uL — ABNORMAL LOW (ref 4.20–5.40)
RDW: 22 % — ABNORMAL HIGH (ref 12–15)
WBC: 5.16 10*3/uL (ref 3.50–10.80)

## 2014-03-19 LAB — RENAL FUNCTION PANEL
Albumin: 2.8 g/dL — ABNORMAL LOW (ref 3.5–5.0)
Anion Gap: 9 (ref 5.0–15.0)
BUN: 36 mg/dL — ABNORMAL HIGH (ref 7–19)
CO2: 25 mEq/L (ref 22–29)
Calcium: 8.9 mg/dL (ref 8.5–10.5)
Chloride: 109 mEq/L (ref 100–111)
Creatinine: 2.1 mg/dL — ABNORMAL HIGH (ref 0.6–1.0)
Glucose: 99 mg/dL (ref 70–100)
Phosphorus: 3.7 mg/dL (ref 2.3–4.7)
Potassium: 5 mEq/L (ref 3.5–5.1)
Sodium: 143 mEq/L (ref 136–145)

## 2014-03-19 LAB — GLUCOSE WHOLE BLOOD - POCT
Whole Blood Glucose POCT: 115 mg/dL — ABNORMAL HIGH (ref 70–100)
Whole Blood Glucose POCT: 164 mg/dL — ABNORMAL HIGH (ref 70–100)
Whole Blood Glucose POCT: 142 mg/dL — ABNORMAL HIGH (ref 70–100)
Whole Blood Glucose POCT: 133 mg/dL — ABNORMAL HIGH (ref 70–100)
Whole Blood Glucose POCT: 162 mg/dL — ABNORMAL HIGH (ref 70–100)

## 2014-03-19 LAB — GFR: EGFR: 28.6

## 2014-03-19 MED ORDER — FUROSEMIDE 20 MG PO TABS
20.0000 mg | ORAL_TABLET | ORAL | Status: DC
Start: 2014-03-21 — End: 2014-03-19

## 2014-03-19 MED ORDER — SODIUM BICARBONATE 650 MG PO TABS
650.0000 mg | ORAL_TABLET | Freq: Every day | ORAL | Status: DC
Start: 2014-03-20 — End: 2014-03-22
  Administered 2014-03-20 – 2014-03-22 (×3): 650 mg via ORAL
  Filled 2014-03-19 (×3): qty 1

## 2014-03-19 MED ORDER — FUROSEMIDE 20 MG PO TABS
20.0000 mg | ORAL_TABLET | Freq: Every day | ORAL | Status: DC
Start: 2014-03-19 — End: 2014-03-25
  Administered 2014-03-20 – 2014-03-25 (×6): 20 mg via ORAL
  Filled 2014-03-19 (×6): qty 1

## 2014-03-19 MED ORDER — DIPHENHYDRAMINE HCL 25 MG PO CAPS
25.0000 mg | ORAL_CAPSULE | Freq: Two times a day (BID) | ORAL | Status: DC | PRN
Start: 2014-03-19 — End: 2014-03-25
  Administered 2014-03-19 – 2014-03-22 (×2): 25 mg via ORAL
  Filled 2014-03-19 (×2): qty 1

## 2014-03-19 NOTE — Rehab Progress Note ([facility]) (Signed)
[name]  MRN: [id number]  Account: [id number]  Session Start: 03/18/2014 12:00:00 AM  Session Stop: 03/18/2014 12:00:00 AM    Rehabilitation Nursing  Inpatient Rehabilitation Shift Assessment    Rehab Diagnosis: Debility secondary to NSTEMI and multivessel CAD S/P CABG  Demographics:            Age: 64Y            Gender: Female  Primary Language: English    Date of Onset:  02/26/14  Date of Admission: 03/14/2014 6:35:00 PM    Rehabilitation Precautions Restrictions:   Falls, Infection,    Patient Report: "Good"  Pain: Patient currently has pain.  Location: rt leg  Type: Acute  Quality: Aching.  Pain Scale: Numeric.  Patient reports a pain level of 5 out of 10.  Patient's acceptable level of pain 0 out of 10.   Pain does not interfere with any activity at this time.  Pain is alleviated by: pain med  Pain is exacerbated by: movement Patient medicated.  Pain Reassessment:  Response to Pain Intervention: "better"  Post Intervention Pain Quality:  Aching.  Patient Reports Post Intervention Pain Level of: 0 out of 10  Pain Acceptable: Yes  Patient/Caregiver Goals:  To go home and live normally    NEURO  Orientation/Awareness: Alert and Oriented x3.  Speech: Clear.  Behavior: Cooperative.    MEDICATIONS  IV Access: No IV access.  Dialysis Access: Patient does not have dialysis access.      Elopement Risk Level Assessment Tool  Patient Criteria: Patient is not capable of leaving the unit.  Assessment is not  applicable.      RISK ASSESSMENT FOR FALLS/INJURY    MENTAL STATUS CRITERIA:   0 - None identified.  MENTAL STATUS TOTAL: 0    [age] CRITERIA:   52 - 82-6 years old  AGE TOTAL: 1    ELIMINATION CRITERIA:   3 - Toileting with Assistance.   ELIMINATION TOTAL: 3    HISTORY OF FALLS CRITERIA:   2 - Unknown History.  HISTORY OF FALLS TOTAL: 2    MEDICATIONS CRITERIA:   2 or more High Risk Medications (*see list below)   MEDICATIONS TOTAL: 2    PHYSICAL MOBILITY CRITERIA:   3 - Decreased balance reaction.   1 -  Weakness/impaired physical mobility.   PHYSICAL MOBILITY TOTAL: 4    FALLS RISK ASSESSMENT TOTAL: 12    Patient's Fall Risk: TOTAL SCORE >10: High Risk    Falls Interventions: Clutter removed and clear path to BR.  Call bell, phone, glasses, etc within reach.  Hourly toileting/safety checks between 6am and 10pm, then every 2 hours.  Initiate Fall care plan and outcome.  Yellow "high risk" patient identification in place: wrist band, socks, chart  sticker, door sign.  Pt and family education.  Assistive devices at [facility].  Pharmacy review of meds.  Bed alarm    NUTRITION  Diet:  Type: Consistent carbohydrate.  Food Consistency: Regular.  Liquid Consistency: Thin.      CARDIOVASCULAR     Right Lower Extremity  Nail Bed Color: Pink.   swelling to rt LE , S/P surgery.  Pulses:   Apical Pulse: Regular. Strong. Rate is 78 .   Patient does not have a pacemaker.   Patient does not have a defibrillator.    CARDIOPULMONARY  Lung Sounds:   Upper lobes. Clear.   Lower lobes. Clear.  Type of Respirations: Regular.  Cough: No cough noted.  Respiratory  Support: The patient does not require any respiratory support.  Respiratory Equipment: None. 95    INTEGUMENTARY  Skin:  Temperature: Warm  Turgor: Normal for age  Moisture: Dry  Color of skin: Normal for Race/Ethnicity  Capillary Refill: Less than 3 seconds  Wounds/Incisions:     Wound/incision not assessed this shift.  Braden Scale for Predicting Pressure Sore Risk: Sensory Perception: No  impairment  Moisture: Rarely moist  Activity: Walks occasionally  Mobility: Slightly limited  Nutrition: Adequate  Friction and Shear: No apparent problem  Braden Score: 20  Level of Risk: No risk (19-23). Will reassess every shift.    GASTROINTESTINAL  Abdomen: Soft. Nontender.  Bowel Sounds:  Active bowel sounds audible in all four quadrants.  Date of Last Bowel Movement:  03/18/14   No Problems/Complaints with Bowel Elimination Assessed.    GENITOURINARY  Current Bladder Pattern: Continent  Color:   Yellow Clear   Patient denies problems with urination and/or catheter.    MUSCULOSKELETAL  Upper Body: Sternal Precautions in place  Lower Body:    Functional Measures      BLADDER MANAGEMENT - LEVEL OF ASSIST: Bladder Score = 7. Patient is completely  independent for bladder management. There are no activity limitations.    BLADDER ACCIDENTS THIS SHIFT:  0 . Patient has not had an accident this  assessment and did not require medications or devices.    BOWEL MANAGEMENT - LEVEL OF ASSIST: Bowel Score = 6. Patient is modified  independent for bowel management requiring: Stool softeners    BOWEL ACCIDENTS THIS SHIFT: 0 . Patient has not had an accident, but used a  stool softener.    Education Provided:    Education Provided: Precautions. Pain management. Medication options. Side  effects. Nutrition/feeding. Wound healing. Skin/wound care. Critical pressure  areas. Prevention of skin breakdown. Signs/symptoms of infection. Role of  nutrition in wound prevention/healing.       Audience: Patient.       Mode: Explanation.  Demonstration.       Response: Applied knowledge.  Verbalized understanding.    Discharge: Patient is not being discharged at this time.    Long Term Goals:  1. Patient will be able to demonstrate skill in wound care,  dressing changes and skin monitoring with out queing. - Goal Not Met   2. Patient will perform wound dressing changes independently. - Goal Not Met   3. Patient will demonstrate safe transfers from bed to wheelchair, bed to chair  and and bed to bsc using sternal precautions.   4. Patient will communicate her understandfing of s/s of  wound infection and  when to call the doctor.   5. Patient will demonstrate her understanding of DM and how to monitor BS, adm  insulin, and s/s of hypo and hyperglycemia.  Jan 11th (2 weeks)  Short Term Goals:  1. Patient will be able to call 100 percent of the time for  help when getting out of bed. She will communicate her understanding for  assistance.  - Goal Met   2. Patient will be able to, at the end of the week demonstrate good hand  washing techniques, and wound care for lower right extremity with reach bar if  needed. - Goal Not Met  January 4th, one week    PROGRESS TOWARD GOALS: Pt working on goals    PLAN: Nursing Specific Interventions  Pain Management. Skin Management. Wound Management.  Continue with the current Nursing Plan of Care.  TEAM CARE PLAN  Identified problems from team documentation:  Problem: Impaired Endocrine/Metabolic Function  Endocrine: Primary Team Goal: Patient will demonstrate understanding of DM  management to include adm of insulin, s/s of hypo/ hyper glycemia./Active    Problem: Impaired Mobility  Mobility: Primary Team Goal: Patient will perform household functional mobility  mod I using LRAD in order to ensure safe d/c home./Active    Problem: Impaired Self-care Mgmt/ADL/IADL  Self Care: Primary Team Goal: Pt will be Mod I in ADL and functional mobility  and adhere to sternal precautions 100% of the time with no cuing./Active    Problem: Impaired Skin/Wound Mgmt  Skin Wound: Primary Team Goal: Patient and caregiver will be independent of  wound care/Active    Add/Update Problems from this assessment:  No updates at this time.    Please review Integrated Patient View Care Plan Flowsheet for Team identified  Problems, Interventions, and Goals.    Signed by: Jesse Fall, RN 03/18/2014 11:07:00 PM

## 2014-03-19 NOTE — Progress Notes (Signed)
[facility], [employer].  Progress Note    Assessment:    -CKD III, baseline Cr 1.6-1.7 mg/dL  -AKI, hemodynamically mediated  -LE edema  -HTN with CKD   -Anemia with CKD  -3 vessels CAD; s/p cath and CABG   -PVD with right foot ulcer  -metabolic acidosis    Plan:  --Decrease bicarb tabs  -Continue lasix    Dione Housekeeper, MD  Office - [phone]  [employer] [phone]  ++++++++++++++++++++++++++++++++++++++++++++++++++++++++++++++  Subjective:  No new complaints    Medications:  Scheduled Meds:  Current Facility-Administered Medications   Medication Dose Route Frequency   . amiodarone  200 mg Oral Daily   . amLODIPine  10 mg Oral Daily   . aspirin EC  325 mg Oral Daily   . atorvastatin  10 mg Oral QHS   . cadexomer iodine   Topical Daily   . calcitRIOL  0.25 mcg Oral Daily   . carvedilol  25 mg Oral Q12H [facility]   . furosemide  20 mg Oral Daily   . gabapentin  300 mg Oral BID   . heparin (porcine)  5,000 Units Subcutaneous Q12H [facility]   . hydrALAZINE  75 mg Oral TID   . insulin aspart  3 Units Subcutaneous Once   . polyethylene glycol  17 g Oral Daily   . sodium bicarbonate  650 mg Oral BID   . venlafaxine  75 mg Oral BID     Continuous Infusions:   PRN Meds:acetaminophen, albuterol-ipratropium, bisacodyl, dextrose, glucagon (rDNA), insulin aspart, magnesium hydroxide, oxyCODONE-acetaminophen, traMADol    Objective:  Vital signs in last 24 hours:  Temp:  [98.2 F (36.8 C)-99.3 F (37.4 C)] 98.8 F (37.1 C)  Heart Rate:  [68-78] 68  Resp Rate:  [18-20] 20  BP: (132-155)/(58-69) 139/68 mmHg  Intake/Output last 24 hours:  No intake or output data in the 24 hours ending 03/19/14 1207  Intake/Output this shift:       Physical Exam:   Gen: WD WN NAD   CV: S1 S2 N RRR   Chest: CTAB   Ab: ND NT soft no HSM +BS   Ext: 2-3+ LE edema, L>R    Labs:    Recent Labs  Lab 03/19/14  0605 03/18/14  0950 03/17/14  0510   GLUCOSE 99 144* 114*   BUN 36* 35* 32*   CREATININE 2.1* 2.0* 1.9*   CALCIUM 8.9 8.9 8.6    SODIUM 143 139 141   POTASSIUM 5.0 5.3* 5.2*   CHLORIDE 109 107 109   CO2 25 24 24    ALBUMIN 2.8* 2.9* 2.8*   PHOSPHORUS 3.7 4.0  --        Recent Labs  Lab 03/19/14  0605 03/17/14  0510 03/15/14  0522   WBC 5.16 6.47 5.28   HEMOGLOBIN 8.9* 8.8* 9.0*   HEMATOCRIT 29.7* 29.8* 31.0*   MCV 88.9 89.5 89.9   MCH, POC 26.6* 26.4* 26.1*   MCHC 30.0* 29.5* 29.0*   RDW 22* 22* 22*   MPV 9.3* 9.7 9.5   PLATELETS 271 287 275

## 2014-03-19 NOTE — Rehab Progress Note ([facility]) (Signed)
[name]  MRN: [id number]  Account: [id number]  Session Start: 03/19/2014 12:00:00 AM  Session Stop: 03/19/2014 12:00:00 AM    Rehabilitation Nursing  Inpatient Rehabilitation Shift Assessment    Rehab Diagnosis: Debility secondary to NSTEMI and multivessel CAD S/P CABG  Demographics:            Age: 64Y            Gender: Female  Primary Language: English    Date of Onset:  02/26/14  Date of Admission: 03/14/2014 6:35:00 PM    Rehabilitation Precautions Restrictions:   Falls, Infection, sternal    Patient Report: "I am itching like mad.  I need some Benadryl."  Pain: Patient currently without complaints of pain.  Pain Reassessment: Pain was not reassessed as no pain was reported.  Patient/Caregiver Goals:  To go home and live normally    NEURO  Orientation/Awareness: Alert and Oriented x4.  Speech: Clear.  Behavior: Cooperative.    MEDICATIONS  IV Access: No IV access.  Dialysis Access: Patient does not have dialysis access.      Elopement Risk Level Assessment Tool  Patient Criteria: Patient is not capable of leaving the unit.  Assessment is not  applicable.      RISK ASSESSMENT FOR FALLS/INJURY    MENTAL STATUS CRITERIA:   0 - None identified.  MENTAL STATUS TOTAL: 0    AGE CRITERIA:   78 - < 65 years old  AGE TOTAL: 0    ELIMINATION CRITERIA:   3 - Toileting with Assistance.   ELIMINATION TOTAL: 3    HISTORY OF FALLS CRITERIA:   2 - Unknown History.  HISTORY OF FALLS TOTAL: 2    MEDICATIONS CRITERIA:   2 or more High Risk Medications (*see list below)   MEDICATIONS TOTAL: 2    PHYSICAL MOBILITY CRITERIA:   3 - Decreased balance reaction.   1 - Weakness/impaired physical mobility.   PHYSICAL MOBILITY TOTAL: 4    FALLS RISK ASSESSMENT TOTAL: 11    Patient's Fall Risk: TOTAL SCORE >10: High Risk    Falls Interventions: Clutter removed and clear path to BR.  Call bell, phone, glasses, etc within reach.  Hourly toileting/safety checks between 6am and 10pm, then every 2 hours.  Initiate Fall care plan and  outcome.  Yellow "high risk" patient identification in place: wrist band, socks, chart  sticker, door sign.  Pt and family education.  Bed alarm    NUTRITION  Diet:  Type: Consistent carbohydrate.  Food Consistency: Regular.  Liquid Consistency: Thin.      CARDIOVASCULAR     Bilateral lower extremities  Nail Bed Color: Fungus noted on toe nail beds - both feet   +1 + 2 edema noted in RLE  Pulses:   Apical Pulse: Regular. Strong. Rate is 68 .   Patient does not have a pacemaker.   Patient does not have a defibrillator.    CARDIOPULMONARY  Lung Sounds:   Upper lobes. Clear.   Lower lobes. Clear.  Type of Respirations: Regular. Regular.  Cough: No cough noted.  Respiratory Support: The patient does not require any respiratory support.  Respiratory Equipment: None.    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  Dressing changed per wound nurse  instructions.  Wound cleansed with NS.  Medihoney applied to wound.  Foot was  wrapped in cling and loose ace wrap.  Wound was not measured this shift.  Sternal incisions are scabbed over, healing and well approximated .  Braden Scale for Predicting Pressure Sore Risk: Sensory Perception: Slightly  limited  Moisture: Rarely moist  Activity: Walks occasionally  Mobility: Slightly limited  Nutrition: Adequate  Friction and Shear: No apparent problem  Braden Score: 19  Level of Risk: No risk (19-23). Will reassess every shift.    GASTROINTESTINAL  Abdomen: Soft. Nontender.  Bowel Sounds:  Active bowel sounds audible in all four quadrants.  Date of Last Bowel Movement:  1/01   No Problems/Complaints with Bowel Elimination Assessed.    GENITOURINARY  Current Bladder Pattern: Continent  Color:  Yellow   Patient denies problems with urination and/or catheter.    MUSCULOSKELETAL  Upper Body: Sternal  Precautions in place  Lower Body:    Functional Measures      TOILETING: Toileting Score = 5.  Patient is supervision/set-up for toileting,  requiring: Stand by assistance.  Patient requires the following assistive device(s):  Grab bar.    BLADDER MANAGEMENT - LEVEL OF ASSIST: Bladder Score = 7. Patient is completely  independent for bladder management. There are no activity limitations.    BLADDER ACCIDENTS THIS SHIFT:  0 . Patient has not had an accident this  assessment and did not require medications or devices.    BOWEL MANAGEMENT - LEVEL OF ASSIST: Bowel Score = 7.  Patient is completely  independent for bowel management.  Patient did not have bowel movement. No  medication/intervention was provided.    BOWEL ACCIDENTS THIS SHIFT: 0 . Patient has not had an accident and did not  require medications or devices.    TRANSFERS BED/CHAIR/WHEELCHAIR: Bed/chair/wheelchair Transfer Score = 5.  Patient is supervision/set-up for transferring to and from the  bed/chair/wheelchair, requiring: Stand by assistance.  Patient requires the following assistive device(s): Grab bars.    TRANSFER TOILET: Toilet Transfer Score = 5.  Patient is supervision/set-up for  transferring to and from the toilet/commode, requiring: Stand by assistance.  Patient requires the following assistive device(s):  Grab bars.    Education Provided:    Education Provided: Skin/wound care. Signs/symptoms of infection.       Audience: Patient.       Mode: Explanation.       Response: Verbalized understanding.    Discharge: Patient is not being discharged at this time.    Long Term Goals:  1. Patient will be able to demonstrate skill in wound care,  dressing changes and skin monitoring with out queing. - Goal Not Met   2. Patient will perform wound dressing changes independently. - Goal Not Met   3. Patient will demonstrate safe transfers from bed to wheelchair, bed to chair  and and bed to bsc using sternal precautions.   4. Patient will communicate her  understandfing of s/s of  wound infection and  when to call the doctor.   5. Patient will demonstrate her understanding of DM and how to monitor BS, adm  insulin, and s/s of hypo and hyperglycemia.  Jan 11th (2 weeks)  Short Term Goals:  1. Patient will be able to call 100 percent of the time for  help when getting out of bed. She will communicate her understanding for  assistance. - Goal Met   2. Patient will be able to, at the end of the week demonstrate good hand  washing techniques,  and wound care for lower right extremity with reach bar if  needed. - Goal Not Met  January 4th, one week    PROGRESS TOWARD GOALS:   LONG TERM GOAL REVIEW:       5. Patient will demonstrate her understanding of DM and how to monitor BS,  adm insulin, and s/s of hypo and hyperglycemia. - Met  Timeframe to achieve long term goal(s):  Jan 11th (2 weeks)    PLAN: Nursing Specific Interventions  Pain Management. Skin Management. Wound Management.  Continue with the current Nursing Plan of Care.    TEAM CARE PLAN  Identified problems from team documentation:  Problem: Impaired Endocrine/Metabolic Function  Endocrine: Primary Team Goal: Patient will demonstrate understanding of DM  management to include adm of insulin, s/s of hypo/ hyper glycemia./Active    Problem: Impaired Mobility  Mobility: Primary Team Goal: Patient will perform household functional mobility  mod I using LRAD in order to ensure safe d/c home./Active    Problem: Impaired Self-care Mgmt/ADL/IADL  Self Care: Primary Team Goal: Pt will be Mod I in ADL and functional mobility  and adhere to sternal precautions 100% of the time with no cuing./Active    Problem: Impaired Skin/Wound Mgmt  Skin Wound: Primary Team Goal: Patient and caregiver will be independent of  wound care/Active    Add/Update Problems from this assessment:  No updates at this time.    Please review Integrated Patient View Care Plan Flowsheet for Team identified  Problems, Interventions, and  Goals.    Signed by: Ludger Nutting, RN 03/19/2014 4:16:00 PM

## 2014-03-19 NOTE — Rehab Progress Note ([facility]) (Signed)
[name]  MRN: [id number]  Account: [id number]  Session Start: 03/19/2014 12:00:00 AM  Session Stop: 03/19/2014 12:00:00 AM    SEVERE SEPSIS SCREEN  INFECTION:  Patient has no indication of infection.  Negative Sepsis Screen.  If you are unable to assess a system's dysfunction because you do not have labs,  or the labs you have are not current (within 24 hours), call physician and  request and order for the lab tests needed.    Signed by: Ludger Nutting, RN 03/19/2014 8:30:00 AM

## 2014-03-19 NOTE — Rehab Progress Note ([facility]) (Signed)
[name]  MRN: [id number]  Account: [id number]  Session Start: 03/19/2014 1:00:00 PM  Session Stop: 03/19/2014 2:00:00 PM    Occupational Therapy  Inpatient Rehabilitation Progress Note - Brief    Rehab Diagnosis: Debility secondary to NSTEMI and multivessel CAD S/P CABG  Demographics:            Age: 61Y            Gender: Female  Rehabilitation Precautions/Restrictions:   Falls, Infection, sternal    SUBJECTIVE  Pain: Patient currently has pain.  Patient reports a pain level of 5 out of 10. Denies need for intervention    OBJECTIVE  Interventions:       Therapeutic Activities:  OT treatment addressed gross motor transitions and  functional mobility. Pt propelled wheelchair x160ft using bilateral LE to  improve overall conditioning. Practiced sit to stands from progressively lower  mat surface with emphasis on equal weight-bearing through BLE. OT facilitated  equal foot placement and weightbearing through the heel while in standing.  Engaged in right LE active stretch to improve ROM. Practiced taking 8-10 steps  without RW with min A to encourage RLE weight-bearing and improve adherence to  sternal precautions. Engaged in a functional task incorporating static and  dynamic standing x15 minutes. Pt left seated in wheelchair, all needs within  reach, hand off to RN.  Pain Reassessment: Pt reports 5/10 pain, but states that it is tolerable and  does not want pain medication.    Education Provided:    Education Provided: Plan of care. Precautions. Functional transfers.       Audience: Patient.       Mode: Explanation.       Response: Needs reinforcement.    ASSESSMENT  Noted increased tolerance for right LE weight-bearing during gross motor  transitions and static standing this session. Pt benefits from mobility training  without the RW during therapy session to encourage LE weightbearing and  adherence to sternal precautions. Continue OT POC.    PLAN  Continued Occupational Therapy is recommended.  Recommended  Frequency/Duration/Intensity: 60-120 minutes, 5-6x week for 7-10  days from initial eval on 03/15/14  Continued Activities Contributing Toward Care Plan: ADL/IADL retraining, AE  training, sternal precautions, TherEx, TherAct, d/c planning    3 Hour Rule Minutes: 60 minutes of OT treatment this session count towards  intensity and duration of therapy requirement. Patient was seen for the full  scheduled time of OT treatment this session.  Therapy Mode Minutes: Individual: 60 minutes.    Signed by: Fransisco Beau, OTR/L 03/19/2014 2:00:00 PM

## 2014-03-19 NOTE — Progress Notes (Signed)
[facility] Physiat[facility] Attending Brief Note  [x] Discussed with nurse.  [x] No new events    Subjective:  Feels well and is without complaints. Pain is controlled with current meds. No chest pain. No shortness of breath. No other complaints.    Objective:  Vitals: BP 139/68 mmHg  Pulse 68  Temp(Src) 98.8 F (37.1 C) (Oral)  Resp 20  SpO2 98%    Awake and Alert, No acute distress, Resting comfortably, No respiratory distress and No calf tenderness    New labs   Results     Procedure Component Value Units Date/Time    Glucose Whole Blood - POCT [[id number]]  (Abnormal) Collected:  03/19/14 1125     POCT - Glucose Whole blood 164 (H) mg/dL Updated:  40/98/11 9147    Glucose Whole Blood - POCT [[id number]]  (Abnormal) Collected:  03/19/14 0656     POCT - Glucose Whole blood 115 (H) mg/dL Updated:  86/57/84 6962    Renal function panel [[id number]]  (Abnormal) Collected:  03/19/14 0605    Specimen Information:  Blood Updated:  03/19/14 0639     Glucose 99 mg/dL      Sodium 401 mEq/L      Potassium 5.0 mEq/L      Chloride 109 mEq/L      CO2 25 mEq/L      BUN 36 (H) mg/dL      CALCIUM 8.9 mg/dL      Creatinine 2.1 (H) mg/dL      Albumin 2.8 (L) g/dL      Phosphorus 3.7 mg/dL      Anion Gap 9.0     GFR [[id number]] Collected:  03/19/14 0605     EGFR 28.6 Updated:  03/19/14 0639    CBC and differential [[id number]]  (Abnormal) Collected:  03/19/14 0605    Specimen Information:  Blood / Blood Updated:  03/19/14 0613     WBC 5.16 x10 3/uL      Hgb 8.9 (L) g/dL      Hematocrit 56.3 (L) %      Platelets 271 x10 3/uL      RBC 3.34 (L) x10 6/uL      MCV 88.9 fL      MCH 26.6 (L) pg      MCHC 30.0 (L) g/dL      RDW 22 (H) %      MPV 9.3 (L) fL      Neutrophils 59 %      Lymphocytes Automated 24 %      Monocytes 9 %      Eosinophils Automated 7 %      Basophils Automated 0 %      Immature Granulocyte 0 %      Nucleated RBC 0 /100 WBC      Neutrophils Absolute 3.06 x10 3/uL      Abs Lymph Automated 1.26 x10 3/uL      Abs Mono Automated 0.46 x10  3/uL      Abs Eos Automated 0.36 x10 3/uL      Absolute Baso Automated 0.02 x10 3/uL      Absolute Immature Granulocyte 0.01 x10 3/uL     Glucose Whole Blood - POCT [[id number]]  (Abnormal) Collected:  03/18/14 2119     POCT - Glucose Whole blood 108 (H) mg/dL Updated:  51/88/41 6606    Glucose Whole Blood - POCT [[id number]]  (Abnormal) Collected:  03/18/14 1638     POCT - Glucose Whole blood 146 (H) mg/dL Updated:  03/18/14 1703    Glucose Whole Blood - POCT [[id number]]  (Abnormal) Collected:  03/18/14 1137     POCT - Glucose Whole blood 118 (H) mg/dL Updated:  29/56/21 3086            Current Medications:  Current Facility-Administered Medications   Medication Dose Route Frequency   . amiodarone  200 mg Oral Daily   . amLODIPine  10 mg Oral Daily   . aspirin EC  325 mg Oral Daily   . atorvastatin  10 mg Oral QHS   . cadexomer iodine   Topical Daily   . calcitRIOL  0.25 mcg Oral Daily   . carvedilol  25 mg Oral Q12H [facility]   . furosemide  20 mg Oral Daily   . gabapentin  300 mg Oral BID   . heparin (porcine)  5,000 Units Subcutaneous Q12H [facility]   . hydrALAZINE  75 mg Oral TID   . insulin aspart  3 Units Subcutaneous Once   . polyethylene glycol  17 g Oral Daily   . sodium bicarbonate  650 mg Oral BID   . venlafaxine  75 mg Oral BID       Assessment/Plan: 64 y.o. female with cardiac debility s/p MI s/p CABG     Patient Active Problem List   Diagnosis   . NSTEMI (non-ST elevated myocardial infarction)   . Hypertensive urgency   . PAD (peripheral artery disease)   . Arterial leg ulcer   . Chronic obstructive pulmonary disease, unspecified COPD type   . Ischemic cardiomyopathy   . CAD (coronary artery disease)   . Diabetes   . Chronic kidney disease   . Debilitated       Continue comprehensive intensive inpatient rehab program. Medically stable.  Continue current management.  Labs reviewed, Nephrology following for management of renal function and lytes.  Wound care for RLE wound.  Continue cardiac medications.  DVT ppx with  SQ heparin      Danie Chandler, MD  PM&R

## 2014-03-19 NOTE — Rehab Progress Note ([facility]) (Signed)
[name]  MRN: [id number]  Account: [id number]  Session Start: 03/19/2014 9:00:00 AM  Session Stop: 03/19/2014 10:00:00 AM    Physical Therapy  Inpatient Rehabilitation Progress Note - Brief    Rehab Diagnosis: Debility secondary to NSTEMI and multivessel CAD S/P CABG  Demographics:            Age: 28Y            Gender: Female  Rehabilitation Precautions/Restrictions:   Falls, Sternal precautions    SUBJECTIVE  Patient Report: " My foot has been really sore this morning."  Pain: Patient currently has pain.  Patient reports a pain level of 7 out of 10. Pt received pain medication prior  to session.    OBJECTIVE  General Observations: Pt received sitting in w/c putting on lotion.  Vital Signs:                       Current Value                Previous Value  Vitals  Time                 -                            1405  Position/Activity    -                            sitting  BP Systolic          -                            132/58  Pulse                -                            71  Respirations         -                            18    Interventions:       Therapeutic Activities:  Initiated session with pt using Nustep x 10  minutes, legs only, with right lower extremity supported on towels. Massed  practice sit to stand from w/c with sba and pt using cardiac pillow for support.  Attempted to ambulate using RW, however pt was weightbearing too much through  upper extremities secondary to pain in right foot. Performed seated lower  extremity ther ex with marching in place, ankle pumps, hip abdcution.    Pain Reassessment: No change from inital assessment    Education Provided:    Education Provided: Ankle pumps to perform in bed to assist with ROM and  increase circulation .       Audience: Patient.       Mode: Explanation.  Demonstration.       Response: Applied knowledge.  Verbalized understanding.    ASSESSMENT  Session was limited by pain in right lower extremity impacting pt's ability to  ambulate at  this time. Pt received medication prior to session, however did not  have an impact during this session.    PLAN  Continued Physical Therapy is recommended.  Recommended Frequency/Duration/Intensity: 1-2 hours per day,  5-6 days per week  for 2 weeks after initial eval on 03/15/14  Activities Contributing Toward Care Plan: bed mobility, transfer training,  aerobic and endurance training, balance activity, strengthening, procurement of  DME, caregiver training, pt education and training on falls prevention, etc.    3 Hour Rule Minutes: 60 minutes of PT treatment this session count towards  intensity and duration of therapy requirement. Patient was seen for the full  scheduled time of PT treatment this session.  Therapy Mode Minutes: Individual: 60 minutes.    Signed by: Victorino Dike, PT, DPT 03/19/2014 10:00:00 AM

## 2014-03-19 NOTE — Rehab Progress Note ([facility]) (Signed)
[name]  MRN: [id number]  Account: [id number]  Session Start: 03/19/2014 10:00:00 AM  Session Stop: 03/19/2014 11:00:00 AM    Occupational Therapy  Inpatient Rehabilitation Progress Note - Brief    Rehab Diagnosis: Debility secondary to NSTEMI and multivessel CAD S/P CABG  Demographics:            Age: 46Y            Gender: Female  Rehabilitation Precautions/Restrictions:   Falls, Infection, sternal    SUBJECTIVE  Pain: Patient currently has pain.  Patient reports a pain level of 5 out of 10. Pt premedicated for therapy, denies  need for further intervention    OBJECTIVE  Interventions:       Therapeutic Activities:  OT treatment focused on right LE weightbearing and  sternal precautions during gross motor transitions. Engaged in right LE  stretching and weight-bearing activities with OT facilitation for positioning  and prolonged stretch of the ankle. Performed LE HEP seated in wheelchair to  improve LE strength and ROM in preparation for functional mobility tasks.  Practiced car transfer with supervision, OT provided prompting to minimize UE  weightbearing. Pt left seated in wheelchair, all needs within reach, hand off to  RN.  Pain Reassessment: Pt reports constant 5/10 pain, but denies need for  intervention and reports that her pain never goes below a level 5.    Education Provided:    Education Provided: Precautions. Plan of care. Functional transfers. Car  transfers.       Audience: Patient.       Mode: Explanation.       Response: Needs reinforcement.    ASSESSMENT  Pt's progress with functional mobility is limited by decreased tolerance for  right LE weight-bearing in the setting of sternal precautions. Her tolerance for  right ankle stretch and weight-bearing during standing is slowly improving. She  continues to benefit from cues for sternal precautions during transfers.  Continue OT POC.    PLAN  Continued Occupational Therapy is recommended.  Recommended Frequency/Duration/Intensity: 60-120  minutes, 5-6x week for 7-10  days from initial eval on 03/15/14  Continued Activities Contributing Toward Care Plan: ADL/IADL retraining, AE  training, sternal precautions, TherEx, TherAct, d/c planning    3 Hour Rule Minutes: 60 minutes of OT treatment this session count towards  intensity and duration of therapy requirement. Patient was seen for the full  scheduled time of OT treatment this session.  Therapy Mode Minutes: Individual: 60 minutes.    Signed by: Fransisco Beau, OTR/L 03/19/2014 11:00:00 AM

## 2014-03-20 LAB — GLUCOSE WHOLE BLOOD - POCT
Whole Blood Glucose POCT: 125 mg/dL — ABNORMAL HIGH (ref 70–100)
Whole Blood Glucose POCT: 134 mg/dL — ABNORMAL HIGH (ref 70–100)
Whole Blood Glucose POCT: 115 mg/dL — ABNORMAL HIGH (ref 70–100)
Whole Blood Glucose POCT: 154 mg/dL — ABNORMAL HIGH (ref 70–100)

## 2014-03-20 NOTE — Rehab Progress Note ([facility]) (Signed)
[name]  MRN: [id number]  Account: [id number]  Session Start: 03/20/2014 12:00:00 AM  Session Stop: 03/20/2014 12:00:00 AM    Rehabilitation Nursing  Inpatient Rehabilitation Shift Assessment    Rehab Diagnosis: Debility secondary to NSTEMI and multivessel CAD S/P CABG  Demographics:            Age: 64Y            Gender: Female  Primary Language: English    Date of Onset:  02/26/14  Date of Admission: 03/14/2014 6:35:00 PM    Rehabilitation Precautions Restrictions:   Falls, Infection, sternal    Patient Report: " I am cold."  Pain: Patient currently without complaints of pain.  Pain Reassessment: Pain was not reassessed as no pain was reported.  Patient/Caregiver Goals:  To go home and live normally    NEURO  Orientation/Awareness: Alert and Oriented x4.  Speech: No deficits noted at this time.  Behavior: Cooperative.    MEDICATIONS  IV Access: No IV access.  Dialysis Access: Patient does not have dialysis access.      Elopement Risk Level Assessment Tool  Patient Criteria: Patient is not capable of leaving the unit.  Assessment is not  applicable.      RISK ASSESSMENT FOR FALLS/INJURY    MENTAL STATUS CRITERIA:   0 - None identified.  MENTAL STATUS TOTAL: 0    AGE CRITERIA:   59 - < 67 years old  AGE TOTAL: 0    ELIMINATION CRITERIA:   3 - Toileting with Assistance.   ELIMINATION TOTAL: 3    HISTORY OF FALLS CRITERIA:   2 - Unknown History.  HISTORY OF FALLS TOTAL: 2    MEDICATIONS CRITERIA:   2 or more High Risk Medications (*see list below)   MEDICATIONS TOTAL: 2    PHYSICAL MOBILITY CRITERIA:   1 - Weakness/impaired physical mobility.   PHYSICAL MOBILITY TOTAL: 1    FALLS RISK ASSESSMENT TOTAL: 8    Patient's Fall Risk: TOTAL SCORE is <=10:  Low Risk.    Falls Interventions: Clutter removed and clear path to BR.  Hourly toileting/safety checks between 6am and 10pm, then every 2 hours.    NUTRITION  Diet:  Type: Consistent carbohydrate.  Food Consistency: Regular.  Liquid Consistency:  Thin.      CARDIOVASCULAR     Right Lower Extremity  Nail Bed Color: Pink.   +2edema RLE  Pulses:   Apical Pulse: Regular. Strong. Rate is 77 .   Patient does not have a pacemaker.   Patient does not have a defibrillator.    CARDIOPULMONARY  Lung Sounds:   Upper lobes. Clear.   Lower lobes. Clear.  Type of Respirations: Regular.  Cough: No cough noted.  Respiratory Support: The patient does not require any respiratory support.  Respiratory Equipment:    INTEGUMENTARY  Skin:  Temperature: Cool  Turgor: Elastic  Moisture: Dry  Color of skin: Normal for Race/Ethnicity  Capillary Refill: Less than 3 seconds  Wounds/Incisions:     Wound/incision not assessed this shift.   wound odorous on inner R ankle.  Wrapped with ace bandage but had just been  changed.  Braden Scale for Predicting Pressure Sore Risk: Sensory Perception: No  impairment  Moisture: Rarely moist  Activity: Walks occasionally  Mobility: Slightly limited  Nutrition: Adequate  Friction and Shear: No apparent problem  Braden Score: 20  Level of Risk: No risk (19-23). Will reassess every shift.    GASTROINTESTINAL  Abdomen: Soft. Nontender.  Bowel Sounds:  Active bowel sounds audible in all four quadrants.  Date of Last Bowel Movement:  03/18/14   No Problems/Complaints with Bowel Elimination Assessed.    GENITOURINARY  Current Bladder Pattern: Continent  Color:  Yellow   Patient denies problems with urination and/or catheter.    MUSCULOSKELETAL  Upper Body: Sternal Precautions in place  Lower Body: RLE    Functional Measures    EATING: Eating Score = 7. Patient is completely independent for eating.  There  are no activity limitations.    UPPER BODY DRESSING: Upper Body Dressing Score = 5. Patient is  supervision/set-up for upper body dressing, requiring: Setting out upper body  dressing equipment.  Gathering/setting out clothes.  Patient requires the following assistive device(s):  No assistive devices were  required.    LOWER BODY DRESSING: Wearing underwear  or an undergarment was not observed for  this patient. Patient requires total assistance for donning and/or doffing  pants/skirt threading right leg. Patient requires total assistance for donning  and/or doffing pants/skirt threading left leg. Patient requires minimal  assistance for donning and/or doffing pants/skirt over hips and adjusting  fastener. Donning and/or doffing right sock was not observed for this patient.  Patient requires total assistance for donning and/or doffing left sock. Donning  and/or doffing right shoe was not observed for this patient. Donning and/or  doffing left shoe was not observed for this patient. Patient performs 18.75 -  24% of lower body dressing tasks. Lower Body Dressing  Score = 1, Total  Assistance.  Patient requires the following assistive device(s): No assistive devices were  required.    TRANSFERS BED/CHAIR/WHEELCHAIR: Bed/chair/wheelchair Transfer Score = 5.  Patient is supervision/set-up for transferring to and from the  bed/chair/wheelchair, requiring: Set up (positioning equipment, lock brakes  and/or adjust foot rest).  Patient requires the following assistive device(s): Grab bars.  Seating system of wheelchair.    Education Provided:    Education Provided: No education provided this session.    Discharge: Patient is not being discharged at this time.    Long Term Goals:  1. Patient will be able to demonstrate skill in wound care,  dressing changes and skin monitoring with out queing. - Goal Not Met   2. Patient will perform wound dressing changes independently. - Goal Not Met   3. Patient will demonstrate safe transfers from bed to wheelchair, bed to chair  and and bed to bsc using sternal precautions.   4. Patient will communicate her understandfing of s/s of  wound infection and  when to call the doctor.   5. Patient will demonstrate her understanding of DM and how to monitor BS, adm  insulin, and s/s of hypo and hyperglycemia. - Goal Met  Jan 11th (2 weeks)  Short  Term Goals:  1. Patient will be able to call 100 percent of the time for  help when getting out of bed. She will communicate her understanding for  assistance. - Goal Met   2. Patient will be able to, at the end of the week demonstrate good hand  washing techniques, and wound care for lower right extremity with reach bar if  needed. - Goal Not Met  January 4th, one week    PROGRESS TOWARD GOALS: Continues on path to goals    PLAN: Nursing Specific Interventions  Pain Management. Skin Management. Wound Management.  Continue with the current Nursing Plan of Care.    TEAM CARE PLAN  Identified problems from team documentation:  Problem: Impaired Endocrine/Metabolic Function  Endocrine: Primary Team Goal: Patient will demonstrate understanding of DM  management to include adm of insulin, s/s of hypo/ hyper glycemia./Active    Problem: Impaired Mobility  Mobility: Primary Team Goal: Patient will perform household functional mobility  mod I using LRAD in order to ensure safe d/c home./Active    Problem: Impaired Self-care Mgmt/ADL/IADL  Self Care: Primary Team Goal: Pt will be Mod I in ADL and functional mobility  and adhere to sternal precautions 100% of the time with no cuing./Active    Problem: Impaired Skin/Wound Mgmt  Skin Wound: Primary Team Goal: Patient and caregiver will be independent of  wound care/Active    Add/Update Problems from this assessment:  No updates at this time.    Please review Integrated Patient View Care Plan Flowsheet for Team identified  Problems, Interventions, and Goals.    Signed by: Dolores Hoose, RN 03/20/2014 1:05:00 AM

## 2014-03-20 NOTE — Rehab Progress Note ([facility]) (Addendum)
Corrected 03/20/2014 11:05:06 AM    NAME: [name]  MRN: [id number]  Account: [id number]  Session Start: 03/20/2014 12:00:00 AM  Session Stop: 03/20/2014 12:00:00 AM    Rehabilitation Nursing  Inpatient Rehabilitation Shift Assessment      SEVERE SEPSIS SCREEN  INFECTION:  Patient has no indication of infection.  Negative Sepsis Screen.  If you are unable to assess a system's dysfunction because you do not have labs,  or the labs you have are not current (within 24 hours), call physician and  request and order for the lab tests needed.  Rehab Diagnosis: Debility secondary to NSTEMI and multivessel CAD S/P CABG  Demographics:            Age: 64Y            Gender: Female  Primary Language: English    Date of Onset:  02/26/14  Date of Admission: 03/14/2014 6:35:00 PM    Rehabilitation Precautions Restrictions:   Falls, Infection, sternal    Patient Report: "My right leg is swollen."  Pain: Patient currently without complaints of pain.  Pain Reassessment: Pain was not reassessed as no pain was reported.  Patient/Caregiver Goals:  To go home and live normally    NEURO  Orientation/Awareness: Alert and Oriented x4.  Speech: Clear.  Verbal.  Behavior: Cooperative.    MEDICATIONS  IV Access: No IV access.  Dialysis Access: Patient does not have dialysis access.      Elopement Risk Level Assessment Tool  Patient Criteria: Patient is not capable of leaving the unit.  Assessment is not  applicable.      RISK ASSESSMENT FOR FALLS/INJURY    MENTAL STATUS CRITERIA:   0 - None identified.  MENTAL STATUS TOTAL: 0    AGE CRITERIA:   21 - < 8 years old  AGE TOTAL: 0    ELIMINATION CRITERIA:   3 - Toileting with Assistance.   ELIMINATION TOTAL: 3    HISTORY OF FALLS CRITERIA:   2 - Unknown History.  HISTORY OF FALLS TOTAL: 2    MEDICATIONS CRITERIA:   2 or more High Risk Medications (*see list below)   2 or more medications (*see list below)   MEDICATIONS TOTAL: 2    PHYSICAL MOBILITY CRITERIA:   3 - Decreased balance reaction.   1 -  Weakness/impaired physical mobility.   PHYSICAL MOBILITY TOTAL: 4    FALLS RISK ASSESSMENT TOTAL: 11    Patient's Fall Risk: TOTAL SCORE >10: High Risk    Falls Interventions: Clutter removed and clear path to BR.  Call bell, phone, glasses, etc within reach.  Hourly toileting/safety checks between 6am and 10pm, then every 2 hours.  Initiate Fall care plan and outcome.  Yellow "high risk" patient identification in place: wrist band, socks, chart  sticker, door sign.  Pt and family education.  Assistive devices at [facility].    NUTRITION  Diet:  Type: Consistent carbohydrate.  Food Consistency: Regular.  Liquid Consistency: Thin.      CARDIOVASCULAR     Right Lower Extremity  Nail Bed Color: Pink.   Patient is encouraged to elevate extremity on pillow when in bed.   Left Lower Extremity  Nail Bed Color: Pink.   No edema or redness present.  Pulses:   Apical Pulse: Regular. Strong. Rate is 70 .   Patient does not have a pacemaker.   Patient does not have a defibrillator.    CARDIOPULMONARY  Lung Sounds:   Upper lobes. Clear.  Lower lobes. Clear.  Type of Respirations: Regular.  Cough: No cough noted.  Respiratory Support: The patient does not require any respiratory support.  Respiratory Equipment: None.    INTEGUMENTARY  Skin:  Temperature: Cool  Turgor: Normal for age  Moisture: Dry  Color of skin: Normal for Race/Ethnicity  Capillary Refill: Less than 3 seconds  Wounds/Incisions:       Diabetic ulcer on right ankle and foot. Dressing changed as ordered. Sternal  incision CDI, open to air, healing.  Braden Scale for Predicting Pressure Sore Risk: Sensory Perception: Slightly  limited  Moisture: Rarely moist  Activity: Chairfast  Mobility: Slightly limited  Nutrition: Adequate  Friction and Shear: No apparent problem  Braden Score: 18  Level of Risk: At risk (15-18)   Assist patient to the bathroom every 2 hours    GASTROINTESTINAL  Abdomen: Soft. Nontender.  Bowel Sounds:  Active bowel sounds audible in all four  quadrants.  Date of Last Bowel Movement:  03/19/2014   No Problems/Complaints with Bowel Elimination Assessed.    GENITOURINARY  Current Bladder Pattern: Continent  Color:  Yellow   Patient denies problems with urination and/or catheter.    MUSCULOSKELETAL  Upper Body: sternal precaution, no blood draw, PICC lines, IV on left upper  extremity  Lower Body: diabetic ulcer on Rt. ankle and foot    Functional Measures      TOILETING: Toileting Score = 5.  Patient is supervision/set-up for toileting,  requiring: Stand by assistance.  Patient requires the following assistive device(s):  Grab bar.    BLADDER MANAGEMENT - LEVEL OF ASSIST: Bladder Score = 7. Patient is completely  independent for bladder management. There are no activity limitations.    BLADDER ACCIDENTS THIS SHIFT:  0 . Patient has not had an accident this  assessment and did not require medications or devices.    BOWEL MANAGEMENT - LEVEL OF ASSIST: Bowel Score = 7.  Patient is completely  independent for bowel management.  Patient did not have bowel movement. No  medication/intervention was provided.    BOWEL ACCIDENTS THIS SHIFT: 0 . Patient has not had an accident and did not  require medications or devices.    TRANSFERS BED/CHAIR/WHEELCHAIR: Bed/chair/wheelchair Transfer Score = 4.  Patient performs 75% or more of effort and minimal assistance (little/incidental  help/lifting of one limb/steadying) for transferring to and from the  bed/chair/wheelchair, requiring: Contact guard.  Patient requires the following assistive device(s): Elevated bed/surface.  Seating system of wheelchair.    TRANSFER TOILET: Toilet Transfer Score = 4.  Patient performs 75% or more of  effort and minimal assistance (little/incidental help/steadying) for  transferring to and from the toilet/commode, requiring: Contact guard.  Patient requires the following assistive device(s):  Grab bars.    Education Provided:    Education Provided: Precautions. Pain management. Pain scale.  Medication  options. Safety issues and interventions. Supervision requirements. Safety.  Medication. Name and dosage. Administration. Purpose. Elevate RLE on pillow when  in bed to help decrease edema. .       Audience: Patient.       Mode: Explanation.       Response: Verbalized understanding.    Discharge: Patient is not being discharged at this time.    Long Term Goals:  1. Patient will be able to demonstrate skill in wound care,  dressing changes and skin monitoring with out queing. - Goal Not Met   2. Patient will perform wound dressing changes independently. - Goal Not Met  3. Patient will demonstrate safe transfers from bed to wheelchair, bed to chair  and and bed to bsc using sternal precautions.   4. Patient will communicate her understandfing of s/s of  wound infection and  when to call the doctor.   5. Patient will demonstrate her understanding of DM and how to monitor BS, adm  insulin, and s/s of hypo and hyperglycemia. - Goal Met  Jan 11th (2 weeks)  Short Term Goals:  1. Patient will be able to call 100 percent of the time for  help when getting out of bed. She will communicate her understanding for  assistance. - Goal Met   2. Patient will be able to, at the end of the week demonstrate good hand  washing techniques, and wound care for lower right extremity with reach bar if  needed. - Goal Not Met  January 4th, one week    PROGRESS TOWARD GOALS: goals are in progress    PLAN: Nursing Specific Interventions  Pain Management. Skin Management. Wound Management.  Continue with the current Nursing Plan of Care.    TEAM CARE PLAN  Identified problems from team documentation:  Problem: Impaired Endocrine/Metabolic Function  Endocrine: Primary Team Goal: Patient will demonstrate understanding of DM  management to include adm of insulin, s/s of hypo/ hyper glycemia./Active    Problem: Impaired Mobility  Mobility: Primary Team Goal: Patient will perform household functional mobility  mod I using LRAD in order  to ensure safe d/c home./Active    Problem: Impaired Self-care Mgmt/ADL/IADL  Self Care: Primary Team Goal: Pt will be Mod I in ADL and functional mobility  and adhere to sternal precautions 100% of the time with no cuing./Active    Problem: Impaired Skin/Wound Mgmt  Skin Wound: Primary Team Goal: Patient and caregiver will be independent of  wound care/Active    Add/Update Problems from this assessment:  No updates at this time.    Please review Integrated Patient View Care Plan Flowsheet for Team identified  Problems, Interventions, and Goals.    Signed by: Shelly Bombard, RN 03/20/2014 10:00:00 AM

## 2014-03-20 NOTE — Progress Notes (Signed)
[facility] Physiatry Attending Brief Note  [x] Discussed with nurse.  [x] No new events    Subjective:  Feels well and is without complaints. Pain is controlled with current meds. No chest pain. No shortness of breath. No other complaints.    Objective:  Vitals: BP 142/59 mmHg  Pulse 70  Temp(Src) 99.5 F (37.5 C) (Oral)  Resp 18  SpO2 95%    Awake and Alert, No acute distress, Resting comfortably, No respiratory distress and No calf tenderness  RLE Edema    New labs   Results     Procedure Component Value Units Date/Time    Glucose Whole Blood - POCT [[id number]]  (Abnormal) Collected:  03/20/14 0528     POCT - Glucose Whole blood 125 (H) mg/dL Updated:  46/96/29 5284    Glucose Whole Blood - POCT [[id number]]  (Abnormal) Collected:  03/19/14 2252     POCT - Glucose Whole blood 162 (H) mg/dL Updated:  72/53/66 4403    Glucose Whole Blood - POCT [[id number]]  (Abnormal) Collected:  03/19/14 2119     POCT - Glucose Whole blood 133 (H) mg/dL Updated:  87/56/43 3295    Glucose Whole Blood - POCT [[id number]]  (Abnormal) Collected:  03/19/14 1625     POCT - Glucose Whole blood 142 (H) mg/dL Updated:  30/16/01 0932            Current Medications:  Current Facility-Administered Medications   Medication Dose Route Frequency   . amiodarone  200 mg Oral Daily   . amLODIPine  10 mg Oral Daily   . aspirin EC  325 mg Oral Daily   . atorvastatin  10 mg Oral QHS   . calcitRIOL  0.25 mcg Oral Daily   . carvedilol  25 mg Oral Q12H SCH   . furosemide  20 mg Oral Daily   . gabapentin  300 mg Oral BID   . heparin (porcine)  5,000 Units Subcutaneous Q12H [facility]   . hydrALAZINE  75 mg Oral TID   . insulin aspart  3 Units Subcutaneous Once   . polyethylene glycol  17 g Oral Daily   . sodium bicarbonate  650 mg Oral Daily   . venlafaxine  75 mg Oral BID       Assessment/Plan: 64 y.o. female with cardiac debility s/p MI s/p CABG     Patient Active Problem List   Diagnosis   . NSTEMI (non-ST elevated myocardial infarction)   . Hypertensive urgency   .  PAD (peripheral artery disease)   . Arterial leg ulcer   . Chronic obstructive pulmonary disease, unspecified COPD type   . Ischemic cardiomyopathy   . CAD (coronary artery disease)   . Diabetes   . Chronic kidney disease   . Debilitated       Continue comprehensive intensive inpatient rehab program. Medically stable.  Continue current management.  Labs reviewed, Nephrology following for management of renal function and lytes.  Wound care for RLE wound.  Continue cardiac medications.  DVT ppx with SQ heparin      Danie Chandler, MD  PM&R

## 2014-03-21 LAB — GLUCOSE WHOLE BLOOD - POCT
Whole Blood Glucose POCT: 100 mg/dL (ref 70–100)
Whole Blood Glucose POCT: 158 mg/dL — ABNORMAL HIGH (ref 70–100)
Whole Blood Glucose POCT: 149 mg/dL — ABNORMAL HIGH (ref 70–100)
Whole Blood Glucose POCT: 160 mg/dL — ABNORMAL HIGH (ref 70–100)

## 2014-03-21 MED ORDER — DARBEPOETIN ALFA 60 MCG/0.3ML IJ SOSY
60.0000 ug | PREFILLED_SYRINGE | Freq: Once | INTRAMUSCULAR | Status: AC
Start: 2014-03-21 — End: 2014-03-21
  Administered 2014-03-21: 60 ug via SUBCUTANEOUS
  Filled 2014-03-21: qty 0.3

## 2014-03-21 NOTE — Rehab Progress Note ([facility]) (Signed)
NAMEJAIYANA [name]  MRN: [id number]  Account: [id number]  Session Start: 03/21/2014 12:00:00 AM  Session Stop: 03/21/2014 12:00:00 AM    SEVERE SEPSIS SCREEN  INFECTION:  Patient has no indication of infection.  Negative Sepsis Screen.  If you are unable to assess a system's dysfunction because you do not have labs,  or the labs you have are not current (within 24 hours), call physician and  request and order for the lab tests needed.    Signed by: Ginger Carne, RN 03/21/2014 8:30:00 AM

## 2014-03-21 NOTE — Progress Notes (Signed)
PHYSICAL MEDICINE AND REHABILITATION  PROGRESS NOTE    Date Time: 03/21/2014 5:49 PM  Patient Name: [name], [name]    Admission date:  03/14/2014      Subjective:   The patient still has achiness in her sternum.  She is taking the oxycodone as needed for pain.  No new issues with bowel or bladder.  She is taking stool softeners.  She is able to sleep.   Functional Status:     PT:  Therapeutic Activities: Pt ambulated in room, therapy gym multiple  repetitions with SBA/occasional manual facilitation up to 61' with RW. Antalgic  gait pattern with step-to, unable to achieve tibial translation over R foot due  to limited R DF ROM. Compensatory hip flex during stance and significant decr  stance time on R LE.  Pt performed SPT without AD with close SBA. Bed mobility using long sit  technique with SBA, multiple repetitions. No cuing required for sternal  precautions throughout session though educated on safety; pt verbalized  understanding.  Pt performed seated partial abdominal crunches for core strengthening, R foot  heel slides and AROM within available range ankle pumps/circles/alphabet to incr  ROM.     OT:  GROOMING: Grooming Score = 7. Patient is completely independent for grooming.  There are no activity limitations.  BATHING: Patient bathed in shower. Bathing Score = 5. Patient is  supervision/set-up for bathing, requiring: Standing by.  Patient requires the following assistive device(s): Hand held shower.  UPPER BODY DRESSING: Upper Body Dressing Score = 6 Patient is modified  independent for upper body dressing, requiring: wheelchair for clothing  retrieval  LOWER BODY DRESSING: Lower Body Dressing Score = 5. Patient is  supervision/set-up for lower body dressing, requiring: Verbal cuing, prompting,  or instructing.  Patient requires the following assistive device(s): No assistive devices were  required.  TOILETING: Toileting Score = 5. Patient is supervision/set-up for toileting,  requiring: Verbal cuing,  prompting, or instructing.  Patient requires the following assistive device(s): Grab bar.  TRANSFER TOILET: Toilet Transfer Score = 5. Patient is supervision/set-up for  transferring to and from the toilet/commode, requiring: Verbal cuing, prompting,  or instructing.  Patient requires the following assistive device(s): Grab bars.  TRANSFER SHOWER: Shower Transfer Score = 5. Patient is supervision/set-up for  transferring to and from the shower, requiring: Verbal cuing, prompting, or  instructing. Shower chair.    Medications:   Medication reviewed by me:     Scheduled Meds: PRN Meds:        amiodarone 200 mg Oral Daily   amLODIPine 10 mg Oral Daily   aspirin EC 325 mg Oral Daily   atorvastatin 10 mg Oral QHS   calcitRIOL 0.25 mcg Oral Daily   carvedilol 25 mg Oral Q12H SCH   furosemide 20 mg Oral Daily   gabapentin 300 mg Oral BID   heparin (porcine) 5,000 Units Subcutaneous Q12H SCH   hydrALAZINE 75 mg Oral TID   insulin aspart 3 Units Subcutaneous Once   polyethylene glycol 17 g Oral Daily   sodium bicarbonate 650 mg Oral Daily   venlafaxine 75 mg Oral BID       Continuous Infusions:      acetaminophen 650 mg Q6H PRN   albuterol-ipratropium 3 mL Q2H PRN   bisacodyl 10 mg QD PRN   dextrose 25 mL PRN   diphenhydrAMINE 25 mg BID PRN   glucagon (rDNA) 1 mg PRN   insulin aspart 1-8 Units PRN   magnesium hydroxide 10 mL  Q6H PRN   oxyCODONE-acetaminophen 1 tablet Q4H PRN   traMADol 50 mg Q6H PRN           Review of Systems:   A comprehensive review of systems was: No fevers, chills, nausea, vomiting, chest pain, shortness of breath, cough, headache, double vision.  All others negative.    Physical Exam:     Filed Vitals:    03/20/14 1408 03/20/14 1954 03/21/14 0546 03/21/14 1343   BP: 139/67 142/54 155/63 143/70   Pulse: 74 81 79 73   Temp:  98.6 F (37 C) 98.1 F (36.7 C)    TempSrc:       Resp:  18 18    SpO2:  94% 94%        Intake and Output Summary (Last 24 hours) at Date Time  No intake or output data in the 24  hours ending 03/21/14 1749                    Cardiac: regular rhythm  Chest / Lungs:  Clear to auscultation.  Abdomen:  + bowel sounds, Soft, non-tender, non-distended.  Extremities: no calf tenderness.  Right dorsal foot is dressed.  Right ankle is with 1+ edema.    Labs:   No results for input(s): GLUCOSEWHOLE in the last 24 hours.      Recent Labs  Lab 03/19/14  0605 03/17/14  0510 03/15/14  0522   WBC 5.16 6.47 5.28   HEMOGLOBIN 8.9* 8.8* 9.0*   HEMATOCRIT 29.7* 29.8* 31.0*   PLATELETS 271 287 275          Recent Labs  Lab 03/19/14  0605 03/18/14  0950 03/17/14  0510 03/15/14  0522   SODIUM 143 139 141 141   POTASSIUM 5.0 5.3* 5.2* 5.3*   CHLORIDE 109 107 109 109   CO2 25 24 24 26    BUN 36* 35* 32* 27*   CREATININE 2.1* 2.0* 1.9* 1.7*   CALCIUM 8.9 8.9 8.6 8.5   ALBUMIN 2.8* 2.9* 2.8* 2.8*   PROTEIN, TOTAL  --   --  5.8* 5.8*   BILIRUBIN, TOTAL  --   --  0.2 0.2   ALKALINE PHOSPHATASE  --   --  78 73   ALT  --   --  13 12   AST (SGOT)  --   --  17 13   GLUCOSE 99 144* 114* 113*   PHOSPHORUS 3.7 4.0  --   --        No results for input(s): INR, APTT in the last 168 hours.    Results     Procedure Component Value Units Date/Time    Glucose Whole Blood - POCT [[id number]]  (Abnormal) Collected:  03/21/14 1643     POCT - Glucose Whole blood 149 (H) mg/dL Updated:  11/91/47 8295    Glucose Whole Blood - POCT [[id number]]  (Abnormal) Collected:  03/21/14 1104     POCT - Glucose Whole blood 158 (H) mg/dL Updated:  84/69/62 9528    Glucose Whole Blood - POCT [[id number]] Collected:  03/21/14 0548     POCT - Glucose Whole blood 100 mg/dL Updated:  27/25/36 6440    Glucose Whole Blood - POCT [[id number]]  (Abnormal) Collected:  03/20/14 1957     POCT - Glucose Whole blood 154 (H) mg/dL Updated:  38/75/64 3329               Rads:   Radiological Procedure reviewed.  Radiology Results (24 Hour)     ** No results found for the last 24 hours. **              Assessment and Plan:     Ambulation and activities of daily living  dysfunction due to cardiac debility  Status post MI  Status post coronary artery bypass graft with multivessel coronary artery disease  - Progressing steadily with PT and OT.  - Estimated length of stay is about 1-2 weeks.  - Continue with current pain medications.    Blood loss anemia  - Has been stable.  Follow as needed.  History of anemia of chronic disease  - Follow iron studies  Chronic kidney disease III  - Some increase in creatinine.  Appreciate nephrology input.  - Continue with Lasix  Diabetes mellitus  - Stable blood glucose.  Follow with current diet.  History of moderate aortic stenosis  History of LVEF 45%  Severe peripheral arterial disease  Right lower limb chronic arterial ulcer  - Continue with local care.  Appreciated wound care nurse input.  Continue with [facility].    - Eventual outpatient followup with vascular surgery  History of COPD    Patient Active Problem List   Diagnosis   . NSTEMI (non-ST elevated myocardial infarction)   . Hypertensive urgency   . PAD (peripheral artery disease)   . Arterial leg ulcer   . Chronic obstructive pulmonary disease, unspecified COPD type   . Ischemic cardiomyopathy   . CAD (coronary artery disease)   . Diabetes   . Chronic kidney disease   . Debilitated       Continue comprehensive and intensive inpatient rehab program, including:   Physical therapy 60-120 min daily, 5-6 times per week, Occupational therapy 60-120 min daily, 5-6 times per week, Case management and Rehabilitation nursing    Signed by: [name] MD    [facility]

## 2014-03-21 NOTE — Progress Notes (Signed)
[facility] [employer].  Progress Note    Assessment:    -CKD III, baseline Cr 1.6-1.7 mg/dL  -AKI, hemodynamically mediated  -LE edema  -HTN with CKD   -Anemia with CKD  -3 vessels CAD; s/p cath and CABG   -PVD with right foot ulcer  -metabolic acidosis    Plan:  -Continue lasix  -Sodium bicarbonate dose decreased on 1/2  -Labs q TTS - will add iron studies  -Dose of Aranesp 60 mcg SQ    Dione Housekeeper, MD  Office - [phone]  [employer] [phone]  ++++++++++++++++++++++++++++++++++++++++++++++++++++++++++++++    Medications:  Scheduled Meds:  Current Facility-Administered Medications   Medication Dose Route Frequency   . amiodarone  200 mg Oral Daily   . amLODIPine  10 mg Oral Daily   . aspirin EC  325 mg Oral Daily   . atorvastatin  10 mg Oral QHS   . calcitRIOL  0.25 mcg Oral Daily   . carvedilol  25 mg Oral Q12H SCH   . furosemide  20 mg Oral Daily   . gabapentin  300 mg Oral BID   . heparin (porcine)  5,000 Units Subcutaneous Q12H [facility]   . hydrALAZINE  75 mg Oral TID   . insulin aspart  3 Units Subcutaneous Once   . polyethylene glycol  17 g Oral Daily   . sodium bicarbonate  650 mg Oral Daily   . venlafaxine  75 mg Oral BID     Continuous Infusions:   PRN Meds:acetaminophen, albuterol-ipratropium, bisacodyl, dextrose, diphenhydrAMINE, glucagon (rDNA), insulin aspart, magnesium hydroxide, oxyCODONE-acetaminophen, traMADol    Objective:  Vital signs in last 24 hours:  Temp:  [98.1 F (36.7 C)-98.6 F (37 C)] 98.1 F (36.7 C)  Heart Rate:  [74-81] 79  Resp Rate:  [18] 18  BP: (139-155)/(54-67) 155/63 mmHg  Intake/Output last 24 hours:  No intake or output data in the 24 hours ending 03/21/14 1026  Intake/Output this shift:       Labs:    Recent Labs  Lab 03/19/14  0605 03/18/14  0950 03/17/14  0510   GLUCOSE 99 144* 114*   BUN 36* 35* 32*   CREATININE 2.1* 2.0* 1.9*   CALCIUM 8.9 8.9 8.6   SODIUM 143 139 141   POTASSIUM 5.0 5.3* 5.2*   CHLORIDE 109 107 109   CO2 25 24 24     ALBUMIN 2.8* 2.9* 2.8*   PHOSPHORUS 3.7 4.0  --        Recent Labs  Lab 03/19/14  0605 03/17/14  0510 03/15/14  0522   WBC 5.16 6.47 5.28   HEMOGLOBIN 8.9* 8.8* 9.0*   HEMATOCRIT 29.7* 29.8* 31.0*   MCV 88.9 89.5 89.9   MCH, POC 26.6* 26.4* 26.1*   MCHC 30.0* 29.5* 29.0*   RDW 22* 22* 22*   MPV 9.3* 9.7 9.5   PLATELETS 271 287 275

## 2014-03-21 NOTE — Rehab PSY Consult ([facility]) (Addendum)
Corrected 03/21/2014 11:42:46 AM    NAME: [name]  MRN: [id number]  Account: [id number]  Session Start: 03/21/2014 10:00:00 AM  Session Stop: 03/21/2014 11:00:00 AM    Psychology Services  Inpatient Rehabilitation Consultation    Rehab Diagnosis: Cardiac debility  Demographics:            Age: 64Y            Gender: Female    Past Medical History: Diabetes mellitus  Chronic kidney disease  Myocardial infarction  Hypertension  Hyperlipidemia  Peripheral arterial disease 06/28/13 right superficial femoral artery stenosis,  tibial peroneal trunk stenosis, left CVL a stenosis, angioplasty  Coronary artery disease  Left circumflex artery presumed DES, Chronically occluded right coronary artery  stent  Anemia  Arterial leg ulcer 2014, receiving home health  Chronic obstructive pulmonary disease    PAST SURGICAL HISTORY  Cardiac angiography and angioplasty   06/2013  Colonoscopy  Tubal ligation  History of Present Illness: 64 year old female with PMH DM2, PAD, HTN, CKD,  COPD, CAD s/p RCA stent initially admitted [facility] with syncope  02/26/14. Found to have NSTEMI and cardiac cath revealed multi vessel CAD.  Transferred [facility] 02/28/14 and subsequently underwent CABG x2 on 12/18. Patient  also with severe PAD with chronic RLE arterial ulcer, s/p angioplasty in 06/2013;  clinically improving  but has not had wound care recently. Prior to hosp  admission patient had not been taking her or Symbicort for COPD; resumed in  hospital. She is also being treated for asymptomatic bacteriuria and iron  deficiency anemia. She is DM2 and her Hgb A1C 7.1.            Date of Onset: 02/26/14            Date of Admission: 03/14/2014 6:35:00 PM    Medications and Allergies: Significant rehabilitation considerations:   Please refer to EPIC  Rehabilitation Precautions/Restrictions:   Falls, infection, sternal    Premorbid Functional Level: Patient reported being modified independent for  ambulation household distances using a rolling walker.  Modified independent  bathing, dressing LB, toileting  Understanding of Current Condition: I am weak and need rehab so I can go home  and live normally  Patient/Caregiver Goals:  Patient's functional goals: To go home and live  normally  Social History: Lives with daughter, [name], who works at night. Other daughter  lives in the area and can also help. Lives in a one story home with 1 STE.  Retired    Research scientist (physical sciences) Observations and Mental Status:  Pt seen for initial psychological  consultation to assess for adjustment to medical situation and rehab  hospitalization. H and P reviewed. Pt up in [facility] in room waiting between  therapies. Introduced myself and explained my role. Reviewed events leading to  current rehab admission. Reviewed other relevant psychosocial history. Mental  status: Awake, alert, oriented all spheres. Speech normal in process and  quality. Thoughts logical, coherent, goal-directed. Affect appropriate to  situation and congruent to thoughts. Mood worried. [name] began taking  Effexor (75mg  Q12) about 2 years when her medical problems began to mount and  she starting feeling more depressed. She feels her mood has been stable and she  has not noted any change in mood now post-cardiac surgery. Sleep and appetite  are at baseline. She does express fearfulness about the status of her right leg  and is worried that too much activity in therapy may be harmful. She would like  to  get back to her prior levels of functional independence.    Interdisciplinary Educational Needs and Learning Preferences:       Learning Preference: The patient's preferred learning method is:  Explanation.       Barriers to Learning: No barriers.       Learning Needs:  Coping strategies    Education Provided: Coping strategies .       Audience: Patient.       Mode: Explanation.       Response: Verbalized understanding.    Interventions:   NPSY HLTH ASSESS INI EA 15 MIN    ASSESSMENT  Impressions:  [name] appears to be  coping appropriately with her  post-cardiac surgery recovery and rehab hospitalization. She feels motivated to  participate in her rehab program but expresses some worry that excessive  activity may be harmful for the vascular problems of her right leg. Sleep and  appetite are at baseline. Mood is neutral and stable with current prescription  of Effexor (75mg  Q12).  Potential to Benefit: Able to participate in an intensive inpatient  interdisciplinary rehabilitation program, Living in the community premorbidly  Barriers to Progress/Discharge: Functional status, Medical condition    Long Term Goals:  Time frame to achieve long term goal(s): 2 weeks       1. Pt will demonstrate coping skills for adjusting to medical situation and  rehab hospitalization as reflected in self motivation in her rehab program and  positive expectations for increasing levels of functional independence    PLAN  Psychology services are recommended to address: Emotional support; Assistance  with coping skills  Recommendations:  Individual counseling as needed; Therapy cotreatment as needed      Care Plan  Identified problems from team documentation:  Problem: Impaired Endocrine/Metabolic Function  Endocrine: Primary Team Goal: Patient will demonstrate understanding of DM  management to include adm of insulin, s/s of hypo/ hyper glycemia./Active    Problem: Impaired Mobility  Mobility: Primary Team Goal: Patient will perform household functional mobility  mod I using LRAD in order to ensure safe d/c home./Active    Problem: Impaired Self-care Mgmt/ADL/IADL  Self Care: Primary Team Goal: Pt will be Mod I in ADL and functional mobility  and adhere to sternal precautions 100% of the time with no cuing./Active    Problem: Impaired Skin/Wound Mgmt  Skin Wound: Primary Team Goal: Patient and caregiver will be independent of  wound care/Active    Identified problems from this assessment:     Psychosocial : Pt will demonstrate coping skills for  adjusting to post-cardiac  surgery recovery as reflected in positive expectations for continuing to  increase levels of functional independence    Discipline:  Neuropsychology/Psychology    Please review Integrated Patient View Care Plan Flowsheet for Team identified  Problems, Interventions, and Goals.    Signed by: Mordecai Rasmussen, Ph.D., LICENSED CLINICAL PSYCHOLOGIST 03/21/2014  11:00:00 AM

## 2014-03-21 NOTE — Rehab Progress Note ([facility]) (Signed)
[name]  MRN: [id number]  Account: [id number]  Session Start: 03/21/2014 12:00:00 AM  Session Stop: 03/21/2014 12:00:00 AM    Rehabilitation Nursing  Inpatient Rehabilitation Shift Assessment    Rehab Diagnosis: Debility secondary to NSTEMI and multivessel CAD S/P CABG  Demographics:            Age: 64Y            Gender: Female  Primary Language: English    Date of Onset:  02/26/14  Date of Admission: 03/14/2014 6:35:00 PM    Rehabilitation Precautions Restrictions:   Falls, Infection, sternal    Patient Report: "Okay"  Pain: Patient currently without complaints of pain.  Pain Reassessment: Pain was not reassessed as no pain was reported.  Patient/Caregiver Goals:  To go home and live normally    NEURO  Orientation/Awareness: Alert and Oriented x3.  Speech: Clear.  Behavior: Cooperative.    MEDICATIONS  IV Access: No IV access.  Dialysis Access: Patient does not have dialysis access.      Elopement Risk Level Assessment Tool  Patient Criteria: Patient is not capable of leaving the unit.  Assessment is not  applicable.      RISK ASSESSMENT FOR FALLS/INJURY    MENTAL STATUS CRITERIA:   0 - None identified.  MENTAL STATUS TOTAL: 0    AGE CRITERIA:   13 - < 45 years old  AGE TOTAL: 0    ELIMINATION CRITERIA:   3 - Toileting with Assistance.   ELIMINATION TOTAL: 3    HISTORY OF FALLS CRITERIA:   2 - Unknown History.  HISTORY OF FALLS TOTAL: 2    MEDICATIONS CRITERIA:   2 or more High Risk Medications (*see list below)   MEDICATIONS TOTAL: 2    PHYSICAL MOBILITY CRITERIA:   3 - Decreased balance reaction.   1 - Weakness/impaired physical mobility.   PHYSICAL MOBILITY TOTAL: 4    FALLS RISK ASSESSMENT TOTAL: 11    Patient's Fall Risk: TOTAL SCORE >10: High Risk    Falls Interventions: Clutter removed and clear path to BR.  Call bell, phone, glasses, etc within reach.  Hourly toileting/safety checks between 6am and 10pm, then every 2 hours.  Yellow "high risk" patient identification in place: wrist band, socks,  chart  sticker, door sign.  Assistive devices at [facility].  Reoriented PRN.    NUTRITION  Diet:  Type: Consistent carbohydrate.  Food Consistency: Regular.  Liquid Consistency: Thin.      CARDIOVASCULAR     Bilateral lower extremities  Nail Bed Color: Pink.   Right ankle 2+. Elevated while in bed  Pulses:   Apical Pulse: Regular. Strong. Rate is 81 .   Patient does not have a pacemaker.   Patient does not have a defibrillator.    CARDIOPULMONARY  Lung Sounds:   Upper lobes. Clear.   Lower lobes. Clear.  Type of Respirations: Regular.  Cough: No cough noted.  Respiratory Support: The patient does not require any respiratory support.  Respiratory Equipment: None. O2 sat 94%    INTEGUMENTARY  Skin:  Temperature: Warm  Turgor: Normal for age  Moisture: Dry  Color of skin: Normal for Race/Ethnicity  Capillary Refill: Less than 3 seconds  Wounds/Incisions:       Right ankle and foot Diabetic ulcer drsg changed per order.  Braden Scale for Predicting Pressure Sore Risk: Sensory Perception: No  impairment  Moisture: Rarely moist  Activity: Chairfast  Mobility: Slightly limited  Nutrition: Adequate  Friction and  Shear: No apparent problem  Braden Score: 19  Level of Risk: No risk (19-23). Will reassess every shift.    GASTROINTESTINAL  Abdomen: Soft. Nontender.  Bowel Sounds:  Active bowel sounds audible in all four quadrants.  Date of Last Bowel Movement:  03/19/14   No Problems/Complaints with Bowel Elimination Assessed.    GENITOURINARY  Current Bladder Pattern: Continent  Color:  Yellow   Patient denies problems with urination and/or catheter.    MUSCULOSKELETAL  Upper Body: sternal precaution, no blood draw, PICC lines, IV on left upper  extremity  Lower Body: diabetic ulcer on Rt. ankle and foot    Functional Measures      BLADDER MANAGEMENT - LEVEL OF ASSIST: Bladder Score = 7. Patient is completely  independent for bladder management. There are no activity limitations.    BLADDER ACCIDENTS THIS SHIFT:  0 . Patient has not  had an accident this  assessment and did not require medications or devices.    BOWEL MANAGEMENT - LEVEL OF ASSIST: Bowel Score = 7.  Patient is completely  independent for bowel management.  Patient did not have bowel movement. No  medication/intervention was provided.    BOWEL ACCIDENTS THIS SHIFT: 0 . Patient has not had an accident and did not  require medications or devices.    Education Provided:    Education Provided: Precautions. Pain management. Pain scale. Plan of care.  Safety issues and interventions. Fall protocol. Medication. Name and dosage.  Administration. Purpose. Side Effects.       Audience: Patient.       Mode: Explanation.       Response: Verbalized understanding.    Discharge: Patient is not being discharged at this time.    Long Term Goals:  1. Patient will be able to demonstrate skill in wound care,  dressing changes and skin monitoring with out queing. - Goal Not Met   2. Patient will perform wound dressing changes independently. - Goal Not Met   3. Patient will demonstrate safe transfers from bed to wheelchair, bed to chair  and and bed to bsc using sternal precautions.   4. Patient will communicate her understandfing of s/s of  wound infection and  when to call the doctor.   5. Patient will demonstrate her understanding of DM and how to monitor BS, adm  insulin, and s/s of hypo and hyperglycemia. - Goal Met  Jan 11th (2 weeks)  Short Term Goals:  1. Patient will be able to call 100 percent of the time for  help when getting out of bed. She will communicate her understanding for  assistance. - Goal Met   2. Patient will be able to, at the end of the week demonstrate good hand  washing techniques, and wound care for lower right extremity with reach bar if  needed. - Goal Not Met  January 4th, one week    PROGRESS TOWARD GOALS: Goals are progressing    PLAN: Nursing Specific Interventions  Pain Management. Skin Management. Wound Management.  Continue with the current Nursing Plan of  Care.    TEAM CARE PLAN  Identified problems from team documentation:  Problem: Impaired Endocrine/Metabolic Function  Endocrine: Primary Team Goal: Patient will demonstrate understanding of DM  management to include adm of insulin, s/s of hypo/ hyper glycemia./Active    Problem: Impaired Mobility  Mobility: Primary Team Goal: Patient will perform household functional mobility  mod I using LRAD in order to ensure safe d/c home./Active  Problem: Impaired Self-care Mgmt/ADL/IADL  Self Care: Primary Team Goal: Pt will be Mod I in ADL and functional mobility  and adhere to sternal precautions 100% of the time with no cuing./Active    Problem: Impaired Skin/Wound Mgmt  Skin Wound: Primary Team Goal: Patient and caregiver will be independent of  wound care/Active    Add/Update Problems from this assessment:  No updates at this time.    Please review Integrated Patient View Care Plan Flowsheet for Team identified  Problems, Interventions, and Goals.    Signed by: Jacqualyn Posey, RN 03/21/2014 1:45:00 AM

## 2014-03-21 NOTE — Rehab Progress Note ([facility]) (Signed)
[name]  MRN: [id number]  Account: [id number]  Session Start: 03/21/2014 9:00:00 AM  Session Stop: 03/21/2014 10:00:00 AM    Occupational Therapy  Inpatient Rehabilitation Progress Note    Rehab Diagnosis: Debility secondary to NSTEMI and multivessel CAD S/P CABG  Demographics:            Age: 30Y            Gender: Female  Rehabilitation Precautions/Restrictions:   Falls, Infection, sternal    SUBJECTIVE  Patient/Caregiver Goals: To go home and live normally  Pain: Patient currently has pain.  Patient reports a pain level of 6 out of 10. RN notified, pain medication  administered    OBJECTIVE  Functional Measures      GROOMING: Grooming Score = 7.  Patient is completely independent for grooming.  There are no activity limitations.    BATHING: Patient bathed in shower. Bathing Score = 5.  Patient is  supervision/set-up for bathing, requiring: Standing by.  Patient requires the following assistive device(s): Hand held shower.    UPPER BODY DRESSING: Upper Body Dressing Score = 6 Patient is modified  independent for upper body dressing, requiring:  wheelchair for clothing  retrieval    LOWER BODY DRESSING: Lower Body Dressing Score = 5. Patient is  supervision/set-up for lower body dressing, requiring: Verbal cuing, prompting,  or instructing.  Patient requires the following assistive device(s): No assistive devices were  required.    TOILETING: Toileting Score = 5.  Patient is supervision/set-up for toileting,  requiring: Verbal cuing, prompting, or instructing.  Patient requires the following assistive device(s):  Grab bar.    TRANSFER TOILET: Toilet Transfer Score = 5.  Patient is supervision/set-up for  transferring to and from the toilet/commode, requiring: Verbal cuing, prompting,  or instructing.  Patient requires the following assistive device(s):  Grab bars.    TRANSFER SHOWER: Shower Transfer Score = 5.  Patient is supervision/set-up for  transferring to and from the shower, requiring: Verbal cuing,  prompting, or  instructing. Shower chair.    Interventions:       Self Care/Home Management:  OT treatment focused on therapeutic engagement  in self-care routine. Pt complete a shower seated on shower chair. Pt requires  supervision for ADl routine with verbal cues for safety and sternal precautions.  Engaged in wheelchair propulsion x179ft using bilateral LE with emphasis on use  of right LE. Pt left seated in wheelchair, all needs within reach, hand off to  RN.  Pain Reassessment:  Response to Pain Intervention: Pt resting quietly in wheelchair  Post Intervention Pain Quality:  Throbbing.  Patient Reports Post Intervention Pain Level of: 6 out of 10  Pain Acceptable: No: premedicate for therapy, rest breaks, positioning  Equipment Provided: The patient has been provided with information and care of  equipment as needed. Provided information concerning tub transfer bench    Education Provided:    Education Provided: Precautions. Plan of care. Activities of daily living.  Functional transfers.       Audience: Patient.       Mode: Explanation.  Demonstration.       Response: Needs practice.  Needs reinforcement.    ASSESSMENT  Pt has progressed to supervision level for self-care routine, but continues to  require prompting for safety and adherence to sternal precautions during  transfers and standing tasks. Continue OT POC to progress to mod I for ADL.    Long Term Goals: Pt will be Mod I in ADL routine  Pt will be Mod I in functional mobility, DME PRN  Pt will adhere to sternal precautions 100% of the time with no cuing  Pt will be Independent in bed mobility from flat surface with no cues to adhere  to sternal precautions.  Pt will complete cooking task at stove top with S, DME PRN.  7-10 days from initial eval on 03/15/14  Short Term Goals:    Progress Towards Goals:   LONG TERM GOAL REVIEW:       1. Pt will be Mod I in ADL routine - Not Met: requires cues for sternal  precautions       2. Pt will be Mod I in  functional mobility, DME PRN - Not Met requires cues  for safety and sternal precautions       3. Pt will adhere to sternal precautions 100% of the time with no cuing -  Not Met requires cues during sit to stands       4. Pt will be Independent in bed mobility from flat surface with no cues to  adhere to sternal precautions. - Not Met continues to use UE during bed mobility         5. Pt will complete cooking task at stove top with S, DME PRN. - Not Met Pt  repeatedly declines cooking tasks  Time frame to achieve long term goal(s):  7-10 days from initial eval on  03/15/14    PLAN  Continued Occupational Therapy is recommended.  Recommended Frequency/Duration/Intensity: 60-120 minutes, 5-6x week for 7-10  days from initial eval on 03/15/14  Continued Activities Contributing Toward Care Plan: ADL/IADL retraining, AE  training, sternal precautions, TherEx, TherAct, d/c planning    Care Plan  Identified problems from team documentation:  Problem: Impaired Endocrine/Metabolic Function  Endocrine: Primary Team Goal: Patient will demonstrate understanding of DM  management to include adm of insulin, s/s of hypo/ hyper glycemia./Active    Problem: Impaired Mobility  Mobility: Primary Team Goal: Patient will perform household functional mobility  mod I using LRAD in order to ensure safe d/c home./Active    Problem: Impaired Self-care Mgmt/ADL/IADL  Self Care: Primary Team Goal: Pt will be Mod I in ADL and functional mobility  and adhere to sternal precautions 100% of the time with no cuing./Active    Problem: Impaired Skin/Wound Mgmt  Skin Wound: Primary Team Goal: Patient and caregiver will be independent of  wound care/Active    Add/Update Problems from this Treatment:  Update of existing problem:   Self Care Management: Pt requires supervision for self-care routine due to  difficulty adhering to sternal precautions.    Discipline:  Occupational Therapy    Please review Integrated Patient View Care Plan Flowsheet for Team  identified  Problems, Interventions, and Goals.    3 Hour Rule Minutes: 60 minutes of OT treatment this session count towards  intensity and duration of therapy requirement. Patient was seen for the full  scheduled time of OT treatment this session.  Therapy Mode Minutes: Individual: 60 minutes.    Signed by: Fransisco Beau, OTR/L 03/21/2014 10:00:00 AM

## 2014-03-21 NOTE — Rehab Progress Note ([facility]) (Signed)
[name]  MRN: [id number]  Account: [id number]  Session Start: 03/21/2014 12:00:00 AM  Session Stop: 03/21/2014 12:00:00 AM    SEVERE SEPSIS SCREEN  INFECTION:  Patient has no indication of infection.  Negative Sepsis Screen.  If you are unable to assess a system's dysfunction because you do not have labs,  or the labs you have are not current (within 24 hours), call physician and  request and order for the lab tests needed.    Signed by: Jacqualyn Posey, RN 03/21/2014 8:09:00 PM

## 2014-03-21 NOTE — Rehab Progress Note ([facility]) (Signed)
NAMECORTLYN [name]  MRN: [id number]  Account: [id number]  Session Start: 03/21/2014 11:00:00 AM  Session Stop: 03/21/2014 12:00:00 PM    Physical Therapy  Inpatient Rehabilitation Progress Note - Brief    Rehab Diagnosis: Cardiac debility  Demographics:            Age: 53Y            Gender: Female  Rehabilitation Precautions/Restrictions:   Falls, infection, sternal  chronic R foot wound    SUBJECTIVE  Patient Report: "It hurts to move that foot.  I've had a wound there for two  years"  Pain: Patient currently has pain.  Patient reports a pain level of - unable to articulate. pt c/o pain in R foot  with weightbearing or ROM but did not rate    OBJECTIVE  General Observations: Pt seated in w/c; sternal incision well-healing open to  air; R foot with significant edema covered by dressing and sock.  Vital Signs:                       Current Value                Previous Value  Vitals  Time                 1100                         1405  Position/Activity    seated                       sitting  BP Systolic          141                          155/63  BP Diastolic         65                           -  Pulse                70                           79  Respirations         -                            18    Interventions:       Therapeutic Activities:  Pt ambulated in room, therapy gym multiple  repetitions with SBA/occasional manual facilitation up to 20' with RW.  Antalgic  gait pattern with step-to, unable to achieve tibial translation over R foot due  to limited R DF ROM.  Compensatory hip flex during stance and significant decr  stance time on R LE.  Pt performed SPT without AD with close SBA.  Bed mobility using long sit  technique with SBA, multiple repetitions.  No cuing required for sternal  precautions throughout session though educated on safety; pt verbalized  understanding.  Pt performed seated partial abdominal crunches for core strengthening, R foot  heel slides and AROM within available range ankle  pumps/circles/alphabet to incr  ROM.  Pt returned to room, all needs met, handoff to RN.    Education Provided:    Education Provided:  Precautions. Home exercise/activity plan.       Audience: Patient.       Mode: Explanation.  Demonstration.       Response: Verbalized understanding.    ASSESSMENT  Patient demonstrates overall SBA for mobility; limited by R foot wound with decr  ankle DF ROM and pain.  Pt continues to benefit from skilled IP PT for safe d/c  home with family.    PLAN  Continued Physical Therapy is recommended.  Recommended Frequency/Duration/Intensity: 1-2 hours per day, 5-6 days per week  for 2 weeks after initial eval on 03/15/14  Activities Contributing Toward Care Plan: bed mobility, transfer training,  aerobic and endurance training, balance activity, strengthening, procurement of  DME, caregiver training, pt education and training on falls prevention, etc.    3 Hour Rule Minutes: 60 minutes of PT treatment this session count towards  intensity and duration of therapy requirement. Patient was seen for the full  scheduled time of PT treatment this session.  Therapy Mode Minutes: Individual: 60 minutes.    Signed by: Lockie Pares, PT 03/21/2014 12:00:00 PM

## 2014-03-22 LAB — CBC AND DIFFERENTIAL
Basophils Absolute Automated: 0.02 10*3/uL (ref 0.00–0.20)
Basophils Automated: 0 %
Eosinophils Absolute Automated: 0.33 10*3/uL (ref 0.00–0.70)
Eosinophils Automated: 7 %
Hematocrit: 30.2 % — ABNORMAL LOW (ref 37.0–47.0)
Hgb: 9 g/dL — ABNORMAL LOW (ref 12.0–16.0)
Immature Granulocytes Absolute: 0.01 10*3/uL
Immature Granulocytes: 0 %
Lymphocytes Absolute Automated: 0.87 10*3/uL (ref 0.50–4.40)
Lymphocytes Automated: 18 %
MCH: 26.4 pg — ABNORMAL LOW (ref 28.0–32.0)
MCHC: 29.8 g/dL — ABNORMAL LOW (ref 32.0–36.0)
MCV: 88.6 fL (ref 80.0–100.0)
MPV: 9.8 fL (ref 9.4–12.3)
Monocytes Absolute Automated: 0.54 10*3/uL (ref 0.00–1.20)
Monocytes: 11 %
Neutrophils Absolute: 3.19 10*3/uL (ref 1.80–8.10)
Neutrophils: 64 %
Nucleated RBC: 0 /100 WBC (ref 0–1)
Platelets: 276 10*3/uL (ref 140–400)
RBC: 3.41 10*6/uL — ABNORMAL LOW (ref 4.20–5.40)
RDW: 21 % — ABNORMAL HIGH (ref 12–15)
WBC: 4.95 10*3/uL (ref 3.50–10.80)

## 2014-03-22 LAB — HEMOLYSIS INDEX: Hemolysis Index: 0 (ref 0–18)

## 2014-03-22 LAB — IRON PROFILE
Iron Saturation: 14 % — ABNORMAL LOW (ref 15–50)
Iron: 29 ug/dL — ABNORMAL LOW (ref 40–145)
TIBC: 212 ug/dL — ABNORMAL LOW (ref 265–497)
UIBC: 183 ug/dL (ref 126–382)

## 2014-03-22 LAB — RENAL FUNCTION PANEL
Albumin: 2.8 g/dL — ABNORMAL LOW (ref 3.5–5.0)
Anion Gap: 9 (ref 5.0–15.0)
BUN: 35 mg/dL — ABNORMAL HIGH (ref 7–19)
CO2: 22 mEq/L (ref 22–29)
Calcium: 9 mg/dL (ref 8.5–10.5)
Chloride: 108 mEq/L (ref 100–111)
Creatinine: 1.9 mg/dL — ABNORMAL HIGH (ref 0.6–1.0)
Glucose: 118 mg/dL — ABNORMAL HIGH (ref 70–100)
Phosphorus: 3.8 mg/dL (ref 2.3–4.7)
Potassium: 4.9 mEq/L (ref 3.5–5.1)
Sodium: 139 mEq/L (ref 136–145)

## 2014-03-22 LAB — GLUCOSE WHOLE BLOOD - POCT
Whole Blood Glucose POCT: 114 mg/dL — ABNORMAL HIGH (ref 70–100)
Whole Blood Glucose POCT: 263 mg/dL — ABNORMAL HIGH (ref 70–100)
Whole Blood Glucose POCT: 105 mg/dL — ABNORMAL HIGH (ref 70–100)
Whole Blood Glucose POCT: 128 mg/dL — ABNORMAL HIGH (ref 70–100)

## 2014-03-22 LAB — FERRITIN: Ferritin: 431.87 ng/mL — ABNORMAL HIGH (ref 4.60–204.00)

## 2014-03-22 LAB — GFR: EGFR: 32.1

## 2014-03-22 MED ORDER — HYDRALAZINE HCL 25 MG PO TABS
100.0000 mg | ORAL_TABLET | Freq: Three times a day (TID) | ORAL | Status: DC
Start: 2014-03-22 — End: 2014-03-25
  Administered 2014-03-22 – 2014-03-25 (×9): 100 mg via ORAL
  Filled 2014-03-22 (×10): qty 4

## 2014-03-22 MED ORDER — IRON SUCROSE 20 MG/ML IV SOLN
200.0000 mg | INTRAVENOUS | Status: AC
Start: 2014-03-22 — End: 2014-03-24
  Administered 2014-03-22 – 2014-03-24 (×3): 200 mg via INTRAVENOUS
  Filled 2014-03-22 (×3): qty 10

## 2014-03-22 MED ORDER — POLYETHYLENE GLYCOL 3350 17 G PO PACK
17.0000 g | PACK | Freq: Every day | ORAL | Status: DC | PRN
Start: 2014-03-22 — End: 2014-03-25

## 2014-03-22 MED ORDER — DOCUSATE SODIUM 100 MG PO CAPS
100.0000 mg | ORAL_CAPSULE | Freq: Every day | ORAL | Status: DC
Start: 2014-03-23 — End: 2014-03-25
  Administered 2014-03-23 – 2014-03-25 (×3): 100 mg via ORAL
  Filled 2014-03-22 (×3): qty 1

## 2014-03-22 MED ORDER — CADEXOMER IODINE 0.9 % EX GEL
Freq: Every day | CUTANEOUS | Status: DC
Start: 2014-03-23 — End: 2014-03-23
  Filled 2014-03-22: qty 40

## 2014-03-22 MED ORDER — DIPHENHYDRAMINE-ZINC ACETATE 2-0.1 % EX CREA
TOPICAL_CREAM | Freq: Three times a day (TID) | CUTANEOUS | Status: DC | PRN
Start: 2014-03-22 — End: 2014-03-25
  Filled 2014-03-22: qty 28.4

## 2014-03-22 NOTE — Rehab Progress Note ([facility]) (Addendum)
Corrected 03/22/2014 11:02:47 AM    NAME: [name]  MRN: [id number]  Account: [id number]  Session Start: 03/21/2014 2:00:00 PM  Session Stop: 03/21/2014 3:00:00 PM    Physical Therapy  Inpatient Rehabilitation Progress Note    Rehab Diagnosis: Cardiac debility  Demographics:            Age: 52Y            Gender: Female  Rehabilitation Precautions/Restrictions:   Falls, Infection, sternal    SUBJECTIVE  Patient Report: "I'm been in a bad mood because my foot is giving me so much  trouble."  Patient/Caregiver Goals:  To go home and live normally  Pain: Patient currently has pain.  Patient reports a pain level of 5 out of 10. Repositioned patient. right LE  repositioned onto articulating leg rest; pt medicated prior to session.    OBJECTIVE  General Observations: Pt seated in w/c; sternal incision well-healing open to  air; R foot with significant edema covered by dressing and sock.  Vital Signs:                       Current Value                Previous Value  Vitals  Time                 -                            1100  Position/Activity    -                            seated  BP Systolic          -                            171/66  Pulse                -                            79  Respirations         -                            18    Functional Measures      TRANSFERS BED/CHAIR/WHEELCHAIR: Bed/chair/wheelchair Transfer Score = 5.  Patient is supervision/set-up for transferring to and from the  bed/chair/wheelchair, requiring: Stand by assistance.  Patient requires the following assistive device(s): Walker.    LOCOMOTION WHEELCHAIR:   Wheelchair locomotion was observed using a manual wheelchair. Wheelchair  Distance Scale = 3.  Distance traveled in wheelchair is greater than 150 feet.  Wheelchair Score = 5.  Patient is supervision or set up only for propelling  wheelchair, requiring: Stand by assistance. Patient was able to propel a  distance of 150 feet in a wheelchair.  pt uses LEs only due to  sternal  precautions.    LOCOMOTION WALK:   Walk Distance Scale = 2.  Distance walked is 50 -149 feet. Walk Score = 2.  Patient performs 75% or more of effort and requires minimal assistance.  Incidental assistance, contact guard or steadying was provided. Patient walked a  distance of 100 feet. Rolling walker.  LOCOMOTION STAIRS: Stairs Score = 1.  Patient performs 50-74% of effort and  requires moderate assistance. Patient negotiated 1 stairs.  Patient requires the following assistive device(s):  No assistive devices were  required.    Interventions:       Therapeutic Activities:  Pain Reassessment: see above  Equipment Provided: None at this time.    Education Provided:    Education Provided: Engineer, production. Gait. Plan of  care.       Audience: Patient.       Mode: Explanation.  Demonstration.       Response: Demonstrated skill.  Needs reinforcement.    ASSESSMENT  Mrs Giacobbe continues to be most limited by right foot pain and ROM deficits. She  presents with fear of falling while negotiating stairs necessary to enter her  home. Pt will benefit from continued PT in order to maximize safety and  independence with mobility, to reduce fall risk and to prepare for safe d/c  home.    Long Term Goals: The pt will perform all bed mobility mod I, from flat surface  and without use of bedrails in order to ensure safe use of standard bed at home.  Pt will transfer bed to and from chair mod I using LRAD in order to ensure safe  transfers at home.  Pt will ambulate at least 50 feet mod I using LRAD, in order to ensure  unrestricted access to home environment.  Pt will negotiate 1 standard stair using LRAD in order to ensure safe entry and  exiting her home environment.  Pt will perform car transfer with supervision in order to ensure safe transport  home.  Caregiver will demonstrate safe guarding and assistance techniques in order to  ensure safe d/c home.  2 weeks after initial eval on  03/15/14  Short Term Goals: Pt will correctly identify sternal precautions with 100%  accuracy, without cuing.  Pt will ambulate 50 feet with supervision.  Pt will identify caregiver in order to ensure safe d/c home.  1 week from initial eval on 03/15/14    Progress Towards Goals: Pt requires supervision to CGA for safety with mobility,  using RW.    PLAN  Continued Physical Therapy is recommended.  Recommended Frequency/Duration/Intensity: 1-2 hours per day, 5-6 days per week  for 2 weeks after initial eval on 03/15/14  Activities Contributing Toward Care Plan: bed mobility, transfer training,  aerobic and endurance training, balance activity, strengthening, procurement of  DME, caregiver training, pt education and training on falls prevention, etc.    Care Plan  Identified problems from team documentation:  Problem: Impaired Endocrine/Metabolic Function  Endocrine: Primary Team Goal: Patient will demonstrate understanding of DM  management to include adm of insulin, s/s of hypo/ hyper glycemia./Active    Problem: Impaired Mobility  Mobility: Primary Team Goal: Patient will perform household functional mobility  mod I using LRAD in order to ensure safe d/c home./Active    Problem: Impaired Psychosocial Skills/Behavior  PsychoSocial: Primary Team Goal: Pt will demonstrate coping skills for adjusting  to post-cardiac surgery recovery as reflected in positive expectations for  continuing to increase levels of functional independence/Active    Problem: Impaired Self-care Mgmt/ADL/IADL  Self Care: Primary Team Goal: Pt will be Mod I in ADL and functional mobility  and adhere to sternal precautions 100% of the time with no cuing./Active    Problem: Impaired Skin/Wound Mgmt  Skin Wound: Primary Team Goal: Patient and caregiver will be independent of  wound  care/Active    Add/Update Problems from this Treatment:  Update of existing problem:   Mobility: Pt requires supervision to CGA for safety with mobility, using  RW.    Discipline:  Physical Therapy    Please review Integrated Patient View Care Plan Flowsheet for Team identified  Problems, Interventions, and Goals.    3 Hour Rule Minutes: 60 minutes of PT treatment this session count towards  intensity and duration of therapy requirement. Patient was seen for the full  scheduled time of PT treatment this session.  Therapy Mode Minutes: Individual: 60 minutes.    Signed by: Arnoldo Hooker, PT  03/21/2014 3:00:00 PM

## 2014-03-22 NOTE — Rehab Progress Note ([facility]) (Signed)
[name]  MRN: [id number]  Account: [id number]  Session Start: 03/22/2014 12:00:00 AM  Session Stop: 03/22/2014 12:00:00 AM    SEVERE SEPSIS SCREEN  INFECTION:  Patient has no indication of infection.  Negative Sepsis Screen.  If you are unable to assess a system's dysfunction because you do not have labs,  or the labs you have are not current (within 24 hours), call physician and  request and order for the lab tests needed.    Signed by: Lia Foyer, RN 03/22/2014 7:30:00 PM

## 2014-03-22 NOTE — Progress Notes (Signed)
PHYSICAL MEDICINE AND REHABILITATION  PROGRESS NOTE    Date Time: 03/22/2014 9:28 PM  Patient Name: [name], [name]    Admission date:  03/14/2014      Subjective:   She complains of swelling in the right leg.  She also reports pain in the right foot.  She is trying to eat and drink.  She is complaining of itchiness around her sternum.  No problems with bowel or bladder.  I spoke with nursing. The patient reports being worried in general.  Functional Status:     PT:  Therapeutic Activities: Pt ambulated in room, therapy gym multiple  repetitions with SBA/occasional manual facilitation up to 29' with RW. Antalgic  gait pattern with step-to, unable to achieve tibial translation over R foot due  to limited R DF ROM. Compensatory hip flex during stance and significant decr  stance time on R LE.  Pt performed SPT without AD with close SBA. Bed mobility using long sit  technique with SBA, multiple repetitions. No cuing required for sternal  precautions throughout session though educated on safety; pt verbalized  understanding.  Pt performed seated partial abdominal crunches for core strengthening, R foot  heel slides and AROM within available range ankle pumps/circles/alphabet to incr  ROM.     OT:  Therapeutic Activities: OT treatment addressed safety and equipment use  during functional mobility tasks in the home. Pt propelled wheelchair greater  than household distances with BLE to improve overall conditioning. Practiced tub  transfer using tub transfer bench with mod I. Performed household distance  ambulation with RW with supervision to enable pt to access her bathroom.  Practiced high/low item retrieval with reacher in standing using RW.    Self Care/Home Management: Initially OT treatment focused on home safety  education and fall prevention education 2/2 deficits in balance, endurance.  Patient ambulated with RW approx50'/80' with SPV. Patient able to self-monitor need  for standing rest breaks. Patient engaged in  laundry task to load/unload  front-load washing machine using safe techniques 2/2 sternal precautions.  Patient able to demo. use of reacher to obtain single clothing items from  machine w/use of safe technique and no LOB. Patient utilized reacher to pick up  items from floor within sternal precautions following initial demo/instruction.    Medications:   Medication reviewed by me:     Scheduled Meds: PRN Meds:        amiodarone 200 mg Oral Daily   amLODIPine 10 mg Oral Daily   aspirin EC 325 mg Oral Daily   atorvastatin 10 mg Oral QHS   [START ON 03/23/2014] cadexomer iodine  Topical Daily   calcitRIOL 0.25 mcg Oral Daily   carvedilol 25 mg Oral Q12H SCH   furosemide 20 mg Oral Daily   gabapentin 300 mg Oral BID   heparin (porcine) 5,000 Units Subcutaneous Q12H SCH   hydrALAZINE 100 mg Oral TID   insulin aspart 3 Units Subcutaneous Once   iron sucrose 200 mg Intravenous Q24H SCH   polyethylene glycol 17 g Oral Daily   venlafaxine 75 mg Oral BID       Continuous Infusions:      acetaminophen 650 mg Q6H PRN   albuterol-ipratropium 3 mL Q2H PRN   bisacodyl 10 mg QD PRN   dextrose 25 mL PRN   diphenhydrAMINE 25 mg BID PRN   diphenhydrAMINE-zinc acetate  TID PRN   glucagon (rDNA) 1 mg PRN   insulin aspart 1-8 Units PRN   magnesium hydroxide 10 mL  Q6H PRN   oxyCODONE-acetaminophen 1 tablet Q4H PRN   traMADol 50 mg Q6H PRN           Review of Systems:   A comprehensive review of systems was: No fevers, chills, nausea, vomiting, chest pain, shortness of breath, cough, headache, double vision.  All others negative.    Physical Exam:     Filed Vitals:    03/22/14 0524 03/22/14 0858 03/22/14 1637 03/22/14 2010   BP: 171/66 158/60 150/80 127/52   Pulse: 79 90  74   Temp:    98.6 F (37 C)   TempSrc:    Oral   Resp:       SpO2: 95%   96%       Intake and Output Summary (Last 24 hours) at Date Time  No intake or output data in the 24 hours ending 03/22/14 2128                    Cardiac: regular rhythm  Chest / Lungs:  Clear to  auscultation.  Abdomen:  + bowel sounds, Soft, non-tender, non-distended.  Extremities: no calf tenderness.  Right dorsal foot is dressed.  Right ankle 1+ edema.    Labs:   No results for input(s): GLUCOSEWHOLE in the last 24 hours.      Recent Labs  Lab 03/22/14  0520 03/19/14  0605 03/17/14  0510   WBC 4.95 5.16 6.47   HEMOGLOBIN 9.0* 8.9* 8.8*   HEMATOCRIT 30.2* 29.7* 29.8*   PLATELETS 276 271 287          Recent Labs  Lab 03/22/14  0520 03/19/14  0605 03/18/14  0950 03/17/14  0510   SODIUM 139 143 139 141   POTASSIUM 4.9 5.0 5.3* 5.2*   CHLORIDE 108 109 107 109   CO2 22 25 24 24    BUN 35* 36* 35* 32*   CREATININE 1.9* 2.1* 2.0* 1.9*   CALCIUM 9.0 8.9 8.9 8.6   ALBUMIN 2.8* 2.8* 2.9* 2.8*   PROTEIN, TOTAL  --   --   --  5.8*   BILIRUBIN, TOTAL  --   --   --  0.2   ALKALINE PHOSPHATASE  --   --   --  78   ALT  --   --   --  13   AST (SGOT)  --   --   --  17   GLUCOSE 118* 99 144* 114*   PHOSPHORUS 3.8 3.7 4.0  --        No results for input(s): INR, APTT in the last 168 hours.    Results     Procedure Component Value Units Date/Time    Wound culture & gram stain [[id number]] Collected:  03/22/14 1600    Specimen Information:  Wound / Drainage Updated:  03/22/14 1825    Glucose Whole Blood - POCT [[id number]]  (Abnormal) Collected:  03/22/14 1624     POCT - Glucose Whole blood 105 (H) mg/dL Updated:  46/96/29 5284    Glucose Whole Blood - POCT [[id number]]  (Abnormal) Collected:  03/22/14 1110     POCT - Glucose Whole blood 263 (H) mg/dL Updated:  72/53/66 4403    Ferritin [[id number]]  (Abnormal) Collected:  03/22/14 0520    Specimen Information:  Blood Updated:  03/22/14 1000     Ferritin 431.87 (H) ng/mL     IRON PROFILE [[id number]]  (Abnormal) Collected:  03/22/14 0520     Iron 29 (L) ug/dL  Updated:  03/22/14 0828     UIBC 183 ug/dL      TIBC 161 (L) ug/dL      Iron Saturation 14 (L) %     Hemolysis index [[id number]] Collected:  03/22/14 0520     Hemolysis Index 0 Updated:  03/22/14 0828    Renal function panel  [[id number]]  (Abnormal) Collected:  03/22/14 0520    Specimen Information:  Blood Updated:  03/22/14 0620     Glucose 118 (H) mg/dL      Sodium 956 mEq/L      Potassium 4.9 mEq/L      Chloride 108 mEq/L      CO2 22 mEq/L      BUN 35 (H) mg/dL      CALCIUM 9.0 mg/dL      Creatinine 1.9 (H) mg/dL      Albumin 2.8 (L) g/dL      Phosphorus 3.8 mg/dL      Anion Gap 9.0     GFR [[id number]] Collected:  03/22/14 0520     EGFR 32.1 Updated:  03/22/14 0620    CBC and differential [[id number]]  (Abnormal) Collected:  03/22/14 0520    Specimen Information:  Blood / Blood Updated:  03/22/14 0552     WBC 4.95 x10 3/uL      Hgb 9.0 (L) g/dL      Hematocrit 41.3 (L) %      Platelets 276 x10 3/uL      RBC 3.41 (L) x10 6/uL      MCV 88.6 fL      MCH 26.4 (L) pg      MCHC 29.8 (L) g/dL      RDW 21 (H) %      MPV 9.8 fL      Neutrophils 64 %      Lymphocytes Automated 18 %      Monocytes 11 %      Eosinophils Automated 7 %      Basophils Automated 0 %      Immature Granulocyte 0 %      Nucleated RBC 0 /100 WBC      Neutrophils Absolute 3.19 x10 3/uL      Abs Lymph Automated 0.87 x10 3/uL      Abs Mono Automated 0.54 x10 3/uL      Abs Eos Automated 0.33 x10 3/uL      Absolute Baso Automated 0.02 x10 3/uL      Absolute Immature Granulocyte 0.01 x10 3/uL     Glucose Whole Blood - POCT [[id number]]  (Abnormal) Collected:  03/22/14 0525     POCT - Glucose Whole blood 114 (H) mg/dL Updated:  53/66/44 0347    Glucose Whole Blood - POCT [[id number]]  (Abnormal) Collected:  03/21/14 2102     POCT - Glucose Whole blood 160 (H) mg/dL Updated:  56/43/32 9518               Rads:   Radiological Procedure reviewed.  Radiology Results (24 Hour)     ** No results found for the last 24 hours. 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  - We had a team  conference today.  Please see [facility] notes.  -  Estimated length of stay to home with March 25, 2014 if medically stable.  - Continue with current pain medications. Increased doses of Percocet give her hallucinations.  She is dealing with her pain with 5 mg Percocet as needed.                Blood loss anemia also with iron deficiency  - Appreciate nephrology efforts.  On IV iron.    History of anemia of chronic disease  - Follow iron studies  Chronic kidney disease III  - Nephrology is following.  Slightly reduced creatinine.  - Continue with Lasix  Right foot chronic arterial ulcer  - Continue with local care.    - I spoke with the wound care nurse.  Consult the wound healing Center.  - Eventual outpatient followup with vascular surgery.  I discussed with the patient her history of acute kidney injury with IV contrast but the eventual need for scheduled angiogram.  Diabetes mellitus  - Mostly controlled blood glucose.  Follow with current diet.  History of moderate aortic stenosis  History of LVEF 45%  Severe peripheral arterial disease  History of COPD    Patient Active Problem List   Diagnosis   . NSTEMI (non-ST elevated myocardial infarction)   . Hypertensive urgency   . PAD (peripheral artery disease)   . Arterial leg ulcer   . Chronic obstructive pulmonary disease, unspecified COPD type   . Ischemic cardiomyopathy   . CAD (coronary artery disease)   . Diabetes   . Chronic kidney disease   . Debilitated       Continue comprehensive and intensive inpatient rehab program, including:   Physical therapy 60-120 min daily, 5-6 times per week, Occupational therapy 60-120 min daily, 5-6 times per week, Case management and Rehabilitation nursing    Signed by: [name] MD    [facility]

## 2014-03-22 NOTE — Progress Notes (Addendum)
[facility]    History:    -CKD III, baseline Cr 1.6-1.7 mg/dL  -AKI, hemodynamically mediated  -LE edema  -HTN with CKD   -Anemia with CKD  -3 vessels CAD; s/p cath and CABG   -PVD with right foot ulcer  -metabolic acidosis    Assessment/Plan:    Iron deficient and BP high so will add IV iron and increase the hydralazine dose.    Scheduled Meds:  Current Facility-Administered Medications   Medication Dose Route Frequency   . amiodarone  200 mg Oral Daily   . amLODIPine  10 mg Oral Daily   . aspirin EC  325 mg Oral Daily   . atorvastatin  10 mg Oral QHS   . calcitRIOL  0.25 mcg Oral Daily   . carvedilol  25 mg Oral Q12H SCH   . furosemide  20 mg Oral Daily   . gabapentin  300 mg Oral BID   . heparin (porcine)  5,000 Units Subcutaneous Q12H [facility]   . hydrALAZINE  75 mg Oral TID   . insulin aspart  3 Units Subcutaneous Once   . polyethylene glycol  17 g Oral Daily   . sodium bicarbonate  650 mg Oral Daily   . venlafaxine  75 mg Oral BID       Continuous Infusions:     PRN Meds:acetaminophen, albuterol-ipratropium, bisacodyl, dextrose, diphenhydrAMINE, glucagon (rDNA), insulin aspart, magnesium hydroxide, oxyCODONE-acetaminophen, traMADol    Subjective:    Responsive and comfortable  HEENT: no visual complaint, cough, hearing deficit  Lungs: no cough, SOB  CV: No chest pain, palpitations  Abd: No constipation, pain, N&V  Ext: no pain    Objective:  Vital signs in last 24 hours:  Temp:  [97.9 F (36.6 C)] 97.9 F (36.6 C)  Heart Rate:  [73-90] 90  Resp Rate:  [18] 18  BP: (142-175)/(60-79) 158/60 mmHg    Labs:    Recent Labs  Lab 03/22/14  0520 03/19/14  0605 03/18/14  0950   GLUCOSE 118* 99 144*   BUN 35* 36* 35*   CREATININE 1.9* 2.1* 2.0*   CALCIUM 9.0 8.9 8.9   SODIUM 139 143 139   POTASSIUM 4.9 5.0 5.3*   CHLORIDE 108 109 107   CO2 22 25 24    ALBUMIN 2.8* 2.8* 2.9*   PHOSPHORUS 3.8 3.7 4.0       Recent Labs  Lab 03/22/14  0520 03/19/14  0605 03/17/14  0510    WBC 4.95 5.16 6.47   HEMOGLOBIN 9.0* 8.9* 8.8*   HEMATOCRIT 30.2* 29.7* 29.8*   MCV 88.6 88.9 89.5   MCH, POC 26.4* 26.6* 26.4*   MCHC 29.8* 30.0* 29.5*   RDW 21* 22* 22*   MPV 9.8 9.3* 9.7   PLATELETS 276 271 287       Weight:  Wt Readings from Last 4 Encounters:   03/14/14 70.852 kg (156 lb 3.2 oz)       Intake/Output:  No intake or output data in the 24 hours ending 03/22/14 1328    Vital Signs:  Patient Vitals for the past 24 hrs:   BP Temp Temp src Pulse Resp SpO2   03/22/14 0858 158/60 mmHg - - 90 - -   03/22/14 0524 171/66 mmHg - - 79 - 95 %   03/22/14 0523 171/63 mmHg 97.9 F (36.6 C) Oral 78 18 97 %   03/21/14 2145 142/61 mmHg - - 76 - -   03/21/14 1955  168/65 mmHg - Oral 83 - 97 %   03/21/14 1954 175/79 mmHg 97.9 F (36.6 C) Oral 84 18 94 %   03/21/14 1343 143/70 mmHg - - 73 - -       Physical Exam:    General appearance and mental status - in no distress  Eyes - pupils equal, sclera clear  Neck - supple  Chest - clear to auscultation, no wheezes, rales or rhonchi  Heart - regular rate and rhythm, no murmurs or rubs  Abdomen - soft, nontender, no masses or organomegaly  Extremities - no edema  Skin - no rashes    Verlene Mayer, MD  [facility] [employer].  [phone]

## 2014-03-22 NOTE — Rehab Progress Note ([facility]) (Signed)
[name]  MRN: [id number]  Account: [id number]  Session Start: 03/22/2014 12:00:00 AM  Session Stop: 03/22/2014 12:00:00 AM    Rehabilitation Nursing  Inpatient Rehabilitation Shift Assessment    Rehab Diagnosis: Debility secondary to NSTEMI and multivessel CAD S/P CABG  Demographics:            Age: 64Y            Gender: Female  Primary Language: English    Date of Onset:  02/26/14  Date of Admission: 03/14/2014 6:35:00 PM    Rehabilitation Precautions Restrictions:   Falls, Infection, sternal    Patient Report: "Okay"  Pain: Patient currently has pain.  Location: Right leg  Type: Acute  Quality: Aching.  Pain Scale: Numeric.  Patient reports a pain level of 6 out of 10.  Patient's acceptable level of pain 3 out of 10.   Interferes with sleep.  Pain is alleviated by: Med  Pain is exacerbated by: Movement Patient medicated.  Pain Reassessment: Response to Pain Intervention: Resting  Post Intervention Pain Quality:  Tender.  Patient Reports Post Intervention Pain Level of: 2 out of 10  Pain Acceptable: Yes  Patient/Caregiver Goals:  To go home and live normally    NEURO  Orientation/Awareness: Alert and Oriented x3.  Speech: Clear.  Behavior: Cooperative.    MEDICATIONS  IV Access: No IV access.  Dialysis Access: Patient does not have dialysis access.      Elopement Risk Level Assessment Tool  Patient Criteria: Patient is not capable of leaving the unit.  Assessment is not  applicable.      RISK ASSESSMENT FOR FALLS/INJURY    MENTAL STATUS CRITERIA:   0 - None identified.  MENTAL STATUS TOTAL: 0    AGE CRITERIA:   23 - < 1 years old  AGE TOTAL: 0    ELIMINATION CRITERIA:   3 - Toileting with Assistance.   ELIMINATION TOTAL: 3    HISTORY OF FALLS CRITERIA:   2 - Unknown History.  HISTORY OF FALLS TOTAL: 2    MEDICATIONS CRITERIA:   2 or more High Risk Medications (*see list below)   MEDICATIONS TOTAL: 2    PHYSICAL MOBILITY CRITERIA:   3 - Decreased balance reaction.   1 - Weakness/impaired physical mobility.    PHYSICAL MOBILITY TOTAL: 4    FALLS RISK ASSESSMENT TOTAL: 11    Patient's Fall Risk: TOTAL SCORE >10: High Risk    Falls Interventions: Clutter removed and clear path to BR.  Call bell, phone, glasses, etc within reach.  Hourly toileting/safety checks between 6am and 10pm, then every 2 hours.  Yellow "high risk" patient identification in place: wrist band, socks, chart  sticker, door sign.  Assistive devices at [facility].  Reoriented PRN.    NUTRITION  Diet:  Type: Consistent carbohydrate.  Food Consistency: Regular.  Liquid Consistency: Thin.      CARDIOVASCULAR     Bilateral lower extremities  Nail Bed Color: Pink.   Right ankle 2+. Elevated while in bed  Pulses:   Apical Pulse: Regular. Strong. Rate is 83 .   Patient does not have a pacemaker.   Patient does not have a defibrillator.    CARDIOPULMONARY  Lung Sounds:   Upper lobes. Clear.   Lower lobes. Clear.  Type of Respirations: Regular.  Cough: No cough noted.  Respiratory Support: The patient does not require any respiratory support.  Respiratory Equipment: None. O2 sat 97%    INTEGUMENTARY  Skin:  Temperature:  Warm  Turgor: Normal for age  Moisture: Dry  Color of skin: Normal for Race/Ethnicity  Capillary Refill: Less than 3 seconds  Wounds/Incisions:     Wound/incision not assessed this shift.   Pt refused drsg change. Stated changed late in the after. CDI  Braden Scale for Predicting Pressure Sore Risk: Sensory Perception: No  impairment  Moisture: Rarely moist  Activity: Chairfast  Mobility: Slightly limited  Nutrition: Adequate  Friction and Shear: No apparent problem  Braden Score: 19  Level of Risk: No risk (19-23). Will reassess every shift.    GASTROINTESTINAL  Abdomen: Soft. Nontender.  Bowel Sounds:  Active bowel sounds audible in all four quadrants.  Date of Last Bowel Movement:  03/21/14   No Problems/Complaints with Bowel Elimination Assessed.    GENITOURINARY  Current Bladder Pattern: Continent  Color:  Yellow   Patient denies problems with urination  and/or catheter.    MUSCULOSKELETAL  Upper Body: sternal precaution, no blood draw, PICC lines, IV on left upper  extremity  Lower Body: diabetic ulcer on Rt. ankle and foot    Functional Measures      TOILETING: Toileting Score = 5.  Patient is supervision/set-up for toileting,  requiring: Stand by assistance.  Patient requires the following assistive device(s):  Grab bar.    BLADDER MANAGEMENT - LEVEL OF ASSIST: Bladder Score = 7. Patient is completely  independent for bladder management. There are no activity limitations.    BLADDER ACCIDENTS THIS SHIFT:  0 . Patient has not had an accident this  assessment and did not require medications or devices.    BOWEL MANAGEMENT - LEVEL OF ASSIST: Bowel Score = 7.  Patient is completely  independent for bowel management.  Patient did not have bowel movement. No  medication/intervention was provided.    BOWEL ACCIDENTS THIS SHIFT: 0 . Patient has not had an accident and did not  require medications or devices.    Education Provided:    Education Provided: Precautions. Pain management. Pain scale. Plan of care.  Safety issues and interventions. Fall protocol. Medication. Name and dosage.  Administration. Purpose. Side Effects.       Audience: Patient.       Mode: Explanation.       Response: Verbalized understanding.    Discharge: Patient is not being discharged at this time.    Long Term Goals:  1. Patient will be able to demonstrate skill in wound care,  dressing changes and skin monitoring with out queing. - Goal Not Met   2. Patient will perform wound dressing changes independently. - Goal Not Met   3. Patient will demonstrate safe transfers from bed to wheelchair, bed to chair  and and bed to bsc using sternal precautions.   4. Patient will communicate her understandfing of s/s of  wound infection and  when to call the doctor.   5. Patient will demonstrate her understanding of DM and how to monitor BS, adm  insulin, and s/s of hypo and hyperglycemia. - Goal Met  Jan  11th (2 weeks)  Short Term Goals:  1. Patient will be able to call 100 percent of the time for  help when getting out of bed. She will communicate her understanding for  assistance. - Goal Met   2. Patient will be able to, at the end of the week demonstrate good hand  washing techniques, and wound care for lower right extremity with reach bar if  needed. - Goal Not Met  January 4th,  one week    PROGRESS TOWARD GOALS: Goals are progressing    PLAN: Nursing Specific Interventions  Pain Management. Skin Management. Wound Management.  Continue with the current Nursing Plan of Care.    TEAM CARE PLAN  Identified problems from team documentation:  Problem: Impaired Endocrine/Metabolic Function  Endocrine: Primary Team Goal: Patient will demonstrate understanding of DM  management to include adm of insulin, s/s of hypo/ hyper glycemia./Active    Problem: Impaired Mobility  Mobility: Primary Team Goal: Patient will perform household functional mobility  mod I using LRAD in order to ensure safe d/c home./Active    Problem: Impaired Self-care Mgmt/ADL/IADL  Self Care: Primary Team Goal: Pt will be Mod I in ADL and functional mobility  and adhere to sternal precautions 100% of the time with no cuing./Active    Problem: Impaired Skin/Wound Mgmt  Skin Wound: Primary Team Goal: Patient and caregiver will be independent of  wound care/Active    Add/Update Problems from this assessment:  No updates at this time.    Please review Integrated Patient View Care Plan Flowsheet for Team identified  Problems, Interventions, and Goals.    Signed by: Jacqualyn Posey, RN 03/22/2014 1:06:00 AM

## 2014-03-22 NOTE — Rehab Progress Note ([facility]) (Signed)
NAMEINNOCENCE SCHLOTZHAUER  MRN: [id number]  Account: [id number]  Session Start: 03/22/2014 12:00:00 AM  Session Stop: 03/22/2014 12:00:00 AM    SEVERE SEPSIS SCREEN  INFECTION:  Patient has no indication of infection.  Negative Sepsis Screen.  If you are unable to assess a system's dysfunction because you do not have labs,  or the labs you have are not current (within 24 hours), call physician and  request and order for the lab tests needed.    Signed by: Ginger Carne, RN 03/22/2014 11:30:00 AM

## 2014-03-22 NOTE — Consults (Signed)
Reason for Consultation:  POA Arterial right foot ulcer      History of Present illness:  [name] is a 64 year old female patient previously seen by the Wound Nurse for evaluation of her arterial foot ulcer on 03/15/2014.  Asked to see patient today by Dr. Antony Contras secondary to wound odor and need for further evaluation.  Patient has been treated with Thera-honey for the last week.  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      Wound assessment:  Bed of ulcer noted to have desiccated tendon and bone in superior section and small amount of bone noted in inferior section.  Skin bridge between sections noted to be slightly smaller than last assessment due to periwound maceration.  Ulcer measures as 11.0 x 5.0 x 0.5 cm with small amount of pink granular tissue, mostly fibrin and desiccated tissue noted, no bleeding noted with cleansing.  Wound cleansed with normal saline. Periwound skin and lower leg hydrated with vaseline.  Iodosorb ointment applied to ulcer followed by dry 4x4, kerlix and medipore tape.  ACE wrap left off due to edema noted above the dressing and known severe PAD.  Dressing secured in place with non-skid sock.  Dr. Antony Contras called regarding the potential for consulting Podiatrist from [facility] to oversee management of ulcer.  Dr. Selena Batten in agreement to bring [facility] on board for ulcer.      Plan:  Monitor and document changes  Hydrated Periwound skin and lower leg by applying vaseline.  Apply Iodosorb ointment to ulcer followed by dry 4x4, kerlix and medipore tape daily.  Please leave ACE wrap off due to edema and known severe PAD.  Dressing secured in place with non-skid sock.  [facility]- Consult sent    [facility] Wound Care Nurse, [phone]

## 2014-03-22 NOTE — Rehab Progress Note ([facility]) (Addendum)
Corrected 03/22/2014 9:16:54 PM    NAME: [name]  MRN: [id number]  Account: [id number]  Session Start: 03/21/2014 12:00:00 AM  Session Stop: 03/21/2014 12:00:00 AM    Rehabilitation Nursing  Inpatient Rehabilitation Shift Assessment    Rehab Diagnosis: Debility secondary to NSTEMI and multivessel CAD S/P CABG  Demographics:            Age: 64Y            Gender: Female  Primary Language: English    Date of Onset:  02/26/14  Date of Admission: 03/14/2014 6:35:00 PM    Rehabilitation Precautions Restrictions:   Falls, Infection, sternal    Patient Report: I am OK  Pain: Patient currently has pain.  Location: on the incision sternal site  Type: Acute  Quality: Aching.  Pain Scale: Numeric.  Patient reports a pain level of 5 out of 10.  Patient's acceptable level of pain 0 out of 10.   Interferes with physical activity.  Pain is alleviated by: oral percocet taken once  Pain is exacerbated by: mobility Repositioned patient. Patient medicated.  Pain Reassessment: Pain was not reassessed as no pain was reported.  Patient/Caregiver Goals:  To go home and live normally    NEURO  Orientation/Awareness: Alert and Oriented x3.  Speech: No deficits noted at this time.  Behavior: Cooperative.    MEDICATIONS  IV Access: No IV access.  Dialysis Access: Patient does not have dialysis access.      Elopement Risk Level Assessment Tool  Patient Criteria: Patient is not capable of leaving the unit.  Assessment is not  applicable.      RISK ASSESSMENT FOR FALLS/INJURY    MENTAL STATUS CRITERIA:   0 - None identified.  MENTAL STATUS TOTAL: 0    AGE CRITERIA:   34 - < 60 years old  AGE TOTAL: 0    ELIMINATION CRITERIA:   3 - Toileting with Assistance.   ELIMINATION TOTAL: 3    HISTORY OF FALLS CRITERIA:   2 - Unknown History.  HISTORY OF FALLS TOTAL: 2    MEDICATIONS CRITERIA:   2 or more High Risk Medications (*see list below)   MEDICATIONS TOTAL: 2    PHYSICAL MOBILITY CRITERIA:   3 - Decreased balance reaction.   1 - Weakness/impaired  physical mobility.   PHYSICAL MOBILITY TOTAL: 4    FALLS RISK ASSESSMENT TOTAL: 11    Patient's Fall Risk: TOTAL SCORE >10: High Risk    Falls Interventions: Clutter removed and clear path to BR.  Call bell, phone, glasses, etc within reach.  Hourly toileting/safety checks between 6am and 10pm, then every 2 hours.  Pt and family education.  Assistive devices at [facility].  Pharmacy review of meds.    NUTRITION  Diet:  Type: Consistent carbohydrate.  Food Consistency: Regular.  Liquid Consistency: Thin.      CARDIOVASCULAR     Bilateral lower extremities  Nail Bed Color: Pink.   RLE 1 + edema floated with pillow when in bed.  Pulses:   Apical Pulse: Regular. Weak. Rate is 79 .   Patient does not have a pacemaker.   Patient does not have a defibrillator.    CARDIOPULMONARY  Lung Sounds:   Upper lobes. Clear.   Lower lobes. Clear.  Type of Respirations: Regular.  Cough: No cough noted.  Respiratory Support: The patient does not require any respiratory support.  Respiratory Equipment: None.    INTEGUMENTARY  Skin:  Temperature: Warm  Turgor: Elastic  Moisture: Dry  Color of skin: Normal for Race/Ethnicity  Capillary Refill: Less than 3 seconds  Wounds/Incisions:       Pt has her right lower leg medial aspect of foot , due to site drainage with  odor wound care consulted , follow up update wound culture to be sent.  Wound:     Surgical Incision: Thoracic incision. Length: 17 centimeter(s) with glue. No  signs of infection.  Drainage: Incision without drainage.  Odor:  No  Incision Care: Per protocol.       upper abdominal old drainage sites x3 no signs of infection OTA.  Braden Scale for Predicting Pressure Sore Risk: Sensory Perception: No  impairment  Moisture: Rarely moist  Activity: Bedfast  Mobility: Slightly limited  Nutrition: Adequate  Friction and Shear: Potential problem  Braden Score: 17  Level of Risk: At risk (15-18)   Pressure Relief every 30 minutes while in wheelchair   Turn patient every 2 hours   Wound care  consult    GASTROINTESTINAL  Abdomen: Soft.  Bowel Sounds:  Active bowel sounds audible in all four quadrants.  Date of Last Bowel Movement:  03/21/2014   No Problems/Complaints with Bowel Elimination Assessed.    GENITOURINARY  Current Bladder Pattern: Continent  Color:   Patient denies problems with urination and/or catheter.    MUSCULOSKELETAL  Upper Body: sternal precaution  Lower Body: diabetic ulcer on Rt. ankle and foot    Functional Measures      BLADDER MANAGEMENT - LEVEL OF ASSIST: Bladder Score = 7. Patient is completely  independent for bladder management. There are no activity limitations.    BLADDER ACCIDENTS THIS SHIFT:  0 . Patient has not had an accident this  assessment and did not require medications or devices.    BOWEL MANAGEMENT - LEVEL OF ASSIST: Bowel Score = 7.  Patient is completely  independent for bowel management.  Patient did not have bowel movement. No  medication/intervention was provided. pt refused the Miralax    BOWEL ACCIDENTS THIS SHIFT: 0 . Patient has not had an accident and did not  require medications or devices.    COMPREHENSION: Both ( auditory and visual) modes of comprehension are used  equally. Patient does not comprehend complex/abstract information in their  primary language without assistance from a helper. Comprehension Score = 2,  Maximal Prompting. Patient comprehends basic daily needs 25-49% of the time.  Patient understands simple information via single words or gestures. Requires  maximal/a lot of prompting (most of the time).  Patient has the following assistive device(s) or limitations: No assistive  devices were required.    EXPRESSION: Vocal expression is the usual mode. Patient does not express  complex/abstract information in their primary language without a helper.  Expression Score = 5, Stand By Prompting. Patient expresses basic daily needs or  ideas without prompting.  Patient has the following assistive device(s) or limitations: No assistive  devices  were required    Education Provided:    Education Provided: Precautions. Pain management. Medication options. Side  effects. Plan of care. Nutrition/feeding. Dietary recommendations. Wound  healing. Skin/wound care. Signs/symptoms of infection. Role of nutrition in  wound prevention/healing. Critical pressure areas. Prevention of skin breakdown.  Medication. Name and dosage. Administration. Purpose. Side Effects. Interaction.  Administration of subcutaneous Heparin injections.       Audience: Patient.       Mode: Explanation.  Demonstration.       Response: Applied knowledge.  Verbalized understanding.  Demonstrated skill.  Needs practice.  Needs reinforcement.    Discharge: Patient is not being discharged at this time.    Long Term Goals:  1. Patient will be able to demonstrate skill in wound care,  dressing changes and skin monitoring with out queing. - Goal Not Met   2. Patient will perform wound dressing changes independently. - Goal Not Met   3. Patient will demonstrate safe transfers from bed to wheelchair, bed to chair  and and bed to bsc using sternal precautions.   4. Patient will communicate her understandfing of s/s of  wound infection and  when to call the doctor.   5. Patient will demonstrate her understanding of DM and how to monitor BS, adm  insulin, and s/s of hypo and hyperglycemia. - Goal Met  Jan 11th (2 weeks)  Short Term Goals:  1. Patient will be able to call 100 percent of the time for  help when getting out of bed. She will communicate her understanding for  assistance. - Goal Met   2. Patient will be able to, at the end of the week demonstrate good hand  washing techniques, and wound care for lower right extremity with reach bar if  needed. - Goal Not Met  January 4th, one week    PROGRESS TOWARD GOALS: Pt has right lower foot ulcer with drainage and odor .  Dressing changed this shift needed to be changed twice a day. VS stable no signs  of fever . Wound care reconsult to address to the  doctor. Accucheck with insulin  coverage and DM teaching started . Pt took percocet once for pain relief  after  therapy session as preferred by pt.    PLAN: Nursing Specific Interventions  Pain Management. Skin Management. Wound Management.  Continue with the current Nursing Plan of Care.    TEAM CARE PLAN  Identified problems from team documentation:  Problem: Impaired Endocrine/Metabolic Function  Endocrine: Primary Team Goal: Patient will demonstrate understanding of DM  management to include adm of insulin, s/s of hypo/ hyper glycemia./Active    Problem: Impaired Mobility  Mobility: Primary Team Goal: Patient will perform household functional mobility  mod I using LRAD in order to ensure safe d/c home./Active    Problem: Impaired Psychosocial Skills/Behavior  PsychoSocial: Primary Team Goal: Pt will demonstrate coping skills for adjusting  to post-cardiac surgery recovery as reflected in positive expectations for  continuing to increase levels of functional independence/Active    Problem: Impaired Self-care Mgmt/ADL/IADL  Self Care: Primary Team Goal: Pt will be Mod I in ADL and functional mobility  and adhere to sternal precautions 100% of the time with no cuing./Active    Problem: Impaired Skin/Wound Mgmt  Skin Wound: Primary Team Goal: Patient and caregiver will be independent of  wound care/Active    Add/Update Problems from this assessment:  No updates at this time.    Please review Integrated Patient View Care Plan Flowsheet for Team identified  Problems, Interventions, and Goals.    Signed by: Ginger Carne, RN 03/21/2014 11:30:00 AM

## 2014-03-22 NOTE — Rehab Progress Note ([facility]) (Signed)
[name]  MRN: [id number]  Account: [id number]  Session Start: 03/22/2014 11:00:00 AM  Session Stop: 03/22/2014 12:00:00 PM    Occupational Therapy  Inpatient Rehabilitation Progress Note - Brief    Rehab Diagnosis: Cardiac debility  Demographics:            Age: 42Y            Gender: Female  Rehabilitation Precautions/Restrictions:   Falls, Infection, sternal    SUBJECTIVE  Patient Report: I'm feeling tired  Pain: Patient currently has pain.  Patient reports a pain level of 8 out of 10. Repositioned patient.    OBJECTIVE  General Observation: patient up in w/c, agreeable to therapy. Patient noted with  elevated blood sugar at time of tx, per clin tech. RN notified and aware,  cleared patient to work with therapy.    Interventions:       Self Care/Home Management:  Initially OT treatment focused on home safety  education and fall prevention education 2/2 deficits in balance, endurance.  Patient ambulated with RW approx50'/80' with SPV. Patient able to self-monitor need  for standing rest breaks. Patient engaged in laundry task to load/unload  front-load washing machine using safe techniques 2/2 sternal precautions.  Patient able to demo. use of reacher to obtain single clothing items from  machine w/use of safe technique and no LOB. Patient utilized reacher to pick up  items from floor within sternal precautions following initial demo/instruction.  Returned to room at conclusion of tx with all needs met.      Education Provided:    Education Provided: Precautions. Plan of care. Activities of daily living.  Equipment.       Audience: Patient.       Mode: Explanation.  Demonstration.       Response: Applied knowledge.  Verbalized understanding.  Demonstrated skill.  Needs practice.  Needs reinforcement.    ASSESSMENT  Patient continues to present with gross deficits in balance and endurance  impacting full independence and safety with ADL/IADL tasks. Patient will benefit  from continued education/training with  energy conservation techniques, training  with safe techniques to complete ADL 2/2 sternal precautions and RLE wounds,  fall prevention 2/2 balance impairment, home safety.    PLAN  Continued Occupational Therapy is recommended.  Recommended Frequency/Duration/Intensity: 60-120 minutes, 5-6x week for 7-10  days from initial eval on 03/15/14  Continued Activities Contributing Toward Care Plan: ADL/IADL retraining, AE  training, sternal precautions, TherEx, TherAct, d/c planning    3 Hour Rule Minutes: 60 minutes of OT treatment this session count towards  intensity and duration of therapy requirement. Patient was seen for the full  scheduled time of OT treatment this session.  Therapy Mode Minutes: Individual: 60 minutes.    Signed by: Montez Hageman, OTR/L 03/22/2014 12:00:00 PM

## 2014-03-22 NOTE — Rehab Progress Note ([facility]) (Signed)
NAMEMARIKO [name]  MRN: [id number]  Account: [id number]  Session Start: 03/22/2014 12:00:00 AM  Session Stop: 03/22/2014 12:00:00 AM    Nutrition  Inpatient Rehabilitation Follow Up    Rehab Diagnosis: Cardiac debility  Demographics:            Age: 62Y            Gender: Female    Rehabilitation Precautions/Restrictions:   Falls, Infection, sternal  Medications and Allergies: Significant rehabilitation considerations:   Please refer to EPIC    SUBJECTIVE  Patient Reports: I'm doing fine  Patient/Caregiver Goals:  To go home and live normally    Current Medical Nutrition Therapy: Diet: Consistent Carbohydrate Calories.  Renal.   diet with no liquid consistency restrictions.    % of Meals Consumed:  75 %  Labs Noted:   Glucose: 116-243  (75-110)   Albumin: 2.8  (3.5-5.0)   Results for Madison, [name] (MRN [id number]) as of 03/22/2014 14:32    03/22/2014 05:20  BUN: 35 (H)  Creatinine: 1.9 (H)  Current Problems/GI Symptoms: No Current GI Problems Noted.    Assessment of Nutrition Status:  Nutritional Risk Assessment:  No nutritional risk present.  Nutrition Diagnosis:   NC-2.2 Altered Nutrition Related Laboratory Values: continues .  Current Nutrition Intake: Adequate.  Current Nutrition Status: Adequate.    Interventions:   Assessment Completed.   Nutritional Adequacy Assessed:   Tolerance to Current Nutrition Therapy Assessed:    Education Provided:  No education provided this session. Pt denies need for  education.    ASSESSMENT    Assessment of Nutritional Status: Rec: continue same.    Progress Towards Goals: Pt eating 75%, meeting needs.    PLAN  Recommendations for Follow-up: Will continue to monitor po intake and labs  within 14 days.    Care Plan  Identified problems from team documentation:  Problem: Impaired Endocrine/Metabolic Function  Endocrine: Primary Team Goal: Patient will demonstrate understanding of DM  management to include adm of insulin, s/s of hypo/ hyper glycemia./Active    Problem: Impaired  Mobility  Mobility: Primary Team Goal: Patient will perform household functional mobility  mod I using LRAD in order to ensure safe d/c home./Active    Problem: Impaired Psychosocial Skills/Behavior  PsychoSocial: Primary Team Goal: Pt will demonstrate coping skills for adjusting  to post-cardiac surgery recovery as reflected in positive expectations for  continuing to increase levels of functional independence/Active    Problem: Impaired Self-care Mgmt/ADL/IADL  Self Care: Primary Team Goal: Pt will be Mod I in ADL and functional mobility  and adhere to sternal precautions 100% of the time with no cuing./Active    Problem: Impaired Skin/Wound Mgmt  Skin Wound: Primary Team Goal: Patient and caregiver will be independent of  wound care/Active    Add/Update Problems from this Treatment:  No updates at this time.    Please review Integrated Patient View Care Plan Flowsheet for Team identified  Problems, Interventions, and Goals.    Signed by: Leeanne Deed, RD 03/22/2014 2:50:00 PM

## 2014-03-22 NOTE — Rehab Progress Note ([facility]) (Signed)
[name]  MRN: [id number]  Account: [id number]  Session Start: 03/22/2014 1:00:00 PM  Session Stop: 03/22/2014 2:00:00 PM    Occupational Therapy  Inpatient Rehabilitation Progress Note - Brief    Rehab Diagnosis: Cardiac debility  Demographics:            Age: 18Y            Gender: Female  Rehabilitation Precautions/Restrictions:   Falls, Infection, sternal    SUBJECTIVE  Pain: Patient currently has pain.  Patient reports a pain level of 6 out of 10. premedicated for therapy    OBJECTIVE  Interventions:       Therapeutic Activities:  OT treatment addressed safety and equipment use  during functional mobility tasks in the home. Pt propelled wheelchair greater  than household distances with BLE to improve overall conditioning. Practiced tub  transfer using tub transfer bench with mod I. Performed household distance  ambulation with RW with supervision to enable pt to access her bathroom.  Practiced high/low item retrieval with reacher in standing using RW. Pt left  seated in wheelchair, hand off to PT.  Pain Reassessment: Pt reports 8/10 right LE pain, rest break given, denies need  for further intervention    Education Provided:    Education Provided: Precautions. Functional transfers. Equipment. Safety.       Audience: Patient.       Mode: Explanation.  Demonstration.       Response: Needs practice.  Needs reinforcement.    ASSESSMENT  Pt continues to present with deficits in balance and standing tolerance  impacting functional mobility. She benefits from the use of a tub transfer  bench, reports her daughter has ordered one for home use. Continue OT POC.    PLAN  Continued Occupational Therapy is recommended.  Recommended Frequency/Duration/Intensity: 60-120 minutes, 5-6x week for 7-10  days from initial eval on 03/15/14  Continued Activities Contributing Toward Care Plan: ADL/IADL retraining, AE  training, sternal precautions, TherEx, TherAct, d/c planning    3 Hour Rule Minutes: 60 minutes of OT treatment  this session count towards  intensity and duration of therapy requirement. Patient was seen for the full  scheduled time of OT treatment this session.  Therapy Mode Minutes: Individual: 60 minutes.    Signed by: Fransisco Beau, OTR/L 03/22/2014 2:00:00 PM

## 2014-03-22 NOTE — Rehab Progress Note ([facility]) (Signed)
NAMELEONTINA SKIDMORE  MRN: [id number]  Account: [id number]  Session Start: 03/22/2014 12:00:00 AM  Session Stop: 03/22/2014 12:00:00 AM    Rehabilitation Nursing  Inpatient Rehabilitation Shift Assessment    Rehab Diagnosis: Cardiac debilityDemographics:            Age: 64Y            Gender: Female  Primary Language: English    Date of Onset:  02/26/14  Date of Admission: 03/14/2014 6:35:00 PM    Rehabilitation Precautions Restrictions:   Falls, Infection, sternal    Patient Report: I have pain on the right lower leg  Pain: Patient currently has pain.  Location: Right foot  Type: Acute  Quality: Aching.  Pain Scale: Numeric.  Patient reports a pain level of 5 out of 10.  Patient's acceptable level of pain 0 out of 10.   Interferes with physical activity.  Pain is alleviated by: oral pain medication  Pain is exacerbated by: Patient medicated. Repositioned patient.  Pain Reassessment: Pain was not reassessed as no pain was reported.  Patient/Caregiver Goals:  To go home and live normally    NEURO  Orientation/Awareness: Alert and Oriented x3.  Speech: No deficits noted at this time.  Behavior: Cooperative.    MEDICATIONS  IV Access: Patient has IV access.     Type: Saline Lock  IV Gauge: 22  Location: right AC  Care Provided: Flushed after medication with 10ml normal saline.   Date Inserted:   03/22/2014  Dialysis Access: Patient does not have dialysis access.      Elopement Risk Level Assessment Tool  Patient Criteria: Patient is not capable of leaving the unit.  Assessment is not  applicable.      RISK ASSESSMENT FOR FALLS/INJURY    MENTAL STATUS CRITERIA:   0 - None identified.  MENTAL STATUS TOTAL: 0    AGE CRITERIA:   65 - < 23 years old  AGE TOTAL: 0    ELIMINATION CRITERIA:   3 - Toileting with Assistance.   ELIMINATION TOTAL: 3    HISTORY OF FALLS CRITERIA:   2 - Unknown History.  HISTORY OF FALLS TOTAL: 2    MEDICATIONS CRITERIA:   2 or more High Risk Medications (*see list below)   Change of medication and/or dosage in  the past 5 days.   MEDICATIONS TOTAL: 3    PHYSICAL MOBILITY CRITERIA:   3 - Decreased balance reaction.   1 - Weakness/impaired physical mobility.   PHYSICAL MOBILITY TOTAL: 4    FALLS RISK ASSESSMENT TOTAL: 12    Patient's Fall Risk: TOTAL SCORE >10: High Risk    Falls Interventions: Clutter removed and clear path to BR.  Call bell, phone, glasses, etc within reach.  Hourly toileting/safety checks between 6am and 10pm, then every 2 hours.  Yellow "high risk" patient identification in place: wrist band, socks, chart  sticker, door sign.  Pt and family education.  Assistive devices at [facility].  Pharmacy review of meds.  Bed alarm    NUTRITION  Diet:  Type: Consistent carbohydrate.  Food Consistency: Regular.  Liquid Consistency: Thin.      CARDIOVASCULAR     Bilateral lower extremities  Nail Bed Color: Pink.   Right foot  Pulses:   Apical Pulse: Regular. Strong. Rate is 80 .   Patient does not have a pacemaker.   Patient does not have a defibrillator.    CARDIOPULMONARY  Lung Sounds:   Upper lobes. Clear.   Lower  lobes. Clear.  Type of Respirations: Regular.  Cough: No cough noted.  Respiratory Support: The patient does not require any respiratory support.  Respiratory Equipment: None.    INTEGUMENTARY  Skin:  Temperature: Warm  Turgor: Elastic  Moisture: Dry  Color of skin: Normal for Race/Ethnicity  Capillary Refill: Less than 3 seconds  Wounds/Incisions:       Right foot wound dressing changed today by consult wound care due to odor and  draiange . Wound culture sent. No ace wrapping use only kerlix  Wound:     Surgical Incision: Thoracic incision. Length: 17 centimeter(s) with glue. No  signs of infection.  Drainage: Incision without drainage.  Odor:  No  Incision Care: Per protocol.       upper abdominal old drainage sites all healed .Pt has itchyness benadryl cream  ordered.    Braden Scale for Predicting Pressure Sore Risk: Sensory Perception: No  impairment  Moisture: Rarely moist  Activity: Chairfast  Mobility:  No limitation  Nutrition: Adequate  Friction and Shear: Potential problem  Braden Score: 19  Level of Risk: No risk (19-23). Will reassess every shift.    GASTROINTESTINAL  Abdomen: Soft.  Bowel Sounds:  Active bowel sounds audible in all four quadrants.  Date of Last Bowel Movement:  1/4   No Problems/Complaints with Bowel Elimination Assessed.    GENITOURINARY  Current Bladder Pattern: Continent  Color:  Yellow   Patient denies problems with urination and/or catheter.    MUSCULOSKELETAL  Upper Body: sternal precaution,  Lower Body: diabetic ulcer on Rt. ankle and foot    Functional Measures      BLADDER MANAGEMENT - LEVEL OF ASSIST: Bladder Score = 7. Patient is completely  independent for bladder management. There are no activity limitations.    BLADDER ACCIDENTS THIS SHIFT:  0 . Patient has not had an accident this  assessment and did not require medications or devices.    BOWEL MANAGEMENT - LEVEL OF ASSIST: Bowel Score = 7.  Patient is completely  independent for bowel management.  Patient did not have bowel movement. No  medication/intervention was provided.    BOWEL ACCIDENTS THIS SHIFT: 0 . Patient has not had an accident and did not  require medications or devices. Pt refused Miralax    Education Provided:    Education Provided: Precautions. Pain management. Medication options. Side  effects. Plan of care. Nutrition/feeding. Skin/wound care. Signs/symptoms of  infection. Role of nutrition in wound prevention/healing. Incision care.  Critical pressure areas. Prevention of skin breakdown. Medication. Cardiac.  Diabetes.       Audience: Patient.       Mode: Explanation.  Demonstration.  Teacher, English as a foreign language provided.       Response: Applied knowledge.  Verbalized understanding.  Demonstrated skill.  Needs practice.  Needs reinforcement.    Discharge: Patient is not being discharged at this time.    Long Term Goals:  1. Patient will be able to demonstrate skill in wound care,  dressing changes and skin monitoring  with out queing. - Goal Not Met   2. Patient will perform wound dressing changes independently. - Goal Not Met   3. Patient will demonstrate safe transfers from bed to wheelchair, bed to chair  and and bed to bsc using sternal precautions.   4. Patient will communicate her understandfing of s/s of  wound infection and  when to call the doctor.   5. Patient will demonstrate her understanding of DM and how to monitor BS, adm  insulin, and s/s of hypo and hyperglycemia. - Goal Met  Jan 11th (2 weeks)  Short Term Goals:  1. Patient will be able to call 100 percent of the time for  help when getting out of bed. She will communicate her understanding for  assistance. - Goal Met   2. Patient will be able to, at the end of the week demonstrate good hand  washing techniques, and wound care for lower right extremity with reach bar if  needed. - Goal Not Met  January 4th, one week    PROGRESS TOWARD GOALS: Pt has iron IV for abnormal lab. IV saline on right arm  no new complain. Right foot assessed dressing changed with new order from wound  care RN and culture was sent follow up. No ace wrapping on site. Pain management  with Percocet one time reliefed pain on the right foot. BP mild elevated with  out any chest internal pian. OBS.    PLAN: Nursing Specific Interventions  Pain Management. Skin Management. Wound Management.  Continue with the current Nursing Plan of Care.    TEAM CARE PLAN  Identified problems from team documentation:  Problem: Impaired Endocrine/Metabolic Function  Endocrine: Primary Team Goal: Patient will demonstrate understanding of DM  management to include adm of insulin, s/s of hypo/ hyper glycemia./Active    Problem: Impaired Mobility  Mobility: Primary Team Goal: Patient will perform household functional mobility  mod I using LRAD in order to ensure safe d/c home./Active    Problem: Impaired Psychosocial Skills/Behavior  PsychoSocial: Primary Team Goal: Pt will demonstrate coping skills for  adjusting  to post-cardiac surgery recovery as reflected in positive expectations for  continuing to increase levels of functional independence/Active    Problem: Impaired Self-care Mgmt/ADL/IADL  Self Care: Primary Team Goal: Pt will be Mod I in ADL and functional mobility  and adhere to sternal precautions 100% of the time with no cuing./Active    Problem: Impaired Skin/Wound Mgmt  Skin Wound: Primary Team Goal: Patient and caregiver will be independent of  wound care/Active    Add/Update Problems from this assessment:  No updates at this time.    Please review Integrated Patient View Care Plan Flowsheet for Team identified  Problems, Interventions, and Goals.    Signed by: Ginger Carne, RN 03/22/2014 7:30:00 PM

## 2014-03-22 NOTE — Rehab Progress Note ([facility]) (Signed)
[name]  MRN: [id number]  Account: [id number]  Session Start: 03/22/2014 2:00:00 PM  Session Stop: 03/22/2014 3:00:00 PM    Physical Therapy  Inpatient Rehabilitation Progress Note - Brief    Rehab Diagnosis: Cardiac debility  Demographics:            Age: 30Y            Gender: Female  Rehabilitation Precautions/Restrictions:   Falls, Infection, sternal    SUBJECTIVE  Pain: Patient currently without complaints of pain.    OBJECTIVE  General Observations: Pt seated in w/c; sternal incision well-healing open to  air; R foot with significant edema covered by dressing and sock.  Vital Signs:                       Current Value                Previous Value  Vitals  Time                 -                            1100  Position/Activity    -                            seated  BP Systolic          -                            171/66  Pulse                -                            79  Respirations         -                            18    Interventions:       Therapeutic Activities:  Session focused on improving safety with mobility.  Pt was able to ambulate 118 feet + 50 feet using RW with supervision. Pt was  able to maintain sternal precautions during gait. Pt ascended and descended a  curb with hand held min assist, x2 reps. Car transfer performed with supervision  in practice vehicle. Pt propelled w/c for 300 feet using feet with supervision.  She was left seated in w/c at bedside with all needs met.  Pain Reassessment: Pain was not reassessed as no pain was reported.    Education Provided:    Education Provided: Activities of daily living. Stair/curb/environmental barrier  negotiation. Car transfers. Functional transfers. Gait.       Audience: Patient.       Mode: Explanation.       Response: Demonstrated skill.  Needs reinforcement.    ASSESSMENT  Mrs. Mehl presents with improved safety and reduced fear with negotiating a  step with hand held assistance. She tolerated ambulation farther distances  this  session, without c/o pain with c/o fatigue. Pt will benefit from continued PT in  order to maximize safety with mobility, to reduce fall risk and and risk for  cardiac complications, and to prepare for safe d/c home.    PLAN  Continued Physical  Therapy is recommended.  Recommended Frequency/Duration/Intensity: 1-2 hours per day, 5-6 days per week  for 2 weeks after initial eval on 03/15/14  Activities Contributing Toward Care Plan: bed mobility, transfer training,  aerobic and endurance training, balance activity, strengthening, procurement of  DME, caregiver training, pt education and training on falls prevention, etc.    3 Hour Rule Minutes: 60 minutes of PT treatment this session count towards  intensity and duration of therapy requirement. Patient was seen for the full  scheduled time of PT treatment this session.  Therapy Mode Minutes: Individual: 60 minutes.    Signed by: Arnoldo Hooker, PT  03/22/2014 3:00:00 PM

## 2014-03-23 LAB — GLUCOSE WHOLE BLOOD - POCT
Whole Blood Glucose POCT: 119 mg/dL — ABNORMAL HIGH (ref 70–100)
Whole Blood Glucose POCT: 168 mg/dL — ABNORMAL HIGH (ref 70–100)
Whole Blood Glucose POCT: 131 mg/dL — ABNORMAL HIGH (ref 70–100)
Whole Blood Glucose POCT: 157 mg/dL — ABNORMAL HIGH (ref 70–100)

## 2014-03-23 MED ORDER — LIDOCAINE 5 % EX PTCH
2.0000 | MEDICATED_PATCH | CUTANEOUS | Status: DC
Start: 2014-03-23 — End: 2014-03-25
  Administered 2014-03-23 – 2014-03-25 (×3): 2 via TRANSDERMAL
  Filled 2014-03-23 (×3): qty 2

## 2014-03-23 MED ORDER — COLLAGENASE 250 UNIT/GM EX OINT
TOPICAL_OINTMENT | CUTANEOUS | Status: DC
Start: 2014-03-23 — End: 2014-03-25
  Filled 2014-03-23: qty 30

## 2014-03-23 NOTE — Progress Notes (Signed)
[facility]    History:    -CKD III, baseline Cr 1.6-1.7 mg/dL  -AKI, hemodynamically mediated  -LE edema  -HTN with CKD   -Anemia with CKD  -3 vessels CAD; s/p cath and CABG   -PVD with right foot ulcer  -metabolic acidosis    Assessment/Plan:    Labs look good with GFR stable. Will watch BP for a few days before considering any further changes.    Scheduled Meds:  Current Facility-Administered Medications   Medication Dose Route Frequency   . amiodarone  200 mg Oral Daily   . amLODIPine  10 mg Oral Daily   . aspirin EC  325 mg Oral Daily   . atorvastatin  10 mg Oral QHS   . calcitRIOL  0.25 mcg Oral Daily   . carvedilol  25 mg Oral Q12H SCH   . collagenase   Topical Q24H   . docusate sodium  100 mg Oral Daily   . furosemide  20 mg Oral Daily   . gabapentin  300 mg Oral BID   . heparin (porcine)  5,000 Units Subcutaneous Q12H [facility]   . hydrALAZINE  100 mg Oral TID   . insulin aspart  3 Units Subcutaneous Once   . iron sucrose  200 mg Intravenous Q24H SCH   . venlafaxine  75 mg Oral BID       Continuous Infusions:     PRN Meds:acetaminophen, albuterol-ipratropium, bisacodyl, dextrose, diphenhydrAMINE, diphenhydrAMINE-zinc acetate, glucagon (rDNA), insulin aspart, magnesium hydroxide, oxyCODONE-acetaminophen, polyethylene glycol, traMADol    Subjective:    Responsive and comfortable  HEENT: no visual complaint, cough, hearing deficit  Lungs: no cough, SOB  CV: No chest pain, palpitations  Abd: No constipation, pain, N&V  Ext: no pain    Objective:  Vital signs in last 24 hours:  Temp:  [98.6 F (37 C)-99.3 F (37.4 C)] 99.3 F (37.4 C)  Heart Rate:  [74-80] 80  BP: (127-156)/(52-80) 156/63 mmHg    Labs:    Recent Labs  Lab 03/22/14  0520 03/19/14  0605 03/18/14  0950   GLUCOSE 118* 99 144*   BUN 35* 36* 35*   CREATININE 1.9* 2.1* 2.0*   CALCIUM 9.0 8.9 8.9   SODIUM 139 143 139   POTASSIUM 4.9 5.0 5.3*   CHLORIDE 108 109 107   CO2 22 25 24    ALBUMIN 2.8* 2.8*  2.9*   PHOSPHORUS 3.8 3.7 4.0       Recent Labs  Lab 03/22/14  0520 03/19/14  0605 03/17/14  0510   WBC 4.95 5.16 6.47   HEMOGLOBIN 9.0* 8.9* 8.8*   HEMATOCRIT 30.2* 29.7* 29.8*   MCV 88.6 88.9 89.5   MCH, POC 26.4* 26.6* 26.4*   MCHC 29.8* 30.0* 29.5*   RDW 21* 22* 22*   MPV 9.8 9.3* 9.7   PLATELETS 276 271 287       Weight:  Wt Readings from Last 4 Encounters:   03/14/14 70.852 kg (156 lb 3.2 oz)       Intake/Output:  No intake or output data in the 24 hours ending 03/23/14 0911    Vital Signs:  Patient Vitals for the past 24 hrs:   BP Temp Temp src Pulse SpO2   03/23/14 0857 156/63 mmHg - - 80 -   03/23/14 0553 153/68 mmHg 99.3 F (37.4 C) - 75 -   03/22/14 2229 140/66 mmHg - - 78 -   03/22/14 2010 127/52 mmHg 98.6 F (  37 C) Oral 74 96 %   03/22/14 1637 150/80 mmHg - - - -       Physical Exam:    General appearance and mental status - in no distress  Eyes - pupils equal, sclera clear  Neck - supple  Chest - clear to auscultation, no wheezes, rales or rhonchi  Heart - regular rate and rhythm, no murmurs or rubs  Abdomen - soft, nontender, no masses or organomegaly  Extremities - no edema  Skin - no rashes    Verlene Mayer, MD  [facility] [employer].  [phone]

## 2014-03-23 NOTE — Rehab Progress Note ([facility]) (Signed)
[name]  MRN: [id number]  Account: [id number]  Session Start: 03/23/2014 12:00:00 AM  Session Stop: 03/23/2014 12:00:00 AM    SEVERE SEPSIS SCREEN  INFECTION:  Patient has no indication of infection.  Negative Sepsis Screen.  If you are unable to assess a system's dysfunction because you do not have labs,  or the labs you have are not current (within 24 hours), call physician and  request and order for the lab tests needed.    Signed by: Coralyn Pear, RN 03/23/2014 8:40:00 AM

## 2014-03-23 NOTE — Rehab Discharge Summary ([facility]) (Addendum)
Corrected 03/23/2014 12:54:10 PM    NAME: [name]  MRN: [id number]  Account: [id number]  Session Start: 03/23/2014 10:00:00 AM  Session Stop: 03/23/2014 11:00:00 AM    Physical Therapy  Inpatient Rehabilitation Discharge Summary and Note    Rehab Diagnosis: Cardiac debility  Demographics:            Age: 64Y            Gender: Female  Primary Language: English  Past Medical History: Diabetes mellitus  Chronic kidney disease  Myocardial infarction  Hypertension  Hyperlipidemia  Peripheral arterial disease 06/28/13 right superficial femoral artery stenosis,  tibial peroneal trunk stenosis, left CVL a stenosis, angioplasty  Coronary artery disease  Left circumflex artery presumed DES, Chronically occluded right coronary artery  stent  Anemia  Arterial leg ulcer 2014, receiving home health  Chronic obstructive pulmonary disease    PAST SURGICAL HISTORY  Cardiac angiography and angioplasty   06/2013  Colonoscopy  Tubal ligation  History of Present Illness: 64 year old female with PMH DM2, PAD, HTN, CKD,  COPD, CAD s/p RCA stent initially admitted [facility] with syncope  02/26/14. Found to have NSTEMI and cardiac cath revealed multivessel CAD.  Transferred [facility] 02/28/14 and subsequently underwent CABG x2 on 12/18. Patient  also with severe PAD with chronic RLE arterial ulcer, s/p angioplasty in 06/2013;  clinically improving  but has not had wound care recently. Prior to hosp  admission patient had not been taking her Spiriva or Symbicort for COPD; resumed  in hospital. She is also being treated for asymptomatic bacteriuria and iron  deficiency anemia. She is DM2 and her Hgb A1C 7.1.   Date of Onset: 02/26/14   Date of Admission: 03/14/2014 6:35:00 PM  Rehabilitation Precautions/Restrictions:   Falls, Infection, sternal    SUBJECTIVE  Patient Report: "My chest hurts so bad."  Pain: Patient currently has pain.    OBJECTIVE  Vital Signs:                       Current Value                Previous Value  Vitals  Time                  -                            1100  Position/Activity    -                            seated  BP Systolic          -                            171/66  Pulse                -                            79  Respirations         -                            18  Bed Mobility: Pt is able to perform bed mobility mod I without bedrail from flat  surface.  Family reports pt's bed is elevated, and were educated to remove  box-spring and add board under mattress to lower height of bed.    Refer to Functional Measures for remainder of functional mobility status.    Functional Measures      TRANSFERS BED/CHAIR/WHEELCHAIR: Bed/chair/wheelchair Transfer Score = 6.  Patient is modified independent for transferring to and from the  bed/chair/wheelchair, requiring: Walker. Pt is mod I for bed mobility and  transfers using RW. She performs car transfer with RW for set up.    LOCOMOTION WHEELCHAIR:   Wheelchair locomotion was observed using a manual wheelchair. Wheelchair  Distance Scale = 3.  Distance traveled in wheelchair is greater than 150 feet.  Wheelchair Score = 5.  Patient is supervision or set up only for propelling  wheelchair, requiring: Stand by assistance. Patient was able to propel a  distance of 300 feet in a wheelchair.  pt uses LEs in order to remain compliant  with sternal precautions.    LOCOMOTION WALK:   Walk Distance Scale = 2.  Distance walked is 50 -149 feet. Walk Score = 2.  Patient is able to walk at least 50 feet (household distance) but requires  assistance. Patient walked a distance of 118 feet. Rolling walker. Pt ambulates  over even surface with RW. Gait characterized by reduced step length at LLE,  hyperextension at right knee throughout gait cycle, reduced ankle DF at right  ankle.    LOCOMOTION STAIRS: Stairs Score = 1.  Incidental assistance with lifting or  lowering, contact guard or steadying was provided. Patient requires/performs 75%  or more of effort and minimal contact assistance with  negotiating stairs.  Patient negotiated 1 stairs.  Patient requires the following assistive device(s):  No assistive devices were  required. Pt requires hand held min assistance for negotiating 1 step necessary  to enter her home. Pt ascends with LLE and descends leading with RLE.    Today's Treatment Interventions:       Therapeutic Activities:  Session focused on improving strength fo LEs and  reassessment of functional mobility as described above. Pt performed supine  therex, led by student physical therapist, including heels slides, bridging,  SLR, hip adduction, etc x10 reps bilaterally for each. Pt given ice pack for  pain relief at pectoral muscles. Pt was left seated in w/c at bedside in prep  for family conference.    Education Provided:    Education Provided: Personal assistant. Rehab techniques and  procedures. ice for pain relief and reducing inflammation. Precautions.       Audience: Patient.       Mode: Explanation.       Response: Needs practice.  Needs reinforcement.    Equipment Provided/Ordered: None at this time.    ASSESSMENT  Summary of Progress and Current Status: Mrs. Pitstick has made significant  improvement in safety and independence with functional mobility since initial  evaluation. She continues to be limited by pain in pectorals near sternal  incision and at right foot ulcer site, ankle DF deficits at right LE, and  resulting balance deficits. She is unable to ambulate independently using a RW,  and is able to utilize manual w/c over even surface independently to manage her  home environment. She requires a high strength light weight manual w/c due to  sternal precautions, LE weakness and pain in right foot, as she is unable to  propel standard or light weight w/c over carpet ( she has carpet in home  environment.) With a high strength, light weight w/c, the pt will be independent  with w/c mobility in her home, and with AD's from w/c level. She will benefit  from one additional  session of PT in order to ensure safety with HEP and to  prepare pt and family for safe d/c home. She will benefit from followup PT in  the home setting in order to address remaining deficits, to reduce fall risk and  risk for cardiac complications, and to prepare for safe transition to cardiac  rehab.    Previously Documented Mode of Locomotion at Discharge: Walk  PAI Expected Mode of Locomotion at Discharge: The patient's performance warrants  a change to the current documented expected, most frequently used mode of  locomotion at discharge The expected mode of most frequently used locomotion, at  discharge, is expected to be both walking and wheelchair.    Progress Toward Goals (final status):   SHORT TERM GOAL REVIEW:       1. Pt will correctly identify sternal precautions with 100% accuracy,  without cuing. - Met       2. Pt will ambulate 50 feet with supervision. - Met       3. Pt will identify caregiver in order to ensure safe d/c home. - Met   LONG TERM GOAL REVIEW:       1. The pt will perform all bed mobility mod I, from flat surface and  without use of bedrails in order to ensure safe use of standard bed at home. -  Met       2. Pt will transfer bed to and from chair mod I using LRAD in order to  ensure safe transfers at home. - Met       3. Pt will ambulate at least 50 feet mod I using LRAD, in order to ensure  unrestricted access to home environment. - Met       4. Pt will negotiate 1 standard stair using LRAD in order to ensure safe  entry and exiting her home environment. - Met       5. Pt will perform car transfer with supervision in order to ensure safe  transport home. - Met       6. Caregiver will demonstrate safe guarding and assistance techniques in  order to ensure safe d/c home.  - Not Met: Pt's son did not attend planned  family training session.    PLAN  Physical Therapy Plan: Patient is recommended for home health therapy services.    Team Care Plan  Please review Integrated Patient View  Care Plan Flowsheet for Team identified  Problems, Interventions, and Goals.    Identified problems from team documentation:  Problem: Impaired Endocrine/Metabolic Function  Endocrine: Primary Team Goal: Patient will demonstrate understanding of DM  management to include adm of insulin, s/s of hypo/ hyper glycemia./Active    Problem: Impaired Mobility  Mobility: Primary Team Goal: Patient will perform household functional mobility  mod I using LRAD in order to ensure safe d/c home./Active    Problem: Impaired Psychosocial Skills/Behavior  PsychoSocial: Primary Team Goal: Pt will demonstrate coping skills for adjusting  to post-cardiac surgery recovery as reflected in positive expectations for  continuing to increase levels of functional independence/Active    Problem: Impaired Self-care Mgmt/ADL/IADL  Self Care: Primary Team Goal: Pt will be Mod I in ADL and functional mobility  and adhere to sternal precautions 100% of the time with no cuing./Active    Problem:  Impaired Skin/Wound Mgmt  Skin Wound: Primary Team Goal: Patient and caregiver will be independent of  wound care/Active    Status update for discharge:     Mobility:   Primary Goal: Met    3 Hour Rule Minutes: 60 minutes of PT treatment this session count towards  intensity and duration of therapy requirement. Patient was seen for the full  scheduled time of PT treatment this session.  Therapy Mode Minutes: Individual: 60 minutes.    Signed by: Arnoldo Hooker, PT  03/23/2014 11:00:00 AM

## 2014-03-23 NOTE — Progress Notes (Signed)
[facility]  Interventional Radiology  Progress Note       Case and imaging discussed with Dr. Murrell Converse. Patient has known PAD and a non healing ulcer at the medial aspect of the R ankle. She also has chronic LE edema. This may be a mixed arterial and venous/edema lesion. Non-invasive imaging and duplex arterial imaging 03/03/14 reviewed and there may be potential to optimize inflow to the R leg although there is severe infrapopliteal disease. Patient has CKD III with baseline Cr 1.6-1.7.    Will plan for angiography with possible R LE revascularization as an outpatient. She will need pre-op hydration and will plan for minimal contrast. Dr. Murrell Converse is agreeable with this plan.      Hulda Marin, MD  Department of CardioVascular & Interventional Radiology  Vascular and Interventional Radiology  NeuroInterventional Radiology  [facility]: [phone]  [facility]: [phone]

## 2014-03-23 NOTE — Rehab Progress Note ([facility]) (Signed)
[name]  MRN: [id number]  Account: [id number]  Session Start: 03/23/2014 2:00:00 PM  Session Stop: 03/23/2014 3:00:00 PM    Occupational Therapy  Inpatient Rehabilitation Progress Note - Brief    Rehab Diagnosis: Cardiac debility  Demographics:            Age: 60Y            Gender: Female  Rehabilitation Precautions/Restrictions:   Falls, Infection, sternal    SUBJECTIVE  Patient Report: My foot really hurts after everything they did earlier. My chest  hurts too. I am worried about home.  Pain: Patient currently has pain.  Patient reports a pain level of 6 out of 10. Repositioned patient.    OBJECTIVE  General Observation: Pt received seated in WC and agreeable to therapy. Pt left  seated in [facility] with all needs within reach, RN aware.    Interventions:       Self Care/Home Management:  Pt completed toileting with S. Pt completed  functional activity in kitchen to practice adhering to sternal precautions  during meal prep task including reaching (within limits), bending, and  retrieving items outside of BOS. Pt required cuing to adhere to sternal  precautions during session. Pt demo'd ability to utilize reacher for high  objects. Pt required cuing for safe use of RW and management of hot object. Pt  completed functional mobility with S and RW. Educated pt on benefit of  completing sit-stand transfers without use of UEs for push up to decrease strain  on sternum. Briefly discussed d/c.      Education Provided:    Education Provided: Precautions. Plan of care. Functional transfers. Home  management activities. Safety.       Audience: Patient.       Mode: Explanation.  Demonstration.       Response: Applied knowledge.  Verbalized understanding.  Needs practice.  Needs reinforcement.    ASSESSMENT  Pt benefits form cuing for problem solving to adhere to sternal precautions  during functional meal prep task. Pt reports increased pain in sternum  superficial and deep into left chest (reports she has discussed with  nursing and  doctor, will continue to monitor). Educated pt on strategies during functional  transfers and mobility to decrease strain through sternum for pain management.  Pt requires several seated rest breaks due to fatigue and reported increased  pain in chest and R foot.  Pt benefits from continued OT services to increase  safety and independence in ADLs/IADLs following sternal precautions.    PLAN  Continued Occupational Therapy is recommended.  Recommended Frequency/Duration/Intensity: 60-120 minutes, 5-6x week for 7-10  days from initial eval on 03/15/14  Continued Activities Contributing Toward Care Plan: ADL/IADL retraining, AE  training, sternal precautions, TherEx, TherAct, d/c planning    3 Hour Rule Minutes: 60 minutes of OT treatment this session count towards  intensity and duration of therapy requirement. Patient was seen for the full  scheduled time of OT treatment this session.  Therapy Mode Minutes: Individual: 60 minutes.    Signed by: Deitra Mayo, OTR/L 03/23/2014 3:00:00 PM

## 2014-03-23 NOTE — Rehab Progress Note ([facility]) (Signed)
[name]  MRN: [id number]  Account: [id number]  Session Start: 03/22/2014 12:00:00 AM  Session Stop: 03/22/2014 12:00:00 AM    Rehabilitation Nursing  Inpatient Rehabilitation Shift Assessment    Rehab Diagnosis: Debility secondary to NSTEMI and multivessel CAD S/P CABG  Demographics:            Age: 64Y            Gender: Female  Primary Language: English    Date of Onset:  02/26/14  Date of Admission: 03/14/2014 6:35:00 PM    Rehabilitation Precautions Restrictions:   Falls, Infection, sternal    Patient Report: I am doing fine [name]  Pain: Patient currently without complaints of pain.  Pain Reassessment: Pain was not reassessed as no pain was reported.  Patient/Caregiver Goals:  To go home and live normally    NEURO  Orientation/Awareness: Alert and Oriented x3.  Speech: No deficits noted at this time.  Behavior: Cooperative.    MEDICATIONS  IV Access: No IV access.  Dialysis Access: Patient does not have dialysis access.      Elopement Risk Level Assessment Tool  Patient Criteria: CATEGORY 1 CRITERIA:   No category 1 criteria. Elopement Risk:  No      RISK ASSESSMENT FOR FALLS/INJURY    MENTAL STATUS CRITERIA:   0 - None identified.  MENTAL STATUS TOTAL: 0    AGE CRITERIA:   93 - < 65 years old  AGE TOTAL: 0    ELIMINATION CRITERIA:   3 - Toileting with Assistance.   ELIMINATION TOTAL: 3    HISTORY OF FALLS CRITERIA:   2 - Unknown History.  HISTORY OF FALLS TOTAL: 2    MEDICATIONS CRITERIA:   2 or more High Risk Medications (*see list below)   MEDICATIONS TOTAL: 2    PHYSICAL MOBILITY CRITERIA:   3 - Decreased balance reaction.   1 - Weakness/impaired physical mobility.   PHYSICAL MOBILITY TOTAL: 4    FALLS RISK ASSESSMENT TOTAL: 11    Patient's Fall Risk: TOTAL SCORE >10: High Risk    Falls Interventions: Clutter removed and clear path to BR.  Call bell, phone, glasses, etc within reach.  Hourly toileting/safety checks between 6am and 10pm, then every 2 hours.  Pt and family education.  Assistive devices at  [facility].  Pharmacy review of meds.    NUTRITION  Diet:  Type: Consistent carbohydrate.  Food Consistency: Regular.  Liquid Consistency: Thin.      CARDIOVASCULAR     Bilateral lower extremities  Nail Bed Color: Pink.   RLE 1 + edema floated with pillow when in bed.  Pulses:   Apical Pulse: Regular. Strong. Rate is 74 .   Patient does not have a pacemaker.   Patient does not have a defibrillator.    CARDIOPULMONARY  Lung Sounds:   Upper lobes. Clear.   Lower lobes. Clear.  Type of Respirations: Regular.  Cough: No cough noted.  Respiratory Support: The patient does not require any respiratory support.  Respiratory Equipment: None.    INTEGUMENTARY  Skin:  Temperature: Warm  Turgor: Elastic  Moisture: Dry  Color of skin: Normal for Race/Ethnicity  Capillary Refill: Less than 3 seconds  Wounds/Incisions:       Right Foot wound dressing is intact, No drainage noted on the dressing.  Wound:     Surgical Incision: Thoracic incision. Length: 17 centimeter(s) with glue. No  signs of infection.  Drainage: Incision without drainage.  Odor:  No  Incision  Care: Per protocol.       upper abdominal old drainage sites x3 no signs of infection OTA.  Braden Scale for Predicting Pressure Sore Risk: Sensory Perception: No  impairment  Moisture: Rarely moist  Activity: Bedfast  Mobility: Slightly limited  Nutrition: Adequate  Friction and Shear: Potential problem  Braden Score: 17  Level of Risk: At risk (15-18)   Pressure Relief every 30 minutes while in wheelchair   Turn patient every 2 hours   Wound care consult    GASTROINTESTINAL  Abdomen: Soft.  Bowel Sounds:  Active bowel sounds audible in all four quadrants.  Date of Last Bowel Movement:  03/21/2014   No Problems/Complaints with Bowel Elimination Assessed.    GENITOURINARY  Current Bladder Pattern: Continent  Color:  Amber   Patient denies problems with urination and/or catheter.    MUSCULOSKELETAL  Upper Body: sternal precaution  Lower Body: Rt. ankle and foot wound    Functional  Measures      BLADDER MANAGEMENT - LEVEL OF ASSIST: Bladder Score = 5.  Patient is  supervision/set-up for bladder management, requiring: Emptying equipment.  Patient requires the following assistive device(s): Bedside Commode.    BLADDER ACCIDENTS THIS SHIFT:  0 . Patient has not had an accident this  assessment and did not require medications or devices.    BOWEL MANAGEMENT - LEVEL OF ASSIST: Bowel Score = 6.  Patient is modified  independent for bowel management.  Patient did not have bowel movement.  Medication/intervention was provided.    BOWEL ACCIDENTS THIS SHIFT: 0 . Patient has not had an accident, but used a  stool softener. 0    Education Provided:    Education Provided: Precautions. Pain management. Medication options. Side  effects. Plan of care. Nutrition/feeding. Dietary recommendations. Wound  healing. Skin/wound care. Signs/symptoms of infection. Role of nutrition in  wound prevention/healing. Critical pressure areas. Prevention of skin breakdown.  Medication. Name and dosage. Administration. Purpose. Side Effects. Interaction.  Administration of subcutaneous Heparin injections.       Audience: Patient.       Mode: Explanation.  Demonstration.       Response: Applied knowledge.  Verbalized understanding.  Demonstrated skill.  Needs practice.  Needs reinforcement.    Discharge: Patient is not being discharged at this time.    Long Term Goals:  1. Patient will be able to demonstrate skill in wound care,  dressing changes and skin monitoring with out queing. - Goal Not Met   2. Patient will perform wound dressing changes independently. - Goal Not Met   3. Patient will demonstrate safe transfers from bed to wheelchair, bed to chair  and and bed to bsc using sternal precautions.   4. Patient will communicate her understandfing of s/s of  wound infection and  when to call the doctor.   5. Patient will demonstrate her understanding of DM and how to monitor BS, adm  insulin, and s/s of hypo and  hyperglycemia. - Goal Met  Jan 11th (2 weeks)  Short Term Goals:  1. Patient will be able to call 100 percent of the time for  help when getting out of bed. She will communicate her understanding for  assistance. - Goal Met   2. Patient will be able to, at the end of the week demonstrate good hand  washing techniques, and wound care for lower right extremity with reach bar if  needed. - Goal Not Met  January 4th, one week    PROGRESS  TOWARD GOALS: Patientt has right l foot ulcer with drainage and odor .  Dressing is CDI. VS stable no signs of fever . No distress with respiration or  SOB noted. Right lower extremity elevated on pillow due to  edema.    PLAN: Nursing Specific Interventions  Pain Management. Skin Management. Wound Management.  Continue with the current Nursing Plan of Care.    TEAM CARE PLAN  Identified problems from team documentation:  Problem: Impaired Endocrine/Metabolic Function  Endocrine: Primary Team Goal: Patient will demonstrate understanding of DM  management to include adm of insulin, s/s of hypo/ hyper glycemia./Active    Problem: Impaired Mobility  Mobility: Primary Team Goal: Patient will perform household functional mobility  mod I using LRAD in order to ensure safe d/c home./Active    Problem: Impaired Psychosocial Skills/Behavior  PsychoSocial: Primary Team Goal: Pt will demonstrate coping skills for adjusting  to post-cardiac surgery recovery as reflected in positive expectations for  continuing to increase levels of functional independence/Active    Problem: Impaired Self-care Mgmt/ADL/IADL  Self Care: Primary Team Goal: Pt will be Mod I in ADL and functional mobility  and adhere to sternal precautions 100% of the time with no cuing./Active    Problem: Impaired Skin/Wound Mgmt  Skin Wound: Primary Team Goal: Patient and caregiver will be independent of  wound care/Active    Add/Update Problems from this assessment:  No updates at this time.    Please review Integrated Patient View  Care Plan Flowsheet for Team identified  Problems, Interventions, and Goals.    Signed by: Lia Foyer, RN 03/22/2014 2:42:00 AM

## 2014-03-23 NOTE — Rehab Progress Note ([facility]) (Signed)
[name]  MRN: [id number]  Account: [id number]  Session Start: 03/23/2014 12:00:00 AM  Session Stop: 03/23/2014 12:00:00 AM    Rehabilitation Nursing  Inpatient Rehabilitation Shift Assessment    Rehab Diagnosis: Cardiac debility  Demographics:            Age: 64Y            Gender: Female  Primary Language: English    Date of Onset:  02/26/14  Date of Admission: 03/14/2014 6:35:00 PM    Rehabilitation Precautions Restrictions:   Falls, Infection, sternal    Patient Report: Hello  Pain: Patient currently has pain.  Location: Right leg  Type: Acute  Quality: Aching.  Pain Scale: Numeric.  Patient reports a pain level of 10 out of 10.  Patient's acceptable level of pain 0 out of 10.   Interferes with physical activity.  Pain is alleviated by: Medications  Pain is exacerbated by: Movement Patient medicated.  Pain Reassessment:  Response to Pain Intervention: Releived  Post Intervention Pain Quality:  Aching.  Patient Reports Post Intervention Pain Level of: 2 out of 10  Pain Acceptable: Yes  Patient/Caregiver Goals:  To go home and live normally    NEURO  Orientation/Awareness: Alert and Oriented x4.  Speech: No deficits noted at this time.  Behavior: Cooperative.    MEDICATIONS  IV Access: Patient has IV access.     Type: Saline Lock  IV Gauge: 22  Location: Right AC  Care Provided: Flushed after medication with 10ml normal saline.   Date Inserted:   03/22/14  Dialysis Access: Patient does not have dialysis access.      Elopement Risk Level Assessment Tool  Patient Criteria: Patient is not capable of leaving the unit.  Assessment is not  applicable.      RISK ASSESSMENT FOR FALLS/INJURY    MENTAL STATUS CRITERIA:   0 - None identified.  MENTAL STATUS TOTAL: 0    AGE CRITERIA:   64 - < 34 years old  AGE TOTAL: 0    ELIMINATION CRITERIA:   3 - Toileting with Assistance.   ELIMINATION TOTAL: 3    HISTORY OF FALLS CRITERIA:   2 - Unknown History.  HISTORY OF FALLS TOTAL: 2    MEDICATIONS CRITERIA:   2 or more High Risk  Medications (*see list below)   MEDICATIONS TOTAL: 2    PHYSICAL MOBILITY CRITERIA:   3 - Decreased balance reaction.   1 - Weakness/impaired physical mobility.   PHYSICAL MOBILITY TOTAL: 4    FALLS RISK ASSESSMENT TOTAL: 11    Patient's Fall Risk: TOTAL SCORE >10: High Risk    Falls Interventions: Clutter removed and clear path to BR.  Call bell, phone, glasses, etc within reach.  Hourly toileting/safety checks between 6am and 10pm, then every 2 hours.  Yellow "high risk" patient identification in place: wrist band, socks, chart  sticker, door sign.  Pt and family education.  Assistive devices at [facility].  Pharmacy review of meds.    NUTRITION  Diet:  Type: Consistent carbohydrate.  Food Consistency: Regular.  Liquid Consistency: Thin.      CARDIOVASCULAR     Bilateral lower extremities  Nail Bed Color: Pink.   + 1 edema to right foot  Pulses:   Apical Pulse: Regular. Strong. Rate is 80 .   Patient does not have a pacemaker.   Patient does not have a defibrillator.    CARDIOPULMONARY  Lung Sounds:   Upper lobes. Clear.  Lower lobes. Clear.  Type of Respirations: Regular.  Cough: No cough noted.  Respiratory Support: The patient does not require any respiratory support.  Respiratory Equipment: 98%, RA.    INTEGUMENTARY  Skin:  Temperature: Warm  Turgor: Normal for age  Moisture: Dry  Color of skin: Normal for Race/Ethnicity  Capillary Refill: Less than 3 seconds  Wounds/Incisions:     Open wound. Rt leg Tissue Type: Granulation Tissue. Tissue Type: Slough-  Length: 15 centimeters    Width: 4 centimeters.    Depth: No, secondary to  necrotic tissue.  Open Surface Area:  60 cm  Undermining: No  Tunneling:  No  Peri-Wound Erythema: No  Skin Color:  Dry  skin  Open Wound Care: Santyl oint with daily TX.  Braden Scale for Predicting Pressure Sore Risk: Sensory Perception: No  impairment  Moisture: Rarely moist  Activity: Chairfast  Mobility: No limitation  Nutrition: Adequate  Friction and Shear: Potential problem  Braden  Score: 19  Level of Risk: No risk (19-23). Will reassess every shift.    GASTROINTESTINAL  Abdomen: Soft. Nontender.  Bowel Sounds:  Active bowel sounds audible in all four quadrants.  Date of Last Bowel Movement:  03/21/13   No Problems/Complaints with Bowel Elimination Assessed.    GENITOURINARY  Current Bladder Pattern: Continent  Color:  Yellow   Patient denies problems with urination and/or catheter.    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 Patient is completely independent for eating.  There  are no activity limitations.    BLADDER MANAGEMENT - LEVEL OF ASSIST: Bladder Score = 7. Patient is completely  independent for bladder management. There are no activity limitations.    BLADDER ACCIDENTS THIS SHIFT:  0 . Patient has not had an accident this  assessment and did not require medications or devices.    BOWEL MANAGEMENT - LEVEL OF ASSIST: Bowel Score = 6. Patient is modified  independent for bowel management requiring: Stool softeners    BOWEL ACCIDENTS THIS SHIFT: 0 . Patient has not had an accident and did not  require medications or devices.    Education Provided:    Education Provided: Precautions. Pain management. Pain scale. Medication  options. Side effects. Clinical indicators of pain. Safety issues and  interventions. Fall protocol. Skin/wound care. Signs/symptoms of infection. Role  of nutrition in wound prevention/healing. Prevention of skin breakdown.       Audience: Patient.       Mode: Explanation.  Demonstration.       Response: Applied knowledge.  Verbalized understanding.    Discharge: Patient is not being discharged at this time.    Long Term Goals:  1. Patient will be able to demonstrate skill in wound care,  dressing changes and skin monitoring with out queing. - Goal Not Met   2. Patient will perform wound dressing changes independently. - Goal Not Met   3. Patient will  demonstrate safe transfers from bed to wheelchair, bed to chair  and and bed to bsc using sternal precautions.   4. Patient will communicate her understandfing of s/s of  wound infection and  when to call the doctor.   5. Patient will demonstrate her understanding of DM and how to monitor BS, adm  insulin, and s/s of hypo and hyperglycemia. - Goal Met  Jan 11th (2 weeks)  Short Term Goals:  1.  Patient will be able to call 100 percent of the time for  help when getting out of bed. She will communicate her understanding for  assistance. - Goal Met   2. Patient will be able to, at the end of the week demonstrate good hand  washing techniques, and wound care for lower right extremity with reach bar if  needed. - Goal Not Met  January 4th, one week    PROGRESS TOWARD GOALS: SHORT TERM GOAL REVIEW:       1. Patient will be able to call 100 percent of the time for help when  getting out of bed. She will communicate her understanding for assistance. - Met         2. Patient will be able to, at the end of the week demonstrate good hand  washing techniques, and wound care for lower right extremity with reach bar if  needed. - Not Met: Requires prompting from staff for good hand washing at times.    Time frame to achieve short term goal(s):  January 4th, one week   LONG TERM GOAL REVIEW:       1. Patient will be able to demonstrate skill in wound care, dressing  changes and skin monitoring with out queing. - Not Met: Teaching in progress       2. Patient will perform wound dressing changes independently. - Not Met  Anable to perform wound dresing change independetly at this time.  Timeframe to achieve long term goal(s):  Jan 11th (2 weeks)    PLAN: Nursing Specific Interventions  Pain Management. Skin Management. Wound Management.  Continue with the current Nursing Plan of Care.    TEAM CARE PLAN  Identified problems from team documentation:  Problem: Impaired Endocrine/Metabolic Function  Endocrine: Primary Team Goal: Patient  will demonstrate understanding of DM  management to include adm of insulin, s/s of hypo/ hyper glycemia./Active    Problem: Impaired Mobility  Mobility: Primary Team Goal: Patient will perform household functional mobility  mod I using LRAD in order to ensure safe d/c home./Met    Problem: Impaired Psychosocial Skills/Behavior  PsychoSocial: Primary Team Goal: Pt will demonstrate coping skills for adjusting  to post-cardiac surgery recovery as reflected in positive expectations for  continuing to increase levels of functional independence/Active    Problem: Impaired Self-care Mgmt/ADL/IADL  Self Care: Primary Team Goal: Pt will be Mod I in ADL and functional mobility  and adhere to sternal precautions 100% of the time with no cuing./Active    Problem: Impaired Skin/Wound Mgmt  Skin Wound: Primary Team Goal: Patient and caregiver will be independent of  wound care/Active    Add/Update Problems from this assessment:  No updates at this time.    Please review Integrated Patient View Care Plan Flowsheet for Team identified  Problems, Interventions, and Goals.    Signed by: Coralyn Pear, RN 03/23/2014 1:00:00 PM

## 2014-03-23 NOTE — Progress Notes (Signed)
PHYSICAL MEDICINE AND REHABILITATION  PROGRESS NOTE    Date Time: 03/23/2014 11:16 AM  Patient Name: [name], [name]    Admission date:  03/14/2014      Subjective:   The family is present for the family meeting.  The patient has pain right foot.  She is able to eat and drink.  No new sleeping issues.  Adequate bowel and bladder.  Functional Status:     PT:  TRANSFERS BED/CHAIR/WHEELCHAIR: Bed/chair/wheelchair Transfer Score = 6.  Patient is modified independent for transferring to and from the  bed/chair/wheelchair, requiring: Walker. Pt is mod I for bed mobility and  transfers using RW. She performs car transfer with RW for set up.  LOCOMOTION WHEELCHAIR:  Wheelchair locomotion was observed using a manual wheelchair. Wheelchair  Distance Scale = 3. Distance traveled in wheelchair is greater than 150 feet.  Wheelchair Score = 5. Patient is supervision or set up only for propelling  wheelchair, requiring: Stand by assistance. Patient was able to propel a  distance of 300 feet in a wheelchair. pt uses LEs in order to remain compliant  with sternal precautions.  LOCOMOTION WALK:  Walk Distance Scale = 2. Distance walked is 50 -149 feet. Walk Score = 2.  Patient is able to walk at least 50 feet (household distance) but requires  assistance. Patient walked a distance of 118 feet. Rolling walker. Pt ambulates  over even surface with RW. Gait characterized by reduced step length at LLE,  hyperextension at right knee throughout gait cycle, reduced ankle DF at right  Ankle.    OT:  Self Care/Home Management: Pt completed toileting with S. Pt completed  functional activity in kitchen to practice adhering to sternal precautions  during meal prep task including reaching (within limits), bending, and  retrieving items outside of BOS. Pt required cuing to adhere to sternal  precautions during session. Pt demo'd ability to utilize reacher for high  objects. Pt required cuing for safe use of RW and management of hot object.  Pt  completed functional mobility with S and RW. Educated pt on benefit of  completing sit-stand transfers without use of UEs for push up to decrease strain  on sternum.     Self Care/Home Management: Initially OT treatment focused on home safety  education and fall prevention education 2/2 deficits in balance, endurance.  Patient ambulated with RW approx50'/80' with SPV. Patient able to self-monitor need  for standing rest breaks. Patient engaged in laundry task to load/unload  front-load washing machine using safe techniques 2/2 sternal precautions.  Patient able to demo. use of reacher to obtain single clothing items from  machine w/use of safe technique and no LOB. Patient utilized reacher to pick up  items from floor within sternal precautions following initial demo/instruction.    Medications:   Medication reviewed by me:     Scheduled Meds: PRN Meds:        amiodarone 200 mg Oral Daily   amLODIPine 10 mg Oral Daily   aspirin EC 325 mg Oral Daily   atorvastatin 10 mg Oral QHS   calcitRIOL 0.25 mcg Oral Daily   carvedilol 25 mg Oral Q12H SCH   collagenase  Topical Q24H   docusate sodium 100 mg Oral Daily   furosemide 20 mg Oral Daily   gabapentin 300 mg Oral BID   heparin (porcine) 5,000 Units Subcutaneous Q12H SCH   hydrALAZINE 100 mg Oral TID   insulin aspart 3 Units Subcutaneous Once   iron sucrose 200  mg Intravenous Q24H SCH   venlafaxine 75 mg Oral BID       Continuous Infusions:      acetaminophen 650 mg Q6H PRN   albuterol-ipratropium 3 mL Q2H PRN   bisacodyl 10 mg QD PRN   dextrose 25 mL PRN   diphenhydrAMINE 25 mg BID PRN   diphenhydrAMINE-zinc acetate  TID PRN   glucagon (rDNA) 1 mg PRN   insulin aspart 1-8 Units PRN   magnesium hydroxide 10 mL Q6H PRN   oxyCODONE-acetaminophen 1 tablet Q4H PRN   polyethylene glycol 17 g QD PRN   traMADol 50 mg Q6H PRN           Review of Systems:   A comprehensive review of systems was: No fevers, chills, nausea, vomiting, chest pain, shortness of breath, cough,  headache, double vision.  All others negative.    Physical Exam:     Filed Vitals:    03/22/14 2010 03/22/14 2229 03/23/14 0553 03/23/14 0857   BP: 127/52 140/66 153/68 156/63   Pulse: 74 78 75 80   Temp: 98.6 F (37 C)  99.3 F (37.4 C)    TempSrc: Oral      Resp:       SpO2: 96%          Intake and Output Summary (Last 24 hours) at Date Time  No intake or output data in the 24 hours ending 03/23/14 1116                    Cardiac: regular rhythm  Chest / Lungs:  Clear to auscultation.  Abdomen:  + bowel sounds, Soft, non-tender, non-distended.  Extremities: no calf tenderness.  Right dorsal foot is dressed.  Right ankle 1+ edema.    Labs:   No results for input(s): GLUCOSEWHOLE in the last 24 hours.      Recent Labs  Lab 03/22/14  0520 03/19/14  0605 03/17/14  0510   WBC 4.95 5.16 6.47   HEMOGLOBIN 9.0* 8.9* 8.8*   HEMATOCRIT 30.2* 29.7* 29.8*   PLATELETS 276 271 287          Recent Labs  Lab 03/22/14  0520 03/19/14  0605 03/18/14  0950 03/17/14  0510   SODIUM 139 143 139 141   POTASSIUM 4.9 5.0 5.3* 5.2*   CHLORIDE 108 109 107 109   CO2 22 25 24 24    BUN 35* 36* 35* 32*   CREATININE 1.9* 2.1* 2.0* 1.9*   CALCIUM 9.0 8.9 8.9 8.6   ALBUMIN 2.8* 2.8* 2.9* 2.8*   PROTEIN, TOTAL  --   --   --  5.8*   BILIRUBIN, TOTAL  --   --   --  0.2   ALKALINE PHOSPHATASE  --   --   --  78   ALT  --   --   --  13   AST (SGOT)  --   --   --  17   GLUCOSE 118* 99 144* 114*   PHOSPHORUS 3.8 3.7 4.0  --        No results for input(s): INR, APTT in the last 168 hours.    Results     Procedure Component Value Units Date/Time    Glucose Whole Blood - POCT [[id number]]  (Abnormal) Collected:  03/23/14 0638     POCT - Glucose Whole blood 119 (H) mg/dL Updated:  40/98/11 9147    Wound culture & gram stain [[id number]] Collected:  03/22/14 1600  Specimen Information:  Wound / Drainage Updated:  03/22/14 2148    Narrative:      ORDER#: [id number]                                    ORDERED BY: Selena Batten, Elio Haden HUN  SOURCE: Drainage right lower leg  wound drainage      COLLECTED:  03/22/14 16:00  ANTIBIOTICS AT COLL.:                                RECEIVED :  03/22/14 18:24  Stain, Gram                                FINAL       03/22/14 21:48  03/22/14   Few Gram positive cocci             Few Gram negative rods             Moderate Gram positive rods             No Squamous epithelial cells seen             No WBCs seen  Culture and Gram Stain, Aerobic, Wound     PENDING      Glucose Whole Blood - POCT [[id number]]  (Abnormal) Collected:  03/22/14 2129     POCT - Glucose Whole blood 128 (H) mg/dL Updated:  56/21/30 8657    Glucose Whole Blood - POCT [[id number]]  (Abnormal) Collected:  03/22/14 1624     POCT - Glucose Whole blood 105 (H) mg/dL Updated:  84/13/24 4010    Glucose Whole Blood - POCT [[id number]]  (Abnormal) Collected:  03/22/14 1110     POCT - Glucose Whole blood 263 (H) mg/dL Updated:  03/47/42 5956               Rads:   Radiological Procedure reviewed.  Radiology Results (24 Hour)     ** No results found for the last 24 hours. 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  - we are having a family meeting with the pt, pt's family, PT, OT, CM, MD to discuss medical status and rehab status.  Medical Questions were answered to their satisfaction.  - Estimated length of stay to home with March 25, 2014 if medically stable.  - Continue with current pain medications. Increased doses of Percocet give her hallucinations.  She is dealing with her pain with 5 mg Percocet as needed.                Blood loss anemia also with iron deficiency  - Appreciate nephrology efforts.  On IV iron.    History of anemia of chronic disease  - Iron deficiency.  On IV iron    Chronic kidney disease III  - Nephrology is following.  Slightly reduced creatinine.  -  Continue with Lasix    Hypertension  - Follow the trend.  Appreciate nephrology.    Right foot chronic arterial ulcer  -  Continue with local care.    - I spoke with the wound care nurse.  Consult the wound healing Center.  - Eventual outpatient followup with vascular surgery.  I discussed with the patient her history of acute kidney injury with IV contrast but the eventual need for scheduled angiogram.  - I spoke with podiatry.  Appreciate efforts.  Continue with wound care inpatient and outpatient.  - Appreciate interventional radiology.  Outpatient followup with interventional radiology.    Diabetes mellitus  - Fairly Acontrolled blood glucose.  Follow with current diet.      History of moderate aortic stenosis    History of LVEF 45%    Severe peripheral arterial disease    History of COPD    Patient Active Problem List   Diagnosis   . NSTEMI (non-ST elevated myocardial infarction)   . Hypertensive urgency   . PAD (peripheral artery disease)   . Arterial leg ulcer   . Chronic obstructive pulmonary disease, unspecified COPD type   . Ischemic cardiomyopathy   . CAD (coronary artery disease)   . Diabetes   . Chronic kidney disease   . Debilitated       Continue comprehensive and intensive inpatient rehab program, including:   Physical therapy 60-120 min daily, 5-6 times per week, Occupational therapy 60-120 min daily, 5-6 times per week, Case management and Rehabilitation nursing    Signed by: [name] MD    [facility]

## 2014-03-23 NOTE — Consults (Signed)
Podiatric Surgery Consultation    Reason for Consultation: right ankle ulcer  03/23/2014 9:18 AM    HPI: [name] is a 64 year old female patient admitted on 03/14/2014 for acute rehab S/P CABG. PMH significant for DM, COPD CKD stage III, severe PAD with right leg angioplasty and chronic right foot ulcer. Ulcer has been present for over two years per previous progress notes. Previously treated by WOCN at [facility] with daily dressings of Medihoney gel and it was just changed to Iodosorb gel by wound nurse yesterday due to odor.      Past Medical History:   Past Medical History   Diagnosis Date   . Diabetes mellitus    . Chronic kidney disease    . Myocardial infarction    . Hypertension    . Hyperlipidemia    . Peripheral arterial disease      06/28/13 right superficial femoral artery stenosis, tibial peroneal trunk stenosis, left CVL a stenosis, treated with angiography and angioplasty.   . Coronary artery disease      Left circumflex artery presumed DES.,  Chronically occluded right coronary artery stent   . Anemia      She redid to chronic disease and iron deficiency.     . Arterial leg ulcer      2014, receiving home health   . Chronic obstructive pulmonary disease      Previously on Spiriva and Advair       Past Surgical History:   Past Surgical History   Procedure Laterality Date   . Cardiac angiography and angioplasty  06/2013   . Colonoscopy  Unknown   . Tubal ligation     . Coronary artery bypass N/A 03/04/2014     Procedure: CORONARY ARTERY BYPASS;  Surgeon: Austin Miles, MD;  Location: [facility] HEART OR;  Service: Cardiovascular;  Laterality: N/A;  LIMA to LAD  SVG to OM   . Endoscopic,vein harvest Left 03/04/2014     Procedure: ENDOSCOPIC,VEIN HARVEST;  Surgeon: Austin Miles, MD;  Location: [facility] HEART OR;  Service: Cardiovascular;  Laterality: Left;  By D. Lazarus Gowda, RNFA. Left groin to mid-calf.   Rhae Hammock N/A 03/04/2014     Procedure: TEE;  Surgeon: Austin Miles, MD;  Location: [facility]  HEART OR;  Service: Cardiovascular;  Laterality: N/A;  probe [phone]       Medications:   Home Meds:   Prior to Admission medications    Medication Sig Start Date End Date Taking? Authorizing Provider   acetaminophen (TYLENOL) 325 MG tablet Take 2 tablets (650 mg total) by mouth every 4 (four) hours as needed for Pain. 03/14/14   Jacelyn Pi, PA   albuterol-ipratropium (DUO-NEB) 2.5-0.5(3) mg/3 mL nebulizer Take 3 mLs by nebulization every 2 (two) hours as needed (wheezing). 03/14/14   Jacelyn Pi, PA   amLODIPine (NORVASC) 10 MG tablet Take 1 tablet (10 mg total) by mouth daily. 03/14/14   Jacelyn Pi, PA   aspirin 325 MG tablet Take 1 tablet (325 mg total) by mouth daily. 03/14/14   Jacelyn Pi, PA   atorvastatin (LIPITOR) 10 MG tablet Take 10 mg by mouth nightly.    [provider]   cadexomer iodine (IODOSORB) 0.9 % gel Apply topically daily. 03/14/14   Jacelyn Pi, PA   carvedilol (COREG) 25 MG tablet Take 1 tablet (25 mg total) by mouth every 12 (twelve) hours. 03/14/14   Jacelyn Pi, PA   fluticasone (FLOVENT HFA) 220  MCG/ACT inhaler Inhale 2 puffs into the lungs 2 (two) times daily. 03/14/14   Jacelyn Pi, PA   furosemide (LASIX) 20 MG tablet Take 20 mg by mouth daily.       [provider]   gabapentin (NEURONTIN) 300 MG capsule Take 600 mg by mouth 3 (three) times daily.       [provider]   hydrALAZINE (APRESOLINE) 25 MG tablet Take 3 tablets (75 mg total) by mouth every 12 (twelve) hours. 03/14/14   Jacelyn Pi, PA   insulin aspart (NOVOLOG) 100 UNIT/ML injection Inject 1-4 Units into the skin 3 (three) times daily before meals as needed for High Blood Sugar. 03/14/14   [place], Min [place] J, [place]   insulin aspart (NOVOLOG) 100 UNIT/ML injection Inject 3 Units into the skin every morning with breakfast. 03/14/14   [place]rick, [place] J, [place]   insulin aspart (NOVOLOG) 100 UNIT/ML injection Inject 3  Units into the skin daily with dinner. 03/14/14   [place], Min [employer] J, [place]   insulin aspart (NOVOLOG) 100 UNIT/ML injection Inject 3 Units into the skin daily with lunch. 03/14/14   [place], Min [place] J, [place]   insulin lispro (HUMALOG) 100 UNIT/ML injection Inject into the skin 4 times daily - with meals and at bedtime.    [provider]   oxyCODONE-acetaminophen (PERCOCET) 5-325 MG per tablet Take 1 tablet by mouth every 4 (four) hours as needed. 03/14/14   Jacelyn Pi, PA   polyethylene glycol [facility]) packet Take 17 g by mouth daily. 03/14/14   Jacelyn Pi, PA   sodium bicarbonate 650 MG tablet Take 1 tablet (650 mg total) by mouth 3 (three) times daily. 03/14/14   Jacelyn Pi, PA   traMADol (ULTRAM) 50 MG tablet Take 1 tablet (50 mg total) by mouth every 4 (four) hours as needed. 03/14/14   Jacelyn Pi, PA   venlafaxine (EFFEXOR) 75 MG tablet Take 75 mg by mouth 2 (two) times daily.    [provider]      Current Hospital Meds: Current facility-administered medications: acetaminophen (TYLENOL) tablet 650 mg, 650 mg, Oral, Q6H PRN, [name], MD, 650 mg at 03/15/14 0555;  albuterol-ipratropium (DUO-NEB) 2.5-0.5(3) mg/3 mL nebulizer 3 mL, 3 mL, Nebulization, Q2H PRN, [name], MD;  amiodarone (PACERONE) tablet 200 mg, 200 mg, Oral, Daily, [name], MD, 200 mg at 03/23/14 0847  amLODIPine (NORVASC) tablet 10 mg, 10 mg, Oral, Daily, [name], MD, 10 mg at 03/23/14 0848;  aspirin EC EC tablet 325 mg, 325 mg, Oral, Daily, [name], MD, 325 mg at 03/23/14 6045;  atorvastatin (LIPITOR) tablet 10 mg, 10 mg, Oral, QHS, [name], MD, 10 mg at 03/22/14 2011;  bisacodyl (DULCOLAX) suppository 10 mg, 10 mg, Rectal, QD PRN, [name], MD  calcitRIOL (ROCALTROL) capsule 0.25 mcg, 0.25 mcg, Oral, Daily, Verlene Mayer, MD, 0.25 mcg at 03/23/14 0847;  carvedilol (COREG) tablet 25 mg, 25 mg, Oral, Q12H SCH, [name], MD, 25 mg at 03/23/14  0847;  collagenase (SANTYL) ointment, , Topical, Q24H, Kerington Hildebrant, DPM;  dextrose 50 % bolus 25 mL, 25 mL, Intravenous, PRN, [name], MD  diphenhydrAMINE (BENADRYL) capsule 25 mg, 25 mg, Oral, BID PRN, Leonie Green, MD, 25 mg at 03/22/14 0516;  diphenhydrAMINE-zinc acetate (BENADRYL) 2-0.1 % cream, , Topical, TID PRN, [name], MD;  docusate sodium (COLACE) capsule 100 mg, 100 mg,  Oral, Daily, [name], MD, 100 mg at 03/23/14 0848;  furosemide (LASIX) tablet 20 mg, 20 mg, Oral, Daily, Dione Housekeeper, MD, 20 mg at 03/23/14 0848  gabapentin (NEURONTIN) capsule 300 mg, 300 mg, Oral, BID, Verlene Mayer, MD, 300 mg at 03/23/14 0847;  glucagon (rDNA) (GLUCAGEN) injection 1 mg, 1 mg, Intramuscular, PRN, [name], MD;  heparin (porcine) injection 5,000 Units, 5,000 Units, Subcutaneous, Q12H SCH, [name], MD, 5,000 Units at 03/23/14 [phone];  hydrALAZINE (APRESOLINE) tablet 100 mg, 100 mg, Oral, TID, Verlene Mayer, MD, 100 mg at 03/23/14 0554  insulin aspart (NovoLOG) injection 1-8 Units, 1-8 Units, Subcutaneous, PRN, [name], MD, 5 Units at 03/22/14 1127;  insulin aspart (NovoLOG) injection 3 Units, 3 Units, Subcutaneous, Once, [name], MD, 3 Units at 03/14/14 1945;  iron sucrose (VENOFER) injection 200 mg, 200 mg, Intravenous, Q24H SCH, Verlene Mayer, MD, 200 mg at 03/23/14 2440  magnesium hydroxide (MILK OF MAGNESIA) 400 MG/5ML suspension 10 mL, 10 mL, Oral, Q6H PRN, [name], MD;  oxyCODONE-acetaminophen (PERCOCET) 5-325 MG per tablet 1 tablet, 1 tablet, Oral, Q4H PRN, [name], MD, 1 tablet at 03/23/14 0846;  polyethylene glycol (MIRALAX) packet 17 g, 17 g, Oral, QD PRN, [name], MD;  traMADol Janean Sark) tablet 50 mg, 50 mg, Oral, Q6H PRN, [name], MD  venlafaxine [facility]) tablet 75 mg, 75 mg, Oral, BID, [name], MD, 75 mg at 03/23/14 0848      Allergies:   Allergies   Allergen Reactions   . Penicillins Anaphylaxis and Angioedema      Tolerates cephalosporins        Family History:   Family History   Problem Relation Age of Onset   . Coronary artery disease Mother      Died at 36 from MI.   Marland Kitchen Coronary artery disease Father      Died at 9 from MI   . Diabetes type II       Mother and father       Social History:   History     Social History   . Marital Status: Widowed     Spouse Name: N/A     Number of Children: N/A   . Years of Education: N/A     Occupational History   . Not on file.     Social History Main Topics   . Smoking status: Former Smoker -- 1.00 packs/day for 20 years     Quit date: 02/29/2012   . Smokeless tobacco: Not on file   . Alcohol Use: Not on file   . Drug Use: Not on file   . Sexual Activity: Not on file     Other Topics Concern   . Not on file     Social History Narrative       Review of Systems:  Negative for chest pain and shortness of breath. +right medial ankle ulcer    Physical exam:       Patient is a 64 y.o. year old female who is alert, well appearing, and in no distress.  Filed Vitals:    03/23/14 0857   BP: 156/63   Pulse: 80   Temp:    Resp:    SpO2:               Estimated body mass index is 29.04 kg/(m^2) as calculated from the following:    Height as of 03/04/14:  1.562 m (5' 1.5").    Weight as of 02/28/14: 70.852 kg (156 lb 3.2 oz).    Both Lower Extremity:   Vascular: Dorsalis pedis: weak, Posterior Tibial:weak. Pedal edema is moderate.     Derm:  Chronic arterial/venous ulcer on medial aspect of right foot measuring 11.0 x 5.0 x 1.0 cm, tissue pink granulation with a moderate amount of fibrin throughout the wound bed, minimal bleeding noted with cleaning the wound, minimal serous drainage noted on dressing. Small skin bridge noted in middle of wound noted to be slightly macerated.    Ortho: Gross motor intact.  Tenderness to palpation mild.   Neuro: Protective sensation absent.       Labs:    Results     Procedure Component Value Units Date/Time    Glucose Whole Blood - POCT [[id number]]  (Abnormal)  Collected:  03/23/14 0638     POCT - Glucose Whole blood 119 (H) mg/dL Updated:  40/98/11 9147    Wound culture & gram stain [[id number]] Collected:  03/22/14 1600    Specimen Information:  Wound / Drainage Updated:  03/22/14 2148    Narrative:      ORDER#: [id number]                                    ORDERED BY: Selena Batten, SEA HUN  SOURCE: Drainage right lower leg wound drainage      COLLECTED:  03/22/14 16:00  ANTIBIOTICS AT COLL.:                                RECEIVED :  03/22/14 18:24  Stain, Gram                                FINAL       03/22/14 21:48  03/22/14   Few Gram positive cocci             Few Gram negative rods             Moderate Gram positive rods             No Squamous epithelial cells seen             No WBCs seen  Culture and Gram Stain, Aerobic, Wound     PENDING      Glucose Whole Blood - POCT [[id number]]  (Abnormal) Collected:  03/22/14 2129     POCT - Glucose Whole blood 128 (H) mg/dL Updated:  44/01/02 7253    Glucose Whole Blood - POCT [[id number]]  (Abnormal) Collected:  03/22/14 1624     POCT - Glucose Whole blood 105 (H) mg/dL Updated:  25/95/63 8756    Glucose Whole Blood - POCT [[id number]]  (Abnormal) Collected:  03/22/14 1110     POCT - Glucose Whole blood 263 (H) mg/dL Updated:  41/66/06 3016    Ferritin [[id number]]  (Abnormal) Collected:  03/22/14 0520    Specimen Information:  Blood Updated:  03/22/14 1000     Ferritin 431.87 (H) ng/mL             Assessment: TALIA HOHEISEL is a 64 y.o. female with  1. Right arterial venous combination ulcer ankle.  2. DM   3. PVD  4. Leg edema b/l    Plan:  1. Pt S & E.  2. Local wound care recommended and ?healing potential. Apply Iodosorb ointment to ulcer followed by dry 4x4, kerlix and medipore tape today but I will change to Santyl to loosen the fibrin. Also I spoke to IR for possible angio right LE and IR will follow her as out pt.  Mild ACE compression due to edema and known severe PAD.   3. She needs to follow up as out pt at wound healing  center.      Glenford Peers, DPM

## 2014-03-23 NOTE — Rehab Discharge Summary ([facility]il[facility]) (Signed)
NAMELELANIA [name]  MRN: [id number]  Account: [id number]  Session Start: 03/23/2014 12:00:00 AM  Session Stop: 03/23/2014 12:00:00 AM    Therapeutic Recreation  Inpatient Rehabilitation Discharge Summary    Rehab Diagnosis: Cardiac debility  Demographics:            Age: 64Y            Gender: Female    Past Medical History: Diabetes mellitus  Chronic kidney disease  Myocardial infarction  Hypertension  Hyperlipidemia  Peripheral arterial disease 06/28/13 right superficial femoral artery stenosis,  tibial peroneal trunk stenosis, left CVL a stenosis, angioplasty  Coronary artery disease  Left circumflex artery presumed DES, Chronically occluded right coronary artery  stent  Anemia  Arterial leg ulcer 2014, receiving home health  Chronic obstructive pulmonary disease    PAST SURGICAL HISTORY  Cardiac angiography and angioplasty   06/2013  Colonoscopy  Tubal ligation  History of Present Illness: 64 year old female with PMH DM2, PAD, HTN, CKD,  COPD, CAD s/p RCA stent initially admitted [facility] with syncope  02/26/14. Found to have NSTEMI and cardiac cath revealed multivessel CAD.  Transferred [facility] 02/28/14 and subsequently underwent CABG x2 on 12/18. Patient  also with severe PAD with chronic RLE arterial ulcer, s/p angioplasty in 06/2013;  clinically improving  but has not had wound care recently. Prior to hosp  admission patient had not been taking her Spiriva or Symbicort for COPD; resumed  in hospital. She is also being treated for asymptomatic bacteriuria and iron  deficiency anemia. She is DM2 and her Hgb A1C 7.1.            Date of Onset:  02/26/14            Date of Admission: 03/14/2014 6:35:00 PM    Rehabilitation Precautions/Restrictions:   Falls, Infection, sternal    OBJECTIVE  Leisure Participation:  Modified Independent for participation in leisure  activities.  Equipment Recommended: No leisure equipment was recommended at discharge.  Community Mobility: Patient Not assessed.    Functional  Measures      Education Provided:  No education provided this session.    ASSESSMENT  Summary of Deficits and Recommended Follow-up: Pt would continue to benefit from  leisure participation in home setting  Progress Toward Goals: Goal 1- met  Goal 2- not met    PLAN  Therapeutic Recreation services are not recommended at this time due to: No need  for skilled therapy intervention at this time.    Care Plan  Identified problems from team documentation:  Problem: Impaired Endocrine/Metabolic Function  Endocrine: Primary Team Goal: Patient will demonstrate understanding of DM  management to include adm of insulin, s/s of hypo/ hyper glycemia./Active    Problem: Impaired Mobility  Mobility: Primary Team Goal: Patient will perform household functional mobility  mod I using LRAD in order to ensure safe d/c home./Met    Problem: Impaired Psychosocial Skills/Behavior  PsychoSocial: Primary Team Goal: Pt will demonstrate coping skills for adjusting  to post-cardiac surgery recovery as reflected in positive expectations for  continuing to increase levels of functional independence/Active    Problem: Impaired Self-care Mgmt/ADL/IADL  Self Care: Primary Team Goal: Pt will be Mod I in ADL and functional mobility  and adhere to sternal precautions 100% of the time with no cuing./Active    Problem: Impaired Skin/Wound Mgmt  Skin Wound: Primary Team Goal: Patient and caregiver will be independent of  wound care/Active    Status update for discharge:  Please review Integrated Patient View Care Plan Flowsheet for Team identified  Problems, Interventions, and Goals.    Signed by: Shanon Payor, CTRS 03/23/2014 2:50:00 PM

## 2014-03-23 NOTE — Rehab Progress Note ([facility]) (Signed)
NAMEMARIENA MEARES  MRN: [id number]  Account: [id number]  Session Start: 03/23/2014 12:00:00 AM  Session Stop: 03/23/2014 12:00:00 AM    SEVERE SEPSIS SCREEN  Branch  If you are unable to assess a system's dysfunction because you do not have labs,  or the labs you have are not current (within 24 hours), call physician and  request and order for the lab tests needed.    Signed by: Dolores Hoose, RN 03/23/2014 8:23:00 PM

## 2014-03-23 NOTE — Rehab Progress Note ([facility]) (Signed)
[name]  MRN: [id number]  Account: [id number]  Session Start: 03/23/2014 12:00:00 AM  Session Stop: 03/23/2014 12:00:00 AM    Rehabilitation Nursing  Inpatient Rehabilitation Shift Assessment    Rehab Diagnosis: Cardiac debility  Demographics:            Age: 64Y            Gender: Female  Primary Language: English    Date of Onset:  02/26/14  Date of Admission: 03/14/2014 6:35:00 PM    Rehabilitation Precautions Restrictions:   Falls, Infection, sternal    Patient Report: I am nauseted  Pain: Patient currently without complaints of pain.  Pain Reassessment: Pain was not reassessed as no pain was reported.  Patient/Caregiver Goals:  To go home and live normally    NEURO  Orientation/Awareness: Alert and Oriented x4.  Speech: No deficits noted at this time.  Behavior: Cooperative.    MEDICATIONS  IV Access: Patient has IV access.     Type: Saline Lock  IV Gauge: 22  Location: RAC  Care Provided: Flushed with 10ml normal saline.   Date Inserted:   1/4  Dialysis Access: Patient does not have dialysis access.      Elopement Risk Level Assessment Tool  Patient Criteria: Patient is not capable of leaving the unit.  Assessment is not  applicable.      RISK ASSESSMENT FOR FALLS/INJURY    MENTAL STATUS CRITERIA:   0 - None identified.  MENTAL STATUS TOTAL: 0    AGE CRITERIA:   26 - < 19 years old  AGE TOTAL: 0    ELIMINATION CRITERIA:   0 - None identified.  ELIMINATION TOTAL: 0    HISTORY OF FALLS CRITERIA:   2 - Unknown History.  HISTORY OF FALLS TOTAL: 2    MEDICATIONS CRITERIA:   2 or more High Risk Medications (*see list below)   MEDICATIONS TOTAL: 2    PHYSICAL MOBILITY CRITERIA:   3 - Decreased balance reaction.   1 - Weakness/impaired physical mobility.   PHYSICAL MOBILITY TOTAL: 4    FALLS RISK ASSESSMENT TOTAL: 8    Patient's Fall Risk: TOTAL SCORE is <=10:  Low Risk.    Falls Interventions: Clutter removed and clear path to BR.  Call bell, phone, glasses, etc within reach.  Hourly toileting/safety checks  between 6am and 10pm, then every 2 hours.  Initiate Fall care plan and outcome.  Yellow "high risk" patient identification in place: wrist band, socks, chart  sticker, door sign.  Pt and family education.  Assistive devices at [facility].    NUTRITION  Diet:  Type: Consistent carbohydrate.  Food Consistency: Regular.  Liquid Consistency: Thin.      CARDIOVASCULAR     Right Lower Extremity  Nail Bed Color: Pink.   +1 edema   Left Lower Extremity  Nail Bed Color: Pink.   No edema or redness present.  Pulses:   Apical Pulse: Regular. Strong. Rate is 56 .   Patient does not have a pacemaker.   Patient does not have a defibrillator.    CARDIOPULMONARY  Lung Sounds:   Upper lobes. Clear.   Lower lobes. Clear.  Type of Respirations: Regular.  Cough: No cough noted.  Respiratory Support: The patient does not require any respiratory support.  Respiratory Equipment: None.    INTEGUMENTARY  Skin:  Temperature: Cool  Turgor: Elastic  Moisture: Dry  Color of skin: Normal for Race/Ethnicity  Capillary Refill: Less than 3 seconds  Wounds/Incisions:  Open wound. Right inner ankle /foot  Length: 15 centimeters    Width: 4 centimeters.    Depth: No, secondary to  necrotic tissue.  Open Surface Area:  60 cm  Undermining: No  Tunneling:  No  Peri-Wound Erythema: No  Skin Color:  Dry  skin  Open Wound Care: CLean with NS /Santyl daily  Braden Scale for Predicting Pressure Sore Risk: Sensory Perception: No  impairment  Moisture: Rarely moist  Activity: Chairfast  Mobility: No limitation  Nutrition: Adequate  Friction and Shear: Potential problem  Braden Score: 19  Level of Risk: No risk (19-23). Will reassess every shift.    GASTROINTESTINAL  Abdomen: Nontender.  Bowel Sounds:  Active bowel sounds audible in all four quadrants.  Date of Last Bowel Movement:  1/4   No Problems/Complaints with Bowel Elimination Assessed.    GENITOURINARY  Current Bladder Pattern: Continent  Color:  Yellow   Patient denies problems with urination and/or  catheter.    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 Patient is completely independent for eating.  There  are no activity limitations.    BLADDER MANAGEMENT - LEVEL OF ASSIST: Bladder Score = 7. Patient is completely  independent for bladder management. There are no activity limitations.    BLADDER ACCIDENTS THIS SHIFT:  0 . Patient has not had an accident this  assessment and did not require medications or devices.    BOWEL MANAGEMENT - LEVEL OF ASSIST: Bowel Score = 6.  Patient is modified  independent for bowel management.  Patient did not have bowel movement.  Medication/intervention was provided.    BOWEL ACCIDENTS THIS SHIFT: 0 . Patient has not had an accident, but used a  stool softener.    Education Provided:    Education Provided: Pain management. Pain scale. Medication options. Incentive  spirometry. Bed mobility. Safety issues and interventions.       Audience: Patient.       Mode: Explanation.       Response: Applied knowledge.  Verbalized understanding.    Discharge: Patient is not being discharged at this time.    Long Term Goals:  1. Patient will be able to demonstrate skill in wound care,  dressing changes and skin monitoring with out queing. - Goal Not Met   2. Patient will perform wound dressing changes independently. - Goal Not Met   3. Patient will demonstrate safe transfers from bed to wheelchair, bed to chair  and and bed to bsc using sternal precautions.   4. Patient will communicate her understandfing of s/s of  wound infection and  when to call the doctor.   5. Patient will demonstrate her understanding of DM and how to monitor BS, adm  insulin, and s/s of hypo and hyperglycemia. - Goal Met  Jan 11th (2 weeks)  Short Term Goals:  1. Patient will be able to call 100 percent of the time for  help when getting out of bed. She will communicate her understanding for  assistance. - Goal  Met   2. Patient will be able to, at the end of the week demonstrate good hand  washing techniques, and wound care for lower right extremity with reach bar if  needed. - Goal Not Met  January 4th, one week    PROGRESS TOWARD GOALS: Patient will discharge 1/8.    PLAN: Nursing Specific Interventions  Pain Management. Skin Management. Wound Management.  Continue with the current Nursing Plan of Care.    TEAM CARE PLAN  Identified problems from team documentation:  Problem: Impaired Endocrine/Metabolic Function  Endocrine: Primary Team Goal: Patient will demonstrate understanding of DM  management to include adm of insulin, s/s of hypo/ hyper glycemia./Active    Problem: Impaired Mobility  Mobility: Primary Team Goal: Patient will perform household functional mobility  mod I using LRAD in order to ensure safe d/c home./Met    Problem: Impaired Psychosocial Skills/Behavior  PsychoSocial: Primary Team Goal: Pt will demonstrate coping skills for adjusting  to post-cardiac surgery recovery as reflected in positive expectations for  continuing to increase levels of functional independence/Active    Problem: Impaired Self-care Mgmt/ADL/IADL  Self Care: Primary Team Goal: Pt will be Mod I in ADL and functional mobility  and adhere to sternal precautions 100% of the time with no cuing./Active    Problem: Impaired Skin/Wound Mgmt  Skin Wound: Primary Team Goal: Patient and caregiver will be independent of  wound care/Active    Add/Update Problems from this assessment:  No updates at this time.    Please review Integrated Patient View Care Plan Flowsheet for Team identified  Problems, Interventions, and Goals.    Signed by: Dolores Hoose, RN 03/23/2014 11:51:00 PM

## 2014-03-23 NOTE — Rehab Progress Note ([facility]) (Signed)
[name]  MRN: [id number]  Account: [id number]  Session Start: 03/23/2014 12:00:00 AM  Session Stop: 03/23/2014 12:00:00 AM    Case Management  Inpatient Rehabilitation Family Conference Note    Rehab Diagnosis: Cardiac debility  Demographics:            Age: 38Y            Gender: Female    Individuals Present at Conference     Staff: Physician. Case Manager. Physical Therapist. Occupational Therapist.     Family members and significant others:  Pt., daughter [name], son [name],  other daughter ( ?? DIL) 5 month grandson [name].    Issues Discussed During Conference:   Estimated discharge date: Fri. 03/25/2014 with [facility].   Medical Issues: Patient/significant others were educated about patient's  diagnosis, deficits and needs.   Rehabilitation program was reviewed including the role of the patient and  significant others in the rehabilitation program.   Adaptation to impairments.   Durable Medical Equipment: Therapy discussed sternal px. at length as well as  use of equipment. .    Education Provided:    Education Provided: Rehab techniques and procedures. Therapy did a lot of  education re: Sternal Px. Safety issues and interventions. Supervision  requirements. Use of adaptive devices. Skin/wound care. Signs/symptoms of  infection. Role of nutrition in wound prevention/healing. Bed mobility. Safety.       Audience: Patient and Caregiver       Mode: Explanation.  Demonstration.       Response: Applied knowledge.  Verbalized understanding.  Needs reinforcement.    Comments:   Pt. has a number of ongoing chronic health issues and will need to  follow up with a number of physicians. She will likely require a skin graft.  Needs to go for follow up outpt wound center. [facility])    Signed by: Antonietta Breach, RN 03/23/2014 5:22:00 PM

## 2014-03-23 NOTE — Rehab Progress Note ([facility]) (Signed)
[name]  MRN: [id number]  Account: [id number]  Session Start: 03/23/2014 8:00:00 AM  Session Stop: 03/23/2014 9:00:00 AM    Occupational Therapy  Inpatient Rehabilitation Progress Note - Brief    Rehab Diagnosis: Cardiac debility  Demographics:            Age: 1Y            Gender: Female  Rehabilitation Precautions/Restrictions:   Falls, Infection, sternal    SUBJECTIVE  Pain: Patient currently has pain.  Patient reports a pain level of 6 out of 10. Rn notified, pain medication  administered    OBJECTIVE  Functional Measures      GROOMING: Grooming Score = 7.  Patient is completely independent for grooming.  There are no activity limitations.    BATHING: Patient bathed in shower. Bathing Score = 5.  Patient is  supervision/set-up for bathing, requiring: Verbal cuing, prompting, or  instructing.  Patient requires the following assistive device(s): Hand held shower.    UPPER BODY DRESSING: Upper Body Dressing Score = 5. Patient is  supervision/set-up for upper body dressing, requiring: Gathering/setting out  clothes.  Patient requires the following assistive device(s):  No assistive devices were  required.    LOWER BODY DRESSING: Lower Body Dressing Score = 5. Patient is  supervision/set-up for lower body dressing, requiring: Standing by.  Gathering/setting out clothes.  Patient requires the following assistive device(s): No assistive devices were  required.    TRANSFER SHOWER: Shower Transfer Score = 5.  Patient is supervision/set-up for  transferring to and from the shower, requiring: Verbal cuing, prompting, or  instructing. Walker.  Shower chair.    Interventions:       Self Care/Home Management:  OT treatment focused on therapeutic engagement  in self-care routine. Pt ambulated to/from bathroom using RW with supervision.  Performed a shower seated on shower bench with min verbal cues for safety during  transfer. Pt performed dressing at wheelchair level due to increased right LE  pain. Please see functional  measures above for performance. Pt left supine in  bed, all needs within reach, hand off to RN.  Pain Reassessment: Pt continues to report 6/10 pain to sternum and right LE,  repositioned for comfort    Education Provided:    Education Provided: Precautions. Activities of daily living. Functional  transfers. Safety.       Audience: Patient.       Mode: Explanation.       Response: Needs practice.  Needs reinforcement.    ASSESSMENT  [name] is progressing toward mod I for self-care routine, but continues to  require occasional cues for safety and sternal precautions during transfers.  Pt's son will be in the home upon D/C to supervise and ensure pt's safety during  bathing routine. Continue OT POC.    PLAN  Continued Occupational Therapy is recommended.  Recommended Frequency/Duration/Intensity: 60-120 minutes, 5-6x week for 7-10  days from initial eval on 03/15/14  Continued Activities Contributing Toward Care Plan: ADL/IADL retraining, AE  training, sternal precautions, TherEx, TherAct, d/c planning    3 Hour Rule Minutes: 60 minutes of OT treatment this session count towards  intensity and duration of therapy requirement. Patient was seen for the full  scheduled time of OT treatment this session.  Therapy Mode Minutes: Individual: 60 minutes.    Signed by: Fransisco Beau, OTR/L 03/23/2014 9:00:00 AM

## 2014-03-24 LAB — CBC AND DIFFERENTIAL
Basophils Absolute Automated: 0.03 10*3/uL (ref 0.00–0.20)
Basophils Automated: 1 %
Eosinophils Absolute Automated: 0.39 10*3/uL (ref 0.00–0.70)
Eosinophils Automated: 8 %
Hematocrit: 29.5 % — ABNORMAL LOW (ref 37.0–47.0)
Hgb: 8.9 g/dL — ABNORMAL LOW (ref 12.0–16.0)
Immature Granulocytes Absolute: 0.01 10*3/uL
Immature Granulocytes: 0 %
Lymphocytes Absolute Automated: 1.32 10*3/uL (ref 0.50–4.40)
Lymphocytes Automated: 28 %
MCH: 26.7 pg — ABNORMAL LOW (ref 28.0–32.0)
MCHC: 30.2 g/dL — ABNORMAL LOW (ref 32.0–36.0)
MCV: 88.6 fL (ref 80.0–100.0)
MPV: 9.8 fL (ref 9.4–12.3)
Monocytes Absolute Automated: 0.6 10*3/uL (ref 0.00–1.20)
Monocytes: 13 %
Neutrophils Absolute: 2.31 10*3/uL (ref 1.80–8.10)
Neutrophils: 50 %
Nucleated RBC: 0 /100 WBC (ref 0–1)
Platelets: 272 10*3/uL (ref 140–400)
RBC: 3.33 10*6/uL — ABNORMAL LOW (ref 4.20–5.40)
RDW: 21 % — ABNORMAL HIGH (ref 12–15)
WBC: 4.65 10*3/uL (ref 3.50–10.80)

## 2014-03-24 LAB — RENAL FUNCTION PANEL
Albumin: 2.8 g/dL — ABNORMAL LOW (ref 3.5–5.0)
Anion Gap: 7 (ref 5.0–15.0)
BUN: 36 mg/dL — ABNORMAL HIGH (ref 7–19)
CO2: 24 mEq/L (ref 22–29)
Calcium: 8.9 mg/dL (ref 8.5–10.5)
Chloride: 107 mEq/L (ref 100–111)
Creatinine: 2 mg/dL — ABNORMAL HIGH (ref 0.6–1.0)
Glucose: 103 mg/dL — ABNORMAL HIGH (ref 70–100)
Phosphorus: 3.8 mg/dL (ref 2.3–4.7)
Potassium: 5.1 mEq/L (ref 3.5–5.1)
Sodium: 138 mEq/L (ref 136–145)

## 2014-03-24 LAB — GLUCOSE WHOLE BLOOD - POCT
Whole Blood Glucose POCT: 108 mg/dL — ABNORMAL HIGH (ref 70–100)
Whole Blood Glucose POCT: 153 mg/dL — ABNORMAL HIGH (ref 70–100)
Whole Blood Glucose POCT: 121 mg/dL — ABNORMAL HIGH (ref 70–100)
Whole Blood Glucose POCT: 113 mg/dL — ABNORMAL HIGH (ref 70–100)

## 2014-03-24 LAB — GFR: EGFR: 30.3

## 2014-03-24 NOTE — Progress Notes (Addendum)
[facility]    History:    -CKD III, baseline Cr 1.6-1.7 mg/dL  -AKI, hemodynamically mediated  -LE edema  -HTN with CKD   -Anemia with CKD  -3 vessels CAD; s/p cath and CABG   -PVD with right foot ulcer  -metabolic acidosis    Assessment/Plan:    GFR at baseline, BP generally well controlled.    Scheduled Meds:  Current Facility-Administered Medications   Medication Dose Route Frequency   . amiodarone  200 mg Oral Daily   . amLODIPine  10 mg Oral Daily   . aspirin EC  325 mg Oral Daily   . atorvastatin  10 mg Oral QHS   . calcitRIOL  0.25 mcg Oral Daily   . carvedilol  25 mg Oral Q12H [facility]   . collagenase   Topical Q24H   . docusate sodium  100 mg Oral Daily   . furosemide  20 mg Oral Daily   . gabapentin  300 mg Oral BID   . heparin (porcine)  5,000 Units Subcutaneous Q12H [facility]   . hydrALAZINE  100 mg Oral TID   . insulin aspart  3 Units Subcutaneous Once   . lidocaine  2 patch Transdermal Q24H   . venlafaxine  75 mg Oral BID       Continuous Infusions:     PRN Meds:acetaminophen, albuterol-ipratropium, bisacodyl, dextrose, diphenhydrAMINE, diphenhydrAMINE-zinc acetate, glucagon (rDNA), insulin aspart, magnesium hydroxide, oxyCODONE-acetaminophen, polyethylene glycol, traMADol    Subjective:    Responsive and comfortable  HEENT: no visual complaint, cough, hearing deficit  Lungs: no cough, SOB  CV: No chest pain, palpitations  Abd: No constipation, pain, N&V  Ext: no pain    Objective:  Vital signs in last 24 hours:  Temp:  [97.5 F (36.4 C)-97.9 F (36.6 C)] 97.9 F (36.6 C)  Heart Rate:  [56-78] 78  Resp Rate:  [18] 18  BP: (144-158)/(60-69) 158/63 mmHg    Labs:    Recent Labs  Lab 03/24/14  0424 03/22/14  0520 03/19/14  0605   GLUCOSE 103* 118* 99   BUN 36* 35* 36*   CREATININE 2.0* 1.9* 2.1*   CALCIUM 8.9 9.0 8.9   SODIUM 138 139 143   POTASSIUM 5.1 4.9 5.0   CHLORIDE 107 108 109   CO2 24 22 25    ALBUMIN 2.8* 2.8* 2.8*   PHOSPHORUS 3.8 3.8 3.7        Recent Labs  Lab 03/24/14  0424 03/22/14  0520 03/19/14  0605   WBC 4.65 4.95 5.16   HEMOGLOBIN 8.9* 9.0* 8.9*   HEMATOCRIT 29.5* 30.2* 29.7*   MCV 88.6 88.6 88.9   MCH, POC 26.7* 26.4* 26.6*   MCHC 30.2* 29.8* 30.0*   RDW 21* 21* 22*   MPV 9.8 9.8 9.3*   PLATELETS 272 276 271       Weight:  Wt Readings from Last 4 Encounters:   03/14/14 70.852 kg (156 lb 3.2 oz)       Intake/Output:    Intake/Output Summary (Last 24 hours) at 03/24/14 1412  Last data filed at 03/23/14 2000   Gross per 24 hour   Intake    240 ml   Output      0 ml   Net    240 ml       Vital Signs:  Patient Vitals for the past 24 hrs:   BP Temp Pulse Resp SpO2   03/24/14 0838 158/63 mmHg - 78 -  96 %   03/24/14 0539 147/69 mmHg 97.9 F (36.6 C) 74 18 94 %   03/23/14 2018 144/60 mmHg 97.5 F (36.4 C) (!) 56 18 97 %       Physical Exam:    General appearance and mental status - in no distress  Eyes - pupils equal, sclera clear  Neck - supple  Chest - clear to auscultation, no wheezes, rales or rhonchi  Heart - regular rate and rhythm, no murmurs or rubs  Abdomen - soft, nontender, no masses or organomegaly  Extremities - no edema  Skin - no rashes    Verlene Mayer, MD  [facility] [employer].  [phone]

## 2014-03-24 NOTE — Rehab Discharge Summary ([facility]) (Signed)
[name]  MRN: [id number]  Account: [id number]  Session Start: 03/24/2014 9:00:00 AM  Session Stop: 03/24/2014 10:00:00 AM    Occupational Therapy  Inpatient Rehabilitation Discharge Summary and Note    Rehab Diagnosis: Cardiac debility  Demographics:            Age: 64Y            Gender: Female    Past Medical History: Diabetes mellitus  Chronic kidney disease  Myocardial infarction  Hypertension  Hyperlipidemia  Peripheral arterial disease 06/28/13 right superficial femoral artery stenosis,  tibial peroneal trunk stenosis, left CVL a stenosis, angioplasty  Coronary artery disease  Left circumflex artery presumed DES, Chronically occluded right coronary artery  stent  Anemia  Arterial leg ulcer 2014, receiving home health  Chronic obstructive pulmonary disease    PAST SURGICAL HISTORY  Cardiac angiography and angioplasty   06/2013  Colonoscopy  Tubal ligation  History of Present Illness: 64 year old female with PMH DM2, PAD, HTN, CKD,  COPD, CAD s/p RCA stent initially admitted [facility] with syncope  02/26/14. Found to have NSTEMI and cardiac cath revealed multivessel CAD.  Transferred [facility] 02/28/14 and subsequently underwent CABG x2 on 12/18. Patient  also with severe PAD with chronic RLE arterial ulcer, s/p angioplasty in 06/2013;  clinically improving  but has not had wound care recently. Prior to hosp  admission patient had not been taking her Spiriva or Symbicort for COPD; resumed  in hospital. She is also being treated for asymptomatic bacteriuria and iron  deficiency anemia. She is DM2 and her Hgb A1C 7.1.   Date of Onset: 02/26/14   Date of Admission: 03/14/2014 6:35:00 PM  Rehabilitation Precautions/Restrictions:   Falls, Infection, sternal    SUBJECTIVE  Pain: Patient currently has pain. Pt reports 8/10 right LE and sternal pain. Pt  was premedicated for therapy and repositioned for comfort, resting quietly  following OT session    OBJECTIVE  General Observation: Pt received supine in bed with  right LE elevated, Lidoderm  patches in place on chest.  Cognition: Pt is alert and oriented x4/4, follows commands consistently. Pt is  able to verbalize her sternal precautions, but requires intermittent prompting  to adhere to precautions during functional tasks. Pt does not fully appreciate  her current medical status and at times lacks safety awareness during functional  transfers.    Functional Measures    EATING: Eating Score = 7. Patient is completely independent for eating.  There  are no activity limitations.    GROOMING: Grooming Score = 7.  Patient is completely independent for grooming.  There are no activity limitations.    BATHING: Patient bathed in shower. Bathing Score = 6.  Patient is modified  independent for bathing, requiring: Safety considerations. Grab bar/arm rest to  maintain balance. Hand held shower.    UPPER BODY DRESSING: Upper Body Dressing Score = 6 Patient is modified  independent for upper body dressing, requiring: Safety considerations.    LOWER BODY DRESSING: Lower Body Dressing  Score = 6. Patient is modified  independent for lower body dressing, requiring: Safety considerations.    TOILETING: Toileting Score = 6.  Patient is modified independent for toileting,  requiring: Adaptive device to maintain balance.    BLADDER MANAGEMENT - LEVEL OF ASSIST: Bladder Score = 7. Patient is completely  independent for bladder management. There are no activity limitations.    BLADDER ACCIDENTS THIS SHIFT:  0 . Patient has not had an accident  this  assessment and did not require medications or devices.    TRANSFER TOILET: Toilet Transfer Score = 6.  Patient is modified independent for  transferring to and from the toilet/commode, requiring: Safety considerations.  Walker.    TRANSFER SHOWER: Shower Transfer Score = 5.  Patient is supervision/set-up for  transferring to and from the shower, requiring: Stand by assistance. Walker.  Shower chair.    COMPREHENSION: Auditory comprehension is the  usual mode. Comprehension Score =  7, Independent.  Patient comprehends complex/abstract information in their  primary language.  Patient is completely independent for auditory comprehension.   There are no activity limitations.    EXPRESSION: Vocal expression is the usual mode. Expression Score = 7,  Independent.  Patient expresses complex/abstract information in their primary  language.  Patient is completely independent for vocal expression.  There are no  activity limitations.    SOCIAL INTERACTION: Social Interaction Score = 7, Independent. Patient is  completely independent for social interaction.  There are no activity  limitations.    PROBLEM SOLVING: Patient does not make appropriate decisions in order to solve  complex problems without assistance from a helper. Problem Solving Score = 5,  Supervision.  Patient makes appropriate decisions in order to solve routine  problems with directing only under stressful or unfamiliar conditions, but no  more than 10% of the time, for the following behavior(s): Recognizing risks    MEMORY: Memory Score = 7, Independent.  Patient is completely independent for  memory.  There are no activity limitations.    Today's Treatment Interventions:       Self Care/Home Management:  OT treatment addressed safety and progression  to mod I during self-care routine. Pt gathered clothes and performed functional  mobility with mod I at wheelchair level due to foot pain. OT provided educations  concerning the importance of adherence to sternal precautions during ADL and  functional transfers, as well as home safety measures. Please see functional  measures above for performance. Engaged pt in repeated sit to stands from  progressively lower mat height with emphasis on sternal precautions and safe  technique. Pt left seated in wheelchair, all needs within reach.  Equipment Provided/Ordered: Pt has ordered a tub transfer bench.    Education Provided:    Education Provided: Precautions.  Activities of daily living. Functional  transfers. Safety.       Audience: Patient.       Mode: Explanation.  Demonstration.       Response: Needs reinforcement.    ASSESSMENT  Summary of Progress and Current Status: Pt has made excellent progress during  her rehab stay. She has progressed to mod I with the majority of her self-care  routine, but continues to benefit from distant supervision during shower  transfers to ensure safety in a wet environment. Pt's son and daughter  participated in a family meeting and verbalized that they will provide the  necessary supervision to ensure pt's safety in the home during self-care. Pt  primarily uses a RW for short distances during ADL, but utilizes wheelchair when  her right foot pain increases to avoid weight-bearing through the UE on the RW.  Pt is able to independently identify when she is unsafe to use the RW for  mobility in order to adhere to sternal precautions. Pt will benefit from home  health OT to evaluate and improve home safety, as well as to address IADL  independence, adherence to sternal precaution, activity tolerance, and  functional  mobility with the RW.    Long Term Goals (status prior to discharge): Pt will be Mod I in ADL routine -  Goal Not Met  Pt will be Mod I in functional mobility, DME PRN - Goal Not Met  Pt will adhere to sternal precautions 100% of the time with no cuing - Goal Not  Met  Pt will be Independent in bed mobility from flat surface with no cues to adhere  to sternal precautions. - Goal Not Met  Pt will complete cooking task at stove top with S, DME PRN. - Goal Not Met  7-10 days from initial eval on 03/15/14  Short Term Goals (status prior to discharge):    Progress Towards Goals (final status):   LONG TERM GOAL REVIEW:       1. Pt will be Mod I in ADL routine - Met met with the exception of  supervision for shower transfer       2. Pt will be Mod I in functional mobility, DME PRN - Met        3. Pt will adhere to sternal precautions  100% of the time with no cuing -  Not Met requires occasional cuing during transfers       4. Pt will be Independent in bed mobility from flat surface with no cues to  adhere to sternal precautions. - Met       5. Pt will complete cooking task at stove top with S, DME PRN. - Met    PLAN  Occupational Therapy Plan: Patient is recommended for home health therapy  services.    Team Care Plan  Please review Integrated Patient View Care Plan Flowsheet for Team identified  Problems, Interventions, and Goals.    Identified problems from team documentation:  Problem: Impaired Endocrine/Metabolic Function  Endocrine: Primary Team Goal: Patient will demonstrate understanding of DM  management to include adm of insulin, s/s of hypo/ hyper glycemia./Active    Problem: Impaired Mobility  Mobility: Primary Team Goal: Patient will perform household functional mobility  mod I using LRAD in order to ensure safe d/c home./Met    Problem: Impaired Psychosocial Skills/Behavior  PsychoSocial: Primary Team Goal: Pt will demonstrate coping skills for adjusting  to post-cardiac surgery recovery as reflected in positive expectations for  continuing to increase levels of functional independence/Active    Problem: Impaired Self-care Mgmt/ADL/IADL  Self Care: Primary Team Goal: Pt will be Mod I in ADL and functional mobility  and adhere to sternal precautions 100% of the time with no cuing./Active    Problem: Impaired Skin/Wound Mgmt  Skin Wound: Primary Team Goal: Patient and caregiver will be independent of  wound care/Active    Status update for discharge:     Self Care Management:   Primary Goal: Not Met   Discharge Status Comment: Pt continues to require occasional cues for sternal  precautions during transfers. Pt's family has been educated concerning sternal  precautions and will be present in the home to ensure compliance and safety.    3 Hour Rule Minutes: 60 minutes of OT treatment this session count towards  intensity and duration  of therapy requirement. Patient was seen for the full  scheduled time of OT treatment this session.  Therapy Mode Minutes: Individual: 60 minutes.    Signed by: Fransisco Beau, OTR/L 03/24/2014 10:00:00 AM

## 2014-03-24 NOTE — Progress Notes (Signed)
[facility] Physiatry Attending Face to Face Progress Note   Functional Status/Update:   I reviewed patient's therapy notes to assess functional status and ongoing need for therapies. Of note, observed in tx.  Doing well.     Subjective:   No headache. Sleeping well. No constipation. No chest pain. No shortness of breath.     Objective:   Filed Vitals:    03/23/14 1335 03/23/14 2018 03/24/14 0539 03/24/14 0838   BP: 139/68 144/60 147/69 158/63   Pulse: 68 56 74 78   Temp:  97.5 F (36.4 C) 97.9 F (36.6 C)    TempSrc:       Resp:  18 18    SpO2:  97% 94% 96%       Physical Examination:   Normal body habitus.   Anicteric sclerae. Conjunctivae non-injected. EOMI.   Hearing grossly intact. Moist mucous membranes.   +S1S2 Heart rate and rhythm are regular. No significant lower limb edema.   Lungs are clear to auscultation bilaterally. No wheezes, rales, or rhonchi. Good respiratory effort.   Soft. Non-tender. Normoactive bowel sounds.   Alert Pleasant and cooperative Oriented x 3     New Labs:  Results     Procedure Component Value Units Date/Time    Glucose Whole Blood - POCT [[id number]]  (Abnormal) Collected:  03/24/14 1637     POCT - Glucose Whole blood 121 (H) mg/dL Updated:  69/62/95 2841    Glucose Whole Blood - POCT [[id number]]  (Abnormal) Collected:  03/24/14 1118     POCT - Glucose Whole blood 153 (H) mg/dL Updated:  25/36/64 4034    Glucose Whole Blood - POCT [[id number]]  (Abnormal) Collected:  03/24/14 0537     POCT - Glucose Whole blood 108 (H) mg/dL Updated:  75/64/33 2951    GFR [[id number]] Collected:  03/24/14 0424     EGFR 30.3 Updated:  03/24/14 0541    Renal function panel [[id number]]  (Abnormal) Collected:  03/24/14 0424    Specimen Information:  Blood Updated:  03/24/14 0541     Glucose 103 (H) mg/dL      Sodium 355 mEq/L      Potassium 5.1 mEq/L      Chloride 107 mEq/L      CO2 24 mEq/L      BUN 36 (H) mg/dL      CALCIUM 8.9 mg/dL      Creatinine 2.0 (H) mg/dL      Albumin 2.8 (L) g/dL      Phosphorus 3.8  mg/dL      Anion Gap 7.0     CBC and differential [[id number]]  (Abnormal) Collected:  03/24/14 0424    Specimen Information:  Blood / Blood Updated:  03/24/14 0513     WBC 4.65 x10 3/uL      Hgb 8.9 (L) g/dL      Hematocrit 70.6 (L) %      Platelets 272 x10 3/uL      RBC 3.33 (L) x10 6/uL      MCV 88.6 fL      MCH 26.7 (L) pg      MCHC 30.2 (L) g/dL      RDW 21 (H) %      MPV 9.8 fL      Neutrophils 50 %      Lymphocytes Automated 28 %      Monocytes 13 %      Eosinophils Automated 8 %      Basophils Automated 1 %  Immature Granulocyte 0 %      Nucleated RBC 0 /100 WBC      Neutrophils Absolute 2.31 x10 3/uL      Abs Lymph Automated 1.32 x10 3/uL      Abs Mono Automated 0.60 x10 3/uL      Abs Eos Automated 0.39 x10 3/uL      Absolute Baso Automated 0.03 x10 3/uL      Absolute Immature Granulocyte 0.01 x10 3/uL     Glucose Whole Blood - POCT [[id number]]  (Abnormal) Collected:  03/23/14 2021     POCT - Glucose Whole blood 157 (H) mg/dL Updated:  40/98/11 9147    Wound culture & gram stain [[id number]] Collected:  03/22/14 1600    Specimen Information:  Wound / Drainage Updated:  03/23/14 1948    Narrative:      ORDER#: [id number]                                    ORDERED BY: KIM, SEA HUN  SOURCE: Drainage right lower leg wound drainage      COLLECTED:  03/22/14 16:00  ANTIBIOTICS AT COLL.:                                RECEIVED :  03/22/14 18:24  Stain, Gram                                FINAL       03/22/14 21:48  03/22/14   Few Gram positive cocci             Few Gram negative rods             Moderate Gram positive rods             No Squamous epithelial cells seen             No WBCs seen  Culture and Gram Stain, Aerobic, Wound     PRELIM      03/23/14 19:47   +  03/23/14   Heavy growth of mixed cutaneous flora  03/23/14   Heavy growth of Gram negative rod               Identification and susceptibility to follow    03/23/14   Heavy growth of Gram negative rod               Second morphotype              Identification and susceptibility to follow              Current medications:   Scheduled Meds:  Current Facility-Administered Medications   Medication Dose Route Frequency   . amiodarone  200 mg Oral Daily   . amLODIPine  10 mg Oral Daily   . aspirin EC  325 mg Oral Daily   . atorvastatin  10 mg Oral QHS   . calcitRIOL  0.25 mcg Oral Daily   . carvedilol  25 mg Oral Q12H [facility]   . collagenase   Topical Q24H   . docusate sodium  100 mg Oral Daily   . furosemide  20 mg Oral Daily   . gabapentin  300 mg Oral BID   . heparin (porcine)  5,000 Units Subcutaneous Q12H [facility]   .  hydrALAZINE  100 mg Oral TID   . insulin aspart  3 Units Subcutaneous Once   . lidocaine  2 patch Transdermal Q24H   . venlafaxine  75 mg Oral BID     PRN Meds:.acetaminophen, albuterol-ipratropium, bisacodyl, dextrose, diphenhydrAMINE, diphenhydrAMINE-zinc acetate, glucagon (rDNA), insulin aspart, magnesium hydroxide, oxyCODONE-acetaminophen, polyethylene glycol, traMADol    Assessment: 64 y.o. female with Debilitated    Plan:   REHAB: Continue comprehensive and intensive inpatient rehab program, including:   Physical therapy 60-120 min daily, 5-6 times per week, Occupational therapy  60-120 min daily, 5-6 times per week, Case management and Rehabilitation nursing    Will continue to address the following impairments and issues:  Mobility, ADLs, Impaired strength, Impaired ROM, Impaired endurance, Medication management, Impaired balance and Coping strategies  Records reviewed.  Bld Glu levels under adequate control.  Anemia and renal function need trending as an out patient.   Discussed with the pt re: status and tx.  Tentative d/c tomorrow.

## 2014-03-24 NOTE — Progress Notes (Signed)
Home Health Referral          Referral from Jena Gauss, RN (Case Manager) for home health care upon discharge.    By [employer], the patient has the right to freely choose a home care provider.  Arrangements have been made with:     A company of the patients choosing. We have supplied the patient with a listing of providers in your area who asked to be included and participate in Medicare.   [facility], a home care agency that provides both adult home care services which is a wholly owned and operated by [employer] and participates in [employer]   The preferred provider of your insurance company. Choosing a home care provider other than your insurance company's preferred provider may affect your insurance coverage.    The Home Health Care Referral Form acknowledging the voluntary selection of the home care company has been completed, signed, and is on file.      Home Health Discharge Information     Your doctor has ordered Skilled Nursing, Physical Therapy and Occupational Therapy in-home service(s) for you while you recuperate at home, to assist you in the transition from hospital to home.      The agency that you or your representative chose to provide the service:  Name of Home Health Agency: [facility] [phone] for RN, PT, OT services     The above services were set up by:  Emiliano Dyer  [facility] Liaison)   Phone        [phone]     Signed by: Emiliano Dyer  Date Time: 03/24/2014 8:21 AM

## 2014-03-24 NOTE — Rehab Progress Note ([facility]) (Signed)
[name]  MRN: [id number]  Account: [id number]  Session Start: 03/24/2014 12:00:00 AM  Session Stop: 03/24/2014 12:00:00 AM    Rehabilitation Nursing  Inpatient Rehabilitation Shift Assessment    Rehab Diagnosis: Cardiac debility  Demographics:            Age: 64Y            Gender: Female  Primary Language: English    Date of Onset:  02/26/14  Date of Admission: 03/14/2014 6:35:00 PM    Rehabilitation Precautions Restrictions:   Falls, Infection, sternal    Patient Report: Hi, I feel okay"  Pain: Patient currently has pain.  Location: Sternal area  Type: Acute  Quality: Aching.  Pain Scale: Numeric.  Patient reports a pain level of 7 out of 10.  Patient's acceptable level of pain 0 out of 10.   Interferes with physical activity.  Pain is alleviated by: Medication  Pain is exacerbated by: Movements Patient medicated.  Pain Reassessment:  Response to Pain Intervention: Relieved  Post Intervention Pain Quality:  None.  Patient Reports Post Intervention Pain Level of: 1 out of 10  Pain Acceptable: Yes  Patient/Caregiver Goals:  To go home and live normally    NEURO  Orientation/Awareness: Alert and Oriented x4.  Speech: No deficits noted at this time.  Behavior: Cooperative.    MEDICATIONS  IV Access: Patient has IV access.     Type: Saline Lock  IV Gauge: 22  Location: RA  Care Provided: Flushed after medication with 10ml normal saline.   Date Inserted:   03/21/14  Dialysis Access: Patient does not have dialysis access.      Elopement Risk Level Assessment Tool  Patient Criteria: Patient is not capable of leaving the unit.  Assessment is not  applicable.      RISK ASSESSMENT FOR FALLS/INJURY    MENTAL STATUS CRITERIA:   0 - None identified.  MENTAL STATUS TOTAL: 0    AGE CRITERIA:   7 - < 82 years old  AGE TOTAL: 0    ELIMINATION CRITERIA:   3 - Toileting with Assistance.   3 - Toileting with Assistance.   ELIMINATION TOTAL: 3    HISTORY OF FALLS CRITERIA:   2 - Unknown History.  HISTORY OF FALLS TOTAL:  2    MEDICATIONS CRITERIA:   2 or more High Risk Medications (*see list below)   MEDICATIONS TOTAL: 2    PHYSICAL MOBILITY CRITERIA:   3 - Decreased balance reaction.   1 - Weakness/impaired physical mobility.   PHYSICAL MOBILITY TOTAL: 4    FALLS RISK ASSESSMENT TOTAL: 11    Patient's Fall Risk: TOTAL SCORE >10: High Risk    Falls Interventions: Clutter removed and clear path to BR.  Call bell, phone, glasses, etc within reach.  Hourly toileting/safety checks between 6am and 10pm, then every 2 hours.  Yellow "high risk" patient identification in place: wrist band, socks, chart  sticker, door sign.  Pt and family education.  Assistive devices at [facility].  Pharmacy review of meds.    NUTRITION  Diet:  Type: Consistent carbohydrate.  Food Consistency: Regular.  Liquid Consistency: Thin.      CARDIOVASCULAR     Bilateral lower extremities  Nail Bed Color: Pink.   + 1 edema to LLE  Pulses:   Apical Pulse: Regular. Strong. Rate is 78 .   Patient does not have a pacemaker.   Patient does not have a defibrillator.    CARDIOPULMONARY  Lung  Sounds:   Upper lobes. Clear.   Lower lobes. Clear.  Type of Respirations: Regular.  Cough: No cough noted.  Respiratory Support: The patient does not require any respiratory support.  Respiratory Equipment: 96%, RA    INTEGUMENTARY  Skin:  Temperature: Warm  Turgor: Normal for age  Moisture: Dry  Color of skin: Normal for Race/Ethnicity  Capillary Refill: Less than 3 seconds  Wounds/Incisions:     Open wound. Right foot Tissue Type: Slough- Tissue Type: Epithelial Tissue.  Tissue Type: Granulation Tissue.  Length: 15 centimeters    Width: 4 centimeters.    Depth: No, secondary to  necrotic tissue.  Open Surface Area:  60 cm  Undermining: No  Tunneling:  No  Peri-Wound Erythema: No  Skin Color:  Dry  skin Scaly skin  Open Wound Care: Santyl wirth daily TX.     Surgical Incision: Thoracic incision. Length: 17 centimeter(s) with glue. No  signs of infection.  Drainage: Incision without  drainage.  Odor:  No  Incision Care: Incision healing  Braden Scale for Predicting Pressure Sore Risk: Sensory Perception: No  impairment  Moisture: Rarely moist  Activity: Chairfast  Mobility: No limitation  Nutrition: Adequate  Friction and Shear: Potential problem  Braden Score: 19  Level of Risk: No risk (19-23). Will reassess every shift.    GASTROINTESTINAL  Abdomen: Soft. Nontender.  Bowel Sounds:  Active bowel sounds audible in all four quadrants.  Date of Last Bowel Movement:  03/21/14   No Problems/Complaints with Bowel Elimination Assessed.    GENITOURINARY  Current Bladder Pattern: Continent  Color:  Yellow   Patient denies problems with urination and/or catheter.    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 Patient is completely independent for eating.  There  are no activity limitations.    BLADDER MANAGEMENT - LEVEL OF ASSIST: Bladder Score = 7. Patient is completely  independent for bladder management. There are no activity limitations.    BLADDER ACCIDENTS THIS SHIFT:  0 . Patient has not had an accident this  assessment and did not require medications or devices.    BOWEL MANAGEMENT - LEVEL OF ASSIST: Bowel Score = 6. Patient is modified  independent for bowel management requiring: Stool softeners    BOWEL ACCIDENTS THIS SHIFT: 0 . Patient has not had an accident and did not  require medications or devices.    Education Provided:    Education Provided: Precautions. Pain management. Pain scale. Medication  options. Side effects. Clinical indicators of pain. Safety issues and  interventions. Fall protocol. Skin/wound care. Signs/symptoms of infection. Role  of nutrition in wound prevention/healing. Prevention of skin breakdown.  Medication. Name and dosage. Administration. Purpose. Side Effects.       Audience: Patient.       Mode: Explanation.       Response: Verbalized  understanding.    Discharge: Patient is being discharged at this time.    Long Term Goals:  1. Patient will be able to demonstrate skill in wound care,  dressing changes and skin monitoring with out queing. - Goal Not Met   2. Patient will perform wound dressing changes independently. - Goal Not Met   3. Patient will demonstrate safe transfers from bed to wheelchair, bed to chair  and and bed to bsc using sternal precautions.   4. Patient will communicate her understandfing of s/s  of  wound infection and  when to call the doctor.   5. Patient will demonstrate her understanding of DM and how to monitor BS, adm  insulin, and s/s of hypo and hyperglycemia. - Goal Met  Jan 11th (2 weeks)  Short Term Goals:  1. Patient will be able to call 100 percent of the time for  help when getting out of bed. She will communicate her understanding for  assistance. - Goal Met   2. Patient will be able to, at the end of the week demonstrate good hand  washing techniques, and wound care for lower right extremity with reach bar if  needed. - Goal Not Met  January 4th, one week    PROGRESS TOWARD GOALS: Pt medicated for pain of 7/10 to sternal area. Wound care  to right foot done, Pt unable to perform woundcare independently at this time.    PLAN: Nursing Specific Interventions  Pain Management. Skin Management. Wound Management.  Continue with the current Nursing Plan of Care.    TEAM CARE PLAN  Identified problems from team documentation:  Problem: Impaired Endocrine/Metabolic Function  Endocrine: Primary Team Goal: Patient will demonstrate understanding of DM  management to include adm of insulin, s/s of hypo/ hyper glycemia./Active    Problem: Impaired Mobility  Mobility: Primary Team Goal: Patient will perform household functional mobility  mod I using LRAD in order to ensure safe d/c home./Met    Problem: Impaired Psychosocial Skills/Behavior  PsychoSocial: Primary Team Goal: Pt will demonstrate coping skills for adjusting  to  post-cardiac surgery recovery as reflected in positive expectations for  continuing to increase levels of functional independence/Active    Problem: Impaired Self-care Mgmt/ADL/IADL  Self Care: Primary Team Goal: Pt will be Mod I in ADL and functional mobility  and adhere to sternal precautions 100% of the time with no cuing./Active    Problem: Impaired Skin/Wound Mgmt  Skin Wound: Primary Team Goal: Patient and caregiver will be independent of  wound care/Active    Add/Update Problems from this assessment:  No updates at this time.    Please review Integrated Patient View Care Plan Flowsheet for Team identified  Problems, Interventions, and Goals.    Signed by: Coralyn Pear, RN 03/24/2014 12:25:00 PM

## 2014-03-24 NOTE — Rehab Progress Note ([facility]) (Signed)
[name]  MRN: [id number]  Account: [id number]  Session Start: 03/24/2014 12:00:00 AM  Session Stop: 03/24/2014 12:00:00 AM    SEVERE SEPSIS SCREEN  INFECTION:  Patient has no indication of infection.  Negative Sepsis Screen.  If you are unable to assess a system's dysfunction because you do not have labs,  or the labs you have are not current (within 24 hours), call physician and  request and order for the lab tests needed.    Signed by: Coralyn Pear, RN 03/24/2014 8:30:00 AM

## 2014-03-24 NOTE — Rehab Progress Note ([facility]) (Signed)
NAMEYARIS FERRELL  MRN: [id number]  Account: [id number]  Session Start: 03/24/2014 12:00:00 AM  Session Stop: 03/24/2014 12:00:00 AM    SEVERE SEPSIS SCREEN  INFECTION:  Patient has no indication of infection.  Negative Sepsis Screen.  If you are unable to assess a system's dysfunction because you do not have labs,  or the labs you have are not current (within 24 hours), call physician and  request and order for the lab tests needed.    Signed by: Haruye Lainez, RN 03/24/2014 8:15:00 PM

## 2014-03-25 LAB — GLUCOSE WHOLE BLOOD - POCT
Whole Blood Glucose POCT: 93 mg/dL (ref 70–100)
Whole Blood Glucose POCT: 179 mg/dL — ABNORMAL HIGH (ref 70–100)

## 2014-03-25 MED ORDER — FUROSEMIDE 20 MG PO TABS
20.0000 mg | ORAL_TABLET | Freq: Every day | ORAL | Status: DC
Start: 2014-03-25 — End: 2014-05-15

## 2014-03-25 MED ORDER — CARVEDILOL 25 MG PO TABS
25.0000 mg | ORAL_TABLET | Freq: Two times a day (BID) | ORAL | Status: DC
Start: 2014-03-25 — End: 2014-04-23

## 2014-03-25 MED ORDER — TRAMADOL HCL 50 MG PO TABS
50.0000 mg | ORAL_TABLET | Freq: Four times a day (QID) | ORAL | Status: DC | PRN
Start: 2014-03-25 — End: 2014-04-23

## 2014-03-25 MED ORDER — AMIODARONE HCL 200 MG PO TABS
200.0000 mg | ORAL_TABLET | Freq: Every day | ORAL | Status: AC
Start: 2014-03-25 — End: ?

## 2014-03-25 MED ORDER — CALCITRIOL 0.25 MCG PO CAPS
0.2500 ug | ORAL_CAPSULE | Freq: Every day | ORAL | Status: AC
Start: 2014-03-25 — End: ?

## 2014-03-25 MED ORDER — GABAPENTIN 300 MG PO CAPS
300.0000 mg | ORAL_CAPSULE | Freq: Two times a day (BID) | ORAL | Status: AC
Start: 2014-03-25 — End: ?

## 2014-03-25 MED ORDER — HYDRALAZINE HCL 100 MG PO TABS
100.0000 mg | ORAL_TABLET | Freq: Three times a day (TID) | ORAL | Status: DC
Start: 2014-03-25 — End: 2014-05-15

## 2014-03-25 MED ORDER — AMLODIPINE BESYLATE 10 MG PO TABS
10.0000 mg | ORAL_TABLET | Freq: Every day | ORAL | Status: DC
Start: 2014-03-25 — End: 2014-05-15

## 2014-03-25 MED ORDER — COLLAGENASE 250 UNIT/GM EX OINT
TOPICAL_OINTMENT | Freq: Every day | CUTANEOUS | Status: DC
Start: 2014-03-25 — End: 2014-04-20

## 2014-03-25 MED ORDER — INSULIN ASPART 100 UNIT/ML SC SOLN
1.0000 [IU] | Freq: Three times a day (TID) | SUBCUTANEOUS | Status: AC | PRN
Start: 2014-03-25 — End: ?

## 2014-03-25 MED ORDER — ATORVASTATIN CALCIUM 10 MG PO TABS
10.0000 mg | ORAL_TABLET | Freq: Every evening | ORAL | Status: DC
Start: 2014-03-25 — End: 2014-05-15

## 2014-03-25 MED ORDER — OXYCODONE-ACETAMINOPHEN 5-325 MG PO TABS
1.0000 | ORAL_TABLET | ORAL | Status: DC | PRN
Start: 2014-03-25 — End: 2014-04-23

## 2014-03-25 MED ORDER — LIDOCAINE 5 % EX PTCH
2.0000 | MEDICATED_PATCH | Freq: Every day | CUTANEOUS | Status: DC | PRN
Start: 2014-03-25 — End: 2014-04-23

## 2014-03-25 MED ORDER — VENLAFAXINE HCL 75 MG PO TABS
75.0000 mg | ORAL_TABLET | Freq: Two times a day (BID) | ORAL | Status: DC
Start: 2014-03-25 — End: 2014-05-15

## 2014-03-25 MED ORDER — ASPIRIN 325 MG PO TBEC
325.0000 mg | DELAYED_RELEASE_TABLET | Freq: Every day | ORAL | Status: DC
Start: 2014-03-25 — End: 2014-04-23

## 2014-03-25 NOTE — Rehab Discharge Summary ([facility]) (Signed)
[name]  MRN: [id number]  Account: [id number]  Session Start: 03/24/2014 12:00:00 AM  Session Stop: 03/24/2014 12:00:00 AM    Rehabilitation Nursing  Inpatient Rehabilitation Discharge Summary    Rehab Diagnosis: Cardiac debility  Demographics:            Age: 64Y            Gender: Female  Primary Language: English    Date of Onset:  02/26/14  Date of Admission: 03/14/2014 6:35:00 PM    Rehabilitation Precautions Restrictions:   Falls, Infection, sternal    Discharge:  Patient discharged to:   Home  At discharge, the patient was discharged to live (with):  Family / Relatives.  Follow up providers include: Family.    VITAL SIGNS  Blood Pressure: 157/68 mmHg  Temperature:  degrees  Pulse: 79 beats per minute  Respirations: 18 breaths per minute  Pain: 7/10    WEIGHT and NUTRITION  Admission Weight: 156 pounds; Current Weight: 156pounds  Weight Change since Admit: Patient has had no weight change since admission.  Food Consistency: Regular  Liquid Consistency:Thin    Patient Report: " Im fine".  Pain: Patient currently has pain.  Location: sternal area  Type: Acute  Quality: Aching.  Pain Scale: Numeric.  Patient reports a pain level of 7 out of 10.  Patient's acceptable level of pain 0 out of 10.   Interferes with sleep.  Pain is alleviated by: pain medication.  Pain is exacerbated by: movements. Patient medicated.  Pain Reassessment: Pain was not reassessed as no pain was reported.  Patient/Caregiver Goals:  To go home and live normally    SEVERE SEPSIS SCREEN  INFECTION:  Patient has no indication of infection.  Negative Sepsis Screen.  If you are unable to assess a system's dysfunction because you do not have labs,  or the labs you have are not current (within 24 hours), call physician and  request and order for the lab tests needed.    NEURO  Orientation/Awareness: Alert and Oriented x4.  Speech: No deficits noted at this time.  Behavior: Cooperative.    MEDICATIONS  IV Access: Patient has IV access.      Type: Saline Lock  IV Gauge: 22  Location: RFA  Care Provided: Flushed with 10ml normal saline.   Date Inserted:   03/21/14  Dialysis Access: Patient does not have dialysis access.    Elopement Risk Level Assessment Tool  Patient Criteria: Patient is not capable of leaving the unit.  Assessment is not  applicable.    RISK ASSESSMENT FOR FALLS/INJURY    MENTAL STATUS CRITERIA:   0 - None identified.  MENTAL STATUS TOTAL: 0    AGE CRITERIA:   64 - < 8 years old  AGE TOTAL: 0    ELIMINATION CRITERIA:   3 - Toileting with Assistance.   ELIMINATION TOTAL: 3    HISTORY OF FALLS CRITERIA:   2 - Unknown History.  HISTORY OF FALLS TOTAL: 2    MEDICATIONS CRITERIA:   1 High Risk Medication (*see list below)   MEDICATIONS TOTAL: 1    PHYSICAL MOBILITY CRITERIA:   3 - Decreased balance reaction.   PHYSICAL MOBILITY TOTAL: 3    FALLS RISK ASSESSMENT TOTAL: 9    Patient's Fall Risk: No Risk Level found for this score.    Falls Interventions: Clutter removed and clear path to BR.  Call bell, phone, glasses, etc within reach.  Hourly toileting/safety checks between 6am and 10pm,  then every 2 hours.    NUTRITION  Diet:  Type: Consistent carbohydrate.  Food Consistency: Regular.  Liquid Consistency: Thin.    CARDIOVASCULAR     Left Lower Extremity  Nail Bed Color: Pink.   +1 edema.   Right Lower Extremity  Nail Bed Color: Pink.   No edema or redness present.  Pulses:   Right Brachial Pulse: Regular. Strong. Rate is 78 .   Patient does not have a pacemaker.   Patient does not have a defibrillator.    CARDIOPULMONARY  Lung Sounds:   Upper lobes. Clear.   Lower lobes. Clear.  Type of Respirations: Regular.  Cough: No cough noted.  Respiratory Support: The patient does not require any respiratory support.  Respiratory Equipment: None. 94%    INTEGUMENTARY  Skin:  Temperature: Warm  Turgor: Normal for age  Moisture: Dry  Color of skin: Normal for Race/Ethnicity  Capillary Refill: Less than 3 seconds  Wounds/Incisions:     Wound/incision not  assessed this shift.   RLE Dressing done by day shift C/D/I.  Braden Scale for Predicting Pressure Sore Risk: Sensory Perception: No  impairment  Moisture: Rarely moist  Activity: Chairfast  Mobility: Slightly limited  Nutrition: Adequate  Friction and Shear: Potential problem  Braden Score: 18  Level of Risk: No risk (19-23). Will reassess every shift.    GASTROINTESTINAL  Abdomen: Soft. Nontender.  Bowel Sounds:  Active bowel sounds audible in all four quadrants.  Date of Last Bowel Movement:  03/21/14   No Problems/Complaints with Bowel Elimination Assessed.    GENITOURINARY  Current Bladder Pattern: Continent  Color:  Yellow   Patient denies problems with urination and/or catheter.    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 Patient is completely independent for eating.  There  are no activity limitations.    TOILETING: Toileting Score = 5.  Patient is supervision/set-up for toileting,  requiring: Stand by assistance.  Patient requires the following assistive device(s):  Grab bar.    BLADDER MANAGEMENT - LEVEL OF ASSIST: Bladder Score = 7. Patient is completely  independent for bladder management. There are no activity limitations.    BLADDER ACCIDENTS THIS SHIFT:  0 . Patient has not had an accident this  assessment and did not require medications or devices.    BOWEL MANAGEMENT - LEVEL OF ASSIST: Bowel Score = 6.  Patient is modified  independent for bowel management.  Patient did not have bowel movement.  Medication/intervention was provided.    BOWEL ACCIDENTS THIS SHIFT: 0 . Patient has not had an accident, but used a  stool softener.    TRANSFERS BED/CHAIR/WHEELCHAIR: Bed/chair/wheelchair Transfer Score = 5.  Patient is supervision/set-up for transferring to and from the  bed/chair/wheelchair, requiring: Stand by assistance.  Patient requires the following assistive device(s): Elevated head of  bed.  Elevated bed/surface.  Seating system of wheelchair.    TRANSFER TOILET: Toilet Transfer Score = 5.  Patient is supervision/set-up for  transferring to and from the toilet/commode, requiring: Stand by assistance.  Set up (positioning equipment, lock brakes and/or adjust foot rests).  Patient requires the following assistive device(s):  Grab bars.    Education Provided:    Education Provided: Pain management. Medication options. Side effects. Clinical  indicators of pain. Safety issues and interventions. Fall protocol. Supervision  requirements. Skin/wound care. Signs/symptoms of infection. Role of nutrition  in  wound prevention/healing. Incision care.       Audience: Patient.       Mode: Explanation.  Demonstration.       Response: Verbalized understanding.    ASSESSMENT  Long Term Goals (status prior to discharge): 1. Patient will be able to  demonstrate skill in wound care, dressing changes and skin monitoring with out  queing. - Goal Not Met   2. Patient will perform wound dressing changes independently. - Goal Not Met   3. Patient will demonstrate safe transfers from bed to wheelchair, bed to chair  and and bed to bsc using sternal precautions.   4. Patient will communicate her understandfing of s/s of  wound infection and  when to call the doctor.   5. Patient will demonstrate her understanding of DM and how to monitor BS, adm  insulin, and s/s of hypo and hyperglycemia. - Goal Met  Jan 11th (2 weeks)  Short Term Goals (status prior to discharge): 1. Patient will be able to call  100 percent of the time for help when getting out of bed. She will communicate  her understanding for assistance. - Goal Met   2. Patient will be able to, at the end of the week demonstrate good hand  washing techniques, and wound care for lower right extremity with reach bar if  needed. - Goal Not Met  January 4th, one week    Progress Towards Goals (final status): SHORT TERM GOAL REVIEW:       1. Patient will be able to call  100 percent of the time for help when  getting out of bed. She will communicate her understanding for assistance. - Met         2. Patient will be able to, at the end of the week demonstrate good hand  washing techniques, and wound care for lower right extremity with reach bar if  needed. - Met   LONG TERM GOAL REVIEW:       1. Patient will be able to demonstrate skill in wound care, dressing  changes and skin monitoring with out queing. - Met Per pt. " I had been doing my  dressing changed at home".       2. Patient will perform wound dressing changes independently. - Not Met  Needs to demonstrate dressing changed.       3. Patient will demonstrate safe transfers from bed to wheelchair, bed to  chair and and bed to bsc using sternal precautions. - Met       4. Patient will communicate her understandfing of s/s of  wound infection  and when to call the doctor. - Met       5. Patient will demonstrate her understanding of DM and how to monitor BS,  adm insulin, and s/s of hypo and hyperglycemia. - Met    PLAN  Recommendations for Follow-Up Care:   Patient did not receive valuables because Patient had no valuables.  Bladder Program: continent.  Bowel Program:  Last Bowel Movement- 03/21/2014    Skin: Daily dressing changed on the Right foot.  Current Diet: Consistent carbohydrates diet.  Pain Management: Medication. Acetaminophen 650 mg every 4 hours if needed for  pain and Tramadol 50 mg every 4 hours if needed for pain. Daily dressing on the  Right foot and watch for sign and symptoms of infection.    Care Plan  Identified problems from team documentation:  Problem: Impaired Endocrine/Metabolic Function  Endocrine: Primary Team Goal:  Patient will demonstrate understanding of DM  management to include adm of insulin, s/s of hypo/ hyper glycemia./Active    Problem: Impaired Mobility  Mobility: Primary Team Goal: Patient will perform household functional mobility  mod I using LRAD in order to ensure safe d/c  home./Met    Problem: Impaired Psychosocial Skills/Behavior  PsychoSocial: Primary Team Goal: Pt will demonstrate coping skills for adjusting  to post-cardiac surgery recovery as reflected in positive expectations for  continuing to increase levels of functional independence/Active    Problem: Impaired Self-care Mgmt/ADL/IADL  Self Care: Primary Team Goal: Pt will be Mod I in ADL and functional mobility  and adhere to sternal precautions 100% of the time with no cuing./Not Met    Problem: Impaired Skin/Wound Mgmt  Skin Wound: Primary Team Goal: Patient and caregiver will be independent of  wound care/Active    Status update for discharge:      Please review Integrated Patient View Care Plan Flowsheet for Team identified  Problems, Interventions, and Goals.    Signed by: Richa Shor, RN 03/24/2014 11:55:00 PM

## 2014-03-25 NOTE — Rehab Progress Note ([facility]) (Signed)
[name]  MRN: [id number]  Account: [id number]  Session Start: 03/24/2014 10:00:00 AM  Session Stop: 03/24/2014 11:00:00 AM    Occupational Therapy  Inpatient Rehabilitation Progress Note - Brief    Rehab Diagnosis: Cardiac debility  Demographics:            Age: 34Y            Gender: Female  Rehabilitation Precautions/Restrictions:   DM, right foot ulcer, Falls, Infection, sternal    SUBJECTIVE  Patient Report: My sternum hurts.  Pain: Patient currently has pain.  Patient reports a pain level of 7 out of 10. Patient medicated.    OBJECTIVE  General Observation: Received in w/c in room.    Interventions:       Therapeutic Activities:  Pt seen for OT with focus on BLE therex.  Pt with  7/10 pain in sternum.  Nurse gave pt pain meds.  Worked on transfers and sit to  supine.  Pt on wedge and completed BLE therex in supine.  Supine to sit with  supervision and min cues.  Pt assisted back to bed at end of session.  Pain Reassessment: Pain was not reassessed as no pain was reported.    Education Provided:    Education Provided: Bed mobility.       Audience: Patient.       Mode: Explanation.  Demonstration.       Response: Needs practice.    ASSESSMENT  Pt limited by pain in sternum and difficulty wb on RLE.  Pt would benefit from  continued OT to maximize independence.    PLAN  Continued Occupational Therapy is recommended.  Recommended Frequency/Duration/Intensity: 60-120 minutes, 5-6x week for 7-10  days from initial eval on 03/15/14  Continued Activities Contributing Toward Care Plan: ADL/IADL retraining, AE  training, sternal precautions, TherEx, TherAct, d/c planning    3 Hour Rule Minutes: 60 minutes of OT treatment this session count towards  intensity and duration of therapy requirement. Patient was seen for the full  scheduled time of OT treatment this session.  Therapy Mode Minutes: Individual: 60 minutes.    Signed by: Ila Mcgill, OTR/L 03/24/2014 11:00:00 AM

## 2014-03-25 NOTE — Progress Notes (Signed)
[facility]    History:    -CKD III, baseline Cr 1.6-1.7 mg/dL  -AKI, hemodynamically mediated  -LE edema  -HTN with CKD   -Anemia with CKD  -3 vessels CAD; s/p cath and CABG   -PVD with right foot ulcer  -metabolic acidosis    Assessment/Plan:    GFR is stable with BP now well controlled on current meds. Will check labs intermittently.    Scheduled Meds:  Current Facility-Administered Medications   Medication Dose Route Frequency   . amiodarone  200 mg Oral Daily   . amLODIPine  10 mg Oral Daily   . aspirin EC  325 mg Oral Daily   . atorvastatin  10 mg Oral QHS   . calcitRIOL  0.25 mcg Oral Daily   . carvedilol  25 mg Oral Q12H [facility]   . collagenase   Topical Q24H   . docusate sodium  100 mg Oral Daily   . furosemide  20 mg Oral Daily   . gabapentin  300 mg Oral BID   . heparin (porcine)  5,000 Units Subcutaneous Q12H [facility]   . hydrALAZINE  100 mg Oral TID   . insulin aspart  3 Units Subcutaneous Once   . lidocaine  2 patch Transdermal Q24H   . venlafaxine  75 mg Oral BID       Continuous Infusions:     PRN Meds:acetaminophen, albuterol-ipratropium, bisacodyl, dextrose, diphenhydrAMINE, diphenhydrAMINE-zinc acetate, glucagon (rDNA), insulin aspart, magnesium hydroxide, oxyCODONE-acetaminophen, polyethylene glycol, traMADol    Subjective:    Responsive and comfortable  HEENT: no visual complaint, cough, hearing deficit  Lungs: no cough, SOB  CV: No chest pain, palpitations  Abd: No constipation, pain, N&V  Ext: no pain    Objective:  Vital signs in last 24 hours:  Temp:  [98.1 F (36.7 C)] 98.1 F (36.7 C)  Heart Rate:  [79-83] 83  Resp Rate:  [16-18] 16  BP: (147-157)/(59-68) 147/59 mmHg    Labs:    Recent Labs  Lab 03/24/14  0424 03/22/14  0520 03/19/14  0605   GLUCOSE 103* 118* 99   BUN 36* 35* 36*   CREATININE 2.0* 1.9* 2.1*   CALCIUM 8.9 9.0 8.9   SODIUM 138 139 143   POTASSIUM 5.1 4.9 5.0   CHLORIDE 107 108 109   CO2 24 22 25    ALBUMIN 2.8* 2.8* 2.8*    PHOSPHORUS 3.8 3.8 3.7       Recent Labs  Lab 03/24/14  0424 03/22/14  0520 03/19/14  0605   WBC 4.65 4.95 5.16   HEMOGLOBIN 8.9* 9.0* 8.9*   HEMATOCRIT 29.5* 30.2* 29.7*   MCV 88.6 88.6 88.9   MCH, POC 26.7* 26.4* 26.6*   MCHC 30.2* 29.8* 30.0*   RDW 21* 21* 22*   MPV 9.8 9.8 9.3*   PLATELETS 272 276 271       Weight:  Wt Readings from Last 4 Encounters:   03/14/14 70.852 kg (156 lb 3.2 oz)       Intake/Output:  No intake or output data in the 24 hours ending 03/25/14 0839    Vital Signs:  Patient Vitals for the past 24 hrs:   BP Temp Pulse Resp SpO2   03/25/14 0605 147/59 mmHg 98.1 F (36.7 C) 83 16 92 %   03/24/14 2017 157/68 mmHg 98.1 F (36.7 C) 79 18 94 %       Physical Exam:    General appearance and mental status -  in no distress  Eyes - pupils equal, sclera clear  Neck - supple  Chest - clear to auscultation, no wheezes, rales or rhonchi  Heart - regular rate and rhythm, no murmurs or rubs  Abdomen - soft, nontender, no masses or organomegaly  Extremities - no edema  Skin - no rashes    Royann Shivers, MD  [facility].  [phone]

## 2014-03-25 NOTE — Discharge Summary (Signed)
REHABILITATION MEDICINE   DISCHARGE SUMMARY AND DISCHARGE NOTE    Patient Identification  [name] is a 64 y.o. female.  DOB:  [birth year]  Admit Date:  03/14/2014  Discharge date:  03/25/2014  Attending Provider: [name], MD                                     Admission Diagnoses: Debility [R53.81]    Disposition:       To home with family    Patient Instructions:       Case Management  Inpatient Rehabilitation Discharge Instructions    Discharge Plan: Pt. will [place] home Fri. 03/25/14 via WC Zenaida Niece. PTS will pick up @ 1pm.  [phone]  [address]  [place], [place] [address]  [phone]    daughter [name] [phone]    Follow-up Appointment(s): Please follow up with the following.  PCP Dr. Modena Nunnery [phone]    Podiatrist Dr. John Giovanni [phone]    Vascular Sx. ++NEED ANGIOGRAM++ Dr. Garner Nash [phone]    Pulmonology/ Sleep Med. Dr. Candis Musa [phone]    Nephrology Dr. Doran Stabler [phone]    Cardiology Dr. Donah Driver [phone]    [facility] [phone]    Additional Information: Home Health RN, Wound Care, PT, OT will be provided by  [facility] [phone]. They will call 24-48 hours after [place] to arrange initial  Appointment.    For any additional questions or concerns please feel free to contact RN, CM  Jena Gauss @ [phone].    Pt. and daughter aware of above [place] plan.    No driving until cleared by a physician or occupational therapist.    Current Discharge Medication List      START taking these medications    Details   !! amLODIPine (NORVASC) 10 MG tablet Take 1 tablet (10 mg total) by mouth daily.  Qty: 30 tablet, Refills: 1      aspirin EC 325 MG EC tablet Take 1 tablet (325 mg total) by mouth daily.      !! atorvastatin (LIPITOR) 10 MG tablet Take 1 tablet (10 mg total) by mouth nightly.  Qty: 30 tablet, Refills: 1      calcitRIOL (ROCALTROL) 0.25 MCG capsule Take 1 capsule (0.25 mcg total) by mouth daily.  Qty: 30 capsule, Refills: 1      !! carvedilol  (COREG) 25 MG tablet Take 1 tablet (25 mg total) by mouth every 12 (twelve) hours.  Qty: 60 tablet, Refills: 1      collagenase (SANTYL) ointment Apply topically daily.  Qty: 1 g, Refills: 5    Associated Diagnoses: Arterial leg ulcer      !! furosemide (LASIX) 20 MG tablet Take 1 tablet (20 mg total) by mouth daily.  Qty: 30 tablet, Refills: 1      lidocaine (LIDODERM) 5 % Place 2 patches onto the skin daily as needed. Remove & Discard patch within 12 hours or as directed by MD  Qty: 60 patch, Refills: 1      !! oxyCODONE-acetaminophen (PERCOCET) 5-325 MG per tablet Take 1 tablet by mouth every 4 (four) hours as needed.  Qty: 90 tablet, Refills: 0      !! traMADol (ULTRAM) 50 MG tablet Take 1 tablet (50 mg total) by mouth every 6 (six) hours as needed.  Qty: 90 tablet, Refills: 1      !!  venlafaxine (EFFEXOR) 75 MG tablet Take 1 tablet (75 mg total) by mouth 2 (two) times daily.  Qty: 60 tablet, Refills: 1       !! - Potential duplicate medications found. Please discuss with provider.      CONTINUE these medications which have CHANGED    Details   amiodarone (PACERONE) 200 MG tablet Take 1 tablet (200 mg total) by mouth daily.  Qty: 30 tablet, Refills: 0      gabapentin (NEURONTIN) 300 MG capsule Take 1 capsule (300 mg total) by mouth 2 (two) times daily.  Qty: 60 capsule, Refills: 1      hydrALAZINE (APRESOLINE) 100 MG tablet Take 1 tablet (100 mg total) by mouth 3 (three) times daily.  Qty: 90 tablet, Refills: 1      insulin aspart (NOVOLOG) 100 UNIT/ML injection Inject 1-4 Units into the skin 3 (three) times daily before meals as needed for High Blood Sugar. #90 insulin syringes and insulin needles  Qty: 1 mL, Refills: 0         CONTINUE these medications which have NOT CHANGED    Details   acetaminophen (TYLENOL) 325 MG tablet Take 2 tablets (650 mg total) by mouth every 4 (four) hours as needed for Pain.      !! amLODIPine (NORVASC) 10 MG tablet Take 1 tablet (10 mg total) by mouth daily.      aspirin 325 MG  tablet Take 1 tablet (325 mg total) by mouth daily.      !! atorvastatin (LIPITOR) 10 MG tablet Take 10 mg by mouth nightly.      !! carvedilol (COREG) 25 MG tablet Take 1 tablet (25 mg total) by mouth every 12 (twelve) hours.      !! furosemide (LASIX) 20 MG tablet Take 20 mg by mouth daily.         !! oxyCODONE-acetaminophen (PERCOCET) 5-325 MG per tablet Take 1 tablet by mouth every 4 (four) hours as needed.  Qty: 100 tablet, Refills: 0      polyethylene glycol (MIRALAX) packet Take 17 g by mouth daily.      !! traMADol (ULTRAM) 50 MG tablet Take 1 tablet (50 mg total) by mouth every 4 (four) hours as needed.  Qty: 100 tablet, Refills: 0      !! venlafaxine (EFFEXOR) 75 MG tablet Take 75 mg by mouth 2 (two) times daily.       !! - Potential duplicate medications found. Please discuss with provider.      STOP taking these medications       albuterol-ipratropium (DUO-NEB) 2.5-0.5(3) mg/3 mL nebulizer Comments:   Reason for Stopping:         cadexomer iodine (IODOSORB) 0.9 % gel Comments:   Reason for Stopping:         fluticasone (FLOVENT HFA) 220 MCG/ACT inhaler Comments:   Reason for Stopping:         insulin lispro (HUMALOG) 100 UNIT/ML injection Comments:   Reason for Stopping:         sodium bicarbonate 650 MG tablet Comments:   Reason for Stopping:               Discharge Diagnoses:  And Hospital Course:       Ambulation and activities of daily living dysfunction due to cardiac debility    Status post MI    Status post coronary artery bypass graft with multivessel coronary artery disease  - making some gains with PT and OT.  - 03/23/14:  we had a family meeting with the pt, pt's family, PT, OT, CM, MD to discuss medical status and rehab status. Medical Questions were answered to their satisfaction.  - medically stable to discharge today March 25, 2014.  - Continue with current pain medications. Increased doses of Percocet give her hallucinations. She is dealing with her pain with 5 mg Percocet as needed.     -  I had a face-to-face discussion with the patient about the medical necessity for her to get and use a motorized scooter for community ambulation.   She will have sternotomy precautions at least the next 6-8 weeks.   She has severe pain from her right foot PAD ulcer that needs continued aggressive treatment.  When she gets the worsened pain, she will need to be very careful not to weight bear through her arms because of the sternotomy precautions.   Because of her sternotomy precautions and right foot ulcer (ischemic) with severe peripheral arterial disease, she is not a candidate to use a cane, walker, or manual wheelchair.    Blood loss anemia also with iron deficiency  - Appreciate nephrology efforts. On IV iron.    History of anemia of chronic disease  - Iron deficiency. Received IV iron.    Chronic kidney disease III  - Nephrology is following. Slightly reduced creatinine.  - Continue with Lasix    Hypertension  - Follow the trend. Appreciate nephrology.    Right foot chronic arterial ulcer  - Continue with local care.   - I spoke with the wound care nurse. Consult the wound healing Center.  - Eventual outpatient followup with vascular surgery. I discussed with the patient her history of acute kidney injury with IV contrast but the eventual need for scheduled angiogram.  - podiatry consulted. Continue with wound care inpatient and outpatient.  - Appreciate interventional radiology. Outpatient followup with interventional radiology.    Diabetes mellitus  - Fairly controlled blood glucose. Follow with current diet.     History of moderate aortic stenosis    History of LVEF 45%    Severe peripheral arterial disease    History of COPD    Discharge Functional Status:        PT:  TRANSFERS BED/CHAIR/WHEELCHAIR: Bed/chair/wheelchair Transfer Score = 6.  Patient is modified independent for transferring to and from the  bed/chair/wheelchair, requiring: Walker. Pt is mod I for bed mobility and  transfers using RW. She  performs car transfer with RW for set up.  LOCOMOTION WHEELCHAIR:  Wheelchair locomotion was observed using a manual wheelchair. Wheelchair  Distance Scale = 3. Distance traveled in wheelchair is greater than 150 feet.  Wheelchair Score = 5. Patient is supervision or set up only for propelling  wheelchair, requiring: Stand by assistance. Patient was able to propel a  distance of 300 feet in a wheelchair. pt uses LEs in order to remain compliant  with sternal precautions.  LOCOMOTION WALK:  Walk Distance Scale = 2. Distance walked is 50 -149 feet. Walk Score = 2.  Patient is able to walk at least 50 feet (household distance) but requires  assistance. Patient walked a distance of 118 feet. Rolling walker. Pt ambulates  over even surface with RW. Gait characterized by reduced step length at LLE,  hyperextension at right knee throughout gait cycle, reduced ankle DF at right  ankle.  LOCOMOTION STAIRS: Stairs Score = 1. Incidental assistance with lifting or  lowering, contact guard or steadying was provided. Patient requires/performs 75%  or  more of effort and minimal contact assistance with negotiating stairs.  Patient negotiated 1 stairs.  Patient requires the following assistive device(s): No assistive devices were  required. Pt requires hand held min assistance for negotiating 1 step necessary  to enter her home. Pt ascends with LLE and descends leading with RLE.  Today's Treatment Interventions:  Therapeutic Activities: Session focused on improving strength fo LEs and  reassessment of functional mobility as described above. Pt performed supine  therex, led by student physical therapist, including heels slides, bridging,  SLR, hip adduction, etc x10 reps bilaterally for each. Pt given ice pack for  pain relief at pectoral muscles.    OT:  EATING: Eating Score = 7. Patient is completely independent for eating. There  are no activity limitations.  GROOMING: Grooming Score = 7. Patient is completely independent for  grooming.  There are no activity limitations.  BATHING: Patient bathed in shower. Bathing Score = 6. Patient is modified  independent for bathing, requiring: Safety considerations. Grab bar/arm rest to  maintain balance. Hand held shower.  UPPER BODY DRESSING: Upper Body Dressing Score = 6 Patient is modified  independent for upper body dressing, requiring: Safety considerations.  LOWER BODY DRESSING: Lower Body Dressing Score = 6. Patient is modified  independent for lower body dressing, requiring: Safety considerations.  TOILETING: Toileting Score = 6. Patient is modified independent for toileting,  requiring: Adaptive device to maintain balance.  BLADDER MANAGEMENT - LEVEL OF ASSIST: Bladder Score = 7. Patient is completely  independent for bladder management. There are no activity limitations.  BLADDER ACCIDENTS THIS SHIFT: 0 . Patient has not had an accident this  assessment and did not require medications or devices.  TRANSFER TOILET: Toilet Transfer Score = 6. Patient is modified independent for  transferring to and from the toilet/commode, requiring: Safety considerations.  Walker.  TRANSFER SHOWER: Shower Transfer Score = 5. Patient is supervision/set-up for  transferring to and from the shower, requiring: Stand by assistance. Walker.  Shower chair.  COMPREHENSION: Auditory comprehension is the usual mode. Comprehension Score =  7, Independent. Patient comprehends complex/abstract information in their  primary language. Patient is completely independent for auditory comprehension.  There are no activity limitations.  EXPRESSION: Vocal expression is the usual mode. Expression Score = 7,  Independent. Patient expresses complex/abstract information in their primary  language. Patient is completely independent for vocal expression. There are no  activity limitations.  SOCIAL INTERACTION: Social Interaction Score = 7, Independent. Patient is  completely independent for social interaction. There are no  activity  limitations.  PROBLEM SOLVING: Patient does not make appropriate decisions in order to solve  complex problems without assistance from a helper. Problem Solving Score = 5,  Supervision. Patient makes appropriate decisions in order to solve routine  problems with directing only under stressful or unfamiliar conditions, but no  more than 10% of the time, for the following behavior(s): Recognizing risks  MEMORY: Memory Score = 7, Independent. Patient is completely independent for  memory. There are no activity limitations.  Today's Treatment Interventions:  Self Care/Home Management: OT treatment addressed safety and progression  to mod I during self-care routine. Pt gathered clothes and performed functional  mobility with mod I at wheelchair level due to foot pain. OT provided educations  concerning the importance of adherence to sternal precautions during ADL and  functional transfers, as well as home safety measures. Please see functional  measures above for performance. Engaged pt in repeated sit to stands from  progressively lower mat height with emphasis on sternal precautions and safe  technique.    History:        This 64 y.o. year old right-handed female With a past medical history significant for a right leg angioplasty with a chronic right foot ulcer, hypertension, insulin-dependent diabetes, COPD, stage III chronic kidney disease, anemia, peripheral arterial disease, remote tobacco abuse who was admitted initially to [facility] with syncope 02/26/2014. She ruled in for a non-ST elevated MI. Cardiac catheter revealed multivessel CAD. She was admitted to [facility] 02/28/2014. She underwent a coronary artery bypass graft proximal LAD and circumflex.    Hospital course was notable for: Nephrology consultation for her chronic kidney disease. Right iliac stenting was recommended but and is being deferred due to her current renal function (status post 2 recent insults).  Antihypertensives were titrated up.    The patient was noted to have anemia.   Patient was deemed medically stable to be discharged to acute inpatient rehabilitation.    Functional history and social history:    Prior to admission, the patient was modified Independent using a rolling walker household distances. Modified independent with her ADLs. The patient lives with her daughter who works at night.     The patient lives in a one-story home with one step to enter.    Past Medical History:    Past Medical History    Past Medical History    Diagnosis  Date    .  Diabetes mellitus     .  Chronic kidney disease     .  Myocardial infarction     .  Hypertension     .  Hyperlipidemia     .  Peripheral arterial disease       06/28/13 right superficial femoral artery stenosis, tibial peroneal trunk stenosis, left CVL a stenosis, treated with angiography and angioplasty.    .  Coronary artery disease       Left circumflex artery presumed DES., Chronically occluded right coronary artery stent    .  Anemia       She redid to chronic disease and iron deficiency.     .  Arterial leg ulcer       2014, receiving home health    .  Chronic obstructive pulmonary disease       Previously on Spiriva and Advair         Allergies:   Allergies    Allergen  Reactions    .  Penicillins  Anaphylaxis and Angioedema      Tolerates cephalosporins       Family History:    Family History      Discharge Exam:  Filed Vitals:    03/24/14 0539 03/24/14 0838 03/24/14 2017 03/25/14 0605   BP: 147/69 158/63 157/68 147/59   Pulse: 74 78 79 83   Temp: 97.9 F (36.6 C)  98.1 F (36.7 C) 98.1 F (36.7 C)   TempSrc:       Resp: 18 18 18 16    SpO2: 94% 96% 94% 92%       Patient is getting ready for discharge.  No new issues.    Cardiac:  Regular rhythm  Lungs:  Clear to auscultation  Abdomen: soft, with bowel sounds, nontender, non-distended  Extremities:  No calf tenderness.      No results for input(s): GLUCOSEWHOLE in the last 24 hours.      Recent  Labs  Lab 03/24/14  0424 03/22/14  5621 03/19/14  0605   WBC 4.65 4.95 5.16   HEMOGLOBIN 8.9* 9.0* 8.9*   HEMATOCRIT 29.5* 30.2* 29.7*   PLATELETS 272 276 271          Recent Labs  Lab 03/24/14  0424 03/22/14  0520 03/19/14  0605   SODIUM 138 139 143   POTASSIUM 5.1 4.9 5.0   CHLORIDE 107 108 109   CO2 24 22 25    BUN 36* 35* 36*   CREATININE 2.0* 1.9* 2.1*   CALCIUM 8.9 9.0 8.9   ALBUMIN 2.8* 2.8* 2.8*   GLUCOSE 103* 118* 99   PHOSPHORUS 3.8 3.8 3.7       No results for input(s): INR, APTT in the last 168 hours.    Results     Procedure Component Value Units Date/Time    Glucose Whole Blood - POCT [[id number]] Collected:  03/25/14 9629     POCT - Glucose Whole blood 93 mg/dL Updated:  52/84/13 2440    Glucose Whole Blood - POCT [[id number]]  (Abnormal) Collected:  03/24/14 2054     POCT - Glucose Whole blood 113 (H) mg/dL Updated:  44/03/47 4259    Glucose Whole Blood - POCT [[id number]]  (Abnormal) Collected:  03/24/14 1637     POCT - Glucose Whole blood 121 (H) mg/dL Updated:  32/95/18 8416    Glucose Whole Blood - POCT [[id number]]  (Abnormal) Collected:  03/24/14 1118     POCT - Glucose Whole blood 153 (H) mg/dL Updated:  09/32/35 5732          Radiology: all results from this admission  Xr Chest 2 Views    03/10/2014   Impression: Tiny left apical pneumothorax. Left message for nurse at 5:00 PM.  Clide Cliff, MD  03/10/2014 5:08 PM     Xr Chest 2 Views    03/03/2014    1. Clear lungs. 2. Normal heart size.  Elizebeth Koller, MD  03/03/2014 4:08 PM     Xr Tibia Fibula Right Ap And Lateral    03/01/2014    1. No findings of osteomyelitis involving the shafts of the tibia or fibula. 2. 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 Further evaluation of these regions should be guided by clinical parameters.  Annabell Sabal, MD  03/01/2014 11:12 AM     X-ray Chest Ap Portable (daily)    03/06/2014    Small effusions and  bibasilar atelectasis.  Bosie Helper, MD  03/06/2014 8:16 AM     X-ray Chest Ap Portable (daily)    03/05/2014    Mild subsegmental atelectasis in the left lung base and borderline pulmonary vascular prominence.  Lanier Ensign, MD  03/05/2014 4:41 AM     X-ray Chest Ap Portable (once)    03/04/2014    Interval postoperative changes. Smaller cardiac silhouette. Mild congestion.  Prince Solian, MD  03/04/2014 7:31 PM     Xr Chest Ap Portable    02/28/2014    No acute disease.  Kennyth Lose, MD  02/28/2014 9:38 PM     US Carotid Duplex Doppler Complete    03/01/2014    Diffuse intimal thickening, soft plaque and calcific plaque in the extracranial carotid arteries with less than 50% luminal narrowing.  Geanie Cooley, MD  03/01/2014 4:04 PM     US Arterial / Graft Duplex Doppler Lower Extremity Bilateral Complete  03/03/2014     1. Right lower extremity: Normal ankle-brachial index 1.0 and toe brachial index 0.61. Patent right superficial femoral artery stent. Suspected 50-75% stenosis right external iliac and superficial femoral artery just distal to the stent. 2. Single peroneal runoff on the right. 3. Left lower extremity: Reduced ankle-brachial index 0.52 and toe brachial index 0.37.  Diffuse atherosclerotic disease is noted with multiple stenoses diffusely within the superficial femoral artery and significant stenosis in the tibioperoneal trunk.  4. 2 vessel runoff with patent left anterior tibial and peroneal but with 50-75% stenosis mid peroneal.  Carmina Miller, MD  03/03/2014 4:19 PM     US Venous Low Extrem Duplx Dopp Comp Bilat    03/14/2014    No evidence of acute DVT.    Gustavus Messing, MD  03/14/2014 10:10 PM     [place] Noninvas Low Extrem Art Dopp/press/wavefrms (pvr) Comp 3-4 Levels    03/04/2014     1. Right lower extremity: Normal ankle-brachial index 1.0 and toe brachial index 0.61. Patent right superficial femoral artery stent. Suspected 50-75% stenosis right external iliac and superficial  femoral artery just distal to the stent. 2. Single peroneal runoff on the right. 3. Left lower extremity: Reduced ankle-brachial index 0.52 and toe brachial index 0.37.  Diffuse atherosclerotic disease is noted with multiple stenoses diffusely within the superficial femoral artery and significant stenosis in the tibioperoneal trunk.  4. 2 vessel runoff with patent left anterior tibial and peroneal but with 50-75% stenosis mid peroneal.  Carmina Miller, MD  03/03/2014 4:19 PM     US Venous Duplex Doppler Leg Bilateral Limited    03/01/2014      1. Right and left great saphenous veins widely patent throughout as detailed above. 2. Small saphenous veins are widely patent bilaterally as detailed above.  3. Note that there is mild wall thickening in both small saphenous veins.  Gwenyth Allegra, MD  03/01/2014 4:07 PM       Discharge planning, preparation, and coordination took more than 35 minutes.    Xachary Hambly Virl Son, MD  [facility].

## 2014-03-25 NOTE — Rehab Discharge Instruction ([employer]il[employer]) (Signed)
[name]  MRN: [id number]  Account: [id number]  Session Start: 03/25/2014 12:00:00 AM  Session Stop: 03/25/2014 12:00:00 AM    Rehabilitation Nursing  Inpatient Rehabilitation Discharge Instructions    Discharge Date: 03/25/2013    Transportation: Patient's Mode of Transportation: Wheelchair  Accompanied By:  arranged transportation    Recommendations for Follow-Up Care:   Yes, patient received valuables.  Bladder Program: continent  Bowel Program:  Last Bowel Movement- 03/24/2014   continent  Skin: right foot ulcer wound dressing changed this shift follow up home care RN  . Pt teaching done , Supply dressing items good for 3 days. Pt will also follow  up with local wound care agency. Wound culture is still pending.  Current Diet: CHO  Pain Management: Medication.    VITAL SIGNS  Blood Pressure:147/69  mmHg  Temperature: 98.1  degrees  Pulse: 83  beats per minute  Respirations: 16  breaths per minute  Pain: 0/10    [place] Medications: Refer to separate medications list provided.        Please follow the home exercise programs provided by your therapists in addition  to the following recommendations.  Stop activity if shortness of breath or chest  pain occurs.    Call your physician if you experience excessive pain, fever, or other concerns.    PRECAUTIONS    A.  Don't move patient using affected arm or leg.  B.  Keep emergency numbers by the phone.  [place] brakes if using a wheelchair.  Move wheelchair foot rest prior to  standing.  D.  Remove obstacles from the home (throw rugs).  E.  Follow prescribed instructions.  F.  If you fall, call 911 for assistance.  G.  If you are on home oxygen then ensure that smoke detectors are present and  properly functioning. No smoking when oxygen is in use. Wear only non-flammable  clothing when oxygen is in use.    When standing from a bed or chair, push off from bed/chair. Do not pull on  walker or helper. If using a wheelchair, always lock the brakes and remove leg  rests before  getting in/out of wheelchair.    No driving until cleared by your primary care physician.      MEDICATION USE GUIDELINES    1. Become familiar with the names of your medication(s) and their appearance.  2. Know why you are taking each medication, what undesirable effects you may  expect, and when to contact your physician.  3. Consult with a pharmacist and /or your physician before using any over the  counter (OTC) medications.  4. Be compliant with your medication schedule.  If you feel you cannot, then  discuss alternatives with your physician.  5. If you are taking Enteric coated or extended/delayed release product(s), then  never crush, chew, open, cut, break or destroy by any means because serious  effects can occur.  6. When storing your medications, avoid hot and humid places (i.e., kitchen,  bathrooms).  7. Select a safe place out of children's reach to store medications.  Follow any  other special instructions.  8. Inform your doctors of all the medications you are taking, including those  you buy without a prescription.  9. Use the same pharmacy to buy your prescriptions whenever possible.    Signed by: Ginger Carne, RN 03/25/2014 8:30:00 AM

## 2014-03-25 NOTE — Rehab Evaluation ([facility]) (Signed)
[name]  MRN: [id number]  Account: [id number]  Session Start: 03/24/2014 1:00:00 PM  Session Stop: 03/24/2014 2:00:00 PM    Physical Therapy  Inpatient Seating and Mobility Evaluation    Rehab Diagnosis: Cardiac debility secondary to NSTEMI and multivessel CAD s/p  CABG  Demographics:            Age: 64Y            Gender: Female  Primary Language: English    Referring Clinician: Dr. [name]  Medical Equipment Provider: Marijean Heath, [facility]  [phone]  [email or web address]  Past Medical History: Diabetes mellitus  Chronic kidney disease  Myocardial infarction  Hypertension  Hyperlipidemia  Peripheral arterial disease 06/28/13 right superficial femoral artery stenosis,  tibial peroneal trunk stenosis, left CVL a stenosis, angioplasty  Coronary artery disease  Left circumflex artery presumed DES, Chronically occluded right coronary artery  stent  Anemia  Arterial leg ulcer 2014, receiving home health  Chronic obstructive pulmonary disease    PAST SURGICAL HISTORY  Cardiac angiography and angioplasty   06/2013  Colonoscopy  Tubal ligation  History of Present Illness: 64 year old female with PMH DM2, PAD, HTN, CKD,  COPD, CAD s/p RCA stent initially admitted [facility] with syncope  02/26/14. Found to have NSTEMI and cardiac cath revealed multivessel CAD.  Transferred [facility] 02/28/14 and subsequently underwent CABG x2 on 12/18. Patient  also with severe PAD with chronic RLE arterial ulcer, s/p angioplasty in 06/2013;  clinically improving  but has not had wound care recently. Prior to hosp  admission patient had not been taking her Spiriva or Symbicort for COPD; resumed  in hospital. She is also being treated for asymptomatic bacteriuria and iron  deficiency anemia. She is DM2 and her Hgb A1C 7.1.   Date of Onset: 02/26/14   Date of Admission: 03/14/2014 6:35:00 PM  Premorbid Functional Level: Modified independent for household distances with  RW/cane, independent/modified  independent for all ADLs  Social/Education History: Lives with daughter, [name], who works at night.  Other daughter lives in the area and can also help. Retired.  Home Environment: Patient lives with daughter [name] in a 1 story home with 1  STE, will return there after rehab.  History of Skin Breakdown: Patient has a chronic arterial ulcer starting in 2014  on right foot which starts on dorsum of foot and descends down along medial  arch.  Equipment Owned: Straight cane. Rolling walker.    Medications and Allergies: Significant rehabilitation considerations:   Please refer to EPIC  Rehabilitation Precautions/Restrictions:   Falls, Infection, sternal    SUBJECTIVE  Current Functional Limitations: The patient/caregiver reports the following  functional limitations: Patient states she currently has difficulty ambulating  longer distances due to increased fatigue from cardiac complications and  presence of arterial ulcer on right foot. She also gets fatigued easily with  usage of manual wheelchair and has difficulty propelling effectively due to  presence of sternal precautions. She must rest frequently during physical  activities.  Patient Report: "It's very hard for me to walk with this ulcer on my foot, and  now I get so tired"  Patient/Caregiver Goals: Patient's functional goals: "To go home and live  normally"  Pain: Patient currently has pain.  Location: Chest, right foot  Type: Acute  Quality: Aching.  Pain Scale: Numeric.  Patient reports a pain level of 6 out of 10.  Patient's acceptable level of pain 2 out of 10.  Interferes with physical activity.  Pain is alleviated by: Rest, propping foot up  Pain is exacerbated by: Weight bearing, ambulation Patient premedicated;  lidocaine patches on B sides of chest for incisional pain    OBJECTIVE  General Observation: Pt seated in w/c; sternal incision well-healing open to  air; R foot with significant edema covered by dressing and sock.  Cognition: Within  functional limits.  Vision: Within functional limits.  Hearing: Normal.  Skin Integrity: Skin is intact where visualized. Patient's right foot arterial  ulcer is wrapped and covered with a sock, so unable to visualize at this time.  Range of Motion: Passive range of motion is within functional limits except in  the following areas: Decreased PROM into right dorsiflexion limited by swelling  and pain Active range of motion is within functional limits except in the  following areas: Decreased AROM into right dorsiflexion especially during  ambulation, causing hyperextension at right knee during stance phase of  ambulation  Strength:  Within Functional Limits throughout.    Tone/Spasticity:  No relevant impairments.  Dominant Hand: Right.  Coordination: Not tested.    Sensation: Right Lower Extremity: Light touch impaired.   Left Lower extremity: Light touch impaired.  Bilateral peripheral neuropathy extending from feet to below knees.    Sitting Balance: Maintains balance without UE support.    Functional Mobility:  Bed Mobility: Within functional limits.  Transfers: Patient transfers Sit to/from Stand with modified independence.   Patient transfers Bed to/from Chair with modified independence. Using rolling  walker  Pressure Relief: Modified Independence. Positional changes. Weight shifts.  Locomotion/Gait: Patient was supervision with gait/ambulation for Patient  ambulates a maximum of 120' with RW requiring supervision due to fatigue. .  Patient requires the following assistive device(s): Rolling walker. Right  decreased heel strike. Right decreased step length. Left decreased step length.  Right knee recurvatum. Decreased weight shift to the Right.    Current Wheelchair Mobility/Management:  Propulsion: Independent. Observed by therapist.  Ramps: Assisted. Reported by patient and family.  Curbs: Not applicable.  Wheelies: Not applicable.  Arm Rests: Independent. Reported by patient and family.  Foot Rests:  Assisted. Reported by patient and family.    Sitting Posture on Mat:  Pelvis:  Obliquity:  Not present.  Rotation:   Not present.  Tilt:   Not present.  Lumbar/Thoracic Spine:  Scoliosis: Not present.  Kyphosis: Not present.  Lordosis: Within normal limits.  Rib Cage:  Obliquity: None/even.  Shoulder Complex:  Shoulder Complex Level: Level symmetrically.  Glenohumeral Subluxation: None.  Cervical Spine:  Alignment: Within normal limits.    Patient Height:  Height:   5' 1 1/2"  Patient Weight:  Weight:   159    Anatomical Measurements:    Hip width: 17 inches.  Left seat depth: 19 inches.  Right seat depth: 19 inches.  Below left knee to heel: 17 inches.  Below right knee to heel: 17 inches.  Seat to top of left shoulder: 21 inches.  Seat to top of right shoulder: 21 inches.  Seat to top of head: 30 inches.  Shoulder width: 18 inches.    Interventions:       Wheelchair Management:  Patient was evaluated for motorized scooter by  physical therapist and ATP. Extensive discussion took place with pt, vendor, and  therapist regarding appropriateness of power scooter use, as pt is unable to  utilize rolling walker option or manual wheelchair option as feasible solutions  for traversing long distances due to fatigue  and sternal precautions. Patient is  aware of pros and cons of all of  the above, and pt successfully demonstrated ability to transfer in and out of  scooter, operate all pertinent controls, and maneuver around hallways, doorways,  and small spaces safely. Vendor demonstrated disassembly and reassembly of the  scooter, and the patient verbally confirmed understanding of this process.    ASSESSMENT  Summary of Findings: Pt is a 64 year old female who has both cardiac history and  peripheral neuropathy that significantly compromises LE proprioception, balance,  and activity tolerance. While she currently can ambulate with a RW for household  distances up to 120 feet, anything beyond that results in significant  increase  in LE fatigue and instability, thus requiring the pt to take several rest breaks  when ambulating. She also has a right foot arterial ulcer and due to her  diabetes and peripheral neuropathy, is at continued risk for further development  of skin issues. A manual wheelchair would also incur increased time and energy,  and she would potentially require assistance to use it in the event she did get  fatigued and could not effectively propel. She also currently has sternal  precautions and cannot propel the wheelchair effectively while maintaining these  precautions. The most appropriate option for her at this time that offers  independence, safety, optimal energy conservation, efficiency, and maximal  quality of life is the use of a power scooter for maneuvering within the home  between kitchen, bedroom, and bathroom, and outside the home for transport to  doctor's appointments.  No other piece of equipment is going to   offer her all of the aforementioned benefits.  Equipment Needed: Motorized scooter .    Pride GoGo Three Wheeled Scooter:  Ms.Stodghill requires a power scooter in order to regain her independence in  mobility and mobility-related activities of daily living in the home and  community. The Pride GoGo 3-wheeled scooter is the most appropriate form of  power mobility for Ms. Maslow at this time. It is easily broken down and light  enough for her family to safely and feasibly lift its parts in and out of the  car when needed for transportation reasons, and has good maneuverability via its  3-wheel configuration for use in her living environment in particular. She  already demonstrated excellent ability to utilize demo scooter in the  clinic.Through the use of this scooter, Ms. Bare will significantly lessen her  fatigue and energy consumption mobilizing in her home, and will be able to  increase her independence for performing ADLs and other activities due to the  ability to conserve energy instead  of expending it via ambulation or manual  wheelchair propulsion.    Strengths: Patient is motivated to keep herself mobile and safe, and her family  is also supportive of this goal.  Rehab Potential: Good family/social support, Motivated  Barriers to Progress/Discharge: No potential barriers to progress.    Risks/Benefits of Rehabilitation Discussed with Patient/Caregiver: Yes.  Recommendations/Goals for Rehabilitation Discussed with Patient/Caregiver: Yes.    PLAN  Physical Therapy Plan: Continued Physical Therapy is recommended.  Recommended Frequency/Duration/Intensity: 1-2 hours per day, 5-6 days per week  for 2 weeks after initial eval on 03/15/14  Activities Contributing Toward Care Plan: bed mobility, transfer training,  aerobic and endurance training, balance activity, strengthening, procurement of  DME, caregiver training, pt education and training on falls prevention, etc.    3 Hour Rule Minutes: 60 minutes of PT treatment this  session count towards  intensity and duration of therapy requirement. Patient was seen for the full  scheduled time of PT treatment this session.  Therapy Mode Minutes: Individual: 60 minutes.      Physician Certification: This is to certify that the above named patient, who is  under my care, requires the previously stated seating and mobility equipment. I  further certify that the equipment and services outlined in this plan are  skilled and medically necessary.    Physician signature: __________________ MD,  Date of certification:  ____/____/____    Signed by: Malachy Mood, PT 03/24/2014 2:00:00 PM

## 2014-03-25 NOTE — Rehab Progress Note ([facility]ilinks) (Signed)
[name]  MRN: [id number]  Account: [id number]  Session Start: 03/25/2014 9:00:00 AM  Session Stop: 03/25/2014 10:00:00 AM    Physical Therapy  Inpatient Rehabilitation Progress Note - Brief    Rehab Diagnosis: Cardiac debility  Demographics:            Age: 46Y            Gender: Female  Rehabilitation Precautions/Restrictions:   Falls, Infection, sternal    SUBJECTIVE  Patient Report: I feel nauseaous.  Pain: Patient currently has pain.  Location: R foot  Type: Acute  Quality: Aching.  Pain Scale: Numeric.  Patient reports a pain level of 7 out of 10.  Patient's acceptable level of pain 0 out of 10.   Interferes with physical activity.  Pain is alleviated by: continue pain management  Pain is exacerbated by: weight bearing No pain intervention was facilitated  because denies pain/discomfort at this time.    OBJECTIVE  General Observations: Pt seated in w/c; sternal incision well-healing open to  air; R foot with significant edema covered by dressing and sock.  Pt anxious  about going home, pt educated and comforted on current level of functional and  good safety with gait household distances.    Interventions:       Therapeutic Activities:  Pt donne pants with ModI.  Pt performed gait  training 50' bouts with RW with ModI.  Pt performed curb training with CGA/occ  minA with HHA.  Pt performed seated BLE ther ex.  Pt CM and MD discussed D/C  planning along with PT during session.  Pain Reassessment: Pain was not reassessed as no pain was reported.    ASSESSMENT  Pt to D/C home today.    PLAN  Continued Physical Therapy is not recommended at this time due to: Discharged  from hospital.    3 Hour Rule Minutes: 60 minutes of PT treatment this session count towards  intensity and duration of therapy requirement. Patient was seen for the full  scheduled time of PT treatment this session.  Therapy Mode Minutes: Individual: 60 minutes.    Signed by: Judene Companion, DPT 03/25/2014 10:00:00 AM

## 2014-03-25 NOTE — Rehab Discharge Instruction (Medilinks) (Signed)
[name]  MRN: [id number]  Account: [id number]  Session Start: 03/25/2014 12:00:00 AM  Session Stop: 03/25/2014 12:00:00 AM    Case Management  Inpatient Rehabilitation Discharge Instructions    Discharge Plan: Pt. will [place] home Fri. 03/25/14 via WC Zenaida Niece. PTS will pick up @ 1pm.  [phone]    [address]  [place], [place] [address]  [phone]    daughter [name] [phone]      Follow-up Appointment(s): Please follow up with the following.  PCP Dr. Modena Nunnery [phone]    Podiatrist Dr. John Giovanni [phone]    Vascular Sx. ***NEED ANGIOGRAM*** Dr. Garner Nash  [phone]    Pulmonology/ Sleep Med. Dr. Candis Musa [phone]    Nephrology Dr. Doran Stabler [phone]    Cardiology Dr. Donah Driver [phone]    [facility] [phone]    Additional Information: Home Health RN, Wound Care, PT, OT will be provided by  [facility] [phone]. They will call 24-48 hours after Pink to arrange initial  appointment.    For any additional questions or concerns please feel free to contact RN, CM  Jena Gauss @ [phone].    Pt. and daughter aware of above [place] plan.    Signed by: Antonietta Breach, RN 03/25/2014 10:32:00 AM

## 2014-03-30 NOTE — Patient Instructions (Signed)
Exam-specific instructions: Pre hydrate patient    Medication and NPO instructions:  Pt to remain NPO after midnight Sunday night.    Clothing instructions:    Arrival instructions:  0800 in yellow lobby.    Other reminders:

## 2014-04-04 ENCOUNTER — Ambulatory Visit
Admission: RE | Admit: 2014-04-04 | Discharge: 2014-04-04 | Disposition: A | Discharge status: Home / Self Care | Payer: Medicare Other | Source: Ambulatory Visit | Attending: Diagnostic Radiology | Admitting: Diagnostic Radiology

## 2014-04-04 ENCOUNTER — Encounter
Admission: RE | Disposition: A | Discharge status: Home / Self Care | Payer: Self-pay | Source: Ambulatory Visit | Attending: Diagnostic Radiology

## 2014-04-04 ENCOUNTER — Ambulatory Visit (HOSPITAL_BASED_OUTPATIENT_CLINIC_OR_DEPARTMENT_OTHER)
Admission: RE | Admit: 2014-04-04 | Discharge: 2014-04-04 | Disposition: A | Discharge status: Home / Self Care | Payer: Medicare Other | Source: Ambulatory Visit

## 2014-04-04 ENCOUNTER — Ambulatory Visit: Payer: BLUE CROSS/BLUE SHIELD | Admitting: Diagnostic Radiology

## 2014-04-04 DIAGNOSIS — I7092 Chronic total occlusion of artery of the extremities: Secondary | ICD-10-CM | POA: Insufficient documentation

## 2014-04-04 DIAGNOSIS — L97319 Non-pressure chronic ulcer of right ankle with unspecified severity: Secondary | ICD-10-CM | POA: Insufficient documentation

## 2014-04-04 DIAGNOSIS — N183 Chronic kidney disease, stage 3 (moderate): Secondary | ICD-10-CM | POA: Insufficient documentation

## 2014-04-04 DIAGNOSIS — I70233 Atherosclerosis of native arteries of right leg with ulceration of ankle: Secondary | ICD-10-CM | POA: Insufficient documentation

## 2014-04-04 LAB — CBC
Hematocrit: 35.7 % — ABNORMAL LOW (ref 37.0–47.0)
Hgb: 10.8 g/dL — ABNORMAL LOW (ref 12.0–16.0)
MCH: 26.7 pg — ABNORMAL LOW (ref 28.0–32.0)
MCHC: 30.3 g/dL — ABNORMAL LOW (ref 32.0–36.0)
MCV: 88.4 fL (ref 80.0–100.0)
MPV: 9.3 fL — ABNORMAL LOW (ref 9.4–12.3)
Nucleated RBC: 0 /100 WBC (ref 0–1)
Platelets: 269 10*3/uL (ref 140–400)
RBC: 4.04 10*6/uL — ABNORMAL LOW (ref 4.20–5.40)
RDW: 19 % — ABNORMAL HIGH (ref 12–15)
WBC: 6.71 10*3/uL (ref 3.50–10.80)

## 2014-04-04 LAB — BASIC METABOLIC PANEL
Anion Gap: 8 (ref 5.0–15.0)
BUN: 29 mg/dL — ABNORMAL HIGH (ref 7–19)
CO2: 24 mEq/L (ref 22–29)
Calcium: 9.4 mg/dL (ref 8.5–10.5)
Chloride: 106 mEq/L (ref 100–111)
Creatinine: 1.7 mg/dL — ABNORMAL HIGH (ref 0.6–1.0)
Glucose: 147 mg/dL — ABNORMAL HIGH (ref 70–100)
Potassium: 4.9 mEq/L (ref 3.5–5.1)
Sodium: 138 mEq/L (ref 136–145)

## 2014-04-04 LAB — PT/INR
PT INR: 1.2 — ABNORMAL HIGH (ref 0.9–1.1)
PT: 15 s (ref 12.6–15.0)

## 2014-04-04 LAB — GFR: EGFR: 36.5

## 2014-04-04 LAB — GLUCOSE WHOLE BLOOD - POCT
Whole Blood Glucose POCT: 113 mg/dL — ABNORMAL HIGH (ref 70–100)
Whole Blood Glucose POCT: 100 mg/dL (ref 70–100)

## 2014-04-04 LAB — APTT: PTT: 37 s (ref 23–37)

## 2014-04-04 SURGERY — LOWER EXTREM./PELVIC ANGIO (RUNOFF)
Anesthesia: Conscious Sedation | Laterality: Right

## 2014-04-04 MED ORDER — HEPARIN (PORCINE) IN NACL 2-0.9 UNIT/ML-% IJ SOLN
INTRAMUSCULAR | Status: DC
Start: 2014-04-04 — End: 2014-04-05
  Filled 2014-04-04: qty 1000

## 2014-04-04 MED ORDER — FENTANYL CITRATE 0.05 MG/ML IJ SOLN
INTRAMUSCULAR | Status: AC | PRN
Start: 2014-04-04 — End: 2014-04-04
  Administered 2014-04-04: 50 ug via INTRAVENOUS

## 2014-04-04 MED ORDER — MIDAZOLAM HCL 5 MG/5ML IJ SOLN
INTRAMUSCULAR | Status: AC | PRN
Start: 2014-04-04 — End: 2014-04-04
  Administered 2014-04-04: 1 mg via INTRAVENOUS

## 2014-04-04 MED ORDER — MIDAZOLAM HCL 2 MG/2ML IJ SOLN
INTRAMUSCULAR | Status: DC
Start: 2014-04-04 — End: 2014-04-05
  Filled 2014-04-04: qty 6

## 2014-04-04 MED ORDER — IODIXANOL 320 MG/ML IV SOLN
INTRAVENOUS | Status: AC
Start: 2014-04-04 — End: 2014-04-04
  Administered 2014-04-04: 21 mL
  Filled 2014-04-04: qty 100

## 2014-04-04 MED ORDER — FAMOTIDINE 20 MG/2ML IV SOLN
INTRAVENOUS | Status: AC
Start: 2014-04-04 — End: 2014-04-04
  Administered 2014-04-04: 20 mg
  Filled 2014-04-04: qty 2

## 2014-04-04 MED ORDER — NITROGLYCERIN IN D5W 200-5 MCG/ML-% IV SOLN
INTRAVENOUS | Status: DC
Start: 2014-04-04 — End: 2014-04-04
  Filled 2014-04-04: qty 250

## 2014-04-04 MED ORDER — SODIUM CHLORIDE 0.9 % IV SOLN
INTRAVENOUS | Status: DC | PRN
Start: 2014-04-04 — End: 2014-04-05
  Administered 2014-04-04: 125 mL/h via INTRAVENOUS

## 2014-04-04 MED ORDER — HEPARIN SODIUM (PORCINE) 1000 UNIT/ML IJ SOLN
INTRAMUSCULAR | Status: DC
Start: 2014-04-04 — End: 2014-04-05
  Filled 2014-04-04: qty 10

## 2014-04-04 MED ORDER — PROMETHAZINE HCL 25 MG/ML IJ SOLN
6.2500 mg | Freq: Four times a day (QID) | INTRAMUSCULAR | Status: DC | PRN
Start: 2014-04-04 — End: 2014-04-05

## 2014-04-04 MED ORDER — SODIUM CHLORIDE 0.9 % IV SOLN
INTRAVENOUS | Status: DC
Start: 2014-04-04 — End: 2014-04-05

## 2014-04-04 MED ORDER — FENTANYL CITRATE 0.05 MG/ML IJ SOLN
INTRAMUSCULAR | Status: DC
Start: 2014-04-04 — End: 2014-04-05
  Filled 2014-04-04: qty 6

## 2014-04-04 MED ORDER — LIDOCAINE HCL 2 % IJ SOLN
INTRAMUSCULAR | Status: AC
Start: 2014-04-04 — End: 2014-04-04
  Administered 2014-04-04: 10 mL
  Filled 2014-04-04: qty 20

## 2014-04-04 MED ORDER — OXYCODONE HCL 5 MG PO TABS
5.0000 mg | ORAL_TABLET | ORAL | Status: DC | PRN
Start: 2014-04-04 — End: 2014-04-05

## 2014-04-04 NOTE — OR Nursing (Signed)
Lower extremity aortoperipheral angiogram performed to right leg via left groin site using fluoroscopy guidance imaging. Left groin site dry and intact. Patient tolerated procedure well with no complications.

## 2014-04-04 NOTE — Discharge Instructions (Addendum)
Peripheral Angiography  Procedural Sedation (Adult)  You have been given medicine by vein to sedate you during your procedure. This may have included both a pain medicine and sleeping medicine. Most of the effects have worn off. But you may continue to have some drowsiness for the next 6 to 8 hours.  Home care  Follow these guidelines when you get home:   For the next 8 hours, you should be watched by a responsible adult to look for any worsening of your condition.   Do not take any oral medicine for pain or sleep during the next 4 hour, since this might react with the medicines you were given in the hospital causing a much stronger response than usual.   Do not drink any alcoholfor the next 24 hours.   Do not driveor operate dangerous machinery during the next 24 hours.  Follow-up care  Follow up with your health care provider if you are not alert and back to your usual level of activity within 12 hours.  When to seek medical advice  Call your health care provider right awayif any of these occur:   Increased drowsiness   Increased weakness or dizziness   Repeated vomiting   You cannot be awakened   2000-2015 [employer] [address], [place], [place] [address]. All rights reserved. This information is not intended as a substitute for professional medical care. Always follow your healthcare professional's instructions.         Peripheral angiography is an outpatient procedure that makes a "map" of the vessels (arteries) in your lower body, legs, and arms. This map can show where blood flow may be blocked.    Before the procedure   Tell your health care provider about all medicines you take and any allergies you may have.   Follow any directions you're given for not eating or drinking before the procedure. If your provider says to take your normal medicines, swallow them with only small sips of water.   Arrange for a family member or friend to drive you home.  During the  procedure   You may get medicine through an IV (intravenous) line to relax you. You're given an injection to numb the insertion site. Then, a tiny skin cut (incision) is made near an artery in your groin.   Your provider inserts a thin tube (catheter) through the incision. He or she then threads the catheter into an artery while looking at a video monitor.   Contrast "dye" is injected into the catheter. You may feel warmth or pressure in your legs and back. You lie still as X-rays are taken. The catheter is then taken out.  After the procedure  You'll be taken to a recovery area. A doctor or nurse will apply pressure to the site for about 10 minutes. Your doctor or nurse will tell you how long to lie down and keep the insertion site still.Your health care provider will discuss the results with you soon after the procedure.  Back at home  On the day you get home, don't drive, don't exercise, avoid walking and taking stairs, and avoid bending and lifting. Your health care provider may give you other care instructions.     [address] [employer] [address] [place], [place] [address]. All rights reserved. This information is not intended as a substitute for professional medical care. Always follow your healthcare professional's instructions.

## 2014-04-04 NOTE — Brief Op Note (Addendum)
[facility]  Interventional Radiology  Brief Operative Note     Physician: Hulda Marin, MD  Assistant: None  Pre-Operative Diagnosis: PAD and poorly-healing R ankle ulcer  Post-Operative Diagnosis: Same  Procedure: Aortoiliac and R LE runoff angiography  Anesthesia: Moderate Sedation and Local with 1% Lidocaine  EBL: Minimal  Complications: None immediate/apparent  Blood/IV Fluids: Per nursing documentation  Specimen/Tubes/Lines: None    Findings: Successful aortoiliac and R LE runoff angiography. No significant R iliac inflow disease and good inline flow with single vessel runoff to R foot via hypertrophied peroneal artery (hyperemia at ankle wound noted). No endovascular intervention performed. 21 mL Visipaque used. Patient tolerated procedure well. Findings d/w Dr. Murrell Converse.    Plan:  -- Flat bedrest with L LE straight x4 hrs  -- If stable and no L groin issues, OK to discharge home at 1945    Hulda Marin, MD  Department of CardioVascular & Interventional Radiology  Vascular and Interventional Radiology  NeuroInterventional Radiology  [facility]: [phone]  [facility]: [phone]

## 2014-04-04 NOTE — PACU (Addendum)
LEFT GROIN ASSESSED area is soft and no bleeding noted. Foley catheter in place.   Doctor informed of blood pressure 180/ 93 mmhg. No intervention at this tim. Patient was reminded to take blood pressure when she gets home. DRESSING TO LEFT GROIN CLEAN DRY AND intact. Patient left unit accompanied by RN TO CAR.

## 2014-04-04 NOTE — Progress Notes (Signed)
[facility]  Interventional Radiology  Progress Note       Patient evaluated in PACU. Hemodynamically stable. L groin soft with no TTP or significant hematoma (very small hematoma noted approximately 2 cm). Foley to be discontinued and patient is otherwise stable to go home. Case and findings discussed with patient and her family. All questions answered. Discussed with RN.      Hulda Marin, MD  Department of CardioVascular & Interventional Radiology  Vascular and Interventional Radiology  NeuroInterventional Radiology  [facility]: [phone]  [facility]: [phone]

## 2014-04-04 NOTE — H&P (Signed)
[facility]  Interventional Radiology  Pre-Operative Assessment     The patient was interviewed and examined in the preoperative holding area. The previously documented history and physical was reviewed in detail and there are no apparent interval changes to the patient's status.    Pre-Operative Diagnosis: PAD with R medial ankle non-healing ulcers  Planned Procedure: Aortoiliac and R LE runoff angiography (mainly CO2) with possible endovascular intervention    ASA Classification: ASA 3: Patient with severe systemic disease which limits normal activity  Mallampati Classification: II (soft palate, fauces and portion uvula visible)  Airway Assessment: Normal  Planned Anesthesia: Local and IV moderate sedation    The patient is medically appropriate to undergo the planned procedure. Risks, benefits and alternatives were discussed with the patient and she agrees to proceed.    Madison Marin, MD  Department of CardioVascular & Interventional Radiology  Vascular and Interventional Radiology  NeuroInterventional Radiology  [facility]: [phone]  [facility]: [phone]

## 2014-04-04 NOTE — OR Nursing (Signed)
Pre-hydration completed.  Patient repositioned and sat up on stretcher to relieve complaints of "heart burn" with a blood pressure 207/91 NSR, 71 HR, 14 RR, 95% RA sat.  After repositioning blood pressure down to 189/81 and patients states "burning" is less.  Dr. Halina Maidens aware.  Franchot Erichsen

## 2014-04-20 ENCOUNTER — Ambulatory Visit (INDEPENDENT_AMBULATORY_CARE_PROVIDER_SITE_OTHER): Payer: BLUE CROSS/BLUE SHIELD | Admitting: Cardiology

## 2014-04-20 ENCOUNTER — Ambulatory Visit: Payer: Medicare Other | Attending: Internal Medicine

## 2014-04-20 DIAGNOSIS — L97909 Non-pressure chronic ulcer of unspecified part of unspecified lower leg with unspecified severity: Secondary | ICD-10-CM

## 2014-04-20 DIAGNOSIS — I129 Hypertensive chronic kidney disease with stage 1 through stage 4 chronic kidney disease, or unspecified chronic kidney disease: Secondary | ICD-10-CM | POA: Insufficient documentation

## 2014-04-20 DIAGNOSIS — L97414 Non-pressure chronic ulcer of right heel and midfoot with necrosis of bone: Secondary | ICD-10-CM | POA: Insufficient documentation

## 2014-04-20 DIAGNOSIS — D649 Anemia, unspecified: Secondary | ICD-10-CM | POA: Insufficient documentation

## 2014-04-20 DIAGNOSIS — I252 Old myocardial infarction: Secondary | ICD-10-CM | POA: Insufficient documentation

## 2014-04-20 DIAGNOSIS — N189 Chronic kidney disease, unspecified: Secondary | ICD-10-CM | POA: Insufficient documentation

## 2014-04-20 DIAGNOSIS — I798 Other disorders of arteries, arterioles and capillaries in diseases classified elsewhere: Secondary | ICD-10-CM | POA: Insufficient documentation

## 2014-04-20 DIAGNOSIS — E785 Hyperlipidemia, unspecified: Secondary | ICD-10-CM | POA: Insufficient documentation

## 2014-04-20 DIAGNOSIS — Z87891 Personal history of nicotine dependence: Secondary | ICD-10-CM | POA: Insufficient documentation

## 2014-04-20 DIAGNOSIS — I2510 Atherosclerotic heart disease of native coronary artery without angina pectoris: Secondary | ICD-10-CM | POA: Insufficient documentation

## 2014-04-20 DIAGNOSIS — E1165 Type 2 diabetes mellitus with hyperglycemia: Secondary | ICD-10-CM | POA: Insufficient documentation

## 2014-04-20 DIAGNOSIS — J449 Chronic obstructive pulmonary disease, unspecified: Secondary | ICD-10-CM | POA: Insufficient documentation

## 2014-04-20 DIAGNOSIS — M19071 Primary osteoarthritis, right ankle and foot: Secondary | ICD-10-CM | POA: Insufficient documentation

## 2014-04-20 DIAGNOSIS — Z1619 Resistance to other specified beta lactam antibiotics: Secondary | ICD-10-CM | POA: Insufficient documentation

## 2014-04-20 DIAGNOSIS — L97314 Non-pressure chronic ulcer of right ankle with necrosis of bone: Secondary | ICD-10-CM | POA: Insufficient documentation

## 2014-04-20 MED ORDER — COLLAGENASE 250 UNIT/GM EX OINT
TOPICAL_OINTMENT | Freq: Every day | CUTANEOUS | Status: AC
Start: 2014-04-20 — End: ?

## 2014-04-20 NOTE — Addendum Note (Signed)
Addended by: Johney Maine on: 04/20/2014 05:24 PM     Modules accepted: Orders

## 2014-04-22 ENCOUNTER — Inpatient Hospital Stay: Payer: Medicare Other | Admitting: Internal Medicine

## 2014-04-22 ENCOUNTER — Emergency Department: Payer: Medicare Other

## 2014-04-22 ENCOUNTER — Inpatient Hospital Stay
Admission: EM | Admit: 2014-04-22 | Discharge: 2014-04-28 | DRG: 308 | Disposition: A | Discharge status: Other Acute Inpatient Hospital | Payer: Medicare Other | Attending: Internal Medicine | Admitting: Internal Medicine

## 2014-04-22 DIAGNOSIS — I5043 Acute on chronic combined systolic (congestive) and diastolic (congestive) heart failure: Secondary | ICD-10-CM | POA: Diagnosis not present

## 2014-04-22 DIAGNOSIS — I959 Hypotension, unspecified: Secondary | ICD-10-CM | POA: Diagnosis present

## 2014-04-22 DIAGNOSIS — R001 Bradycardia, unspecified: Secondary | ICD-10-CM | POA: Insufficient documentation

## 2014-04-22 DIAGNOSIS — M86671 Other chronic osteomyelitis, right ankle and foot: Secondary | ICD-10-CM | POA: Diagnosis present

## 2014-04-22 DIAGNOSIS — IMO0002 Reserved for concepts with insufficient information to code with codable children: Secondary | ICD-10-CM

## 2014-04-22 DIAGNOSIS — N2581 Secondary hyperparathyroidism of renal origin: Secondary | ICD-10-CM | POA: Diagnosis present

## 2014-04-22 DIAGNOSIS — E875 Hyperkalemia: Secondary | ICD-10-CM | POA: Diagnosis present

## 2014-04-22 DIAGNOSIS — I071 Rheumatic tricuspid insufficiency: Secondary | ICD-10-CM | POA: Diagnosis present

## 2014-04-22 DIAGNOSIS — D631 Anemia in chronic kidney disease: Secondary | ICD-10-CM | POA: Diagnosis present

## 2014-04-22 DIAGNOSIS — Z951 Presence of aortocoronary bypass graft: Secondary | ICD-10-CM

## 2014-04-22 DIAGNOSIS — I442 Atrioventricular block, complete: Principal | ICD-10-CM | POA: Diagnosis present

## 2014-04-22 DIAGNOSIS — E1165 Type 2 diabetes mellitus with hyperglycemia: Secondary | ICD-10-CM | POA: Diagnosis present

## 2014-04-22 DIAGNOSIS — I5032 Chronic diastolic (congestive) heart failure: Secondary | ICD-10-CM

## 2014-04-22 DIAGNOSIS — L97309 Non-pressure chronic ulcer of unspecified ankle with unspecified severity: Secondary | ICD-10-CM | POA: Diagnosis present

## 2014-04-22 DIAGNOSIS — I272 Other secondary pulmonary hypertension: Secondary | ICD-10-CM | POA: Diagnosis present

## 2014-04-22 DIAGNOSIS — N289 Disorder of kidney and ureter, unspecified: Secondary | ICD-10-CM

## 2014-04-22 DIAGNOSIS — I255 Ischemic cardiomyopathy: Secondary | ICD-10-CM | POA: Diagnosis present

## 2014-04-22 DIAGNOSIS — J449 Chronic obstructive pulmonary disease, unspecified: Secondary | ICD-10-CM | POA: Diagnosis present

## 2014-04-22 DIAGNOSIS — Z794 Long term (current) use of insulin: Secondary | ICD-10-CM

## 2014-04-22 DIAGNOSIS — I2510 Atherosclerotic heart disease of native coronary artery without angina pectoris: Secondary | ICD-10-CM | POA: Diagnosis present

## 2014-04-22 DIAGNOSIS — J690 Pneumonitis due to inhalation of food and vomit: Secondary | ICD-10-CM

## 2014-04-22 DIAGNOSIS — E785 Hyperlipidemia, unspecified: Secondary | ICD-10-CM | POA: Diagnosis present

## 2014-04-22 DIAGNOSIS — E119 Type 2 diabetes mellitus without complications: Secondary | ICD-10-CM

## 2014-04-22 DIAGNOSIS — D509 Iron deficiency anemia, unspecified: Secondary | ICD-10-CM | POA: Diagnosis present

## 2014-04-22 DIAGNOSIS — I350 Nonrheumatic aortic (valve) stenosis: Secondary | ICD-10-CM | POA: Diagnosis present

## 2014-04-22 DIAGNOSIS — I129 Hypertensive chronic kidney disease with stage 1 through stage 4 chronic kidney disease, or unspecified chronic kidney disease: Secondary | ICD-10-CM | POA: Diagnosis present

## 2014-04-22 DIAGNOSIS — E872 Acidosis: Secondary | ICD-10-CM | POA: Diagnosis present

## 2014-04-22 DIAGNOSIS — I739 Peripheral vascular disease, unspecified: Secondary | ICD-10-CM | POA: Diagnosis present

## 2014-04-22 DIAGNOSIS — N170 Acute kidney failure with tubular necrosis: Secondary | ICD-10-CM | POA: Diagnosis present

## 2014-04-22 DIAGNOSIS — I252 Old myocardial infarction: Secondary | ICD-10-CM

## 2014-04-22 DIAGNOSIS — N184 Chronic kidney disease, stage 4 (severe): Secondary | ICD-10-CM | POA: Diagnosis present

## 2014-04-22 DIAGNOSIS — Z87891 Personal history of nicotine dependence: Secondary | ICD-10-CM

## 2014-04-22 LAB — COMPREHENSIVE METABOLIC PANEL
ALT: 88 U/L — ABNORMAL HIGH (ref 0–55)
AST (SGOT): 35 U/L — ABNORMAL HIGH (ref 5–34)
Albumin/Globulin Ratio: 0.8 — ABNORMAL LOW (ref 0.9–2.2)
Albumin: 2.9 g/dL — ABNORMAL LOW (ref 3.5–5.0)
Alkaline Phosphatase: 170 U/L — ABNORMAL HIGH (ref 37–106)
Anion Gap: 9 (ref 5.0–15.0)
BUN: 91 mg/dL — ABNORMAL HIGH (ref 7–19)
Bilirubin, Total: 0.3 mg/dL (ref 0.2–1.2)
CO2: 18 mEq/L — ABNORMAL LOW (ref 22–29)
Calcium: 8.5 mg/dL (ref 8.5–10.5)
Chloride: 108 mEq/L (ref 100–111)
Creatinine: 3.5 mg/dL — ABNORMAL HIGH (ref 0.6–1.0)
Globulin: 3.7 g/dL — ABNORMAL HIGH (ref 2.0–3.6)
Glucose: 228 mg/dL — ABNORMAL HIGH (ref 70–100)
Potassium: 5.2 mEq/L — ABNORMAL HIGH (ref 3.5–5.1)
Protein, Total: 6.6 g/dL (ref 6.0–8.3)
Sodium: 135 mEq/L — ABNORMAL LOW (ref 136–145)

## 2014-04-22 LAB — BASIC METABOLIC PANEL
Anion Gap: 8 (ref 5.0–15.0)
BUN: 98 mg/dL — ABNORMAL HIGH (ref 7–19)
CO2: 19 meq/L — ABNORMAL LOW (ref 22–29)
Calcium: 8.1 mg/dL — ABNORMAL LOW (ref 8.5–10.5)
Chloride: 108 meq/L (ref 100–111)
Creatinine: 3.6 mg/dL — ABNORMAL HIGH (ref 0.6–1.0)
Glucose: 160 mg/dL — ABNORMAL HIGH (ref 70–100)
Potassium: 5.4 meq/L — ABNORMAL HIGH (ref 3.5–5.1)
Sodium: 135 meq/L — ABNORMAL LOW (ref 136–145)

## 2014-04-22 LAB — CBC AND DIFFERENTIAL
Basophils Absolute Automated: 0.02 10*3/uL (ref 0.00–0.20)
Basophils Automated: 0 %
Eosinophils Absolute Automated: 0.2 10*3/uL (ref 0.00–0.70)
Eosinophils Automated: 4 %
Hematocrit: 30.5 % — ABNORMAL LOW (ref 37.0–47.0)
Hgb: 9.7 g/dL — ABNORMAL LOW (ref 12.0–16.0)
Immature Granulocytes Absolute: 0.01 10*3/uL
Immature Granulocytes: 0 %
Lymphocytes Absolute Automated: 0.95 10*3/uL (ref 0.50–4.40)
Lymphocytes Automated: 19 %
MCH: 27.6 pg — ABNORMAL LOW (ref 28.0–32.0)
MCHC: 31.8 g/dL — ABNORMAL LOW (ref 32.0–36.0)
MCV: 86.6 fL (ref 80.0–100.0)
MPV: 10.3 fL (ref 9.4–12.3)
Monocytes Absolute Automated: 0.37 10*3/uL (ref 0.00–1.20)
Monocytes: 8 %
Neutrophils Absolute: 3.4 10*3/uL (ref 1.80–8.10)
Neutrophils: 69 %
Nucleated RBC: 0 /100 WBC (ref 0–1)
Platelets: 222 10*3/uL (ref 140–400)
RBC: 3.52 10*6/uL — ABNORMAL LOW (ref 4.20–5.40)
RDW: 19 % — ABNORMAL HIGH (ref 12–15)
WBC: 4.94 10*3/uL (ref 3.50–10.80)

## 2014-04-22 LAB — TROPONIN I: Troponin I: 0.03 ng/mL (ref 0.00–0.09)

## 2014-04-22 LAB — GLUCOSE WHOLE BLOOD - POCT
Whole Blood Glucose POCT: 153 mg/dL — ABNORMAL HIGH (ref 70–100)
Whole Blood Glucose POCT: 133 mg/dL — ABNORMAL HIGH (ref 70–100)

## 2014-04-22 LAB — PT/INR
PT INR: 1.3 — ABNORMAL HIGH (ref 0.9–1.1)
PT: 16.2 s — ABNORMAL HIGH (ref 12.6–15.0)

## 2014-04-22 LAB — GFR
EGFR: 15.9
EGFR: 15.4

## 2014-04-22 MED ORDER — SODIUM CHLORIDE 0.45 % IV SOLN
INTRAVENOUS | Status: DC
Start: 2014-04-23 — End: 2014-04-25
  Administered 2014-04-23 – 2014-04-24 (×2): 50 mL/h via INTRAVENOUS

## 2014-04-22 MED ORDER — HYDRALAZINE HCL 20 MG/ML IJ SOLN
5.0000 mg | Freq: Four times a day (QID) | INTRAMUSCULAR | Status: DC | PRN
Start: 2014-04-22 — End: 2014-04-28
  Administered 2014-04-23 – 2014-04-28 (×8): 5 mg via INTRAVENOUS
  Filled 2014-04-22 (×9): qty 1

## 2014-04-22 MED ORDER — SODIUM CHLORIDE 0.9 % IV SOLN
10.0000 mg | Freq: Once | INTRAVENOUS | Status: AC
Start: 2014-04-22 — End: 2014-04-22
  Administered 2014-04-22: 10 mg via INTRAVENOUS
  Filled 2014-04-22: qty 10

## 2014-04-22 MED ORDER — HYDROCODONE-ACETAMINOPHEN 5-325 MG PO TABS
1.0000 | ORAL_TABLET | ORAL | Status: DC | PRN
Start: 2014-04-22 — End: 2014-04-28

## 2014-04-22 MED ORDER — LEVOFLOXACIN IN D5W 750 MG/150ML IV SOLN
750.0000 mg | Freq: Once | INTRAVENOUS | Status: AC
Start: 2014-04-22 — End: 2014-04-22
  Administered 2014-04-22: 750 mg via INTRAVENOUS
  Filled 2014-04-22: qty 150

## 2014-04-22 MED ORDER — ONDANSETRON HCL 4 MG/2ML IJ SOLN
4.0000 mg | Freq: Once | INTRAMUSCULAR | Status: AC
Start: 2014-04-22 — End: 2014-04-22

## 2014-04-22 MED ORDER — INSULIN ASPART 100 UNIT/ML SC SOLN
1.0000 [IU] | Freq: Three times a day (TID) | SUBCUTANEOUS | Status: DC | PRN
Start: 2014-04-22 — End: 2014-04-27
  Administered 2014-04-23: 1 [IU] via SUBCUTANEOUS
  Filled 2014-04-22: qty 1

## 2014-04-22 MED ORDER — ATORVASTATIN CALCIUM 10 MG PO TABS
10.0000 mg | ORAL_TABLET | Freq: Every evening | ORAL | Status: DC
Start: 2014-04-22 — End: 2014-04-28
  Administered 2014-04-22 – 2014-04-26 (×5): 10 mg via ORAL
  Filled 2014-04-22 (×5): qty 1

## 2014-04-22 MED ORDER — GLUCAGON 1 MG IJ SOLR (WRAP)
3.0000 mg/h | Freq: Once | INTRAVENOUS | Status: DC
Start: 2014-04-22 — End: 2014-04-22
  Filled 2014-04-22: qty 10

## 2014-04-22 MED ORDER — OXYCODONE-ACETAMINOPHEN 5-325 MG PO TABS
1.0000 | ORAL_TABLET | ORAL | Status: DC | PRN
Start: 2014-04-22 — End: 2014-04-28
  Administered 2014-04-23 – 2014-04-24 (×2): 1 via ORAL
  Filled 2014-04-22 (×2): qty 1

## 2014-04-22 MED ORDER — VENLAFAXINE HCL 25 MG PO TABS
75.0000 mg | ORAL_TABLET | Freq: Two times a day (BID) | ORAL | Status: DC
Start: 2014-04-22 — End: 2014-04-28
  Administered 2014-04-22 – 2014-04-28 (×12): 75 mg via ORAL
  Filled 2014-04-22 (×8): qty 3
  Filled 2014-04-22: qty 1
  Filled 2014-04-22 (×4): qty 3
  Filled 2014-04-22: qty 2

## 2014-04-22 MED ORDER — ACETAMINOPHEN 325 MG PO TABS
650.0000 mg | ORAL_TABLET | ORAL | Status: DC | PRN
Start: 2014-04-22 — End: 2014-04-28
  Administered 2014-04-26: 650 mg via ORAL
  Filled 2014-04-22: qty 2

## 2014-04-22 MED ORDER — LIDOCAINE 5 % EX PTCH
2.0000 | MEDICATED_PATCH | Freq: Every day | CUTANEOUS | Status: DC | PRN
Start: 2014-04-22 — End: 2014-04-28

## 2014-04-22 MED ORDER — GLUCAGON 1 MG IJ SOLR (WRAP)
2.0000 mg | Freq: Once | INTRAMUSCULAR | Status: AC
Start: 2014-04-22 — End: 2014-04-22
  Administered 2014-04-22: 2 mg via INTRAVENOUS
  Filled 2014-04-22: qty 2

## 2014-04-22 MED ORDER — ENOXAPARIN SODIUM 30 MG/0.3ML SC SOLN
30.0000 mg | Freq: Every day | SUBCUTANEOUS | Status: DC
Start: 2014-04-22 — End: 2014-04-24
  Administered 2014-04-22 – 2014-04-23 (×2): 30 mg via SUBCUTANEOUS
  Filled 2014-04-22 (×2): qty 0.3

## 2014-04-22 MED ORDER — NALOXONE HCL 0.4 MG/ML IJ SOLN
0.2000 mg | INTRAMUSCULAR | Status: DC | PRN
Start: 2014-04-22 — End: 2014-04-28

## 2014-04-22 MED ORDER — ACETAMINOPHEN 325 MG PO TABS
650.0000 mg | ORAL_TABLET | ORAL | Status: DC | PRN
Start: 2014-04-22 — End: 2014-04-28
  Filled 2014-04-22: qty 2

## 2014-04-22 MED ORDER — SODIUM POLYSTYRENE SULFONATE 15 GM/60ML PO SUSP
15.0000 g | Freq: Once | ORAL | Status: AC
Start: 2014-04-22 — End: 2014-04-23
  Administered 2014-04-23: 15 g via ORAL
  Filled 2014-04-22: qty 60

## 2014-04-22 MED ORDER — FUROSEMIDE 20 MG PO TABS
20.0000 mg | ORAL_TABLET | Freq: Every day | ORAL | Status: DC
Start: 2014-04-23 — End: 2014-04-23
  Administered 2014-04-23: 20 mg via ORAL
  Filled 2014-04-22: qty 1

## 2014-04-22 MED ORDER — GABAPENTIN 300 MG PO CAPS
300.0000 mg | ORAL_CAPSULE | Freq: Two times a day (BID) | ORAL | Status: DC
Start: 2014-04-22 — End: 2014-04-28
  Administered 2014-04-22 – 2014-04-28 (×12): 300 mg via ORAL
  Filled 2014-04-22 (×12): qty 1

## 2014-04-22 MED ORDER — ACETAMINOPHEN 650 MG RE SUPP
650.0000 mg | RECTAL | Status: DC | PRN
Start: 2014-04-22 — End: 2014-04-28

## 2014-04-22 MED ORDER — PROMETHAZINE HCL 25 MG/ML IJ SOLN
6.2500 mg | Freq: Four times a day (QID) | INTRAMUSCULAR | Status: DC | PRN
Start: 2014-04-22 — End: 2014-04-28
  Administered 2014-04-25 – 2014-04-28 (×4): 6.25 mg via INTRAVENOUS
  Filled 2014-04-22 (×4): qty 1

## 2014-04-22 MED ORDER — ASPIRIN 325 MG PO TBEC
325.0000 mg | DELAYED_RELEASE_TABLET | Freq: Every day | ORAL | Status: DC
Start: 2014-04-22 — End: 2014-04-28
  Administered 2014-04-22 – 2014-04-28 (×7): 325 mg via ORAL
  Filled 2014-04-22 (×8): qty 1

## 2014-04-22 MED ORDER — CALCITRIOL 0.25 MCG PO CAPS
0.2500 ug | ORAL_CAPSULE | Freq: Every day | ORAL | Status: DC
Start: 2014-04-23 — End: 2014-04-28
  Administered 2014-04-23 – 2014-04-28 (×6): 0.25 ug via ORAL
  Filled 2014-04-22 (×6): qty 1

## 2014-04-22 MED ORDER — ONDANSETRON HCL 4 MG/2ML IJ SOLN
INTRAMUSCULAR | Status: AC
Start: 2014-04-22 — End: 2014-04-22
  Administered 2014-04-22: 4 mg via INTRAVENOUS
  Filled 2014-04-22: qty 2

## 2014-04-22 NOTE — ED Notes (Signed)
Pt reports lightheadedness is worse, denies h/a, denies CP

## 2014-04-22 NOTE — ED Notes (Signed)
Per Dr. Judi Saa, cardiologist Dr. Betti Cruz, states will try glucagon drip to correct bradycardia with 3rd degree block before initiation of pacing.

## 2014-04-22 NOTE — ED Notes (Signed)
RN & MD bedside to pace

## 2014-04-22 NOTE — ED Notes (Signed)
Pacer pad place on patient and nurse, emt and MD in room.

## 2014-04-22 NOTE — ED Notes (Signed)
MD bedside again with RN

## 2014-04-22 NOTE — ED Notes (Signed)
Dexi=211 - downtime override

## 2014-04-22 NOTE — ED Provider Notes (Addendum)
EMERGENCY DEPARTMENT NOTE    Physician/Midlevel provider first contact with patient: 04/22/14 1340         HISTORY OF PRESENT ILLNESS   Historian:Patient, daughter, home health nurse  Translator Used: No    Chief Complaint: Bradycardia         64 y.o. female resents to the ER with bradycardia.  Patient hasn't been feeling well for 2 days but did not want to come to the hospital.  Today, home health nurse came to her home and stated she must go to the emergency department.  Patient did not want to come by EMS, so family drove her to the emergency department.  Patient denies any chest pain or shortness of breath.  Patient c/o dizziness.  Pt denies any nausea or vomiting. 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 Pt states has not been taking her hydralazine for 2 days because states it made her dizzy/.     1. Location of symptoms: bradycardia  2. Onset of symptoms:  2 days  3. What was patient doing when symptoms started (Context): see above  4. Severity: severe  5. Timing: constant  6. Activities that worsen symptoms: beta blockers  7. Activities that improve symptoms: none  8. Quality: bradycardia  9. Radiation of symptoms: no  10. Associated signs and Symptoms: see above  11. Are symptoms worsening? yes  MEDICAL HISTORY     Past Medical History:  Past Medical History   Diagnosis Date   . Diabetes mellitus    . Chronic kidney disease    . Myocardial infarction    . Hypertension    . Hyperlipidemia    . Peripheral arterial disease      06/28/13 right superficial femoral artery stenosis, tibial peroneal trunk stenosis, left CVL a stenosis, treated with angiography and angioplasty.   . Coronary artery disease      Left circumflex artery presumed DES.,  Chronically occluded right coronary artery stent   . Anemia      She redid to chronic disease and iron deficiency.     . Arterial leg ulcer      2014, receiving home health   . Chronic obstructive pulmonary disease      Previously on Spiriva and Advair       Past Surgical  History:  Past Surgical History   Procedure Laterality Date   . Cardiac angiography and angioplasty  06/2013   . Colonoscopy  Unknown   . Tubal ligation     . Coronary artery bypass N/A 03/04/2014     Procedure: CORONARY ARTERY BYPASS;  Surgeon: Austin Miles, MD;  Location: [facility] HEART OR;  Service: Cardiovascular;  Laterality: N/A;  LIMA to LAD  SVG to OM   . Endoscopic,vein harvest Left 03/04/2014     Procedure: ENDOSCOPIC,VEIN HARVEST;  Surgeon: Austin Miles, MD;  Location: [facility] HEART OR;  Service: Cardiovascular;  Laterality: Left;  By D. Lazarus Gowda, RNFA. Left groin to mid-calf.   Rhae Hammock N/A 03/04/2014     Procedure: TEE;  Surgeon: Austin Miles, MD;  Location: [facility] HEART OR;  Service: Cardiovascular;  Laterality: N/A;  probe [id number]       Social History:  History     Social History   . Marital Status: Widowed     Spouse Name: N/A     Number of Children: N/A   . Years of Education: N/A     Occupational History   . Not on file.     Social History Main Topics   .  Smoking status: Former Smoker -- 1.00 packs/day for 20 years     Quit date: 02/29/2012   . Smokeless tobacco: Not on file   . Alcohol Use: Not on file   . Drug Use: Not on file   . Sexual Activity: Not on file     Other Topics Concern   . Not on file     Social History Narrative       Family History:  Family History   Problem Relation Age of Onset   . Coronary artery disease Mother      Died at 60 from MI.   Marland Kitchen Coronary artery disease Father      Died at 21 from MI   . Diabetes type II       Mother and father       Outpatient Medication:  Previous Medications    ACETAMINOPHEN (TYLENOL) 325 MG TABLET    Take 2 tablets (650 mg total) by mouth every 4 (four) hours as needed for Pain.    AMIODARONE (PACERONE) 200 MG TABLET    Take 1 tablet (200 mg total) by mouth daily.    AMLODIPINE (NORVASC) 10 MG TABLET    Take 1 tablet (10 mg total) by mouth daily.    AMLODIPINE (NORVASC) 10 MG TABLET    Take 1 tablet (10 mg total) by mouth daily.    ASPIRIN 325 MG  TABLET    Take 1 tablet (325 mg total) by mouth daily.    ASPIRIN EC 325 MG EC TABLET    Take 1 tablet (325 mg total) by mouth daily.    ATORVASTATIN (LIPITOR) 10 MG TABLET    Take 10 mg by mouth nightly.    ATORVASTATIN (LIPITOR) 10 MG TABLET    Take 1 tablet (10 mg total) by mouth nightly.    CALCITRIOL (ROCALTROL) 0.25 MCG CAPSULE    Take 1 capsule (0.25 mcg total) by mouth daily.    CARVEDILOL (COREG) 25 MG TABLET    Take 1 tablet (25 mg total) by mouth every 12 (twelve) hours.    CARVEDILOL (COREG) 25 MG TABLET    Take 1 tablet (25 mg total) by mouth every 12 (twelve) hours.    COLLAGENASE (SANTYL) OINTMENT    Apply topically daily. The affected wound area.    FUROSEMIDE (LASIX) 20 MG TABLET    Take 20 mg by mouth daily.       FUROSEMIDE (LASIX) 20 MG TABLET    Take 1 tablet (20 mg total) by mouth daily.    GABAPENTIN (NEURONTIN) 300 MG CAPSULE    Take 1 capsule (300 mg total) by mouth 2 (two) times daily.    HYDRALAZINE (APRESOLINE) 100 MG TABLET    Take 1 tablet (100 mg total) by mouth 3 (three) times daily.    INSULIN ASPART (NOVOLOG) 100 UNIT/ML INJECTION    Inject 1-4 Units into the skin 3 (three) times daily before meals as needed for High Blood Sugar. #90 insulin syringes and insulin needles    LIDOCAINE (LIDODERM) 5 %    Place 2 patches onto the skin daily as needed. Remove & Discard patch within 12 hours or as directed by MD    OXYCODONE-ACETAMINOPHEN (PERCOCET) 5-325 MG PER TABLET    Take 1 tablet by mouth every 4 (four) hours as needed.    OXYCODONE-ACETAMINOPHEN (PERCOCET) 5-325 MG PER TABLET    Take 1 tablet by mouth every 4 (four) hours as needed.    POLYETHYLENE GLYCOL (MIRALAX) PACKET  Take 17 g by mouth daily.    TRAMADOL (ULTRAM) 50 MG TABLET    Take 1 tablet (50 mg total) by mouth every 4 (four) hours as needed.    TRAMADOL (ULTRAM) 50 MG TABLET    Take 1 tablet (50 mg total) by mouth every 6 (six) hours as needed.    VENLAFAXINE (EFFEXOR) 75 MG TABLET    Take 75 mg by mouth 2 (two) times  daily.    VENLAFAXINE (EFFEXOR) 75 MG TABLET    Take 1 tablet (75 mg total) by mouth 2 (two) times daily.         REVIEW OF SYSTEMS   Review of Systems   Constitutional: Negative for fever.   Respiratory: Negative for shortness of breath and wheezing.    Cardiovascular: Negative for chest pain.   Gastrointestinal: Negative for nausea and vomiting.   Neurological: Positive for dizziness. Negative for loss of consciousness and headaches.   All other systems reviewed and are negative.      PHYSICAL EXAM   ED Triage Vitals   Enc Vitals Group      BP 04/22/14 1350 132/58 mmHg      Heart Rate 04/22/14 1350 33      Resp Rate 04/22/14 1350 20      Temp 04/22/14 1350 97.7 F (36.5 C)      Temp src --       SpO2 04/22/14 1350 98 %      Weight 04/22/14 1350 75.1 kg      Height --       Head Cir --       Peak Flow --       Pain Score --       Pain Loc --       Pain Edu? --       Excl. in GC? --      Nursing note and vitals reviewed.  Constitutional:  Well developed, well nourished. Awake & Oriented x3.  Head:  Atraumatic. Normocephalic.    Eyes:  PERRL. EOMI. Conjunctivae are not pale.  ENT:  Mucous membranes are dry and intact. Oropharynx is clear and symmetric.  Patent airway.  Neck:  Supple. Full ROM.    Cardiovascular:  Regular rate. Regular rhythm. No murmurs, rubs, or gallops.  Pulmonary/Chest:  No evidence of respiratory distress. Clear to auscultation bilaterally.  No wheezing, rales or rhonchi.   Abdominal:  Soft and non-distended. There is no tenderness. No rebound, guarding, or rigidity.  Back:  Full ROM. Nontender.  Extremities:  + 1 bilateral lower extremity edema. No cyanosis. No clubbing. Full range of motion in all extremities.  Skin:  Skin is warm and dry.  No diaphoresis. No rash.   Neurological:  Alert, awake, and appropriate. Normal speech. Motor normal.  Psychiatric:  Good eye contact. Normal interaction, affect, and behavior.        MEDICAL DECISION MAKING   Pt given IV fluids for hydration  CXR to r/o  pneumonia  Troponin to r/o ischemia    Admit to ICU for complete heart block    Pt and family agree with admission    Discussed with Dr Betti Cruz - CARDIOLOGIST    Discussed case with Dr Reece Agar. Silis  Who agrees to  admit.   DISCUSSION      Vital Signs: Reviewed the patient?s vital signs.   Nursing Notes: Reviewed and utilized available nursing notes.  Medical Records Reviewed: Reviewed available past medical records.  Counseling: The emergency  provider has spoken with the patient and discussed today?s findings, in addition to providing specific details for the plan of care.  Questions are answered and there is agreement with the plan.      CARDIAC STUDIES    The following cardiac studies were independently interpreted by the Emergency Medicine Physician.  For full cardiac study results please see chart.    Monitor Strip  Interpreted by ED Physician  Rate: 27-34  Rhythm: complete heart block- junctional rhythm  ST Changes: none    EKG Interpretation:  Signed and interpreted byED Physician   Time Interpreted: 1352  Comparison: none  Rate: 35  Rhythm: bradycardia  Axis: normal  Intervals: irregular  Blocks: junctional vs 3rd degree heart block  ST segments: nml  Interpretation: complete heart block vs junctional rhythm EKG      EKG Interpretation:  Signed and interpreted byED Physician   Time Interpreted: 1438  Comparison: none  Rate: 33  Rhythm: bradycardia  Axis: normal  Intervals: irregular  Blocks: junctional vs 3rd degree heart block  ST segments: nml  Interpretation: complete heart block vs junctional rhythm EKG      IMAGING STUDIES    The following imaging studies were independently interpreted by the Emergency Medicine Physician.  For full imaging study results please see chart.  cxr reviewed by me      PULSE OXIMETRY    Oxygen Saturation by Pulse Oximetry: 96%  Interventions: 2 l nc  Interpretation:  nml    EMERGENCY DEPT. MEDICATIONS      ED Medication Orders     Start     Status Ordering Provider    04/22/14 1501   levofloxacin (LEVAQUIN) 750mg  in D5W IVPB (premix)   Once     Route: Intravenous  Ordered Dose: 750 mg     Ordered Loetta Rough    04/22/14 1410  glucagon (rDNA) (GLUCAGEN) injection 2 mg   Once     Route: Intravenous  Ordered Dose: 2 mg     Last MAR action:  Given Kerby Borner G          LABORATORY RESULTS    Ordered and independently interpreted AVAILABLE laboratory tests. Please see results section in chart for full details.  Results for orders placed or performed during the hospital encounter of 04/22/14   Comprehensive metabolic panel   Result Value Ref Range    Glucose 228 (H) 70 - 100 mg/dL    BUN 91 (H) 7 - 19 mg/dL    Creatinine 3.5 (H) 0.6 - 1.0 mg/dL    Sodium 811 (L) 914 - 145 mEq/L    Potassium 5.2 (H) 3.5 - 5.1 mEq/L    Chloride 108 100 - 111 mEq/L    CO2 18 (L) 22 - 29 mEq/L    CALCIUM 8.5 8.5 - 10.5 mg/dL    Protein, Total 6.6 6.0 - 8.3 g/dL    Albumin 2.9 (L) 3.5 - 5.0 g/dL    AST (SGOT) 35 (H) 5 - 34 U/L    ALT 88 (H) 0 - 55 U/L    Alkaline Phosphatase 170 (H) 37 - 106 U/L    Bilirubin, Total 0.3 0.2 - 1.2 mg/dL    Globulin 3.7 (H) 2.0 - 3.6 g/dL    Albumin/Globulin Ratio 0.8 (L) 0.9 - 2.2    Anion Gap 9.0 5.0 - 15.0   CBC with differential   Result Value Ref Range    WBC 4.94 3.50 - 10.80 x10 3/uL  Hgb 9.7 (L) 12.0 - 16.0 g/dL    Hematocrit 16.1 (L) 37.0 - 47.0 %    Platelets 222 140 - 400 x10 3/uL    RBC 3.52 (L) 4.20 - 5.40 x10 6/uL    MCV 86.6 80.0 - 100.0 fL    MCH 27.6 (L) 28.0 - 32.0 pg    MCHC 31.8 (L) 32.0 - 36.0 g/dL    RDW 19 (H) 12 - 15 %    MPV 10.3 9.4 - 12.3 fL    Neutrophils 69 None %    Lymphocytes Automated 19 None %    Monocytes 8 None %    Eosinophils Automated 4 None %    Basophils Automated 0 None %    Immature Granulocyte 0 None %    Nucleated RBC 0 0 - 1 /100 WBC    Neutrophils Absolute 3.40 1.80 - 8.10 x10 3/uL    Abs Lymph Automated 0.95 0.50 - 4.40 x10 3/uL    Abs Mono Automated 0.37 0.00 - 1.20 x10 3/uL    Abs Eos Automated 0.20 0.00 - 0.70 x10 3/uL     Absolute Baso Automated 0.02 0.00 - 0.20 x10 3/uL    Absolute Immature Granulocyte 0.01 0 x10 3/uL   Troponin I   Result Value Ref Range    Troponin I 0.03 0.00 - 0.09 ng/mL   Prothrombin time/INR   Result Value Ref Range    PT 16.2 (H) 12.6 - 15.0 sec    PT INR 1.3 (H) 0.9 - 1.1    PT Anticoag. Given Within 48 hrs. None    GFR   Result Value Ref Range    EGFR 15.9    ECG 12 lead   Result Value Ref Range    Ventricular Rate 35 BPM    Atrial Rate 35 BPM    P-R Interval  ms    QRS Duration 82 ms    Q-T Interval 548 ms    QTC Calculation (Bezet) 418 ms    P Axis  degrees    R Axis 21 degrees    T Axis 153 degrees   ECG 12 Lead-Repeat   Result Value Ref Range    Ventricular Rate 33 BPM    Atrial Rate 267 BPM    P-R Interval  ms    QRS Duration 86 ms    Q-T Interval 560 ms    QTC Calculation (Bezet) 414 ms    P Axis  degrees    R Axis 23 degrees    T Axis 119 degrees       CRITICAL CARE/PROCEDURES    CRITICAL CARE: The high probability of sudden, clinically significant deterioration in the patient's condition required the highest level of my preparedness to intervene urgently.    The services I provided to this patient were to treat and/or prevent clinically significant deterioration that could result in: death.  Services included the following: chart data review, reviewing nursing notes and/or old charts, documentation time, consultant collaboration regarding findings and treatment options, medication orders and management, direct patient care, re-evaluations, vital sign assessments and ordering, interpreting and reviewing diagnostic studies/lab tests.    Aggregate critical care time was >35 minutes  which includes only time during which I was engaged in work directly related to the patient's care, as described above, whether at the bedside or elsewhere in the Emergency Department.  It did not include time spent performing other reported procedures or the services of residents, students, nurses or physician  assistants.      DIAGNOSIS      Diagnosis:  Final diagnoses:   Junctional bradycardia   Aspiration pneumonia of right lower lobe, unspecified aspiration pneumonia type   Renal insufficiency   Diabetes mellitus type II, uncontrolled       Disposition:  ED Disposition     Admit Admitting Physician: Mechele Dawley [[id number]]  Diagnosis: Complete heart block [167240]  Estimated Length of Stay: > or = to 2 midnights  Tentative Discharge Plan?: Home-Health Care Svc [6]  Patient Class: Inpatient [101]  I certify that inpatient services are medically necessary for this patient. Please see H&P and MD progress notes for additional information about the patient's course of treatment. For Medicare patients, services provided in accordance with 412.3 and expected LOS to be greater than 2 midnights for Medicare patients.: Yes            Prescriptions:  Patient's Medications   New Prescriptions    No medications on file   Previous Medications    ACETAMINOPHEN (TYLENOL) 325 MG TABLET    Take 2 tablets (650 mg total) by mouth every 4 (four) hours as needed for Pain.    AMIODARONE (PACERONE) 200 MG TABLET    Take 1 tablet (200 mg total) by mouth daily.    AMLODIPINE (NORVASC) 10 MG TABLET    Take 1 tablet (10 mg total) by mouth daily.    AMLODIPINE (NORVASC) 10 MG TABLET    Take 1 tablet (10 mg total) by mouth daily.    ASPIRIN 325 MG TABLET    Take 1 tablet (325 mg total) by mouth daily.    ASPIRIN EC 325 MG EC TABLET    Take 1 tablet (325 mg total) by mouth daily.    ATORVASTATIN (LIPITOR) 10 MG TABLET    Take 10 mg by mouth nightly.    ATORVASTATIN (LIPITOR) 10 MG TABLET    Take 1 tablet (10 mg total) by mouth nightly.    CALCITRIOL (ROCALTROL) 0.25 MCG CAPSULE    Take 1 capsule (0.25 mcg total) by mouth daily.    CARVEDILOL (COREG) 25 MG TABLET    Take 1 tablet (25 mg total) by mouth every 12 (twelve) hours.    CARVEDILOL (COREG) 25 MG TABLET    Take 1 tablet (25 mg total) by mouth every 12 (twelve) hours.    COLLAGENASE (SANTYL)  OINTMENT    Apply topically daily. The affected wound area.    FUROSEMIDE (LASIX) 20 MG TABLET    Take 20 mg by mouth daily.       FUROSEMIDE (LASIX) 20 MG TABLET    Take 1 tablet (20 mg total) by mouth daily.    GABAPENTIN (NEURONTIN) 300 MG CAPSULE    Take 1 capsule (300 mg total) by mouth 2 (two) times daily.    HYDRALAZINE (APRESOLINE) 100 MG TABLET    Take 1 tablet (100 mg total) by mouth 3 (three) times daily.    INSULIN ASPART (NOVOLOG) 100 UNIT/ML INJECTION    Inject 1-4 Units into the skin 3 (three) times daily before meals as needed for High Blood Sugar. #90 insulin syringes and insulin needles    LIDOCAINE (LIDODERM) 5 %    Place 2 patches onto the skin daily as needed. Remove & Discard patch within 12 hours or as directed by MD    OXYCODONE-ACETAMINOPHEN (PERCOCET) 5-325 MG PER TABLET    Take 1 tablet by mouth every 4 (four) hours as needed.  OXYCODONE-ACETAMINOPHEN (PERCOCET) 5-325 MG PER TABLET    Take 1 tablet by mouth every 4 (four) hours as needed.    POLYETHYLENE GLYCOL (MIRALAX) PACKET    Take 17 g by mouth daily.    TRAMADOL (ULTRAM) 50 MG TABLET    Take 1 tablet (50 mg total) by mouth every 4 (four) hours as needed.    TRAMADOL (ULTRAM) 50 MG TABLET    Take 1 tablet (50 mg total) by mouth every 6 (six) hours as needed.    VENLAFAXINE (EFFEXOR) 75 MG TABLET    Take 75 mg by mouth 2 (two) times daily.    VENLAFAXINE (EFFEXOR) 75 MG TABLET    Take 1 tablet (75 mg total) by mouth 2 (two) times daily.   Modified Medications    No medications on file   Discontinued Medications    No medications on file           Loetta Rough, DO  04/22/14 1459    Loetta Rough, DO  04/22/14 1502

## 2014-04-22 NOTE — ED Notes (Signed)
[facility] RN will call back

## 2014-04-22 NOTE — ED Notes (Signed)
Dr. Betti Cruz is bedside

## 2014-04-22 NOTE — Progress Notes (Signed)
Patient denies any discomfort. States "I feel better. Can I go home tomorrow?"

## 2014-04-22 NOTE — Consults (Addendum)
Madison Edward, MD  [facility] cardiology  Tel:  [phone]  Physician Line: [phone]      Reason for Consult: Asked to see patient because of bradycardia  Referring Physician: Dr Judi Saa      Assessment and Plan:    Patient is a 64 year old female with history of coronary artery disease status post coronary artery bypass grafting December 2015, nonhealing right lower extremity ulcer, hypertension, hyperlipidemia, chronic kidney disease, moderate AS, ICM LVEF 45% presents to the ED with complaints of fatigue, dizziness, found to be significantly bradycardic in junctional rhythm     Bradycardia: Sinus arrest/pause with junctional escape rhythm in the 30s.(Narrow QRS complex)  Likely secondary to high-dose beta blockers.  (Blood pressure stable at this point)   CAD  S/p prior PCI , recent  CABGx2 02/2014: Currently stable   Chronic diastolic heart failure: Not significantly decompensated   Acute on chronic renal insufficiency, likely in the setting of significant bradycardia   Diabetes with high blood sugars on admission   Poor healing ankle ulcer ( recent  Peripheral angio no sig obstructive dz and hence not intervened upon   Ischemic cardiomyopathy, LVEF of 40 percent: Was on beta blocker and hydralazine as an outpatient. Not on ACE/ARB due to CRI   Right-sided pneumonia with pleural effusion based on chest x-ray         Plan:      Hold beta blockers   Recommended IV glucagon and glucagon drip to reverse the effect of beta blockers   Close  monitoring in the ICU with transcutaneous pads in place and atropine at bedside   Patient will need attempt a pacemaker if she becomes unstable at any point   Monitor for the effect of beta blockers to wear off.  If persistent bradycardic 48-72 hours after beta blockers have been held.  Would benefit from permanent pacemaker   Hold antihypertensives at this point    D/w management with the pt and the referring physician - Dr  Judi Saa and Dr  [facility]          Cardiology Diagnostics     ECG:  I have personally reviewed the tracing from  02/05 .  In summary possible A fib with slow ventricular response @35 /min vs most likely sinus pauses with junctional escape rhythm    Repeat EKG shows sinus pause with junctional escape rhythm  at 33    Telemetry: Junctional escape rhythm (I have personally reviewed the telemetry strips)      Chest -Xray:  I have personally reviewed it  from 02/05  In summary:Right new pleural effusion. Underlying right  lower lobe consolidation most likely atelectasis of the right lower lobepneumonia could have this appear      Echocardiogram: ( I have personally reviewed the report)02/2014  LVEF 45%, Mod AS,     Problem list     Patient Active Problem List   Diagnosis   . NSTEMI (non-ST elevated myocardial infarction)   . Hypertensive urgency   . PAD (peripheral artery disease)   . Arterial leg ulcer   . Chronic obstructive pulmonary disease, unspecified COPD type   . Ischemic cardiomyopathy   . CAD (coronary artery disease)   . Diabetes   . Chronic kidney disease   . Debilitated   . Complete heart block       History of the Present Illness:    Patient is a 64 year old female with history of coronary artery disease status post coronary artery bypass grafting December  2015, nonhealing right lower extremity ulcer, hypertension, hyperlipidemia, chronic kidney disease, moderate AS, ICM LVEF 45% presents to the ED with complaints of fatigue, dizziness, found to be significantly bradycardic in junctional rhythm.    Patient had a visiting nurse who noted that her heart rate was low in the last 2 days and was asked to go to the ER. She has been feeling lightheaded and fatigued.  She attributed that to hydralazine and stopped it but continued to take carvedilol 25 mg twice a day which she took until this morning.      Currently at rest in junctional rhythm and no symptoms at rest. Denies any chest discomfort, orthopnea, PND  However, she complains  of worsening dyspnea on exertion which has gotten better since her surgery       Obtained /corroborted  history from pt and her daughter       Past  Medical History:     Past Medical History   Diagnosis Date   . Diabetes mellitus    . Chronic kidney disease    . Myocardial infarction    . Hypertension    . Hyperlipidemia    . Peripheral arterial disease      06/28/13 right superficial femoral artery stenosis, tibial peroneal trunk stenosis, left CVL a stenosis, treated with angiography and angioplasty.   . Coronary artery disease      Left circumflex artery presumed DES.,  Chronically occluded right coronary artery stent   . Anemia      She redid to chronic disease and iron deficiency.     . Arterial leg ulcer      2014, receiving home health   . Chronic obstructive pulmonary disease      Previously on Spiriva and Advair     Past Surgical History   Procedure Laterality Date   . Cardiac angiography and angioplasty  06/2013   . Colonoscopy  Unknown   . Tubal ligation     . Coronary artery bypass N/A 03/04/2014     Procedure: CORONARY ARTERY BYPASS;  Surgeon: Austin Miles, MD;  Location: [facility] HEART OR;  Service: Cardiovascular;  Laterality: N/A;  LIMA to LAD  SVG to OM   . Endoscopic,vein harvest Left 03/04/2014     Procedure: ENDOSCOPIC,VEIN HARVEST;  Surgeon: Austin Miles, MD;  Location: [facility] HEART OR;  Service: Cardiovascular;  Laterality: Left;  By D. Lazarus Gowda, RNFA. Left groin to mid-calf.   Rhae Hammock N/A 03/04/2014     Procedure: TEE;  Surgeon: Austin Miles, MD;  Location: [facility] HEART OR;  Service: Cardiovascular;  Laterality: N/A;  probe [id number]       Social History:     History     Social History   . Marital Status: Widowed     Spouse Name: N/A     Number of Children: N/A   . Years of Education: N/A     Social History Main Topics   . Smoking status: Former Smoker -- 1.00 packs/day for 20 years     Quit date: 02/29/2012   . Smokeless tobacco: None   . Alcohol Use: None   . Drug Use: None   . Sexual Activity:  None     Other Topics Concern   . None     Social History Narrative       Family History:     Family History   Problem Relation Age of Onset   . Coronary artery disease Mother      Died at 17  from MI.   Marland Kitchen Coronary artery disease Father      Died at 49 from MI   . Diabetes type II       Mother and father       Home medications:   Reviewed personally     Prior to Admission medications    Medication Sig Start Date End Date Taking? Authorizing Provider   acetaminophen (TYLENOL) 325 MG tablet Take 2 tablets (650 mg total) by mouth every 4 (four) hours as needed for Pain. 03/14/14   Jacelyn Pi, PA   amiodarone (PACERONE) 200 MG tablet Take 1 tablet (200 mg total) by mouth daily. 03/25/14   [name], MD   amLODIPine (NORVASC) 10 MG tablet Take 1 tablet (10 mg total) by mouth daily. 03/14/14   Jacelyn Pi, PA   amLODIPine (NORVASC) 10 MG tablet Take 1 tablet (10 mg total) by mouth daily. 03/25/14   [name], MD   aspirin 325 MG tablet Take 1 tablet (325 mg total) by mouth daily.  Patient taking differently: Take 81 mg by mouth daily.    03/14/14   Jacelyn Pi, PA   aspirin EC 325 MG EC tablet Take 1 tablet (325 mg total) by mouth daily. 03/25/14   [name], MD   atorvastatin (LIPITOR) 10 MG tablet Take 10 mg by mouth nightly.    [provider]   atorvastatin (LIPITOR) 10 MG tablet Take 1 tablet (10 mg total) by mouth nightly. 03/25/14   [name], MD   calcitRIOL (ROCALTROL) 0.25 MCG capsule Take 1 capsule (0.25 mcg total) by mouth daily. 03/25/14   [name], MD   carvedilol (COREG) 25 MG tablet Take 1 tablet (25 mg total) by mouth every 12 (twelve) hours. 03/14/14   Jacelyn Pi, PA   carvedilol (COREG) 25 MG tablet Take 1 tablet (25 mg total) by mouth every 12 (twelve) hours. 03/25/14   [name], MD   collagenase (SANTYL) ointment Apply topically daily. The affected wound area. 04/20/14   Johney Maine, DPM   furosemide (LASIX) 20 MG tablet Take 20 mg by  mouth daily.       [provider]   furosemide (LASIX) 20 MG tablet Take 1 tablet (20 mg total) by mouth daily. 03/25/14   [name], MD   gabapentin (NEURONTIN) 300 MG capsule Take 1 capsule (300 mg total) by mouth 2 (two) times daily. 03/25/14   [name], MD   hydrALAZINE (APRESOLINE) 100 MG tablet Take 1 tablet (100 mg total) by mouth 3 (three) times daily. 03/25/14   [name], MD   insulin aspart (NOVOLOG) 100 UNIT/ML injection Inject 1-4 Units into the skin 3 (three) times daily before meals as needed for High Blood Sugar. #90 insulin syringes and insulin needles 03/25/14   [name], MD   lidocaine (LIDODERM) 5 % Place 2 patches onto the skin daily as needed. Remove & Discard patch within 12 hours or as directed by MD 03/25/14   [name], MD   oxyCODONE-acetaminophen (PERCOCET) 5-325 MG per tablet Take 1 tablet by mouth every 4 (four) hours as needed. 03/14/14   Jacelyn Pi, PA   oxyCODONE-acetaminophen (PERCOCET) 5-325 MG per tablet Take 1 tablet by mouth every 4 (four) hours as needed. 03/25/14   [name], MD   polyethylene glycol [facility]) packet Take 17 g by mouth daily. 03/14/14   Venetia Constable  A, PA   traMADol (ULTRAM) 50 MG tablet Take 1 tablet (50 mg total) by mouth every 4 (four) hours as needed. 03/14/14   Jacelyn Pi, PA   traMADol (ULTRAM) 50 MG tablet Take 1 tablet (50 mg total) by mouth every 6 (six) hours as needed. 03/25/14   [name], MD   venlafaxine [facility]) 75 MG tablet Take 75 mg by mouth 2 (two) times daily.    [provider]   venlafaxine (EFFEXOR) 75 MG tablet Take 1 tablet (75 mg total) by mouth 2 (two) times daily. 03/25/14   [name], MD       Current Medications:   Reviewed personally      glucagon (rDNA) 2 mg Once         Allergies:     Allergies   Allergen Reactions   . Penicillins Anaphylaxis and Angioedema     Tolerates cephalosporins         Review of Symptoms:   Chronic nonhealing right LE ulcer   All other ROS  were reviewed and were  negative except as noted in HPI    Physical Examination:    Vitals reviewed:   BP 127/60 mmHg  Pulse 36  Temp(Src) 97.7 F (36.5 C)  Resp 18  Wt 75.1 kg (165 lb 9.1 oz)  SpO2 94%  No intake or output data in the 24 hours ending 04/22/14 1427  Constitutional :  no acute distress,   Eyes:  No Pallor or Icterus.  ENMT:   mucous membranes moist.. No Cyanosis  Neck: Mild  JVD.   normal thyroid gland  Respiratory:  Poor air movement and respiratory effort  bilaterally. No use of accessory muscles. Decreased BS right base .  Cardiovascular:   PMI  Not displaced. Regular 2/6 ESM at the base radiating across to the precordium. Nl S1 and S2.. . No carotid bruits.   No abdominal bruits heard . Dorsal pedis 1 left   2+ edema b/l   Extremities:  No Clubbing and cyanosis  Gastrointestinal Soft. Non-tender. Normoactive BS. No abdominal bruits  Skin: Warm. Dry, Right LE ulcer( bandaged)  Neurologic: Grossly intact.    Musculoskeletal:  No  Kyphosis or Scoliosis. Psychiatric: AAO X3.  Normal mood and effect.    Laboratory Studies:  (I have personally reviewed the labs below)      CBC w/Diff     Recent Labs  Lab 04/22/14  1409   WBC 4.94   HEMOGLOBIN 9.7*   HEMATOCRIT 30.5*   PLATELETS 222          Basic Metabolic Profile              Cardiac Enzymes            Thyroid Studies          Invalid input(s): FREET4        Lipid Profile            Coagulation Studies                   More than 45 of CC time spent in the evaluation and Mx of this patient    D/w Nursing Staff and Dr Wilhelmina Mcardle      Extensive review of old records from epic     Thank you for allowing [place] to participate in the care of this patient.  Will follow with you.      [name]  04/22/2014, 2:27 PM

## 2014-04-22 NOTE — H&P (Signed)
ADMISSION HISTORY AND PHYSICAL EXAM    Date Time: 04/22/2014 3:59 PM  Patient Name: [name]  Attending Physician:         History of Present Illness:   [name] is a 64 y.o. female who presents to the hospital with Chief Complaint   Patient presents with   . Bradycardia      The patient is a 64 year old female with history of coronary artery bypass  graft, moved here from [place], who presents to the emergency room with  complaints of dizziness and lethargy.  The patient noted to be bradycardiac  with heart rate in the 30s, external pacemaker placed.  The patient has  received IV glucagon.  The patient took her Coreg morning.  She is being  admitted to CCU for further treatment and evaluation.      Past Medical History:     Past Medical History   Diagnosis Date   . Diabetes mellitus    . Chronic kidney disease    . Myocardial infarction    . Hypertension    . Hyperlipidemia    . Peripheral arterial disease      06/28/13 right superficial femoral artery stenosis, tibial peroneal trunk stenosis, left CVL a stenosis, treated with angiography and angioplasty.   . Coronary artery disease      Left circumflex artery presumed DES.,  Chronically occluded right coronary artery stent   . Anemia      She redid to chronic disease and iron deficiency.     . Arterial leg ulcer      2014, receiving home health   . Chronic obstructive pulmonary disease      Previously on Spiriva and Advair       Past Surgical History:     Past Surgical History   Procedure Laterality Date   . Cardiac angiography and angioplasty  06/2013   . Colonoscopy  Unknown   . Tubal ligation     . Coronary artery bypass N/A 03/04/2014     Procedure: CORONARY ARTERY BYPASS;  Surgeon: Austin Miles, MD;  Location: [facility] HEART OR;  Service: Cardiovascular;  Laterality: N/A;  LIMA to LAD  SVG to OM   . Endoscopic,vein harvest Left 03/04/2014     Procedure: ENDOSCOPIC,VEIN HARVEST;  Surgeon: Austin Miles, MD;  Location: [facility] HEART OR;  Service:  Cardiovascular;  Laterality: Left;  By D. Lazarus Gowda, RNFA. Left groin to mid-calf.   Rhae Hammock N/A 03/04/2014     Procedure: TEE;  Surgeon: Austin Miles, MD;  Location: [facility] HEART OR;  Service: Cardiovascular;  Laterality: N/A;  probe [phone]       Family History:     Family History   Problem Relation Age of Onset   . Coronary artery disease Mother      Died at 69 from MI.   Marland Kitchen Coronary artery disease Father      Died at 42 from MI   . Diabetes type II       Mother and father       Social History:     History     Social History   . Marital Status: Widowed     Spouse Name: N/A     Number of Children: N/A   . Years of Education: N/A     Social History Main Topics   . Smoking status: Former Smoker -- 1.00 packs/day for 20 years     Quit date: 02/29/2012   . Smokeless tobacco: Not on  file   . Alcohol Use: Not on file   . Drug Use: Not on file   . Sexual Activity: Not on file     Other Topics Concern   . Not on file     Social History Narrative       Allergies:     Allergies   Allergen Reactions   . Penicillins Anaphylaxis and Angioedema     Tolerates cephalosporins        Medications:     (Not in a hospital admission)    Review of Systems:    A comprehensive review of systems was: General ROS: negative for - chills, fever or night sweats  Psychological ROS: negative for - anxiety, depression, disorientation, hallucinations or suicidal ideation  ENT ROS: negative for - headaches, nasal congestion, sinus pain or visual changes  Allergy and Immunology ROS: negative for - itchy/watery eyes, nasal congestion or postnasal drip  Hematological and Lymphatic ROS: negative for - bleeding problems, bruising, pallor or weight loss  Endocrine ROS: negative for - malaise/lethargy or polydipsia/polyuria  Respiratory ROS: negative for - cough, orthopnea, shortness of breath or wheezing  Cardiovascular ROS:  Pos for bradycardia ext pacemaker placed  Gastrointestinal ROS: negative for - abdominal pain, constipation, diarrhea or  nausea/vomiting  Genito-Urinary ROS: negative for - change in urinary stream, dysuria, hematuria or nocturia  Musculoskeletal ROS: negative for - joint pain, joint stiffness or joint swelling  Neurological ROS: negative for - confusion, dizziness, headaches, memory loss or speech problems  Dermatological ROS: negative for pruritus, rash and skin lesion changes      Physical Exam:     Filed Vitals:    04/22/14 1540   BP: 156/67   Pulse: 42   Temp:    Resp: 24   SpO2: 98%       Intake and Output Summary (Last 24 hours) at Date Time  No intake or output data in the 24 hours ending 04/22/14 1559     General appearance -  Elderly appearing  Mental status - alert, oriented to person, place, and time  Eyes - pupils equal and reactive, extraocular eye movements intact  Ears -  external ear canals normal  Nose - normal and patent, no erythema, discharge or polyps  Mouth - mucous membranes moist, pharynx normal without lesions  Neck - supple, no significant adenopathy  Lymphatics - no palpable lymphadenopathy, no hepatosplenomegaly  Chest -pos scar clear to auscultation, no wheezes, rales or rhonchi, symmetric air entry  Heart - normal rate, regular rhythm, normal S1, S2, no murmurs, rubs, clicks or gallops  Abdomen - soft, nontender, nondistended, no masses or organomegaly  Neurological - alert, oriented, normal speech, no focal findings or movement disorder noted  Musculoskeletal - no joint tenderness, deformity or swelling  Extremities - peripheral pulses normal, no pedal edema, no clubbing or cyanosis  Skin - normal coloration and turgor, no rashes, no suspicious skin lesions noted    Labs:       Recent Labs  Lab 04/22/14  1409   SODIUM 135*   POTASSIUM 5.2*   CHLORIDE 108   CO2 18*   BUN 91*   CREATININE 3.5*   CALCIUM 8.5   ALBUMIN 2.9*   PROTEIN, TOTAL 6.6   BILIRUBIN, TOTAL 0.3   ALKALINE PHOSPHATASE 170*   ALT 88*   AST (SGOT) 35*   GLUCOSE 228*       Recent Labs  Lab 04/22/14  1409   WBC 4.94  HEMOGLOBIN 9.7*    HEMATOCRIT 30.5*   PLATELETS 222           Rads:   Radiological Procedure reviewed.  Radiology Results (24 Hour)     Procedure Component Value Units Date/Time    Chest AP Portable [[id number]] Collected:  04/22/14 1420    Order Status:  Completed Updated:  04/22/14 1425    Narrative:      History: chest pain    Technique: Single Portable View    Comparison: 03/10/2014    Findings:  There is focal opacity at the right lung base most likely representing a  combination of a moderate right pleural effusion and associated passive  right lower lobe atelectasis.There is no pneumothorax.    There is marked cardiomegaly.     The mediastinum is within normal  limits.    Patient status post sternotomy      Impression:       Right new pleural effusion. Underlying right  lower lobe consolidation most likely atelectasis of the right lower lobe  pneumonia could have this appear    Laurena Slimmer, MD   04/22/2014 2:21 PM            Assessment:      The patient is a 64 year old female with a history of coronary disease,  hypertension, hyperlipidemia, heart failure with acute junctional  bradycardia.      Plan:      Her condition is critical.  She will be admitted to the CCU.  External  pacer pads have been placed.  The patient will receive IV glucagon  infusion.  We will hold Pacerone, and Norvasc.  We will continue aspirin.   Continue Lipitor.  We will discontinue Coreg.  We will resume Lasix,  perhaps tomorrow.  She will have p.r.n. hydralazine for blood pressure  control.  Cardiology  is going to consult tomorrow.  We will monitor the  patient clinically.        Signed by: Mechele Dawley, MD

## 2014-04-22 NOTE — Progress Notes (Signed)
Dr. Betti Cruz was updated of patient condition at this time. Patient's heart rate remain 28-35. SBP 130-140's. Patient continue to denies any discomfort.

## 2014-04-22 NOTE — Progress Notes (Signed)
Patient arrived from ED. In junctional bradycardia with PAC's. Heart rate 30-40's. BP 145/64. Patient alert and oriented x 4. Denies any discomfort at this time. Able to turn with minimal assistance in bed. Patient refuses to have right foot dressing removed. States dressing was changed by home health nurse today and has been evaluated by wound care nurse here at [facility]. Explain that an evaluation of foot needed to be done upon admission assessment but still refused. Right foot has an odor present. Palpable radial pulses and doppler pulses in left foot and right popliteal. Unable to check pulses in right foot due to ace wrap. Right foot pigmentation is very dark. Capillary refill >3 seconds.

## 2014-04-22 NOTE — ED Notes (Signed)
Low hear rate two days, fatigued

## 2014-04-23 DIAGNOSIS — I498 Other specified cardiac arrhythmias: Secondary | ICD-10-CM

## 2014-04-23 DIAGNOSIS — N179 Acute kidney failure, unspecified: Secondary | ICD-10-CM

## 2014-04-23 DIAGNOSIS — I429 Cardiomyopathy, unspecified: Secondary | ICD-10-CM

## 2014-04-23 DIAGNOSIS — E785 Hyperlipidemia, unspecified: Secondary | ICD-10-CM

## 2014-04-23 DIAGNOSIS — I509 Heart failure, unspecified: Secondary | ICD-10-CM

## 2014-04-23 LAB — BASIC METABOLIC PANEL
Anion Gap: 11 (ref 5.0–15.0)
BUN: 102 mg/dL — ABNORMAL HIGH (ref 7–19)
CO2: 17 mEq/L — ABNORMAL LOW (ref 22–29)
Calcium: 8 mg/dL — ABNORMAL LOW (ref 8.5–10.5)
Chloride: 106 mEq/L (ref 100–111)
Creatinine: 3.8 mg/dL — ABNORMAL HIGH (ref 0.6–1.0)
Glucose: 105 mg/dL — ABNORMAL HIGH (ref 70–100)
Potassium: 5.3 mEq/L — ABNORMAL HIGH (ref 3.5–5.1)
Sodium: 134 mEq/L — ABNORMAL LOW (ref 136–145)

## 2014-04-23 LAB — ECG 12-LEAD
Atrial Rate: 267 {beats}/min
Q-T Interval: 560 ms
QRS Duration: 86 ms
QTC Calculation (Bezet): 414 ms
R Axis: 23 degrees
T Axis: 119 degrees
Ventricular Rate: 33 {beats}/min

## 2014-04-23 LAB — GFR: EGFR: 14.4

## 2014-04-23 LAB — GLUCOSE WHOLE BLOOD - POCT
Whole Blood Glucose POCT: 117 mg/dL — ABNORMAL HIGH (ref 70–100)
Whole Blood Glucose POCT: 102 mg/dL — ABNORMAL HIGH (ref 70–100)
Whole Blood Glucose POCT: 190 mg/dL — ABNORMAL HIGH (ref 70–100)
Whole Blood Glucose POCT: 202 mg/dL — ABNORMAL HIGH (ref 70–100)

## 2014-04-23 MED ORDER — ATROPINE SULFATE 0.1 MG/ML IJ SOLN
INTRAMUSCULAR | Status: AC
Start: 2014-04-23 — End: 2014-04-23
  Filled 2014-04-23: qty 10

## 2014-04-23 MED ORDER — HYDRALAZINE HCL 20 MG/ML IJ SOLN
5.0000 mg | Freq: Once | INTRAMUSCULAR | Status: AC
Start: 2014-04-23 — End: 2014-04-23
  Administered 2014-04-23: 5 mg via INTRAVENOUS

## 2014-04-23 NOTE — Progress Notes (Signed)
Intake Interview for [employer]    Religious preference: yes; [employer] with: No      Permission granted to contact by the patient or POA (yes or no): no      Contact made with local religious body (specifically name the organization and who you spoke with):no

## 2014-04-23 NOTE — PT Eval Note (Signed)
[facility]  [address]  [place], [place] [address]  [phone]    Physical Therapy Evaluation    Patient: [name] MRN: [id number]   Unit: CCU CRITICAL CARE Bed: M602/M602-01    Time of Treatment: Time Calculation  PT Received On: 04/23/14  Start Time: 1339  Stop Time: 1349  Time Calculation (min): 10 min    Consult received for [name] for PT evaluation and treatment.  Patient's medical condition is appropriate for Physical Therapy  intervention at this time.    D/C Suggestions     Recommendation  Discharge Recommendation: Home with supervision;Home with home health PT;Acute Rehab  PT Frequency: 3-4x/wk    If Acute rehab recommended d/c disposition is not available, patient will need home with supervision and HHPT.    Assessment     [name] is a 64 y.o. female admitted 04/22/2014.  Pt's functional mobility is impaired due to the following deficits:  Decrease tol to activity/decreased HR, difficulty ambulating and gait impairments secondary to weakness and arterial ulcer R heel, impaired balance.  Pt would continue to benefit from PT to address these deficits and increase functional independence. Pt at this time unable to perform OOB activity, further assesment is needed.    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 female admitted on  04/22/2014 with  CAD s/p recent CABG, HTN, ICM, chronic systolic and diastolic HF, moderate AS, pw AKI and significant bradyarrhythmia     Patient Active Problem List   Diagnosis   . NSTEMI (non-ST elevated myocardial infarction)   . Hypertensive urgency   . PAD (peripheral artery disease)   . Arterial leg ulcer   . Chronic obstructive pulmonary disease, unspecified COPD type   . Ischemic cardiomyopathy   . CAD (coronary artery disease)   . Diabetes   . Chronic kidney disease   . Debilitated   . Complete heart block   . Junctional bradycardia   . Hyperkalemia     Past Medical History   Diagnosis Date   . Diabetes mellitus    . Chronic kidney disease    . Myocardial infarction    . Hypertension    . Hyperlipidemia    . Peripheral arterial disease      06/28/13 right superficial femoral artery stenosis, tibial peroneal trunk stenosis, left CVL a stenosis, treated with angiography and angioplasty.   . Coronary artery disease      Left circumflex artery presumed DES.,  Chronically occluded right coronary artery stent   . Anemia      She redid to chronic disease and iron deficiency.     . Arterial leg ulcer      2014, receiving home health   . Chronic obstructive pulmonary disease      Previously on Spiriva and Advair  Past Surgical History   Procedure Laterality Date   . Cardiac angiography and angioplasty  06/2013   . Colonoscopy  Unknown   . Tubal ligation     . Coronary artery bypass N/A 03/04/2014     Procedure: CORONARY ARTERY BYPASS;  Surgeon: Austin Miles, MD;  Location: [facility] HEART OR;  Service: Cardiovascular;  Laterality: N/A;  LIMA to LAD  SVG to OM   . Endoscopic,vein harvest Left 03/04/2014     Procedure: ENDOSCOPIC,VEIN HARVEST;  Surgeon: Austin Miles, MD;  Location: [facility] HEART OR;  Service: Cardiovascular;  Laterality: Left;  By D. Lazarus Gowda, RNFA. Left groin to mid-calf.   [name] N/A 03/04/2014     Procedure: TEE;  Surgeon: Austin Miles, MD;   Location: [facility] HEART OR;  Service: Cardiovascular;  Laterality: N/A;  probe [phone]       X-Rays/Tests/Labs:  Chest AP Portable [[id number]] (Order [id number])    Status: Final result         Study Result     History: chest pain  Technique: Single Portable View  Comparison: 03/10/2014  Findings:  There is focal opacity at the right lung base most likely representing a  combination of a moderate right pleural effusion and associated passive  right lower lobe atelectasis.There is no pneumothorax.  There is marked cardiomegaly. The mediastinum is within normal  limits.  Patient status post sternotomy  IMPRESSION:   Right new pleural effusion. Underlying right  lower lobe consolidation most likely atelectasis of the right lower lobe  pneumonia could have this appear  Laurena Slimmer, MD   04/22/2014 2:21 PM     Component Results     Component Value Ref Range & Units Status    Glucose 105 (H) 70 - 100 mg/dL Final    Interpretive Data for Adult Female and Female Population  Indeterminate Range: 100-125 mg/dL  Equal to or greater than 126 mg/dL meets the ADA  guidelines for Diabetes Mellitus diagnosis if symptoms  are present and confirmed by repeat testing.  Random (Non-Fasting)Interpretive Data (Adults):  Equal to or greater than 200 mg/dL meets the ADA  guidelines for Diabetes Mellitus diagnosis if symptoms  are present and confirmed by Fasting Glucose or GTT.      BUN 102 (H) 7 - 19 mg/dL Final    Creatinine 3.8 (H) 0.6 - 1.0 mg/dL Final    CALCIUM 8.0 (L) 8.5 - 10.5 mg/dL Final    Sodium 191 (L) 136 - 145 mEq/L Final    Potassium 5.3 (H) 3.5 - 5.1 mEq/L Final    Chloride 106 100 - 111 mEq/L Final    CO2 17 (L) 22 - 29 mEq/L Final    Anion Gap 11.0 5.0 - 15.0 Final      Lab and Collection          Social History:  Lives with daughter, son to help, in a house.  Entry Steps: 1 Rails: 0 Inside steps: 0  Rails: 0  Equipment at home: SPC, rolling walker  Prior Level of Function:     Cognition: intact    Mobility/Locomotion: house hold  ambulation, scooter for community distances   Feeding: indep   Grooming: indep   Bathing: indep   Dressing: indep   Toileting: indep    Subjective   Patient is agreeable to participation in the therapy session. Nursing clears patient for therapy.  Patient's Goal:  Return home  Pain: 0/10        Objective  Precautions/ Contraindications: falls, contact, sternal    Patient is in bed with  Intravenous (IV), Pulse Oximeter , Sequential Compression Device (SCD) and Blood Pressure Cuff in place.    Observation of patient/vitals:  Filed Vitals:    04/23/14 1200 04/23/14 1300 04/23/14 1400 04/23/14 1500   BP: 159/68 143/65 146/63 152/90   Pulse: 40 76 50 42   Temp: 98.4 F (36.9 C)      TempSrc: Oral      Resp: 21 18 24 17    Height:       Weight:       SpO2: 94% 95% 94% 93%       Orientation/Cognition:  Alert and Oriented x 4  Cognition: WFL    Musculoskeletal Examination:      ROM Strength   RUE WFL 4/5   LUE WFL 4/5   RLE Knee and hip WFL, ankle UTA secondary to ulcer/wrap, toe WFL 3+/5   LLE WFL 4/5     Sensation: intact to light touch      Functional Mobility:  UTA secondary to HR 30-40 BPM, OOB held    Balance:  OOB Held    Endurance: poor secondary to bradycardia    Participation:  good    Education:  Educated the patient to role of physical therapy, plan of care, goals  of therapy and safety with mobility and ADLs, home safety.    Patient is in bed with Intravenous (IV), Pulse Oximeter , Sequential Compression Device (SCD) and Blood Pressure Cuff, and call bell within reach. RN notified of session outcome.  Mobility status posted at bedside and within E.M.R.    Goals  Goal Formulation: With patient  Time for Goal Acheivement: By time of discharge  Goals: Select goal  Pt Will Go Supine To Sit: independent;to maximize functional mobility and independence  Pt Will Perform Sit to Stand: modified independent;to maximize functional mobility and independence  Pt Will Ambulate: 101-150 feet;with four wheel walker;with  supervision;to maximize functional mobility and independence  Pt Will Go Up / Down Stairs: 1-2 stairs;with supervision;With rail    Signature:  Bethanie Dicker, PT  04/23/2014  4:20 PM

## 2014-04-23 NOTE — Progress Notes (Signed)
Dr Betti Cruz was notified of elevated BP and previous treatment. Patient to receive extra dose of hydralazine. Monitor patient's tolerance and response to treatment.

## 2014-04-23 NOTE — Progress Notes (Signed)
[name] MRN: [id number]  64 y.o.  female    NUTRITION:  Reason for assessment: Pressure ulcer stage 3 or greater  Poor po intake secondary to suspected weakness and fatigue     Assessment   Subjective: Who is that ?    Past Medical History   Diagnosis Date   . Diabetes mellitus    . Chronic kidney disease    . Myocardial infarction    . Hypertension    . Hyperlipidemia    . Peripheral arterial disease      06/28/13 right superficial femoral artery stenosis, tibial peroneal trunk stenosis, left CVL a stenosis, treated with angiography and angioplasty.   . Coronary artery disease      Left circumflex artery presumed DES.,  Chronically occluded right coronary artery stent   . Anemia      She redid to chronic disease and iron deficiency.     . Arterial leg ulcer      2014, receiving home health   . Chronic obstructive pulmonary disease      Previously on Spiriva and Advair     Wt Readings from Last 30 Encounters:   04/22/14 79.9 kg (176 lb 2.4 oz)     History     Social History   . Marital Status: Widowed     Spouse Name: N/A     Number of Children: N/A   . Years of Education: N/A     Occupational History   . Not on file.     Social History Main Topics   . Smoking status: Former Smoker -- 1.00 packs/day for 20 years     Quit date: 02/29/2012   . Smokeless tobacco: Not on file   . Alcohol Use: Not on file   . Drug Use: Not on file   . Sexual Activity: Not on file     Other Topics Concern   . Not on file     Social History Narrative       Active Hospital Problems    Diagnosis   . Complete heart block   . Hyperkalemia     Allergies   Allergen Reactions   . Penicillins Anaphylaxis and Angioedema     Tolerates cephalosporins      GI Symptoms:  Poor appetite  Skin: Arterial leg ulcer    Current Meds:    aspirin EC 325 mg Daily   atorvastatin 10 mg QHS   atropine     calcitRIOL 0.25 mcg Daily   enoxaparin 30 mg Daily at 1800   gabapentin 300 mg BID   venlafaxine 75 mg BID     . sodium chloride 50 mL/hr (04/23/14 0001)        Recent Labs:    Recent Labs  Lab 04/22/14  1409   WBC 4.94   HEMOGLOBIN 9.7*   HEMATOCRIT 30.5*   MCV 86.6   PLATELETS 222       Recent Labs  Lab 04/23/14  1041 04/22/14  1936 04/22/14  1409   SODIUM 134* 135* 135*   POTASSIUM 5.3* 5.4* 5.2*   CHLORIDE 106 108 108   CO2 17* 19* 18*   BUN 102* 98* 91*   CREATININE 3.8* 3.6* 3.5*   GLUCOSE 105* 160* 228*   CALCIUM 8.0* 8.1* 8.5   EGFR 14.4 15.4 15.9       Recent Labs  Lab 04/22/14  1409   ALBUMIN 2.9*       Intake/Output Summary (Last 24 hours)  at 04/23/14 1130  Last data filed at 04/23/14 0800   Gross per 24 hour   Intake    120 ml   Output    300 ml   Net   -180 ml       Current Diet Order  Diet cardiac  25% po intake    Anthropometrics  Height: 154.9 cm (5\' 1" )  Weight: 79.9 kg (176 lb 2.4 oz)  Weight Change: 6.39  IBW/kg (Calculated) Female: 50.91 kg  IBW/kg (Calculated) Female: 47.73 kg  BMI (calculated): 33.4    Estimated Nutrition Needs:  Estimated Energy Needs  Total Energy Estimated Needs: 1400-1700 cal  Method for Estimating Needs: IBW x ( 24-28 ) cal    Estimated Protein Needs  Total Protein Estimated Needs: 48-60 g  Method for Estimating Needs: IBW x ( 0.8-1.0 ) g    Estimated Carbohydrate Needs  Total Carbohydrate Estimated Needs: 175-212 g  Method for Estimating Needs: 50% of calorie needs    Fluid Needs  Total Fluid Estimated Needs: 1800 ml  Method for Estimating Needs: IBW x 30 ml    Learning & Discharge Planning Needs: None  Religious/Cultural Food Practices: None    Nutrition Diagnosis:   Inadequate oral intake related to suspected weakness and fatigue as evidenced by 25% po intake    Intervention:  Recommend: supplement diet with 1 suplena TID  Consider 500 mg vitamin C and 220 mg zinc sulphate daily  Change diet to consistent carbohydrate renal rst    Goals:  Po intake increases to 50% or more to promote wound healing    M/E:  Monitor: po intake, wound healing  Follow 04/25/14    Lurline Hare MS. RD [address]  04/23/14 @ 1140

## 2014-04-23 NOTE — Progress Notes (Signed)
Severe Sepsis Screen    Date: 04/23/2014 Time: 4:52 AM  Nurse Signature: Alda Lea    Exclusions:      Patients meeting the following criteria are excluded from screening:     []  Suspicion or diagnosis of sepsis is documented and until 72 hours after antibiotics started or last regimen change:   - If Yes, Date of Documented Sepsis:                                          - If Yes, Date of last change in antibiotics:                                           []  Surgery - No screening for 24 hours after surgery   - If Yes, Date of Surgery:                                           []  Arctic Sun hypothermia protocol- Resume screening when arctic sun complete   []  Comfort Care Orders- Do not resume screening    Did you check any of the boxes above?     [x]  No, Continue to section A   []  Yes, Stop Here, Patient Excluded from Sepsis Screening. If screening should resume in the future, place "sticky note to treatment team" with date/time of when screening should resume. Communicate patient excluded from screening due to Comfort Care Orders using "sticky note to treatment team."    A. Infection:      Does your patient have ONE or more of the following infection criteria?     []  Documented Infection - Does the patient have positive culture results (from blood, sputum, urine, etc)?   [x]  Anti-Infective Therapy - Is the patient receiving antibiotic, antifungal, or other anti-infective therapy?   [x]  Pneumonia - Is there documentation of pneumonia (X Ray, etc)?   []  WBC's - Have WBC's been found in normally sterile fluid (urine, CSF, etc.)?   []  Perforated Viscus - Does the patient have a perforated hollow organ (bowel)?    A.  Did you check any of the boxes above?     []  No, Stop Here and Sepsis Screen Negative   [x]  Yes, continue to section B    B. SIRS:      Does your patient have TWO or more of the following SIRS criteria?     []  Temperature - Is the patient's temperature: Temp: 97.4 F (36.3 C) (04/23/14  0000)   - Greater than or equal to 38.3 degrees C (greater than 100.9 degrees F)?   - Less than or equal to 36 degrees C (less than or equal to 96.8 degrees F)?    []  Heart Rate: Heart Rate: (!) 37 (04/23/14 0300)   - Is the patient's heart rate greater than or equal to 90 bpm?     [x]  Respiratory: Resp Rate: 21 (04/23/14 0300)   - Is the patient's respiratory rate greater than 20?     []  WBC Count - Is the patient's WBC count:   Recent Labs  Lab 04/22/14  1409   WBC 4.94       -  Greater than or equal to 12,000/mm3 OR   - Less than or equal to 4,000/mm3 OR    - Are there greater than 10% immature neutrophils (bands)?     []  Glucose >140 without diabetes or on steroids?   Recent Labs  Lab 04/22/14  1936   GLUCOSE 160*         []  Significant edema is present?    B.  Did you check two or more of the boxes above?     [x]  No, Stop Here and Sepsis Screen Negative   []  Yes, contact Charge Nurse, continue to section C    C. ACUTE Organ Dysfunction:      Does your patient have ONE or more of the following organ dysfunction? (May need to wait for lab results for assessment - see below) Organ dysfunction must be a result of the sepsis NOT CHRONIC conditions.     []  Cardiovascular - Does the patient have a: BP: 187/80 mmHg (04/23/14 0300)   - Systolic Blood Pressure less than or equal to 90 mmHg OR   - Systolic Blood Pressure has dropped 40 mmHg or more from baseline OR   - Mean Arterial Pressure less than or equal to 70 mmHg (for at least one hour despite fluid resuscitation OR   - require vasopressor support?     []  Respiratory - Does the patient have new hypoxia defined by any of the following?   - A sustained increase in oxygen requirements by at least 2L/min on NC or 28% FiO2 within the last 24 hrs OR   - A persistent decrease in oxygen saturation of greater than or equal to 5% lasting at least four or more hours and occurring within the last 24 hours     []  Renal - Does the patient have:   - low urine output  (e.g. Less than 0.5 mL/kg/HR for one hour despite adequate fluid resuscitation OR   - Increased creatinine (greater than 50% increase from baseline) OR   - require acute dialysis?     []  Hematologic - Does the patient have:   - Low platelet count (less than 100,000 mm3)   Recent Labs  Lab 04/22/14  1409   PLATELETS 222    OR   - INR/aPTT greater than upper limit of normal?   Recent Labs  Lab 04/22/14  1409   PT INR 1.3*    OR        []  Metabolic - Does the patient have a high lactate (plasma lactate greater than or equal to 2.4 mMol/L?        []  Hepatic - Are the patient's liver enzymes elevated (ALT greater than 72 IU/L or Total Bilirubin greater than 2 MG/dL)?   Recent Labs  Lab 04/22/14  1409   BILIRUBIN, TOTAL 0.3   ALT 88*         []  CNS - Does the patient have altered consciousness or reduced Glasgow Coma Scale?     Other Active Diagnoses that may be contributing to signs of end organ dysfunction (Ex. Chronic kidney disease, cirrhosis):   - ________________________________________________________________      C.  Did you check any of the boxes above?     []  No, Sepsis Screen Negative   []  YES:  A) Infection + B) SIRS + C) Acute Organ Dysfunction = Positive Screen for Severe Sepsis. Notify attending (house officer during off-hours).     Notify Attending/House Technical sales engineer and document in Complex Assessment under provider  notification   - Name of physician notified:                                           - Date/Time Notifiied:                                             Document actions:   _0  Lactate drawn   _1  Blood Cultures obtained   _2  Antibiotics initiated or modified   _3   IV Fluid administered 0.9% NS __________ mLs given   Nursing Comments/Narrative:    - ______________________________________________________________     The Surviving Sepsis Guidelines recommend the following interventions to be completed within one hour of a positive sepsis screen.   Obtain new blood cultures prior to  antibiotic administration if not done within the last 24 hours.   Obtain lactate level, if initial lactate > 62mol, repeat lactate in 2 hours for goal decrease 10-20%; If there is not a decrease call [facility], hospital  If SBP < 90 or MAP < 65 or lactate greater than 4 mmol/dl; Start 0.9% NS IV Fluid Bolus of 30 mL/Kg (minimum) to maintain MAP > 65    Initiate vasopressors for hypotension not responding to fluid resuscitation (1st line Norepinephrine 1 - 300 mcg/min IV) - Patient must be in CCU if requires vasopressor   Initiate or escalate antibiotic therapy   Goal urine output greater than/equal to 0.5 mL/kg/hr

## 2014-04-23 NOTE — Progress Notes (Signed)
Patient given Kayexalate. Only able to drink half. Discussed purpose of medication and encouraged to drink. Still unable to drink.

## 2014-04-23 NOTE — Plan of Care (Signed)
Problem: Moderate/High Fall Risk Score >/=15  Goal: Patient will remain free of falls  Outcome: Progressing  Toileting with assist and vigilance for dizziness or feeling faint.  No problems noted.  Bed alarm on.    Problem: Hemodynamic Status: Cardiac  Goal: Stable vital signs and fluid balance  Outcome: Progressing  HR in 40s and irregular.  BP stable to hypertensive.  IV hydralazine x1 in evening.  Pt c/o fatigue, especially with exertion, and occasional SOB at rest.    Comments:   Alert, oriented.  HR 30-40s with BP stable to hypertensive.  IV hydralazine x1.  Occasional SOB at rest relieved with 1L O2 via NC, however off of O2 most of day.  Urinated x1 (missed the hat so unable to obtain measurement).  Good appetite.  SQ insulin x1 before dinner.  R ankle dressing changed and Dr. Wilhelmina Mcardle saw wound.  Wound care order entered.  CHG bath and oral care performed.  Draw sheet and gown changed.  Bed alarm on and bed in lowest position.

## 2014-04-23 NOTE — Plan of Care (Addendum)
Severe Sepsis Screen    Date: 04/23/2014 Time: 3:03 PM  Nurse Signature: Edd Arbour    Exclusions:      Patients meeting the following criteria are excluded from screening:     []  Suspicion or diagnosis of sepsis is documented and until 72 hours after antibiotics started or last regimen change:   - If Yes, Date of Documented Sepsis:                                          - If Yes, Date of last change in antibiotics:                                           []  Surgery - No screening for 24 hours after surgery   - If Yes, Date of Surgery:                                           []  Arctic Sun hypothermia protocol- Resume screening when arctic sun complete   []  Comfort Care Orders- Do not resume screening    Did you check any of the boxes above?     [x]  No, Continue to section A   []  Yes, Stop Here, Patient Excluded from Sepsis Screening. If screening should resume in the future, place "sticky note to treatment team" with date/time of when screening should resume. Communicate patient excluded from screening due to Comfort Care Orders using "sticky note to treatment team."    A. Infection:      Does your patient have ONE or more of the following infection criteria?     []  Documented Infection - Does the patient have positive culture results (from blood, sputum, urine, etc)?   []  Anti-Infective Therapy - Is the patient receiving antibiotic, antifungal, or other anti-infective therapy?   []  Pneumonia - Is there documentation of pneumonia (X Ray, etc)?   []  WBC's - Have WBC's been found in normally sterile fluid (urine, CSF, etc.)?   []  Perforated Viscus - Does the patient have a perforated hollow organ (bowel)?    A.  Did you check any of the boxes above?     [x]  No, Stop Here and Sepsis Screen Negative   []  Yes, continue to section B    B. SIRS:      Does your patient have TWO or more of the following SIRS criteria?     []  Temperature - Is the patient's temperature: Temp: 98.4 F (36.9 C) (04/23/14  1200)   - Greater than or equal to 38.3 degrees C (greater than 100.9 degrees F)?   - Less than or equal to 36 degrees C (less than or equal to 96.8 degrees F)?    []  Heart Rate: Heart Rate: (!) 42 (04/23/14 1500)   - Is the patient's heart rate greater than or equal to 90 bpm?     []  Respiratory: Resp Rate: 17 (04/23/14 1500)   - Is the patient's respiratory rate greater than 20?     []  WBC Count - Is the patient's WBC count:   Recent Labs  Lab 04/22/14  1409   WBC 4.94       -  Greater than or equal to 12,000/mm3 OR   - Less than or equal to 4,000/mm3 OR    - Are there greater than 10% immature neutrophils (bands)?     []  Glucose >140 without diabetes or on steroids?   Recent Labs  Lab 04/23/14  1041   GLUCOSE 105*         []  Significant edema is present?    B.  Did you check two or more of the boxes above?     [x]  No, Stop Here and Sepsis Screen Negative   []  Yes, contact Charge Nurse, continue to section C    C. ACUTE Organ Dysfunction:      Does your patient have ONE or more of the following organ dysfunction? (May need to wait for lab results for assessment - see below) Organ dysfunction must be a result of the sepsis NOT CHRONIC conditions.     []  Cardiovascular - Does the patient have a: BP: 152/90 mmHg (04/23/14 1500)   - Systolic Blood Pressure less than or equal to 90 mmHg OR   - Systolic Blood Pressure has dropped 40 mmHg or more from baseline OR   - Mean Arterial Pressure less than or equal to 70 mmHg (for at least one hour despite fluid resuscitation OR   - require vasopressor support?     []  Respiratory - Does the patient have new hypoxia defined by any of the following?   - A sustained increase in oxygen requirements by at least 2L/min on NC or 28% FiO2 within the last 24 hrs OR   - A persistent decrease in oxygen saturation of greater than or equal to 5% lasting at least four or more hours and occurring within the last 24 hours     []  Renal - Does the patient have:   - low urine output (e.g.  Less than 0.5 mL/kg/HR for one hour despite adequate fluid resuscitation OR   - Increased creatinine (greater than 50% increase from baseline) OR   - require acute dialysis?     []  Hematologic - Does the patient have:   - Low platelet count (less than 100,000 mm3)   Recent Labs  Lab 04/22/14  1409   PLATELETS 222    OR   - INR/aPTT greater than upper limit of normal?   Recent Labs  Lab 04/22/14  1409   PT INR 1.3*    OR        []  Metabolic - Does the patient have a high lactate (plasma lactate greater than or equal to 2.4 mMol/L?        []  Hepatic - Are the patient's liver enzymes elevated (ALT greater than 72 IU/L or Total Bilirubin greater than 2 MG/dL)?   Recent Labs  Lab 04/22/14  1409   BILIRUBIN, TOTAL 0.3   ALT 88*         []  CNS - Does the patient have altered consciousness or reduced Glasgow Coma Scale?     Other Active Diagnoses that may be contributing to signs of end organ dysfunction (Ex. Chronic kidney disease, cirrhosis):   - ________________________________________________________________      C.  Did you check any of the boxes above?     []  No, Sepsis Screen Negative   []  YES:  A) Infection + B) SIRS + C) Acute Organ Dysfunction = Positive Screen for Severe Sepsis. Notify attending (house officer during off-hours).     Notify Attending/House Technical sales engineer and document in Complex Assessment under provider  notification   - Name of physician notified:                                           - Date/Time Notifiied:                                             Document actions:   _0  Lactate drawn   _1  Blood Cultures obtained   _2  Antibiotics initiated or modified   _3   IV Fluid administered 0.9% NS __________ mLs given   Nursing Comments/Narrative:    - ______________________________________________________________     The Surviving Sepsis Guidelines recommend the following interventions to be completed within one hour of a positive sepsis screen.   Obtain new blood cultures prior to antibiotic  administration if not done within the last 24 hours.   Obtain lactate level, if initial lactate > 65mol, repeat lactate in 2 hours for goal decrease 10-20%; If there is not a decrease call [facility], hospital  If SBP < 90 or MAP < 65 or lactate greater than 4 mmol/dl; Start 0.9% NS IV Fluid Bolus of 30 mL/Kg (minimum) to maintain MAP > 65    Initiate vasopressors for hypotension not responding to fluid resuscitation (1st line Norepinephrine 1 - 300 mcg/min IV) - Patient must be in CCU if requires vasopressor   Initiate or escalate antibiotic therapy   Goal urine output greater than/equal to 0.5 mL/kg/hr

## 2014-04-23 NOTE — Patient Care Conference (Signed)
Rounds performed with Dr. Osa Craver, Fredrik Cove (charge RN), Okey Dupre (RT), and CCU RNs.

## 2014-04-23 NOTE — Plan of Care (Signed)
Problem: Safety  Goal: Patient will be free from injury during hospitalization  Outcome: Progressing  Bed placed to the lowest position. Call light within reach. Non skid socks on left leg only. Round per protocol.     Problem: Pain  Goal: Patient's pain/discomfort is manageable  Outcome: Progressing  Complain of pain in right leg only with movement. Leg elevated on pillow.     Problem: Tissue integrity  Goal: Damaged tissue is healing and protected  Outcome: Progressing  Ulcer measuring 10.5 X 3.5 cm on the right lower foot (inner ankle area). Wound culture sent. Cleansed with saline and covered with wet to dry dressing and ace wrap.   Patient is able to reposition herself.     Problem: Hemodynamic Status: Cardiac  Goal: Stable vital signs and fluid balance  Outcome: Progressing  Running junctional with occasional PAC'S, PVC's and PJC's on cardiac monitor.  HR rate running in between 30's and lower 40's. See flow sheet for vitals signs. NO complain of chest pain. No SOB. Patient received hydralazine for high BP.   Patient has not voided this shift. Bladder scan completed. Dr Wilhelmina Mcardle notified about low output. 1/2 NS initiated. Will monitor resp. And cardiac status.Monitor I/O's and labs.   Monitor how patient tolerates activities.

## 2014-04-23 NOTE — Progress Notes (Signed)
PROGRESS NOTE    Date Time: 04/23/2014 9:22 PM  Patient Name: [name], [name]      Subjective:    The patient is a 64 year old female admitted with bradycardia.        Review of Systems:   A comprehensive review of systems was: General ROS: negative for - chills, fever or night sweats  ENT ROS: negative for - headaches, nasal congestion, sinus pain or visual changes  Respiratory ROS: negative for - cough, orthopnea, shortness of breath or wheezing  Cardiovascular ROS: negative for - chest pain, dyspnea on exertion, orthopnea or shortness of breath  Gastrointestinal ROS: negative for - abdominal pain, constipation, diarrhea or nausea/vomiting  Genito-Urinary ROS: negative for - change in urinary stream, dysuria, hematuria or nocturia  Musculoskeletal ROS: negative for - joint pain, joint stiffness or joint swelling  Neurological ROS: negative for - confusion, dizziness, headaches, memory loss or speech problems    Physical Exam:     Filed Vitals:    04/23/14 2000   BP: 160/70   Pulse: 46   Temp: 97.4 F (36.3 C)   Resp: 27   SpO2: 92%       General appearance - alert, well appearing, and in no distress  Mental status - alert, oriented to person, place, and time  Eyes - pupils equal and reactive, extraocular eye movements intact  Nose - normal and patent, no erythema, discharge or polyps  Mouth - mucous membranes moist, pharynx normal without lesions  Neck - supple, no significant adenopathy  Lymphatics - no palpable lymphadenopathy, no hepatosplenomegaly  Chest - clear to auscultation, no wheezes, rales or rhonchi, symmetric air entry  Heart - normal rate, regular rhythm, normal S1, S2, no murmurs, rubs, clicks or gallops  Abdomen - soft, nontender, nondistended, no masses or organomegaly  Neurological - alert, oriented, normal speech, no focal findings or movement disorder noted  Musculoskeletal - no joint tenderness, deformity or swelling  Extremities - peripheral pulses normal, no pedal edema, no clubbing or  cyanosis  Skin -  Pos wound base clean     Medications:     Current Facility-Administered Medications   Medication Dose Route Frequency   . aspirin EC  325 mg Oral Daily   . atorvastatin  10 mg Oral QHS   . calcitRIOL  0.25 mcg Oral Daily   . enoxaparin  30 mg Subcutaneous Daily at 1800   . gabapentin  300 mg Oral BID   . venlafaxine  75 mg Oral BID       Intake and Output Summary (Last 24 hours) at Date Time    Intake/Output Summary (Last 24 hours) at 04/23/14 2122  Last data filed at 04/23/14 1900   Gross per 24 hour   Intake 1069.17 ml   Output    500 ml   Net 569.17 ml           Labs:     Recent Labs  Lab 04/23/14  1041 04/22/14  1936 04/22/14  1409   SODIUM 134* 135* 135*   POTASSIUM 5.3* 5.4* 5.2*   CHLORIDE 106 108 108   CO2 17* 19* 18*   BUN 102* 98* 91*   CREATININE 3.8* 3.6* 3.5*   CALCIUM 8.0* 8.1* 8.5   ALBUMIN  --   --  2.9*   PROTEIN, TOTAL  --   --  6.6   BILIRUBIN, TOTAL  --   --  0.3   ALKALINE PHOSPHATASE  --   --  170*   ALT  --   --  88*   AST (SGOT)  --   --  35*   GLUCOSE 105* 160* 228*       Recent Labs  Lab 04/22/14  1409   WBC 4.94   HEMOGLOBIN 9.7*   HEMATOCRIT 30.5*   PLATELETS 222               Rads:     Radiology Results (24 Hour)     ** No results found for the last 24 hours. **            Assessment:      The patient is a 64 year old female admitted with bradycardia, chronic  kidney disease, acute kidney injury, heart failure, cardiomyopathy,  diabetes, and history of chronic wound.  Her condition is guarded.      Plan:      Plan is to continue inpatient hospitalization and monitor the patient's  rhythm.  Beta blockers on hold.  We will give gentle intravenous fluid  hydration.  We will obtain a hematology evaluation.  We will continue local  wound care to lower extremity and await ostomy nurse evaluation.  I have  inspected the wound with the nurse present.  Continue aspirin 325 mg daily,  Lipitor 10 mg daily, Lovenox 30 mg daily.  Will monitor the patient  clinically.      Signed by:  Mechele Dawley, MD  04/23/2014  9:22 PM

## 2014-04-23 NOTE — Progress Notes (Signed)
[facility]  PROGRESS NOTE      Date Time: 04/23/2014 10:00 AM  Patient Name: [name]    Signed by: Leeanne Mannan Erilyn Pearman    [facility])  [id number]  MD LINE [phone]    Assessment:   15 F with CAD s/p recent CABG, HTN, ICM, chronic systolic and diastolic HF, moderate AS, pw AKI and significant bradyarrhythmia.     Plan:   1. Bradyarrhythmia.  Profound bradyarrhythmia with sinus pauses.  Likely secondary to coreg.  Appears to be more in a junctional rhythm at this time.  No indication for TVP at this time but cont to monitor closely in CCU.  Need for PPM to be determined.   2. Hyperkalemia.  Repeat stat BMP as this will worsen her arrhythmia.   3. AKI.  Acute on chronic.  Likely secondary to her bradyarrhythmia.  Follow closely and consider renal consult if no improvement.  Hold lasix.   4. CHF.  Chronic systolic and diastolic HF.  Not acutely decompensated.  Hold lasix.   5. CAD.  Recent CABG.  No chest pain.  6. Cardiomyopathy.  Likely ischemic.  Holding BB due to above.  Previously on hydralazine and not ACEI/ARB due to CKD.  Holding meds due to above.     Patient Active Problem List   Diagnosis   . NSTEMI (non-ST elevated myocardial infarction)   . Hypertensive urgency   . PAD (peripheral artery disease)   . Arterial leg ulcer   . Chronic obstructive pulmonary disease, unspecified COPD type   . Ischemic cardiomyopathy   . CAD (coronary artery disease)   . Diabetes   . Chronic kidney disease   . Debilitated   . Complete heart block   . Junctional bradycardia   . Hyperkalemia       Subjective:   Denies chest pain, SOB or palpitations.    Medications:   Medications reviewed    Current Medications   Current Facility-Administered Medications   Medication Dose Route Frequency   . aspirin EC  325 mg Oral Daily   . atorvastatin  10 mg Oral QHS   . atropine       . calcitRIOL  0.25 mcg Oral Daily   . enoxaparin  30 mg Subcutaneous Daily at 1800   . furosemide  20 mg Oral Daily   .  gabapentin  300 mg Oral BID   . venlafaxine  75 mg Oral BID              Physical Exam:     Filed Vitals:    04/23/14 0900   BP: 165/69   Pulse: 37   Temp:    Resp: 25   SpO2: 95%     Temp (24hrs), Avg:97.5 F (36.4 C), Min:97.2 F (36.2 C), Max:97.7 F (36.5 C)    Intake and Output Summary (Last 24 hours) at Date Time    Intake/Output Summary (Last 24 hours) at 04/23/14 1000  Last data filed at 04/23/14 0800   Gross per 24 hour   Intake    120 ml   Output    300 ml   Net   -180 ml     Wt Readings from Last 3 Encounters:   04/22/14 79.9 kg (176 lb 2.4 oz)     Vital signs reviewed    Telemetry reviewed: junctional rhythm    General Appearance:  Breathing comfortable, no acute distress  Head:  normocephalic  Eyes:  EOM's intact, nonicteric  sclera  Neck:  No carotid bruit or jugular venous distension  Lungs:  Clear to auscultation throughout, no wheezes, rhonchi or rales, good respiratory effort   Cardiac:  RRR, normal S1, S2, no S3, no S4, no rub, no murmurs   Abdomen:  Soft, non-tender, positive bowel sounds  Extremities:  No cyanosis or clubbing.  No LE edema  Vascular: 2+ radial and distal pulses bilaterally  Neurologic:  Alert and oriented x3, mood and affect normal    Labs:   Labs reviewed    CBC w/Diff     Recent Labs  Lab 04/22/14  1409   WBC 4.94   HEMOGLOBIN 9.7*   HEMATOCRIT 30.5*   PLATELETS 222          Basic Metabolic Profile     Recent Labs  Lab 04/22/14  1936 04/22/14  1409   SODIUM 135* 135*   POTASSIUM 5.4* 5.2*   CHLORIDE 108 108   CO2 19* 18*   BUN 98* 91*   CREATININE 3.6* 3.5*   EGFR 15.4 15.9   GLUCOSE 160* 228*   CALCIUM 8.1* 8.5            Cardiac Enzymes     Recent Labs  Lab 04/22/14  1407   TROPONIN I 0.03          Thyroid Studies         Invalid input(s): FREET4       Cholesterol Panel            Coagulation Studies     Recent Labs  Lab 04/22/14  1409   PT 16.2*   PT INR 1.3*              Imaging:     I spent 35 min of critical care time in the management of this patient.

## 2014-04-23 NOTE — Plan of Care (Signed)
Problem: Health Promotion  Goal: Vaccination Screening  All patients will be screened for current vaccination status on each admission.   Outcome: Completed Date Met:  04/23/14  Patient refused flu vaccine. Patient states that she gets sick form the vaccine.  Goal: Risk control - tobacco abuse  Actions to eliminate or reduce tobacco use.   Outcome: Completed Date Met:  04/23/14  Former smoker. NO smoking cessation required.

## 2014-04-23 NOTE — Progress Notes (Signed)
Dimmed light. Received percocet at [employer]. Verbalized pain level going down to 5.

## 2014-04-24 LAB — ECG 12-LEAD
Atrial Rate: 15 {beats}/min
Q-T Interval: 422 ms
QRS Duration: 90 ms
QTC Calculation (Bezet): 407 ms
R Axis: 43 degrees
T Axis: 169 degrees
Ventricular Rate: 56 {beats}/min

## 2014-04-24 LAB — BASIC METABOLIC PANEL
Anion Gap: 8 (ref 5.0–15.0)
BUN: 96 mg/dL — ABNORMAL HIGH (ref 7–19)
CO2: 18 mEq/L — ABNORMAL LOW (ref 22–29)
Calcium: 8.2 mg/dL — ABNORMAL LOW (ref 8.5–10.5)
Chloride: 107 mEq/L (ref 100–111)
Creatinine: 3.7 mg/dL — ABNORMAL HIGH (ref 0.6–1.0)
Glucose: 133 mg/dL — ABNORMAL HIGH (ref 70–100)
Potassium: 4.9 mEq/L (ref 3.5–5.1)
Sodium: 133 mEq/L — ABNORMAL LOW (ref 136–145)

## 2014-04-24 LAB — GLUCOSE WHOLE BLOOD - POCT
Whole Blood Glucose POCT: 104 mg/dL — ABNORMAL HIGH (ref 70–100)
Whole Blood Glucose POCT: 148 mg/dL — ABNORMAL HIGH (ref 70–100)
Whole Blood Glucose POCT: 159 mg/dL — ABNORMAL HIGH (ref 70–100)
Whole Blood Glucose POCT: 155 mg/dL — ABNORMAL HIGH (ref 70–100)

## 2014-04-24 LAB — GFR: EGFR: 14.9

## 2014-04-24 LAB — MAGNESIUM: Magnesium: 2.6 mg/dL (ref 1.6–2.6)

## 2014-04-24 LAB — TSH: TSH: 5.74 u[IU]/mL — ABNORMAL HIGH (ref 0.35–4.94)

## 2014-04-24 MED ORDER — DOCUSATE SODIUM 100 MG PO CAPS
100.0000 mg | ORAL_CAPSULE | Freq: Every day | ORAL | Status: DC
Start: 2014-04-24 — End: 2014-04-28
  Administered 2014-04-24 – 2014-04-28 (×5): 100 mg via ORAL
  Filled 2014-04-24 (×5): qty 1

## 2014-04-24 MED ORDER — HYDRALAZINE HCL 25 MG PO TABS
25.0000 mg | ORAL_TABLET | Freq: Three times a day (TID) | ORAL | Status: DC
Start: 2014-04-24 — End: 2014-04-25
  Administered 2014-04-24 – 2014-04-25 (×3): 25 mg via ORAL
  Filled 2014-04-24 (×3): qty 1

## 2014-04-24 MED ORDER — HEPARIN SODIUM (PORCINE) 5000 UNIT/ML IJ SOLN
5000.0000 [IU] | Freq: Two times a day (BID) | INTRAMUSCULAR | Status: DC
Start: 2014-04-24 — End: 2014-04-28
  Administered 2014-04-24 – 2014-04-28 (×7): 5000 [IU] via SUBCUTANEOUS
  Filled 2014-04-24 (×7): qty 1

## 2014-04-24 NOTE — Plan of Care (Signed)
Problem: Hemodynamic Status: Cardiac  Goal: Stable vital signs and fluid balance  Outcome: Progressing  On cardiac monitor running junctional with occasional sinus beats. Heart rate in 40's. Patient tolerated getting out of bed and using commode. BP within parameter. No complain of chest pain. No s/s of resp. Distress.     Comments:   1/2 NS continued. Dressing on the right foot is clean an intact. Foot elevated on pillow. Will continue to monitor HR, lab and BP.

## 2014-04-24 NOTE — Plan of Care (Signed)
Problem: Safety  Goal: Patient will be free from injury during hospitalization  Outcome: Progressing  Bed placed to the lowest position. Call light within reach. Non skid socks on. Demonstrate use of call light. Rounded per protocol.

## 2014-04-24 NOTE — Progress Notes (Signed)
Severe Sepsis Screen    Date: 04/24/2014 Time: 8:41 AM  Nurse Signature: Shalay Carder L Da-Silveira    Exclusions:      Patients meeting the following criteria are excluded from screening:     []  Suspicion or diagnosis of sepsis is documented and until 72 hours after antibiotics started or last regimen change:   - If Yes, Date of Documented Sepsis:                                          - If Yes, Date of last change in antibiotics:                                           []  Surgery - No screening for 24 hours after surgery   - If Yes, Date of Surgery:                                           []  Arctic Sun hypothermia protocol- Resume screening when arctic sun complete   []  Comfort Care Orders- Do not resume screening    Did you check any of the boxes above?     [x]  No, Continue to section A   []  Yes, Stop Here, Patient Excluded from Sepsis Screening. If screening should resume in the future, place "sticky note to treatment team" with date/time of when screening should resume. Communicate patient excluded from screening due to Comfort Care Orders using "sticky note to treatment team."    A. Infection:      Does your patient have ONE or more of the following infection criteria?     []  Documented Infection - Does the patient have positive culture results (from blood, sputum, urine, etc)?   []  Anti-Infective Therapy - Is the patient receiving antibiotic, antifungal, or other anti-infective therapy?   []  Pneumonia - Is there documentation of pneumonia (X Ray, etc)?   []  WBC's - Have WBC's been found in normally sterile fluid (urine, CSF, etc.)?   []  Perforated Viscus - Does the patient have a perforated hollow organ (bowel)?    A.  Did you check any of the boxes above?     [x]  No, Stop Here and Sepsis Screen Negative   []  Yes, continue to section B    B. SIRS:      Does your patient have TWO or more of the following SIRS criteria?     []  Temperature - Is the patient's temperature: Temp: 97.3 F (36.3 C) (04/24/14  0800)   - Greater than or equal to 38.3 degrees C (greater than 100.9 degrees F)?   - Less than or equal to 36 degrees C (less than or equal to 96.8 degrees F)?    []  Heart Rate: Heart Rate: (!) 47 (04/24/14 0800)   - Is the patient's heart rate greater than or equal to 90 bpm?     []  Respiratory: Resp Rate: 18 (04/24/14 0800)   - Is the patient's respiratory rate greater than 20?     []  WBC Count - Is the patient's WBC count:   Recent Labs  Lab 04/22/14  1409   WBC 4.94       -  Greater than or equal to 12,000/mm3 OR   - Less than or equal to 4,000/mm3 OR    - Are there greater than 10% immature neutrophils (bands)?     []  Glucose >140 without diabetes or on steroids?   Recent Labs  Lab 04/24/14  0332   GLUCOSE 133*         []  Significant edema is present?    B.  Did you check two or more of the boxes above?     []  No, Stop Here and Sepsis Screen Negative   []  Yes, contact Charge Nurse, continue to section C    C. ACUTE Organ Dysfunction:      Does your patient have ONE or more of the following organ dysfunction? (May need to wait for lab results for assessment - see below) Organ dysfunction must be a result of the sepsis NOT CHRONIC conditions.     []  Cardiovascular - Does the patient have a: BP: 165/77 mmHg (04/24/14 0800)   - Systolic Blood Pressure less than or equal to 90 mmHg OR   - Systolic Blood Pressure has dropped 40 mmHg or more from baseline OR   - Mean Arterial Pressure less than or equal to 70 mmHg (for at least one hour despite fluid resuscitation OR   - require vasopressor support?     []  Respiratory - Does the patient have new hypoxia defined by any of the following?   - A sustained increase in oxygen requirements by at least 2L/min on NC or 28% FiO2 within the last 24 hrs OR   - A persistent decrease in oxygen saturation of greater than or equal to 5% lasting at least four or more hours and occurring within the last 24 hours     []  Renal - Does the patient have:   - low urine output (e.g.  Less than 0.5 mL/kg/HR for one hour despite adequate fluid resuscitation OR   - Increased creatinine (greater than 50% increase from baseline) OR   - require acute dialysis?     []  Hematologic - Does the patient have:   - Low platelet count (less than 100,000 mm3)   Recent Labs  Lab 04/22/14  1409   PLATELETS 222    OR   - INR/aPTT greater than upper limit of normal?   Recent Labs  Lab 04/22/14  1409   PT INR 1.3*    OR        []  Metabolic - Does the patient have a high lactate (plasma lactate greater than or equal to 2.4 mMol/L?        []  Hepatic - Are the patient's liver enzymes elevated (ALT greater than 72 IU/L or Total Bilirubin greater than 2 MG/dL)?   Recent Labs  Lab 04/22/14  1409   BILIRUBIN, TOTAL 0.3   ALT 88*         []  CNS - Does the patient have altered consciousness or reduced Glasgow Coma Scale?     Other Active Diagnoses that may be contributing to signs of end organ dysfunction (Ex. Chronic kidney disease, cirrhosis):   - ________________________________________________________________      C.  Did you check any of the boxes above?     []  No, Sepsis Screen Negative   []  YES:  A) Infection + B) SIRS + C) Acute Organ Dysfunction = Positive Screen for Severe Sepsis. Notify attending (house officer during off-hours).     Notify Attending/House Technical sales engineer and document in Complex Assessment under provider  notification   - Name of physician notified:                                           - Date/Time Notifiied:                                             Document actions:   _0  Lactate drawn   _1  Blood Cultures obtained   _2  Antibiotics initiated or modified   _3   IV Fluid administered 0.9% NS __________ mLs given   Nursing Comments/Narrative:    - ______________________________________________________________     The Surviving Sepsis Guidelines recommend the following interventions to be completed within one hour of a positive sepsis screen.   Obtain new blood cultures prior to antibiotic  administration if not done within the last 24 hours.   Obtain lactate level, if initial lactate > 65mol, repeat lactate in 2 hours for goal decrease 10-20%; If there is not a decrease call [facility], hospital  If SBP < 90 or MAP < 65 or lactate greater than 4 mmol/dl; Start 0.9% NS IV Fluid Bolus of 30 mL/Kg (minimum) to maintain MAP > 65    Initiate vasopressors for hypotension not responding to fluid resuscitation (1st line Norepinephrine 1 - 300 mcg/min IV) - Patient must be in CCU if requires vasopressor   Initiate or escalate antibiotic therapy   Goal urine output greater than/equal to 0.5 mL/kg/hr

## 2014-04-24 NOTE — Progress Notes (Signed)
Multidisciplinary round done this A.M. With:  Dr: Cuiper  Charge Nurse: Roger  Pharmacist:  Dietician:  Case Manager:

## 2014-04-24 NOTE — Consults (Signed)
CONSULTATION    Date Time: 04/24/2014 6:13 PM  Patient Name: [name], [name]  Requesting Physician: Mechele Dawley, MD      Reason for Consultation:   Acute kidney injury on chronic kidney disease.    Assessment:   Acute kidney injury, likely related to hemodynamic, hypoperfusion due to cardiac etiology.   Chronic kidney disease, likely related to hypertension, vascular disease, and diabetes.  Baseline is unclear.   Hypertension, uncontrolled.  Hyperphosphatemia,   Sec hyperparathyroidism.  Anemia, likely of chronic disease. Also with low iron.  Bradyarrhythmia, Cardiology is following.  Coronary artery disease. S/p recent CABG.  Chronic systolic and diastolic HF  Peripheral vascular disease.  Plan:   Check U/A, urinelytes and also renal US. Not available in the system.  On gentle IV hydration. Closely monitor the fluid status.  Check phosphorus and phosphorus. On Calcitriol.  Monitor the blood pressure. Control as per cardiology.  Monitor the H&h for now.  Meds in renal dose and avoid nephrotoxic.  Strict I&O.  D/W primary team.  History:   [name] is a 64 y.o. female who presents to the hospital on 04/22/2014 with bradyarrhythmia. Pt has recently been admitted to the hospital for ischemic cardiac disease and was also evaluated for renal disease. She received glucagon  And was admitted with external pacemaker. Pt is being followed by cardiology. She is s/p recent CABG.  On admission, pt is also found to have elevation of BUN and Cr. She was evaluated on last admission. Pt seems to have baseline renal disease with unknown Cr. As per pt, she does not follow up with any nephrology clinic. She denies any history of kidney stone, NSAIDs use or family history. She has diabetes but unaware whether has retinopathy. She has foot wound but denies any recent use of antibiotics.   Past Medical History:     Past Medical History   Diagnosis Date   . Diabetes mellitus    . Chronic kidney disease    . Myocardial infarction    .  Hypertension    . Hyperlipidemia    . Peripheral arterial disease      06/28/13 right superficial femoral artery stenosis, tibial peroneal trunk stenosis, left CVL a stenosis, treated with angiography and angioplasty.   . Coronary artery disease      Left circumflex artery presumed DES.,  Chronically occluded right coronary artery stent   . Anemia      She redid to chronic disease and iron deficiency.     . Arterial leg ulcer      2014, receiving home health   . Chronic obstructive pulmonary disease      Previously on Spiriva and Advair       Past Surgical History:     Past Surgical History   Procedure Laterality Date   . Cardiac angiography and angioplasty  06/2013   . Colonoscopy  Unknown   . Tubal ligation     . Coronary artery bypass N/A 03/04/2014     Procedure: CORONARY ARTERY BYPASS;  Surgeon: Austin Miles, MD;  Location: [facility] HEART OR;  Service: Cardiovascular;  Laterality: N/A;  LIMA to LAD  SVG to OM   . Endoscopic,vein harvest Left 03/04/2014     Procedure: ENDOSCOPIC,VEIN HARVEST;  Surgeon: Austin Miles, MD;  Location: [facility] HEART OR;  Service: Cardiovascular;  Laterality: Left;  By D. Lazarus Gowda, RNFA. Left groin to mid-calf.   Rhae Hammock N/A 03/04/2014     Procedure: TEE;  Surgeon: Austin Miles,  MD;  Location: [facility] HEART OR;  Service: Cardiovascular;  Laterality: N/A;  probe [phone]       Family History:     Family History   Problem Relation Age of Onset   . Coronary artery disease Mother      Died at 51 from MI.   Marland Kitchen Coronary artery disease Father      Died at 14 from MI   . Diabetes type II       Mother and father       Social History:     History     Social History Main Topics   . Smoking status: Former Smoker -- 1.00 packs/day for 20 years     Quit date: 02/29/2012   . Smokeless tobacco: Not on file   . Alcohol Use: Not on file   . Drug Use: Not on file   . Sexual Activity: Not on file       Allergies:     Allergies   Allergen Reactions   . Penicillins Anaphylaxis and Angioedema     Tolerates  cephalosporins        Medications:     Current Facility-Administered Medications   Medication Dose Route Frequency   . aspirin EC  325 mg Oral Daily   . atorvastatin  10 mg Oral QHS   . calcitRIOL  0.25 mcg Oral Daily   . docusate sodium  100 mg Oral Daily   . gabapentin  300 mg Oral BID   . heparin (porcine)  5,000 Units Subcutaneous Q12H [facility]   . hydrALAZINE  25 mg Oral Q8H   . venlafaxine  75 mg Oral BID       Review of Systems:   No fever, chills  No cough, sputum  Admitted with chest pain and palpitation  No abd pain, nausea or vomiting  No urinary symptoms, good urine volume  No swelling  No joint symptoms  No skin rash  No headache, visual changes, focal neurological symptoms  All other systems reviewed and negative for new problems  Physical Exam:   BP 180/77 mmHg  Pulse 50  Temp(Src) 98.1 F (36.7 C) (Oral)  Resp 21  Ht 1.549 m (5\' 1" )  Wt 79.9 kg (176 lb 2.4 oz)  BMI 33.30 kg/m2  SpO2 96%    Intake/Output Summary (Last 24 hours) at 04/24/14 1813  Last data filed at 04/24/14 1735   Gross per 24 hour   Intake   1190 ml   Output   1350 ml   Net   -160 ml       General appearance - alert, oriented, well appearing, and in no distress  HEENT  JVP not raised  Chest -B/L air entry  Heart - S1, S2,   Abdomen - soft, nontender, nondistended,   No leg edema, no other abn on hands and feet    Labs Reviewed:       Recent Labs      04/22/14   1409   WBC  4.94   HEMOGLOBIN  9.7*   HEMATOCRIT  30.5*   PLATELETS  222       Recent Labs      04/24/14   0332  04/23/14   1041   SODIUM  133*  134*   POTASSIUM  4.9  5.3*   CHLORIDE  107  106   CO2  18*  17*   BUN  96*  102*   CREATININE  3.7*  3.8*  GLUCOSE  133*  105*   CALCIUM  8.2*  8.0*   MAGNESIUM  2.6   --        Recent Labs      04/22/14   1409   AST (SGOT)  35*   ALT  88*   ALKALINE PHOSPHATASE  170*   PROTEIN, TOTAL  6.6   ALBUMIN  2.9*       Recent Labs      04/22/14   1409   PT  16.2*   PT INR  1.3*       Rads:     Radiology Results (24 Hour)     ** No results  found for the last 24 hours. Mosie Lukes, MD  [phone]

## 2014-04-24 NOTE — Plan of Care (Signed)
Problem: Safety  Goal: Patient will be free from injury during hospitalization  Outcome: Progressing  Bed placed to the lowest position. Call light within reach. Non skid socks on. Rounded per protocol. Patient demonstrated use of call light for help.     Problem: Pain  Goal: Patient's pain/discomfort is manageable  Outcome: Progressing  Complain of mild headache. Pain level of 3. Refused any pain medication. Dimmed light as requested to help her rest.     Problem: Psychosocial and Spiritual Needs  Goal: Demonstrates ability to cope with hospitalization/illness  Outcome: Progressing  Patient was tearful and anxious about the possible procedure tomorrow. Encouraged to verbalize concern. Patient was concerned about the cardiac monitor beeping. Notified that her vitals are within parameter and MD has been notified about the rhythm. EKG has been done. Will continue to provide emotional support and monitor vitals.     Problem: Tissue integrity  Goal: Damaged tissue is healing and protected  Outcome: Progressing  Wound dressing on the right foot performed. Site cleansed with saline and wet to dry dressing applied. Ace wrap applied. CHG bath given along with peri care and oral care.

## 2014-04-24 NOTE — Progress Notes (Signed)
Dr Willaim Bane notified of pt's increased ectopy on monitor. Unclear if it is a rate related bundle or ventricular. EKG completed. Reviewed VS and pt's increased BP. No new orders at present. Continue to monitor.

## 2014-04-24 NOTE — Progress Notes (Signed)
PROGRESS NOTE    Date Time: 04/24/2014 5:08 PM  Patient Name: [name], [name]      Subjective:    The patient is a 64 year old female admitted with bradycardia.        Review of Systems:   A comprehensive review of systems was: General ROS: negative for - chills, fever or night sweats  ENT ROS: negative for - headaches, nasal congestion, sinus pain or visual changes  Respiratory ROS: negative for - cough, orthopnea, shortness of breath or wheezing  Cardiovascular ROS: negative for - chest pain, dyspnea on exertion, orthopnea or shortness of breath  Gastrointestinal ROS: negative for - abdominal pain, constipation, diarrhea or nausea/vomiting  Genito-Urinary ROS: negative for - change in urinary stream, dysuria, hematuria or nocturia  Musculoskeletal ROS: negative for - joint pain, joint stiffness or joint swelling  Neurological ROS: negative for - confusion, dizziness, headaches, memory loss or speech problems    Physical Exam:     Filed Vitals:    04/24/14 1600   BP: 178/77   Pulse: 45   Temp: 98.1 F (36.7 C)   Resp: 19   SpO2: 96%       General appearance - alert, well appearing, and in no distress  Mental status - alert, oriented to person, place, and time  Eyes - pupils equal and reactive, extraocular eye movements intact  Nose - normal and patent, no erythema, discharge or polyps  Mouth - mucous membranes moist, pharynx normal without lesions  Neck - supple, no significant adenopathy  Lymphatics - no palpable lymphadenopathy, no hepatosplenomegaly  Chest - clear to auscultation, no wheezes, rales or rhonchi, symmetric air entry  Heart - normal rate, regular rhythm, normal S1, S2, no murmurs, rubs, clicks or gallops  Abdomen - soft, nontender, nondistended, no masses or organomegaly  Neurological - alert, oriented, normal speech, no focal findings or movement disorder noted  Musculoskeletal - no joint tenderness, deformity or swelling  Extremities - peripheral pulses normal, no pedal edema, no clubbing or  cyanosis  Skin -  Pos wound base clean     Medications:     Current Facility-Administered Medications   Medication Dose Route Frequency   . aspirin EC  325 mg Oral Daily   . atorvastatin  10 mg Oral QHS   . calcitRIOL  0.25 mcg Oral Daily   . docusate sodium  100 mg Oral Daily   . enoxaparin  30 mg Subcutaneous Daily at 1800   . gabapentin  300 mg Oral BID   . hydrALAZINE  25 mg Oral Q8H   . venlafaxine  75 mg Oral BID       Intake and Output Summary (Last 24 hours) at Date Time    Intake/Output Summary (Last 24 hours) at 04/24/14 1708  Last data filed at 04/24/14 1200   Gross per 24 hour   Intake   1190 ml   Output    800 ml   Net    390 ml           Labs:       Recent Labs  Lab 04/24/14  0332 04/23/14  1041 04/22/14  1936 04/22/14  1409   SODIUM 133* 134* 135* 135*   POTASSIUM 4.9 5.3* 5.4* 5.2*   CHLORIDE 107 106 108 108   CO2 18* 17* 19* 18*   BUN 96* 102* 98* 91*   CREATININE 3.7* 3.8* 3.6* 3.5*   CALCIUM 8.2* 8.0* 8.1* 8.5   ALBUMIN  --   --   --  2.9*   PROTEIN, TOTAL  --   --   --  6.6   BILIRUBIN, TOTAL  --   --   --  0.3   ALKALINE PHOSPHATASE  --   --   --  170*   ALT  --   --   --  88*   AST (SGOT)  --   --   --  35*   GLUCOSE 133* 105* 160* 228*       Recent Labs  Lab 04/22/14  1409   WBC 4.94   HEMOGLOBIN 9.7*   HEMATOCRIT 30.5*   PLATELETS 222               Rads:     Radiology Results (24 Hour)     ** No results found for the last 24 hours. **            Assessment:      The patient is a 64 year old female admitted with bradycardia, chronic  kidney disease, acute kidney injury, heart failure, cardiomyopathy,  diabetes, and history of chronic wound.     AKI      Plan:     Plan is to monitor the patient clinically, correct her hyperkalemia.   Continue aspirin 325 mg daily, Lipitor 10 mg daily, Lovenox 30 mg daily,  hydralazine 25 mg t.i.d., nephrology has been consulted.  Await further  recommendations from cardiology.        Signed by: Mechele Dawley, MD  04/24/2014  5:08 PM

## 2014-04-24 NOTE — Plan of Care (Signed)
Problem: Pain  Goal: Patient's pain/discomfort is manageable  Outcome: Progressing  Complain of headache. Pain level of 5. Verbalized pain level going down to 5 after taking percocet at 1835.Reassessed pain. Pain level was 3. Refused pain medication. Dimmed light to help relax.

## 2014-04-24 NOTE — Plan of Care (Signed)
Problem: Hemodynamic Status: Cardiac  Goal: Stable vital signs and fluid balance  Outcome: Progressing  Patient's heart rhythm/rate remains in bradyarrhythmia, in the 40's and rarely in the 50's. She is asymptomatic.   SBP remains above 160, PO cardizem given without any significant change. Hydralazine IV available for SBP>190.   Adequate urine output. Will continue to monitor.

## 2014-04-24 NOTE — Progress Notes (Signed)
[facility]  PROGRESS NOTE      Date Time: 04/24/2014 9:16 AM  Patient Name: [name]    Signed by: Leeanne Mannan Aracelys Glade    [facility])  [phone]  MD LINE [phone]    Assessment:   1 F with CAD s/p recent CABG, HTN, ICM, chronic systolic and diastolic HF, moderate AS, pw AKI and significant bradyarrhythmia.     Plan:   1. Bradyarrhythmia.  Profound bradyarrhythmia with sinus pauses.  Likely secondary to coreg.   Seems to be having more sinus capture compared to yesterday suggesting possible improvement although slow.    1. Still no indication for TVP at this time but cont to monitor closely in CCU.    2. Will keep NPO in case of PPM implant but may need to wait another 48 hours before making that decision as her rhythm seems to be slowly improving.    2. AKI.  Acute on chronic.  Likely secondary to her bradyarrhythmia, however renal function has not improved.  Consider renal consult.  3. CHF.  Chronic systolic and diastolic HF.  Not acutely decompensated.  Holding lasix.   4. CAD.  Recent CABG.  No chest pain.  Cont ASA, statin.  5. Hypertension.  Starting to develop accelerated HTN.  Will start hydralazine for BP control but allow mild hypertension.  6. Cardiomyopathy.  Likely ischemic.  Holding BB due to above.  Previously on hydralazine and not ACEI/ARB due to CKD.       Patient Active Problem List   Diagnosis   . NSTEMI (non-ST elevated myocardial infarction)   . Hypertensive urgency   . PAD (peripheral artery disease)   . Arterial leg ulcer   . Chronic obstructive pulmonary disease, unspecified COPD type   . Ischemic cardiomyopathy   . CAD (coronary artery disease)   . Diabetes   . Chronic kidney disease   . Debilitated   . Complete heart block   . Junctional bradycardia   . Hyperkalemia       Subjective:   Denies SOB.  + chest wall pain.    Medications:   Medications reviewed    Current Medications   Current Facility-Administered Medications   Medication Dose Route Frequency    . aspirin EC  325 mg Oral Daily   . atorvastatin  10 mg Oral QHS   . calcitRIOL  0.25 mcg Oral Daily   . enoxaparin  30 mg Subcutaneous Daily at 1800   . gabapentin  300 mg Oral BID   . hydrALAZINE  25 mg Oral Q8H   . venlafaxine  75 mg Oral BID              Physical Exam:     Filed Vitals:    04/24/14 0800   BP: 165/77   Pulse: 47   Temp: 97.3 F (36.3 C)   Resp: 18   SpO2: 96%     Temp (24hrs), Avg:97.7 F (36.5 C), Min:97.3 F (36.3 C), Max:98.4 F (36.9 C)    Intake and Output Summary (Last 24 hours) at Date Time    Intake/Output Summary (Last 24 hours) at 04/24/14 0916  Last data filed at 04/24/14 0800   Gross per 24 hour   Intake 1739.17 ml   Output    600 ml   Net 1139.17 ml     Wt Readings from Last 3 Encounters:   04/22/14 79.9 kg (176 lb 2.4 oz)     Vital signs reviewed  Telemetry reviewed: junctional rhythm with intermittent sinus capture    General Appearance:  Breathing comfortable, no acute distress  Head:  normocephalic  Eyes:  EOM's intact, nonicteric sclera  Neck:  No carotid bruit or jugular venous distension  Lungs:  Clear to auscultation throughout, no wheezes, rhonchi or rales, good respiratory effort   Cardiac:  RRR, normal S1, S2, no S3, no S4, no rub, no murmurs   Abdomen:  Soft, non-tender, positive bowel sounds  Extremities:  No cyanosis or clubbing.  No LE edema  Vascular: 2+ radial and distal pulses bilaterally  Neurologic:  Alert and oriented x3, mood and affect normal    Labs:   Labs reviewed    CBC w/Diff     Recent Labs  Lab 04/22/14  1409   WBC 4.94   HEMOGLOBIN 9.7*   HEMATOCRIT 30.5*   PLATELETS 222          Basic Metabolic Profile     Recent Labs  Lab 04/24/14  0332 04/23/14  1041 04/22/14  1936   SODIUM 133* 134* 135*   POTASSIUM 4.9 5.3* 5.4*   CHLORIDE 107 106 108   CO2 18* 17* 19*   BUN 96* 102* 98*   CREATININE 3.7* 3.8* 3.6*   EGFR 14.9 14.4 15.4   GLUCOSE 133* 105* 160*   CALCIUM 8.2* 8.0* 8.1*            Cardiac Enzymes     Recent Labs  Lab 04/22/14  1407    TROPONIN I 0.03          Thyroid Studies         Invalid input(s): FREET4       Cholesterol Panel            Coagulation Studies     Recent Labs  Lab 04/22/14  1409   PT 16.2*   PT INR 1.3*              Imaging:

## 2014-04-24 NOTE — Plan of Care (Signed)
Problem: Pain  Goal: Patient's pain/discomfort is manageable  Outcome: Progressing  Percocet given for generalized pain 7/10 with positive effect.   Encouraged non-pharmacological pain management  Stool softener added to medication regime.     Problem: Moderate/High Fall Risk Score >/=15  Goal: Patient will remain free of falls  Outcome: Progressing  Fall prevention safety plan discussed with patient, she verbalized understanding. Bed alarm on, call bell within reach, hourly rounding in progress.

## 2014-04-25 ENCOUNTER — Inpatient Hospital Stay: Payer: Medicare Other

## 2014-04-25 LAB — CHLORIDE, URINE, RANDOM: Chloride, UR: <20 mEq/L

## 2014-04-25 LAB — BASIC METABOLIC PANEL
Anion Gap: 8 (ref 5.0–15.0)
BUN: 89 mg/dL — ABNORMAL HIGH (ref 7–19)
CO2: 18 mEq/L — ABNORMAL LOW (ref 22–29)
Calcium: 8.6 mg/dL (ref 8.5–10.5)
Chloride: 109 mEq/L (ref 100–111)
Creatinine: 3.4 mg/dL — ABNORMAL HIGH (ref 0.6–1.0)
Glucose: 123 mg/dL — ABNORMAL HIGH (ref 70–100)
Potassium: 4.9 mEq/L (ref 3.5–5.1)
Sodium: 135 mEq/L — ABNORMAL LOW (ref 136–145)

## 2014-04-25 LAB — CBC AND DIFFERENTIAL
Basophils Absolute Automated: 0.01 10*3/uL (ref 0.00–0.20)
Basophils Automated: 0 %
Eosinophils Absolute Automated: 0.02 10*3/uL (ref 0.00–0.70)
Eosinophils Automated: 0 %
Hematocrit: 30.8 % — ABNORMAL LOW (ref 37.0–47.0)
Hgb: 9.6 g/dL — ABNORMAL LOW (ref 12.0–16.0)
Immature Granulocytes Absolute: 0.02 10*3/uL
Immature Granulocytes: 0 %
Lymphocytes Absolute Automated: 0.7 10*3/uL (ref 0.50–4.40)
Lymphocytes Automated: 11 %
MCH: 27.1 pg — ABNORMAL LOW (ref 28.0–32.0)
MCHC: 31.2 g/dL — ABNORMAL LOW (ref 32.0–36.0)
MCV: 87 fL (ref 80.0–100.0)
MPV: 9.9 fL (ref 9.4–12.3)
Monocytes Absolute Automated: 0.65 10*3/uL (ref 0.00–1.20)
Monocytes: 10 %
Neutrophils Absolute: 4.89 10*3/uL (ref 1.80–8.10)
Neutrophils: 78 %
Nucleated RBC: 0 /100 WBC (ref 0–1)
Platelets: 208 10*3/uL (ref 140–400)
RBC: 3.54 10*6/uL — ABNORMAL LOW (ref 4.20–5.40)
RDW: 19 % — ABNORMAL HIGH (ref 12–15)
WBC: 6.27 10*3/uL (ref 3.50–10.80)

## 2014-04-25 LAB — GLUCOSE WHOLE BLOOD - POCT
Whole Blood Glucose POCT: 141 mg/dL — ABNORMAL HIGH (ref 70–100)
Whole Blood Glucose POCT: 163 mg/dL — ABNORMAL HIGH (ref 70–100)

## 2014-04-25 LAB — URINALYSIS WITH MICROSCOPIC
Bilirubin, UA: NEGATIVE
Blood, UA: NEGATIVE
Glucose, UA: NEGATIVE
Ketones UA: NEGATIVE
Nitrite, UA: NEGATIVE
Protein, UR: 100 — AB
Specific Gravity UA: 1.012 (ref 1.001–1.035)
Urine pH: 7 (ref 5.0–8.0)
Urobilinogen, UA: NEGATIVE mg/dL

## 2014-04-25 LAB — MAGNESIUM: Magnesium: 2.7 mg/dL — ABNORMAL HIGH (ref 1.6–2.6)

## 2014-04-25 LAB — GFR: EGFR: 16.4

## 2014-04-25 LAB — SODIUM, URINE, RANDOM: Urine Sodium Random: <20 mEq/L

## 2014-04-25 LAB — PTH, INTACT: PTH Intact: 346.8 pg/mL — ABNORMAL HIGH (ref 9.0–72.0)

## 2014-04-25 LAB — POTASSIUM, URINE, RANDOM: Urine Potassium Random: 25.7 mEq/L

## 2014-04-25 LAB — CREATININE, URINE, RANDOM: Urine Creatinine, Random: 80.6

## 2014-04-25 LAB — PHOSPHORUS: Phosphorus: 4.9 mg/dL — ABNORMAL HIGH (ref 2.3–4.7)

## 2014-04-25 MED ORDER — HYDRALAZINE HCL 25 MG PO TABS
25.0000 mg | ORAL_TABLET | Freq: Once | ORAL | Status: AC
Start: 2014-04-25 — End: 2014-04-25
  Administered 2014-04-25: 25 mg via ORAL
  Filled 2014-04-25: qty 1

## 2014-04-25 MED ORDER — LEVOFLOXACIN IN D5W 250 MG/50ML IV SOLN
250.0000 mg | INTRAVENOUS | Status: DC
Start: 2014-04-25 — End: 2014-04-27
  Administered 2014-04-25 – 2014-04-26 (×2): 250 mg via INTRAVENOUS
  Filled 2014-04-25 (×3): qty 50

## 2014-04-25 MED ORDER — POLYETHYLENE GLYCOL 3350 17 G PO PACK
17.0000 g | PACK | Freq: Every day | ORAL | Status: DC | PRN
Start: 2014-04-25 — End: 2014-04-28
  Administered 2014-04-25: 17 g via ORAL
  Filled 2014-04-25: qty 1

## 2014-04-25 MED ORDER — HYDRALAZINE HCL 25 MG PO TABS
50.0000 mg | ORAL_TABLET | Freq: Three times a day (TID) | ORAL | Status: DC
Start: 2014-04-25 — End: 2014-04-25
  Administered 2014-04-25: 50 mg via ORAL
  Filled 2014-04-25: qty 2

## 2014-04-25 MED ORDER — NIFEDIPINE ER OSMOTIC RELEASE 30 MG PO TB24
60.0000 mg | ORAL_TABLET | Freq: Every day | ORAL | Status: DC
Start: 2014-04-25 — End: 2014-04-27
  Administered 2014-04-25 – 2014-04-27 (×3): 60 mg via ORAL
  Filled 2014-04-25 (×3): qty 2

## 2014-04-25 MED ORDER — BISACODYL 10 MG RE SUPP
10.0000 mg | Freq: Every day | RECTAL | Status: DC | PRN
Start: 2014-04-25 — End: 2014-04-27

## 2014-04-25 MED ORDER — SODIUM CHLORIDE 0.9 % IV SOLN
INTRAVENOUS | Status: DC
Start: 2014-04-25 — End: 2014-04-26
  Administered 2014-04-25: 50 mL/h via INTRAVENOUS

## 2014-04-25 MED ORDER — SODIUM BICARBONATE 650 MG PO TABS
650.0000 mg | ORAL_TABLET | Freq: Two times a day (BID) | ORAL | Status: DC
Start: 2014-04-25 — End: 2014-04-28
  Administered 2014-04-25 – 2014-04-28 (×6): 650 mg via ORAL
  Filled 2014-04-25 (×6): qty 1

## 2014-04-25 MED ORDER — HYDRALAZINE HCL 25 MG PO TABS
75.0000 mg | ORAL_TABLET | Freq: Three times a day (TID) | ORAL | Status: DC
Start: 2014-04-25 — End: 2014-04-26
  Administered 2014-04-25 – 2014-04-26 (×2): 75 mg via ORAL
  Filled 2014-04-25 (×2): qty 3

## 2014-04-25 NOTE — Plan of Care (Signed)
Problem: Safety  Goal: Patient will be free from injury during hospitalization  Outcome: Progressing  Patient has been assisted to the bedside commode and no falls have been observed.      Problem: Psychosocial and Spiritual Needs  Goal: Demonstrates ability to cope with hospitalization/illness  Outcome: Progressing  During the beginning of the shift, patient was tearful. One to one support provided and patient reported concern of her being NPO and the possibility of having a surgery. After education and support, patient was able to be calm and tolerate sitting on a chair and working with physical therapy with no complain of nausea/pain.     Problem: Hemodynamic Status: Cardiac  Goal: Stable vital signs and fluid balance  Outcome: Progressing  Patient's vital signs monitor and systolic blood pressure elevated for a short while. Attending cardiologist notified after PO and IV pf hydralazine, which was not effective. A one time dose along with an increased of hydralazine was prescribed, which was effective.

## 2014-04-25 NOTE — OT Progress Note (Deleted)
Occupational Therapy Cancellation Note    Patient: [name]  ZOX:[id number]    Unit: J811/B147-82    Patient not seen for occupational therapy secondary to pt is off the unit for test.  OT to follow up to perform evaluation.    Rosalia Hammers, OT  04/25/2014  3:24 PM

## 2014-04-25 NOTE — OT Progress Note (Signed)
Occupational Therapy Cancellation Note    Patient: [name]  ZOX:[id number]    Unit: J811/B147-82    Patient not seen for occupational therapy secondary to bradycardia.  Pt may be scheduled for transfer in am for pacemaker placement.  OT to follow with pt tomorrow to verify.    Rosalia Hammers, OT  04/25/2014  3:37 PM

## 2014-04-25 NOTE — Consults (Addendum)
Reason for Consultation:   Chronic arterial/venous right foot wound      History of present illness:  [name] 64 yo was admitted due to bradycardia. She has a history of chronic right foot ulcer over two years in duration due to sever PAD. PMH includes DM, CABG 2015,CKD III, peripheral angioplasty as outpatient, not sure of date. Dr Murrell Converse [facility]  is following patient will contact the [facility], wound culture 04/24/14 + for heavy growth of proteus.       Wound assessment:  Chronic wound on right medial aspect, two wounds superior 6.0 x 2.5 0.5cm and 2.8 x 1.8 x 0.5cm, dry tan colored tissue? Bone exposed in inferior wound. Scant drainage with odor from wound. Leg cleansed and vaseline applied to periwound dry flaky skin, DSD applied. Legs elevated.        Plan:  Request for consult faxed to [facility] for follow up with the podiatry team.      Madison Jacobson RN [facility] ext [phone]

## 2014-04-25 NOTE — PT Progress Note (Signed)
Physical Therapy Note    [facility]  [address]  [place] [address]  [phone]    Physical Therapy Treatment    Patient:  [name]        MRN#:  [id number]  Unit:  CCU CRITICAL CARE        Room/Bed:  M602/M602-01    Medical Diagnosis: Diabetes mellitus type II, uncontrolled [E11.65]  Renal insufficiency [N28.9]  Junctional bradycardia [R00.1]  Aspiration pneumonia of right lower lobe, unspecified aspiration pneumonia type [J69.0]  Complete heart block [I44.2]    Time of treatment:  PT Received On: 04/25/14  Start Time: 1515 Stop Time: 1555  Time Calculation (min): 40 min    Treatment #: PT Visit Number: 2/7    Patient's medical condition is appropriate for Physical Therapy intervention at this time.    Assessment   Pt able to participate in therapy slightly more actively with stable HR and SpO2 on 4L/min via NC, but BP still elevates with activity and LE weakness are currently limiting transfers and ambulation.    Plan   Recommendation  Discharge Recommendation: Home with supervision, Home with home health PT, Acute Rehab  PT Frequency: 3-4x/wk    If Home with supv and HHPT recommended d/c disposition is not available, patient will need AR. Pt now more inclined to return to rehab if not at appropriate/safe functional status for return home.    Continue plan of care.    Interdisciplinary Communication: Nursing made aware of session outcome.    Subjective   Mild dizziness reported with EOB positioning.  Patient is agreeable to participation in the therapy session. Nursing clears patient for therapy.  Pain: None reported/indicated    Vitals:   Filed Vitals:    04/25/14 1436 04/25/14 1500 04/25/14 1515 04/25/14 1530   BP: 197/108 199/92 Supine 199/82 Seated 184/77 Standing   Pulse:  52 54 53   Temp:       TempSrc:       Resp:  32     Height:       Weight:       SpO2:  95%         Objective     Precautions/ Contraindications:  Fall, contact isolation    Patient is in bed with  Telemetry,  Intravenous Access, Pulse Oximeter , O2 at 4 liters/minute via NC, Dressing R foot and Sequential Compression Device (SCD) in place.    Therapeutic exercises:  Ankle Pumps: 10  Quad Sets: 10  Glut Sets: 10  Heel Slides: 10  Long Arc Quad: 5 EOB, became too fatigued to continue      Functional Mobility:  Rolling: CG  Supine to Sit: Min A/CG  Scooting: Min A  Sit to Supine: CG  Sit to Stand: Min A of 2  Stand to Sit: Min A  Teacher, early years/pre: on/off EOB    Gait:   WB status: WBAT RLE  Assistive Device: RW  Assist Level: Min A  Distance: 3 steps to right  Pattern: narrow BOS, LOB to rear   Stairs/Curbs: NT    Balance Training:  EOB fair, standing poor-fair with UE support on RW    Educated the patient to role of physical therapy, plan of care, goals  of therapy and HEP, safety with mobility and ADLs, energy conservation techniques, pursed lip breathing, home safety.    Patient left in bed with all needs met, equipment intact and call bell within reach. RN notified of  session outcome.   Mobility status posted at bedside and within E.M.R.    Goals per Eval/ Re-eval:   Goals  Goal Formulation: With patient  Time for Goal Acheivement: By time of discharge  Goals: Select goal  Pt Will Go Supine To Sit: independent, to maximize functional mobility and independence  Pt Will Perform Sit to Stand: modified independent, to maximize functional mobility and independence  Pt Will Ambulate: 101-150 feet, with four wheel walker, with supervision, to maximize functional mobility and independence  Pt Will Go Up / Down Stairs: 1-2 stairs, with supervision, With rail    Signature:  Chester Holstein, PT  04/25/2014 3:55 PM   Phone: [phone]

## 2014-04-25 NOTE — Plan of Care (Signed)
Problem: Hemodynamic Status: Cardiac  Goal: Stable vital signs and fluid balance  Outcome: Progressing  Running Junctional with occasional sinus beats and accelerating tachy -brady episodes. Vitals remains within parameter. No complain of chest complain or respiratory distress. Dr Willaim Bane was made aware. Will continue to monitor.

## 2014-04-25 NOTE — Progress Notes (Signed)
Nutrition Follow-up    Assessment:    Active Hospital Problems    Diagnosis   . Complete heart block   . Hyperkalemia       Orders Placed This Encounter   Procedures   . Diet NPO time specified Except for: SIPS WITH MEDS   . Diet cardiac renal 50 GM Protein   eating 75-100% per nurse    Current Meds:    aspirin EC 325 mg Daily   atorvastatin 10 mg QHS   calcitRIOL 0.25 mcg Daily   docusate sodium 100 mg Daily   gabapentin 300 mg BID   heparin (porcine) 5,000 Units Q12H [facility]   hydrALAZINE 50 mg Q8H   venlafaxine 75 mg BID          Recent Labs:    Recent Labs  Lab 04/22/14  1409   WBC 4.94   HEMOGLOBIN 9.7*   HEMATOCRIT 30.5*   MCV 86.6   PLATELETS 222       Recent Labs  Lab 04/25/14  0414 04/24/14  0332 04/23/14  1041 04/22/14  1936 04/22/14  1409   SODIUM 135* 133* 134* 135* 135*   POTASSIUM 4.9 4.9 5.3* 5.4* 5.2*   CHLORIDE 109 107 106 108 108   CO2 18* 18* 17* 19* 18*   BUN 89* 96* 102* 98* 91*   CREATININE 3.4* 3.7* 3.8* 3.6* 3.5*   GLUCOSE 123* 133* 105* 160* 228*   CALCIUM 8.6 8.2* 8.0* 8.1* 8.5   MAGNESIUM 2.7* 2.6  --   --   --    PHOSPHORUS 4.9*  --   --   --   --    EGFR 16.4 14.9 14.4 15.4 15.9       Recent Labs  Lab 04/22/14  1409   ALBUMIN 2.9*         Intake/Output Summary (Last 24 hours) at 04/25/14 1159  Last data filed at 04/25/14 1100   Gross per 24 hour   Intake   1500 ml   Output   1520 ml   Net    -20 ml       Anthropometrics  Height: 154.9 cm (5\' 1" )  Weight: 79.9 kg (176 lb 2.4 oz)  Weight Change: 6.39  IBW/kg (Calculated) Female: 50.91 kg  IBW/kg (Calculated) Female: 47.73 kg  BMI (calculated): 33.4, obese grade 1    Estimated Nutrition Needs:  Estimated Energy Needs  Total Energy Estimated Needs: 1400-1700 cal  Method for Estimating Needs: IBW x ( 24-28 ) cal    Estimated Protein Needs  Total Protein Estimated Needs: 48-60 g  Method for Estimating Needs: IBW x ( 0.8-1.0 ) g    Estimated Carbohydrate Needs  Total Carbohydrate Estimated Needs: 175-212 g  Method for Estimating Needs: 50% of calorie  needs    Fluid Needs  Total Fluid Estimated Needs: 1800 ml  Method for Estimating Needs: IBW x 30 ml    Learning and Discharge Planning Needs: NA    Nutrition Diagnosis:   Inadequate oral intake improved    Intervention:  Rec: add consistent carbohydrate modifier.     Goals:  Maintain current intake to meet estimated needs for wound healing.     M/E:  Will continue to monitor po intake and labs within 4 days.     Leeanne Deed, RDN, CLT

## 2014-04-25 NOTE — Progress Notes (Signed)
Madison Edward, MD  [facility]  Tel:  [phone]      Assessment :   Patient is a 64 year old female with history of coronary artery disease status post coronary artery bypass grafting December 2015, nonhealing right lower extremity ulcer, hypertension, hyperlipidemia, chronic kidney disease, moderate AS, ICM LVEF 45% presents to the ED with complaints of fatigue, dizziness, found to be significantly bradycardic in junctional rhythm with Acute on CRI   Bradycardia:  Continues to have junctional rhythm with retrograde P waves . Now with intermittent sinus capture .Likely secondary to high-dose beta blockers. Last dose 72 hrs ago   CAD S/p prior PCI , recent CABGx2 02/2014: Currently stable   Chronic diastolic heart failure: Now with SOB . Concerned about early pul edema   Acute on chronic renal insufficiency, likely in the setting of significant bradycardia; Now with ATN   Diabetes with high blood sugars on admission   Poor healing ankle ulcer ( recent Peripheral angio no sig obstructive dz and hence not intervened upon   Ischemic cardiomyopathy, LVEF of 40 percent: Was on  beta blocker and hydralazine as an outpatient.  No ACEI, ARB due to  CRI. Now off BB   HTN: Severe    Plan:   Increase hydralazine to 50 mg every 8 hrs  If continues to be  Bradycardic ,will  schedule pacemaker in the a.m.Marland Kitchen  Discussed benefits and risks of pacemaker implantation.  Patient agreeable  Check chest x-ray,  to exclude early pulmonary edema.  Buckner fluids  EKG today  Echo today        Cardiology Diagnostics     Telemetry:  (I have personally reviewed the telemetry strips)  Junctional rhythm with retrograde P waves.  Intermittent sinus capture    Echocardiogram: ( personally reviewed the report)02/2014  LVEF 45%, Mod AS,     Problem list     Patient Active Problem List   Diagnosis   . NSTEMI (non-ST elevated myocardial infarction)   . Hypertensive urgency   . PAD (peripheral artery disease)   . Arterial leg  ulcer   . Chronic obstructive pulmonary disease, unspecified COPD type   . Ischemic cardiomyopathy   . CAD (coronary artery disease)   . Diabetes   . Chronic kidney disease   . Debilitated   . Complete heart block   . Junctional bradycardia   . Hyperkalemia            Events/ROS/ Subjective since last 24 hrs:         No symptoms of chest discomfort   Complaints of shortness of breath with exertion     Objective:   Vitals reviewed     VS: BP 177/84 mmHg  Pulse 48  Temp(Src) 98.4 F (36.9 C) (Oral)  Resp 22  Ht 1.549 m (5\' 1" )  Wt 79.9 kg (176 lb 2.4 oz)  BMI 33.30 kg/m2  SpO2 95%    Intake/Output Summary (Last 24 hours) at 04/25/14 0813  Last data filed at 04/25/14 0700   Gross per 24 hour   Intake   1400 ml   Output   1400 ml   Net      0 ml       Constitutional : no acute distress,  Respiratory:  Poor air movement and respiratory effort  bilaterally. No use of accessory muscles. Basilar rales+  Cardiovascular: Regular . Nl S1 and S2.. 2/6 SM at the LSB No carotid bruits.  Mild  JVD  Dorsal  pedis  Left feeble. Right bandaged. Trace edema b/l   Gastrointestinal Soft. Non-tender. Normoactive BS. No abdominal bruits  Psychiatric: AAO X3.  Normal mood and effect.    Laboratory Studies:   (I have personally reviewed the laboratory values below)      CBC w/Diff     Recent Labs  Lab 04/22/14  1409   WBC 4.94   HEMOGLOBIN 9.7*   HEMATOCRIT 30.5*   PLATELETS 222          Basic Metabolic Profile     Recent Labs  Lab 04/25/14  0414 04/24/14  0332 04/23/14  1041   SODIUM 135* 133* 134*   POTASSIUM 4.9 4.9 5.3*   CHLORIDE 109 107 106   CO2 18* 18* 17*   BUN 89* 96* 102*   CREATININE 3.4* 3.7* 3.8*   EGFR 16.4 14.9 14.4   GLUCOSE 123* 133* 105*   CALCIUM 8.6 8.2* 8.0*            Cardiac Enzymes     Recent Labs  Lab 04/22/14  1407   TROPONIN I 0.03          Thyroid Studies     Recent Labs  Lab 04/24/14  0332   THYROID-STIMULATING HORMONE (TSH) BASELINE 5.74*          Cholesterol Panel            Coagulation Studies      Recent Labs  Lab 04/22/14  1409   PT 16.2*   PT INR 1.3*              Current Medications   Meds reviewed:    Current Facility-Administered Medications   Medication Dose Route Frequency   . aspirin EC  325 mg Oral Daily   . atorvastatin  10 mg Oral QHS   . calcitRIOL  0.25 mcg Oral Daily   . docusate sodium  100 mg Oral Daily   . gabapentin  300 mg Oral BID   . heparin (porcine)  5,000 Units Subcutaneous Q12H [facility]   . hydrALAZINE  25 mg Oral Q8H   . venlafaxine  75 mg Oral BID                  Madison Edward, MD  04/25/2014 8:13 AM

## 2014-04-25 NOTE — Progress Notes (Signed)
Severe Sepsis Screen    Date: 04/25/2014 Time: 11:36 PM  Nurse Signature: Nolen Mu    Exclusions:      Patients meeting the following criteria are excluded from screening:     []  Suspicion or diagnosis of sepsis is documented and until 72 hours after antibiotics started or last regimen change:   - If Yes, Date of Documented Sepsis:                                          - If Yes, Date of last change in antibiotics:                                           []  Surgery - No screening for 24 hours after surgery   - If Yes, Date of Surgery:                                           []  Arctic Sun hypothermia protocol- Resume screening when arctic sun complete   []  Comfort Care Orders- Do not resume screening    Did you check any of the boxes above?     [x]  No, Continue to section A   []  Yes, Stop Here, Patient Excluded from Sepsis Screening. If screening should resume in the future, place "sticky note to treatment team" with date/time of when screening should resume. Communicate patient excluded from screening due to Comfort Care Orders using "sticky note to treatment team."    A. Infection:      Does your patient have ONE or more of the following infection criteria?     []  Documented Infection - Does the patient have positive culture results (from blood, sputum, urine, etc)?   []  Anti-Infective Therapy - Is the patient receiving antibiotic, antifungal, or other anti-infective therapy?   []  Pneumonia - Is there documentation of pneumonia (X Ray, etc)?   []  WBC's - Have WBC's been found in normally sterile fluid (urine, CSF, etc.)?   []  Perforated Viscus - Does the patient have a perforated hollow organ (bowel)?    A.  Did you check any of the boxes above?     [x]  No, Stop Here and Sepsis Screen Negative   []  Yes, continue to section B    B. SIRS:      Does your patient have TWO or more of the following SIRS criteria?     []  Temperature - Is the patient's temperature: Temp: 97 F (36.1 C) (04/25/14  2000)   - Greater than or equal to 38.3 degrees C (greater than 100.9 degrees F)?   - Less than or equal to 36 degrees C (less than or equal to 96.8 degrees F)?    []  Heart Rate: Heart Rate: (!) 59 (04/25/14 2300)   - Is the patient's heart rate greater than or equal to 90 bpm?     []  Respiratory: Resp Rate: 22 (04/25/14 2300)   - Is the patient's respiratory rate greater than 20?     []  WBC Count - Is the patient's WBC count:   Recent Labs  Lab 04/25/14  1816   WBC 6.27       -  Greater than or equal to 12,000/mm3 OR   - Less than or equal to 4,000/mm3 OR    - Are there greater than 10% immature neutrophils (bands)?     []  Glucose >140 without diabetes or on steroids?   Recent Labs  Lab 04/25/14  0414   GLUCOSE 123*         []  Significant edema is present?    B.  Did you check two or more of the boxes above?     []  No, Stop Here and Sepsis Screen Negative   []  Yes, contact Charge Nurse, continue to section C    C. ACUTE Organ Dysfunction:      Does your patient have ONE or more of the following organ dysfunction? (May need to wait for lab results for assessment - see below) Organ dysfunction must be a result of the sepsis NOT CHRONIC conditions.    []  Cardiovascular - Does the patient have a: BP: (!) 215/93 mmHg (04/25/14 2300)   - Systolic Blood Pressure less than or equal to 90 mmHg OR   - Systolic Blood Pressure has dropped 40 mmHg or more from baseline OR   - Mean Arterial Pressure less than or equal to 70 mmHg (for at least one hour despite fluid resuscitation OR   - require vasopressor support?     []  Respiratory - Does the patient have new hypoxia defined by any of the following?   - A sustained increase in oxygen requirements by at least 2L/min on NC or 28% FiO2 within the last 24 hrs OR   - A persistent decrease in oxygen saturation of greater than or equal to 5% lasting at least four or more hours and occurring within the last 24 hours     []  Renal - Does the patient have:   - low urine output  (e.g. Less than 0.5 mL/kg/HR for one hour despite adequate fluid resuscitation OR   - Increased creatinine (greater than 50% increase from baseline) OR   - require acute dialysis?     []  Hematologic - Does the patient have:   - Low platelet count (less than 100,000 mm3)   Recent Labs  Lab 04/25/14  1816   PLATELETS 208    OR   - INR/aPTT greater than upper limit of normal?   Recent Labs  Lab 04/22/14  1409   PT INR 1.3*    OR        []  Metabolic - Does the patient have a high lactate (plasma lactate greater than or equal to 2.4 mMol/L?        []  Hepatic - Are the patient's liver enzymes elevated (ALT greater than 72 IU/L or Total Bilirubin greater than 2 MG/dL)?   Recent Labs  Lab 04/22/14  1409   BILIRUBIN, TOTAL 0.3   ALT 88*         []  CNS - Does the patient have altered consciousness or reduced Glasgow Coma Scale?     Other Active Diagnoses that may be contributing to signs of end organ dysfunction (Ex. Chronic kidney disease, cirrhosis):   - ________________________________________________________________      C.  Did you check any of the boxes above?     []  No, Sepsis Screen Negative   []  YES:  A) Infection + B) SIRS + C) Acute Organ Dysfunction = Positive Screen for Severe Sepsis. Notify attending (house officer during off-hours).     Notify Attending/House Technical sales engineer and document in Complex Assessment under provider  notification   - Name of physician notified:                                           - Date/Time Notifiied:                                             Document actions:   _0  Lactate drawn   _1  Blood Cultures obtained   _2  Antibiotics initiated or modified   _3   IV Fluid administered 0.9% NS __________ mLs given   Nursing Comments/Narrative:    - ______________________________________________________________     The Surviving Sepsis Guidelines recommend the following interventions to be completed within one hour of a positive sepsis screen.   Obtain new blood cultures prior to  antibiotic administration if not done within the last 24 hours.   Obtain lactate level, if initial lactate > 62mol, repeat lactate in 2 hours for goal decrease 10-20%; If there is not a decrease call [facility], hospital  If SBP < 90 or MAP < 65 or lactate greater than 4 mmol/dl; Start 0.9% NS IV Fluid Bolus of 30 mL/Kg (minimum) to maintain MAP > 65    Initiate vasopressors for hypotension not responding to fluid resuscitation (1st line Norepinephrine 1 - 300 mcg/min IV) - Patient must be in CCU if requires vasopressor   Initiate or escalate antibiotic therapy   Goal urine output greater than/equal to 0.5 mL/kg/hr

## 2014-04-25 NOTE — Progress Notes (Addendum)
PROGRESS NOTE    Hospital Day: 3    Assessment:     Acute kidney injury.  Improving with gentle hydration.  Chronic kidney disease stage IV.  Hypertension with CKD. BP not control at this time   Metabolic acidosis.  Likely renal related.  Anemia of chronic kidney disease.  Iron deficiency needs to be ruled out.  Secondary hyperparathyroidism with Intact PTH high.  Bradycardia.    Plan:     Start on gentle hydration with  50 ML of normal saline.  Avoid beta blockers at this time.  Start on Nifedipine ER 60 mg daily.   Start on sodium bicarbonate 650 mg oral twice a day for renal protection and correction of acidosis.  Check iron levels and ferritin.  We will follow the patient closely      Subjective:     Patient seen today in her room.  She will discuss with her about her kidney care.  It appears that she was seen by Dr. Lowell Bouton in the hospital, however, she does not have any patient-doctor relationship in the office;  in addition, she has an outpatient and does not want to follow up with his group    Review of Systems:   General:  no fever, no chills, no rigor, awake and alert, feeling better   HEENT: no neck pain, no throat pain  Endocrine: no fatigue   Respiratory: no cough, shortness of breath, or wheezing   Cardiovascular: no chest pain   Gastrointestinal: no abdominal pain,no N/V/D  Musculoskeletal: no edema  Neurological: no focal weakness  Dermatological: no rash, no ulcer    Physical Exam:     Filed Vitals:    04/25/14 1530   BP: 184/77   Pulse: 53   Temp:    Resp:    SpO2:        Intake and Output Summary (Last 24 hours) at Date Time    Intake/Output Summary (Last 24 hours) at 04/25/14 1557  Last data filed at 04/25/14 1500   Gross per 24 hour   Intake   1850 ml   Output   1070 ml   Net    780 ml         General: No acute distress.  HEENT: No pallor.  Neck: No jugular venous distension.  Chest: Clear to auscultation.   CVS: Regular rate and rhythm. Normal S1S2. No murmur or rub.  Abdomen: Nontender, non  distended. Positive bowel sounds.  No lower extremity edema.  No rash on limbs.     Scheduled Meds: Continuous Infusions:      aspirin EC 325 mg Oral Daily   atorvastatin 10 mg Oral QHS   calcitRIOL 0.25 mcg Oral Daily   docusate sodium 100 mg Oral Daily   gabapentin 300 mg Oral BID   heparin (porcine) 5,000 Units Subcutaneous Q12H [facility]   hydrALAZINE 75 mg Oral Q8H [facility]   levofloxacin 250 mg Intravenous Q24H   venlafaxine 75 mg Oral BID        . sodium chloride            Labs:       Recent Labs  Lab 04/22/14  1409   WBC 4.94   HEMOGLOBIN 9.7*   HEMATOCRIT 30.5*   PLATELETS 222       Recent Labs  Lab 04/25/14  0414 04/24/14  0332 04/23/14  1041  04/22/14  1409   SODIUM 135* 133* 134* More results in Results Review 135*  POTASSIUM 4.9 4.9 5.3* More results in Results Review 5.2*   CHLORIDE 109 107 106 More results in Results Review 108   CO2 18* 18* 17* More results in Results Review 18*   BUN 89* 96* 102* More results in Results Review 91*   CREATININE 3.4* 3.7* 3.8* More results in Results Review 3.5*   EGFR 16.4 14.9 14.4 More results in Results Review 15.9   GLUCOSE 123* 133* 105* More results in Results Review 228*   CALCIUM 8.6 8.2* 8.0* More results in Results Review 8.5   ALBUMIN  --   --   --   --  2.9*   PHOSPHORUS 4.9*  --   --   --   --    More results in Results Review = values in this interval not displayed.      URINE TYPE   Date Value Ref Range Status   04/25/2014 Clean Catch  Final     COLOR, UA   Date Value Ref Range Status   04/25/2014 Yellow Clear - Yellow Final     CLARITY, UA   Date Value Ref Range Status   04/25/2014 Sl Cloudy* Clear - Hazy Final     SPECIFIC GRAVITY UA   Date Value Ref Range Status   04/25/2014 1.012 1.001-1.035 Final     URINE PH   Date Value Ref Range Status   04/25/2014 7.0 5.0-8.0 Final     NITRITE, UA   Date Value Ref Range Status   04/25/2014 Negative Negative Final     KETONES UA   Date Value Ref Range Status   04/25/2014 Negative Negative Final     UROBILINOGEN, UA    Date Value Ref Range Status   04/25/2014 Negative 0.2  -  2.0 mg/dL Final     BILIRUBIN, UA   Date Value Ref Range Status   04/25/2014 Negative Negative Final     BLOOD, UA   Date Value Ref Range Status   04/25/2014 Negative Negative Final     RBC, UA   Date Value Ref Range Status   04/25/2014 0 - 5 0 - 5 /hpf Final     WBC, UA   Date Value Ref Range Status   04/25/2014 26 - 50* 0 - 5 /hpf Final       Rads:     Radiology Results (24 Hour)     Procedure Component Value Units Date/Time    US Renal Kidney [[id number]] Resulted:  04/25/14 1447    Order Status:  Sent Updated:  04/25/14 1550    XR Chest AP Portable [[id number]] Collected:  04/25/14 0917    Order Status:  Completed Updated:  04/25/14 0923    Narrative:      INDICATION: Congestive heart failure    COMPARISON: 04/22/2014    FINDINGS:  A single radiograph of the chest performed. Portable film.  The heart is enlarged in size. Median sternotomy  The mediastinal and hilar structures are within normal limits.  The lung fields demonstrates stable right pleural effusion. Adjacent  right lower lung subsegmental atelectasis or infiltrate.Marland Kitchen  No pneumothorax.    The  visualized osseous structures demonstrates no acute abnormality.      Impression:       Stable right lower lung effusion and adjacent infiltrate or  atelectasis.    Kinnie Feil, MD   04/25/2014 9:19 AM          Radiological Procedure reviewed.  Cristal Ford, MD  04/25/2014

## 2014-04-25 NOTE — Plan of Care (Signed)
Interdisciplinary round was done with Dr. Osa Craver.

## 2014-04-25 NOTE — Progress Notes (Signed)
PROGRESS NOTE    Date Time: 04/25/2014 1:35 PM  Patient Name: [name], [name]      Subjective:    The patient is a 64 year old female admitted with bradycardia.        Review of Systems:   A comprehensive review of systems was: General ROS: negative for - chills, fever or night sweats  ENT ROS: negative for - headaches, nasal congestion, sinus pain or visual changes  Respiratory ROS: negative for - cough, orthopnea, shortness of breath or wheezing  Cardiovascular ROS: negative for - chest pain, dyspnea on exertion, orthopnea or shortness of breath  Gastrointestinal ROS: negative for - abdominal pain, constipation, diarrhea or nausea/vomiting  Genito-Urinary ROS: negative for - change in urinary stream, dysuria, hematuria or nocturia  Musculoskeletal ROS: negative for - joint pain, joint stiffness or joint swelling  Neurological ROS: negative for - confusion, dizziness, headaches, memory loss or speech problems    Physical Exam:     Filed Vitals:    04/25/14 1100   BP: 191/82   Pulse: 57   Temp:    Resp: 30   SpO2: 92%       General appearance - alert, well appearing, and in no distress  Mental status - alert, oriented to person, place, and time  Eyes - pupils equal and reactive, extraocular eye movements intact  Nose - normal and patent, no erythema, discharge or polyps  Mouth - mucous membranes moist, pharynx normal without lesions  Neck - supple, no significant adenopathy  Lymphatics - no palpable lymphadenopathy, no hepatosplenomegaly  Chest - clear to auscultation, no wheezes, rales or rhonchi, symmetric air entry  Heart - normal rate, regular rhythm, normal S1, S2, no murmurs, rubs, clicks or gallops  Abdomen - soft, nontender, nondistended, no masses or organomegaly  Neurological - alert, oriented, normal speech, no focal findings or movement disorder noted  Musculoskeletal - no joint tenderness, deformity or swelling  Extremities - peripheral pulses normal, no pedal edema, no clubbing or cyanosis  Skin -  Pos  wound base clean     Medications:     Current Facility-Administered Medications   Medication Dose Route Frequency   . aspirin EC  325 mg Oral Daily   . atorvastatin  10 mg Oral QHS   . calcitRIOL  0.25 mcg Oral Daily   . docusate sodium  100 mg Oral Daily   . gabapentin  300 mg Oral BID   . heparin (porcine)  5,000 Units Subcutaneous Q12H [facility]   . hydrALAZINE  50 mg Oral Q8H   . venlafaxine  75 mg Oral BID       Intake and Output Summary (Last 24 hours) at Date Time    Intake/Output Summary (Last 24 hours) at 04/25/14 1335  Last data filed at 04/25/14 1100   Gross per 24 hour   Intake   1300 ml   Output   1320 ml   Net    -20 ml           Labs:       Recent Labs  Lab 04/25/14  0414 04/24/14  0332 04/23/14  1041  04/22/14  1409   SODIUM 135* 133* 134* More results in Results Review 135*   POTASSIUM 4.9 4.9 5.3* More results in Results Review 5.2*   CHLORIDE 109 107 106 More results in Results Review 108   CO2 18* 18* 17* More results in Results Review 18*   BUN 89* 96* 102* More results in Results  Review 91*   CREATININE 3.4* 3.7* 3.8* More results in Results Review 3.5*   CALCIUM 8.6 8.2* 8.0* More results in Results Review 8.5   ALBUMIN  --   --   --   --  2.9*   PROTEIN, TOTAL  --   --   --   --  6.6   BILIRUBIN, TOTAL  --   --   --   --  0.3   ALKALINE PHOSPHATASE  --   --   --   --  170*   ALT  --   --   --   --  88*   AST (SGOT)  --   --   --   --  35*   GLUCOSE 123* 133* 105* More results in Results Review 228*   More results in Results Review = values in this interval not displayed.    Recent Labs  Lab 04/22/14  1409   WBC 4.94   HEMOGLOBIN 9.7*   HEMATOCRIT 30.5*   PLATELETS 222               Rads:     Radiology Results (24 Hour)     Procedure Component Value Units Date/Time    XR Chest AP Portable [[id number]] Collected:  04/25/14 4098    Order Status:  Completed Updated:  04/25/14 0923    Narrative:      INDICATION: Congestive heart failure    COMPARISON: 04/22/2014    FINDINGS:  A single radiograph of the  chest performed. Portable film.  The heart is enlarged in size. Median sternotomy  The mediastinal and hilar structures are within normal limits.  The lung fields demonstrates stable right pleural effusion. Adjacent  right lower lung subsegmental atelectasis or infiltrate.Marland Kitchen  No pneumothorax.    The  visualized osseous structures demonstrates no acute abnormality.      Impression:       Stable right lower lung effusion and adjacent infiltrate or  atelectasis.    Kinnie Feil, MD   04/25/2014 9:19 AM              Assessment:      The patient is a 64 year old female admitted with bradycardia, chronic  kidney disease, acute kidney injury, heart failure, cardiomyopathy,  diabetes, and history of chronic wound.     AKI      Plan:    Plan is to monitor the patient clinically, continue aspirin 325 mg, Lipitor  10 mg daily, Neurontin 300 mg b.i.d., hydralazine 50 mg every 8 hours.   Await probable pacemaker placement.        Signed by: Mechele Dawley, MD  04/25/2014  1:35 PM

## 2014-04-25 NOTE — Progress Notes (Signed)
Severe Sepsis Screen    Date: 04/25/2014 Time: 6:11 PM  Nurse Signature: Rozelle Logan    Exclusions:      Patients meeting the following criteria are excluded from screening:     []  Suspicion or diagnosis of sepsis is documented and until 72 hours after antibiotics started or last regimen change:   - If Yes, Date of Documented Sepsis:                                          - If Yes, Date of last change in antibiotics:                                           []  Surgery - No screening for 24 hours after surgery   - If Yes, Date of Surgery:                                           []  Arctic Sun hypothermia protocol- Resume screening when arctic sun complete   []  Comfort Care Orders- Do not resume screening    Did you check any of the boxes above?     [x]  No, Continue to section A   []  Yes, Stop Here, Patient Excluded from Sepsis Screening. If screening should resume in the future, place "sticky note to treatment team" with date/time of when screening should resume. Communicate patient excluded from screening due to Comfort Care Orders using "sticky note to treatment team."    A. Infection:      Does your patient have ONE or more of the following infection criteria?     []  Documented Infection - Does the patient have positive culture results (from blood, sputum, urine, etc)?   []  Anti-Infective Therapy - Is the patient receiving antibiotic, antifungal, or other anti-infective therapy?   []  Pneumonia - Is there documentation of pneumonia (X Ray, etc)?   []  WBC's - Have WBC's been found in normally sterile fluid (urine, CSF, etc.)?   []  Perforated Viscus - Does the patient have a perforated hollow organ (bowel)?    A.  Did you check any of the boxes above?     [x]  No, Stop Here and Sepsis Screen Negative   []  Yes, continue to section B    B. SIRS:      Does your patient have TWO or more of the following SIRS criteria?     []  Temperature - Is the patient's temperature: Temp: 98.2 F (36.8 C) (04/25/14  1600)   - Greater than or equal to 38.3 degrees C (greater than 100.9 degrees F)?   - Less than or equal to 36 degrees C (less than or equal to 96.8 degrees F)?    []  Heart Rate: Heart Rate: (!) 50 (04/25/14 1600)   - Is the patient's heart rate greater than or equal to 90 bpm?    []  Respiratory: Resp Rate: (!) 29 (04/25/14 1600)   - Is the patient's respiratory rate greater than 20?     []  WBC Count - Is the patient's WBC count:   Recent Labs  Lab 04/22/14  1409   WBC 4.94       -  Greater than or equal to 12,000/mm3 OR   - Less than or equal to 4,000/mm3 OR    - Are there greater than 10% immature neutrophils (bands)?     []  Glucose >140 without diabetes or on steroids?   Recent Labs  Lab 04/25/14  0414   GLUCOSE 123*         []  Significant edema is present?    B.  Did you check two or more of the boxes above?     []  No, Stop Here and Sepsis Screen Negative   []  Yes, contact Charge Nurse, continue to section C    C. ACUTE Organ Dysfunction:      Does your patient have ONE or more of the following organ dysfunction? (May need to wait for lab results for assessment - see below) Organ dysfunction must be a result of the sepsis NOT CHRONIC conditions.    []  Cardiovascular - Does the patient have a: BP: (!) 185/113 mmHg (04/25/14 1600)   - Systolic Blood Pressure less than or equal to 90 mmHg OR   - Systolic Blood Pressure has dropped 40 mmHg or more from baseline OR   - Mean Arterial Pressure less than or equal to 70 mmHg (for at least one hour despite fluid resuscitation OR   - require vasopressor support?     []  Respiratory - Does the patient have new hypoxia defined by any of the following?   - A sustained increase in oxygen requirements by at least 2L/min on NC or 28% FiO2 within the last 24 hrs OR   - A persistent decrease in oxygen saturation of greater than or equal to 5% lasting at least four or more hours and occurring within the last 24 hours     []  Renal - Does the patient have:   - low urine output  (e.g. Less than 0.5 mL/kg/HR for one hour despite adequate fluid resuscitation OR   - Increased creatinine (greater than 50% increase from baseline) OR   - require acute dialysis?     []  Hematologic - Does the patient have:   - Low platelet count (less than 100,000 mm3)   Recent Labs  Lab 04/22/14  1409   PLATELETS 222    OR   - INR/aPTT greater than upper limit of normal?   Recent Labs  Lab 04/22/14  1409   PT INR 1.3*    OR        []  Metabolic - Does the patient have a high lactate (plasma lactate greater than or equal to 2.4 mMol/L?        []  Hepatic - Are the patient's liver enzymes elevated (ALT greater than 72 IU/L or Total Bilirubin greater than 2 MG/dL)?   Recent Labs  Lab 04/22/14  1409   BILIRUBIN, TOTAL 0.3   ALT 88*         []  CNS - Does the patient have altered consciousness or reduced Glasgow Coma Scale?     Other Active Diagnoses that may be contributing to signs of end organ dysfunction (Ex. Chronic kidney disease, cirrhosis):   - ________________________________________________________________      C.  Did you check any of the boxes above?     []  No, Sepsis Screen Negative   []  YES:  A) Infection + B) SIRS + C) Acute Organ Dysfunction = Positive Screen for Severe Sepsis. Notify attending (house officer during off-hours).     Notify Attending/House Technical sales engineer and document in Complex Assessment under provider  notification   - Name of physician notified:                                           - Date/Time Notifiied:                                             Document actions:   _0  Lactate drawn   _1  Blood Cultures obtained   _2  Antibiotics initiated or modified   _3   IV Fluid administered 0.9% NS __________ mLs given   Nursing Comments/Narrative:    - ______________________________________________________________     The Surviving Sepsis Guidelines recommend the following interventions to be completed within one hour of a positive sepsis screen.   Obtain new blood cultures prior to  antibiotic administration if not done within the last 24 hours.   Obtain lactate level, if initial lactate > 62mol, repeat lactate in 2 hours for goal decrease 10-20%; If there is not a decrease call [facility], hospital  If SBP < 90 or MAP < 65 or lactate greater than 4 mmol/dl; Start 0.9% NS IV Fluid Bolus of 30 mL/Kg (minimum) to maintain MAP > 65    Initiate vasopressors for hypotension not responding to fluid resuscitation (1st line Norepinephrine 1 - 300 mcg/min IV) - Patient must be in CCU if requires vasopressor   Initiate or escalate antibiotic therapy   Goal urine output greater than/equal to 0.5 mL/kg/hr

## 2014-04-26 ENCOUNTER — Inpatient Hospital Stay: Payer: Medicare Other

## 2014-04-26 ENCOUNTER — Other Ambulatory Visit: Payer: BLUE CROSS/BLUE SHIELD

## 2014-04-26 LAB — BASIC METABOLIC PANEL
Anion Gap: 11 (ref 5.0–15.0)
BUN: 83 mg/dL — ABNORMAL HIGH (ref 7–19)
CO2: 16 mEq/L — ABNORMAL LOW (ref 22–29)
Calcium: 8.7 mg/dL (ref 8.5–10.5)
Chloride: 109 mEq/L (ref 100–111)
Creatinine: 3 mg/dL — ABNORMAL HIGH (ref 0.6–1.0)
Glucose: 182 mg/dL — ABNORMAL HIGH (ref 70–100)
Potassium: 5.3 mEq/L — ABNORMAL HIGH (ref 3.5–5.1)
Sodium: 136 mEq/L (ref 136–145)

## 2014-04-26 LAB — ECG 12-LEAD
Atrial Rate: 52 {beats}/min
Atrial Rate: 63 {beats}/min
Q-T Interval: 452 ms
Q-T Interval: 452 ms
QRS Duration: 92 ms
QRS Duration: 92 ms
QTC Calculation (Bezet): 424 ms
QTC Calculation (Bezet): 428 ms
R Axis: 68 degrees
R Axis: 71 degrees
T Axis: 171 degrees
T Axis: 181 degrees
Ventricular Rate: 53 {beats}/min
Ventricular Rate: 54 {beats}/min

## 2014-04-26 LAB — IRON PROFILE
Iron Saturation: 5 % — ABNORMAL LOW (ref 15–50)
Iron: 10 ug/dL — ABNORMAL LOW (ref 40–145)
TIBC: 198 ug/dL — ABNORMAL LOW (ref 265–497)
UIBC: 188 ug/dL (ref 126–382)

## 2014-04-26 LAB — GLUCOSE WHOLE BLOOD - POCT
Whole Blood Glucose POCT: 172 mg/dL — ABNORMAL HIGH (ref 70–100)
Whole Blood Glucose POCT: 179 mg/dL — ABNORMAL HIGH (ref 70–100)
Whole Blood Glucose POCT: 185 mg/dL — ABNORMAL HIGH (ref 70–100)
Whole Blood Glucose POCT: 200 mg/dL — ABNORMAL HIGH (ref 70–100)
Whole Blood Glucose POCT: 237 mg/dL — ABNORMAL HIGH (ref 70–100)
Whole Blood Glucose POCT: 242 mg/dL — ABNORMAL HIGH (ref 70–100)

## 2014-04-26 LAB — FERRITIN: Ferritin: 488.42 ng/mL — ABNORMAL HIGH (ref 4.60–204.00)

## 2014-04-26 LAB — MAGNESIUM: Magnesium: 2.7 mg/dL — ABNORMAL HIGH (ref 1.6–2.6)

## 2014-04-26 LAB — GFR: EGFR: 18.9

## 2014-04-26 LAB — HEMOLYSIS INDEX: Hemolysis Index: 8 (ref 0–18)

## 2014-04-26 MED ORDER — SODIUM CHLORIDE 0.45 % IV SOLN
INTRAVENOUS | Status: DC
Start: 2014-04-26 — End: 2014-04-26

## 2014-04-26 MED ORDER — SODIUM CHLORIDE 0.9 % IV SOLN
300.0000 mg | INTRAVENOUS | Status: DC
Start: 2014-04-26 — End: 2014-04-28
  Administered 2014-04-26 – 2014-04-28 (×3): 300 mg via INTRAVENOUS
  Filled 2014-04-26 (×4): qty 15

## 2014-04-26 MED ORDER — HYDRALAZINE HCL 25 MG PO TABS
100.0000 mg | ORAL_TABLET | Freq: Three times a day (TID) | ORAL | Status: DC
Start: 2014-04-26 — End: 2014-04-26
  Administered 2014-04-26: 100 mg via ORAL
  Filled 2014-04-26: qty 4

## 2014-04-26 MED ORDER — FUROSEMIDE 10 MG/ML IJ SOLN
40.0000 mg | Freq: Once | INTRAMUSCULAR | Status: DC
Start: 2014-04-26 — End: 2014-04-26

## 2014-04-26 MED ORDER — HYDRALAZINE HCL 25 MG PO TABS
100.0000 mg | ORAL_TABLET | Freq: Three times a day (TID) | ORAL | Status: DC
Start: 2014-04-26 — End: 2014-04-28
  Administered 2014-04-26 – 2014-04-28 (×6): 100 mg via ORAL
  Filled 2014-04-26 (×6): qty 4

## 2014-04-26 MED ORDER — FUROSEMIDE 10 MG/ML IJ SOLN
60.0000 mg | Freq: Once | INTRAMUSCULAR | Status: AC
Start: 2014-04-26 — End: 2014-04-26
  Administered 2014-04-26: 60 mg via INTRAVENOUS
  Filled 2014-04-26: qty 8

## 2014-04-26 MED ORDER — FUROSEMIDE 10 MG/ML IJ SOLN
40.0000 mg | Freq: Two times a day (BID) | INTRAMUSCULAR | Status: DC
Start: 2014-04-26 — End: 2014-04-28
  Administered 2014-04-26 – 2014-04-27 (×3): 40 mg via INTRAVENOUS
  Filled 2014-04-26 (×5): qty 4

## 2014-04-26 NOTE — Plan of Care (Signed)
Problem: Pain  Goal: Patient's pain/discomfort is manageable  Outcome: Progressing  Patient denied pain all shift even with dressing change.     Problem: Psychosocial and Spiritual Needs  Goal: Demonstrates ability to cope with hospitalization/illness  Outcome: Progressing  Alert and oriented, cooperative with care and meds. Patient felt nausea this A.M. After meds given in empty stomach. Phenergan given with positive effect.     Problem: Moderate/High Fall Risk Score >/=15  Goal: Patient will remain free of falls  Fall prevention safety plan reviewed with patient, she verbalized understanding. Bed alarm on, call bell within reach, hourly rounding in progress.     Problem: Potential for Compromised Skin Integrity  Goal: Skin integrity is maintained or improved  Assess and monitor skin integrity. Identify patients at risk for skin breakdown on admission and per policy. Collaborate with interdisciplinary team and initiate plans and interventions as needed.   Outcome: Progressing  Skin integrity maintained. Patient was able to turn self, but needs reminders.   OOB to chair and commode several times.   Dry dressing to RLL, foul smell moderate serous drainage noted. Per wound nurse, wound MD was consulted and will be in to assess the wound.   Goal: Nutritional status is improving  Monitor and assess patient for malnutrition (ex- brittle hair, bruises, dry skin, pale skin and conjunctiva, muscle wasting, smooth red tongue, and disorientation). Collaborate with interdisciplinary team and initiate plan and interventions as ordered. Monitor patient's weight and dietary intake as ordered or per policy. Utilize nutrition screening tool and intervene per policy. Determine patient's food preferences and provide high-protein, high-caloric foods as appropriate.   Outcome: Progressing  Poor appetite, patient ate less than 25% of all meals. Encouraged fluid intake.     Problem: Tissue integrity  Goal: Damaged tissue is healing and  protected  Outcome: Progressing    Problem: Hemodynamic Status: Cardiac  Goal: Stable vital signs and fluid balance  Outcome: Progressing  On junctional rhythm, HR remained in the 40-50's.

## 2014-04-26 NOTE — Progress Notes (Signed)
PROGRESS NOTE    Date Time: 04/26/2014 9:12 PM  Patient Name: [name], [name]      Subjective:    The patient is a 64 year old female admitted with bradycardia.    No chest pain, some mild dyspnea.  No hypoxemia.          Review of Systems:   A comprehensive review of systems was: General ROS: negative for - chills, fever or night sweats  ENT ROS: negative for - headaches, nasal congestion, sinus pain or visual changes  Respiratory ROS: negative for - cough, orthopnea, shortness of breath or wheezing  Cardiovascular ROS: negative for - chest pain, dyspnea on exertion, orthopnea or shortness of breath  Gastrointestinal ROS: negative for - abdominal pain, constipation, diarrhea or nausea/vomiting  Genito-Urinary ROS: negative for - change in urinary stream, dysuria, hematuria or nocturia  Musculoskeletal ROS: negative for - joint pain, joint stiffness or joint swelling  Neurological ROS: negative for - confusion, dizziness, headaches, memory loss or speech problems    Physical Exam:     Filed Vitals:    04/26/14 2000   BP:    Pulse:    Temp: 97.3 F (36.3 C)   Resp:    SpO2:        General appearance - alert, well appearing, and in no distress  Mental status - alert, oriented to person, place, and time  Eyes - pupils equal and reactive, extraocular eye movements intact  Nose - normal and patent, no erythema, discharge or polyps  Mouth - mucous membranes moist, pharynx normal without lesions  Neck - supple, no significant adenopathy  Lymphatics - no palpable lymphadenopathy, no hepatosplenomegaly  Chest - clear to auscultation, no wheezes, rales or rhonchi, symmetric air entry  Heart - normal rate, regular rhythm, normal S1, S2, no murmurs, rubs, clicks or gallops  Abdomen - soft, nontender, nondistended, no masses or organomegaly  Neurological - alert, oriented, normal speech, no focal findings or movement disorder noted  Musculoskeletal - no joint tenderness, deformity or swelling  Extremities - peripheral pulses  normal, no pedal edema, no clubbing or cyanosis  Skin -  Pos wound base clean     Medications:     Current Facility-Administered Medications   Medication Dose Route Frequency   . aspirin EC  325 mg Oral Daily   . atorvastatin  10 mg Oral QHS   . calcitRIOL  0.25 mcg Oral Daily   . docusate sodium  100 mg Oral Daily   . furosemide  40 mg Intravenous BID   . gabapentin  300 mg Oral BID   . heparin (porcine)  5,000 Units Subcutaneous Q12H [facility]   . hydrALAZINE  100 mg Oral Q8H [facility]   . iron sucrose  300 mg Intravenous Q24H [facility]   . levofloxacin  250 mg Intravenous Q24H   . NIFEdipine ER  60 mg Oral Daily   . sodium bicarbonate  650 mg Oral BID   . venlafaxine  75 mg Oral BID       Intake and Output Summary (Last 24 hours) at Date Time    Intake/Output Summary (Last 24 hours) at 04/26/14 2112  Last data filed at 04/26/14 2000   Gross per 24 hour   Intake    510 ml   Output    950 ml   Net   -440 ml           Labs:       Recent Labs  Lab 04/26/14  0430  04/25/14  0414 04/24/14  0332  04/22/14  1409   SODIUM 136 135* 133* More results in Results Review 135*   POTASSIUM 5.3* 4.9 4.9 More results in Results Review 5.2*   CHLORIDE 109 109 107 More results in Results Review 108   CO2 16* 18* 18* More results in Results Review 18*   BUN 83* 89* 96* More results in Results Review 91*   CREATININE 3.0* 3.4* 3.7* More results in Results Review 3.5*   CALCIUM 8.7 8.6 8.2* More results in Results Review 8.5   ALBUMIN  --   --   --   --  2.9*   PROTEIN, TOTAL  --   --   --   --  6.6   BILIRUBIN, TOTAL  --   --   --   --  0.3   ALKALINE PHOSPHATASE  --   --   --   --  170*   ALT  --   --   --   --  88*   AST (SGOT)  --   --   --   --  35*   GLUCOSE 182* 123* 133* More results in Results Review 228*   More results in Results Review = values in this interval not displayed.    Recent Labs  Lab 04/25/14  1816   WBC 6.27   HEMOGLOBIN 9.6*   HEMATOCRIT 30.8*   PLATELETS 208               Rads:     Radiology Results (24 Hour)     Procedure  Component Value Units Date/Time    XR Chest 2 Views [[id number]] Collected:  04/26/14 4098    Order Status:  Completed Updated:  04/26/14 0927    Narrative:      INDICATION: Shortness of breath recent lung infiltrate    COMPARISON: 04/23/2013    FINDINGS: PA and lateral views of the chest were obtained.   The heart is enlarged in size. Median sternotomy  The  mediastinum and hilar structures are unremarkable.   The lung fields demonstrate some improvement in the right lower lung  infiltrate. Infiltrate and moderate size right effusion remain. New left  perihilar infiltrate. No pneumothorax.  The visualized osseous structures demonstrate no acute abnormality.       Impression:       New left perihilar infiltrate. Question aspiration or fluid  overload. Improving right lower lung infiltrate. Stable right effusion    Kinnie Feil, MD   04/26/2014 9:23 AM              Assessment:      The patient is a 64 year old female admitted with bradycardia, chronic  kidney disease, acute kidney injury, heart failure, cardiomyopathy,  diabetes, and history of chronic wound.     AKI      Plan:     Plan is to continue empiric IV antibiotics.  The patient is currently  receiving Lasix for volume overload, hydralazine for blood pressure  control, IV Venofer for anemia, sodium bicarbonate for renal failure.  We  will monitor the patient's electrolytes.  We will discuss further treatment  with cardiology.  We will plan for possible pacemaker when the patient is  medically stable.    .        Signed by: Mechele Dawley, MD  04/26/2014  9:12 PM

## 2014-04-26 NOTE — PT Progress Note (Signed)
[facility]  [address]  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    Assessment   Pt limited by pain, but by her admission also by effects of pain meds (lethargy, incoordination).    Plan   Recommendation  Discharge Recommendation: Home with supervision, Home with home health PT, Acute Rehab  PT Frequency: 3-4x/wk    If pt goes to [facility] for Pacemaker tomorrow,  recommended d/c disposition thereafter is Acute Rehab.    Continue plan of care.    Interdisciplinary Communication: Nursing made aware of session outcome.    Subjective     Patient is agreeable to participation in the therapy session. Nursing clears patient for therapy.  Pain: 8/10  Location: right foot, all over  Therapist Intervention: positioning, WB reduction  Patient is satisfied with therapist intervention.    Vitals: HR  48-51, BP 128/59-115/83, SpO2 92-90% on 6L/min  Filed Vitals:    04/26/14 1400 04/26/14 1500 04/26/14 1600 04/26/14 1700   BP: 152/67 128/59 136/95 124/57   Pulse: 51 47 48 48   Temp:   97.3 F (36.3 C)    TempSrc:   Oral    Resp: 20 19 19 20    Height:       Weight:       SpO2: 93% 94% 94% 94%       Objective     Precautions/ Contraindications:  Contact isolation, Skin    Patient is in bed with  Telemetry, Intravenous (IV), Pulse Oximeter , O2 at 6 liters/minute via NC , Sequential Compression Device  (SCD), Blood Pressure Cuff and Dressing in place. Placed humidification on O2 with nursing clearance to prevent nasal/sinus drying.    Therapeutic exercises:  Ankle Pumps: 10  Quad Sets: 10  Glut Sets: 10  Heel Slides: 10  Hip abduction/adduction: 10  Straight leg raise: 10  Short Arc Quad: 10  Long Arc Quad: 10    Functional Mobility:  Rolling: supv  Supine to Sit: CG  Scooting: Supv  Sit to Supine: CG for RLE  Sit to Stand: Min A  Stand to Sit: CG  Transfer Training: on/off EOB    Gait:   WB status: WBAT RLE  Assistive Device: none  Assist Level: min A-CG  Distance: 5 steps   Pattern: narrow BOS, decreased clearance, decreased cadence     Balance Training:  Good seated, poor-fair standing    Educated the patient to role of physical therapy, plan of care, goals  of therapy and HEP, safety with mobility and ADLs, energy conservation techniques, pursed lip breathing, weight bearing precautions for RLE pain control/healing.    Patient left in bed with all needs met, equipment intact and call bell within reach. RN notified of session outcome.   Mobility status  posted at bedside and within E.M.R.    Goals per Eval/ Re-eval:   Goals  Goal Formulation: With patient  Time for Goal Acheivement: By time of discharge  Goals: Select goal  Pt Will Go Supine To Sit: independent, to maximize functional mobility and independence  Pt Will Perform Sit to Stand: modified independent, to maximize functional mobility and independence  Pt Will Ambulate: 101-150 feet, with four wheel walker, with supervision, to maximize functional mobility and independence  Pt Will Go Up / Down Stairs: 1-2 stairs, with supervision, With rail    Signature:  Chester Holstein, PT  04/26/2014 6:14 PM   Phone: [phone]

## 2014-04-26 NOTE — Progress Notes (Signed)
Case Management Initial Discharge Planning Assessment     "Social History:  Lives with daughter in a house. Son able to assist.  Entry Steps: 1 Rails: no Inside steps: 0   Equipment at home: [facility], RW, scooter   Prior Level of Function:   Cognition: intact   Mobility: independent with household ambulation, scooter for distances  Feeding: independent  Grooming: independent  Bathing: independent  Dressing: independent  Toileting: independent"    Patient is a Medicare focused diagnosis patient? Yes/No No   Patient is a Cardiac Bundle patient? Yes/No nO   Patient is a 30 day INPATIENT readmission (current inpatient admission and another inpatient admission within 30 days)? Yes/No nO      EPIC Documentation  CM Assessment complete button selected in CM Navigator? Yes/No YES   Readmissions flowsheet completed in CM Navigator if patient is readmission? Yes/No N/a       Psychosocial/Demographic Information   Name of interviewee/s:  [name]   Orientation and decision making abilities of patient (ie a&ox3 able to make decisions, demented patient, patient on vent, etc)   Aox3   Does the patient have an Advance Directive? Location? (home/on chart, if home-advised to bring in copy?) <no information>  Advance Directive: Patient does not have advance directive]   Healthcare Decision Maker (HDM) (if other than the patient) Include relationship and contact information.      Any additional emergency contacts? Extended Emergency Contact Information  Primary Emergency Contact: [name] [place]  Home Phone: [phone]  Mobile Phone: [phone]  Relation: Daughter   Pt lives with:  Living Arrangements: Family members]   Type of residence where patient lives:    ]see above   ]   Prior level of functioning (ambulation & ADL's)  See above   Support system-list  (i.e.church, friends, extended family, friends?) family   Do you want to designate an individual who will care for or assist you upon discharge? yes   If  yes: Please list the name, relationship, phone number, and address of the designated individual. Name:[name]  Relationship:daughter  Phone Number:  Address:       Correct Insurance listed on face sheet - verified with the patient/HDM  yes      Discharge Planning Services in Place  Name of Primary Care Physician verified in patient banner (update in patient banner if not listed) Karl Ito, MD  [phone]   What DME does the patient currently own? (rolling walker, hospital bed, home O2, BiPAP/CPAP, bedside commode, cane, hoyer lift)  ]   ]see above   ]   Are PT/OT services indicated? If so, has it been ordered?  yes   Has the patient been to an Acute Rehab or SNF in the past?  If so, where?   no   Does the patient currently have home health or hospice/palliative services in place?  If so, list agency name.   Yes: does not remember the name but is in "[facility]"   Does the patient already have community dialysis set up?  If so, where?  no      Readmission Assessment  What is the current LACE score?  12   Is this patient an inpatient to inpatient 30 day readmission? no   Previous admission discharge diagnosis     Was patient readmitted from a facility?        Patient active with Home Health? Yes: see above   Patient active with Home Hospice?  no   Contributing factors to readmission (i.e., no follow up appt on previous d/c, unable to get meds, no insurance, no social support, etc.)    Did patient/family understand what medication was for and how to administer, symptoms to indicate worsening condition, activity and diet restrictions at time of previous d/c?                Anticipated Discharge Plan  Anticipated Disposition: Option A  TBD pending?pacemaker   Anticipated Disposition: Option B     Who will transport the patient upon discharge? TBD   If applicable, were SNF or Hospice choices provided? TBD   Palliative Care Consult needed? (if yes, contact attending MD) yes      Medicare/Medicare HMO  Patients Only  If this patient is inpatient, was an initial IMM signed within 24 hours of admission? (Look in media tab, documents table or shadow chart)  yes     Is this patient identified as a Medicare focused diagnosis or readmission? Yes/No no

## 2014-04-26 NOTE — PT Progress Note (Signed)
Physical Therapy Note    Per nursing patient just vomited. Requested PT to check back later. Will f/u as schedule permits.  Emi Belfast, PT  04/26/2014  12:19 PM  [phone]

## 2014-04-26 NOTE — Plan of Care (Signed)
Problem: Health Promotion  Goal: Knowledge - disease process  Extent of understanding conveyed about a specific disease process.   Outcome: Progressing  Goal: Knowledge - health resources  Extent of understanding and conveyed about healthcare resources.   Outcome: Progressing    Problem: Safety  Goal: Patient will be free from injury during hospitalization  Outcome: Progressing    Problem: Pain  Goal: Patient's pain/discomfort is manageable  Outcome: Progressing    Problem: Psychosocial and Spiritual Needs  Goal: Demonstrates ability to cope with hospitalization/illness  Outcome: Progressing    Problem: Moderate/High Fall Risk Score >/=15  Goal: Patient will remain free of falls  Outcome: Progressing    Problem: Potential for Compromised Skin Integrity  Goal: Skin integrity is maintained or improved  Assess and monitor skin integrity. Identify patients at risk for skin breakdown on admission and per policy. Collaborate with interdisciplinary team and initiate plans and interventions as needed.   Outcome: Progressing  Goal: Nutritional status is improving  Monitor and assess patient for malnutrition (ex- brittle hair, bruises, dry skin, pale skin and conjunctiva, muscle wasting, smooth red tongue, and disorientation). Collaborate with interdisciplinary team and initiate plan and interventions as ordered. Monitor patient's weight and dietary intake as ordered or per policy. Utilize nutrition screening tool and intervene per policy. Determine patient's food preferences and provide high-protein, high-caloric foods as appropriate.   Outcome: Progressing    Problem: Tissue integrity  Goal: Damaged tissue is healing and protected  Outcome: Progressing  Goal: Nutritional Status Improving  Outcome: Progressing    Problem: Urinary Incontinence  Goal: Perineal skin integrity is maintained or improved  Assess genitourinary system, perineal skin, labs (urinalysis), and history of incontinence to include past management,  aggravating, and alleviating factors. Collaborate with interdisciplinary team and initiate plans and interventions as needed.   Outcome: Progressing    Problem: Hemodynamic Status: Cardiac  Goal: Stable vital signs and fluid balance  Outcome: Progressing    Problem: Inadequate Tissue Perfusion  Goal: Adequate tissue perfusion will be maintained  Outcome: Progressing

## 2014-04-26 NOTE — Treatment Plan (Addendum)
Severe Sepsis Screen    Date: 04/26/2014 Time: 7:58 PM  Nurse Signature: Darin Engels    Exclusions:      Patients meeting the following criteria are excluded from screening:     []  Suspicion or diagnosis of sepsis is documented and until 72 hours after antibiotics started or last regimen change:   - If Yes, Date of Documented Sepsis:                                          - If Yes, Date of last change in antibiotics:                                           []  Surgery - No screening for 24 hours after surgery   - If Yes, Date of Surgery:                                           []  Arctic Sun hypothermia protocol- Resume screening when arctic sun complete   []  Comfort Care Orders- Do not resume screening    Did you check any of the boxes above?     [x]  No, Continue to section A   []  Yes, Stop Here, Patient Excluded from Sepsis Screening. If screening should resume in the future, place "sticky note to treatment team" with date/time of when screening should resume. Communicate patient excluded from screening due to Comfort Care Orders using "sticky note to treatment team."    A. Infection:      Does your patient have ONE or more of the following infection criteria?     []  Documented Infection - Does the patient have positive culture results (from blood, sputum, urine, etc)?   []  Anti-Infective Therapy - Is the patient receiving antibiotic, antifungal, or other anti-infective therapy?   []  Pneumonia - Is there documentation of pneumonia (X Ray, etc)?   []  WBC's - Have WBC's been found in normally sterile fluid (urine, CSF, etc.)?   []  Perforated Viscus - Does the patient have a perforated hollow organ (bowel)?    A.  Did you check any of the boxes above?     [x]  No, Stop Here and Sepsis Screen Negative   []  Yes, continue to section B    B. SIRS:      Does your patient have TWO or more of the following SIRS criteria?     []  Temperature - Is the patient's temperature: Temp: 97.3 F (36.3 C) (04/26/14  1600)   - Greater than or equal to 38.3 degrees C (greater than 100.9 degrees F)?   - Less than or equal to 36 degrees C (less than or equal to 96.8 degrees F)?    []  Heart Rate: Heart Rate: (!) 48 (04/26/14 1900)   - Is the patient's heart rate greater than or equal to 90 bpm?     []  Respiratory: Resp Rate: 20 (04/26/14 1900)   - Is the patient's respiratory rate greater than 20?     []  WBC Count - Is the patient's WBC count:   Recent Labs  Lab 04/25/14  1816   WBC 6.27       -  Greater than or equal to 12,000/mm3 OR   - Less than or equal to 4,000/mm3 OR    - Are there greater than 10% immature neutrophils (bands)?     []  Glucose >140 without diabetes or on steroids?   Recent Labs  Lab 04/26/14  0430   GLUCOSE 182*         []  Significant edema is present?    B.  Did you check two or more of the boxes above?     [x]  No, Stop Here and Sepsis Screen Negative   []  Yes, contact Charge Nurse, continue to section C    C. ACUTE Organ Dysfunction:      Does your patient have ONE or more of the following organ dysfunction? (May need to wait for lab results for assessment - see below) Organ dysfunction must be a result of the sepsis NOT CHRONIC conditions.     []  Cardiovascular - Does the patient have a: BP: 147/65 mmHg (04/26/14 1900)   - Systolic Blood Pressure less than or equal to 90 mmHg OR   - Systolic Blood Pressure has dropped 40 mmHg or more from baseline OR   - Mean Arterial Pressure less than or equal to 70 mmHg (for at least one hour despite fluid resuscitation OR   - require vasopressor support?     []  Respiratory - Does the patient have new hypoxia defined by any of the following?   - A sustained increase in oxygen requirements by at least 2L/min on NC or 28% FiO2 within the last 24 hrs OR   - A persistent decrease in oxygen saturation of greater than or equal to 5% lasting at least four or more hours and occurring within the last 24 hours     []  Renal - Does the patient have:   - low urine output (e.g.  Less than 0.5 mL/kg/HR for one hour despite adequate fluid resuscitation OR   - Increased creatinine (greater than 50% increase from baseline) OR   - require acute dialysis?     []  Hematologic - Does the patient have:   - Low platelet count (less than 100,000 mm3)   Recent Labs  Lab 04/25/14  1816   PLATELETS 208    OR   - INR/aPTT greater than upper limit of normal?   Recent Labs  Lab 04/22/14  1409   PT INR 1.3*    OR        []  Metabolic - Does the patient have a high lactate (plasma lactate greater than or equal to 2.4 mMol/L?        []  Hepatic - Are the patient's liver enzymes elevated (ALT greater than 72 IU/L or Total Bilirubin greater than 2 MG/dL)?   Recent Labs  Lab 04/22/14  1409   BILIRUBIN, TOTAL 0.3   ALT 88*         []  CNS - Does the patient have altered consciousness or reduced Glasgow Coma Scale?     Other Active Diagnoses that may be contributing to signs of end organ dysfunction (Ex. Chronic kidney disease, cirrhosis):   - ________________________________________________________________      C.  Did you check any of the boxes above?     []  No, Sepsis Screen Negative   []  YES:  A) Infection + B) SIRS + C) Acute Organ Dysfunction = Positive Screen for Severe Sepsis. Notify attending (house officer during off-hours).     Notify Attending/House Technical sales engineer and document in Complex Assessment under provider  notification   - Name of physician notified:                                           - Date/Time Notifiied:                                             Document actions:   _0  Lactate drawn   _1  Blood Cultures obtained   _2  Antibiotics initiated or modified   _3   IV Fluid administered 0.9% NS __________ mLs given   Nursing Comments/Narrative:    - ______________________________________________________________     The Surviving Sepsis Guidelines recommend the following interventions to be completed within one hour of a positive sepsis screen.   Obtain new blood cultures prior to antibiotic  administration if not done within the last 24 hours.   Obtain lactate level, if initial lactate > 65mol, repeat lactate in 2 hours for goal decrease 10-20%; If there is not a decrease call [facility], hospital  If SBP < 90 or MAP < 65 or lactate greater than 4 mmol/dl; Start 0.9% NS IV Fluid Bolus of 30 mL/Kg (minimum) to maintain MAP > 65    Initiate vasopressors for hypotension not responding to fluid resuscitation (1st line Norepinephrine 1 - 300 mcg/min IV) - Patient must be in CCU if requires vasopressor   Initiate or escalate antibiotic therapy   Goal urine output greater than/equal to 0.5 mL/kg/hr

## 2014-04-26 NOTE — Progress Notes (Signed)
Wound nurse Lurena Joiner was notified.

## 2014-04-26 NOTE — Progress Notes (Addendum)
Patient's right foot wound culture is positive for heavy growth MDR Proteus Mirabilis. Dr. Wilhelmina Mcardle was notified, order to inform the wound care nurse.

## 2014-04-26 NOTE — Progress Notes (Signed)
Severe Sepsis Screen    Date: 04/26/2014 Time: 8:17 AM  Nurse Signature: [name]    Exclusions:      Patients meeting the following criteria are excluded from screening:     []  Suspicion or diagnosis of sepsis is documented and until 72 hours after antibiotics started or last regimen change:   - If Yes, Date of Documented Sepsis:                                          - If Yes, Date of last change in antibiotics:                                           []  Surgery - No screening for 24 hours after surgery   - If Yes, Date of Surgery:                                           []  Arctic Sun hypothermia protocol- Resume screening when arctic sun complete   []  Comfort Care Orders- Do not resume screening    Did you check any of the boxes above?     [x]  No, Continue to section A   []  Yes, Stop Here, Patient Excluded from Sepsis Screening. If screening should resume in the future, place "sticky note to treatment team" with date/time of when screening should resume. Communicate patient excluded from screening due to Comfort Care Orders using "sticky note to treatment team."    A. Infection:      Does your patient have ONE or more of the following infection criteria?     []  Documented Infection - Does the patient have positive culture results (from blood, sputum, urine, etc)?   []  Anti-Infective Therapy - Is the patient receiving antibiotic, antifungal, or other anti-infective therapy?   []  Pneumonia - Is there documentation of pneumonia (X Ray, etc)?   []  WBC's - Have WBC's been found in normally sterile fluid (urine, CSF, etc.)?   []  Perforated Viscus - Does the patient have a perforated hollow organ (bowel)?    A.  Did you check any of the boxes above?     [x]  No, Stop Here and Sepsis Screen Negative   []  Yes, continue to section B    B. SIRS:      Does your patient have TWO or more of the following SIRS criteria?     []  Temperature - Is the patient's temperature: Temp: 97.3 F (36.3 C) (04/26/14  0800)   - Greater than or equal to 38.3 degrees C (greater than 100.9 degrees F)?   - Less than or equal to 36 degrees C (less than or equal to 96.8 degrees F)?    []  Heart Rate: Heart Rate: (!) 53 (04/26/14 0800)   - Is the patient's heart rate greater than or equal to 90 bpm?     []  Respiratory: Resp Rate: 21 (04/26/14 0800)   - Is the patient's respiratory rate greater than 20?     []  WBC Count - Is the patient's WBC count:   Recent Labs  Lab 04/25/14  1816   WBC 6.27       -  Greater than or equal to 12,000/mm3 OR   - Less than or equal to 4,000/mm3 OR    - Are there greater than 10% immature neutrophils (bands)?     []  Glucose >140 without diabetes or on steroids?   Recent Labs  Lab 04/26/14  0430   GLUCOSE 182*         []  Significant edema is present?    B.  Did you check two or more of the boxes above?     []  No, Stop Here and Sepsis Screen Negative   []  Yes, contact Charge Nurse, continue to section C    C. ACUTE Organ Dysfunction:      Does your patient have ONE or more of the following organ dysfunction? (May need to wait for lab results for assessment - see below) Organ dysfunction must be a result of the sepsis NOT CHRONIC conditions.    []  Cardiovascular - Does the patient have a: BP: (!) 171/115 mmHg (04/26/14 0800)   - Systolic Blood Pressure less than or equal to 90 mmHg OR   - Systolic Blood Pressure has dropped 40 mmHg or more from baseline OR   - Mean Arterial Pressure less than or equal to 70 mmHg (for at least one hour despite fluid resuscitation OR   - require vasopressor support?     []  Respiratory - Does the patient have new hypoxia defined by any of the following?   - A sustained increase in oxygen requirements by at least 2L/min on NC or 28% FiO2 within the last 24 hrs OR   - A persistent decrease in oxygen saturation of greater than or equal to 5% lasting at least four or more hours and occurring within the last 24 hours     []  Renal - Does the patient have:   - low urine output  (e.g. Less than 0.5 mL/kg/HR for one hour despite adequate fluid resuscitation OR   - Increased creatinine (greater than 50% increase from baseline) OR   - require acute dialysis?     []  Hematologic - Does the patient have:   - Low platelet count (less than 100,000 mm3)   Recent Labs  Lab 04/25/14  1816   PLATELETS 208    OR   - INR/aPTT greater than upper limit of normal?   Recent Labs  Lab 04/22/14  1409   PT INR 1.3*    OR        []  Metabolic - Does the patient have a high lactate (plasma lactate greater than or equal to 2.4 mMol/L?        []  Hepatic - Are the patient's liver enzymes elevated (ALT greater than 72 IU/L or Total Bilirubin greater than 2 MG/dL)?   Recent Labs  Lab 04/22/14  1409   BILIRUBIN, TOTAL 0.3   ALT 88*         []  CNS - Does the patient have altered consciousness or reduced Glasgow Coma Scale?     Other Active Diagnoses that may be contributing to signs of end organ dysfunction (Ex. Chronic kidney disease, cirrhosis):   - ________________________________________________________________      C.  Did you check any of the boxes above?     []  No, Sepsis Screen Negative   []  YES:  A) Infection + B) SIRS + C) Acute Organ Dysfunction = Positive Screen for Severe Sepsis. Notify attending (house officer during off-hours).     Notify Attending/House Technical sales engineer and document in Complex Assessment under provider  notification   - Name of physician notified:                                           - Date/Time Notifiied:                                             Document actions:   _0  Lactate drawn   _1  Blood Cultures obtained   _2  Antibiotics initiated or modified   _3   IV Fluid administered 0.9% NS __________ mLs given   Nursing Comments/Narrative:    - ______________________________________________________________     The Surviving Sepsis Guidelines recommend the following interventions to be completed within one hour of a positive sepsis screen.   Obtain new blood cultures prior to  antibiotic administration if not done within the last 24 hours.   Obtain lactate level, if initial lactate > 62mol, repeat lactate in 2 hours for goal decrease 10-20%; If there is not a decrease call [facility], hospital  If SBP < 90 or MAP < 65 or lactate greater than 4 mmol/dl; Start 0.9% NS IV Fluid Bolus of 30 mL/Kg (minimum) to maintain MAP > 65    Initiate vasopressors for hypotension not responding to fluid resuscitation (1st line Norepinephrine 1 - 300 mcg/min IV) - Patient must be in CCU if requires vasopressor   Initiate or escalate antibiotic therapy   Goal urine output greater than/equal to 0.5 mL/kg/hr

## 2014-04-26 NOTE — Progress Notes (Signed)
Patient just ate orange, which increased BS to 237, no insulin coverage because patient will be NPO tonight without fluid for pacemaker placement tomorrow morning. Will continue to monitor.

## 2014-04-26 NOTE — Progress Notes (Signed)
Pick up time scheduled for tomorrow morning at 0800. Daughter [name] was notified.

## 2014-04-26 NOTE — Progress Notes (Addendum)
Bari Edward, MD   [facility]  Tel:  [phone]      Assessment :   Patient is a 64 year old female with history of coronary artery disease status post coronary artery bypass grafting December 2015, nonhealing right lower extremity ulcer, hypertension, hyperlipidemia, chronic kidney disease, moderate AS, ICM LVEF 45% presents to the ED with complaints of fatigue, dizziness, found to be significantly bradycardic in junctional rhythm with Acute on CRI   Bradycardia:  Continues to have junctional rhythm with retrograde P waves . Off BB for 72 hours   Worsening SOB: Etiology ? Right LL pneumonia , concern about worsening pulmonary congestion with IV hydration.  Need to exclude pericardial effusion   CAD S/p prior PCI , recent CABGx2 02/2014: Currently stable   Chronic diastolic heart failure: Now with SOB .  Chest x-ray yesterday with right pleural effusion and infiltrate with no significant pulmonary vascular congestion.  Started him to be on antibiotics   Acute on chronic renal insufficiency, likely in the setting of significant bradycardia; Now with ATN.  Started on IVF yesterday   Poor healing ankle ulcer ( recent Peripheral angio no sig obstructive dz and hence not intervened upon   Ischemic cardiomyopathy, LVEF of 40 percent: Was on  beta blocker and hydralazine as an outpatient.  No ACEI, ARB due to  CRI. Now off BB   HTN: Accelerated.   Patient started on Procardia last night.  Has been getting IV hydralazine along with by mouth hydralazine and continues to have high blood pressure    Plan:   Increase hydralazine to 100 mg every 8 hours  Stop IVF  Stat chest x-ray to exclude pneumonia and Pul edema. (Started empirically on antibiotics)  Echo to exclude pericardial effusion( echo was not done yesterday, spoke to the echo tech personally this a.m.)  Pending the above,  patient will be scheduled for a permanent pacemaker placement( discussed the benefits and risk and patient is  agreeable)         9: 45 AM ; Chest Xray reviewed . Will give one dose of IV lasix and re-evaluate  (New left perihilar infiltrate. Question aspiration or fluid  overload. Improving right lower lung infiltrate. Stable right effusion)  Cardiology Diagnostics     Telemetry:  (I have personally reviewed the telemetry strips)  Junctional rhythm with retrograde P waves.      Echocardiogram: ( personally reviewed the report)02/2014  LVEF 45%, Mod AS,     Problem list     Patient Active Problem List   Diagnosis   . NSTEMI (non-ST elevated myocardial infarction)   . Hypertensive urgency   . PAD (peripheral artery disease)   . Arterial leg ulcer   . Chronic obstructive pulmonary disease, unspecified COPD type   . Ischemic cardiomyopathy   . CAD (coronary artery disease)   . Diabetes   . Chronic kidney disease   . Debilitated   . Complete heart block   . Junctional bradycardia   . Hyperkalemia          Events/ROS/ Subjective since last 24 hrs:         C/o worsening SOB    Objective:   Vitals reviewed     VS: BP 171/115 mmHg  Pulse 53  Temp(Src) 97.3 F (36.3 C) (Oral)  Resp 21  Ht 1.549 m (5\' 1" )  Wt 79.9 kg (176 lb 2.4 oz)  BMI 33.30 kg/m2  SpO2 93%    Intake/Output Summary (Last 24 hours) at  04/26/14 0819  Last data filed at 04/26/14 0600   Gross per 24 hour   Intake 1511.67 ml   Output   1170 ml   Net 341.67 ml       Constitutional : In respiratory distress distress  Respiratory:  Poor air movement and respiratory effort  bilaterally.  Some use of  accessory muscles.  Decreased BS right base Basilar rales R> left   Cardiovascular: Regular  S1 and S2.. 2/6 SM at the LSB No carotid bruits.  Cannot assess JVD  Dorsal pedis  Left feeble. Right bandaged. Trace edema b/l   Gastrointestinal Soft. Non-tender. Normoactive BS. No abdominal bruits  Psychiatric: AAO X3.  Normal mood and effect.    Laboratory Studies:   (I have personally reviewed the laboratory values below)      CBC w/Diff     Recent Labs  Lab 04/25/14  1816  04/22/14  1409   WBC 6.27 4.94   HEMOGLOBIN 9.6* 9.7*   HEMATOCRIT 30.8* 30.5*   PLATELETS 208 222          Basic Metabolic Profile     Recent Labs  Lab 04/26/14  0430 04/25/14  0414 04/24/14  0332   SODIUM 136 135* 133*   POTASSIUM 5.3* 4.9 4.9   CHLORIDE 109 109 107   CO2 16* 18* 18*   BUN 83* 89* 96*   CREATININE 3.0* 3.4* 3.7*   EGFR 18.9 16.4 14.9   GLUCOSE 182* 123* 133*   CALCIUM 8.7 8.6 8.2*            Cardiac Enzymes     Recent Labs  Lab 04/22/14  1407   TROPONIN I 0.03          Thyroid Studies     Recent Labs  Lab 04/24/14  0332   THYROID-STIMULATING HORMONE (TSH) BASELINE 5.74*          Cholesterol Panel            Coagulation Studies     Recent Labs  Lab 04/22/14  1409   PT 16.2*   PT INR 1.3*              Current Medications   Meds reviewed:    Current Facility-Administered Medications   Medication Dose Route Frequency   . aspirin EC  325 mg Oral Daily   . atorvastatin  10 mg Oral QHS   . calcitRIOL  0.25 mcg Oral Daily   . docusate sodium  100 mg Oral Daily   . gabapentin  300 mg Oral BID   . heparin (porcine)  5,000 Units Subcutaneous Q12H [facility]   . hydrALAZINE  75 mg Oral Q8H SCH   . levofloxacin  250 mg Intravenous Q24H   . NIFEdipine ER  60 mg Oral Daily   . sodium bicarbonate  650 mg Oral BID   . venlafaxine  75 mg Oral BID               More than 35 of CC time spent in the evaluation and Mx of this patient    D/w Nursing Staff , pt and Dr Wilhelmina Mcardle      Bari Edward, MD  04/26/2014 8:19 AM

## 2014-04-26 NOTE — Progress Notes (Signed)
Alert and oriented X 4, cooperative with care and meds. Patient denies pain all shift even with dressing change. She felt nauseous after 1000 meds given, PRN phenergan given with positive effect. Patient was started on  Hydralazine 100 mg TID for increased SBP. SBP at 118 at this time. Patient was started on Lasix, post chest-xray.   Cardiac cath scheduled for tomorrow morning at 10:00 at [facility]. Paperwork faxed to PTS. Will continue to monitor.

## 2014-04-26 NOTE — OT Eval Note (Signed)
[name]  [address]  [place], [place] [address]  [phone]    Occupational Therapy Evaluation    Patient: [name] MRN: [id number]   Unit: CCU CRITICAL CARE Bed: M602/M602-01    Time of treatment: Time Calculation  OT Received On: 04/26/14  Start Time: 1115  Stop Time: 1130  Time Calculation (min): 15 min    Consult received for [name] for OT evaluation and treatment.  Patient's medical condition is appropriate for Occupational Therapy  intervention at this time.    D/C Suggestions   Home with HHC vs Acute Rehab if goals not met.    Assessment     [name] is a 64 y.o. female admitted 04/22/2014. Pt's ability to complete ADLs and functional transfers is impaired due to the following deficits:  Nausea and vomitting, difficulty breathing, decreased activity tolerance, decreased strength, decreased balance.  Pt would continue to benefit from OT to address these deficits and increase independence with ADLs and functional transfers.         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 female admitted on  04/22/2014 with "history of coronary artery bypass  graft, moved here from [place], who presents to the emergency room with  complaints of dizziness and lethargy. The patient noted to  be bradycardiac  with heart rate in the 30s, external pacemaker placed. The patient has  received IV glucagon. The patient took her Coreg morning. She is being  admitted to CCU for further treatment and evaluation."      Patient Active Problem List   Diagnosis   . NSTEMI (non-ST elevated myocardial infarction)   . Hypertensive urgency   . PAD (peripheral artery disease)   . Arterial leg ulcer   . Chronic obstructive pulmonary disease, unspecified COPD type   . Ischemic cardiomyopathy   . CAD (coronary artery disease)   . Diabetes   . Chronic kidney disease   . Debilitated   . Complete heart block   . Junctional bradycardia   . Hyperkalemia     Past Medical History   Diagnosis Date   . Diabetes mellitus    . Chronic kidney disease    . Myocardial infarction    . Hypertension    . Hyperlipidemia    . Peripheral arterial disease      06/28/13 right superficial femoral artery stenosis, tibial peroneal trunk stenosis, left CVL a stenosis, treated with angiography and angioplasty.   . Coronary artery disease      Left circumflex artery presumed DES.,  Chronically occluded right coronary artery stent   . Anemia      She redid to chronic disease and iron deficiency.     . Arterial leg ulcer      2014, receiving home health   . Chronic obstructive pulmonary disease      Previously  on Spiriva and Advair     Past Surgical History   Procedure Laterality Date   . Cardiac angiography and angioplasty  06/2013   . Colonoscopy  Unknown   . Tubal ligation     . Coronary artery bypass N/A 03/04/2014     Procedure: CORONARY ARTERY BYPASS;  Surgeon: Madison Miles, MD;  Location: [name] HEART OR;  Service: Cardiovascular;  Laterality: N/A;  LIMA to LAD  SVG to OM   . Endoscopic,vein harvest Left 03/04/2014     Procedure: ENDOSCOPIC,VEIN HARVEST;  Surgeon: Madison Miles, MD;  Location: [facility] HEART OR;  Service: Cardiovascular;  Laterality: Left;  By D. [name], RNFA. Left groin to mid-calf.   Rhae Hammock N/A 03/04/2014     Procedure: TEE;   Surgeon: Madison Miles, MD;  Location: [facility] HEART OR;  Service: Cardiovascular;  Laterality: N/A;  probe [phone]     X-Rays/Tests/Labs:  XR Chest 2 Views (Order #[id number]) on 04/26/2014 - Imaging Information  IMPRESSION:   New left perihilar infiltrate. Question aspiration or fluid  overload. Improving right lower lung infiltrate. Stable right effusion    Results for [name], [name] (MRN [id number]) as of 04/26/2014 13:18   Ref. Range 04/25/2014 18:16   WBC Latest Ref Range: 3.50-10.80 x10 3/uL 6.27   Hemoglobin Latest Ref Range: 12.0-16.0 g/dL 9.6 (L)   Hematocrit Latest Ref Range: 37.0-47.0 % 30.8 (L)       Social History:  Lives with daughter in a house. Son able to assist.  Entry Steps: 1   Rails: no Inside steps: 0   Equipment at home: [facility], RW, scooter   Prior Level of Function:      Cognition: intact    Mobility: independent with household ambulation, scooter for distances   Feeding: independent   Grooming: independent   Bathing: independent   Dressing: independent   Toileting: independent       Subjective     Patient is agreeable to participation in the therapy session. Nursing clears patient for therapy.  Patient's Goal:  None stated  Pain: 0/10    Objective     Precautions: Falls, sternal, contact isolation    Patient is seated in a bedside chair with  Telemetry, Intravenous Access, O2 at 4 liters/minute via NC, and Dressing  in place.       Observation of patient/vitals:   Filed Vitals:    04/26/14 0800 04/26/14 1000 04/26/14 1100 04/26/14 1200   BP: 171/115 180/73 129/92 154/66   Pulse: 53 51 50 50   Temp: 97.3 F (36.3 C)   97.7 F (36.5 C)   TempSrc: Oral   Oral   Resp: 21 22 20 16    Height:       Weight:       SpO2: 93% 92% 93% 95%       Orientation/Cognition:     Alert and Oriented x 4  Cognition: WFL    Musculoskeletal Examination:   ROM Strength   RUE Shoulder to 90* due to sternal precautions, distal WFL Grossly 4-/5   LUE Shoulder to 90* due to sternal precautions, distal WFL Grossly 4-/5     Sensation:  Intact to light touch, localization, proprioception throughout B UE's.   Coordination: Intact gross motor and thumb to finger touch B hands   Vision: WFL  Hearing: WFL    Functional Mobility:  Sit to stand: min assist  Stand to sit: min assist  Transfers: min assist    Balance:  Static Sit Balance: good  Dynamic Sit Balance: good  Static Stand Balance: fair  Dynamic Stand Balance: fair-    Self Care:  Eating: set up assist  Grooming: set up assist  Bathing: max assist  UB Dressing: mod assist  LB Dressing: dependent  Toileting: NT    Endurance: poor    Participation:  Fair due to not feeling well, nausea with episode of vomitting    Education:  Educated the patient to role of occupational therapy, plan of care, goals  of therapy and safety with mobility and ADLs.    Patient is in bedside chair with needs met, and call bell within reach. RN notified of session outcome.  Mobility and ADL status posted at bedside.  Nurse notified of pt's c/o nausea and episode of vomiting.    Goals:  Goals  Goal Formulation: Patient  Time For Goal Achievement: 5 visits  Goals: Select goal  ADL Goals  Patient will dress upper body: Independent  Patient will dress lower body: Supervision  Pt will complete bathing: Supervision  Patient will toilet: Supervision  Mobility and Transfer Goals  Pt will perform functional transfers: Supervision  Executive Fucntion Goals  Pt will follow energy conservation techniques: independent;with verbalization of 3 ECTs to be used during ADLs      Signature:   Rosalia Hammers, OT  04/26/2014  1:15 PM  Phone: [phone]

## 2014-04-26 NOTE — Progress Notes (Addendum)
PROGRESS NOTE    Hospital Day: 4    Assessment:     Acute kidney injury.  Improving with gentle hydration.  However, her normal saline is causing volume overload symptoms  Chronic kidney disease stage IV.  Baseline  Hypertension with CKD. BP not control at this time   Metabolic acidosis.  Likely renal related.  Worsening  Anemia of chronic kidney disease.   Iron deficiency   Secondary hyperparathyroidism with Intact PTH high.  Bradycardia.    Plan:     Discontinue normal saline.  Lasix 60 mg IV 1 and then 40 mg twice a day  Avoid beta blockers at this time.  Start 300 mg IV iron sucrose 3 doses  Continue current blood pressure medication  Continue sodium bicarbonate 650 mg oral twice a day   Discussed with Dr. Betti Cruz in detail  We will follow the patient closely      Subjective:     Patient seen today in her room.  She is more sick than yesterday.  She feels short of breath and orthopneic.  However, her orthopnea improves when she is sitting in the bed    Review of Systems:   General:  no fever, no chills, no rigor, awake and alert, feeling better   HEENT: no neck pain, no throat pain  Endocrine: no fatigue   Respiratory: Coughing and feeling short of breath  Cardiovascular: no chest pain   Gastrointestinal: no abdominal pain,no N/V/D  Musculoskeletal: no edema  Neurological: no focal weakness  Dermatological: no rash, no ulcer    Physical Exam:     Filed Vitals:    04/26/14 1200   BP: 154/66   Pulse: 50   Temp: 97.7 F (36.5 C)   Resp: 16   SpO2: 95%       Intake and Output Summary (Last 24 hours) at Date Time    Intake/Output Summary (Last 24 hours) at 04/26/14 1247  Last data filed at 04/26/14 0600   Gross per 24 hour   Intake 1361.67 ml   Output   1050 ml   Net 311.67 ml         General: No acute distress.  HEENT: No pallor.  Neck: No jugular venous distension.  Chest: Bibasilar crackles  CVS: Regular rate and rhythm. Normal S1S2. No murmur or rub.  Abdomen: Nontender, non distended. Positive bowel sounds.  No  lower extremity edema.  No rash on limbs.     Scheduled Meds: Continuous Infusions:        aspirin EC 325 mg Oral Daily   atorvastatin 10 mg Oral QHS   calcitRIOL 0.25 mcg Oral Daily   docusate sodium 100 mg Oral Daily   furosemide 40 mg Intravenous BID   gabapentin 300 mg Oral BID   heparin (porcine) 5,000 Units Subcutaneous Q12H [facility]   hydrALAZINE 100 mg Oral Q8H SCH   levofloxacin 250 mg Intravenous Q24H   NIFEdipine ER 60 mg Oral Daily   sodium bicarbonate 650 mg Oral BID   venlafaxine 75 mg Oral BID                Labs:       Recent Labs  Lab 04/25/14  1816 04/22/14  1409   WBC 6.27 4.94   HEMOGLOBIN 9.6* 9.7*   HEMATOCRIT 30.8* 30.5*   PLATELETS 208 222       Recent Labs  Lab 04/26/14  0430 04/25/14  0414 04/24/14  0332  04/22/14  1409  SODIUM 136 135* 133* More results in Results Review 135*   POTASSIUM 5.3* 4.9 4.9 More results in Results Review 5.2*   CHLORIDE 109 109 107 More results in Results Review 108   CO2 16* 18* 18* More results in Results Review 18*   BUN 83* 89* 96* More results in Results Review 91*   CREATININE 3.0* 3.4* 3.7* More results in Results Review 3.5*   EGFR 18.9 16.4 14.9 More results in Results Review 15.9   GLUCOSE 182* 123* 133* More results in Results Review 228*   CALCIUM 8.7 8.6 8.2* More results in Results Review 8.5   ALBUMIN  --   --   --   --  2.9*   PHOSPHORUS  --  4.9*  --   --   --    More results in Results Review = values in this interval not displayed.      URINE TYPE   Date Value Ref Range Status   04/25/2014 Clean Catch  Final     COLOR, UA   Date Value Ref Range Status   04/25/2014 Yellow Clear - Yellow Final     CLARITY, UA   Date Value Ref Range Status   04/25/2014 Sl Cloudy* Clear - Hazy Final     SPECIFIC GRAVITY UA   Date Value Ref Range Status   04/25/2014 1.012 1.001-1.035 Final     URINE PH   Date Value Ref Range Status   04/25/2014 7.0 5.0-8.0 Final     NITRITE, UA   Date Value Ref Range Status   04/25/2014 Negative Negative Final     KETONES UA   Date  Value Ref Range Status   04/25/2014 Negative Negative Final     UROBILINOGEN, UA   Date Value Ref Range Status   04/25/2014 Negative 0.2  -  2.0 mg/dL Final     BILIRUBIN, UA   Date Value Ref Range Status   04/25/2014 Negative Negative Final     BLOOD, UA   Date Value Ref Range Status   04/25/2014 Negative Negative Final     RBC, UA   Date Value Ref Range Status   04/25/2014 0 - 5 0 - 5 /hpf Final     WBC, UA   Date Value Ref Range Status   04/25/2014 26 - 50* 0 - 5 /hpf Final       Rads:     Radiology Results (24 Hour)     Procedure Component Value Units Date/Time    XR Chest 2 Views [[id number]] Collected:  04/26/14 0865    Order Status:  Completed Updated:  04/26/14 0927    Narrative:      INDICATION: Shortness of breath recent lung infiltrate    COMPARISON: 04/23/2013    FINDINGS: PA and lateral views of the chest were obtained.   The heart is enlarged in size. Median sternotomy  The  mediastinum and hilar structures are unremarkable.   The lung fields demonstrate some improvement in the right lower lung  infiltrate. Infiltrate and moderate size right effusion remain. New left  perihilar infiltrate. No pneumothorax.  The visualized osseous structures demonstrate no acute abnormality.       Impression:       New left perihilar infiltrate. Question aspiration or fluid  overload. Improving right lower lung infiltrate. Stable right effusion    Kinnie Feil, MD   04/26/2014 9:23 AM      US Renal Kidney [[id number]] Collected:  04/25/14 1554  Order Status:  Completed Updated:  04/25/14 1610    Narrative:      Retroperitoneal ultrasound     CLINICAL HISTORY: AKI    TECHNIQUE: 2D and grayscale ultrasound imaging is performed with  attention to the kidneys and the pelvis. Examination is performed  portably and is technically challenging.    INTERPRETATION: The right and left kidneys measure 10.2 cm and 9.7 cm  respectively. There is no significant hydronephrosis. Echo somewhat  hyperechoic kidneys are noted. There is a  cyst in the upper pole left  kidney measuring 2.7 x 1.9 cm in diameter.     The bladder is incompletely filled . The pre-void volume is  302 cc.  Post void residual 79 cc or  26%; this is somewhat increased but of  borderline significance and questionable reliability given ICU setting  and portable exam    Incidental note is made of ascites.      Impression:      Impression: Somewhat increased echogenicity of kidneys. No evidence of  hydronephrosis. Ascites. Question increased post void residual but of  uncertain significance given portable examination.    Dara Lords, MD   04/25/2014 4:06 PM          Radiological Procedure reviewed.          Cristal Ford, MD  04/26/2014

## 2014-04-26 NOTE — Plan of Care (Signed)
Problem: Health Promotion  Goal: Knowledge - disease process  Extent of understanding conveyed about a specific disease process.   Outcome: Progressing  Discussed disease process with patient, reinforcement needed.    Problem: Safety  Goal: Patient will be free from injury during hospitalization  Outcome: Progressing  Patient remains free from injury, safe environment maintained, hourly rounding completed.    Problem: Pain  Goal: Patient's pain/discomfort is manageable  Outcome: Progressing  Patient remains pain free, pain assessment completed Q4 hours and PRN.    Problem: Psychosocial and Spiritual Needs  Goal: Demonstrates ability to cope with hospitalization/illness  Outcome: Progressing  Patient encouraged to voice concerns and feelings, quiet environment maintained.    Problem: Moderate/High Fall Risk Score >/=15  Goal: Patient will remain free of falls  Outcome: Progressing  Patient remains free from falls.    Problem: Potential for Compromised Skin Integrity  Goal: Skin integrity is maintained or improved  Assess and monitor skin integrity. Identify patients at risk for skin breakdown on admission and per policy. Collaborate with interdisciplinary team and initiate plans and interventions as needed.   Outcome: Progressing  Skin kept clean and dry, patient repositioned Q2 hours and PRN.  Goal: Nutritional status is improving  Monitor and assess patient for malnutrition (ex- brittle hair, bruises, dry skin, pale skin and conjunctiva, muscle wasting, smooth red tongue, and disorientation). Collaborate with interdisciplinary team and initiate plan and interventions as ordered. Monitor patient's weight and dietary intake as ordered or per policy. Utilize nutrition screening tool and intervene per policy. Determine patient's food preferences and provide high-protein, high-caloric foods as appropriate.   Outcome: Progressing  Patient's po intake adequate. Patient NPO after MN for possible pacemaker  placement.    Problem: Tissue integrity  Goal: Damaged tissue is healing and protected  Outcome: Progressing  Goal: Nutritional Status Improving  Outcome: Progressing    Problem: Urinary Incontinence  Goal: Perineal skin integrity is maintained or improved  Assess genitourinary system, perineal skin, labs (urinalysis), and history of incontinence to include past management, aggravating, and alleviating factors. Collaborate with interdisciplinary team and initiate plans and interventions as needed.   Outcome: Progressing  Patient's perineal area kept clean and dry.    Problem: Hemodynamic Status: Cardiac  Goal: Stable vital signs and fluid balance  Outcome: Progressing  Patient remains hemodynamically stable, HR remains in mid 60's.    Problem: Inadequate Tissue Perfusion  Goal: Adequate tissue perfusion will be maintained  Outcome: Progressing

## 2014-04-27 ENCOUNTER — Ambulatory Visit (INDEPENDENT_AMBULATORY_CARE_PROVIDER_SITE_OTHER): Payer: BLUE CROSS/BLUE SHIELD | Admitting: Cardiology

## 2014-04-27 ENCOUNTER — Inpatient Hospital Stay: Payer: Medicare Other

## 2014-04-27 LAB — ECG 12-LEAD
Atrial Rate: 35 {beats}/min
Atrial Rate: 35 {beats}/min
Atrial Rate: 60 {beats}/min
Q-T Interval: 548 ms
Q-T Interval: 570 ms
Q-T Interval: 432 ms
QRS Duration: 82 ms
QRS Duration: 84 ms
QRS Duration: 94 ms
QTC Calculation (Bezet): 418 ms
QTC Calculation (Bezet): 435 ms
QTC Calculation (Bezet): 434 ms
R Axis: 21 degrees
R Axis: 55 degrees
R Axis: 51 degrees
T Axis: 153 degrees
T Axis: 148 degrees
T Axis: 145 degrees
Ventricular Rate: 35 {beats}/min
Ventricular Rate: 35 {beats}/min
Ventricular Rate: 61 {beats}/min

## 2014-04-27 LAB — GLUCOSE WHOLE BLOOD - POCT
Whole Blood Glucose POCT: 184 mg/dL — ABNORMAL HIGH (ref 70–100)
Whole Blood Glucose POCT: 187 mg/dL — ABNORMAL HIGH (ref 70–100)
Whole Blood Glucose POCT: 194 mg/dL — ABNORMAL HIGH (ref 70–100)
Whole Blood Glucose POCT: 199 mg/dL — ABNORMAL HIGH (ref 70–100)
Whole Blood Glucose POCT: 208 mg/dL — ABNORMAL HIGH (ref 70–100)

## 2014-04-27 LAB — BASIC METABOLIC PANEL
Anion Gap: 12 (ref 5.0–15.0)
BUN: 96 mg/dL — ABNORMAL HIGH (ref 7–19)
CO2: 16 mEq/L — ABNORMAL LOW (ref 22–29)
Calcium: 8.9 mg/dL (ref 8.5–10.5)
Chloride: 107 mEq/L (ref 100–111)
Creatinine: 4 mg/dL — ABNORMAL HIGH (ref 0.6–1.0)
Glucose: 196 mg/dL — ABNORMAL HIGH (ref 70–100)
Potassium: 5.6 mEq/L — ABNORMAL HIGH (ref 3.5–5.1)
Sodium: 135 mEq/L — ABNORMAL LOW (ref 136–145)

## 2014-04-27 LAB — GFR: EGFR: 13.6

## 2014-04-27 MED ORDER — NIFEDIPINE ER OSMOTIC RELEASE 90 MG PO TB24
90.0000 mg | ORAL_TABLET | Freq: Every day | ORAL | Status: DC
Start: 2014-04-28 — End: 2014-04-28
  Administered 2014-04-28: 90 mg via ORAL
  Filled 2014-04-27: qty 1

## 2014-04-27 MED ORDER — INSULIN ASPART 100 UNIT/ML SC SOLN
1.0000 [IU] | Freq: Three times a day (TID) | SUBCUTANEOUS | Status: DC | PRN
Start: 2014-04-27 — End: 2014-04-28
  Administered 2014-04-27: 2 [IU] via SUBCUTANEOUS
  Administered 2014-04-28 (×2): 1 [IU] via SUBCUTANEOUS
  Filled 2014-04-27 (×2): qty 1
  Filled 2014-04-27: qty 2

## 2014-04-27 MED ORDER — DEXTROSE 50 % IV SOLN
25.0000 mL | INTRAVENOUS | Status: DC | PRN
Start: 2014-04-27 — End: 2014-04-28

## 2014-04-27 MED ORDER — BISACODYL 10 MG RE SUPP
10.0000 mg | Freq: Every day | RECTAL | Status: DC | PRN
Start: 2014-04-27 — End: 2014-04-28
  Administered 2014-04-27 – 2014-04-28 (×2): 10 mg via RECTAL
  Filled 2014-04-27 (×2): qty 1

## 2014-04-27 MED ORDER — GLUCAGON 1 MG IJ SOLR (WRAP)
1.0000 mg | INTRAMUSCULAR | Status: DC | PRN
Start: 2014-04-27 — End: 2014-04-28

## 2014-04-27 MED ORDER — FLUTICASONE-SALMETEROL 250-50 MCG/DOSE IN AEPB
1.0000 | INHALATION_SPRAY | Freq: Two times a day (BID) | RESPIRATORY_TRACT | Status: DC
Start: 2014-04-27 — End: 2014-04-28
  Administered 2014-04-27 – 2014-04-28 (×3): 1 via RESPIRATORY_TRACT
  Filled 2014-04-27: qty 14

## 2014-04-27 MED ORDER — SODIUM POLYSTYRENE SULFONATE 30 G/120 ML RECTAL SUSP
30.0000 g | Freq: Once | RECTAL | Status: AC
Start: 2014-04-27 — End: 2014-04-27
  Administered 2014-04-27: 30 g via RECTAL
  Filled 2014-04-27: qty 120

## 2014-04-27 NOTE — Progress Notes (Signed)
PROGRESS NOTE    Date Time: 04/27/2014 1:35 PM  Patient Name: [name], [name]      Subjective:    less dyspnea this morning           Review of Systems:   A comprehensive review of systems was: General ROS: negative for - chills, fever or night sweats  ENT ROS: negative for - headaches, nasal congestion, sinus pain or visual changes  Respiratory ROS: negative for - cough, orthopnea, shortness of breath or wheezing  Cardiovascular ROS: negative for - chest pain, dyspnea on exertion, orthopnea or shortness of breath  Gastrointestinal ROS: negative for - abdominal pain, constipation, diarrhea or nausea/vomiting  Genito-Urinary ROS: negative for - change in urinary stream, dysuria, hematuria or nocturia  Musculoskeletal ROS: negative for - joint pain, joint stiffness or joint swelling  Neurological ROS: negative for - confusion, dizziness, headaches, memory loss or speech problems    Physical Exam:     Filed Vitals:    04/27/14 1300   BP: 160/72   Pulse: 51   Temp:    Resp: 20   SpO2: 98%       General appearance - alert, well appearing, and in no distress  Mental status - alert, oriented to person, place, and time  Eyes - pupils equal and reactive, extraocular eye movements intact  Nose - normal and patent, no erythema, discharge or polyps  Mouth - mucous membranes moist, pharynx normal without lesions  Neck - supple, no significant adenopathy  Lymphatics - no palpable lymphadenopathy, no hepatosplenomegaly  Chest - clear to auscultation, no wheezes, rales or rhonchi, symmetric air entry  Heart - normal rate, regular rhythm, normal S1, S2, no murmurs, rubs, clicks or gallops  Abdomen - soft, nontender, nondistended, no masses or organomegaly  Neurological - alert, oriented, normal speech, no focal findings or movement disorder noted  Musculoskeletal - no joint tenderness, deformity or swelling  Extremities - peripheral pulses normal, no pedal edema, no clubbing or cyanosis  Skin -  Pos wound base clean     Medications:      Current Facility-Administered Medications   Medication Dose Route Frequency   . aspirin EC  325 mg Oral Daily   . atorvastatin  10 mg Oral QHS   . calcitRIOL  0.25 mcg Oral Daily   . docusate sodium  100 mg Oral Daily   . fluticasone-salmeterol  1 puff Inhalation BID   . furosemide  40 mg Intravenous BID   . gabapentin  300 mg Oral BID   . heparin (porcine)  5,000 Units Subcutaneous Q12H [facility]   . hydrALAZINE  100 mg Oral Q8H SCH   . iron sucrose  300 mg Intravenous Q24H [facility]   . levofloxacin  250 mg Intravenous Q24H   . NIFEdipine ER  60 mg Oral Daily   . sodium bicarbonate  650 mg Oral BID   . venlafaxine  75 mg Oral BID       Intake and Output Summary (Last 24 hours) at Date Time    Intake/Output Summary (Last 24 hours) at 04/27/14 1335  Last data filed at 04/27/14 1044   Gross per 24 hour   Intake    160 ml   Output    150 ml   Net     10 ml           Labs:       Recent Labs  Lab 04/27/14  0934 04/26/14  0430 04/25/14  0414  04/22/14  1409   SODIUM 135* 136 135* More results in Results Review 135*   POTASSIUM 5.6* 5.3* 4.9 More results in Results Review 5.2*   CHLORIDE 107 109 109 More results in Results Review 108   CO2 16* 16* 18* More results in Results Review 18*   BUN 96* 83* 89* More results in Results Review 91*   CREATININE 4.0* 3.0* 3.4* More results in Results Review 3.5*   CALCIUM 8.9 8.7 8.6 More results in Results Review 8.5   ALBUMIN  --   --   --   --  2.9*   PROTEIN, TOTAL  --   --   --   --  6.6   BILIRUBIN, TOTAL  --   --   --   --  0.3   ALKALINE PHOSPHATASE  --   --   --   --  170*   ALT  --   --   --   --  88*   AST (SGOT)  --   --   --   --  35*   GLUCOSE 196* 182* 123* More results in Results Review 228*   More results in Results Review = values in this interval not displayed.    Recent Labs  Lab 04/25/14  1816   WBC 6.27   HEMOGLOBIN 9.6*   HEMATOCRIT 30.8*   PLATELETS 208               Rads:     Radiology Results (24 Hour)     Procedure Component Value Units Date/Time    XR Chest 2  Views [[id number]] Collected:  04/27/14 0900    Order Status:  Completed Updated:  04/27/14 0908    Narrative:      CLINICAL INDICATION: dyspnea chf infiltrates post Lasix    COMPARISON: 04/26/2014    INTERPRETATION: Frontal and lateral views of the chest were obtained..   There is stable cardiomegaly. There is mildly improving interstitial  edema. There is a small right pleural effusion. Post sternotomy wires  are noted.      Impression:       Mildly improving interstitial edema. There is a stable small  right pleural effusion.     [name], MD   04/27/2014 9:04 AM              Assessment:      The patient is a 64 year old female admitted with bradycardia, chronic  kidney disease, acute kidney injury, heart failure, cardiomyopathy,  diabetes, and history of chronic wound.     AKI      Plan:      The chest x-ray done this morning shows resolving interstitial edema        Patient prepared for pacemaker placement at [facility].  Treat and monitor volume overload.  Follow the patient clinically.        Signed by: Mechele Dawley, MD  04/27/2014  1:35 PM

## 2014-04-27 NOTE — Progress Notes (Signed)
PROGRESS NOTE    Hospital Day: 5    Assessment:     Acute kidney injury.  Kidney function worsening since yesterday.  Likely secondary to diuretics effect or kidney disease progression  Hyperkalemia.  Likely secondary to worsening of the kidney disease  Chronic kidney disease stage IV.  Baseline  Congestive heart failure symptoms  Hypertension with CKD. BP not control at this time   Metabolic acidosis.  Likely renal related.  Worsening  Anemia of chronic kidney disease.   Iron deficiency   Secondary hyperparathyroidism with Intact PTH high.  Bradycardia.    Plan:     Continue Lasix 40 mg twice a day as well as radiological improvement on chest xray   Avoid beta blockers at this time.  Continue IV iron sucrose 3 doses  Increase nifedipine dose  Kayexalate oral for high potassium.  In addition, Lasix.  Will also help  Continue sodium bicarbonate 650 mg oral twice a day   Discussed with Dr. Betti Cruz in detail  We will follow the patient closely      Subjective:     Patient seen today in her room.  She continues to feel short of breath    Review of Systems:   General:  no fever, no chills, no rigor, awake and alert, feeling better   HEENT: no neck pain, no throat pain  Endocrine: no fatigue   Respiratory: Coughing and feeling short of breath  Cardiovascular: no chest pain   Gastrointestinal: no abdominal pain,no N/V/D  Musculoskeletal: has edema   Neurological: no focal weakness  Dermatological: no rash, no ulcer    Physical Exam:     Filed Vitals:    04/27/14 1300   BP: 160/72   Pulse: 51   Temp:    Resp: 20   SpO2: 98%       Intake and Output Summary (Last 24 hours) at Date Time    Intake/Output Summary (Last 24 hours) at 04/27/14 1402  Last data filed at 04/27/14 1044   Gross per 24 hour   Intake    160 ml   Output    150 ml   Net     10 ml         General: Looks sick and SOB.   HEENT: No pallor.  Neck: No jugular venous distension.  Chest: Bibasilar crackles  CVS: Regular rate and rhythm. Normal S1S2. No murmur or  rub.  Abdomen: Nontender, non distended. Positive bowel sounds.  No lower extremity edema.  No rash on limbs.     Scheduled Meds: Continuous Infusions:        aspirin EC 325 mg Oral Daily   atorvastatin 10 mg Oral QHS   calcitRIOL 0.25 mcg Oral Daily   docusate sodium 100 mg Oral Daily   fluticasone-salmeterol 1 puff Inhalation BID   furosemide 40 mg Intravenous BID   gabapentin 300 mg Oral BID   heparin (porcine) 5,000 Units Subcutaneous Q12H SCH   hydrALAZINE 100 mg Oral Q8H SCH   iron sucrose 300 mg Intravenous Q24H SCH   NIFEdipine ER 60 mg Oral Daily   sodium bicarbonate 650 mg Oral BID   venlafaxine 75 mg Oral BID                Labs:       Recent Labs  Lab 04/25/14  1816 04/22/14  1409   WBC 6.27 4.94   HEMOGLOBIN 9.6* 9.7*   HEMATOCRIT 30.8* 30.5*   PLATELETS 208 222  Recent Labs  Lab 04/27/14  0934 04/26/14  0430 04/25/14  0414  04/22/14  1409   SODIUM 135* 136 135* More results in Results Review 135*   POTASSIUM 5.6* 5.3* 4.9 More results in Results Review 5.2*   CHLORIDE 107 109 109 More results in Results Review 108   CO2 16* 16* 18* More results in Results Review 18*   BUN 96* 83* 89* More results in Results Review 91*   CREATININE 4.0* 3.0* 3.4* More results in Results Review 3.5*   EGFR 13.6 18.9 16.4 More results in Results Review 15.9   GLUCOSE 196* 182* 123* More results in Results Review 228*   CALCIUM 8.9 8.7 8.6 More results in Results Review 8.5   ALBUMIN  --   --   --   --  2.9*   PHOSPHORUS  --   --  4.9*  --   --    More results in Results Review = values in this interval not displayed.      URINE TYPE   Date Value Ref Range Status   04/25/2014 Clean Catch  Final     COLOR, UA   Date Value Ref Range Status   04/25/2014 Yellow Clear - Yellow Final     CLARITY, UA   Date Value Ref Range Status   04/25/2014 Sl Cloudy* Clear - Hazy Final     SPECIFIC GRAVITY UA   Date Value Ref Range Status   04/25/2014 1.012 1.001-1.035 Final     URINE PH   Date Value Ref Range Status   04/25/2014 7.0  5.0-8.0 Final     NITRITE, UA   Date Value Ref Range Status   04/25/2014 Negative Negative Final     KETONES UA   Date Value Ref Range Status   04/25/2014 Negative Negative Final     UROBILINOGEN, UA   Date Value Ref Range Status   04/25/2014 Negative 0.2  -  2.0 mg/dL Final     BILIRUBIN, UA   Date Value Ref Range Status   04/25/2014 Negative Negative Final     BLOOD, UA   Date Value Ref Range Status   04/25/2014 Negative Negative Final     RBC, UA   Date Value Ref Range Status   04/25/2014 0 - 5 0 - 5 /hpf Final     WBC, UA   Date Value Ref Range Status   04/25/2014 26 - 50* 0 - 5 /hpf Final       Rads:     Radiology Results (24 Hour)     Procedure Component Value Units Date/Time    XR Chest 2 Views [[id number]] Collected:  04/27/14 0900    Order Status:  Completed Updated:  04/27/14 0908    Narrative:      CLINICAL INDICATION: dyspnea chf infiltrates post Lasix    COMPARISON: 04/26/2014    INTERPRETATION: Frontal and lateral views of the chest were obtained..   There is stable cardiomegaly. There is mildly improving interstitial  edema. There is a small right pleural effusion. Post sternotomy wires  are noted.      Impression:       Mildly improving interstitial edema. There is a stable small  right pleural effusion.     [name], MD   04/27/2014 9:04 AM          Radiological Procedure reviewed.          Cristal Ford, MD  04/27/2014

## 2014-04-27 NOTE — Consults (Signed)
PODIATRIC SURGERY CONSULTATION  Spectra# [id number]/Pager#[phone]    Date Time: 04/27/2014 4:16 PM  Patient Name: [name]  Requesting Physician: Mechele Dawley, MD       Reason for Consultation:   Wound to the RLE.     History:   [name] is a 64 y.o. female who presents to the hospital on 04/22/2014 with wound to the RLE. Pt is well known to the wound care center and she was receiving care, periodic debridement. 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 Pt reports the wound been present for 2 years. Pt denies any consitutional symptoms at the current time. Reports labored breathing.     Past Medical History:     Past Medical History   Diagnosis Date   . Diabetes mellitus    . Chronic kidney disease    . Myocardial infarction    . Hypertension    . Hyperlipidemia    . Peripheral arterial disease      06/28/13 right superficial femoral artery stenosis, tibial peroneal trunk stenosis, left CVL a stenosis, treated with angiography and angioplasty.   . Coronary artery disease      Left circumflex artery presumed DES.,  Chronically occluded right coronary artery stent   . Anemia      She redid to chronic disease and iron deficiency.     . Arterial leg ulcer      2014, receiving home health   . Chronic obstructive pulmonary disease      Previously on Spiriva and Advair       Past Surgical History:     Past Surgical History   Procedure Laterality Date   . Cardiac angiography and angioplasty  06/2013   . Colonoscopy  Unknown   . Tubal ligation     . Coronary artery bypass N/A 03/04/2014     Procedure: CORONARY ARTERY BYPASS;  Surgeon: Austin Miles, MD;  Location: [facility] HEART OR;  Service: Cardiovascular;  Laterality: N/A;  LIMA to LAD  SVG to OM   . Endoscopic,vein harvest Left 03/04/2014     Procedure: ENDOSCOPIC,VEIN HARVEST;  Surgeon: Austin Miles, MD;  Location: [facility] HEART OR;  Service: Cardiovascular;  Laterality:  Left;  By D. Lazarus Gowda, RNFA. Left groin to mid-calf.   Rhae Hammock N/A 03/04/2014     Procedure: TEE;  Surgeon: Austin Miles, MD;  Location: [facility] HEART OR;  Service: Cardiovascular;  Laterality: N/A;  probe [id number]       Family History:     Family History   Problem Relation Age of Onset   . Coronary artery disease Mother      Died at 39 from MI.   Marland Kitchen Coronary artery disease Father      Died at 35 from MI   . Diabetes type II       Mother and father       Social History:     History     Social History   . Marital Status: Widowed     Spouse Name: N/A     Number of Children: N/A   . Years of Education: N/A     Social History Main Topics   . Smoking status: Former Smoker -- 1.00 packs/day for 20 years     Quit date: 02/29/2012   . Smokeless tobacco: Not on file   . Alcohol Use: Not on file   . Drug  Use: Not on file   . Sexual Activity: Not on file     Other Topics Concern   . Not on file     Social History Narrative       Allergies:     Allergies   Allergen Reactions   . Penicillins Anaphylaxis and Angioedema     Tolerates cephalosporins        Medications:     Prior to Admission medications    Medication Sig Start Date End Date Taking? Authorizing Provider   hydrALAZINE (APRESOLINE) 100 MG tablet Take 1 tablet (100 mg total) by mouth 3 (three) times daily. 03/25/14  Yes [name], MD   acetaminophen (TYLENOL) 325 MG tablet Take 2 tablets (650 mg total) by mouth every 4 (four) hours as needed for Pain. 03/14/14   Jacelyn Pi, PA   amiodarone (PACERONE) 200 MG tablet Take 1 tablet (200 mg total) by mouth daily. 03/25/14   [name], MD   amLODIPine (NORVASC) 10 MG tablet Take 1 tablet (10 mg total) by mouth daily. 03/25/14   [name], MD   aspirin 325 MG tablet Take 1 tablet (325 mg total) by mouth daily.  Patient taking differently: Take 81 mg by mouth daily.    03/14/14   Jacelyn Pi, PA   atorvastatin (LIPITOR) 10 MG tablet Take 1 tablet (10 mg total) by mouth nightly. 03/25/14   [name], MD    calcitRIOL (ROCALTROL) 0.25 MCG capsule Take 1 capsule (0.25 mcg total) by mouth daily. 03/25/14   [name], MD   carvedilol (COREG) 25 MG tablet Take 1 tablet (25 mg total) by mouth every 12 (twelve) hours. 03/14/14   Jacelyn Pi, PA   collagenase (SANTYL) ointment Apply topically daily. The affected wound area. 04/20/14   Johney Maine, DPM   furosemide (LASIX) 20 MG tablet Take 1 tablet (20 mg total) by mouth daily. 03/25/14   [name], MD   gabapentin (NEURONTIN) 300 MG capsule Take 1 capsule (300 mg total) by mouth 2 (two) times daily. 03/25/14   [name], MD   insulin aspart (NOVOLOG) 100 UNIT/ML injection Inject 1-4 Units into the skin 3 (three) times daily before meals as needed for High Blood Sugar. #90 insulin syringes and insulin needles 03/25/14   [name], MD   oxyCODONE-acetaminophen (PERCOCET) 5-325 MG per tablet Take 1 tablet by mouth every 4 (four) hours as needed. 03/14/14   Jacelyn Pi, PA   venlafaxine (EFFEXOR) 75 MG tablet Take 1 tablet (75 mg total) by mouth 2 (two) times daily. 03/25/14   [name], MD       Review of Systems:   A comprehensive review of systems was: Negative except as noted in the HPI    Physical Exam:     Filed Vitals:    04/27/14 1300   BP: 160/72   Pulse: 51   Temp:    Resp: 20   SpO2: 98%       Vascular:  palpable pedal pulses with normal CFT to the digits. Edema to the RLE up to the proximal leg.     Derm:  Wound to the anterior medial ankle and the medial dorsal midfoot of the RLE. Bone to the base of the wound. Erythema to the surrounding skin with atrophic changes. No other open wounds to the RLE and the LLE.      Ortho: rectus foot structure. No remarkable  foot deformity.     Neuro: diminished light touch sensation to the level of the midfoot.         Labs Reviewed:     Lab Results   Component Value Date    WBC 6.27 04/25/2014    HGB 9.6* 04/25/2014    HCT 30.8* 04/25/2014    MCV 87.0 04/25/2014    PLT 208 04/25/2014     Lab  Results   Component Value Date    CREAT 4.0* 04/27/2014    BUN 96* 04/27/2014    NA 135* 04/27/2014    K 5.6* 04/27/2014    CL 107 04/27/2014    CO2 16* 04/27/2014     No results found for: CRP  No results found for: ESR    Microbiology:   Microbiology Results     Procedure Component Value Units Date/Time    MRSA culture [[id number]] Collected:  04/22/14 1832    Specimen Information:  Body Fluid / Nasal/Throat ASC Admission Updated:  04/24/14 0215    Narrative:      ORDER#: [id number]                                    ORDERED BY: Shirleen Schirmer  SOURCE: Nares and Throat                             COLLECTED:  04/22/14 18:32  ANTIBIOTICS AT COLL.:                                RECEIVED :  04/23/14 00:41  Culture MRSA Surveillance                  FINAL       04/24/14 02:15  04/24/14   Negative for Methicillin Resistant Staph aureus from Nares and             Negative for Methicillin Resistant Staph aureus from Throat      Wound culture & gram stain [[id number]] Collected:  04/22/14 2134    Specimen Information:  Wound / Ulcer Updated:  04/26/14 1003    Narrative:      ORDER#: [id number]                                    ORDERED BY: Shirleen Schirmer  SOURCE: Ulcer right ankle                            COLLECTED:  04/22/14 21:34  ANTIBIOTICS AT COLL.:                                RECEIVED :  04/23/14 01:53  Stain, Gram                                FINAL       04/23/14 02:39  04/23/14   No WBCs seen             No Squamous epithelial cells seen             Many Gram positive  cocci             Many Gram negative rods             Few Gram positive rods  Culture and Gram Stain, Aerobic, Wound     FINAL       04/26/14 10:03   +  04/24/14   Heavy growth of mixed gram positive flora, unable to isolate for             identification due to swarming of Proteus  04/26/14   Heavy growth of MDR Proteus mirabilis               Ceftriaxone Resistant Enterobacteriaceae.             This multidrug resistant (MDR) organism may not  respond             optimally to B-lactam antibiotics (excluding carbapenems).             [facility] Ant[employer]icrobial [employer]committee June 2015    _____________________________________________________________________________                                MDR P.mirabilis   ANTIBIOTICS                     MIC  INTRP      _____________________________________________________________________________  Amikacin                        <=8    S        Amoxicillin/CA                  8/4    S        Ampicillin                      >16    R        Aztreonam                       <=2    R        Cefazolin                       >16    R        Cefepime                        >16    R        Ceftazidime                     <=2    R        Ceftriaxone                     >32    R        Ciprofloxacin                   >2     R        Ertapenem                     <=0.25   S        Gentamicin                      >  8     R        Levofloxacin                    >4     R        Piperacillin/Tazobactam        <=2/4   S        Tetracycline                    >8     R        Tobramycin                      >8     R        Trimethoprim/Sulfamethoxazole  >2/38   R        _____________________________________________________________________________            S=SUSCEPTIBLE     I=INTERMEDIATE     R=RESISTANT                            N/S=NON-SUSCEPTIBLE  _____________________________________________________________________________              Rads:   Radiological Procedure reviewed.        Assessment:   -Patient is 64 y.o. 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 Pt will benefit from BKA at the current time given the chronicity of the condition, although she prefers palliative type of care with a little as possible intervention.     Plan:   - Wet/dry with NSS dressing to the wound daily.   - WBAT to the BLE.  - Monitor the wound for signs of infection and in case of worsening, intervention may be needed.   - No need of abx at  the current time.   - Plan discussed with attending, Dr Gerre Pebbles. Thank you for the consult.    Isla Pence, DPM  [facility] PGY-2  [phone]

## 2014-04-27 NOTE — Treatment Plan (Signed)
Pt up to room commode, unable to pass stool.  PRN suppository given for relief of constipation.  Vomiting trace amount of clear emesis. Will continue to monitor.

## 2014-04-27 NOTE — OT Progress Note (Signed)
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    Assessment   Pt tolerated session fairly well.  Pt with c/o nausea and headache pain throughout session.  O2 sat remained above 88% most of session with lowest at 79% (with less than 10 seconds recovery time).  Pt progressing slowly toward goals however demonstrated ability to improve from initial evaluation.          Plan     OT Plan  Risks/Benefits/POC Discussed with Pt/Family: With patient  Treatment Interventions: ADL retraining;Functional transfer training;UE strengthening/ROM;Endurance training;Patient/Family training;Equipment eval/education  Discharge Recommendation: Acute Rehab  OT Frequency Recommended: 2-3x/wk  Continue plan of care.    Interdisciplinary Communication: RN aware of session outcome    Subjective   "I have a headache."  Patient's medical condition is appropriate for Occupational Therapy  intervention at this time.  Patient is agreeable to participation in the therapy session. Nursing clears patient for therapy.  Pain: 7/10  Location: headache which increases with movement  Therapist Intervention:  repositioned and returned to supine postition  Patient is satisfied with therapist intervention.      Objective   Precautions and Contraindications:   Fall, contact isolation    Patient is in bed with  Telemetry, Intravenous (IV), Pulse Oximeter  and O2 at 3.5 liters/minute via NC  in place.     Cognition:  Flat and angry affect; alert, oriented    Self Care:  Feeding: supervision seated EOB    Functional Mobility:  Supine to Sit: mod A with HOB elevated  Sit to Supine: min A with rails and HOB flat  Sit to Stand: min A  Transfers: min A side steps along side of bed      Treatment Activities: self care, mobility retraining    Observation of Patient:  Vitals:   Filed Vitals:    04/27/14 1100 04/27/14 1122 04/27/14 1200 04/27/14 1300   BP: 149/65  147/67 160/72   Pulse: 66 58 55 51   Temp:   97.5 F (36.4 C)    TempSrc:   Oral    Resp: 19  20 20    Height:       Weight:       SpO2: 93% 96% 92% 98%       Education:   Educated the patient to role of occupational therapy, plan of care, goals of therapy and safety with mobility and ADLs, pursed lip breathing, home safety.    Patient left in bed with all needs met, equipment  intact and call bell within reach. RN notified of session outcome.  Mobility and ADL status posted at bedside and within E.M.R.          Goals:     ADL Goals  Patient will dress upper body: Independent  Patient will dress lower body: Supervision  Pt will complete bathing: Supervision  Patient will toilet: Supervision  Mobility and Transfer Goals  Pt will perform functional transfers: Supervision        Executive Fucntion Goals  Pt will follow energy conservation techniques: independent        Signature:   Darci Needle, OT  04/27/2014 2:10 PM   Phone: [phone]

## 2014-04-27 NOTE — Progress Notes (Addendum)
1610 Dr Wilhelmina Mcardle rounding on patient.  Pt status reviewed to include upcoming transfer to [facility] for pacer placement.  Dr Wilhelmina Mcardle wishes to speak with cardiology and asks Rn to page Dr Betti Cruz prior to this happening.  Dr Betti Cruz answering service contracted, page send to Dr Franchot Erichsen.     [name]  Dr Franchot Erichsen called and given update on Pt's status to include Dr Wilhelmina Mcardle' concerns.  Dr Wilhelmina Mcardle unavailable and not in CCU unit.  Dr Franchot Erichsen is to call Dr Wilhelmina Mcardle to discuss pacer placement and lung status.  Will continue to monitor.

## 2014-04-27 NOTE — Treatment Plan (Signed)
Blood glucose level checked by unit tech.  Orders for TID glucose sticks noted and unit tech informed of this order. Pt to be NPO after midnight.  Pt did not eat any dinner and is nauseous at this time., Insulin held to prevent low glucose levels later.

## 2014-04-27 NOTE — PT Progress Note (Signed)
Physical Therapy Note    [facility]  [address]  [place] [address]  [phone]    Physical Therapy Treatment    Patient:  [name]        MRN#:  [id number]  Unit:  CCU CRITICAL CARE        Room/Bed:  M602/M602-01    Medical Diagnosis: Diabetes mellitus type II, uncontrolled [E11.65]  Renal insufficiency [N28.9]  Junctional bradycardia [R00.1]  Aspiration pneumonia of right lower lobe, unspecified aspiration pneumonia type [J69.0]  Complete heart block [I44.2]    Time of treatment:  PT Received On: 04/27/14  Start Time: 1630 Stop Time: 1700  Time Calculation (min): 30 min    Treatment #: PT Visit Number: 4/7    Patient's medical condition is appropriate for Physical Therapy intervention at this time.    Assessment   Pt with increased tremor, slurred speech, confusion, decreased coordination today possibly related to potassium level or c/o nausea, but able to progress overall mobility to include multiple transfers and farther ambulation.     Plan   Recommendation  Discharge Recommendation: Home with supervision, Home with home health PT, Acute Rehab  PT Frequency: 3-4x/wk    If AR recommended d/c disposition is not available, patient will need Home with supv and HHPT.    Continue plan of care if pt does not get d/c'd to [facility] for pacemaker as planned.    Interdisciplinary Communication: Nursing made aware of session outcome.    Subjective   Pt c/o nausea due to having to drink medication to help control potassium level and reduce problem with constipation. Pt making occasionally confused statements and requiring reorientation.  Patient is agreeable to participation in the therapy session. Nursing clears patient for therapy.  Pain: None reported/indicated    Vitals: SpO2 95-88% on 4L/min, with quick return to >90% after a few seconds PLB   Filed Vitals:    04/27/14 1200 04/27/14 1300 04/27/14 1500 04/27/14 1600   BP: 147/67 160/72 149/57 155/67   Pulse: 55 51 51 52   Temp: 97.5 F (36.4 C)    97.3 F (36.3 C)   TempSrc: Oral   Oral   Resp: 20 20 30 20    Height:       Weight:       SpO2: 92% 98% 92% 95%       Objective     Precautions/ Contraindications:  Fall, contact isolation, sternal precautions (until 05/15/14)    Patient is in bed with  Telemetry, Intravenous (IV), Pulse Oximeter , O2 at 4 liters/minute via NC , Blood Pressure Cuff and Dressing in place.    Therapeutic exercises:  Ankle Pumps: 10  Quad Sets: 10  Glut Sets: 10  Heel Slides: 5  Hip abduction/adduction: 10  Long Arc Quad: 10    Functional Mobility:  Rolling: min A  Supine to Sit: min A  Scooting: min A-CG  Sit to Supine: Min A  Sit to Stand: Min A  Stand to Sit: Min A  Transfer Training: on/off EOB, toilet    Gait:   WB status: WBAT RLE  Assistive Device: UE support on IV pole platform  Assist Level: Min A  Distance: 4 steps to bed to toilet and back to bed  Pattern: narrow BOS, forward flexed trunk, ataxia, mild tremor     Educated the patient to role of physical therapy, plan of care, goals  of therapy and HEP, safety with mobility and ADLs, energy conservation  techniques, pursed lip breathing, sternal precautions, rationale for progressing mobility.    Patient left in bed with all needs met, equipment intact and call bell within reach. RN notified of session outcome.   Mobility status posted at bedside and within E.M.R.    Goals per Eval/ Re-eval:   Goals  Goal Formulation: With patient  Time for Goal Acheivement: By time of discharge  Goals: Select goal  Pt Will Go Supine To Sit: independent, to maximize functional mobility and independence  Pt Will Perform Sit to Stand: modified independent, to maximize functional mobility and independence  Pt Will Ambulate: 101-150 feet, with four wheel walker, with supervision, to maximize functional mobility and independence  Pt Will Go Up / Down Stairs: 1-2 stairs, with supervision, With rail    Signature:  Chester Holstein, PT  04/27/2014 5:10 PM   Phone: [phone]

## 2014-04-27 NOTE — Plan of Care (Signed)
Problem: Health Promotion  Goal: Knowledge - disease process  Extent of understanding conveyed about a specific disease process.   Outcome: Progressing  Oral education given about need for cardiac pacemaker.  Reenforcement needed.    Problem: Safety  Goal: Patient will be free from injury during hospitalization  Call light in reach, bed in lowest position, bed ground in use, side rails x2.  Encouraged to call staff for assistance with needs. Stated she would.    Problem: Tissue integrity  Goal: Damaged tissue is healing and protected  Rt foot dressing c/d/i  Goal: Nutritional Status Improving  Encouraged to eat what she wants within her carb consist diet plan.    Problem: Urinary Incontinence  Goal: Perineal skin integrity is maintained or improved  Assess genitourinary system, perineal skin, labs (urinalysis), and history of incontinence to include past management, aggravating, and alleviating factors. Collaborate with interdisciplinary team and initiate plans and interventions as needed.   Skin intact in peri area. Turn q 2 hours, self reg

## 2014-04-27 NOTE — Progress Notes (Signed)
Severe Sepsis Screen    Date: 04/27/2014 Time: 8:38 AM  Nurse Signature: Jasminne Mealy L Da-Silveira    Exclusions:      Patients meeting the following criteria are excluded from screening:     []  Suspicion or diagnosis of sepsis is documented and until 72 hours after antibiotics started or last regimen change:   - If Yes, Date of Documented Sepsis:                                          - If Yes, Date of last change in antibiotics:                                           []  Surgery - No screening for 24 hours after surgery   - If Yes, Date of Surgery:                                           []  Arctic Sun hypothermia protocol- Resume screening when arctic sun complete   []  Comfort Care Orders- Do not resume screening    Did you check any of the boxes above?     [x]  No, Continue to section A   []  Yes, Stop Here, Patient Excluded from Sepsis Screening. If screening should resume in the future, place "sticky note to treatment team" with date/time of when screening should resume. Communicate patient excluded from screening due to Comfort Care Orders using "sticky note to treatment team."    A. Infection:      Does your patient have ONE or more of the following infection criteria?     [x]  Documented Infection - Does the patient have positive culture results (from blood, sputum, urine, etc)?   [x]  Anti-Infective Therapy - Is the patient receiving antibiotic, antifungal, or other anti-infective therapy?   []  Pneumonia - Is there documentation of pneumonia (X Ray, etc)?   []  WBC's - Have WBC's been found in normally sterile fluid (urine, CSF, etc.)?   []  Perforated Viscus - Does the patient have a perforated hollow organ (bowel)?    A.  Did you check any of the boxes above?     []  No, Stop Here and Sepsis Screen Negative   [x]  Yes, continue to section B    B. SIRS:      Does your patient have TWO or more of the following SIRS criteria?     []  Temperature - Is the patient's temperature: Temp: 98.4 F (36.9 C)  (04/27/14 0800)   - Greater than or equal to 38.3 degrees C (greater than 100.9 degrees F)?   - Less than or equal to 36 degrees C (less than or equal to 96.8 degrees F)?    []  Heart Rate: Heart Rate: (!) 56 (04/27/14 0800)   - Is the patient's heart rate greater than or equal to 90 bpm?    [x]  Respiratory: Resp Rate: (!) 31 (04/27/14 0800)   - Is the patient's respiratory rate greater than 20?     []  WBC Count - Is the patient's WBC count:   Recent Labs  Lab 04/25/14  1816   WBC 6.27       -  Greater than or equal to 12,000/mm3 OR   - Less than or equal to 4,000/mm3 OR    - Are there greater than 10% immature neutrophils (bands)?     []  Glucose >140 without diabetes or on steroids?   Recent Labs  Lab 04/26/14  0430   GLUCOSE 182*         []  Significant edema is present?    B.  Did you check two or more of the boxes above?     [x]  No, Stop Here and Sepsis Screen Negative   []  Yes, contact Charge Nurse, continue to section C    C. ACUTE Organ Dysfunction:      Does your patient have ONE or more of the following organ dysfunction? (May need to wait for lab results for assessment - see below) Organ dysfunction must be a result of the sepsis NOT CHRONIC conditions.     []  Cardiovascular - Does the patient have a: BP: 148/65 mmHg (04/27/14 0800)   - Systolic Blood Pressure less than or equal to 90 mmHg OR   - Systolic Blood Pressure has dropped 40 mmHg or more from baseline OR   - Mean Arterial Pressure less than or equal to 70 mmHg (for at least one hour despite fluid resuscitation OR   - require vasopressor support?     []  Respiratory - Does the patient have new hypoxia defined by any of the following?   - A sustained increase in oxygen requirements by at least 2L/min on NC or 28% FiO2 within the last 24 hrs OR   - A persistent decrease in oxygen saturation of greater than or equal to 5% lasting at least four or more hours and occurring within the last 24 hours     []  Renal - Does the patient have:   - low urine  output (e.g. Less than 0.5 mL/kg/HR for one hour despite adequate fluid resuscitation OR   - Increased creatinine (greater than 50% increase from baseline) OR   - require acute dialysis?     []  Hematologic - Does the patient have:   - Low platelet count (less than 100,000 mm3)   Recent Labs  Lab 04/25/14  1816   PLATELETS 208    OR   - INR/aPTT greater than upper limit of normal?   Recent Labs  Lab 04/22/14  1409   PT INR 1.3*    OR        []  Metabolic - Does the patient have a high lactate (plasma lactate greater than or equal to 2.4 mMol/L?        []  Hepatic - Are the patient's liver enzymes elevated (ALT greater than 72 IU/L or Total Bilirubin greater than 2 MG/dL)?   Recent Labs  Lab 04/22/14  1409   BILIRUBIN, TOTAL 0.3   ALT 88*         []  CNS - Does the patient have altered consciousness or reduced Glasgow Coma Scale?     Other Active Diagnoses that may be contributing to signs of end organ dysfunction (Ex. Chronic kidney disease, cirrhosis):   - ________________________________________________________________      C.  Did you check any of the boxes above?     []  No, Sepsis Screen Negative   []  YES:  A) Infection + B) SIRS + C) Acute Organ Dysfunction = Positive Screen for Severe Sepsis. Notify attending (house officer during off-hours).     Notify Attending/House Technical sales engineer and document in Complex Assessment under provider  notification   - Name of physician notified:                                           - Date/Time Notifiied:                                             Document actions:   []  Lactate drawn   []  Blood Cultures obtained   []  Antibiotics initiated or modified   []   IV Fluid administered 0.9% NS __________ mLs given   Nursing Comments/Narrative:    - ______________________________________________________________     The Surviving Sepsis Guidelines recommend the following interventions to be completed within one hour of a positive sepsis screen.   Obtain new blood cultures prior  to antibiotic administration if not done within the last 24 hours.   Obtain lactate level, if initial lactate > 58mmol, repeat lactate in 2 hours for goal decrease 10-20%; If there is not a decrease call Doctor, hospital   If SBP < 90 or MAP < 65 or lactate greater than 4 mmol/dl; Start 0.9% NS IV Fluid Bolus of 30 mL/Kg (minimum) to maintain MAP > 65    Initiate vasopressors for hypotension not responding to fluid resuscitation (1st line Norepinephrine 1 - 300 mcg/min IV) - Patient must be in CCU if requires vasopressor   Initiate or escalate antibiotic therapy   Goal urine output greater than/equal to 0.5 mL/kg/hr

## 2014-04-27 NOTE — Progress Notes (Signed)
Multidisciplinary round done this A.M. With:  Dr: Cuiper  Charge Nurse: Roger  Pharmacist:  Dietician:  Case Manager:

## 2014-04-27 NOTE — Plan of Care (Signed)
Problem: Psychosocial and Spiritual Needs  Goal: Demonstrates ability to cope with hospitalization/illness  Outcome: Progressing  Alert and oriented, patient is withdrawn and has flat affect. She does not want to engage in conversation.  She denies pain all shift even with dressing change.   Quiet environment provided.     Problem: Moderate/High Fall Risk Score >/=15  Goal: Patient will remain free of falls  Outcome: Progressing  Fall prevention safety plan reviewed with patient, she verbalized understanding. Bed alarm on, call bell within reach, hourly rounding in progress.         Problem: Potential for Compromised Skin Integrity  Goal: Skin integrity is maintained or improved  Assess and monitor skin integrity. Identify patients at risk for skin breakdown on admission and per policy. Collaborate with interdisciplinary team and initiate plans and interventions as needed.   Outcome: Progressing  Skin integrity maintained, patient is continent and needs one person assistance to the commode.   She is more comfortable in a sitting position  She constantly change position.  Goal: Nutritional status is improving  Monitor and assess patient for malnutrition (ex- brittle hair, bruises, dry skin, pale skin and conjunctiva, muscle wasting, smooth red tongue, and disorientation). Collaborate with interdisciplinary team and initiate plan and interventions as ordered. Monitor patient's weight and dietary intake as ordered or per policy. Utilize nutrition screening tool and intervene per policy. Determine patient's food preferences and provide high-protein, high-caloric foods as appropriate.   Outcome: Progressing  Patient has poor appetite, ate about 10-25% of all three meals.   Encouraged fluid intake.    Problem: Hemodynamic Status: Cardiac  Goal: Stable vital signs and fluid balance  Outcome: Progressing  Labored breathing, patient remained on 3L oxygen most of the shift.   SBP has been around 150, with junctional  ventricular rhythm in the 50's  Potassium level = 5.7, 30 g of Keyaxelate given, no BM at this time, will continue to monitor.

## 2014-04-27 NOTE — Progress Notes (Signed)
Bari Edward, MD  [facility]  Tel:  [phone]      Assessment :   Patient is a 64 year old female with history of coronary artery disease status post coronary artery bypass grafting December 2015, nonhealing right lower extremity ulcer, hypertension, hyperlipidemia, chronic kidney disease, mild to mod AS, ICM LVEF 45% presents to the ED with complaints of fatigue, dizziness, found to be significantly bradycardic in junctional rhythm with Acute on CRI   Bradycardia:  Continues to have junctional rhythm with retrograde P waves . Off BB for 5 days   Acute on chronic diastolic heart failure in the setting of IV hydration .  IV fluids stopped yesterday and diuretics restarted.  Some improvement in orthopnea but continues to be short of breath   CAD S/p prior PCI , recent CABGx2 02/2014: Currently stable   Right lower lobe infiltrate   Acute on chronic renal insufficiency, likely in the setting of significant bradycardia; Now with ATN.     Poor healing ankle ulcer ( recent Peripheral angio no sig obstructive dz and hence not intervened upon   Ischemic cardiomyopathy, LVEF of 40 percent: , currently improved to 50-55 percent Was on  beta blocker and hydralazine as an outpatient.  No ACEI, ARB due to  CRI. Now off BB   HTN: better control on hydralazine and Procardia    Aortic stenosis .  Moderate by prior echo but mild by most recent echo    Right lower lobe infiltrate .  Doubt pneumonia .,  Possible atelectasis with the right lower lobe effusion   Plan:   .  Patient continues to be symptomatic with shortness of breath and orthopnea. inspite of diuresis, although symptoms have improved, her respiratory status is not optimal.Would favor treatment of her congestive heart failure better and reschedule pacemaker placement for tomorrow     Stat BMP ordered   Discussed with Dr  Wilhelmina Mcardle,  nursing staff along with the patient       Cardiology Diagnostics     Telemetry:  (I have personally reviewed  the telemetry strips)  Junctional rhythm with retrograde P waves.      Echocardiogram: ( personally reviewed the report)02/2014  LVEF 45%, Mod AS,     ECHO 04/2014;  1.Technically limited study  2. Normal left ventricular size and systolic function, estimated EF  50-55%  3. Mild to moderate left ventricular hypertrophy  4. Mild aortic stenosis  5. Moderate tricuspid regurgitation  6. Moderate pulmonary hypertension  7. Moderately dilated left atrium    Problem list     Patient Active Problem List   Diagnosis   . NSTEMI (non-ST elevated myocardial infarction)   . Hypertensive urgency   . PAD (peripheral artery disease)   . Arterial leg ulcer   . Chronic obstructive pulmonary disease, unspecified COPD type   . Ischemic cardiomyopathy   . CAD (coronary artery disease)   . Diabetes   . Chronic kidney disease   . Debilitated   . Complete heart block   . Junctional bradycardia   . Hyperkalemia          Events/ROS/ Subjective since last 24 hrs:         C/o worsening SOB    Objective:   Vitals reviewed     VS: BP 148/65 mmHg  Pulse 56  Temp(Src) 98.4 F (36.9 C) (Oral)  Resp 31  Ht 1.549 m (5\' 1" )  Wt 79.9 kg (176 lb 2.4 oz)  BMI 33.30 kg/m2  SpO2 92%    Intake/Output Summary (Last 24 hours) at 04/27/14 0844  Last data filed at 04/26/14 2000   Gross per 24 hour   Intake     60 ml   Output    300 ml   Net   -240 ml       Constitutional :some respiratory distress with pursed lip breathing   Respiratory:  Poor air movement and respiratory effort  bilaterally.  Some use of  accessory muscles.  Decreased BS at the bases  Cardiovascular: Regular  S1 and S2.. 2/6 SM at the LSB No carotid bruits.  +JVD  Dorsal pedis  Left feeble. Right bandaged. Trace edema b/l   Gastrointestinal Soft. Non-tender. Normoactive BS. No abdominal bruits  Psychiatric: AAO X3.  Normal mood and effect.    Laboratory Studies:   (I have personally reviewed the laboratory values below)      CBC w/Diff     Recent Labs  Lab 04/25/14  1816 04/22/14  1409    WBC 6.27 4.94   HEMOGLOBIN 9.6* 9.7*   HEMATOCRIT 30.8* 30.5*   PLATELETS 208 222          Basic Metabolic Profile     Recent Labs  Lab 04/26/14  0430 04/25/14  0414 04/24/14  0332   SODIUM 136 135* 133*   POTASSIUM 5.3* 4.9 4.9   CHLORIDE 109 109 107   CO2 16* 18* 18*   BUN 83* 89* 96*   CREATININE 3.0* 3.4* 3.7*   EGFR 18.9 16.4 14.9   GLUCOSE 182* 123* 133*   CALCIUM 8.7 8.6 8.2*            Cardiac Enzymes     Recent Labs  Lab 04/22/14  1407   TROPONIN I 0.03          Thyroid Studies     Recent Labs  Lab 04/24/14  0332   THYROID-STIMULATING HORMONE (TSH) BASELINE 5.74*          Cholesterol Panel            Coagulation Studies     Recent Labs  Lab 04/22/14  1409   PT 16.2*   PT INR 1.3*              Current Medications   Meds reviewed:    Current Facility-Administered Medications   Medication Dose Route Frequency   . aspirin EC  325 mg Oral Daily   . atorvastatin  10 mg Oral QHS   . calcitRIOL  0.25 mcg Oral Daily   . docusate sodium  100 mg Oral Daily   . furosemide  40 mg Intravenous BID   . gabapentin  300 mg Oral BID   . heparin (porcine)  5,000 Units Subcutaneous Q12H [facility]   . hydrALAZINE  100 mg Oral Q8H [facility]   . iron sucrose  300 mg Intravenous Q24H [facility]   . levofloxacin  250 mg Intravenous Q24H   . NIFEdipine ER  60 mg Oral Daily   . sodium bicarbonate  650 mg Oral BID   . venlafaxine  75 mg Oral BID                Bari Edward, MD  04/27/2014 8:44 AM

## 2014-04-27 NOTE — Treatment Plan (Addendum)
OOB to commode per pt's request to have BM.  Pt gait steady, one person assist.     2330  Pt unable to pass bm but is passing flatus at this time.  Abd soft, mild roundness noted.

## 2014-04-27 NOTE — Progress Notes (Signed)
Multidisciplinary round done this A.M. With:  Dr: Mikey Bussing  Charge Nurse: Cordelia Pen  Pharmacist: Jae Dire  Dietician: Marylene Land  Case Manager: Ramon Dredge

## 2014-04-27 NOTE — Treatment Plan (Signed)
1945  Assessment per flow sheet. Pt denies nausea at this time. Dinner tray untouched, requesting water to drink. Belly distended.  Pt states she is not passing gas, mild abd pain.  Prn med suppository noted and will be given this pm, pt to let staff know when.,

## 2014-04-27 NOTE — Treatment Plan (Signed)
Pt npo at this time.

## 2014-04-28 ENCOUNTER — Inpatient Hospital Stay: Payer: Medicare Other | Admitting: Internal Medicine

## 2014-04-28 ENCOUNTER — Inpatient Hospital Stay: Payer: Medicare Other

## 2014-04-28 ENCOUNTER — Inpatient Hospital Stay
Admission: RE | Admit: 2014-04-28 | Discharge: 2014-05-16 | DRG: 682 | Disposition: A | Discharge status: Skilled Nursing Facility | Payer: Medicare Other | Source: Other Acute Inpatient Hospital | Attending: Internal Medicine | Admitting: Internal Medicine

## 2014-04-28 ENCOUNTER — Encounter
Admission: RE | Disposition: A | Discharge status: Skilled Nursing Facility | Payer: Self-pay | Source: Other Acute Inpatient Hospital | Attending: Internal Medicine

## 2014-04-28 DIAGNOSIS — R001 Bradycardia, unspecified: Secondary | ICD-10-CM | POA: Diagnosis present

## 2014-04-28 DIAGNOSIS — G9341 Metabolic encephalopathy: Secondary | ICD-10-CM | POA: Diagnosis present

## 2014-04-28 DIAGNOSIS — G8191 Hemiplegia, unspecified affecting right dominant side: Secondary | ICD-10-CM | POA: Diagnosis not present

## 2014-04-28 DIAGNOSIS — I350 Nonrheumatic aortic (valve) stenosis: Secondary | ICD-10-CM | POA: Diagnosis present

## 2014-04-28 DIAGNOSIS — R7989 Other specified abnormal findings of blood chemistry: Secondary | ICD-10-CM

## 2014-04-28 DIAGNOSIS — D509 Iron deficiency anemia, unspecified: Secondary | ICD-10-CM | POA: Diagnosis present

## 2014-04-28 DIAGNOSIS — E875 Hyperkalemia: Secondary | ICD-10-CM | POA: Diagnosis present

## 2014-04-28 DIAGNOSIS — L97519 Non-pressure chronic ulcer of other part of right foot with unspecified severity: Secondary | ICD-10-CM | POA: Diagnosis present

## 2014-04-28 DIAGNOSIS — Z87891 Personal history of nicotine dependence: Secondary | ICD-10-CM

## 2014-04-28 DIAGNOSIS — I513 Intracardiac thrombosis, not elsewhere classified: Secondary | ICD-10-CM | POA: Diagnosis not present

## 2014-04-28 DIAGNOSIS — E785 Hyperlipidemia, unspecified: Secondary | ICD-10-CM | POA: Diagnosis present

## 2014-04-28 DIAGNOSIS — J441 Chronic obstructive pulmonary disease with (acute) exacerbation: Secondary | ICD-10-CM | POA: Diagnosis not present

## 2014-04-28 DIAGNOSIS — I509 Heart failure, unspecified: Secondary | ICD-10-CM | POA: Diagnosis present

## 2014-04-28 DIAGNOSIS — N170 Acute kidney failure with tubular necrosis: Principal | ICD-10-CM | POA: Diagnosis present

## 2014-04-28 DIAGNOSIS — N19 Unspecified kidney failure: Secondary | ICD-10-CM

## 2014-04-28 DIAGNOSIS — B964 Proteus (mirabilis) (morganii) as the cause of diseases classified elsewhere: Secondary | ICD-10-CM | POA: Diagnosis present

## 2014-04-28 DIAGNOSIS — I2510 Atherosclerotic heart disease of native coronary artery without angina pectoris: Secondary | ICD-10-CM | POA: Insufficient documentation

## 2014-04-28 DIAGNOSIS — I252 Old myocardial infarction: Secondary | ICD-10-CM

## 2014-04-28 DIAGNOSIS — I2699 Other pulmonary embolism without acute cor pulmonale: Secondary | ICD-10-CM

## 2014-04-28 DIAGNOSIS — I129 Hypertensive chronic kidney disease with stage 1 through stage 4 chronic kidney disease, or unspecified chronic kidney disease: Secondary | ICD-10-CM | POA: Diagnosis present

## 2014-04-28 DIAGNOSIS — I25119 Atherosclerotic heart disease of native coronary artery with unspecified angina pectoris: Secondary | ICD-10-CM | POA: Diagnosis present

## 2014-04-28 DIAGNOSIS — Z992 Dependence on renal dialysis: Secondary | ICD-10-CM

## 2014-04-28 DIAGNOSIS — E876 Hypokalemia: Secondary | ICD-10-CM | POA: Diagnosis not present

## 2014-04-28 DIAGNOSIS — I63412 Cerebral infarction due to embolism of left middle cerebral artery: Secondary | ICD-10-CM | POA: Diagnosis not present

## 2014-04-28 DIAGNOSIS — G9340 Encephalopathy, unspecified: Secondary | ICD-10-CM

## 2014-04-28 DIAGNOSIS — N189 Chronic kidney disease, unspecified: Secondary | ICD-10-CM

## 2014-04-28 DIAGNOSIS — R4701 Aphasia: Secondary | ICD-10-CM | POA: Diagnosis not present

## 2014-04-28 DIAGNOSIS — E872 Acidosis: Secondary | ICD-10-CM | POA: Diagnosis present

## 2014-04-28 DIAGNOSIS — N2581 Secondary hyperparathyroidism of renal origin: Secondary | ICD-10-CM | POA: Diagnosis present

## 2014-04-28 DIAGNOSIS — I6310 Cerebral infarction due to embolism of unspecified precerebral artery: Secondary | ICD-10-CM

## 2014-04-28 DIAGNOSIS — R69 Illness, unspecified: Secondary | ICD-10-CM

## 2014-04-28 DIAGNOSIS — D631 Anemia in chronic kidney disease: Secondary | ICD-10-CM | POA: Diagnosis present

## 2014-04-28 DIAGNOSIS — N184 Chronic kidney disease, stage 4 (severe): Secondary | ICD-10-CM | POA: Diagnosis present

## 2014-04-28 DIAGNOSIS — Z951 Presence of aortocoronary bypass graft: Secondary | ICD-10-CM

## 2014-04-28 DIAGNOSIS — I272 Other secondary pulmonary hypertension: Secondary | ICD-10-CM | POA: Diagnosis present

## 2014-04-28 DIAGNOSIS — R29810 Facial weakness: Secondary | ICD-10-CM | POA: Diagnosis not present

## 2014-04-28 DIAGNOSIS — J449 Chronic obstructive pulmonary disease, unspecified: Secondary | ICD-10-CM

## 2014-04-28 DIAGNOSIS — N390 Urinary tract infection, site not specified: Secondary | ICD-10-CM | POA: Diagnosis present

## 2014-04-28 DIAGNOSIS — N179 Acute kidney failure, unspecified: Secondary | ICD-10-CM

## 2014-04-28 DIAGNOSIS — E1165 Type 2 diabetes mellitus with hyperglycemia: Secondary | ICD-10-CM | POA: Diagnosis present

## 2014-04-28 DIAGNOSIS — I255 Ischemic cardiomyopathy: Secondary | ICD-10-CM | POA: Diagnosis present

## 2014-04-28 DIAGNOSIS — E1151 Type 2 diabetes mellitus with diabetic peripheral angiopathy without gangrene: Secondary | ICD-10-CM | POA: Diagnosis present

## 2014-04-28 DIAGNOSIS — Z880 Allergy status to penicillin: Secondary | ICD-10-CM

## 2014-04-28 DIAGNOSIS — F419 Anxiety disorder, unspecified: Secondary | ICD-10-CM | POA: Diagnosis not present

## 2014-04-28 DIAGNOSIS — Z1624 Resistance to multiple antibiotics: Secondary | ICD-10-CM | POA: Diagnosis present

## 2014-04-28 LAB — BASIC METABOLIC PANEL
Anion Gap: 12 (ref 5.0–15.0)
BUN: 103 mg/dL — ABNORMAL HIGH (ref 7–19)
CO2: 16 mEq/L — ABNORMAL LOW (ref 22–29)
Calcium: 8.8 mg/dL (ref 8.5–10.5)
Chloride: 107 mEq/L (ref 100–111)
Creatinine: 4.8 mg/dL — ABNORMAL HIGH (ref 0.6–1.0)
Glucose: 213 mg/dL — ABNORMAL HIGH (ref 70–100)
Potassium: 5.3 mEq/L — ABNORMAL HIGH (ref 3.5–5.1)
Sodium: 135 mEq/L — ABNORMAL LOW (ref 136–145)

## 2014-04-28 LAB — LACTIC ACID, PLASMA: Lactic Acid: 1.3 mmol/L (ref 0.2–2.0)

## 2014-04-28 LAB — HEPATITIS B SURFACE ANTIGEN W/ REFLEX TO CONFIRMATION: Hepatitis B Surface Antigen: NONREACTIVE

## 2014-04-28 LAB — ECG 12-LEAD
Atrial Rate: 49 {beats}/min
Atrial Rate: 56 {beats}/min
Q-T Interval: 438 ms
Q-T Interval: 448 ms
QRS Duration: 90 ms
QRS Duration: 94 ms
QTC Calculation (Bezet): 451 ms
QTC Calculation (Bezet): 432 ms
R Axis: 38 degrees
R Axis: 61 degrees
T Axis: 161 degrees
T Axis: 126 degrees
Ventricular Rate: 64 {beats}/min
Ventricular Rate: 56 {beats}/min

## 2014-04-28 LAB — BLOOD GAS, ARTERIAL
Arterial Total CO2: 16.8 mEq/L — ABNORMAL LOW (ref 24.0–30.0)
Base Excess, Arterial: -7.5 mEq/L — ABNORMAL LOW (ref <=2.0)
FIO2: 28 %
HCO3, Arterial: 17.5 mEq/L — ABNORMAL LOW (ref 23.0–29.0)
O2 Flow: 2 L/min
O2 Sat, Arterial: 94.3 % — ABNORMAL LOW (ref 95.0–100.0)
Temperature: 37
pCO2, Arterial: 35.5 mmhg (ref 35.0–45.0)
pH, Arterial: 7.314 — ABNORMAL LOW (ref 7.350–7.450)
pO2, Arterial: 85.5 mmhg (ref 80.0–90.0)

## 2014-04-28 LAB — CALCIUM, IONIZED: Calcium, Ionized: 2.33 mEq/L (ref 2.30–2.58)

## 2014-04-28 LAB — ELECTROLYTES WHOLE BLOOD
Chloride, WB: 106 mEq/L (ref 98–106)
Whole Blood Glucose: 142 mg/dL — ABNORMAL HIGH (ref 70–100)
Whole Blood Potassium: 4.9 mEq/L (ref 3.5–5.3)
Whole Blood Sodium: 137 mEq/L (ref 136–146)

## 2014-04-28 LAB — GLUCOSE WHOLE BLOOD - POCT
Whole Blood Glucose POCT: 189 mg/dL — ABNORMAL HIGH (ref 70–100)
Whole Blood Glucose POCT: 195 mg/dL — ABNORMAL HIGH (ref 70–100)
Whole Blood Glucose POCT: 144 mg/dL — ABNORMAL HIGH (ref 70–100)
Whole Blood Glucose POCT: 105 mg/dL — ABNORMAL HIGH (ref 70–100)

## 2014-04-28 LAB — COOXIMETRY PROFILE
Carboxyhemoglobin: 1.8 % (ref 0.0–3.0)
Hematocrit Total Calculated: 28.1 % — ABNORMAL LOW (ref 37.0–47.0)
Hemoglobin Total: 9.1 g/dL — ABNORMAL LOW (ref 12.0–16.0)
Methemoglobin: 0.9 % (ref 0.0–3.0)
O2 Content: 11.8
Oxygenated Hemoglobin: 91.8 % (ref 85.0–98.0)

## 2014-04-28 LAB — CREATININE WHOLE BLOOD: Whole Blood Creatinine: 5.01 mg/dL (ref 0.40–1.10)

## 2014-04-28 LAB — MAGNESIUM: Magnesium: 2.9 mg/dL — ABNORMAL HIGH (ref 1.6–2.6)

## 2014-04-28 LAB — GFR: EGFR: 11

## 2014-04-28 LAB — HEMOLYSIS INDEX: Hemolysis Index: 6 (ref 0–18)

## 2014-04-28 LAB — HEPATITIS C ANTIBODY: Hepatitis C, AB: NONREACTIVE

## 2014-04-28 SURGERY — TRIPLE LUMEN DIALYSIS CATH
Anesthesia: Local

## 2014-04-28 MED ORDER — GABAPENTIN 300 MG PO CAPS
300.0000 mg | ORAL_CAPSULE | Freq: Two times a day (BID) | ORAL | Status: DC
Start: 2014-04-28 — End: 2014-04-28

## 2014-04-28 MED ORDER — IRON SUCROSE 20 MG/ML IV SOLN
300.0000 mg | INTRAVENOUS | Status: AC
Start: 2014-04-29 — End: 2014-04-29
  Administered 2014-04-29: 300 mg via INTRAVENOUS
  Filled 2014-04-28: qty 15

## 2014-04-28 MED ORDER — INSULIN ASPART 100 UNIT/ML SC SOLN
1.0000 [IU] | SUBCUTANEOUS | Status: DC | PRN
Start: 2014-04-28 — End: 2014-05-16

## 2014-04-28 MED ORDER — ATROPINE SULFATE 0.1 MG/ML IJ SOLN
INTRAMUSCULAR | Status: AC
Start: 2014-04-28 — End: 2014-04-29
  Filled 2014-04-28: qty 10

## 2014-04-28 MED ORDER — FUROSEMIDE 10 MG/ML IJ SOLN
40.0000 mg | Freq: Once | INTRAMUSCULAR | Status: DC
Start: 2014-04-28 — End: 2014-04-28

## 2014-04-28 MED ORDER — LIDOCAINE HCL (PF) 1 % IJ SOLN
INTRAMUSCULAR | Status: AC
Start: 2014-04-28 — End: 2014-04-29
  Filled 2014-04-28: qty 30

## 2014-04-28 MED ORDER — ASPIRIN 325 MG PO TBEC
325.0000 mg | DELAYED_RELEASE_TABLET | Freq: Every day | ORAL | Status: DC
Start: 2014-04-29 — End: 2014-04-29
  Administered 2014-04-29: 325 mg via ORAL
  Filled 2014-04-28: qty 1

## 2014-04-28 MED ORDER — HYDRALAZINE HCL 20 MG/ML IJ SOLN
5.0000 mg | Freq: Four times a day (QID) | INTRAMUSCULAR | Status: DC | PRN
Start: 2014-04-28 — End: 2014-04-28
  Administered 2014-04-28: 5 mg via INTRAVENOUS
  Filled 2014-04-28: qty 1

## 2014-04-28 MED ORDER — HYDROCODONE-ACETAMINOPHEN 5-325 MG PO TABS
1.0000 | ORAL_TABLET | ORAL | Status: DC | PRN
Start: 2014-04-28 — End: 2014-04-28

## 2014-04-28 MED ORDER — ACETAMINOPHEN 325 MG PO TABS
650.0000 mg | ORAL_TABLET | ORAL | Status: DC | PRN
Start: 2014-04-28 — End: 2014-05-16
  Administered 2014-04-29 – 2014-04-30 (×2): 650 mg via ORAL
  Filled 2014-04-28 (×4): qty 2

## 2014-04-28 MED ORDER — ATORVASTATIN CALCIUM 10 MG PO TABS
10.0000 mg | ORAL_TABLET | Freq: Every evening | ORAL | Status: DC
Start: 2014-04-28 — End: 2014-05-05
  Administered 2014-04-28 – 2014-05-04 (×7): 10 mg via ORAL
  Filled 2014-04-28 (×7): qty 1

## 2014-04-28 MED ORDER — GLUCAGON 1 MG IJ SOLR (WRAP)
1.0000 mg | INTRAMUSCULAR | Status: DC | PRN
Start: 2014-04-28 — End: 2014-05-16

## 2014-04-28 MED ORDER — POLYETHYLENE GLYCOL 3350 17 G PO PACK
17.0000 g | PACK | Freq: Every day | ORAL | Status: DC | PRN
Start: 2014-04-28 — End: 2014-05-16

## 2014-04-28 MED ORDER — SODIUM BICARBONATE 650 MG PO TABS
650.0000 mg | ORAL_TABLET | Freq: Two times a day (BID) | ORAL | Status: DC
Start: 2014-04-28 — End: 2014-04-29
  Administered 2014-04-28 – 2014-04-29 (×2): 650 mg via ORAL
  Filled 2014-04-28 (×2): qty 1

## 2014-04-28 MED ORDER — DEXTROSE 50 % IV SOLN
25.0000 mL | INTRAVENOUS | Status: DC | PRN
Start: 2014-04-28 — End: 2014-05-16

## 2014-04-28 MED ORDER — HYDRALAZINE HCL 50 MG PO TABS
100.0000 mg | ORAL_TABLET | Freq: Three times a day (TID) | ORAL | Status: DC
Start: 2014-04-28 — End: 2014-05-16
  Administered 2014-04-28 – 2014-05-16 (×49): 100 mg via ORAL
  Filled 2014-04-28 (×2): qty 4
  Filled 2014-04-28 (×14): qty 2
  Filled 2014-04-28: qty 4
  Filled 2014-04-28 (×12): qty 2
  Filled 2014-04-28: qty 4
  Filled 2014-04-28 (×10): qty 2
  Filled 2014-04-28: qty 4
  Filled 2014-04-28 (×9): qty 2

## 2014-04-28 MED ORDER — LIDOCAINE 5 % EX PTCH
2.0000 | MEDICATED_PATCH | Freq: Every day | CUTANEOUS | Status: DC | PRN
Start: 2014-04-28 — End: 2014-04-28

## 2014-04-28 MED ORDER — ATROPINE SULFATE 0.1 MG/ML IJ SOLN
0.5000 mg | INTRAMUSCULAR | Status: DC | PRN
Start: 2014-04-28 — End: 2014-05-16

## 2014-04-28 MED ORDER — FLUTICASONE-SALMETEROL 250-50 MCG/DOSE IN AEPB
1.0000 | INHALATION_SPRAY | Freq: Two times a day (BID) | RESPIRATORY_TRACT | Status: DC
Start: 2014-04-28 — End: 2014-05-16
  Administered 2014-04-29 – 2014-05-16 (×31): 1 via RESPIRATORY_TRACT
  Filled 2014-04-28 (×4): qty 14

## 2014-04-28 MED ORDER — BISACODYL 10 MG RE SUPP
10.0000 mg | Freq: Every day | RECTAL | Status: DC | PRN
Start: 2014-04-28 — End: 2014-05-16

## 2014-04-28 MED ORDER — DOCUSATE SODIUM 100 MG PO CAPS
100.0000 mg | ORAL_CAPSULE | Freq: Every day | ORAL | Status: DC
Start: 2014-04-29 — End: 2014-05-16
  Administered 2014-04-29 – 2014-05-16 (×14): 100 mg via ORAL
  Filled 2014-04-28 (×17): qty 1

## 2014-04-28 MED ORDER — HEPARIN SODIUM (PORCINE) 5000 UNIT/ML IJ SOLN
5000.0000 [IU] | Freq: Two times a day (BID) | INTRAMUSCULAR | Status: DC
Start: 2014-04-28 — End: 2014-05-14
  Administered 2014-04-28 – 2014-05-14 (×31): 5000 [IU] via SUBCUTANEOUS
  Filled 2014-04-28 (×30): qty 1

## 2014-04-28 MED ORDER — CALCITRIOL 0.25 MCG PO CAPS
0.2500 ug | ORAL_CAPSULE | Freq: Every day | ORAL | Status: DC
Start: 2014-04-29 — End: 2014-05-16
  Administered 2014-04-29 – 2014-05-16 (×17): 0.25 ug via ORAL
  Filled 2014-04-28 (×18): qty 1

## 2014-04-28 MED ORDER — INSULIN ASPART 100 UNIT/ML SC SOLN
1.0000 [IU] | Freq: Three times a day (TID) | SUBCUTANEOUS | Status: DC | PRN
Start: 2014-04-28 — End: 2014-05-16
  Administered 2014-05-07: 2 [IU] via SUBCUTANEOUS
  Administered 2014-05-08 – 2014-05-09 (×4): 1 [IU] via SUBCUTANEOUS
  Administered 2014-05-10: 2 [IU] via SUBCUTANEOUS
  Administered 2014-05-10 – 2014-05-16 (×5): 1 [IU] via SUBCUTANEOUS
  Filled 2014-04-28 (×4): qty 1
  Filled 2014-04-28: qty 2
  Filled 2014-04-28 (×4): qty 1
  Filled 2014-04-28: qty 2
  Filled 2014-04-28 (×2): qty 1

## 2014-04-28 MED ORDER — VENLAFAXINE HCL 25 MG PO TABS
25.0000 mg | ORAL_TABLET | Freq: Two times a day (BID) | ORAL | Status: DC
Start: 2014-04-29 — End: 2014-05-16
  Administered 2014-04-29 – 2014-05-16 (×35): 25 mg via ORAL
  Filled 2014-04-28 (×39): qty 1

## 2014-04-28 MED ORDER — OXYCODONE-ACETAMINOPHEN 5-325 MG PO TABS
1.0000 | ORAL_TABLET | ORAL | Status: DC | PRN
Start: 2014-04-28 — End: 2014-04-28

## 2014-04-28 MED ORDER — PROMETHAZINE HCL 25 MG/ML IJ SOLN
6.2500 mg | Freq: Four times a day (QID) | INTRAMUSCULAR | Status: DC | PRN
Start: 2014-04-28 — End: 2014-04-28

## 2014-04-28 MED ORDER — FUROSEMIDE 10 MG/ML IJ SOLN
80.0000 mg | Freq: Once | INTRAMUSCULAR | Status: DC
Start: 2014-04-28 — End: 2014-04-28

## 2014-04-28 MED ORDER — INSULIN ASPART 100 UNIT/ML SC SOLN
1.0000 [IU] | Freq: Every evening | SUBCUTANEOUS | Status: DC | PRN
Start: 2014-04-28 — End: 2014-05-16
  Administered 2014-05-01 – 2014-05-14 (×4): 1 [IU] via SUBCUTANEOUS
  Filled 2014-04-28 (×3): qty 1

## 2014-04-28 MED ORDER — SODIUM CHLORIDE 0.9 % IV MBP
1000.0000 mg | INTRAVENOUS | Status: DC
Start: 2014-04-28 — End: 2014-04-29
  Administered 2014-04-28: 1000 mg via INTRAVENOUS
  Filled 2014-04-28: qty 1000

## 2014-04-28 MED ORDER — ACETAMINOPHEN 650 MG RE SUPP
650.0000 mg | RECTAL | Status: DC | PRN
Start: 2014-04-28 — End: 2014-05-16

## 2014-04-28 MED ORDER — INSULIN ASPART 100 UNIT/ML SC SOLN
1.0000 [IU] | Freq: Three times a day (TID) | SUBCUTANEOUS | Status: DC | PRN
Start: 2014-04-28 — End: 2014-04-28

## 2014-04-28 MED ORDER — SODIUM CHLORIDE 0.9 % IV BOLUS
100.0000 mL | INTRAVENOUS | Status: AC | PRN
Start: 2014-04-28 — End: 2014-04-29

## 2014-04-28 MED ORDER — NALOXONE HCL 0.4 MG/ML IJ SOLN
0.2000 mg | INTRAMUSCULAR | Status: DC | PRN
Start: 2014-04-28 — End: 2014-05-16

## 2014-04-28 MED ORDER — ACETAMINOPHEN 325 MG PO TABS
650.0000 mg | ORAL_TABLET | ORAL | Status: DC | PRN
Start: 2014-04-28 — End: 2014-05-16
  Administered 2014-04-29: 650 mg via ORAL

## 2014-04-28 MED ORDER — NIFEDIPINE ER OSMOTIC RELEASE 90 MG PO TB24
90.0000 mg | ORAL_TABLET | Freq: Every day | ORAL | Status: DC
Start: 2014-04-29 — End: 2014-04-28

## 2014-04-28 MED ORDER — VENLAFAXINE HCL 37.5 MG PO TABS
75.0000 mg | ORAL_TABLET | Freq: Two times a day (BID) | ORAL | Status: DC
Start: 2014-04-28 — End: 2014-04-28
  Filled 2014-04-28: qty 2

## 2014-04-28 NOTE — Progress Notes (Signed)
[facility]  Progress Note      Date Time: 04/28/2014 4:56 PM  Patient Name: [name], [name]     Assessment:   Junctional bradycardia  CAD s/p CABG  Chronic renal insufficiency  Isch CM  Mild  Aortic stenosis  Plan:   Will plan to stabilize medically ,  Observe here and try to decrease volume with diuretics or dialysis prior to pacer implant. At this point her anesthesia risk is increased and can wait till she has improved metabolic and volume status.  I will speak to Dr Leighton Roach regarding starting dialysis first and the possible pacer next week.      Signed by: Fortino Sic, MD  [facility])  [phone]  MD LINE [phone]    Patient Active Problem List   Diagnosis   . NSTEMI (non-ST elevated myocardial infarction)   . Hypertensive urgency   . PAD (peripheral artery disease)   . Arterial leg ulcer   . Chronic obstructive pulmonary disease, unspecified COPD type   . Ischemic cardiomyopathy   . CAD (coronary artery disease)   . Diabetes   . Chronic kidney disease   . Debilitated   . Complete heart block   . Junctional bradycardia   . Hyperkalemia   . Renal failure   . Bradycardia   . Acute on chronic renal failure   . Encephalopathy   . Azotemia       Subjective:   Denies chest pain, SOB or palpitations.  Difficult to arouse, awaken. Does opens eyes to voice command.    Medications:     Current Facility-Administered Medications   Medication Dose Route Frequency Provider Last Rate Last Dose   . acetaminophen (TYLENOL) tablet 650 mg  650 mg Oral Q4H PRN Silis, Charlann Noss, MD        Or   . acetaminophen (TYLENOL) suppository 650 mg  650 mg Rectal Q4H PRN Silis, Charlann Noss, MD       . acetaminophen (TYLENOL) tablet 650 mg  650 mg Oral Q4H PRN Silis, Charlann Noss, MD       . Melene Muller ON 04/29/2014] aspirin EC EC tablet 325 mg  325 mg Oral Daily Silis, Charlann Noss, MD       . atorvastatin (LIPITOR) tablet 10 mg  10 mg Oral QHS Silis, Charlann Noss, MD       . bisacodyl (DULCOLAX) suppository 10 mg  10 mg Rectal QD PRN  Silis, Charlann Noss, MD       . Melene Muller ON 04/29/2014] calcitRIOL (ROCALTROL) capsule 0.25 mcg  0.25 mcg Oral Daily Silis, Charlann Noss, MD       . dextrose 50 % bolus 25 mL  25 mL Intravenous PRN Silis, Charlann Noss, MD       . Melene Muller ON 04/29/2014] docusate sodium (COLACE) capsule 100 mg  100 mg Oral Daily Silis, Charlann Noss, MD       . fluticasone-salmeterol (ADVAIR DISKUS) 250-50 MCG/DOSE 1 puff  1 puff Inhalation BID Silis, Charlann Noss, MD       . gabapentin (NEURONTIN) capsule 300 mg  300 mg Oral BID Silis, Charlann Noss, MD       . glucagon (rDNA) (GLUCAGEN) injection 1 mg  1 mg Intramuscular PRN Silis, Charlann Noss, MD       . heparin (porcine) injection 5,000 Units  5,000 Units Subcutaneous Q12H SCH Silis, Charlann Noss, MD       . hydrALAZINE (APRESOLINE) injection 5 mg  5 mg Intravenous Q6H  PRN Silis, Charlann Noss, MD   5 mg at 04/28/14 1619   . hydrALAZINE (APRESOLINE) tablet 100 mg  100 mg Oral Q8H SCH Silis, Charlann Noss, MD       . HYDROcodone-acetaminophen (NORCO) 5-325 MG per tablet 1 tablet  1 tablet Oral Q4H PRN Silis, Charlann Noss, MD       . insulin aspart (NovoLOG) injection 1-5 Units  1-5 Units Subcutaneous TID AC PRN Silis, Charlann Noss, MD       . Melene Muller ON 04/29/2014] iron sucrose (VENOFER) 300 mg in sodium chloride 0.9 % 250 mL IVPB  300 mg Intravenous Q24H SCH Silis, Charlann Noss, MD       . lidocaine (LIDODERM) 5 % 2 patch  2 patch Transdermal QD PRN Silis, Charlann Noss, MD       . naloxone (NARCAN) injection 0.2 mg  0.2 mg Intravenous PRN Silis, Charlann Noss, MD       . Melene Muller ON 04/29/2014] NIFEdipine XL (PROCARDIA XL) 24 hr tablet 90 mg  90 mg Oral Daily Silis, Charlann Noss, MD       . oxyCODONE-acetaminophen (PERCOCET) 5-325 MG per tablet 1 tablet  1 tablet Oral Q4H PRN Silis, Charlann Noss, MD       . polyethylene glycol (MIRALAX) packet 17 g  17 g Oral QD PRN Silis, Charlann Noss, MD       . promethazine (PHENERGAN) injection 6.25 mg  6.25 mg Intravenous Q6H PRN Silis, Charlann Noss, MD       . sodium bicarbonate tablet 650 mg  650 mg Oral BID Silis, Charlann Noss, MD        . venlafaxine [facility]) tablet 75 mg  75 mg Oral BID Silis, Charlann Noss, MD           Physical Exam:     Filed Vitals:    04/28/14 1536   BP:    Pulse:    Temp:    Resp:    SpO2: 92%     Temp (24hrs), Avg:97 F (36.1 C), Min:95.4 F (35.2 C), Max:97.7 F (36.5 C)      Telemetry reviewed no changes.     Intake and Output Summary (Last 24 hours) at Date Time  No intake or output data in the 24 hours ending 04/28/14 1656    General Appearance:  Breathing comfortable, no acute distress, sleepy  Head:  normocephalic  Eyes:  EOM's intact  Neck:  No carotid bruit , ++ jugular venous distension, brisk carotid upstroke  Lungs:  Clear to auscultation throughout, no wheezes, rhonchi or rales, dull at bases fair resp effort  Chest Wall:  No tenderness or deformity, + scar  Heart:  S1, S2 normal, no S3, no S4, no murmur, PMI not displaced, no rub   Abdomen:  Soft, non-tender, positive bowel sounds, no hepatojugular reflux  Extremities:  No cyanosis, clubbing, mild  edema  Pulses:  Equal radial pulses, 4/4 symmetric  Neurologic: sleepy , responds to voice command, looks to examiner     Labs:   Recent CMP   Recent Labs      04/28/14   0918   GLUCOSE  213*   BUN  103*   CREATININE  4.8*   SODIUM  135*   CO2  16*   CALCIUM  8.8     Recent CARDIAC ENZYMES No results for input(s): TROPONIN, ISTATTROPONI, CK in the last 24 hours.    Invalid input(s): TROPONINT, CKMB[24  Recent TSH Invalid input(s): TSH[24  Recent  PT/PTT No results for input(s): INR, PTT in the last 24 hours.    Invalid input(s): PTI, COUM, COUMP, ACOAG, ACOAP[24  Recent CBC WITH DIFF No results for input(s): RBC, HGB, HCT, MCV, MCHC, RDW, MPV, PLT in the last 24 hours.    Invalid input(s): WBCIR, ADIFF, REFLX, CANCL, BAND, ABAND[24  Recent LIPID PANEL No results for input(s): CHOL, TRIG, HDL in the last 24 hours.    Invalid input(s): LDLC, VLDLC, LRAT[24  Recent ABG   Recent Labs      04/28/14   1647   FIO2  28         Rads:   Radiological Procedure reviewed.     ECG- junctional bradycardia.

## 2014-04-28 NOTE — Progress Notes (Addendum)
Madison Edward, MD  [facility]  Tel:  [phone]      Assessment :   Patient is a 64 year old female with history of coronary artery disease status post coronary artery bypass grafting December 2015, nonhealing right lower extremity ulcer, hypertension, hyperlipidemia, chronic kidney disease, mild to mod AS, ICM LVEF 45% presents to the ED with complaints of fatigue, dizziness, found to be significantly bradycardic in junctional rhythm with Acute on CRI   Bradycardia:  Continues to have junctional rhythm with retrograde P waves . Off BB for 6 days   Acute on chronic diastolic heart failure : No signficant improvement clinically, but with worsening creatinine   CAD S/p prior PCI , recent CABGx2 02/2014: Currently stable   Acute on chronic renal insufficiency, likely in the setting of significant bradycardia; Now with ATN.     Poor healing ankle ulcer ( recent Peripheral angio no sig obstructive dz and hence not intervened upon   Ischemic cardiomyopathy, LVEF of 40 percent: , currently improved to 50-55 percent Was on  beta blocker and hydralazine as an outpatient.  No ACEI, ARB due to  CRI. Now off BB   HTN: better control on hydralazine and Procardia    Aortic stenosis .  Moderate by prior echo but mild by most recent echo   Plan:     Patient continues to be in junctional rhythm and hence will need a pacemaker   Her volume status  not significantly better clincally although does not appear worse . now with worsening creatinine  and falling urine output  Discussed with nephrology , we will await BMP and chest x-ray from today, before giving her further Lasix         dw Dr Glean Hess . Would recommend optimization of her  Metabolic status prior to PPM. Pt will be Tx to [facility] , since she if she needs a PPM emergently at any time, it can be arranged.    D/w Dr Karilyn Cota- nephrology     Cardiology Diagnostics     Telemetry:  (I have personally reviewed the telemetry strips)  Junctional rhythm with  retrograde P waves.         ECHO 04/2014;  1.Technically limited study  2. Normal left ventricular size and systolic function, estimated EF  50-55%  3. Mild to moderate left ventricular hypertrophy  4. Mild aortic stenosis  5. Moderate tricuspid regurgitation  6. Moderate pulmonary hypertension  7. Moderately dilated left atrium    Problem list     Patient Active Problem List   Diagnosis   . NSTEMI (non-ST elevated myocardial infarction)   . Hypertensive urgency   . PAD (peripheral artery disease)   . Arterial leg ulcer   . Chronic obstructive pulmonary disease, unspecified COPD type   . Ischemic cardiomyopathy   . CAD (coronary artery disease)   . Diabetes   . Chronic kidney disease   . Debilitated   . Complete heart block   . Junctional bradycardia   . Hyperkalemia          Events/ROS/ Subjective since last 24 hrs:         Shortness of breath, no worse   Complaints of abdominal discomfort , may be constipation     Objective:   Vitals reviewed     VS: BP 158/102 mmHg  Pulse 52  Temp(Src) 97.4 F (36.3 C) (Oral)  Resp 27  Ht 1.549 m (5\' 1" )  Wt 79.9 kg (176 lb 2.4 oz)  BMI 33.30 kg/m2  SpO2 95%    Intake/Output Summary (Last 24 hours) at 04/28/14 0908  Last data filed at 04/28/14 0400   Gross per 24 hour   Intake    350 ml   Output    300 ml   Net     50 ml       Constitutional :some respiratory distress with pursed lip breathing   Respiratory:  Poor air movement and respiratory effort  bilaterally.  Decreased BS at the bases  Cardiovascular: Regular  S1 and S2.. 2/6 SM at the LSB No carotid bruits. Cannot assess JVD  Dorsal pedis  Left feeble. Right bandaged. Trace edema b/l   Gastrointestinal Soft. Non-tender. Normoactive BS. No abdominal bruits  Psychiatric: AAO X3.  Normal mood and effect.    Laboratory Studies:   (I have personally reviewed the laboratory values below)      CBC w/Diff     Recent Labs  Lab 04/25/14  1816 04/22/14  1409   WBC 6.27 4.94   HEMOGLOBIN 9.6* 9.7*   HEMATOCRIT 30.8* 30.5*    PLATELETS 208 222          Basic Metabolic Profile     Recent Labs  Lab 04/27/14  0934 04/26/14  0430 04/25/14  0414   SODIUM 135* 136 135*   POTASSIUM 5.6* 5.3* 4.9   CHLORIDE 107 109 109   CO2 16* 16* 18*   BUN 96* 83* 89*   CREATININE 4.0* 3.0* 3.4*   EGFR 13.6 18.9 16.4   GLUCOSE 196* 182* 123*   CALCIUM 8.9 8.7 8.6            Cardiac Enzymes     Recent Labs  Lab 04/22/14  1407   TROPONIN I 0.03          Thyroid Studies     Recent Labs  Lab 04/24/14  0332   THYROID-STIMULATING HORMONE (TSH) BASELINE 5.74*          Cholesterol Panel            Coagulation Studies     Recent Labs  Lab 04/22/14  1409   PT 16.2*   PT INR 1.3*              Current Medications   Meds reviewed:    Current Facility-Administered Medications   Medication Dose Route Frequency   . aspirin EC  325 mg Oral Daily   . atorvastatin  10 mg Oral QHS   . calcitRIOL  0.25 mcg Oral Daily   . docusate sodium  100 mg Oral Daily   . fluticasone-salmeterol  1 puff Inhalation BID   . gabapentin  300 mg Oral BID   . heparin (porcine)  5,000 Units Subcutaneous Q12H [facility]   . hydrALAZINE  100 mg Oral Q8H SCH   . iron sucrose  300 mg Intravenous Q24H SCH   . NIFEdipine ER  90 mg Oral Daily   . sodium bicarbonate  650 mg Oral BID   . venlafaxine  75 mg Oral BID                Madison Edward, MD  04/28/2014 9:08 AM

## 2014-04-28 NOTE — Plan of Care (Signed)
Problem: Health Promotion  Goal: Knowledge - disease process  Extent of understanding conveyed about a specific disease process.   Outcome: Progressing    Problem: Safety  Goal: Patient will be free from injury during hospitalization  Outcome: Progressing    Problem: Pain  Goal: Patient's pain/discomfort is manageable  Outcome: Progressing    Problem: Psychosocial and Spiritual Needs  Goal: Demonstrates ability to cope with hospitalization/illness  Outcome: Progressing    Problem: Moderate/High Fall Risk Score >/=15  Goal: Patient will remain free of falls  Outcome: Progressing    Problem: Potential for Compromised Skin Integrity  Goal: Nutritional status is improving  Monitor and assess patient for malnutrition (ex- brittle hair, bruises, dry skin, pale skin and conjunctiva, muscle wasting, smooth red tongue, and disorientation). Collaborate with interdisciplinary team and initiate plan and interventions as ordered. Monitor patient's weight and dietary intake as ordered or per policy. Utilize nutrition screening tool and intervene per policy. Determine patient's food preferences and provide high-protein, high-caloric foods as appropriate.   Outcome: Progressing    Problem: Tissue integrity  Goal: Damaged tissue is healing and protected  Outcome: Progressing    Problem: Urinary Incontinence  Goal: Perineal skin integrity is maintained or improved  Assess genitourinary system, perineal skin, labs (urinalysis), and history of incontinence to include past management, aggravating, and alleviating factors. Collaborate with interdisciplinary team and initiate plans and interventions as needed.   Outcome: Progressing    Problem: Hemodynamic Status: Cardiac  Goal: Stable vital signs and fluid balance  Outcome: Progressing    Problem: Inadequate Tissue Perfusion  Goal: Adequate tissue perfusion will be maintained  Outcome: Progressing

## 2014-04-28 NOTE — Progress Notes (Signed)
I called Marcha Dutton and gave report to the [facility] nurse.  She expressed concern with the fact that the patient was transferring from the CCU unit at [facility] to the [facility] [place].  I spoke with the case manager, and he assured me that both Dr. Eden Emms and Dr Betti Cruz agreed that the patient should be transferred to the [facility] unit at [facility].  It would then be the responsibility from the admitting side to admit the patient to an IMCU bed. A half an hour later, the [place] nurse called back again to make sure all appropriate orders were in place for the patient to be IMCU status, I informed her of what case management said and she had me speak to her superior.  It was at this time that the charge nurse Merlyn Albert informed me that the patient was actually a IMCU border in the CCU.  I told this to the nurse from [place]. Will give report to EMS now. Pt was wheeled out on a stretcher on

## 2014-04-28 NOTE — Plan of Care (Signed)
Pt very difficult to arouse. Although vitals are stable pt is extremely lethargic. Dr. Eden Emms paged, awaiting call back. Dr. Glean Hess, cardiologist called by charge nurse, ABG stat in place. Pt in junctional rhythm at the moment.

## 2014-04-28 NOTE — Progress Notes (Signed)
ID Full note dictared  A UTI  PLAN ertapenem

## 2014-04-28 NOTE — Progress Notes (Signed)
Spoke to nephrologist, he asked for stat placement of dialysis cath, placed a call to IR for line placement for him. Dialysis nurse is on her way to HD pt.    Dr. Glean Hess paged, is aware pt is brady's down to 41-42, pressure stable.    Dr. Samuel Germany aware of new admission, waiting for orders

## 2014-04-28 NOTE — Progress Notes (Signed)
PROGRESS NOTE    Date Time: 04/28/2014 8:14 AM  Patient Name: [name],[name]      Subjective:    less dyspnea this morning           Review of Systems:   A comprehensive review of systems was: General ROS: negative for - chills, fever or night sweats  ENT ROS: negative for - headaches, nasal congestion, sinus pain or visual changes  Respiratory ROS: negative for - cough, orthopnea, shortness of breath or wheezing  Cardiovascular ROS: negative for - chest pain, dyspnea on exertion, orthopnea or shortness of breath  Gastrointestinal ROS: negative for - abdominal pain, constipation, diarrhea or nausea/vomiting  Genito-Urinary ROS: negative for - change in urinary stream, dysuria, hematuria or nocturia  Musculoskeletal ROS: negative for - joint pain, joint stiffness or joint swelling  Neurological ROS: negative for - confusion, dizziness, headaches, memory loss or speech problems    Physical Exam:     Filed Vitals:    04/28/14 0624   BP:    Pulse: 50   Temp:    Resp: 25   SpO2:        General appearance - alert, well appearing, and in no distress  Mental status - alert, oriented to person, place, and time  Eyes - pupils equal and reactive, extraocular eye movements intact  Nose - normal and patent, no erythema, discharge or polyps  Mouth - mucous membranes moist, pharynx normal without lesions  Neck - supple, no significant adenopathy  Lymphatics - no palpable lymphadenopathy, no hepatosplenomegaly  Chest - clear to auscultation, no wheezes, rales or rhonchi, symmetric air entry  Heart - normal rate, regular rhythm, normal S1, S2, no murmurs, rubs, clicks or gallops  Abdomen - soft, nontender, nondistended, no masses or organomegaly  Neurological - alert, oriented, normal speech, no focal findings or movement disorder noted  Musculoskeletal - no joint tenderness, deformity or swelling  Extremities - peripheral pulses normal, no pedal edema, no clubbing or cyanosis  Skin -  Pos wound base clean     Medications:     Current  Facility-Administered Medications   Medication Dose Route Frequency   . aspirin EC  325 mg Oral Daily   . atorvastatin  10 mg Oral QHS   . calcitRIOL  0.25 mcg Oral Daily   . docusate sodium  100 mg Oral Daily   . fluticasone-salmeterol  1 puff Inhalation BID   . furosemide  40 mg Intravenous BID   . gabapentin  300 mg Oral BID   . heparin (porcine)  5,000 Units Subcutaneous Q12H [facility]   . hydrALAZINE  100 mg Oral Q8H SCH   . iron sucrose  300 mg Intravenous Q24H [facility]   . NIFEdipine ER  90 mg Oral Daily   . sodium bicarbonate  650 mg Oral BID   . venlafaxine  75 mg Oral BID       Intake and Output Summary (Last 24 hours) at Date Time    Intake/Output Summary (Last 24 hours) at 04/28/14 0814  Last data filed at 04/28/14 0400   Gross per 24 hour   Intake    350 ml   Output    300 ml   Net     50 ml           Labs:       Recent Labs  Lab 04/27/14  0934 04/26/14  0430 04/25/14  0414  04/22/14  1409   SODIUM 135* 136 135* More  results in Results Review 135*   POTASSIUM 5.6* 5.3* 4.9 More results in Results Review 5.2*   CHLORIDE 107 109 109 More results in Results Review 108   CO2 16* 16* 18* More results in Results Review 18*   BUN 96* 83* 89* More results in Results Review 91*   CREATININE 4.0* 3.0* 3.4* More results in Results Review 3.5*   CALCIUM 8.9 8.7 8.6 More results in Results Review 8.5   ALBUMIN  --   --   --   --  2.9*   PROTEIN, TOTAL  --   --   --   --  6.6   BILIRUBIN, TOTAL  --   --   --   --  0.3   ALKALINE PHOSPHATASE  --   --   --   --  170*   ALT  --   --   --   --  88*   AST (SGOT)  --   --   --   --  35*   GLUCOSE 196* 182* 123* More results in Results Review 228*   More results in Results Review = values in this interval not displayed.    Recent Labs  Lab 04/25/14  1816   WBC 6.27   HEMOGLOBIN 9.6*   HEMATOCRIT 30.8*   PLATELETS 208               Rads:     Radiology Results (24 Hour)     Procedure Component Value Units Date/Time    XR Chest 2 Views [[id number]] Collected:  04/27/14 0900    Order  Status:  Completed Updated:  04/27/14 0908    Narrative:      CLINICAL INDICATION: dyspnea chf infiltrates post Lasix    COMPARISON: 04/26/2014    INTERPRETATION: Frontal and lateral views of the chest were obtained..   There is stable cardiomegaly. There is mildly improving interstitial  edema. There is a small right pleural effusion. Post sternotomy wires  are noted.      Impression:       Mildly improving interstitial edema. There is a stable small  right pleural effusion.     Madison Portela, MD   04/27/2014 9:04 AM              Assessment:      The patient is a 64 year old female admitted with bradycardia, chronic  kidney disease, acute kidney injury, heart failure, cardiomyopathy,  diabetes, and history of chronic wound.     AKI    Stable for transfer to [facility].      Plan:     The plan will be for transfer to [facility].  Nephrology  will further optimize her acute kidney injury.  The patient may need  dialysis.  The patient is being transferred to [facility] in  the case that she needs more emergent intervention in terms of her  bradycardia.  The patient is being optimized for pacemaker placement when  she is medically optimized.  Will continue   Daily, , Advair  250/50 one puff b.i.d., low-dose heparin, IV Venofer.  Await further  recommendations from Cardiology and Nephrology.  Nephrology will manage the  patient both here and at [facility].  Her condition is  guarded.        Signed by: Madison Dawley, MD  04/28/2014  8:14 AM

## 2014-04-28 NOTE — Treatment Plan (Signed)
Severe Sepsis Screen  Date: 04/28/2014 Time: 9:49 PM  Nurse Signature: Misty Stanley Eilee Schader    A. Infection:    Does your patient have ONE or more of the following infection criteria?     [x]  Documented Infection - Does the patient have positive culture results (from blood,        sputum, urine, etc)?   [x]  Anti-Infective Therapy - Is the patient receiving antibiotic, antifungal, or other                anti-infective therapy?   []  Pneumonia - Is there documentation of pneumonia (X Ray, etc)?   []  WBC's - Have WBC's been found in normally sterile fluid (urine, CSF, etc.)?   []  Perforated Viscus -Does the patient have a perforated hollow organ (bowel)?    A.  Did you check any of the boxes above?    []  No  If No, Stop Here and Sepsis Screen Negative.               [x]  Yes, continue to section B      B. SIRS:     Does your patient have TWO or more of the following SIRS criteria (ensure vital signs & temperature are within 1 hour of this screening)?    []  Temperature - Is the patient's temperature: Temp: 97.4 F (36.3 C) (04/28/14 2045)   - Greater than or equal to 38.3 degrees C (greater than 100.9 degrees F)?   - Less than or equal to 36 degrees C (less than or equal to 96.8 degrees F)?    []  Heart Rate: Heart Rate: 64 (04/28/14 2145)   - Is the patient's heart rate greater than or equal to 90 bpm?    [x]  Respiratory: Resp Rate: 21 (04/28/14 2145)   - Is the patient's respiratory rate greater than or equal to 20?    []  WBC Count - Is the patient's WBC count:   Recent Labs  Lab 04/25/14  1816   WBC 6.27      - Greater than or equal to 12,000/mm3 OR   - Less than or equal to 4,000/mm3 OR    - Are there greater than 10% immature neutrophils (bands)?    []  Glucose >140 without diabetes?   Recent Labs  Lab 04/28/14  0918   GLUCOSE 213*       []  Significant edema?    B.  Did you check two or more of the boxes above?     [x]  No, Stop Here and Sepsis Screen Negative   []  Yes, continue to section C    C.  Acute Organ Dysfunction      Does your patient have ONE or more of the following organ dysfunction? (May need to wait for lab results for assessment - see below) Organ dysfunction must be a result of the sepsis not chronic conditions.    []  Cardiovascular - Does the patient have a: BP: (!) 176/110 mmHg (04/28/14 2145)   -systolic Blood pressure less than or equal to 90 mmHg OR   -systolic blood pressure has dropped 40 mmHg or more from baseline OR   -mean arterial pressure less than or equal to 70 mmHg (for at least one hour   despite fluid resuscitation OR require vasopressor support?  []  Respiratory - Does the patient have new hypoxia defined by any of the following"   -A sustained increase in oxygen requirements by at least 2L/min on NC or 28%    FiO2 within  the last 24 hrs OR   -A persistent decrease in oxygen saturation of greater than or equal to 5% lasting   at least four or more hours and occurring within the last 24 hours  []  Renal - Does the patient have:   -low urine output (e.g. Less than 0.11mL/kg/HR for one hour despite adequate fluid    resuscitation, OR   -Increased creatinine (greater than 50% increase from baseline) OR   -require acute dialysis?  []  Hematologic - Does the patient have a:   -Low platelet count (less than 100,000 mm3)   Recent Labs  Lab 04/25/14  1816   PLATELETS 208   OR   -INR/aPTT greater than upper limit of normal?  Recent Labs  Lab 04/22/14  1409   PT INR 1.3*    or                    []  Metabolic - Does the patient have a high lactate (plasma lactate greater than or equal to 2.4 mMol/L)?   Recent Labs  Lab 04/28/14  1647   LACTIC ACID 1.3       []  Hepatic - Are the patient's liver enzymes elevated (ALT greater than 72 IU/L or Total      Bilirubin greater than 2 MG/dL)?    Recent Labs  Lab 04/22/14  1409   BILIRUBIN, TOTAL 0.3   ALT 88*     []  CNS - Does the patient have altered consciousness or reduced Glasgow Coma      Scale?    C.  Did you check any of the boxes above?     []  No, Sepsis Screen  Negative   []  YES:  A) Infection + B) SIRS + C) Organ Dysfunction = Positive Screen for Severe Sepsis     Notify MD and document in Complex Assessment under provider notification   - Name of physician notified:                                         - Date/Time Notifiied:                                       Document actions: Following must be completed within 1 hour of positive sepsis screen   []  Lactate drawn (if initial lactate > , repeat lactate in 2 hours for goal decrease 10-20%)   []  Blood Cultures obtained (prior to antibiotic administration; if not done within the last 48 hours)   []  Antibiotics initiated or modified    []   IV Fluid administered 0.9% NS _______ mLs given (Initial Bolus of 30 ml/kg if SBP < 90 or MAP < 65 or lactate greater than 4 mmol/dl)  Nursing Comments?:     _________________________________________________________________    Patients meeting the following criteria are excluded from screening (check if applicable):   []  [place] hypothermia protocol   []  Comfort Care orders   []  Surgery- No screening for 24 hours after surgery (48 hours after CV surgery)       -If Yes. Date of surgery:______________________   []  Admitted with sepsis and until 72 hours after admission with documented sepsis (RESUME SEPSIS SCREEN AFTER 72 hour window!!!)      -If Yes, Date of Documented Sepsis:______________________   []  Positive  screen AND Completed sepsis bundle during previous 24 hours       -If Yes, Date/Time of positive severe sepsis screen:_______________________

## 2014-04-28 NOTE — Progress Notes (Signed)
While transport was getting the patient to the stretcher and went to the check the vitals, the patient informed the transporter that she has an extremity restriction to the left arm d/t her open heart surgery in the past.  I looked in the chart and didn't see an order for this, so I asked the patient to tell the first doctor that she sees at [facility] to get an order for this in the computer.  She is a/ox4 and she said she would do this.  The transporter also informed me that he would pass along this information to the admitting nurse.

## 2014-04-28 NOTE — PT Progress Note (Signed)
Physical Therapy Note    [facility]  [address]  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 Nursing just gave pt suppository due to constipation reported x 6 days. Also of note:  Results for [name] (MRN [id number]) as of 04/28/2014 14:09   Ref. Range 04/28/2014 09:18   BUN Latest Ref Range: 7-19 mg/dL 784 (H)   Creatinine Latest Ref Range: 0.6-1.0 mg/dL 4.8 (H)   Sodium Latest Ref Range: 136-145 mEq/L 135 (L)   Potassium Latest Ref Range: 3.5-5.1 mEq/L 5.3 (H)     Assessment   Pt lethargic and limited by decreased coordination with therapeutic exercises, transfer and ambulation training and decreased balance overall. Unsure if tremor and incoordination are related to potassium level or kidney function.   Plan   Recommendation  Discharge Recommendation: Home with supervision, Home with home health PT, Acute Rehab  PT Frequency: 3-4x/wk      Continue plan of care if not discharged to [facility] for pacemaker insertion (per nsg, transfer today, sx tomorrow)    Interdisciplinary Communication: Nursing made aware of session outcome.    Subjective   Pt very somnolent initially, twitching more upon waking, appeared initially disoriented.  Patient is agreeable to participation in the therapy session. Nursing clears patient for therapy.  Pain: None reported/indicated    Vitals: HR 50's, BP  160's/70's, SpO2 97-95% on 4 L/min  Filed Vitals:    04/28/14 1030 04/28/14 1100 04/28/14 1130 04/28/14 1200   BP: 167/74  179/109    Pulse: 54 52 52 53   Temp:    97.7 F (36.5 C)   TempSrc:    Rectal   Resp: 45 20 32 22   Height:       Weight:       SpO2: 93% 95% 92% 94%       Objective     Precautions/ Contraindications:  Fall, contact isolatoin    Patient is in bed with  Telemetry, Intravenous (IV), O2 at 4 liters/minute via NC , Blood Pressure Cuff and Dressing RLE in place.    Therapeutic exercises:  Ankle Pumps: 10  Heel Slides: 10  Hip abduction/adduction: 10 in hooklying  Straight leg raise: 10 AAROM  Short Arc Quad: 10  Long Arc Quad: 10    Functional Mobility:  Rolling: CG  Supine to Sit: CG  Scooting: Min A  Sit to Supine: Min A  Sit to Stand: Min A  Stand to Sit: Min A  Transfer Training: Min A on/off EOB, toilet    Gait:   WB status: FWB  Assistive Device: none  Assist Level: HHA  Distance: 4-5 steps  Pattern: narrow BOS, unsteady with tendency to lean posterior     Balance Training:  EOB reaches with CG, standing  UE placement with min A for balance    Educated the patient to role of physical therapy, plan of care, goals  of therapy and HEP, safety with mobility and ADLs, energy conservation techniques, pursed lip breathing, sternal precautions.    Patient left on toilet with nursing present and with all needs met, equipment intact and call bell within reach. RN notified of session outcome.   Mobility status posted at bedside and within E.M.R.    Goals per Eval/ Re-eval:   Goals  Goal Formulation: With patient  Time for Goal Acheivement: By time of discharge  Goals: Select goal  Pt Will Go Supine To Sit: independent, to maximize functional mobility and independence  Pt Will Perform Sit to Stand: modified independent, to maximize functional mobility and independence  Pt Will Ambulate: 101-150 feet, with four wheel walker, with supervision, to maximize functional mobility and independence  Pt Will Go Up  / Down Stairs: 1-2 stairs, with supervision, With rail    Signature:  Chester Holstein, PT  04/28/2014 1:58 PM   Phone: [phone]

## 2014-04-28 NOTE — Progress Notes (Signed)
Unable to complete wound care consult at this time secondary to RRT team responding for hemodynamic concerns.  Will make another attempt at a later time.      Sheppard Plumber RN, MSN  Insurance account manager  [phone]

## 2014-04-28 NOTE — Plan of Care (Addendum)
VTE/PE Risk Screening  Complete Upon Admission and Transfer to Different Level of Care  Completed by nurse: Rennie Plowman 04/28/2014 3:55 PM   -----------------------------------------------------------------------------------------------------------  SECTION 1 - Risk Screening     []   Patient currently receiving anticoagulation therapy (Heparin, Lovenox, Coumadin, Pradaxa, Xarelto, Eliquis or Arixtra Only) and received 1 dose within 24 hours of admission STOP HERE   [x]   VTE/PE prophylaxis currently prescribed elsewhere - STOP HERE   []   Comfort Care - STOP HERE   []   Clinical Trials - STOP HERE    Contraindications: Patients with a history of the following conditions cannot haveSequential compression devices (SCD     []  Any of these conditions present , Call MD for pharmacological prophylaxis or ask MD to document reason for not having both mechanical and pharmacologic VTE prophylaxis   []  Post-op vein ligation   []  Suspected VTE   []  Cellulitis/Dermatitis of the leg   []  Severe ischemic Vascular disease   []  Edema related to Congestive Heart Faliure   []  Gangrene   []  Recent skin graft  -----------------------------------------------------------------------------------------------------------  SECTION 2 - Risk Factors (Check all that apply)    Moderate Risk Factors   []   Heart Failure (current or history of)   []   Respiratory Failure   []   Acute Myocardial Infarction (AMI)   []   Acute Infection   []   Rheumatologic Disorder   []   Elderly age (64 years old)   []   Ongoing hormonal treatment / estrogen use (including Tamoxifen, Raloxifene)   []   Obesity (BMI >/= 30kg/m2)    High Risk Factors   []   Recent (</= 1 month) trauma/surgery    Highest Risk Factors   []   Active Cancer   []   Previous VTE   []   Reduced mobility (>24 hrs; current or anticipated)   []   Known thrombophilic condition (hematological disorders that promote thrombosis)      []   No boxes checked in this section indicate patient is at low risk for VTE.  No VTE Prophylaxis indicated.       []   One or more risk factors present, enter an EPIC order for Sequential compression devices (SCD). Use per protocol, MD signature required.

## 2014-04-28 NOTE — Plan of Care (Signed)
AM labs ordered.

## 2014-04-28 NOTE — H&P (Signed)
ADMISSION HISTORY AND PHYSICAL EXAM    Date Time: 04/28/2014 4:45 PM  Patient Name: [name]  Attending Physician: Mechele Dawley, MD      History of Present Illness:   [name] is a 64 y.o. female who presents to the hospital with hx CAD s/p CABG 2015 non healing RLE ulcer  HTN HLD CKD AS EF 45 %     Presents to [facility] dizziness fatigue brady junctional rhythm  Acute on CRF    [facility]   For eval acute on CRF and pacer     Temp 95.4 SBP 185 pulse 45  Bun 103 crt 4.8      Past Medical History:     Past Medical History   Diagnosis Date   . Diabetes mellitus    . Chronic kidney disease    . Myocardial infarction    . Hypertension    . Hyperlipidemia    . Peripheral arterial disease      06/28/13 right superficial femoral artery stenosis, tibial peroneal trunk stenosis, left CVL a stenosis, treated with angiography and angioplasty.   . Coronary artery disease      Left circumflex artery presumed DES.,  Chronically occluded right coronary artery stent   . Anemia      She redid to chronic disease and iron deficiency.     . Arterial leg ulcer      2014, receiving home health   . Chronic obstructive pulmonary disease      Previously on Spiriva and Advair       Past Surgical History:     Past Surgical History   Procedure Laterality Date   . Cardiac angiography and angioplasty  06/2013   . Colonoscopy  Unknown   . Tubal ligation     . Coronary artery bypass N/A 03/04/2014     Procedure: CORONARY ARTERY BYPASS;  Surgeon: Austin Miles, MD;  Location: [facility] HEART OR;  Service: Cardiovascular;  Laterality: N/A;  LIMA to LAD  SVG to OM   . Endoscopic,vein harvest Left 03/04/2014     Procedure: ENDOSCOPIC,VEIN HARVEST;  Surgeon: Austin Miles, MD;  Location: [facility] HEART OR;  Service: Cardiovascular;  Laterality: Left;  By D. Lazarus Gowda, RNFA. Left groin to mid-calf.   Rhae Hammock N/A 03/04/2014     Procedure: TEE;  Surgeon: Austin Miles, MD;  Location: [facility] HEART OR;  Service: Cardiovascular;  Laterality:  N/A;  probe [phone]       Family History:     Family History   Problem Relation Age of Onset   . Coronary artery disease Mother      Died at 4 from MI.   Marland Kitchen Coronary artery disease Father      Died at 12 from MI   . Diabetes type II       Mother and father       Social History:     History     Social History   . Marital Status: Widowed     Spouse Name: N/A     Number of Children: N/A   . Years of Education: N/A     Social History Main Topics   . Smoking status: Former Smoker -- 1.00 packs/day for 20 years     Quit date: 02/29/2012   . Smokeless tobacco: Not on file   . Alcohol Use: Not on file   . Drug Use: Not on file   . Sexual Activity: Not on file  Other Topics Concern   . Not on file     Social History Narrative       Allergies:     Allergies   Allergen Reactions   . Penicillins Anaphylaxis and Angioedema     Tolerates cephalosporins        Medications:     Prescriptions prior to admission   Medication Sig   . acetaminophen (TYLENOL) 325 MG tablet Take 2 tablets (650 mg total) by mouth every 4 (four) hours as needed for Pain.   Marland Kitchen amiodarone (PACERONE) 200 MG tablet Take 1 tablet (200 mg total) by mouth daily.   Marland Kitchen amLODIPine (NORVASC) 10 MG tablet Take 1 tablet (10 mg total) by mouth daily.   Marland Kitchen aspirin 325 MG tablet Take 1 tablet (325 mg total) by mouth daily. (Patient taking differently: Take 81 mg by mouth daily.   )   . atorvastatin (LIPITOR) 10 MG tablet Take 1 tablet (10 mg total) by mouth nightly.   . calcitRIOL (ROCALTROL) 0.25 MCG capsule Take 1 capsule (0.25 mcg total) by mouth daily.   . carvedilol (COREG) 25 MG tablet Take 1 tablet (25 mg total) by mouth every 12 (twelve) hours.   . collagenase (SANTYL) ointment Apply topically daily. The affected wound area.   . furosemide (LASIX) 20 MG tablet Take 1 tablet (20 mg total) by mouth daily.   Marland Kitchen gabapentin (NEURONTIN) 300 MG capsule Take 1 capsule (300 mg total) by mouth 2 (two) times daily.   . hydrALAZINE (APRESOLINE) 100 MG tablet Take 1 tablet (100  mg total) by mouth 3 (three) times daily.   . insulin aspart (NOVOLOG) 100 UNIT/ML injection Inject 1-4 Units into the skin 3 (three) times daily before meals as needed for High Blood Sugar. #90 insulin syringes and insulin needles   . oxyCODONE-acetaminophen (PERCOCET) 5-325 MG per tablet Take 1 tablet by mouth every 4 (four) hours as needed.   . venlafaxine (EFFEXOR) 75 MG tablet Take 1 tablet (75 mg total) by mouth 2 (two) times daily.       Review of Systems:    A comprehensive review of systems was: General ROS: negative for - chills, fever or night sweats  Psychological ROS: negative for - anxiety, depression, disorientation, hallucinations or suicidal ideation  ENT ROS: negative for - headaches, nasal congestion, sinus pain or visual changes  Allergy and Immunology ROS: negative for - itchy/watery eyes, nasal congestion or postnasal drip  Hematological and Lymphatic ROS: negative for - bleeding problems, bruising, pallor or weight loss  Endocrine ROS: negative for - malaise/lethargy or polydipsia/polyuria  Respiratory ROS: negative for - cough, orthopnea, shortness of breath or wheezing  Cardiovascular ROS: negative for - chest pain, dyspnea on exertion, orthopnea or shortness of breath  Gastrointestinal ROS: negative for - abdominal pain, constipation, diarrhea or nausea/vomiting  Genito-Urinary ROS: negative for - change in urinary stream, dysuria, hematuria or nocturia  Musculoskeletal ROS: negative for - joint pain, joint stiffness or joint swelling  Neurological ROS: lethargic  Dermatological ROS: negative for pruritus, rash and skin lesion changes      Physical Exam:     Filed Vitals:    04/28/14 1536   BP:    Pulse:    Temp:    Resp:    SpO2: 92%       Intake and Output Summary (Last 24 hours) at Date Time  No intake or output data in the 24 hours ending 04/28/14 1645     General appearance -lethargic  Mental status - lethargic  Eyes - pupils equal and reactive, extraocular eye movements intact  Ears -  bilateral TM's and external ear canals normal  Nose - normal and patent, no erythema, discharge or polyps  Mouth - mucous membranes moist, pharynx normal without lesions  Neck - supple, no significant adenopathy  Lymphatics - no palpable lymphadenopathy, no hepatosplenomegaly  Chest - clear to auscultation, no wheezes, rales or rhonchi, symmetric air entry  Heart - normal rate, regular rhythm, normal S1, S2, no murmurs, rubs, clicks or gallops  Abdomen - soft, nontender, nondistended, no masses or organomegaly  Neurological -lethargic  Musculoskeletal - no joint tenderness, deformity or swelling  Extremities - peripheral pulses normal, no pedal edema, no clubbing or cyanosis  Skin - normal coloration and turgor, no rashes, no suspicious skin lesions noted    Labs:       Recent Labs  Lab 04/28/14  0918 04/27/14  0934 04/26/14  0430  04/22/14  1409   SODIUM 135* 135* 136 More results in Results Review 135*   POTASSIUM 5.3* 5.6* 5.3* More results in Results Review 5.2*   CHLORIDE 107 107 109 More results in Results Review 108   CO2 16* 16* 16* More results in Results Review 18*   BUN 103* 96* 83* More results in Results Review 91*   CREATININE 4.8* 4.0* 3.0* More results in Results Review 3.5*   CALCIUM 8.8 8.9 8.7 More results in Results Review 8.5   ALBUMIN  --   --   --   --  2.9*   PROTEIN, TOTAL  --   --   --   --  6.6   BILIRUBIN, TOTAL  --   --   --   --  0.3   ALKALINE PHOSPHATASE  --   --   --   --  170*   ALT  --   --   --   --  88*   AST (SGOT)  --   --   --   --  35*   GLUCOSE 213* 196* 182* More results in Results Review 228*   More results in Results Review = values in this interval not displayed.    Recent Labs  Lab 04/25/14  1816   WBC 6.27   HEMOGLOBIN 9.6*   HEMATOCRIT 30.8*   PLATELETS 208           Rads:   Radiological Procedure reviewed.  Radiology Results (24 Hour)     ** No results found for the last 24 hours. **          Assessment:   Bradycardia  Acute on chronic renal failure  Altered mental  status  Encephalopathy   Azotemia  Hx aortic stenosis      Plan:   Admit to ICU    Renal consulted  May need hemodialysis    Encephalopathy due to azotemia    Bradycardia   Cardiology consulted for PPM    Hypertension       HLD      Signed by: Laveda Norman, MD

## 2014-04-28 NOTE — Plan of Care (Signed)
Severe Sepsis Screen  Date: 04/28/2014 Time: 3:54 PM  Nurse Signature: Trinette Vera M Caylor Tallarico    A. Infection:    Does your patient have ONE or more of the following infection criteria?     []  Documented Infection - Does the patient have positive culture results (from blood,        sputum, urine, etc)?   []  Anti-Infective Therapy - Is the patient receiving antibiotic, antifungal, or other                anti-infective therapy?   []  Pneumonia - Is there documentation of pneumonia (X Ray, etc)?   []  WBC's - Have WBC's been found in normally sterile fluid (urine, CSF, etc.)?   []  Perforated Viscus -Does the patient have a perforated hollow organ (bowel)?    A.  Did you check any of the boxes above?    [x]  No  If No, Stop Here and Sepsis Screen Negative.               []  Yes, continue to section B      B. SIRS:     Does your patient have TWO or more of the following SIRS criteria (ensure vital signs & temperature are within 1 hour of this screening)?    []  Temperature - Is the patient's temperature: Temp: (!) 95.4 F (35.2 C) (04/28/14 1500)   - Greater than or equal to 38.3 degrees C (greater than 100.9 degrees F)?   - Less than or equal to 36 degrees C (less than or equal to 96.8 degrees F)?    []  Heart Rate: Heart Rate: (!) 52 (04/28/14 1500)   - Is the patient's heart rate greater than or equal to 90 bpm?    []  Respiratory: Resp Rate: 18 (04/28/14 1500)   - Is the patient's respiratory rate greater than or equal to 20?    []  WBC Count - Is the patient's WBC count:   Recent Labs  Lab 04/25/14  1816   WBC 6.27      - Greater than or equal to 12,000/mm3 OR   - Less than or equal to 4,000/mm3 OR    - Are there greater than 10% immature neutrophils (bands)?    []  Glucose >140 without diabetes?   Recent Labs  Lab 04/28/14  0918   GLUCOSE 213*       []  Significant edema?    B.  Did you check two or more of the boxes above?     []  No, Stop Here and Sepsis Screen Negative   []  Yes, continue to section C    C.  Acute Organ  Dysfunction     Does your patient have ONE or more of the following organ dysfunction? (May need to wait for lab results for assessment - see below) Organ dysfunction must be a result of the sepsis not chronic conditions.    []  Cardiovascular - Does the patient have a: BP: (!) 185/98 mmHg (04/28/14 1500)   -systolic Blood pressure less than or equal to 90 mmHg OR   -systolic blood pressure has dropped 40 mmHg or more from baseline OR   -mean arterial pressure less than or equal to 70 mmHg (for at least one hour   despite fluid resuscitation OR require vasopressor support?  []  Respiratory - Does the patient have new hypoxia defined by any of the following"   -A sustained increase in oxygen requirements by at least 2L/min on NC or 28%  FiO2 within the last 24 hrs OR   -A persistent decrease in oxygen saturation of greater than or equal to 5% lasting   at least four or more hours and occurring within the last 24 hours  []  Renal - Does the patient have:   -low urine output (e.g. Less than 0.69mL/kg/HR for one hour despite adequate fluid    resuscitation, OR   -Increased creatinine (greater than 50% increase from baseline) OR   -require acute dialysis?  []  Hematologic - Does the patient have a:   -Low platelet count (less than 100,000 mm3)   Recent Labs  Lab 04/25/14  1816   PLATELETS 208   OR   -INR/aPTT greater than upper limit of normal?  Recent Labs  Lab 04/22/14  1409   PT INR 1.3*    or                    []  Metabolic - Does the patient have a high lactate (plasma lactate greater than or equal to 2.4 mMol/L)?       []  Hepatic - Are the patient's liver enzymes elevated (ALT greater than 72 IU/L or Total      Bilirubin greater than 2 MG/dL)?    Recent Labs  Lab 04/22/14  1409   BILIRUBIN, TOTAL 0.3   ALT 88*     []  CNS - Does the patient have altered consciousness or reduced Glasgow Coma      Scale?    C.  Did you check any of the boxes above?     []  No, Sepsis Screen Negative   []  YES:  A) Infection + B) SIRS + C)  Organ Dysfunction = Positive Screen for Severe Sepsis     Notify MD and document in Complex Assessment under provider notification   - Name of physician notified:                                         - Date/Time Notifiied:                                       Document actions: Following must be completed within 1 hour of positive sepsis screen   []  Lactate drawn (if initial lactate > , repeat lactate in 2 hours for goal decrease 10-20%)   []  Blood Cultures obtained (prior to antibiotic administration; if not done within the last 48 hours)   []  Antibiotics initiated or modified    []   IV Fluid administered 0.9% NS _______ mLs given (Initial Bolus of 30 ml/kg if SBP < 90 or MAP < 65 or lactate greater than 4 mmol/dl)  Nursing Comments?:     _________________________________________________________________    Patients meeting the following criteria are excluded from screening (check if applicable):   []  [place] hypothermia protocol   []  Comfort Care orders   []  Surgery- No screening for 24 hours after surgery (48 hours after CV surgery)       -If Yes. Date of surgery:______________________   []  Admitted with sepsis and until 72 hours after admission with documented sepsis (RESUME SEPSIS SCREEN AFTER 72 hour window!!!)      -If Yes, Date of Documented Sepsis:______________________   []  Positive screen AND Completed sepsis bundle during previous 24 hours       -  If Yes, Date/Time of positive severe sepsis screen:_______________________

## 2014-04-28 NOTE — Consults (Signed)
Service Date: 04/28/2014     Patient Type: I     CONSULTING PHYSICIAN: Zachery Dauer MD     REFERRING PHYSICIAN: Mechele Dawley MD     CONSULTING SERVICE;  Infectious Disease     REASON FOR CONSULTATION:  Sepsis.     HISTORY OF PRESENT ILLNESS:  This is a 64 year old black female who presents to the hospital with chief  complaint of bradycardia.  She has a history of coronary artery disease and  coronary artery bypass surgery.  She presented to the emergency room with  dizziness and lethargy with a heart rate in the 30s.  An external pacemaker  was placed.  She had been taking Coreg.  She initially was hypotensive.  I  was called for a potential sepsis.     PAST MEDICAL HISTORY:  1.  Myocardial infarction with bypass surgery.  2.  Diabetes with peripheral vascular disease including right femoral  artery stenosis tibial trunk stenosis with left CVL stenosis treated with  angioplasty.  3.  Underlying kidney disease.  4.  Hypertension.  5.  Hyperlipidemia.  6.  Chronic right leg ulcer initially treated at [facility] and at the  wound clinic.  7.  Chronic obstructive pulmonary disease.  8.  Tubal ligation.     FAMILY HISTORY:  Positive for diabetes, positive for coronary disease.     SOCIAL HISTORY:  She stopped smoking in 2013, no history of alcohol use.     MEDICATIONS:  Coreg, aspirin, Lipitor, Rocaltrol, Colace, Advair Diskus, hydralazine and  Effexor.       ALLERGIES:  PENICILLIN; however, is noted in the chart, the patient has tolerated  cephalosporins in the past.     REVIEW OF SYSTEMS:  GENERAL:  No fever or chills.  CARDIAC:  As above.  RESPIRATORY:  No cough, shortness of breath, chest pain, or wheezing.  GASTROINTESTINAL:  No nausea, vomiting, or diarrhea.  GENITOURINARY:  Decreased urine output.  MUSCULOSKELETAL:  No arthralgias or myalgias.  NEUROLOGIC:  Dizziness, lightheadedness but no syncope.  No seizures.  SKIN:  No rash or pruritus.  ENDOCRINE:  Diabetes and multiple complications as  above.  HEMATOLOGY:  No bruising, bleeding, or clotting.  PSYCHIATRIC:  No depression or anxiety.     PHYSICAL EXAMINATION:  GENERAL:  She is lethargic but does respond and follow commands.  VITAL SIGNS:  Show her to be afebrile, blood pressure 107/62.  HEAD AND NECK:  No jaundice.  LUNGS:  Show no rales, rhonchi, or wheezes.  HEART:  Bradycardia paced rhythm and paced beats on telemetry.  ABDOMEN:  Distended, weak bowel sounds, tender to palpation and percussion  in the lower quadrants.  EXTREMITIES:  Good pulses are felt chronic right lower extremity ulcer  without cellulitis, warmth, or drainage.  NEUROLOGIC:  Grossly intact with good motor strength.     LABORATORY DATA:  BUN is 103, creatinine 4.8, CO2 16, Lactic acid 1.3.  Review of her labs  from admission at [facility] in December of 2015 showed a BUN of 32,  creatinine 1.9.  Her bilirubin is 0.3, alkaline phosphatase 170, ALT 8, AST  35.  Urinalysis shows 26 to 50 white cells, esterase large.     ASSESSMENT:  1.  She is not clinically septic.  There is no hypertension, no  leukocytosis, and no fever.  2.  Abnormal urinalysis.  3.  PENICILLIN allergy.  4.  Acute renal failure requiring dialysis.     RECOMMENDATIONS:  1.  Based on the fact that she has been colonized with multidrug resistant  Proteus we will start ertapenem for her urinary tract infection.  2.  We will check cultures and adjust therapy as needed.           D:  04/28/2014 20:16 PM by Dr. Florian Buff. Madison Constable, MD [phone])  T:  04/28/2014 21:28 PM by       Everlean Cherry: [id number]) (Doc ID: [id number])

## 2014-04-28 NOTE — RRT Follow Up Note (Signed)
[name] is a  64 y.o.  female admitted 04/28/2014 with .  The Rapid Response Team was activated on 04/28/14 at 1640 for heart rate less than 50 beats/minute.  Currently the patients mental status is lethargic.  Vital signs are:   Temperature 94.9 F (34.9 C) (Oral), heart rate  48, blood pressure 174/74 , respirations 20, pulse ox 96.       Responded to RRT for symptomatic bradycardia. Dr. Verlin Grills in attendance. Pt very lethargic, arouses with difficulty. BP 157-185/73-88. Dr. Glean Hess in to examine pt, spoke with Dr. Eston Mould who agreed to admit pt to [facility]. Transported to room 2919 on monitor accompanied by RN.

## 2014-04-28 NOTE — Progress Notes (Signed)
Pt RRT for HR in the 40's, lethargic. Pt was a recent transfer from [place] for pacemaker placement.   Pt has a large diabetic ulcer on left lower leg, wound care down.   Pt's temp is 94.3, bair hugger.   Pt is able to state her name, place and was asking if her daughter is aware of situation. Transferring nurse said she has updated her daughter.    Will continue to monitor

## 2014-04-28 NOTE — Brief Op Note (Signed)
[facility]  CVIR   Brief Op Note     Physician(s): Kathrin Penner, MD    Pre-operative Diagnosis: ESRD    Post-operative Diagnosis: Diagnosis is same as preop diagnosis    Procedure(s) Performed: Temporary dialysis catheter placement                                                                                                                                                                                                                 Anesthesia:  1% lidocaine local    Complications: None    Estimated Blood Loss:  Minimal    Blood Aministered:  None    Fluid Aministered:  Per Nursing    Tubes and Drains: 20 cm Trialysis right CFV    Specimens: None    Findings: Patent right CFV vein. 20 cm temporary dialysis catheter placed and ready for use.    Patient was transferred from the procedure room to the nursing unit in stable condition.  Procedure note dictated.    Signed by: Kathrin Penner, MD  CVIR Department  [facility] [phone]  [facility] [phone]

## 2014-04-28 NOTE — Progress Notes (Addendum)
PROGRESS NOTE    Hospital Day: 6    Assessment:     Acute kidney injury.  Likely secondary to acute tubular necrosis.  Chronic kidney disease stage IV, baseline  Hyperkalemia.  Mild  Congestive heart failure.  Compensated.  Junctional rhythm with bradycardia  Hypertension chronic kidney disease.  Blood pressure not controlled  Shortness of breath.  Persisting but stable  Iron deficiency anemia.  On iron supplement.  Metabolic acidosis on supplement    Plan:     Discussed in detail with cardiology service.  Agree with pacemaker placement.  Patient kidney function has been worsening significantly and now she is oliguric.  It is very likely that in the next 24 hours if her kidney function worsen, then she will be needing hemodialysis. I had a detailed discussion with the patient along with the RN and told her about the possibility of hemodialysis in the next 24 hours.  Told her about the risk and benefit.  She agrees with hemodialysis if needed.  Check urinalysis and urine electrolytes for any granular casts, or high urine sodium suggesting ATN   Continue current blood pressure medications.  Continue IV iron  Continue sodium bicarbonate  Kayexalate as needed.  In case patient is being transferred to [facility], Dr. Leighton Roach will  take care of her      Subjective:     Since seen today in her room.  She looks sick.  She is mildly short of breath.  She remained bradycardiac and hypotensive    Review of Systems:   General:  She is not feeling well and continues to feel sick and a bit short of breath  HEENT: no neck pain, no throat pain  Endocrine: no fatigue   Respiratory: no cough, shortness of breath, or wheezing   Cardiovascular: no chest pain   Gastrointestinal: no abdominal pain,no N/V/D  Musculoskeletal: no edema  Neurological: no focal weakness  Dermatological: no rash, no ulcer    Physical Exam:     Filed Vitals:    04/28/14 1200   BP:    Pulse: 53   Temp: 97.7 F (36.5 C)   Resp: 22   SpO2: 94%       Intake  and Output Summary (Last 24 hours) at Date Time    Intake/Output Summary (Last 24 hours) at 04/28/14 1230  Last data filed at 04/28/14 1000   Gross per 24 hour   Intake    325 ml   Output    150 ml   Net    175 ml         General: She is short of breath and looks sick and mildly distressed  HEENT: No pallor.  Neck: No jugular venous distension.  Chest: Clear to auscultation.   CVS: Regular rate and rhythm. Normal S1S2. No murmur or rub.  Abdomen: Nontender, non distended. Positive bowel sounds.  No lower extremity edema.  No rash on limbs.     Scheduled Meds: Continuous Infusions:      aspirin EC 325 mg Oral Daily   atorvastatin 10 mg Oral QHS   calcitRIOL 0.25 mcg Oral Daily   docusate sodium 100 mg Oral Daily   fluticasone-salmeterol 1 puff Inhalation BID   gabapentin 300 mg Oral BID   heparin (porcine) 5,000 Units Subcutaneous Q12H SCH   hydrALAZINE 100 mg Oral Q8H SCH   NIFEdipine ER 90 mg Oral Daily   sodium bicarbonate 650 mg Oral BID   venlafaxine 75 mg Oral BID  Labs:       Recent Labs  Lab 04/25/14  1816 04/22/14  1409   WBC 6.27 4.94   HEMOGLOBIN 9.6* 9.7*   HEMATOCRIT 30.8* 30.5*   PLATELETS 208 222       Recent Labs  Lab 04/28/14  0918 04/27/14  0934 04/26/14  0430 04/25/14  0414  04/22/14  1409   SODIUM 135* 135* 136 135* More results in Results Review 135*   POTASSIUM 5.3* 5.6* 5.3* 4.9 More results in Results Review 5.2*   CHLORIDE 107 107 109 109 More results in Results Review 108   CO2 16* 16* 16* 18* More results in Results Review 18*   BUN 103* 96* 83* 89* More results in Results Review 91*   CREATININE 4.8* 4.0* 3.0* 3.4* More results in Results Review 3.5*   EGFR 11.0 13.6 18.9 16.4 More results in Results Review 15.9   GLUCOSE 213* 196* 182* 123* More results in Results Review 228*   CALCIUM 8.8 8.9 8.7 8.6 More results in Results Review 8.5   ALBUMIN  --   --   --   --   --  2.9*   PHOSPHORUS  --   --   --  4.9*  --   --    More results in Results Review = values in this interval  not displayed.      URINE TYPE   Date Value Ref Range Status   04/25/2014 Clean Catch  Final     COLOR, UA   Date Value Ref Range Status   04/25/2014 Yellow Clear - Yellow Final     CLARITY, UA   Date Value Ref Range Status   04/25/2014 Sl Cloudy* Clear - Hazy Final     SPECIFIC GRAVITY UA   Date Value Ref Range Status   04/25/2014 1.012 1.001-1.035 Final     URINE PH   Date Value Ref Range Status   04/25/2014 7.0 5.0-8.0 Final     NITRITE, UA   Date Value Ref Range Status   04/25/2014 Negative Negative Final     KETONES UA   Date Value Ref Range Status   04/25/2014 Negative Negative Final     UROBILINOGEN, UA   Date Value Ref Range Status   04/25/2014 Negative 0.2  -  2.0 mg/dL Final     BILIRUBIN, UA   Date Value Ref Range Status   04/25/2014 Negative Negative Final     BLOOD, UA   Date Value Ref Range Status   04/25/2014 Negative Negative Final     RBC, UA   Date Value Ref Range Status   04/25/2014 0 - 5 0 - 5 /hpf Final     WBC, UA   Date Value Ref Range Status   04/25/2014 26 - 50* 0 - 5 /hpf Final       Rads:     Radiology Results (24 Hour)     Procedure Component Value Units Date/Time    XR Chest AP Portable [[id number]] Collected:  04/28/14 0943    Order Status:  Completed Updated:  04/28/14 0948    Narrative:      INDICATION: Shortness of breath  COMPARISON: 04/27/2014    FINDINGS:  A single radiograph of the chest performed. Portable film.  The heart is enlarged in size. Median sternotomy The mediastinal and  hilar structures are within normal limits.  The lung fields demonstrates right lower lung atelectasis or infiltrate  and right effusion which is stable.Marland Kitchen  No  pneumothorax.    The  visualized osseous structures demonstrates no acute abnormality.      Impression:       Stable right effusion and adjacent edema.    Kinnie Feil, MD   04/28/2014 9:44 AM          Radiological Procedure reviewed.          Cristal Ford, MD  04/28/2014

## 2014-04-29 DIAGNOSIS — R69 Illness, unspecified: Secondary | ICD-10-CM

## 2014-04-29 DIAGNOSIS — E119 Type 2 diabetes mellitus without complications: Secondary | ICD-10-CM

## 2014-04-29 LAB — CBC
Hematocrit: 26.4 % — ABNORMAL LOW (ref 37.0–47.0)
Hgb: 8.6 g/dL — ABNORMAL LOW (ref 12.0–16.0)
MCH: 27.5 pg — ABNORMAL LOW (ref 28.0–32.0)
MCHC: 32.6 g/dL (ref 32.0–36.0)
MCV: 84.3 fL (ref 80.0–100.0)
MPV: 10.8 fL (ref 9.4–12.3)
Nucleated RBC: 2 /100 WBC — ABNORMAL HIGH (ref 0–1)
Platelets: 204 10*3/uL (ref 140–400)
RBC: 3.13 10*6/uL — ABNORMAL LOW (ref 4.20–5.40)
RDW: 19 % — ABNORMAL HIGH (ref 12–15)
WBC: 7.89 10*3/uL (ref 3.50–10.80)

## 2014-04-29 LAB — BASIC METABOLIC PANEL
Anion Gap: 16 — ABNORMAL HIGH (ref 5.0–15.0)
BUN: 81 mg/dL — ABNORMAL HIGH (ref 7.0–19.0)
CO2: 18 mEq/L — ABNORMAL LOW (ref 22–29)
Calcium: 8.5 mg/dL (ref 8.5–10.5)
Chloride: 104 mEq/L (ref 100–111)
Creatinine: 4 mg/dL — ABNORMAL HIGH (ref 0.6–1.0)
Glucose: 118 mg/dL — ABNORMAL HIGH (ref 70–100)
Potassium: 4.3 mEq/L (ref 3.5–5.1)
Sodium: 138 mEq/L (ref 136–145)

## 2014-04-29 LAB — GLUCOSE WHOLE BLOOD - POCT
Whole Blood Glucose POCT: 118 mg/dL — ABNORMAL HIGH (ref 70–100)
Whole Blood Glucose POCT: 124 mg/dL — ABNORMAL HIGH (ref 70–100)
Whole Blood Glucose POCT: 211 mg/dL — ABNORMAL HIGH (ref 70–100)
Whole Blood Glucose POCT: 74 mg/dL (ref 70–100)
Whole Blood Glucose POCT: 79 mg/dL (ref 70–100)

## 2014-04-29 LAB — PHOSPHORUS: Phosphorus: 4.5 mg/dL (ref 2.3–4.7)

## 2014-04-29 LAB — HEMOLYSIS INDEX: Hemolysis Index: 11 (ref 0–18)

## 2014-04-29 LAB — AMMONIA: Ammonia: 42 umol/L (ref 18–72)

## 2014-04-29 LAB — GFR: EGFR: 13.6

## 2014-04-29 LAB — MAGNESIUM: Magnesium: 2.3 mg/dL (ref 1.6–2.6)

## 2014-04-29 MED ORDER — FENTANYL CITRATE 0.05 MG/ML IJ SOLN
12.5000 ug | INTRAMUSCULAR | Status: DC | PRN
Start: 2014-04-29 — End: 2014-05-16
  Administered 2014-04-30: 12.5 ug via INTRAVENOUS
  Filled 2014-04-29: qty 2

## 2014-04-29 MED ORDER — SODIUM CHLORIDE 0.9 % IV BOLUS
250.0000 mL | INTRAVENOUS | Status: AC | PRN
Start: 2014-04-29 — End: 2014-04-29

## 2014-04-29 MED ORDER — ASPIRIN 81 MG PO TBEC
81.0000 mg | DELAYED_RELEASE_TABLET | Freq: Every day | ORAL | Status: DC
Start: 2014-04-30 — End: 2014-05-06
  Administered 2014-04-30 – 2014-05-06 (×6): 81 mg via ORAL
  Filled 2014-04-29 (×6): qty 1

## 2014-04-29 MED ORDER — SODIUM CHLORIDE 0.9 % IV SOLN
500.0000 mg | INTRAVENOUS | Status: AC
Start: 2014-04-29 — End: 2014-05-02
  Administered 2014-04-30 – 2014-05-02 (×4): 500 mg via INTRAVENOUS
  Filled 2014-04-29 (×5): qty 500

## 2014-04-29 MED ORDER — SODIUM CHLORIDE 0.9 % IV BOLUS
100.0000 mL | INTRAVENOUS | Status: AC | PRN
Start: 2014-04-29 — End: 2014-04-29

## 2014-04-29 NOTE — Progress Notes (Signed)
CM spoke with [place], [place] HD Liaison to report that Pt likely to require outpatient HD upon d/c.   Gaye Alken, MSW    [phone]

## 2014-04-29 NOTE — Treatment Plan (Signed)
Severe Sepsis Screen  Date: 04/29/2014 Time: 9:53 PM  Nurse Signature: Misty Stanley Yardley Lekas    A. Infection:    Does your patient have ONE or more of the following infection criteria?     []  Documented Infection - Does the patient have positive culture results (from blood,        sputum, urine, etc)?   [x]  Anti-Infective Therapy - Is the patient receiving antibiotic, antifungal, or other                anti-infective therapy?   []  Pneumonia - Is there documentation of pneumonia (X Ray, etc)?   []  WBC's - Have WBC's been found in normally sterile fluid (urine, CSF, etc.)?   []  Perforated Viscus -Does the patient have a perforated hollow organ (bowel)?    A.  Did you check any of the boxes above?    []  No  If No, Stop Here and Sepsis Screen Negative.               [x]  Yes, continue to section B      B. SIRS:     Does your patient have TWO or more of the following SIRS criteria (ensure vital signs & temperature are within 1 hour of this screening)?    []  Temperature - Is the patient's temperature: Temp: 98.2 F (36.8 C) (04/29/14 2100)   - Greater than or equal to 38.3 degrees C (greater than 100.9 degrees F)?   - Less than or equal to 36 degrees C (less than or equal to 96.8 degrees F)?    []  Heart Rate: Heart Rate: (!) 58 (04/29/14 2130)   - Is the patient's heart rate greater than or equal to 90 bpm?    []  Respiratory: Resp Rate: 19 (04/29/14 2130)   - Is the patient's respiratory rate greater than or equal to 20?    []  WBC Count - Is the patient's WBC count:   Recent Labs  Lab 04/29/14  0354   WBC 7.89      - Greater than or equal to 12,000/mm3 OR   - Less than or equal to 4,000/mm3 OR    - Are there greater than 10% immature neutrophils (bands)?    []  Glucose >140 without diabetes?   Recent Labs  Lab 04/29/14  0354   GLUCOSE 118*       []  Significant edema?    B.  Did you check two or more of the boxes above?     [x]  No, Stop Here and Sepsis Screen Negative   []  Yes, continue to section C    C.  Acute Organ Dysfunction      Does your patient have ONE or more of the following organ dysfunction? (May need to wait for lab results for assessment - see below) Organ dysfunction must be a result of the sepsis not chronic conditions.    []  Cardiovascular - Does the patient have a: BP: (!) 175/104 mmHg (04/29/14 2100)   -systolic Blood pressure less than or equal to 90 mmHg OR   -systolic blood pressure has dropped 40 mmHg or more from baseline OR   -mean arterial pressure less than or equal to 70 mmHg (for at least one hour   despite fluid resuscitation OR require vasopressor support?  []  Respiratory - Does the patient have new hypoxia defined by any of the following"   -A sustained increase in oxygen requirements by at least 2L/min on NC or 28%    FiO2  within the last 24 hrs OR   -A persistent decrease in oxygen saturation of greater than or equal to 5% lasting   at least four or more hours and occurring within the last 24 hours  []  Renal - Does the patient have:   -low urine output (e.g. Less than 0.51mL/kg/HR for one hour despite adequate fluid    resuscitation, OR   -Increased creatinine (greater than 50% increase from baseline) OR   -require acute dialysis?  []  Hematologic - Does the patient have a:   -Low platelet count (less than 100,000 mm3)   Recent Labs  Lab 04/29/14  0354   PLATELETS 204   OR   -INR/aPTT greater than upper limit of normal?   or                    []  Metabolic - Does the patient have a high lactate (plasma lactate greater than or equal to 2.4 mMol/L)?   Recent Labs  Lab 04/28/14  1647   LACTIC ACID 1.3       []  Hepatic - Are the patient's liver enzymes elevated (ALT greater than 72 IU/L or Total      Bilirubin greater than 2 MG/dL)?      []  CNS - Does the patient have altered consciousness or reduced Glasgow Coma      Scale?    C.  Did you check any of the boxes above?     []  No, Sepsis Screen Negative   []  YES:  A) Infection + B) SIRS + C) Organ Dysfunction = Positive Screen for Severe Sepsis     Notify MD and  document in Complex Assessment under provider notification   - Name of physician notified:                                         - Date/Time Notifiied:                                       Document actions: Following must be completed within 1 hour of positive sepsis screen   []  Lactate drawn (if initial lactate > , repeat lactate in 2 hours for goal decrease 10-20%)   []  Blood Cultures obtained (prior to antibiotic administration; if not done within the last 48 hours)   []  Antibiotics initiated or modified    []   IV Fluid administered 0.9% NS _______ mLs given (Initial Bolus of 30 ml/kg if SBP < 90 or MAP < 65 or lactate greater than 4 mmol/dl)  Nursing Comments?:     _________________________________________________________________    Patients meeting the following criteria are excluded from screening (check if applicable):   []  [place] hypothermia protocol   []  Comfort Care orders   []  Surgery- No screening for 24 hours after surgery (48 hours after CV surgery)       -If Yes. Date of surgery:______________________   []  Admitted with sepsis and until 72 hours after admission with documented sepsis (RESUME SEPSIS SCREEN AFTER 72 hour window!!!)      -If Yes, Date of Documented Sepsis:______________________   []  Positive screen AND Completed sepsis bundle during previous 24 hours       -If Yes, Date/Time of positive severe sepsis screen:_______________________

## 2014-04-29 NOTE — Progress Notes (Signed)
04/29/14 1336   Post Treatment Assessment   Post-Treatment Weight (Kg) 77.4   Vitals   Temp 98 F (36.7 C)   Heart Rate 92   Resp Rate 16   BP 177/74 mmHg   SpO2 98 %   Assessment   Mental Status Alert;Oriented   Cardiac (WDL) WDL   Cardiac Regularity Regular   Cardiac Rhythm Normal Sinus Rhythm   Respiratory  WDL   Respiratory Pattern Regular   Bilateral Breath Sounds Clear;Diminished   R Breath Sounds Diminished   L Breath Sounds Diminished   Cough No cough effort   Edema  WDL   RLE Edema +1   LLE Edema +1   General Skin Color Appropriate for ethnicity   Skin Condition/Temp Warm;Dry   Gastrointestinal (WDL) X   Abdomen Inspection Soft   GI Symptoms None   Mobility Bed   Education   Person taught Patient   Knowledge basis None   Topics taught Procedure   Teaching Tools Explain   Bedside Nurse Communication   Name of bedside RN - post dialysis Ernestina Columbia RN

## 2014-04-29 NOTE — Plan of Care (Signed)
HR has switched from a junctional 50s-60s this AM to sinus tach during dialysis treatment, EKG done stating SR.  However, since then HR has slowed back down to 50s-60s and has occasional p waves.  SBP remains on the high side 170s, however, has decreased since dialysis.  2L taken off in dialysis.  Pt remains on 2L NC with 02 sats in the upper 90s.  Remains lethargic, improving throughout the day and becoming more responsive.  Poor appetite, diet ordered today.  WOCN seeing patient, orders to follow.  No urine output, anuric since straight cath .

## 2014-04-29 NOTE — Consults (Signed)
Wound Ostomy Continence Consultation    Date Time: 04/29/2014 4:17 PM  Patient Name: [name], [name]  Consulting Service: WOCN  Reason for Consult: R leg ulcer (POA)    Assessment & Plan   Assessment   R lateral malleolus ulcer (POA) - old dressing removed.  Cleansed with NSS and patted with DSD.  Wound measures 10cm x 3.8cm x UTA, two open areas with scarred area between.  Mild odor, wound bed with 80% dry hard yellow slough and 20% pink tissue.  Applied non adherent gauze to wound and covered with strip of calcium alginate with silver.  Covered all with non adhesive foam and secured with conform stretch gauze with tape to outside of dressing.  Aseptic technique, pt tolerated well.    Plan:  Calcium alginate with silver for exudate management and antimicrobial benefit.  Wound care orders entered into EMR.  Care rendered.  WOC Nurse service to follow.    Objective Findings   Specialty Bed:   Total Care Sport  Wound Assessment:   Wound 04/22/14 Arterial Ankle Inner;Right White and yellow wound bed (Active)   Site Description Dry;Granulation tissue;Tan 04/29/2014 12:00 PM   Peri-wound Description Dry;Peeling/Flaking 04/29/2014 12:00 PM   Shape L shaped 04/24/2014  8:00 PM   Wound Length (cm) 10.5 cm 04/22/2014  8:45 PM   Wound Width (cm) 3.5 cm 04/22/2014  8:45 PM   Drainage Amount None 04/29/2014 12:00 PM   Drainage Description Sanguinous 04/26/2014  4:00 AM   Treatments Cleansed;Site care 04/28/2014  6:00 PM   Dressing Ace Wrap 04/29/2014 12:00 PM   Dressing Changed Changed 04/29/2014 12:00 PM   Dressing Status Clean;Dry 04/29/2014 12:00 PM     See above for detail  History of Presenting Illness   HPI:   This is a 64 y.o. female with with chief complaint of bradycardia. She has a history of coronary artery disease and coronary artery bypass surgery. She presented to the emergency room with dizziness and lethargy with a heart rate in the 30s. An external pacemaker was placed. She had been taking Coreg. She initially was hypotensive. I  was called for a potential sepsis. (HPI as per documentation).    Past Medical and Surgical History     Past Medical History   Diagnosis Date   . Diabetes mellitus    . Chronic kidney disease    . Myocardial infarction    . Hypertension    . Hyperlipidemia    . Peripheral arterial disease      06/28/13 right superficial femoral artery stenosis, tibial peroneal trunk stenosis, left CVL a stenosis, treated with angiography and angioplasty.   . Coronary artery disease      Left circumflex artery presumed DES.,  Chronically occluded right coronary artery stent   . Anemia      She redid to chronic disease and iron deficiency.     . Arterial leg ulcer      2014, receiving home health   . Chronic obstructive pulmonary disease      Previously on Spiriva and Advair       Past Surgical History   Procedure Laterality Date   . Cardiac angiography and angioplasty  06/2013   . Colonoscopy  Unknown   . Tubal ligation     . Coronary artery bypass N/A 03/04/2014     Procedure: CORONARY ARTERY BYPASS;  Surgeon: Austin Miles, MD;  Location: [facility] HEART OR;  Service: Cardiovascular;  Laterality: N/A;  LIMA to LAD  SVG to  OM   . Endoscopic,vein harvest Left 03/04/2014     Procedure: ENDOSCOPIC,VEIN HARVEST;  Surgeon: Austin Miles, MD;  Location: [name] HEART OR;  Service: Cardiovascular;  Laterality: Left;  By D. Lazarus Gowda, RNFA. Left groin to mid-calf.   Rhae Hammock N/A 03/04/2014     Procedure: TEE;  Surgeon: Austin Miles, MD;  Location: [facility] HEART OR;  Service: Cardiovascular;  Laterality: N/A;  probe [phone]       Family History     Family History   Problem Relation Age of Onset   . Coronary artery disease Mother      Died at 3 from MI.   Marland Kitchen Coronary artery disease Father      Died at 74 from MI   . Diabetes type II       Mother and father       Lab   Significant Lab Values:    Recent Labs      04/29/14   0354   WBC  7.89   RBC  3.13*   HEMOGLOBIN  8.6*   HEMATOCRIT  26.4*   SODIUM  138   POTASSIUM  4.3   CHLORIDE  104   CO2  18*    BUN  81.0*   CREATININE  4.0*   CALCIUM  8.5   EGFR  13.6   GLUCOSE  118*       Culture   ORDER#: [id number] ORDERED BY: Shirleen Schirmer  SOURCE: Ulcer right ankle COLLECTED: 04/22/14 21:34  ANTIBIOTICS AT COLL.: RECEIVED : 04/23/14 01:53  Stain, Gram FINAL 04/23/14 02:39  04/23/14 No WBCs seen  No Squamous epithelial cells seen  Many Gram positive cocci  Many Gram negative rods  Few Gram positive rods  Culture and Gram Stain, Aerobic, Wound FINAL 04/26/14 10:03 +  04/24/14 Heavy growth of mixed gram positive flora, unable to isolate for  identification due to swarming of Proteus  04/26/14 Heavy growth of MDR Proteus mirabilis    Ceftriaxone Resistant Enterobacteriaceae.  This multidrug resistant (MDR) organism may not respond  optimally to B-lactam antibiotics (excluding carbapenems      Cy Blamer, RN, BSN, CWOCN  [facility] Wound, Ostomy, Continence Nurse  Office: [phone]  [employer]: [phone]

## 2014-04-29 NOTE — Consults (Signed)
Service Date: 04/28/2014     Patient Type: I     CONSULTING PHYSICIAN: Margarite Gouge MD     REFERRING PHYSICIAN: Charlann Noss Silis MD     HISTORY OF PRESENT ILLNESS:  This is a 64 year old female with past medical history of coronary artery  disease with coronary artery bypass graft in December; right foot ulcer,  nonhealing; hypertension, hyperlipidemia, chronic kidney disease, aortic  stenosis, congestive heart failure, ejection fraction 45%, presented to the  hospital on April 22, 2014, with feeling fatigue and low heart rate 2  days prior to presentation.  Brought into the emergency room by her family.   She denied any chest pain, shortness of breath, but has some dizziness.   No nausea, no vomiting, no fevers.  She was seen in the emergency room,  where she was bradycardic with a rate in the 40s.  She was with a  junctional escape rhythm at 30s to 40 with a narrow QRS complex thought to  be related to high-dose beta blocker.  She was taken off that and  monitored.  She was admitted initially to telemetry, but subsequently had a  worsening overall condition with worsening renal function, worsening in her  mental status.  Decision was to transfer to intensive care unit for  evaluation.  She is not able to give much of a history.  She is very  drowsy, a poor historian.  All information obtained from the chart.  She  was seen by cardiology and there is no indication for an emergency  transvenous pacemaker or a permanent pacemaker at this time.  Again, she  was taken off beta blockers and was monitored.  She had a stable blood  pressure throughout the day with no chest pain and no shortness of breath,  but some intermittent abdominal discomfort, dizziness, and lightheadedness.     PAST MEDICAL HISTORY:  Extensive history with diabetes mellitus, chronic kidney disease, prior  history of myocardial infarction, hypertension, hyperlipidemia, peripheral  vascular disease, anemia, chronic leg ulcer, chronic obstructive  pulmonary  disease.     PAST SURGICAL HISTORY:  Angioplasty in April 2015, colonoscopy, tubal ligation, coronary artery  bypass graft in December 2015.  She had a TEE also done at that time.     ALLERGIES:  PCN  FAMILY HISTORY:  Noncontributory.     SOCIAL HISTORY:  Former smoker.  No alcohol, no drug use.     HOME MEDICATIONS:  She was on Coreg, Percocet, aspirin, Tylenol, Effexor, hydralazine,  Neurontin, Lasix, and Rocaltrol, along with Lipitor, amlodipine, amiodarone  and Santyl.     PHYSICAL EXAMINATION:  GENERAL:  Seen at bedside.  She is drowsy, arousable to stimuli, follows  simple commands, but drifts back to sleep.  VITAL SIGNS:  Blood pressure 160/70, heart rate was in the 40s to 50s.  Now  external pacer was ordered and she is in 70 to 80.  She is afebrile.  She  is actually hypothermic.  Temperature 95.4, saturation 96% on 4 liters  oxygen.  HEAD AND NECK:  Dry oral mucosa, no pallor, no jaundice.  Cranial nerves  appear to be intact.  No carotid bruit.  CARDIAC:  Bradycardic, junctional rhythm now with no paste.  I do not  appreciate any gallop or murmur.  LUNGS:  Clear to auscultation.  She had a chest x-ray showed stable right  effusion with mild edema.  She is on 4 liters, saturation 96%.  ABDOMEN:  Mildly distended, tympanic,  diffusely tender.  No guarding or  rebound.  Bowel sounds are diminished.  EXTREMITIES:  Lower extremity mild edema with right foot ulcer which is  wrapped with an Ace wrap.     LABORATORY AND DIAGNOSTIC DATA:  She had an echocardiogram which showed ejection fraction of 55%.  She has  mild aortic stenosis with mild to moderate left ventricular hypertrophy.   She has moderate pulmonary hypertension.  Otherwise, her WBC is 6,  hematocrit 31, platelet count 208.  BUN and creatinine are 103 and 4.8.   Sodium is 135, potassium 4.3, chloride 107, bicarbonate 16, glucose 213.   Arterial blood gas:  The pH 7.31, pCO2 is 35, pO2 is 85.  Urinalysis showed  large leukocyte esterase.  No  urine culture done at this time.     ASSESSMENT AND PLAN:  This is a 64 year old female with above medical history, now with:  1.  Severe bradycardia, could be related to medication versus a complete  heart block with sinus arrest.  She will be monitored.  She has been here  off beta blocker for a couple days, still in junctional rhythm.  She may  require a placement of a permanent pacemaker.  At this time she is on  external pacing, which may or may not require consistently.  We will set  the rate just to pace if the heart rate is less than 50 considering the  patient has a good blood pressure and overall unaffected by the low rate.   Cardiology has been involved in the patient's care.  She is off amiodarone  and beta blocker.  2.  Encephalopathy.  This is likely multifactorial.  Worsening azotemia is  one, bradycardia could be another, and cannot rule out underlying  infection.  She is hypothermic.  She has a finding suggestive of possible  urinary tract infection in her urinalysis which was done a couple days ago;  it showed large leukocyte esterase.  I will send a urine culture and start  treatment if positive.  She unfortunately is allergic to PENICILLIN with  angioedema.  We will avoid also cephalosporins. Quinolones may not be a  good option considering the prolongation of the QT.  We will hold off on  antibiotic at this time.  3.  Renal insufficiency, acute on chronic, worsening azotemia, hyperkalemia  and some degree of acidosis.  Her blood gas showed overall good  compensation with the pH 7.31.  I do not see an urgent emergent need for  hemodialysis, but she is already seen by nephrology and hemodialysis has  been planned and IR will place a hemodialysis catheter today.  4.  Diabetes mellitus, elevated blood glucose.  She will be placed on  Accu-Chek and cover with sliding scale.     She will be monitored in intensive care unit.  Thank you for the  consultation.           D:  04/28/2014 19:28 PM by Dr.  Jordan Likes A. Eston Mould, MD [phone])  T:  04/28/2014 21:08 PM by       Everlean Cherry: [id number]) (Doc ID: [id number])

## 2014-04-29 NOTE — Progress Notes (Signed)
RENAL PROGRESS NOTE    Date Time: 04/29/2014 3:23 PM  Patient Name: [name]  Attending Physician: Laveda Norman, MD    Assessment:   Acute kidney injury requiring dialysis.   Chronic kidney disease IV.  Metabolic acidosis, likely related to above.   Bradyarrhythmia, cardiology is following.  Hypertension, uncontrolled.  Anemia, of chronic disease and iron deficiency.  Hyperphosphatemia,   Sec hyperparathyroidism.   Plan:   S/p emergent dialysis yesterday.  Received dialysis today.   Continue to monitor. Will re-evaluate in am.  Monitor the renal function, electrolytes and fluid status.  Monitor for renal recovery.  Monitor the blood pressure and further adjust the meds. Dialysis should help.  Monitor phosphorus, should improve a bit  With dialysis. Already on calcitriol.  Discontinue Bicarb, Since pt started on dialysis. Monitor the level.  On Iron supplement. Monitor H&H.  Meds in renal dose and avoid nephrotoxic as may recover some renal function..  Strict I&O.  Subjective:   Pt is seen and examined at bedside. She is alert and awake but appears a bit sick    Review of Systems:   Pt seems a bit sick and sleepy.  Difficult ROS, however she did not offer any significant complaints.    Meds:     Current Facility-Administered Medications   Medication Dose Route Frequency   . [START ON 04/30/2014] aspirin EC  81 mg Oral Daily   . atorvastatin  10 mg Oral QHS   . calcitRIOL  0.25 mcg Oral Daily   . docusate sodium  100 mg Oral Daily   . ertapenem  500 mg Intravenous Q24H   . fluticasone-salmeterol  1 puff Inhalation BID   . heparin (porcine)  5,000 Units Subcutaneous Q12H [facility]   . hydrALAZINE  100 mg Oral Q8H SCH   . sodium bicarbonate  650 mg Oral BID   . venlafaxine  25 mg Oral BID       Physical Exam:   BP 177/74 mmHg  Pulse 92  Temp(Src) 98 F (36.7 C) (Oral)  Resp 16  Ht 1.549 m (5\' 1" )  Wt 78.291 kg (172 lb 9.6 oz)  BMI 32.63 kg/m2  SpO2 98%    Intake/Output Summary (Last 24 hours) at 04/29/14  1523  Last data filed at 04/29/14 1330   Gross per 24 hour   Intake    300 ml   Output   4700 ml   Net  -4400 ml       General: awake, alert, no acute distress.  Cardiovascular: S1S2,   Lungs: Air entry bilaterally  Abdomen: soft, non-tender, non-distended,   Extremities: no clubbing, cyanosis, present trace edema    Labs:     Recent Labs      04/29/14   0354   WBC  7.89   HEMOGLOBIN  8.6*   HEMATOCRIT  26.4*   PLATELETS  204   MCV  84.3     Recent Labs      04/29/14   0354  04/28/14   0918   SODIUM  138  135*   POTASSIUM  4.3  5.3*   CHLORIDE  104  107   CO2  18*  16*   BUN  81.0*  103*   CREATININE  4.0*  4.8*   GLUCOSE  118*  213*   CALCIUM  8.5  8.8   MAGNESIUM  2.3  2.9*   PHOSPHORUS  4.5   --      No results for  input(s): AST, ALT, ALKPHOS, PROT, ALB in the last 72 hours.  No results for input(s): PTT, PT, INR in the last 72 hours.        Recent Labs  Lab 04/26/14  0430   IRON 10*   TIBC 198*       Recent Labs  Lab 04/26/14  0430   FERRITIN 488.42*         Radiology Results (24 Hour)     Procedure Component Value Units Date/Time    Triple Lumen Dialysis Cath [[id number]] Resulted:  04/28/14 1911    Order Status:  Sent Updated:  04/28/14 2012          Mosie Lukes, MD  [phone]

## 2014-04-29 NOTE — Progress Notes (Signed)
INFECTIOUS DISEASES PROGRESS NOTE    Date Time: 04/29/2014 3:01 PM  Patient Name: [name], [name]  Patient status.Inpatient  Hospital Day: 1    Assessment and Plan:   1. ? UTI  -Cx pending  2. No fever or leucocytosis      PLAN: 1. Ertapenem pending results of urine culture  Subjective:   drowsy    Review of Systems:   Review of Systems - General ROS: negative    Antibiotics:   Day 2    Other medications reviewed in EPIC.  Central Access:   Fem line    Physical Exam:     Filed Vitals:    04/29/14 1336   BP: 177/74   Pulse: 92   Temp: 98 F (36.7 C)   Resp: 16   SpO2: 98%       Mental status - drowsy  Chest - clear to auscultation, no wheezes, rales or rhonchi, symmetric air entry  Heart - normal rate, regular rhythm, normal S1, S2, no murmurs, rubs, clicks or gallops  Abdomen - soft, nontender, nondistended, no masses or organomegaly    Labs:     Microbiology Results     Procedure Component Value Units Date/Time    MRSA culture [[id number]] Collected:  04/29/14 0034    Specimen Information:  Body Fluid / Nares and Throat Updated:  04/29/14 0516    MRSA culture [[id number]] Collected:  04/29/14 0034    Specimen Information:  Body Fluid / Nares and Throat Updated:  04/29/14 0516    Urine culture [[id number]] Collected:  04/29/14 0354    Specimen Information:  Urine / Urine, Catheterized, Foley Updated:  04/29/14 0808          CBC w/Diff CMP     Recent Labs  Lab 04/29/14  0354 04/25/14  1816   WBC 7.89 6.27   HEMOGLOBIN 8.6* 9.6*   HEMATOCRIT 26.4* 30.8*   PLATELETS 204 208   MCV 84.3 87.0   NEUTROPHILS  --  78       PT/INR           Recent Labs  Lab 04/29/14  0354 04/28/14  0918 04/27/14  0934 04/26/14  0430 04/25/14  0414   SODIUM 138 135* 135* 136 135*   POTASSIUM 4.3 5.3* 5.6* 5.3* 4.9   CHLORIDE 104 107 107 109 109   CO2 18* 16* 16* 16* 18*   BUN 81.0* 103* 96* 83* 89*   CREATININE 4.0* 4.8* 4.0* 3.0* 3.4*   GLUCOSE 118* 213* 196* 182* 123*   CALCIUM 8.5 8.8 8.9 8.7 8.6   MAGNESIUM 2.3 2.9*  --  2.7* 2.7*   PHOSPHORUS  4.5  --   --   --  4.9*      Glucose POCT     Recent Labs  Lab 04/29/14  0354 04/28/14  0918 04/27/14  0934 04/26/14  0430 04/25/14  0414 04/24/14  0332 04/23/14  1041   GLUCOSE 118* 213* 196* 182* 123* 133* 105*          Rads:     Radiology Results (24 Hour)     Procedure Component Value Units Date/Time    Triple Lumen Dialysis Cath [[id number]] Resulted:  04/28/14 1911    Order Status:  Sent Updated:  04/28/14 2012            Signed by: Zachery Dauer

## 2014-04-29 NOTE — Plan of Care (Signed)
Problem: Renal Failure  Goal: Free from infection- Renal  Outcome: Not Progressing  Pt.'s SBP has been b/w 120's and 170's , HR b/w 50's and 70's. Junctional rhythm, Transcutaneous patch on, Dr. Vania Rea ordered pacing when HR drops to 50 and pt is symptomatic.  Quenton cath with pigtail inserted on the right femoral artery, dialysis was done and 2ltrs of fluid was pulled out.  At 0400 via straight cath tea color  600cc of urine was pulled out. Urine culture sent to the lab. Pt. Remains NPO. Keep monitoring the pt.  Pt. Is more alert than when she first came in but still lethargic.

## 2014-04-29 NOTE — Progress Notes (Signed)
[facility]  Progress Note      Date Time: 04/29/2014 2:17 PM  Patient Name: [name], [name]     Assessment:     Junctional bradycardia  CAD s/p CABG  Chronic renal insufficiency  Isch CM  Mild Aortic stenosis    Plan:    Will get ECG today, continue to monitor for any bradycardia , Ht block . She can follow up with [place] from Cv standpoint      Signed by: Fortino Sic, MD  [facility])  [phone]  MD LINE [phone]    Patient Active Problem List   Diagnosis   . NSTEMI (non-ST elevated myocardial infarction)   . Hypertensive urgency   . PAD (peripheral artery disease)   . Arterial leg ulcer   . Chronic obstructive pulmonary disease, unspecified COPD type   . Ischemic cardiomyopathy   . CAD (coronary artery disease)   . Diabetes   . Chronic kidney disease   . Debilitated   . Complete heart block   . Junctional bradycardia   . Hyperkalemia   . Renal failure   . Bradycardia   . Acute on chronic renal failure   . Encephalopathy   . Azotemia       Subjective:   Denies chest pain, SOB or palpitations.  More alert today knows where she is, yr, date.    Medications:     Current Facility-Administered Medications   Medication Dose Route Frequency Provider Last Rate Last Dose   . acetaminophen (TYLENOL) tablet 650 mg  650 mg Oral Q4H PRN Silis, Charlann Noss, MD   650 mg at 04/29/14 0421    Or   . acetaminophen (TYLENOL) suppository 650 mg  650 mg Rectal Q4H PRN Silis, Charlann Noss, MD       . acetaminophen (TYLENOL) tablet 650 mg  650 mg Oral Q4H PRN Silis, Charlann Noss, MD   650 mg at 04/29/14 0420   . aspirin EC EC tablet 325 mg  325 mg Oral Daily Silis, Charlann Noss, MD   325 mg at 04/29/14 0901   . atorvastatin (LIPITOR) tablet 10 mg  10 mg Oral QHS Silis, Charlann Noss, MD   10 mg at 04/28/14 2355   . atropine injection 0.5 mg  0.5 mg Intravenous PRN Jones Skene, MD       . bisacodyl (DULCOLAX) suppository 10 mg  10 mg Rectal QD PRN Silis, Charlann Noss, MD       . calcitRIOL (ROCALTROL) capsule 0.25 mcg  0.25 mcg  Oral Daily Silis, Charlann Noss, MD   0.25 mcg at 04/29/14 0901   . dextrose 50 % bolus 25 mL  25 mL Intravenous PRN Silis, Charlann Noss, MD       . dextrose 50 % bolus 25 mL  25 mL Intravenous PRN Sherley Bounds, Subodhkumar, MD       . dextrose 50 % bolus 25 mL  25 mL Intravenous PRN Al Khouri, Samir A, MD       . docusate sodium (COLACE) capsule 100 mg  100 mg Oral Daily Silis, Charlann Noss, MD   100 mg at 04/29/14 0901   . ertapenem (INVanz) 500 mg in sodium chloride 0.9 % 100 mL IVPB  500 mg Intravenous Q24H Lianne Bushy D, MD       . fentaNYL (SUBLIMAZE) injection 12.5 mcg  12.5 mcg Intravenous Q4H PRN Jones Skene, MD       . fluticasone-salmeterol (ADVAIR DISKUS) 250-50 MCG/DOSE 1 puff  1  puff Inhalation BID Silis, Charlann Noss, MD   1 puff at 04/29/14 0818   . glucagon (rDNA) (GLUCAGEN) injection 1 mg  1 mg Intramuscular PRN Silis, Charlann Noss, MD       . glucagon (rDNA) (GLUCAGEN) injection 1 mg  1 mg Intramuscular PRN Jones Skene, MD       . glucagon (rDNA) (GLUCAGEN) injection 1 mg  1 mg Intramuscular PRN Al Khouri, Samir A, MD       . heparin (porcine) injection 5,000 Units  5,000 Units Subcutaneous Q12H [facility] Silis, Charlann Noss, MD   5,000 Units at 04/29/14 0901   . hydrALAZINE (APRESOLINE) tablet 100 mg  100 mg Oral Q8H [facility] Silis, Charlann Noss, MD   100 mg at 04/29/14 1329   . insulin aspart (NovoLOG) injection 1-3 Units  1-3 Units Subcutaneous QHS PRN Al Khouri, Samir A, MD       . insulin aspart (NovoLOG) injection 1-5 Units  1-5 Units Subcutaneous PRN Jones Skene, MD       . insulin aspart (NovoLOG) injection 1-5 Units  1-5 Units Subcutaneous TID AC PRN Al Khouri, Samir A, MD       . naloxone (NARCAN) injection 0.2 mg  0.2 mg Intravenous PRN Silis, Charlann Noss, MD       . polyethylene glycol (MIRALAX) packet 17 g  17 g Oral QD PRN Silis, Charlann Noss, MD       . sodium bicarbonate tablet 650 mg  650 mg Oral BID Silis, Charlann Noss, MD   650 mg at 04/29/14 0901   . sodium chloride 0.9 % bolus 100 mL  100 mL  Intravenous Q1H PRN Mosie Lukes, MD       . sodium chloride 0.9 % bolus 250 mL  250 mL Intravenous PRN Mosie Lukes, MD       . sodium chloride 0.9 % bolus 250 mL  250 mL Intravenous PRN Mosie Lukes, MD       . venlafaxine [facility]) tablet 25 mg  25 mg Oral BID Jones Skene, MD   25 mg at 04/29/14 0901       Physical Exam:     Filed Vitals:    04/29/14 1336   BP: 177/74   Pulse: 92   Temp: 98 F (36.7 C)   Resp: 16   SpO2: 98%     Temp (24hrs), Avg:97.5 F (36.4 C), Min:94.9 F (34.9 C), Max:98.3 F (36.8 C)      Telemetry reviewed no changes.     Intake and Output Summary (Last 24 hours) at Date Time    Intake/Output Summary (Last 24 hours) at 04/29/14 1417  Last data filed at 04/29/14 1330   Gross per 24 hour   Intake    300 ml   Output   4700 ml   Net  -4400 ml       General Appearance:  Breathing comfortable, no acute distress  Head:  normocephalic  Eyes:  EOM's intact  Neck:  No carotid bruit , ++ jugular venous distension, brisk carotid upstroke  Lungs:  Clear to auscultation throughout, no wheezes, rhonchi or rales, good respiratory effort   Chest Wall:  No tenderness or deformity, + scar well healed  Heart:  S1, S2 normal, no S3, no S4, no murmur, PMI not displaced, no rub   Abdomen:  Soft, non-tender, positive bowel sounds, no hepatojugular reflux  Extremities:  No cyanosis, clubbing , ++ edema, right femoral dialysis catheter present.  Pulses:  Equal radial  pulses, 4/4 symmetric  Neurologic:  Alert and oriented x3, mood and affect normal    Labs:   Recent CMP   Recent Labs      04/29/14   0354   GLUCOSE  118*   BUN  81.0*   CREATININE  4.0*   SODIUM  138   CO2  18*   CALCIUM  8.5     Recent CARDIAC ENZYMES No results for input(s): TROPONIN, ISTATTROPONI, CK in the last 24 hours.    Invalid input(s): TROPONINT, CKMB[24  Recent TSH Invalid input(s): TSH[24  Recent PT/PTT No results for input(s): INR, PTT in the last 24 hours.    Invalid input(s): PTI, COUM, COUMP, ACOAG, ACOAP[24  Recent CBC  WITH DIFF Recent Labs      04/29/14   0354   RBC  3.13*   HEMOGLOBIN  8.6*   HEMATOCRIT  26.4*   MCV  84.3   MCHC  32.6   RDW  19*   MPV  10.8   PLATELETS  204     Recent LIPID PANEL No results for input(s): CHOL, TRIG, HDL in the last 24 hours.    Invalid input(s): LDLC, VLDLC, LRAT[24  Recent ABG Recent Labs      04/28/14   1647   FIO2  28         Rads:   Radiological Procedure reviewed.    ECG-sinus rhythm rate 90/min , narrow QRS.

## 2014-04-29 NOTE — Treatment Plan (Signed)
Severe Sepsis Screen  Date: 04/29/2014 Time: 8:28 AM  Nurse Signature: Ernestina Columbia    A. Infection:    Does your patient have ONE or more of the following infection criteria?     [x]  Documented Infection - Does the patient have positive culture results (from blood,        sputum, urine, etc)?   [x]  Anti-Infective Therapy - Is the patient receiving antibiotic, antifungal, or other                anti-infective therapy?   []  Pneumonia - Is there documentation of pneumonia (X Ray, etc)?   []  WBC's - Have WBC's been found in normally sterile fluid (urine, CSF, etc.)?   []  Perforated Viscus -Does the patient have a perforated hollow organ (bowel)?    A.  Did you check any of the boxes above?    []  No  If No, Stop Here and Sepsis Screen Negative.               [x]  Yes, continue to section B      B. SIRS:     Does your patient have TWO or more of the following SIRS criteria (ensure vital signs & temperature are within 1 hour of this screening)?    []  Temperature - Is the patient's temperature: Temp: 98.2 F (36.8 C) (04/29/14 0750)   - Greater than or equal to 38.3 degrees C (greater than 100.9 degrees F)?   - Less than or equal to 36 degrees C (less than or equal to 96.8 degrees F)?    []  Heart Rate: Heart Rate: 60 (04/29/14 0800)   - Is the patient's heart rate greater than or equal to 90 bpm?    []  Respiratory: Resp Rate: 15 (04/29/14 0800)   - Is the patient's respiratory rate greater than or equal to 20?    []  WBC Count - Is the patient's WBC count:   Recent Labs  Lab 04/29/14  0354   WBC 7.89      - Greater than or equal to 12,000/mm3 OR   - Less than or equal to 4,000/mm3 OR    - Are there greater than 10% immature neutrophils (bands)?    []  Glucose >140 without diabetes?   Recent Labs  Lab 04/29/14  0354   GLUCOSE 118*       []  Significant edema?    B.  Did you check two or more of the boxes above?     [x]  No, Stop Here and Sepsis Screen Negative   []  Yes, continue to section C    C.  Acute Organ Dysfunction      Does your patient have ONE or more of the following organ dysfunction? (May need to wait for lab results for assessment - see below) Organ dysfunction must be a result of the sepsis not chronic conditions.    []  Cardiovascular - Does the patient have a: BP: 159/67 mmHg (04/29/14 0800)   -systolic Blood pressure less than or equal to 90 mmHg OR   -systolic blood pressure has dropped 40 mmHg or more from baseline OR   -mean arterial pressure less than or equal to 70 mmHg (for at least one hour   despite fluid resuscitation OR require vasopressor support?  []  Respiratory - Does the patient have new hypoxia defined by any of the following"   -A sustained increase in oxygen requirements by at least 2L/min on NC or 28%    FiO2 within the  last 24 hrs OR   -A persistent decrease in oxygen saturation of greater than or equal to 5% lasting   at least four or more hours and occurring within the last 24 hours  []  Renal - Does the patient have:   -low urine output (e.g. Less than 0.19mL/kg/HR for one hour despite adequate fluid    resuscitation, OR   -Increased creatinine (greater than 50% increase from baseline) OR   -require acute dialysis?  []  Hematologic - Does the patient have a:   -Low platelet count (less than 100,000 mm3)   Recent Labs  Lab 04/29/14  0354   PLATELETS 204   OR   -INR/aPTT greater than upper limit of normal?  Recent Labs  Lab 04/22/14  1409   PT INR 1.3*    or                    []  Metabolic - Does the patient have a high lactate (plasma lactate greater than or equal to 2.4 mMol/L)?   Recent Labs  Lab 04/28/14  1647   LACTIC ACID 1.3       []  Hepatic - Are the patient's liver enzymes elevated (ALT greater than 72 IU/L or Total      Bilirubin greater than 2 MG/dL)?    Recent Labs  Lab 04/22/14  1409   BILIRUBIN, TOTAL 0.3   ALT 88*     []  CNS - Does the patient have altered consciousness or reduced Glasgow Coma      Scale?    C.  Did you check any of the boxes above?     []  No, Sepsis Screen Negative   []   YES:  A) Infection + B) SIRS + C) Organ Dysfunction = Positive Screen for Severe Sepsis     Notify MD and document in Complex Assessment under provider notification   - Name of physician notified:                                         - Date/Time Notifiied:                                       Document actions: Following must be completed within 1 hour of positive sepsis screen   []  Lactate drawn (if initial lactate > , repeat lactate in 2 hours for goal decrease 10-20%)   []  Blood Cultures obtained (prior to antibiotic administration; if not done within the last 48 hours)   []  Antibiotics initiated or modified    []   IV Fluid administered 0.9% NS _______ mLs given (Initial Bolus of 30 ml/kg if SBP < 90 or MAP < 65 or lactate greater than 4 mmol/dl)  Nursing Comments?:     _________________________________________________________________    Patients meeting the following criteria are excluded from screening (check if applicable):   []  [place] hypothermia protocol   []  Comfort Care orders   []  Surgery- No screening for 24 hours after surgery (48 hours after CV surgery)       -If Yes. Date of surgery:______________________   []  Admitted with sepsis and until 72 hours after admission with documented sepsis (RESUME SEPSIS SCREEN AFTER 72 hour window!!!)      -If Yes, Date of Documented Sepsis:______________________   []  Positive screen  AND Completed sepsis bundle during previous 24 hours       -If Yes, Date/Time of positive severe sepsis screen:_______________________

## 2014-04-29 NOTE — Discharge Barriers (Signed)
04/29/2014/8:38 AM    Admit Dx: Symptomatic bradycardia, Kidney failure, poss UTI    Pathway:     Sepsis Screen: negative    Pain:  Soreness in left abdomen      Sedation:  No    DVT Prophylaxis: Yes    GI Prophylaxis: No    Integumentary: Right leg diabetic ulcer    Diet: NPO    BGL < 180: Yes        Glucose Monitoring: Yes    Antibiotic Day #: 2, invanz    Restraint Need Reviewed: No    Central Line Necessity Reviewed: Yes    PT/OT: No     Early Mobilization Category: 2    Proxy/Decision Maker Identified: No    Palliative Care: No    WRTC Notification Triggers: No                                    Notified: No  Plan: Dialysis today, see recommendations per cardiology    Participants:     Physician: Sherley Bounds                             Nursing: Yes                             Pharmacy: Yes                             Nutrition: Yes                             Case Management: Yes                             Respiratory: No                             Palliative Care: No                             PT/OT: No

## 2014-04-29 NOTE — Progress Notes (Signed)
PROGRESS NOTE    Date Time: 04/29/2014 6:35 PM  Patient Name: TANISE, RUSSMAN      Subjective:   Bun 81  crt 4  Wbc 7  hct 26    Temp 98  SBP 172        Review of Systems:   A comprehensive review of systems was: General ROS: negative for - chills, fever or night sweats  Psychological ROS: negative for - anxiety, depression, disorientation, hallucinations or suicidal ideation  ENT ROS: negative for - headaches, nasal congestion, sinus pain or visual changes  Allergy and Immunology ROS: negative for - itchy/watery eyes, nasal congestion or postnasal drip  Hematological and Lymphatic ROS: negative for - bleeding problems, bruising, pallor or weight loss  Endocrine ROS: negative for - malaise/lethargy or polydipsia/polyuria  Respiratory ROS: negative for - cough, orthopnea, shortness of breath or wheezing  Cardiovascular ROS: negative for - chest pain, dyspnea on exertion, orthopnea or shortness of breath  Gastrointestinal ROS: negative for - abdominal pain, constipation, diarrhea or nausea/vomiting  Genito-Urinary ROS: negative for - change in urinary stream, dysuria, hematuria or nocturia  Musculoskeletal ROS: negative for - joint pain, joint stiffness or joint swelling  Neurological ROS: negative for - confusion, dizziness, headaches, memory loss or speech problems  Dermatological ROS: negative for pruritus, rash and skin lesion changes    Physical Exam:     Filed Vitals:    04/29/14 1800   BP: 172/90   Pulse: 62   Temp:    Resp: 17   SpO2:        General appearance -lethargic  Mental status -lethargic  Eyes - pupils equal and reactive, extraocular eye movements intact  Nose - normal and patent, no erythema, discharge or polyps  Mouth - mucous membranes moist, pharynx normal without lesions  Neck - supple, no significant adenopathy  Lymphatics - no palpable lymphadenopathy, no hepatosplenomegaly  Chest - clear to auscultation, no wheezes, rales or rhonchi, symmetric air entry  Heart - normal rate, regular rhythm,  normal S1, S2, no murmurs, rubs, clicks or gallops  Abdomen - soft, nontender, nondistended, no masses or organomegaly  Neurological - lsthargic  Musculoskeletal - no joint tenderness, deformity or swelling  Extremities - peripheral pulses normal, no pedal edema, no clubbing or cyanosis  Skin - normal coloration and turgor, no rashes, no suspicious skin lesions noted    Medications:     Current Facility-Administered Medications   Medication Dose Route Frequency   . [START ON 04/30/2014] aspirin EC  81 mg Oral Daily   . atorvastatin  10 mg Oral QHS   . calcitRIOL  0.25 mcg Oral Daily   . docusate sodium  100 mg Oral Daily   . ertapenem  500 mg Intravenous Q24H   . fluticasone-salmeterol  1 puff Inhalation BID   . heparin (porcine)  5,000 Units Subcutaneous Q12H [facility]   . hydrALAZINE  100 mg Oral Q8H [facility]   . venlafaxine  25 mg Oral BID       Intake and Output Summary (Last 24 hours) at Date Time    Intake/Output Summary (Last 24 hours) at 04/29/14 1835  Last data filed at 04/29/14 1330   Gross per 24 hour   Intake    300 ml   Output   4700 ml   Net  -4400 ml           Labs:     Recent Labs  Lab 04/29/14  0354 04/28/14  0918 04/27/14  0934   SODIUM 138 135* 135*   POTASSIUM 4.3 5.3* 5.6*   CHLORIDE 104 107 107   CO2 18* 16* 16*   BUN 81.0* 103* 96*   CREATININE 4.0* 4.8* 4.0*   CALCIUM 8.5 8.8 8.9   GLUCOSE 118* 213* 196*       Recent Labs  Lab 04/29/14  0354   WBC 7.89   HEMOGLOBIN 8.6*   HEMATOCRIT 26.4*   PLATELETS 204               Rads:     Radiology Results (24 Hour)     Procedure Component Value Units Date/Time    Triple Lumen Dialysis Cath [[id number]] Resulted:  04/28/14 1911    Order Status:  Sent Updated:  04/28/14 2012            Assessment:     Patient Active Problem List   Diagnosis   . NSTEMI (non-ST elevated myocardial infarction)   . Hypertensive urgency   . PAD (peripheral artery disease)   . Arterial leg ulcer   . Chronic obstructive pulmonary disease, unspecified COPD type   . Ischemic cardiomyopathy    . CAD (coronary artery disease)   . Diabetes   . Chronic kidney disease   . Debilitated   . Complete heart block   . Junctional bradycardia   . Hyperkalemia   . Renal failure   . Bradycardia   . Acute on chronic renal failure   . Encephalopathy   . Azotemia       Plan:   Bradycardia  Junctional rhythm  Off BB     Hx CAD    Hx cardiomyopathy  Hx aortic stenosis     Acute on chronic RF   On HD    Encephalopathy  metablolic       DM SSI monitor glucose      R/O sepsis  Hypothermic resolved  Evaluated by ID  UC bld cx on invanz           Signed by: Laveda Norman, MD  04/29/2014  6:35 PM

## 2014-04-29 NOTE — Progress Notes (Signed)
PROGRESS NOTE    Date Time: 04/29/2014 3:02 PM  Patient Name: [name], [name]      Subjective:   Remain drowsy and obtunded, arousable to stimuli.  Started on hemodialysis yesterday.  Tolerated fairly well.  Stable vital signs today, improved, heart rate) in normal sinus rhythm.  Labs noted with normal WBC, stable hematocrit, improving renal function.,  Back on hemodialysis today.  Apparently patient was retaining urine and had a Foley catheter placed, 600 mL was obtained.  Specimen sent for culture.    Medications:      Scheduled Meds: PRN Meds:      [START ON 04/30/2014] aspirin EC 81 mg Oral Daily   atorvastatin 10 mg Oral QHS   calcitRIOL 0.25 mcg Oral Daily   docusate sodium 100 mg Oral Daily   ertapenem 500 mg Intravenous Q24H   fluticasone-salmeterol 1 puff Inhalation BID   heparin (porcine) 5,000 Units Subcutaneous Q12H SCH   hydrALAZINE 100 mg Oral Q8H SCH   sodium bicarbonate 650 mg Oral BID   venlafaxine 25 mg Oral BID         Continuous Infusions:      acetaminophen 650 mg Q4H PRN   Or     acetaminophen 650 mg Q4H PRN   acetaminophen 650 mg Q4H PRN   atropine 0.5 mg PRN   bisacodyl 10 mg QD PRN   dextrose 25 mL PRN   dextrose 25 mL PRN   dextrose 25 mL PRN   fentaNYL 12.5 mcg Q4H PRN   glucagon (rDNA) 1 mg PRN   glucagon (rDNA) 1 mg PRN   glucagon (rDNA) 1 mg PRN   insulin aspart 1-3 Units QHS PRN   insulin aspart 1-5 Units PRN   insulin aspart 1-5 Units TID AC PRN   naloxone 0.2 mg PRN   polyethylene glycol 17 g QD PRN   sodium chloride 100 mL Q1H PRN   sodium chloride 250 mL PRN           Review of Systems:   A comprehensive review of systems was: General ROS:[x]    negative other than : Obtunded  Respiratory ROS:[]  cough,[] shortness of breath,  []  wheezing  Cardiovascular ROS:  []  chest pain []  dyspnea on exertion  Gastrointestinal ROS: []  abdominal pain, [] change in bowel habits,  [] black/ bloody stools  Genito-Urinary ROS: []  dysuria,[]  trouble voiding,[]  hematuria  Musculoskeletal ROS:[]   negative  Neurological ROS:[]  negative    Physical Exam:     Filed Vitals:    04/29/14 1336   BP: 177/74   Pulse: 92   Temp: 98 F (36.7 C)   Resp: 16   SpO2: 98%       Intake and Output Summary (Last 24 hours) at Date Time    Intake/Output Summary (Last 24 hours) at 04/29/14 1502  Last data filed at 04/29/14 1330   Gross per 24 hour   Intake    300 ml   Output   4700 ml   Net  -4400 ml       General appearance [x]  obtunded, [] well appearing,  [x] no distress  Eyes -[x]  pupils equal and[x] reactive, -[]   extraocular eye movements intact  Mouth -[x]  mucous membranes moist, [] pharynx normal without lesions  Neck -[x]   supple,  [] no adenopathy, [x] no JVD,[] Thyromegaly.  Lymphatics -[x]  no palpable lymphadenopathy  Chest - []  on 2 [name] O2 saturation 98 percent, []  wheezes, [] rales[x]  rhonchi, [x] symmetric air entry  Heart - [x] normal rate, [x] regular rhythm, [x] normal S1, S2, []  murmurs,[]  rubs, []   gallops  Abdomen - [x] soft,[x]  nontender,[x]  nondistended,[]  no masses[]  organomegaly  Neurological - [x]  obtunded, arousable to stimuli,[x]  oriented x2,[]  normal speech,[]  no focal findings or movement disorder noted  Musculoskeletal - []  joint tenderness,[]  deformity [] swelling  Extremities -[x]  peripheral pulses normal,[]   pedal edema,[]  clubbing []  cyanosis  Skin - [] normal coloration and turgor, []  rashes,[]   skin lesions noted                Labs:     Results     Procedure Component Value Units Date/Time    Ammonia [[id number]] Collected:  04/29/14 1416    Specimen Information:  Blood Updated:  04/29/14 1436     Ammonia 42 umol/[name]     Glucose Whole Blood - POCT [[id number]]  (Abnormal) Collected:  04/29/14 1116     POCT - Glucose Whole blood 118 (H) mg/dL Updated:  78/29/56 2130    Urine culture [[id number]] Collected:  04/29/14 0354    Specimen Information:  Urine / Urine, Catheterized, Foley Updated:  04/29/14 0808    Glucose Whole Blood - POCT [[id number]]  (Abnormal) Collected:  04/29/14 0750     POCT - Glucose Whole blood  124 (H) mg/dL Updated:  44/01/02 7253    MRSA culture [[id number]] Collected:  04/29/14 0034    Specimen Information:  Body Fluid / Nares and Throat Updated:  04/29/14 0516    MRSA culture [[id number]] Collected:  04/29/14 0034    Specimen Information:  Body Fluid / Nares and Throat Updated:  04/29/14 0516    Basic Metabolic Panel [[id number]]  (Abnormal) Collected:  04/29/14 0354    Specimen Information:  Blood Updated:  04/29/14 0509     Glucose 118 (H) mg/dL      BUN 84.1 (H) mg/dL      Creatinine 4.0 (H) mg/dL      CALCIUM 8.5 mg/dL      Sodium 660 mEq/[name]      Potassium 4.3 mEq/[name]      Chloride 104 mEq/[name]      CO2 18 ([name]) mEq/[name]      Anion Gap 16.0 (H)     Magnesium [[id number]] Collected:  04/29/14 0354    Specimen Information:  Blood Updated:  04/29/14 0509     Magnesium 2.3 mg/dL     Phosphorus [[id number]] Collected:  04/29/14 0354    Specimen Information:  Blood Updated:  04/29/14 0509     Phosphorus 4.5 mg/dL     Hemolysis index [[id number]] Collected:  04/29/14 0354     Hemolysis Index 11 Updated:  04/29/14 0509    GFR [[id number]] Collected:  04/29/14 0354     EGFR 13.6 Updated:  04/29/14 0509    CBC without differential [[id number]]  (Abnormal) Collected:  04/29/14 0354    Specimen Information:  Blood / Blood Updated:  04/29/14 0505     WBC 7.89 x10 3/uL      Hgb 8.6 ([name]) g/dL      Hematocrit 06.2 ([name]) %      Platelets 204 x10 3/uL      RBC 3.13 ([name]) x10 6/uL      MCV 84.3 fL      MCH 27.5 ([name]) pg      MCHC 32.6 g/dL      RDW 19 (H) %      MPV 10.8 fL      Nucleated RBC 2 (H) /100 WBC     Hepatitis C (HCV) antibody, Total [[id number]] Collected:  04/28/14 2100    Specimen Information:  Blood Updated:  04/28/14 2327     Hepatitis C, AB Non-Reactive     Hepatitis B (HBV) Surface Antigen [[id number]] Collected:  04/28/14 2100    Specimen Information:  Blood Updated:  04/28/14 2327     Hepatitis B Surface AG Non-Reactive     Hemolysis index [[id number]] Collected:  04/28/14 2100     Hemolysis Index 6 Updated:  04/28/14 2257     Glucose Whole Blood - POCT [[id number]]  (Abnormal) Collected:  04/28/14 2212     POCT - Glucose Whole blood 105 (H) mg/dL Updated:  08/65/78 4696    Hepatitis B e antibody [[id number]] Collected:  04/28/14 2058    Specimen Information:  Blood Updated:  04/28/14 2113    Blood gas, arterial [[id number]]  (Abnormal) Collected:  04/28/14 1647    Specimen Information:  Blood, Arterial Updated:  04/28/14 1649     pH, Arterial 7.314 ([name])      pCO2, Arterial 35.5 mmhg      pO2, Arterial 85.5 mmhg      HCO3, Arterial 17.5 ([name]) mEq/[name]      Arterial Total CO2 16.8 ([name]) mEq/[name]      Base Excess, Arterial -7.5 ([name]) mEq/[name]      O2 Sat, Arterial 94.3 ([name]) %      ABG CollectionSite Right Radl      Allen's Test Yes      Temperature 37.0      FIO2 28 %      Status Oxygen      O2 Delivery Nasal Cannula      O2 Flow 2.0 [name]/min     Co-Oximetry Profile [[id number]]  (Abnormal) Collected:  04/28/14 1647     Total Hemoglobin 9.1 ([name]) g/dL Updated:  42/59/56 3875     Carboxyhemoglobin 1.8 %      Oxygenated Hemoglobin 91.8 %      Methemoglobin 0.9 %      O2 Content 11.8      Total Hematocrit Calculated 28.1 ([name]) %     Calcium, ionized [[id number]] Collected:  04/28/14 1647     Calcium, Ionized 2.33 mEq/[name] Updated:  04/28/14 1649    Lactic acid, plasma [[id number]] Collected:  04/28/14 1647     Lactic acid 1.3 mmol/[name] Updated:  04/28/14 1649    CREATININE WHOLE BLOOD [[id number]]  (Abnormal) Collected:  04/28/14 1647     Creatinine Whole Blood 5.01 (HH) mg/dL Updated:  55/73/22 0254    ELECTROLYTES WHOLE BLOOD [[id number]]  (Abnormal) Collected:  04/28/14 1647     Sodium Whole Blood 137 mEq/[name] Updated:  04/28/14 1649     Potassium Whole Blood 4.9 mEq/[name]      Chloride, WB 106 mEq/[name]      Glucose Whole Blood 142 (H) mg/dL     Glucose Whole Blood - POCT [[id number]]  (Abnormal) Collected:  04/28/14 1624     POCT - Glucose Whole blood 144 (H) mg/dL Updated:  07/37/10 6269          Rads:     Radiology Results (24 Hour)     Procedure Component Value Units Date/Time     Triple Lumen Dialysis Cath [[id number]] Resulted:  04/28/14 1911    Order Status:  Sent Updated:  04/28/14 2012            Assessment/Plan:     Severe bradyarrhythmias: Junctional rhythm, likely metabolic, underlying renal disease along with chronic use of beta blocker.  Significantly improved, off beta blocker and on hemodialysis, continue to monitor, no  need for further intervention.    Encephalopathy: Acute, metabolic.  Seems to be responding to hemodialysis, and improving heart rate.  Rule out underlying infection, urine cultures been sent.    Anemia: Seems to be chronic, overall stable hematocrit, continue to monitor.    Renal insufficiency: Acute on chronic, started on hemodialysis, tolerating well.    Diabetes mellitus: Continue Accu-Chek, seems to be fairly controlled.    Debility: PT, OT as tolerated.    Contrast the ICU in a.m. if stable.  Plan of care discussed with nursing staff.    Critical care time 35 minutes.        Signed by: Margarite Gouge, MD

## 2014-04-29 NOTE — Progress Notes (Signed)
Nutritional Support Services  Nutrition Assessment    [name] 64 y.o. female   MRN: [id number]    Summary of Nutrition Recommendations:  1. Continue Consistent CHO diet    2. 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 Diet NPO time specified Except for: SIPS WITH MEDS   Intake: very poor - per RN only took one bite noodles and one bite chocolate mousse  Indication: possible procedure     ANTHROPOMETRIC  Anthropometrics  Height: 154.9 cm (5\' 1" )  Weight: 78.291 kg (172 lb 9.6 oz)  Weight Change: 0  IBW/kg (Calculated) Female: 50.91 kg  IBW/kg (Calculated) Female: 47.73 kg  BMI (calculated): 32.7    Physical Assessment: pt asleep-unable to perform full physical assessment   Skin: arterial ankle wound white/yellow wound bed   GI function: hypoactive bowel sounds     ESTIMATED NEEDS  Estimated Energy Needs  Total Energy Estimated Needs: 1523-1650 kcal/day   Method for Estimating Needs: [employer] with AF 1.2 -1.3    Estimated Protein Needs  Total Protein Estimated Needs: 94-117 gm pro/day   Method for Estimating Needs: 1.2-1.5 gm pro/kg using wt 78.3 kg     Fluid Needs  Total Fluid Estimated Needs: 1500 ml/day   Method for Estimating Needs: 1 ml/kcal or per MD     Pertinent Medications: iron sucrose- infused, docusate sodium, cacitrol     Pertinent labs:  Reviewed: elevated BUN/Creatinine, low H/H- started HD today  POCT glucose: 105-195 mg/dL    Learning Needs: No     Nutrition Assessment Summary: Pt currently sleeping in bed- per RN drowsy for most of day- had first session of HD today.                                                               Nutrition Diagnosis      Inadequate oral intake related to poor appetite as evidenced by meal intake <25% of lunch.                                                               Intervention     Nutrition recommendation -   1. Continue Consistent  CHO diet    2. Consider addition of glucerna TID with meals       Goal: Tolerate diet with intake of 75% or greater                                                              Monitoring     Po intake, labs, GI function     Nutrition Risk Level: High- moderate     Waylan Boga, RD, CNSC  Spectralink : [phone]  Office: [phone]

## 2014-04-30 DIAGNOSIS — R001 Bradycardia, unspecified: Secondary | ICD-10-CM

## 2014-04-30 DIAGNOSIS — J449 Chronic obstructive pulmonary disease, unspecified: Secondary | ICD-10-CM

## 2014-04-30 DIAGNOSIS — I2510 Atherosclerotic heart disease of native coronary artery without angina pectoris: Secondary | ICD-10-CM

## 2014-04-30 LAB — GLUCOSE WHOLE BLOOD - POCT
Whole Blood Glucose POCT: 71 mg/dL (ref 70–100)
Whole Blood Glucose POCT: 94 mg/dL (ref 70–100)
Whole Blood Glucose POCT: 76 mg/dL (ref 70–100)
Whole Blood Glucose POCT: 108 mg/dL — ABNORMAL HIGH (ref 70–100)
Whole Blood Glucose POCT: 82 mg/dL (ref 70–100)
Whole Blood Glucose POCT: 164 mg/dL — ABNORMAL HIGH (ref 70–100)

## 2014-04-30 LAB — BASIC METABOLIC PANEL
Anion Gap: 14 (ref 5.0–15.0)
BUN: 51 mg/dL — ABNORMAL HIGH (ref 7.0–19.0)
CO2: 21 mEq/L — ABNORMAL LOW (ref 22–29)
Calcium: 8.3 mg/dL — ABNORMAL LOW (ref 8.5–10.5)
Chloride: 102 mEq/L (ref 100–111)
Creatinine: 3.2 mg/dL — ABNORMAL HIGH (ref 0.6–1.0)
Glucose: 74 mg/dL (ref 70–100)
Potassium: 3.7 mEq/L (ref 3.5–5.1)
Sodium: 137 mEq/L (ref 136–145)

## 2014-04-30 LAB — CBC
Hematocrit: 28.2 % — ABNORMAL LOW (ref 37.0–47.0)
Hgb: 8.9 g/dL — ABNORMAL LOW (ref 12.0–16.0)
MCH: 27.1 pg — ABNORMAL LOW (ref 28.0–32.0)
MCHC: 31.6 g/dL — ABNORMAL LOW (ref 32.0–36.0)
MCV: 85.7 fL (ref 80.0–100.0)
MPV: 10.3 fL (ref 9.4–12.3)
Nucleated RBC: 2 /100 WBC — ABNORMAL HIGH (ref 0–1)
Platelets: 205 10*3/uL (ref 140–400)
RBC: 3.29 10*6/uL — ABNORMAL LOW (ref 4.20–5.40)
RDW: 19 % — ABNORMAL HIGH (ref 12–15)
WBC: 5.96 10*3/uL (ref 3.50–10.80)

## 2014-04-30 LAB — TROPONIN I
Troponin I: 0.1 ng/mL — ABNORMAL HIGH (ref 0.00–0.09)
Troponin I: 0.07 ng/mL (ref 0.00–0.09)

## 2014-04-30 LAB — MAGNESIUM: Magnesium: 2.3 mg/dL (ref 1.6–2.6)

## 2014-04-30 LAB — PHOSPHORUS: Phosphorus: 4.1 mg/dL (ref 2.3–4.7)

## 2014-04-30 LAB — HEMOLYSIS INDEX: Hemolysis Index: 29 — ABNORMAL HIGH (ref 0–18)

## 2014-04-30 LAB — GFR: EGFR: 17.6

## 2014-04-30 MED ORDER — SODIUM CHLORIDE 0.9 % IV BOLUS
100.0000 mL | INTRAVENOUS | Status: AC | PRN
Start: 2014-04-30 — End: 2014-04-30

## 2014-04-30 MED ORDER — AMLODIPINE BESYLATE 5 MG PO TABS
10.0000 mg | ORAL_TABLET | Freq: Every day | ORAL | Status: DC
Start: 2014-04-30 — End: 2014-05-16
  Administered 2014-04-30 – 2014-05-16 (×16): 10 mg via ORAL
  Filled 2014-04-30 (×16): qty 2

## 2014-04-30 MED ORDER — CARVEDILOL 12.5 MG PO TABS
25.0000 mg | ORAL_TABLET | Freq: Two times a day (BID) | ORAL | Status: DC
Start: 2014-04-30 — End: 2014-05-01
  Administered 2014-04-30 – 2014-05-01 (×2): 25 mg via ORAL
  Filled 2014-04-30 (×2): qty 2

## 2014-04-30 MED ORDER — SODIUM CHLORIDE 0.9 % IV BOLUS
250.0000 mL | INTRAVENOUS | Status: AC | PRN
Start: 2014-04-30 — End: 2014-04-30

## 2014-04-30 MED ORDER — TAMSULOSIN HCL 0.4 MG PO CAPS
0.4000 mg | ORAL_CAPSULE | ORAL | Status: DC
Start: 2014-04-30 — End: 2014-05-02
  Administered 2014-04-30 – 2014-05-02 (×3): 0.4 mg via ORAL
  Filled 2014-04-30 (×3): qty 1

## 2014-04-30 MED ORDER — ISOSORBIDE MONONITRATE ER 30 MG PO TB24
30.0000 mg | ORAL_TABLET | Freq: Every day | ORAL | Status: DC
Start: 2014-04-30 — End: 2014-05-01
  Administered 2014-04-30: 30 mg via ORAL
  Filled 2014-04-30: qty 1

## 2014-04-30 MED ORDER — HYDRALAZINE HCL 50 MG PO TABS
100.0000 mg | ORAL_TABLET | Freq: Once | ORAL | Status: AC
Start: 2014-04-30 — End: 2014-04-30
  Administered 2014-04-30: 100 mg via ORAL

## 2014-04-30 NOTE — PT Eval Note (Signed)
[facility]  Physical Therapy Evaluation and Treatment    Patient: CERENA BAINE     MRN#: [id number]   Unit: MED SURG ICU 1  Bed: A2919/A2919-01    Time of Evaluation:  Time Calculation  PT Received On: 04/30/2014  Start Time: 5409  Stop Time: 0804  Time Calculation (min): 15    Time of Treatment:  Time Calculation  PT Received On: 04/30/2014  Start Time: 0804  Stop Time: 0819  Time Calculation (min): 15    Consult received for [name] for PT Evaluation and Treatment.  Patient's medical condition is appropriate for Physical therapy intervention at this time.    Activity Orders: none specified    Precautions and Contraindications:   Precautions  Weight Bearing Status: no restrictions  Other Precautions: contact isolation    Medical Diagnosis: Diabetes mellitus [E11.9]    History of Present Illness: NOREENE BOREMAN is a 64 y.o. female admitted on 04/28/2014 with acute on CRF and pacer.  Transferred from [facility].  RRT on 04/28/2014 for HR less than 50 bpm.  Dialysis cath placed on 04/28/2014.    Patient Active Problem List   Diagnosis   . NSTEMI (non-ST elevated myocardial infarction)   . Hypertensive urgency   . PAD (peripheral artery disease)   . Arterial leg ulcer   . Chronic obstructive pulmonary disease, unspecified COPD type   . Ischemic cardiomyopathy   . CAD (coronary artery disease)   . Diabetes   . Chronic kidney disease   . Debilitated   . Complete heart block   . Junctional bradycardia   . Hyperkalemia   . Renal failure   . Bradycardia   . Acute on chronic renal failure   . Encephalopathy   . Azotemia        Past Medical/Surgical History:  Past Medical History   Diagnosis Date   . Diabetes mellitus    . Chronic kidney disease    . Myocardial infarction    . Hypertension    . Hyperlipidemia    . Peripheral arterial disease      06/28/13 right superficial femoral artery stenosis, tibial peroneal trunk stenosis, left CVL a stenosis, treated with angiography and angioplasty.   . Coronary artery disease      Left  circumflex artery presumed DES.,  Chronically occluded right coronary artery stent   . Anemia      She redid to chronic disease and iron deficiency.     . Arterial leg ulcer      2014, receiving home health   . Chronic obstructive pulmonary disease      Previously on Spiriva and Advair      Past Surgical History   Procedure Laterality Date   . Cardiac angiography and angioplasty  06/2013   . Colonoscopy  Unknown   . Tubal ligation     . Coronary artery bypass N/A 03/04/2014     Procedure: CORONARY ARTERY BYPASS;  Surgeon: Austin Miles, MD;  Location: [facility] HEART OR;  Service: Cardiovascular;  Laterality: N/A;  LIMA to LAD  SVG to OM   . Endoscopic,vein harvest Left 03/04/2014     Procedure: ENDOSCOPIC,VEIN HARVEST;  Surgeon: Austin Miles, MD;  Location[facility] HEART [facility];  Service: Cardiovascular;  Laterality: Left;  By D. Lazarus Gowda, RNFA. Left groin to mid-calf.   Rhae Hammock N/A 03/04/2014     Procedure: TEE;  Surgeon: Austin Miles, MD;  Location: [facility] HEART OR;  Service: Cardiovascular;  Laterality: N/A;  probe [phone]  X-Rays/Tests/Labs:  Lab Results   Component Value Date/Time    HGB 8.9* 04/30/2014 04:20 AM    HEMATOCRIT 28.2* 04/30/2014 04:20 AM    POTASSIUM 3.7 04/30/2014 04:20 AM    SODIUM 137 04/30/2014 04:20 AM     XR Chest AP Portable 04/28/2014: Stable right effusion and adjacent edema     Social History:  Prior Level of Function  Prior level of function: Ambulates with assistive device, Independent with ADLs  Assistive Device: Front wheel walker  Baseline Activity Level: Household ambulation  Driving: does not drive  Cooking: Yes  DME Currently at Home: [employer] walker    Home Living Arrangements  Living Arrangements: Children  Type of Home: Apartment  Home Layout: One level  Bathroom Shower/Tub: Medical sales representative: Standard  DME Currently at Home: Front wheel walker  Home Living - Notes / Comments: lives with daughter    Subjective: Patient is agreeable to participation in the  therapy session. Nursing clears patient for therapy.   Patient Goal: none stated  Pain Assessment  Pain Assessment: Numeric Scale (0-10)  Pain Score: 5-moderate pain  POSS Score: Slightly drowsy, easily aroused  Pain Location: Foot  Pain Orientation: Right  Pain Descriptors: Pins and needles;Stabbing  Pain Frequency: Constant/continuous  Effect of Pain on Daily Activities: mild  Pain Intervention(s): Medication (See eMAR)    Objective:  Observation of Patient/Vital Signs: BP 164/73, Pulse 68, SpO2 97%    Patient received in bed with Foley, Intravenous (IV), O2 at 2 liters/minute via nc , Sequential Compression Device (SCD) on LLE, Dressing and Blood Pressure Cuff in place.      Inspection/Posture: dressing R heel, midfoot    Cognitive Status and Neuro Exam:  Cognition/Neuro Status  Arousal/Alertness: Appropriate responses to stimuli  Attention Span: Attends to task with redirection  Orientation Level: Oriented X4  Memory: Appears intact  Following Commands: Follows multistep commands with increased time;Follows multistep commands with repetition  Safety Awareness: minimal verbal instruction  Insights: Decreased awareness of deficits;Educated in safety awareness  Problem Solving: minimal assistance  Behavior: calm;cooperative  Motor Planning: intact  Coordination: intact  Neuro Status RETIRED  Behavior: calm;cooperative  Motor Planning: intact  Coordination: intact    Musculoskeletal Examination  Gross ROM  Neck/Trunk ROM: within functional limits  Right Upper Extremity ROM: within functional limits  Left Upper Extremity ROM: within functional limits  Right Lower Extremity ROM: within functional limits  Left Lower Extremity ROM: within functional limits  Gross Strength  Neck/Trunk Strength: 4/5  Right Upper Extremity Strength: within functional limits  Left Upper Extremity Strength: within functional limits  Right Lower Extremity Strength: 3+/5  Left Lower Extremity Strength: 3+/5  Tone  Tone: within functional  limits    Functional Mobility:  Functional Mobility  Rolling: Supervision  Supine to Sit: Minimal Assist;Increased Effort;to Right  Sit to Stand: Minimal Assist;Increased Effort;with instruction for hand placement to increase safety  Stand to Sit: Minimal Assist  Transfers  Bed to Chair: Minimal Assist;to Right  Locomotion  Ambulation: Minimal Assist  Ambulation Distance (Feet): 2 Feet (steps to chair)     Balance  Balance  Balance: needs focused assessment  Sitting - Dynamic: Good  Standing - Static:  (Fair+)  Standing - Dynamic:  (Fair+)    Participation and Activity Tolerance  Participation and Endurance  Participation Effort: good  Endurance: Tolerates 10 - 20 min exercise with multiple rests  [place] Dyspnea Scale: 0 Dyspnea    Educated the patient  to role of physical therapy, plan of care, goals of therapy and safety with mobility and ADLs.   Patient left in bedside chair with oxygen in place and call bell within reach. RN notified of session outcome.     Assessment: DOLORA RIDGELY is a 64 y.o. female admitted 04/28/2014 .    Pt presents with the following impairments: Assessment: Decreased LE strength;Decreased endurance/activity tolerance;Decreased functional mobility;Decreased balance;Gait impairment.    Pt would benefit from Physical Therapy to maximize safety and independence with functional mobility to return to PLOF at home.    Treatment: MinA transfer to bedside chair with vc for sequencing and pacing of activity, RN present.  Instructed in seated BLE therex for hip flex, hip abd/add, knee ext and ankle pumps 1 x 10 reps each.      Plan: Treatment/Interventions: Exercise, Gait training, Functional transfer training, LE strengthening/ROM, Endurance training, Bed mobility, Patient/family training   PT Frequency: 3-4x/wk   Risks/Benefits/POC Discussed with Pt/Family: With patient     G codes: yes  Mobility, Current Status (L2440): CK  Mobility, Goal Status (N0272): CJ       Goals:   Goals  Goal  Formulation: With patient  Time for Goal Acheivement: By time of discharge  Goals: Select goal  Pt Will Go Supine To Sit: independent  Pt Will Perform Sit to Stand: independent  Pt Will Ambulate: 101-150 feet, with rolling walker, with supervision  Pt Will Go Up / Down Stairs: 1-2 stairs, with supervision, With rail        DME Recommended for Discharge:  (already has FWW)  Discharge Recommendation: Home with supervision, Home with home health PT     [place], [place]  [id number]

## 2014-04-30 NOTE — Progress Notes (Signed)
Completed 3 hours of dialysis. 2.1 L UF tolerated well. Patient remained hypertensive throughout treatment. Denies any pain. Patient anxious about having dialysis. Report given to Myrlene Broker RN.     04/30/14 1738   Treatment Summary   Time Off Machine 1730   Duration of Treatment (Hours) 3   Dialyzer Clearance Moderately streaked   Fluid Volume Off (mL) 2500   Prime Volume (mL) 200   Rinseback Volume (mL) 200   Fluid Given: Normal Saline (mL) 0   Fluid Given: PRBC  0 mL   Fluid Given: Albumin (mL) 0   Fluid Given: Other (mL) 0   Total Fluid Given 400   Hemodialysis Net Fluid Removed 2100   Post Treatment Assessment   Post-Treatment Weight (Kg) (scale not working)   Permacath/Temporary Catheter with Pigtail 04/28/14 Femoral vein catheter Right   Placement Date/Time: 04/28/14 2030   Inserted by: CVIR  Access Type: Femoral vein catheter  Orientation: Right  Access Location: Femoral vein  Central Line Infection Prevention Education provided?: Yes  Hand Hygiene: Alcohol based hand scrub  Line car...   Line necessity reviewed? Yes   Catheter Lumen Volume Venous 1.4 mL   Catheter Lumen Volume Arterial 1.4 mL   Dressing Status and Intervention Dressing Intact;Clean & Dry   [place] Caps on Catheter Yes   Vitals   Temp 98.3 F (36.8 C)   Heart Rate 94   Resp Rate 20   BP 196/89 mmHg   SpO2 97 %   Assessment   Mental Status Alert;Oriented;Cooperative   Cardiac (WDL) X   Cardiac Regularity Regular   Cardiac Symptoms None   Cardiac Rhythm Sinus Tach   Respiratory  WDL   Respiratory Pattern Regular   Bilateral Breath Sounds Clear;Diminished   Edema  WDL   RLE Edema None   LLE Edema None   General Skin Color Appropriate for ethnicity   Skin Condition/Temp Warm;Dry   Gastrointestinal (WDL) X   Abdomen Inspection Soft;Nondistended   GI Symptoms None   Education   Person taught Patient   Knowledge basis Substantial   Topics taught Procedure;Tx Options   Teaching Tools Explain   Bedside Nurse Communication   Name of bedside RN -  post dialysis Myrlene Broker

## 2014-04-30 NOTE — Progress Notes (Signed)
PROGRESS NOTE    Date Time: 04/30/2014 11:50 AM  Patient Name: [name], [name]      Subjective:   Bun 51  crt 3.2  Wbc 5.9  hct 28    Temp 98  SBP 177        Review of Systems:   A comprehensive review of systems was: General ROS: negative for - chills, fever or night sweats  Psychological ROS: negative for - anxiety, depression, disorientation, hallucinations or suicidal ideation  ENT ROS: negative for - headaches, nasal congestion, sinus pain or visual changes  Allergy and Immunology ROS: negative for - itchy/watery eyes, nasal congestion or postnasal drip  Hematological and Lymphatic ROS: negative for - bleeding problems, bruising, pallor or weight loss  Endocrine ROS: negative for - malaise/lethargy or polydipsia/polyuria  Respiratory ROS: negative for - cough, orthopnea, shortness of breath or wheezing  Cardiovascular ROS: negative for - chest pain, dyspnea on exertion, orthopnea or shortness of breath  Gastrointestinal ROS: negative for - abdominal pain, constipation, diarrhea or nausea/vomiting  Genito-Urinary ROS: negative for - change in urinary stream, dysuria, hematuria or nocturia  Musculoskeletal ROS: negative for - joint pain, joint stiffness or joint swelling  Neurological ROS: negative for - confusion, dizziness, headaches, memory loss or speech problems  Dermatological ROS: negative for pruritus, rash and skin lesion changes    Physical Exam:     Filed Vitals:    04/30/14 1110   BP: 177/78   Pulse: 62   Temp:    Resp: 19   SpO2: 98%       General appearance -awake  Mental status -awake talking  Eyes - pupils equal and reactive, extraocular eye movements intact  Nose - normal and patent, no erythema, discharge or polyps  Mouth - mucous membranes moist, pharynx normal without lesions  Neck - supple, no significant adenopathy  Lymphatics - no palpable lymphadenopathy, no hepatosplenomegaly  Chest - clear to auscultation, no wheezes, rales or rhonchi, symmetric air entry  Heart - normal rate, regular  rhythm, normal S1, S2, no murmurs, rubs, clicks or gallops  Abdomen - soft, nontender, nondistended, no masses or organomegaly  Neurological - awake   Musculoskeletal - no joint tenderness, deformity or swelling  Extremities - peripheral pulses normal, no pedal edema, no clubbing or cyanosis  Skin - normal coloration and turgor, no rashes, no suspicious skin lesions noted    Medications:     Current Facility-Administered Medications   Medication Dose Route Frequency   . aspirin EC  81 mg Oral Daily   . atorvastatin  10 mg Oral QHS   . calcitRIOL  0.25 mcg Oral Daily   . docusate sodium  100 mg Oral Daily   . ertapenem  500 mg Intravenous Q24H   . fluticasone-salmeterol  1 puff Inhalation BID   . heparin (porcine)  5,000 Units Subcutaneous Q12H [facility]   . hydrALAZINE  100 mg Oral Q8H SCH   . isosorbide mononitrate  30 mg Oral Daily   . tamsulosin  0.4 mg Oral After breakfast   . venlafaxine  25 mg Oral BID       Intake and Output Summary (Last 24 hours) at Date Time    Intake/Output Summary (Last 24 hours) at 04/30/14 1150  Last data filed at 04/30/14 0900   Gross per 24 hour   Intake    790 ml   Output   2650 ml   Net  -1860 ml  Labs:       Recent Labs  Lab 04/30/14  0420 04/29/14  0354 04/28/14  0918   SODIUM 137 138 135*   POTASSIUM 3.7 4.3 5.3*   CHLORIDE 102 104 107   CO2 21* 18* 16*   BUN 51.0* 81.0* 103*   CREATININE 3.2* 4.0* 4.8*   CALCIUM 8.3* 8.5 8.8   GLUCOSE 74 118* 213*       Recent Labs  Lab 04/30/14  0420   WBC 5.96   HEMOGLOBIN 8.9*   HEMATOCRIT 28.2*   PLATELETS 205               Rads:     Radiology Results (24 Hour)     ** No results found for the last 24 hours. **            Assessment:     Patient Active Problem List   Diagnosis   . NSTEMI (non-ST elevated myocardial infarction)   . Hypertensive urgency   . PAD (peripheral artery disease)   . Arterial leg ulcer   . Chronic obstructive pulmonary disease, unspecified COPD type   . Ischemic cardiomyopathy   . CAD (coronary artery disease)   .  Diabetes   . Chronic kidney disease   . Debilitated   . Complete heart block   . Junctional bradycardia   . Hyperkalemia   . Renal failure   . Bradycardia   . Acute on chronic renal failure   . Encephalopathy   . Azotemia       Plan:   Bradycardia  Junctional rhythm  Off BB     Hx CAD    Hx cardiomyopathy  Hx aortic stenosis     Acute on chronic RF   On HD    Encephalopathy  metablolic       DM SSI monitor glucose      R/O sepsis  Hypothermic resolved  Evaluated by ID  UC bld cx on invanz           Signed by: Laveda Norman, MD  04/30/2014  11:50 AM

## 2014-04-30 NOTE — Plan of Care (Signed)
Problem: Renal Failure  Goal: Fluid and electrolyte balance are achieved/maintained  Intervention: Monitor intake and output every shift  Pt. Was dialyzed yesterday, 2ltrs of urine pulled.

## 2014-04-30 NOTE — Treatment Plan (Signed)
Admit Dx: Symptomatic bradycardia, acute renal failure    Pathway:     Sepsis Screen: negative    Pain:  No            Sedation:  No    DVT Prophylaxis: Yes    GI Prophylaxis: Yes    Integumentary: Wound on RLE     Diet: Consistent carb    BGL < 180: Yes        Glucose Monitoring: Yes    Antibiotic Day #: N/A    Restraint Need Reviewed: No    Central Line Necessity Reviewed: Yes    PT/OT: Yes     Early Mobilization Category: 2    Proxy/Decision Maker Identified: Yes    Palliative Care: No    WRTC Notification Triggers: No                                    Notified: No  Plan: Monitor HR. HD.     Participants:     Physician: Smirniotopoulos                             Nursing: Yes                              Pharmacy: No                             Nutrition: No                             Case Management: No                             Respiratory: No                             Palliative Care: No                             PT/OT: No

## 2014-04-30 NOTE — Plan of Care (Signed)
Problem: Physical Therapy  Goal: Patient condition is improving per Physical Therapy Treatment Plan  Outcome: Progressing  Discharge Recommendation: Home with supervision, Home with home health PT  DME Recommended for Discharge:  (already has [facility]WW)    Is an Occupational Therapy Evaluation Indicated at this time? Yes, an OT evaluation is indicated at this time.    Treatment/Interventions: Exercise, Gait training, Functional transfer training, LE strengthening/ROM, Endurance training, Bed mobility, Patient/family training  PT Frequency: 3-4x/wk     Early/Progressive Mobility Protocol Level: Step 5:OOB to Chair  (Please See Therapy Evaluation for device and assistance level needed)    Goals:   Goals  Goal Formulation: With patient  Time for Goal Acheivement: By time of discharge  Goals: Select goal  Pt Will Go Supine To Sit: independent  Pt Will Perform Sit to Stand: independent  Pt Will Ambulate: 101-150 feet, with rolling walker, with supervision  Pt Will Go Up / Down Stairs: 1-2 stairs, with supervision, With rail

## 2014-04-30 NOTE — Plan of Care (Signed)
Pt had HD today. 1.2 L removed. Remained hypertensive. Hr 52-91, SBP 150s-190s. 2 L NC, lung sounds clear, diminished.   Positive bowel sounds, poor appetite. BG 70s-low 100s. Bm today x2.  Dressing to RLE changed as ordered. Pt repositioned for comfort.   Pt up to chair today with PT.   Continue to monitor HR. Troponin Q6 hrs as ordered. EKG completed.

## 2014-04-30 NOTE — Progress Notes (Signed)
INFECTIOUS DISEASES PROGRESS NOTE    Date Time: 04/30/2014 4:12 PM  Patient Name: [name], [name]  Patient status.Inpatient  Hospital Day: 2    Assessment and Plan:   1.  UTI  -Cx still pending, GNR on gram stain  2. No fever or leucocytosis      PLAN: 1. Ertapenem pending results of urine culture  Subjective:   Awake and oriented  Seen on dialysis    Review of Systems:   Review of Systems - General ROS: negative    Antibiotics:   Day 3    Other medications reviewed in EPIC.  Central Access:   Fem line    Physical Exam:     Filed Vitals:    04/30/14 1600   BP: 167/77   Pulse: 91   Temp:    Resp: 20   SpO2: 97%       Chest - clear to auscultation, no wheezes, rales or rhonchi, symmetric air entry  Heart - normal rate, regular rhythm, normal S1, S2, no murmurs, rubs, clicks or gallops  Abdomen - soft, nontender, nondistended, no masses or organomegaly    Labs:     Microbiology Results     Procedure Component Value Units Date/Time    MRSA culture [[id number]] Collected:  04/29/14 0034    Specimen Information:  Body Fluid / Nares and Throat Updated:  04/29/14 0516    MRSA culture [[id number]] Collected:  04/29/14 0034    Specimen Information:  Body Fluid / Nares and Throat Updated:  04/29/14 0516    Urine culture [[id number]] Collected:  04/29/14 0354    Specimen Information:  Urine / Urine, Catheterized, Foley Updated:  04/29/14 0808          CBC w/Diff CMP     Recent Labs  Lab 04/30/14  0420 04/29/14  0354 04/25/14  1816   WBC 5.96 7.89 6.27   HEMOGLOBIN 8.9* 8.6* 9.6*   HEMATOCRIT 28.2* 26.4* 30.8*   PLATELETS 205 204 208   MCV 85.7 84.3 87.0   NEUTROPHILS  --   --  78       PT/INR           Recent Labs  Lab 04/30/14  0420 04/29/14  0354 04/28/14  0918  04/25/14  0414   SODIUM 137 138 135* More results in Results Review 135*   POTASSIUM 3.7 4.3 5.3* More results in Results Review 4.9   CHLORIDE 102 104 107 More results in Results Review 109   CO2 21* 18* 16* More results in Results Review 18*   BUN 51.0* 81.0* 103* More  results in Results Review 89*   CREATININE 3.2* 4.0* 4.8* More results in Results Review 3.4*   GLUCOSE 74 118* 213* More results in Results Review 123*   CALCIUM 8.3* 8.5 8.8 More results in Results Review 8.6   MAGNESIUM 2.3 2.3 2.9* More results in Results Review 2.7*   PHOSPHORUS 4.1 4.5  --   --  4.9*   More results in Results Review = values in this interval not displayed.   Glucose POCT     Recent Labs  Lab 04/30/14  0420 04/29/14  0354 04/28/14  0918 04/27/14  0934 04/26/14  0430 04/25/14  0414 04/24/14  0332   GLUCOSE 74 118* 213* 196* 182* 123* 133*          Rads:     Radiology Results (24 Hour)     ** No results found for the last 24 hours. **  Signed by: Ernst Breach

## 2014-04-30 NOTE — Treatment Plan (Signed)
Severe Sepsis Screen      A. Infection:    Does your patient have ONE or more of the following infection criteria?     []  Documented Infection - Does the patient have positive culture results (from blood,        sputum, urine, etc)?   []  Anti-Infective Therapy - Is the patient receiving antibiotic, antifungal, or other                anti-infective therapy?   []  Pneumonia - Is there documentation of pneumonia (X Ray, etc)?   []  WBC's - Have WBC's been found in normally sterile fluid (urine, CSF, etc.)?   []  Perforated Viscus -Does the patient have a perforated hollow organ (bowel)?    A.  Did you check any of the boxes above?    [x]  No  If No, Stop Here and Sepsis Screen Negative.               []  Yes, continue to section B      B. SIRS:     Does your patient have TWO or more of the following SIRS criteria (ensure vital signs & temperature are within 1 hour of this screening)?    []  Temperature - Is the patient's temperature: Temp: 98.3 F (36.8 C) (04/30/14 0600)   - Greater than or equal to 38.3 degrees C (greater than 100.9 degrees F)?   - Less than or equal to 36 degrees C (less than or equal to 96.8 degrees F)?    []  Heart Rate: Heart Rate: 77 (04/30/14 1300)   - Is the patient's heart rate greater than or equal to 90 bpm?    []  Respiratory: Resp Rate: (!) 26 (04/30/14 1300)   - Is the patient's respiratory rate greater than or equal to 20?    []  WBC Count - Is the patient's WBC count:   Recent Labs  Lab 04/30/14  0420   WBC 5.96      - Greater than or equal to 12,000/mm3 OR   - Less than or equal to 4,000/mm3 OR    - Are there greater than 10% immature neutrophils (bands)?    []  Glucose >140 without diabetes?   Recent Labs  Lab 04/30/14  0420   GLUCOSE 74       []  Significant edema?    B.  Did you check two or more of the boxes above?     []  No, Stop Here and Sepsis Screen Negative   []  Yes, continue to section C    C.  Acute Organ Dysfunction     Does your patient have ONE or more of the following organ  dysfunction? (May need to wait for lab results for assessment - see below) Organ dysfunction must be a result of the sepsis not chronic conditions.    []  Cardiovascular - Does the patient have a: BP: 185/82 mmHg (04/30/14 1300)   -systolic Blood pressure less than or equal to 90 mmHg OR   -systolic blood pressure has dropped 40 mmHg or more from baseline OR   -mean arterial pressure less than or equal to 70 mmHg (for at least one hour   despite fluid resuscitation OR require vasopressor support?  []  Respiratory - Does the patient have new hypoxia defined by any of the following"   -A sustained increase in oxygen requirements by at least 2L/min on NC or 28%    FiO2 within the last 24 hrs OR   -A persistent  decrease in oxygen saturation of greater than or equal to 5% lasting   at least four or more hours and occurring within the last 24 hours  []  Renal - Does the patient have:   -low urine output (e.g. Less than 0.86mL/kg/HR for one hour despite adequate fluid    resuscitation, OR   -Increased creatinine (greater than 50% increase from baseline) OR   -require acute dialysis?  []  Hematologic - Does the patient have a:   -Low platelet count (less than 100,000 mm3)   Recent Labs  Lab 04/30/14  0420   PLATELETS 205   OR   -INR/aPTT greater than upper limit of normal?   or                    []  Metabolic - Does the patient have a high lactate (plasma lactate greater than or equal to 2.4 mMol/L)?   Recent Labs  Lab 04/28/14  1647   LACTIC ACID 1.3       []  Hepatic - Are the patient's liver enzymes elevated (ALT greater than 72 IU/L or Total      Bilirubin greater than 2 MG/dL)?      []  CNS - Does the patient have altered consciousness or reduced Glasgow Coma      Scale?    C.  Did you check any of the boxes above?     []  No, Sepsis Screen Negative   []  YES:  A) Infection + B) SIRS + C) Organ Dysfunction = Positive Screen for Severe Sepsis     Notify MD and document in Complex Assessment under provider notification   - Name  of physician notified:                                         - Date/Time Notifiied:                                       Document actions: Following must be completed within 1 hour of positive sepsis screen   []  Lactate drawn (if initial lactate > , repeat lactate in 2 hours for goal decrease 10-20%)   []  Blood Cultures obtained (prior to antibiotic administration; if not done within the last 48 hours)   []  Antibiotics initiated or modified    []   IV Fluid administered 0.9% NS _______ mLs given (Initial Bolus of 30 ml/kg if SBP < 90 or MAP < 65 or lactate greater than 4 mmol/dl)  Nursing Comments?:     _________________________________________________________________    Patients meeting the following criteria are excluded from screening (check if applicable):   []  [place] hypothermia protocol   []  Comfort Care orders   []  Surgery- No screening for 24 hours after surgery (48 hours after CV surgery)       -If Yes. Date of surgery:______________________   []  Admitted with sepsis and until 72 hours after admission with documented sepsis (RESUME SEPSIS SCREEN AFTER 72 hour window!!!)      -If Yes, Date of Documented Sepsis:______________________   []  Positive screen AND Completed sepsis bundle during previous 24 hours       -If Yes, Date/Time of positive severe sepsis screen:_______________________

## 2014-04-30 NOTE — Progress Notes (Signed)
PROGRESS NOTE  [facility]  [address]  [place], [place] [address]  [phone]  Fax [phone]    Date Time: 04/30/2014 11:19 AM  Patient Name: [name], [name]      Assessment:   Bradycardia - intermittent junctional   HTN ( uncontrolled)  Coronary artery disease   - CABG 2015  CKD  DM      Problem List:     Active Hospital Problems    Diagnosis   . Renal failure   . Bradycardia   . Acute on chronic renal failure   . Encephalopathy   . Azotemia   . CAD (coronary artery disease)   . Chronic obstructive pulmonary disease, unspecified COPD type        Plan:   Continue ASA  Check serial troponin x 3  Check EKG this am  Continue hydralazine  Add imdur  Continue to avoid AV nodal blocking agents      Subjective:   No chest pain    Medications:     Current Facility-Administered Medications   Medication Dose Route Frequency Last Rate Last Dose   . acetaminophen (TYLENOL) tablet 650 mg  650 mg Oral Q4H PRN   650 mg at 04/29/14 0421    Or   . acetaminophen (TYLENOL) suppository 650 mg  650 mg Rectal Q4H PRN       . acetaminophen (TYLENOL) tablet 650 mg  650 mg Oral Q4H PRN   650 mg at 04/29/14 0420   . aspirin EC tablet 81 mg  81 mg Oral Daily   81 mg at 04/30/14 0918   . atorvastatin (LIPITOR) tablet 10 mg  10 mg Oral QHS   10 mg at 04/29/14 2111   . atropine injection 0.5 mg  0.5 mg Intravenous PRN       . bisacodyl (DULCOLAX) suppository 10 mg  10 mg Rectal QD PRN       . calcitRIOL (ROCALTROL) capsule 0.25 mcg  0.25 mcg Oral Daily   0.25 mcg at 04/30/14 0918   . dextrose 50 % bolus 25 mL  25 mL Intravenous PRN       . dextrose 50 % bolus 25 mL  25 mL Intravenous PRN       . dextrose 50 % bolus 25 mL  25 mL Intravenous PRN       . docusate sodium (COLACE) capsule 100 mg  100 mg Oral Daily   100 mg at 04/30/14 0918   . ertapenem (INVanz) 500 mg in sodium chloride 0.9 % 100 mL IVPB  500 mg Intravenous Q24H 200 mL/hr at 04/30/14 0022 500 mg at 04/30/14 0022   . fentaNYL (SUBLIMAZE)  injection 12.5 mcg  12.5 mcg Intravenous Q4H PRN       . fluticasone-salmeterol (ADVAIR DISKUS) 250-50 MCG/DOSE 1 puff  1 puff Inhalation BID   1 puff at 04/30/14 1039   . glucagon (rDNA) (GLUCAGEN) injection 1 mg  1 mg Intramuscular PRN       . glucagon (rDNA) (GLUCAGEN) injection 1 mg  1 mg Intramuscular PRN       . glucagon (rDNA) (GLUCAGEN) injection 1 mg  1 mg Intramuscular PRN       . heparin (porcine) injection 5,000 Units  5,000 Units Subcutaneous Q12H SCH   5,000 Units at 04/30/14 0918   . hydrALAZINE (APRESOLINE) tablet 100 mg  100 mg Oral Q8H SCH   100 mg at 04/30/14 0547   . insulin aspart (  NovoLOG) injection 1-3 Units  1-3 Units Subcutaneous QHS PRN       . insulin aspart (NovoLOG) injection 1-5 Units  1-5 Units Subcutaneous PRN       . insulin aspart (NovoLOG) injection 1-5 Units  1-5 Units Subcutaneous TID AC PRN       . naloxone (NARCAN) injection 0.2 mg  0.2 mg Intravenous PRN       . polyethylene glycol (MIRALAX) packet 17 g  17 g Oral QD PRN       . venlafaxine [facility]) tablet 25 mg  25 mg Oral BID   25 mg at 04/30/14 0918            Physical Exam:     Filed Vitals:    04/30/14 1110   BP: 177/78   Pulse: 62   Temp:    Resp: 19   SpO2: 98%       Intake and Output Summary (Last 24 hours) at Date Time    Intake/Output Summary (Last 24 hours) at 04/30/14 1119  Last data filed at 04/30/14 0600   Gross per 24 hour   Intake    550 ml   Output   2650 ml   Net  -2100 ml       cv - rr , sem @ [facility]  Lungs:cta anter  Abd:s  ZOX:WRUE    Labs:     Results     Procedure Component Value Units Date/Time    Glucose Whole Blood - POCT [[id number]] Collected:  04/30/14 0822     POCT - Glucose Whole blood 76 mg/dL Updated:  14/78/29 5621    Urine culture [[id number]] Collected:  04/29/14 0354    Specimen Information:  Urine / Urine, Catheterized, Foley Updated:  04/30/14 0640    Narrative:      ORDER#: [id number]                                    ORDERED BY: AL KHOURI, SAMI  SOURCE: Urine, Catheterized, Foley                    COLLECTED:  04/29/14 03:54  ANTIBIOTICS AT COLL.:                                RECEIVED :  04/29/14 08:08  Culture Urine                              PRELIM      04/30/14 06:40   +  04/30/14   >100,000 CFU/ML Gram negative rod               Identification and susceptibility to follow        Glucose Whole Blood - POCT [[id number]] Collected:  04/30/14 0550     POCT - Glucose Whole blood 94 mg/dL Updated:  25/36/64 4034    Basic Metabolic Panel [[id number]]  (Abnormal) Collected:  04/30/14 0420    Specimen Information:  Blood Updated:  04/30/14 0501     Glucose 74 mg/dL      BUN 75.6 (H) mg/dL      Creatinine 3.2 (H) mg/dL      CALCIUM 8.3 (L) mg/dL      Sodium 433 mEq/L      Potassium 3.7 mEq/L  Chloride 102 mEq/L      CO2 21 (L) mEq/L      Anion Gap 14.0     Magnesium [[id number]] Collected:  04/30/14 0420    Specimen Information:  Blood Updated:  04/30/14 0501     Magnesium 2.3 mg/dL     Phosphorus [[id number]] Collected:  04/30/14 0420    Specimen Information:  Blood Updated:  04/30/14 0501     Phosphorus 4.1 mg/dL     Hemolysis index [[id number]]  (Abnormal) Collected:  04/30/14 0420     Hemolysis Index 29 (H) Updated:  04/30/14 0501    GFR [[id number]] Collected:  04/30/14 0420     EGFR 17.6 Updated:  04/30/14 0501    Glucose Whole Blood - POCT [[id number]] Collected:  04/30/14 0413     POCT - Glucose Whole blood 71 mg/dL Updated:  01/12/24 3664    CBC without differential [[id number]]  (Abnormal) Collected:  04/30/14 0420    Specimen Information:  Blood / Blood Updated:  04/30/14 0434     WBC 5.96 x10 3/uL      Hgb 8.9 (L) g/dL      Hematocrit 56.3 (L) %      Platelets 205 x10 3/uL      RBC 3.29 (L) x10 6/uL      MCV 85.7 fL      MCH 27.1 (L) pg      MCHC 31.6 (L) g/dL      RDW 19 (H) %      MPV 10.3 fL      Nucleated RBC 2 (H) /100 WBC     MRSA culture [[id number]] Collected:  04/29/14 0034    Specimen Information:  Body Fluid / Nasal Swab Updated:  04/30/14 0415    Narrative:      ORDER#: [id number]                                     ORDERED BY: Shirleen Schirmer  SOURCE: Nares                                        COLLECTED:  04/29/14 00:34  ANTIBIOTICS AT COLL.:                                RECEIVED :  04/29/14 05:16  Culture MRSA Surveillance                  FINAL       04/30/14 04:15  04/30/14   Negative for Methicillin Resistant Staph aureus from Nares and             Negative for Methicillin Resistant Staph aureus from Throat      MRSA culture [[id number]] Collected:  04/29/14 0034    Specimen Information:  Body Fluid / Throat - ASC Admission Updated:  04/30/14 0415    Narrative:      ORDER#: [id number]                                    ORDERED BY: Shirleen Schirmer  SOURCE: Throat  COLLECTED:  04/29/14 00:34  ANTIBIOTICS AT COLL.:                                RECEIVED :  04/29/14 05:16  Culture MRSA Surveillance                  FINAL       04/30/14 04:15  04/30/14   Negative for Methicillin Resistant Staph aureus from Nares and             Negative for Methicillin Resistant Staph aureus from Throat      Glucose Whole Blood - POCT [[id number]] Collected:  04/29/14 2347     POCT - Glucose Whole blood 79 mg/dL Updated:  13/08/65 7846    Glucose Whole Blood - POCT [[id number]] Collected:  04/29/14 2241     POCT - Glucose Whole blood 74 mg/dL Updated:  32/44/01 0272    Ammonia [[id number]] Collected:  04/29/14 1416    Specimen Information:  Blood Updated:  04/29/14 1436     Ammonia 42 umol/L     Glucose Whole Blood - POCT [[id number]]  (Abnormal) Collected:  04/29/14 1116     POCT - Glucose Whole blood 118 (H) mg/dL Updated:  75/64/33 2951              Signed by: Brunetta Genera, MD  Interventional Cardiology  [facility]  Office: [phone]  [facility] Spectral Link: [phone]

## 2014-04-30 NOTE — Progress Notes (Signed)
ICU PROGRESS NOTE    Date Time: 04/30/2014 9:06 AM  Patient Name: [name]  Attending Physician: Laveda Norman, MD    Hospital Problem List:   Patient Active Problem List   Diagnosis   . NSTEMI (non-ST elevated myocardial infarction)   . Hypertensive urgency   . PAD (peripheral artery disease)   . Arterial leg ulcer   . Chronic obstructive pulmonary disease, unspecified COPD type   . Ischemic cardiomyopathy   . CAD (coronary artery disease)   . Diabetes   . Chronic kidney disease   . Debilitated   . Complete heart block   . Junctional bradycardia   . Hyperkalemia   . Renal failure   . Bradycardia   . Acute on chronic renal failure   . Encephalopathy   . Azotemia       ICU Problem List: Congestive heart failure, NOS, Cardiac dysrhythmia, NOS and Coronary athersclerosis of native vessel, Pleural effusion and COPD with acute exacerbation, Altered mental status, Anemia and Acute kidney injury and Chronic Kidney Disease, Stage 4    Assessment:   64 yr old woman with HTN and CAD CABG presenting with Bradycardia, CHF, and AMS in the setting of acute on chronic renal failure.    Plan:   Daily HD. Monitor in ICU.    Subjective:   More alert and oriented than on admission. Healed sternotomy. HR in 70s with pacing pads on standby.    Medications:      Scheduled Meds: PRN Meds:      aspirin EC 81 mg Oral Daily   atorvastatin 10 mg Oral QHS   calcitRIOL 0.25 mcg Oral Daily   docusate sodium 100 mg Oral Daily   ertapenem 500 mg Intravenous Q24H   fluticasone-salmeterol 1 puff Inhalation BID   heparin (porcine) 5,000 Units Subcutaneous Q12H [facility]   hydrALAZINE 100 mg Oral Q8H [facility]   venlafaxine 25 mg Oral BID       Continuous Infusions:      acetaminophen 650 mg Q4H PRN   Or     acetaminophen 650 mg Q4H PRN   acetaminophen 650 mg Q4H PRN   atropine 0.5 mg PRN   bisacodyl 10 mg QD PRN   dextrose 25 mL PRN   dextrose 25 mL PRN   dextrose 25 mL PRN   fentaNYL 12.5 mcg Q4H PRN   glucagon (rDNA) 1 mg PRN   glucagon (rDNA) 1 mg  PRN   glucagon (rDNA) 1 mg PRN   insulin aspart 1-3 Units QHS PRN   insulin aspart 1-5 Units PRN   insulin aspart 1-5 Units TID AC PRN   naloxone 0.2 mg PRN   polyethylene glycol 17 g QD PRN               Physical Exam:     VITAL SIGNS   Temp:  [97 F (36.1 C)-98.6 F (37 C)] 98.3 F (36.8 C)  Heart Rate:  [56-97] 62  Resp Rate:  [12-34] 26  BP: (135-197)/(65-129) 197/78 mmHg  Blood Glucose:  Pulse ox:  Telemetry:       Intake/Output Summary (Last 24 hours) at 04/30/14 0906  Last data filed at 04/30/14 0600   Gross per 24 hour   Intake    550 ml   Output   2650 ml   Net  -2100 ml        Motor: 6- obeys commands bilateral  Verbal: 5 - alert/oriented  Eye: 4 - spontaneous  [place]  Coma Scale: 15    Invasive ICU Hemodynamics:                   Vent Settings:    IBW:      Lines/Drains/Airways:    Patient Lines/Drains/Airways Status    Active PICC Line / CVC Line / PIV Line / Drain / Airway / Intraosseous Line / Epidural Line / ART Line / Line / Wound / Pressure Ulcer / NG/OG Tube     Name:   Placement date:   Placement time:   Site:   Days:    Permacath/Temporary Catheter with Pigtail 04/28/14 Femoral vein catheter Right  04/28/14   2030      1    Peripheral IV 04/26/14 Right Wrist  04/26/14   0230   Wrist   4    Wound 04/22/14 Arterial Ankle Inner;Right White and yellow wound bed  04/22/14   2045   Ankle   7                General appearance - chronically ill appearing  Mental status - alert, oriented to person, place, and time  Eyes - pupils equal and reactive, extraocular eye movements intact  Ears - hearing grossly normal bilaterally  Nose - normal and patent, no erythema, discharge or polyps  Mouth - mucous membranes moist, pharynx normal without lesions  Neck - supple, no significant adenopathy  Lymphatics - no palpable lymphadenopathy, no hepatosplenomegaly  Chest - decreased air entry noted bases  Heart - bradycardia  Abdomen - soft, nontender, nondistended, no masses or organomegaly  Back exam - limited  range of motion  Neurological - alert, oriented, normal speech, no focal findings or movement disorder noted  Musculoskeletal - no joint tenderness, deformity or swelling  Extremities - peripheral pulses normal, no pedal edema, no clubbing or cyanosis, Capillary refill < 2 seconds  Skin - normal coloration and turgor, no rashes, no suspicious skin lesions noted    Labs:     Results     Procedure Component Value Units Date/Time    Glucose Whole Blood - POCT [[id number]] Collected:  04/30/14 0822     POCT - Glucose Whole blood 76 mg/dL Updated:  11/91/47 8295    Urine culture [[id number]] Collected:  04/29/14 0354    Specimen Information:  Urine / Urine, Catheterized, Foley Updated:  04/30/14 0640    Narrative:      ORDER#: [id number]                                    ORDERED BY: AL KHOURI, SAMI  SOURCE: Urine, Catheterized, Foley                   COLLECTED:  04/29/14 03:54  ANTIBIOTICS AT COLL.:                                RECEIVED :  04/29/14 08:08  Culture Urine                              PRELIM      04/30/14 06:40   +  04/30/14   >100,000 CFU/ML Gram negative rod               Identification and susceptibility to follow  Glucose Whole Blood - POCT [[id number]] Collected:  04/30/14 0550     POCT - Glucose Whole blood 94 mg/dL Updated:  47/82/95 6213    Basic Metabolic Panel [[id number]]  (Abnormal) Collected:  04/30/14 0420    Specimen Information:  Blood Updated:  04/30/14 0501     Glucose 74 mg/dL      BUN 62.9 (H) mg/dL      Creatinine 3.2 (H) mg/dL      CALCIUM 8.3 (L) mg/dL      Sodium 528 mEq/L      Potassium 3.7 mEq/L      Chloride 102 mEq/L      CO2 21 (L) mEq/L      Anion Gap 14.0     Magnesium [[id number]] Collected:  04/30/14 0420    Specimen Information:  Blood Updated:  04/30/14 0501     Magnesium 2.3 mg/dL     Phosphorus [[id number]] Collected:  04/30/14 0420    Specimen Information:  Blood Updated:  04/30/14 0501     Phosphorus 4.1 mg/dL     Hemolysis index [[id number]]  (Abnormal) Collected:   04/30/14 0420     Hemolysis Index 29 (H) Updated:  04/30/14 0501    GFR [[id number]] Collected:  04/30/14 0420     EGFR 17.6 Updated:  04/30/14 0501    Glucose Whole Blood - POCT [[id number]] Collected:  04/30/14 0413     POCT - Glucose Whole blood 71 mg/dL Updated:  60/63/01 6010    CBC without differential [[id number]]  (Abnormal) Collected:  04/30/14 0420    Specimen Information:  Blood / Blood Updated:  04/30/14 0434     WBC 5.96 x10 3/uL      Hgb 8.9 (L) g/dL      Hematocrit 20.2 (L) %      Platelets 205 x10 3/uL      RBC 3.29 (L) x10 6/uL      MCV 85.7 fL      MCH 27.1 (L) pg      MCHC 31.6 (L) g/dL      RDW 19 (H) %      MPV 10.3 fL      Nucleated RBC 2 (H) /100 WBC     MRSA culture [[id number]] Collected:  04/29/14 0034    Specimen Information:  Body Fluid / Nasal Swab Updated:  04/30/14 0415    Narrative:      ORDER#: [id number]                                    ORDERED BY: Shirleen Schirmer  SOURCE: Nares                                        COLLECTED:  04/29/14 00:34  ANTIBIOTICS AT COLL.:                                RECEIVED :  04/29/14 05:16  Culture MRSA Surveillance                  FINAL       04/30/14 04:15  04/30/14   Negative for Methicillin Resistant Staph aureus from Nares and             Negative for Methicillin Resistant Staph  aureus from Throat      MRSA culture [[id number]] Collected:  04/29/14 0034    Specimen Information:  Body Fluid / Throat - ASC Admission Updated:  04/30/14 0415    Narrative:      ORDER#: [id number]                                    ORDERED BY: Shirleen Schirmer  SOURCE: Throat                                       COLLECTED:  04/29/14 00:34  ANTIBIOTICS AT COLL.:                                RECEIVED :  04/29/14 05:16  Culture MRSA Surveillance                  FINAL       04/30/14 04:15  04/30/14   Negative for Methicillin Resistant Staph aureus from Nares and             Negative for Methicillin Resistant Staph aureus from Throat      Glucose Whole Blood - POCT [[id number]]  Collected:  04/29/14 2347     POCT - Glucose Whole blood 79 mg/dL Updated:  08/65/78 4696    Glucose Whole Blood - POCT [[id number]] Collected:  04/29/14 2241     POCT - Glucose Whole blood 74 mg/dL Updated:  44/01/02 7253    Ammonia [[id number]] Collected:  04/29/14 1416    Specimen Information:  Blood Updated:  04/29/14 1436     Ammonia 42 umol/L     Glucose Whole Blood - POCT [[id number]]  (Abnormal) Collected:  04/29/14 1116     POCT - Glucose Whole blood 118 (H) mg/dL Updated:  64/33/29 5188            Rads:     Radiology Results (24 Hour)     ** No results found for the last 24 hours. **        Critical Care  Performed by: Forest Becker T  Authorized by: Forest Becker T  Total critical care time: 45 minutes  Critical care time was exclusive of separately billable procedures and treating other patients.  Critical care was necessary to treat or prevent imminent or life-threatening deterioration of the following conditions: renal failure, respiratory failure, circulatory failure and cardiac failure.  Critical care was time spent personally by me on the following activities: development of treatment plan with patient or surrogate, discussions with consultants, discussions with primary provider, examination of patient, ordering and performing treatments and interventions, ordering and review of radiographic studies, re-evaluation of patient's condition, evaluation of patient's response to treatment, obtaining history from patient or surrogate, ordering and review of laboratory studies and review of old charts.        Signed by: Norton Pastel Annete Ayuso  Date/Time: 04/30/2014 9:06 AM

## 2014-04-30 NOTE — Progress Notes (Signed)
PROGRESS NOTE    Hospital Day: 2    Assessment:     AKI - likely ATN - oliguria.   CKD stage 4.   Bradycardia.   Metabolic acidosis.  Likely renal related  HTN - CKD - BP not controlled.   Sec. Hyperparathyroidism   Anemia of CKD. Hb low but stable     Plan:     No evidence of renal recovery at this time   We will do hemodialysis today.  Discussed with ICU RN and hemodialysis RN  Avoid beta blockers due to bradycardia.  Will start her on amlodipine for better blood pressure control.  Ultrafiltration of 2 L today.  This may also have his blood pressure control  Start on Aranesp for anemia once blood pressure is less than 1 60 mmHg systolic.  We will follow the patient closely    Subjective:     Pt seen today.   [name] is not feeling well.   No uremic symptoms     Review of Systems:   General:  no fever, no chills, no rigor, awake and alert, feeling better   HEENT: no neck pain, no throat pain  Endocrine: no fatigue   Respiratory: no cough, shortness of breath, or wheezing   Cardiovascular: no chest pain   Gastrointestinal: no abdominal pain,no N/V/D  Musculoskeletal: no edema  Neurological: no focal weakness  Dermatological: no rash, no ulcer    Physical Exam:     Filed Vitals:    04/30/14 1110   BP: 177/78   Pulse: 62   Temp:    Resp: 19   SpO2: 98%       Intake and Output Summary (Last 24 hours) at Date Time    Intake/Output Summary (Last 24 hours) at 04/30/14 1206  Last data filed at 04/30/14 0900   Gross per 24 hour   Intake    790 ml   Output   2650 ml   Net  -1860 ml         General: No acute distress.  HEENT: No pallor.  Neck: No jugular venous distension.  Chest: Clear to auscultation.   CVS: Regular rate and rhythm. Normal S1S2. No murmur or rub.  Abdomen: Nontender, non distended. Positive bowel sounds.  No lower extremity edema.  No rash on limbs.  No urine output      Scheduled Meds: Continuous Infusions:      aspirin EC 81 mg Oral Daily   atorvastatin 10 mg Oral QHS   calcitRIOL 0.25 mcg Oral Daily    docusate sodium 100 mg Oral Daily   ertapenem 500 mg Intravenous Q24H   fluticasone-salmeterol 1 puff Inhalation BID   heparin (porcine) 5,000 Units Subcutaneous Q12H [facility]   hydrALAZINE 100 mg Oral Q8H SCH   isosorbide mononitrate 30 mg Oral Daily   tamsulosin 0.4 mg Oral After breakfast   venlafaxine 25 mg Oral BID                Labs:       Recent Labs  Lab 04/30/14  0420 04/29/14  0354 04/25/14  1816   WBC 5.96 7.89 6.27   HEMOGLOBIN 8.9* 8.6* 9.6*   HEMATOCRIT 28.2* 26.4* 30.8*   PLATELETS 205 204 208       Recent Labs  Lab 04/30/14  0420 04/29/14  0354 04/28/14  0918  04/25/14  0414   SODIUM 137 138 135* More results in Results Review 135*   POTASSIUM 3.7 4.3 5.3* More  results in Results Review 4.9   CHLORIDE 102 104 107 More results in Results Review 109   CO2 21* 18* 16* More results in Results Review 18*   BUN 51.0* 81.0* 103* More results in Results Review 89*   CREATININE 3.2* 4.0* 4.8* More results in Results Review 3.4*   EGFR 17.6 13.6 11.0 More results in Results Review 16.4   GLUCOSE 74 118* 213* More results in Results Review 123*   CALCIUM 8.3* 8.5 8.8 More results in Results Review 8.6   PHOSPHORUS 4.1 4.5  --   --  4.9*   More results in Results Review = values in this interval not displayed.      URINE TYPE   Date Value Ref Range Status   04/25/2014 Clean Catch  Final     COLOR, UA   Date Value Ref Range Status   04/25/2014 Yellow Clear - Yellow Final     CLARITY, UA   Date Value Ref Range Status   04/25/2014 Sl Cloudy* Clear - Hazy Final     SPECIFIC GRAVITY UA   Date Value Ref Range Status   04/25/2014 1.012 1.001-1.035 Final     URINE PH   Date Value Ref Range Status   04/25/2014 7.0 5.0-8.0 Final     NITRITE, UA   Date Value Ref Range Status   04/25/2014 Negative Negative Final     KETONES UA   Date Value Ref Range Status   04/25/2014 Negative Negative Final     UROBILINOGEN, UA   Date Value Ref Range Status   04/25/2014 Negative 0.2  -  2.0 mg/dL Final     BILIRUBIN, UA   Date Value Ref  Range Status   04/25/2014 Negative Negative Final     BLOOD, UA   Date Value Ref Range Status   04/25/2014 Negative Negative Final     RBC, UA   Date Value Ref Range Status   04/25/2014 0 - 5 0 - 5 /hpf Final     WBC, UA   Date Value Ref Range Status   04/25/2014 26 - 50* 0 - 5 /hpf Final       Rads:     Radiology Results (24 Hour)     ** No results found for the last 24 hours. **        Radiological Procedure reviewed.          Cristal Ford, MD  04/30/2014

## 2014-05-01 ENCOUNTER — Inpatient Hospital Stay: Payer: Medicare Other

## 2014-05-01 LAB — ECG 12-LEAD
Atrial Rate: 65 {beats}/min
Atrial Rate: 53 {beats}/min
P Axis: 43 degrees
P Axis: 23 degrees
P-R Interval: 154 ms
P-R Interval: 120 ms
Q-T Interval: 432 ms
Q-T Interval: 488 ms
QRS Duration: 88 ms
QRS Duration: 88 ms
QTC Calculation (Bezet): 449 ms
QTC Calculation (Bezet): 457 ms
R Axis: 44 degrees
R Axis: 43 degrees
T Axis: 236 degrees
T Axis: 110 degrees
Ventricular Rate: 65 {beats}/min
Ventricular Rate: 53 {beats}/min

## 2014-05-01 LAB — GLUCOSE WHOLE BLOOD - POCT
Whole Blood Glucose POCT: 95 mg/dL (ref 70–100)
Whole Blood Glucose POCT: 134 mg/dL — ABNORMAL HIGH (ref 70–100)
Whole Blood Glucose POCT: 175 mg/dL — ABNORMAL HIGH (ref 70–100)
Whole Blood Glucose POCT: 145 mg/dL — ABNORMAL HIGH (ref 70–100)

## 2014-05-01 MED ORDER — HYDRALAZINE HCL 20 MG/ML IJ SOLN
10.0000 mg | Freq: Four times a day (QID) | INTRAMUSCULAR | Status: DC | PRN
Start: 2014-05-01 — End: 2014-05-16
  Administered 2014-05-03: 10 mg via INTRAVENOUS
  Filled 2014-05-01 (×3): qty 1

## 2014-05-01 MED ORDER — FUROSEMIDE 10 MG/ML IJ SOLN
40.0000 mg | Freq: Once | INTRAMUSCULAR | Status: AC
Start: 2014-05-01 — End: 2014-05-01
  Administered 2014-05-01: 40 mg via INTRAVENOUS
  Filled 2014-05-01: qty 4

## 2014-05-01 MED ORDER — ISOSORBIDE MONONITRATE ER 60 MG PO TB24
60.0000 mg | ORAL_TABLET | Freq: Every day | ORAL | Status: DC
Start: 2014-05-01 — End: 2014-05-16
  Administered 2014-05-01 – 2014-05-16 (×14): 60 mg via ORAL
  Filled 2014-05-01 (×6): qty 1
  Filled 2014-05-01: qty 2
  Filled 2014-05-01: qty 1
  Filled 2014-05-01: qty 2
  Filled 2014-05-01 (×5): qty 1

## 2014-05-01 MED ORDER — DARBEPOETIN ALFA 40 MCG/0.4ML IJ SOSY
40.0000 ug | PREFILLED_SYRINGE | INTRAMUSCULAR | Status: DC
Start: 2014-05-01 — End: 2014-05-16
  Administered 2014-05-01 – 2014-05-15 (×3): 40 ug via SUBCUTANEOUS
  Filled 2014-05-01 (×3): qty 0.4

## 2014-05-01 NOTE — Progress Notes (Signed)
PROGRESS NOTE    Date Time: 05/01/2014 10:32 AM  Patient Name: [name], [name]      Subjective:   Bun 51  crt 3.2  Wbc 5.9  hct 28    Temp 98  SBP 189         Review of Systems:   A comprehensive review of systems was: General ROS: negative for - chills, fever or night sweats  Psychological ROS: negative for - anxiety, depression, disorientation, hallucinations or suicidal ideation  ENT ROS: negative for - headaches, nasal congestion, sinus pain or visual changes  Allergy and Immunology ROS: negative for - itchy/watery eyes, nasal congestion or postnasal drip  Hematological and Lymphatic ROS: negative for - bleeding problems, bruising, pallor or weight loss  Endocrine ROS: negative for - malaise/lethargy or polydipsia/polyuria  Respiratory ROS: negative for - cough, orthopnea, shortness of breath or wheezing  Cardiovascular ROS: negative for - chest pain, dyspnea on exertion, orthopnea or shortness of breath  Gastrointestinal ROS: negative for - abdominal pain, constipation, diarrhea or nausea/vomiting  Genito-Urinary ROS: negative for - change in urinary stream, dysuria, hematuria or nocturia  Musculoskeletal ROS: negative for - joint pain, joint stiffness or joint swelling  Neurological ROS: negative for - confusion, dizziness, headaches, memory loss or speech problems  Dermatological ROS: negative for pruritus, rash and skin lesion changes    Physical Exam:     Filed Vitals:    05/01/14 1012   BP:    Pulse:    Temp:    Resp:    SpO2: 93%       General appearance -awake  Mental status -awake talking  Eyes - pupils equal and reactive, extraocular eye movements intact  Nose - normal and patent, no erythema, discharge or polyps  Mouth - mucous membranes moist, pharynx normal without lesions  Neck - supple, no significant adenopathy  Lymphatics - no palpable lymphadenopathy, no hepatosplenomegaly  Chest - clear to auscultation, no wheezes, rales or rhonchi, symmetric air entry  Heart - normal rate, regular rhythm, normal  S1, S2, no murmurs, rubs, clicks or gallops  Abdomen - soft, nontender, nondistended, no masses or organomegaly  Neurological - awake   Musculoskeletal - no joint tenderness, deformity or swelling  Extremities - peripheral pulses normal, no pedal edema, no clubbing or cyanosis  Skin - normal coloration and turgor, no rashes, no suspicious skin lesions noted    Medications:     Current Facility-Administered Medications   Medication Dose Route Frequency   . amLODIPine  10 mg Oral Daily   . aspirin EC  81 mg Oral Daily   . atorvastatin  10 mg Oral QHS   . calcitRIOL  0.25 mcg Oral Daily   . docusate sodium  100 mg Oral Daily   . ertapenem  500 mg Intravenous Q24H   . fluticasone-salmeterol  1 puff Inhalation BID   . heparin (porcine)  5,000 Units Subcutaneous Q12H [facility]   . hydrALAZINE  100 mg Oral Q8H SCH   . isosorbide mononitrate  60 mg Oral Daily   . tamsulosin  0.4 mg Oral After breakfast   . venlafaxine  25 mg Oral BID       Intake and Output Summary (Last 24 hours) at Date Time    Intake/Output Summary (Last 24 hours) at 05/01/14 1032  Last data filed at 04/30/14 2300   Gross per 24 hour   Intake    280 ml   Output   2100 ml   Net  -  1820 ml           Labs:       Recent Labs  Lab 04/30/14  0420 04/29/14  0354 04/28/14  0918   SODIUM 137 138 135*   POTASSIUM 3.7 4.3 5.3*   CHLORIDE 102 104 107   CO2 21* 18* 16*   BUN 51.0* 81.0* 103*   CREATININE 3.2* 4.0* 4.8*   CALCIUM 8.3* 8.5 8.8   GLUCOSE 74 118* 213*       Recent Labs  Lab 04/30/14  0420   WBC 5.96   HEMOGLOBIN 8.9*   HEMATOCRIT 28.2*   PLATELETS 205               Rads:     Radiology Results (24 Hour)     Procedure Component Value Units Date/Time    XR Chest AP Portable [[id number]] Resulted:  05/01/14 0557    Order Status:  Sent Updated:  05/01/14 0845            Assessment:     Patient Active Problem List   Diagnosis   . NSTEMI (non-ST elevated myocardial infarction)   . Hypertensive urgency   . PAD (peripheral artery disease)   . Arterial leg ulcer   .  Chronic obstructive pulmonary disease, unspecified COPD type   . Ischemic cardiomyopathy   . CAD (coronary artery disease)   . Diabetes   . Chronic kidney disease   . Debilitated   . Complete heart block   . Junctional bradycardia   . Hyperkalemia   . Renal failure   . Bradycardia   . Acute on chronic renal failure   . Encephalopathy   . Azotemia       Plan:   Bradycardia  Junctional rhythm  Off BB     Hx CAD    Hx cardiomyopathy  Hx aortic stenosis     Acute on chronic RF   On HD    Encephalopathy  metablolic   imoproved       DM SSI monitor glucose      R/O sepsis  Hypothermic resolved  Evaluated by ID  UC bld cx on invanz       Hypertension on hydralazine and norvasc  Signed by: Laveda Norman, MD  05/01/2014  10:32 AM

## 2014-05-01 NOTE — Plan of Care (Signed)
Problem: Renal Failure  Goal: Fluid and electrolyte balance are achieved/maintained  Outcome: Progressing  Plan for HD again Mon or Tues. Pt otherwise stable.

## 2014-05-01 NOTE — Treatment Plan (Signed)
05/01/2014/8:59 AM    Admit Dx: symptomatic bradycardia, ARF    Pathway:    Sepsis Screen: negative    Pain:  No            Sedation:  No    DVT Prophylaxis: Yes    GI Prophylaxis: Yes    Integumentary: Wound to RLE    Diet: Consistent carb    BGL < 180: Yes        Glucose Monitoring: Yes    Antibiotic Day #: 2    Restraint Need Reviewed: No    Central Line Necessity Reviewed: Yes    PT/OT: Yes     Early Mobilization Category: 2    Proxy/Decision Maker Identified: Yes    Palliative Care: No    WRTC Notification Triggers: No                                    Notified: No  Plan: Monitor BP, plan to transfer to floor.     Participants:     Physician: Smirniotopoulos                             Nursing: Yes                             Pharmacy: No                             Nutrition: No                             Case Management: No                             Respiratory: No                             Palliative Care: No                             PT/OT: No

## 2014-05-01 NOTE — Treatment Plan (Signed)
Severe Sepsis Screen  Date: 05/01/2014 Time: 9:11 AM  Nurse Signature: Landry Mellow. Infection:    Does your patient have ONE or more of the following infection criteria?     []  Documented Infection - Does the patient have positive culture results (from blood,        sputum, urine, etc)?   [x]  Anti-Infective Therapy - Is the patient receiving antibiotic, antifungal, or other                anti-infective therapy?   []  Pneumonia - Is there documentation of pneumonia (X Ray, etc)?   []  WBC's - Have WBC's been found in normally sterile fluid (urine, CSF, etc.)?   []  Perforated Viscus -Does the patient have a perforated hollow organ (bowel)?    A.  Did you check any of the boxes above?    []  No  If No, Stop Here and Sepsis Screen Negative.               [x]  Yes, continue to section B      B. SIRS:     Does your patient have TWO or more of the following SIRS criteria (ensure vital signs & temperature are within 1 hour of this screening)?    []  Temperature - Is the patient's temperature: Temp: 97.8 F (36.6 C) (05/01/14 0800)   - Greater than or equal to 38.3 degrees C (greater than 100.9 degrees F)?   - Less than or equal to 36 degrees C (less than or equal to 96.8 degrees F)?    []  Heart Rate: Heart Rate: 79 (05/01/14 0800)   - Is the patient's heart rate greater than or equal to 90 bpm?    []  Respiratory: Resp Rate: 12 (05/01/14 0800)   - Is the patient's respiratory rate greater than or equal to 20?    []  WBC Count - Is the patient's WBC count:   Recent Labs  Lab 04/30/14  0420   WBC 5.96      - Greater than or equal to 12,000/mm3 OR   - Less than or equal to 4,000/mm3 OR    - Are there greater than 10% immature neutrophils (bands)?    []  Glucose >140 without diabetes?   Recent Labs  Lab 04/30/14  0420   GLUCOSE 74       []  Significant edema?    B.  Did you check two or more of the boxes above?     [x]  No, Stop Here and Sepsis Screen Negative   []  Yes, continue to section C    C.  Acute Organ Dysfunction      Does your patient have ONE or more of the following organ dysfunction? (May need to wait for lab results for assessment - see below) Organ dysfunction must be a result of the sepsis not chronic conditions.    []  Cardiovascular - Does the patient have a: BP: 189/80 mmHg (05/01/14 0800)   -systolic Blood pressure less than or equal to 90 mmHg OR   -systolic blood pressure has dropped 40 mmHg or more from baseline OR   -mean arterial pressure less than or equal to 70 mmHg (for at least one hour   despite fluid resuscitation OR require vasopressor support?  []  Respiratory - Does the patient have new hypoxia defined by any of the following"   -A sustained increase in oxygen requirements by at least 2L/min on NC or 28%    FiO2 within  the last 24 hrs OR   -A persistent decrease in oxygen saturation of greater than or equal to 5% lasting   at least four or more hours and occurring within the last 24 hours  []  Renal - Does the patient have:   -low urine output (e.g. Less than 0.47mL/kg/HR for one hour despite adequate fluid    resuscitation, OR   -Increased creatinine (greater than 50% increase from baseline) OR   -require acute dialysis?  []  Hematologic - Does the patient have a:   -Low platelet count (less than 100,000 mm3)   Recent Labs  Lab 04/30/14  0420   PLATELETS 205   OR   -INR/aPTT greater than upper limit of normal?   or                    []  Metabolic - Does the patient have a high lactate (plasma lactate greater than or equal to 2.4 mMol/L)?   Recent Labs  Lab 04/28/14  1647   LACTIC ACID 1.3       []  Hepatic - Are the patient's liver enzymes elevated (ALT greater than 72 IU/L or Total      Bilirubin greater than 2 MG/dL)?      []  CNS - Does the patient have altered consciousness or reduced Glasgow Coma      Scale?    C.  Did you check any of the boxes above?     []  No, Sepsis Screen Negative   []  YES:  A) Infection + B) SIRS + C) Organ Dysfunction = Positive Screen for Severe Sepsis     Notify MD and  document in Complex Assessment under provider notification   - Name of physician notified:                                         - Date/Time Notifiied:                                       Document actions: Following must be completed within 1 hour of positive sepsis screen   []  Lactate drawn (if initial lactate > , repeat lactate in 2 hours for goal decrease 10-20%)   []  Blood Cultures obtained (prior to antibiotic administration; if not done within the last 48 hours)   []  Antibiotics initiated or modified    []   IV Fluid administered 0.9% NS _______ mLs given (Initial Bolus of 30 ml/kg if SBP < 90 or MAP < 65 or lactate greater than 4 mmol/dl)  Nursing Comments?:     _________________________________________________________________    Patients meeting the following criteria are excluded from screening (check if applicable):   []  [place] hypothermia protocol   []  Comfort Care orders   []  Surgery- No screening for 24 hours after surgery (48 hours after CV surgery)       -If Yes. Date of surgery:______________________   []  Admitted with sepsis and until 72 hours after admission with documented sepsis (RESUME SEPSIS SCREEN AFTER 72 hour window!!!)      -If Yes, Date of Documented Sepsis:______________________   []  Positive screen AND Completed sepsis bundle during previous 24 hours       -If Yes, Date/Time of positive severe sepsis screen:_______________________

## 2014-05-01 NOTE — Progress Notes (Signed)
PROGRESS NOTE  [facility]  [address], [address]  [place], [place] [address]  [phone]  Fax [phone]    Date Time: 05/01/2014 9:56 AM  Patient Name: [name], [name]      Assessment:     Bradycardia - intermittent junctional   HTN ( uncontrolled)  Coronary artery disease   - CABG 2015  CKD  DM    Problem List:     Active Hospital Problems    Diagnosis   . Renal failure   . Bradycardia   . Acute on chronic renal failure   . Encephalopathy   . Azotemia   . CAD (coronary artery disease)   . Chronic obstructive pulmonary disease, unspecified COPD type        Plan:   Continue ASA  Continue hydralazine  Increase imdur  Continue to avoid AV nodal blocking agents  norvasc added    Subjective:   No chest pain    Medications:     Current Facility-Administered Medications   Medication Dose Route Frequency Last Rate Last Dose   . acetaminophen (TYLENOL) tablet 650 mg  650 mg Oral Q4H PRN   650 mg at 04/30/14 2300    Or   . acetaminophen (TYLENOL) suppository 650 mg  650 mg Rectal Q4H PRN       . acetaminophen (TYLENOL) tablet 650 mg  650 mg Oral Q4H PRN   650 mg at 04/29/14 0420   . amLODIPine (NORVASC) tablet 10 mg  10 mg Oral Daily   10 mg at 04/30/14 2256   . aspirin EC tablet 81 mg  81 mg Oral Daily   81 mg at 04/30/14 0918   . atorvastatin (LIPITOR) tablet 10 mg  10 mg Oral QHS   10 mg at 04/30/14 2257   . atropine injection 0.5 mg  0.5 mg Intravenous PRN       . bisacodyl (DULCOLAX) suppository 10 mg  10 mg Rectal QD PRN       . calcitRIOL (ROCALTROL) capsule 0.25 mcg  0.25 mcg Oral Daily   0.25 mcg at 04/30/14 0918   . dextrose 50 % bolus 25 mL  25 mL Intravenous PRN       . dextrose 50 % bolus 25 mL  25 mL Intravenous PRN       . dextrose 50 % bolus 25 mL  25 mL Intravenous PRN       . docusate sodium (COLACE) capsule 100 mg  100 mg Oral Daily   100 mg at 04/30/14 0918   . ertapenem (INVanz) 500 mg in sodium chloride 0.9 % 100 mL IVPB  500 mg Intravenous Q24H 200 mL/hr at 04/30/14 2300  500 mg at 04/30/14 2300   . fentaNYL (SUBLIMAZE) injection 12.5 mcg  12.5 mcg Intravenous Q4H PRN   12.5 mcg at 04/30/14 2120   . fluticasone-salmeterol (ADVAIR DISKUS) 250-50 MCG/DOSE 1 puff  1 puff Inhalation BID   1 puff at 04/30/14 2008   . furosemide (LASIX) injection 40 mg  40 mg Intravenous Once       . glucagon (rDNA) (GLUCAGEN) injection 1 mg  1 mg Intramuscular PRN       . glucagon (rDNA) (GLUCAGEN) injection 1 mg  1 mg Intramuscular PRN       . glucagon (rDNA) (GLUCAGEN) injection 1 mg  1 mg Intramuscular PRN       . heparin (porcine) injection 5,000 Units  5,000 Units Subcutaneous Q12H SCH   5,000  Units at 04/30/14 2259   . hydrALAZINE (APRESOLINE) injection 10 mg  10 mg Intravenous Q6H PRN       . hydrALAZINE (APRESOLINE) tablet 100 mg  100 mg Oral Q8H SCH   100 mg at 05/01/14 0200   . insulin aspart (NovoLOG) injection 1-3 Units  1-3 Units Subcutaneous QHS PRN       . insulin aspart (NovoLOG) injection 1-5 Units  1-5 Units Subcutaneous PRN       . insulin aspart (NovoLOG) injection 1-5 Units  1-5 Units Subcutaneous TID AC PRN       . isosorbide mononitrate (IMDUR) 24 hr tablet 60 mg  60 mg Oral Daily       . naloxone (NARCAN) injection 0.2 mg  0.2 mg Intravenous PRN       . polyethylene glycol (MIRALAX) packet 17 g  17 g Oral QD PRN       . tamsulosin (FLOMAX) capsule 0.4 mg  0.4 mg Oral After breakfast   0.4 mg at 05/01/14 0839   . venlafaxine [facility]) tablet 25 mg  25 mg Oral BID   25 mg at 04/30/14 1747          Physical Exam:     Filed Vitals:    05/01/14 0800   BP: 189/80   Pulse: 79   Temp: 97.8 F (36.6 C)   Resp: 12   SpO2: 91%       Intake and Output Summary (Last 24 hours) at Date Time    Intake/Output Summary (Last 24 hours) at 05/01/14 0956  Last data filed at 04/30/14 2300   Gross per 24 hour   Intake    280 ml   Output   2100 ml   Net  -1820 ml       Tele - sr in 80s  CV:rr  Lungs:cta anteriorly  Abd:s  Ext:no edema    Labs:     Results     Procedure Component Value Units Date/Time     Urine culture [[id number]] Collected:  04/29/14 0354    Specimen Information:  Urine / Urine, Catheterized, Foley Updated:  05/01/14 0902    Narrative:      ORDER#: [id number]                                    ORDERED BY: AL KHOURI, SAMI  SOURCE: Urine, Catheterized, Foley                   COLLECTED:  04/29/14 03:54  ANTIBIOTICS AT COLL.:                                RECEIVED :  04/29/14 08:08  Culture Urine                              FINAL       05/01/14 09:02   +  05/01/14   >100,000 CFU/ML MDR Proteus mirabilis               Ceftriaxone Resistant Enterobacteriaceae.             This multidrug resistant (MDR) organism may not respond             optimally to B-lactam antibiotics (excluding carbapenems).  [facility] Antimicrobial [facility]committee June 2015    _____________________________________________________________________________                                MDR P.mirabilis   ANTIBIOTICS                     MIC  INTRP      _____________________________________________________________________________  Amikacin                        <=8    S        Amoxicillin/CA                  8/4    S        Ampicillin                      >16    R        Aztreonam                       <=2    R        Cefazolin                       >16    R        Cefepime                        16     R        Ceftazidime                     <=2    R        Ceftriaxone                     >32    R        Ciprofloxacin                   >2     R        Ertapenem                     <=0.25   S        Gentamicin                      >8     R        Levofloxacin                    >4     R        Nitrofurantoin                  >64    R  D1    Piperacillin/Tazobactam        <=2/4   S        Tetracycline                    >8     R        Tobramycin                      >8     R        Trimethoprim/Sulfamethoxazole  >2/38  R          -----DRUG COMMENTS----------    D1:  Nitrofurantoin should only be used for the treatment  of         uncomplicated cystitis.         [place] [facility] Antimicrobial Subcommittee June 2015  _____________________________________________________________________________            S=SUSCEPTIBLE     I=INTERMEDIATE     R=RESISTANT                            N/S=NON-SUSCEPTIBLE  _____________________________________________________________________________      Glucose Whole Blood - POCT [[id number]] Collected:  05/01/14 0735     POCT - Glucose Whole blood 95 mg/dL Updated:  78/46/96 2952    Glucose Whole Blood - POCT [[id number]]  (Abnormal) Collected:  04/30/14 2040     POCT - Glucose Whole blood 164 (H) mg/dL Updated:  02/72/53 6644    Troponin I [[id number]] Collected:  04/30/14 1748    Specimen Information:  Blood Updated:  04/30/14 1846     Troponin I 0.07 ng/mL     Glucose Whole Blood - POCT [[id number]]  (Abnormal) Collected:  04/30/14 1150     POCT - Glucose Whole blood 108 (H) mg/dL Updated:  29/51/88 4166    Troponin I [[id number]]  (Abnormal) Collected:  04/30/14 1154    Specimen Information:  Blood Updated:  04/30/14 1237     Troponin I 0.10 (H) ng/mL               Signed by: Brunetta Genera, MD  Interventional Cardiology  [facility]  Office: [phone]  [place] [employer] Link: [phone]

## 2014-05-01 NOTE — Progress Notes (Signed)
ICU PROGRESS NOTE    Date Time: 05/01/2014 8:58 AM  Patient Name: [name]  Attending Physician: Laveda Norman, MD    Hospital Problem List:   Patient Active Problem List   Diagnosis   . NSTEMI (non-ST elevated myocardial infarction)   . Hypertensive urgency   . PAD (peripheral artery disease)   . Arterial leg ulcer   . Chronic obstructive pulmonary disease, unspecified COPD type   . Ischemic cardiomyopathy   . CAD (coronary artery disease)   . Diabetes   . Chronic kidney disease   . Debilitated   . Complete heart block   . Junctional bradycardia   . Hyperkalemia   . Renal failure   . Bradycardia   . Acute on chronic renal failure   . Encephalopathy   . Azotemia       ICU Problem List: Congestive heart failure, NOS, Cardiac dysrhythmia, NOS and Coronary athersclerosis of native vessel, Pleural effusion and COPD with acute exacerbation, Altered mental status, Anemia and Acute kidney injury and Chronic Kidney Disease, Stage 4    Assessment:   64 yr old woman with HTN and CAD CABG presenting with Bradycardia, CHF, and AMS in the setting of acute on chronic renal failure.    Plan:   Transfer to IMCU    Subjective:   Alert and oriented. Healed sternotomy. HR in 80s. Voided about 400 mL with Flomax. GFR improved slightly.    Medications:      Scheduled Meds: PRN Meds:        amLODIPine 10 mg Oral Daily   aspirin EC 81 mg Oral Daily   atorvastatin 10 mg Oral QHS   calcitRIOL 0.25 mcg Oral Daily   carvedilol 25 mg Oral Q12H SCH   docusate sodium 100 mg Oral Daily   ertapenem 500 mg Intravenous Q24H   fluticasone-salmeterol 1 puff Inhalation BID   furosemide 40 mg Intravenous Once   heparin (porcine) 5,000 Units Subcutaneous Q12H SCH   hydrALAZINE 100 mg Oral Q8H SCH   isosorbide mononitrate 30 mg Oral Daily   tamsulosin 0.4 mg Oral After breakfast   venlafaxine 25 mg Oral BID       Continuous Infusions:      acetaminophen 650 mg Q4H PRN   Or     acetaminophen 650 mg Q4H PRN   acetaminophen 650 mg Q4H PRN    atropine 0.5 mg PRN   bisacodyl 10 mg QD PRN   dextrose 25 mL PRN   dextrose 25 mL PRN   dextrose 25 mL PRN   fentaNYL 12.5 mcg Q4H PRN   glucagon (rDNA) 1 mg PRN   glucagon (rDNA) 1 mg PRN   glucagon (rDNA) 1 mg PRN   hydrALAZINE 10 mg Q6H PRN   insulin aspart 1-3 Units QHS PRN   insulin aspart 1-5 Units PRN   insulin aspart 1-5 Units TID AC PRN   naloxone 0.2 mg PRN   polyethylene glycol 17 g QD PRN               Physical Exam:     VITAL SIGNS   Temp:  [97.8 F (36.6 C)-98.4 F (36.9 C)] 97.8 F (36.6 C)  Heart Rate:  [54-99] 79  Resp Rate:  [10-26] 12  BP: (151-202)/(70-96) 189/80 mmHg  Blood Glucose:  Pulse ox:  Telemetry:       Intake/Output Summary (Last 24 hours) at 05/01/14 0858  Last data filed at 04/30/14 2300   Gross per 24 hour  Intake    520 ml   Output   2100 ml   Net  -1580 ml        Motor: 6- obeys commands bilateral  Verbal: 5 - alert/oriented  Eye: 4 - spontaneous  Glasgow Coma Scale: 15    Invasive ICU Hemodynamics:                   Vent Settings:    IBW:      Lines/Drains/Airways:    Patient Lines/Drains/Airways Status    Active PICC Line / CVC Line / PIV Line / Drain / Airway / Intraosseous Line / Epidural Line / ART Line / Line / Wound / Pressure Ulcer / NG/OG Tube     Name:   Placement date:   Placement time:   Site:   Days:    Permacath/Temporary Catheter with Pigtail 04/28/14 Femoral vein catheter Right  04/28/14   2030      1    Peripheral IV 04/26/14 Right Wrist  04/26/14   0230   Wrist   4    Wound 04/22/14 Arterial Ankle Inner;Right White and yellow wound bed  04/22/14   2045   Ankle   7                General appearance - chronically ill appearing  Mental status - alert, oriented to person, place, and time  Eyes - pupils equal and reactive, extraocular eye movements intact  Ears - hearing grossly normal bilaterally  Nose - normal and patent, no erythema, discharge or polyps  Mouth - mucous membranes moist, pharynx normal without lesions  Neck - supple, no significant  adenopathy  Lymphatics - no palpable lymphadenopathy, no hepatosplenomegaly  Chest - decreased air entry noted bases  Heart - normal rate and regular rhythm  Abdomen - soft, nontender, nondistended, no masses or organomegaly  Back exam - limited range of motion  Neurological - alert, oriented, normal speech, no focal findings or movement disorder noted  Musculoskeletal - no joint tenderness, deformity or swelling  Extremities - peripheral pulses normal, no pedal edema, no clubbing or cyanosis, Capillary refill < 2 seconds  Skin - normal coloration and turgor, no rashes, no suspicious skin lesions noted    Labs:     Results     Procedure Component Value Units Date/Time    Glucose Whole Blood - POCT [[id number]] Collected:  05/01/14 0735     POCT - Glucose Whole blood 95 mg/dL Updated:  40/98/11 9147    Glucose Whole Blood - POCT [[id number]]  (Abnormal) Collected:  04/30/14 2040     POCT - Glucose Whole blood 164 (H) mg/dL Updated:  86/57/84 6962    Troponin I [[id number]] Collected:  04/30/14 1748    Specimen Information:  Blood Updated:  04/30/14 1846     Troponin I 0.07 ng/mL     Glucose Whole Blood - POCT [[id number]]  (Abnormal) Collected:  04/30/14 1150     POCT - Glucose Whole blood 108 (H) mg/dL Updated:  66/44/03 4742    Troponin I [[id number]]  (Abnormal) Collected:  04/30/14 1154    Specimen Information:  Blood Updated:  04/30/14 1237     Troponin I 0.10 (H) ng/mL             Rads:     Radiology Results (24 Hour)     Procedure Component Value Units Date/Time    XR Chest AP Portable [[id number]] Resulted:  05/01/14 0557    Order  Status:  Sent Updated:  05/01/14 0845        Critical Care  Performed by: Forest Becker T  Authorized by: Forest Becker T  Total critical care time: 30 minutes  Critical care time was exclusive of separately billable procedures and treating other patients.  Critical care was necessary to treat or prevent imminent or life-threatening deterioration of the following  conditions: cardiac failure, renal failure, sepsis and circulatory failure.  Critical care was time spent personally by me on the following activities: re-evaluation of patient's condition, development of treatment plan with patient or surrogate, discussions with consultants, examination of patient, ordering and performing treatments and interventions, ordering and review of radiographic studies, ordering and review of laboratory studies and evaluation of patient's response to treatment.        Signed by: Norton Pastel Kae Lauman  Date/Time: 05/01/2014 8:58 AM

## 2014-05-01 NOTE — Treatment Plan (Signed)
Severe Sepsis Screen  Date: 05/01/2014 Time: 11:32 PM  Nurse Signature: Temperance Kelemen    A. Infection:    Does your patient have ONE or more of the following infection criteria?     []  Documented Infection - Does the patient have positive culture results (from blood,        sputum, urine, etc)?   [x]  Anti-Infective Therapy - Is the patient receiving antibiotic, antifungal, or other                anti-infective therapy?   []  Pneumonia - Is there documentation of pneumonia (X Ray, etc)?   []  WBC's - Have WBC's been found in normally sterile fluid (urine, CSF, etc.)?   []  Perforated Viscus -Does the patient have a perforated hollow organ (bowel)?    A.  Did you check any of the boxes above?    []  No  If No, Stop Here and Sepsis Screen Negative.               [x]  Yes, continue to section B      B. SIRS:     Does your patient have TWO or more of the following SIRS criteria (ensure vital signs & temperature are within 1 hour of this screening)?    []  Temperature - Is the patient's temperature: Temp: 98.2 F (36.8 C) (05/01/14 2000)   - Greater than or equal to 38.3 degrees C (greater than 100.9 degrees F)?   - Less than or equal to 36 degrees C (less than or equal to 96.8 degrees F)?    []  Heart Rate: Heart Rate: 88 (05/01/14 2300)   - Is the patient's heart rate greater than or equal to 90 bpm?    []  Respiratory: Resp Rate: 15 (05/01/14 2300)   - Is the patient's respiratory rate greater than or equal to 20?    []  WBC Count - Is the patient's WBC count:     Recent Labs  Lab 04/30/14  0420   WBC 5.96      - Greater than or equal to 12,000/mm3 OR   - Less than or equal to 4,000/mm3 OR    - Are there greater than 10% immature neutrophils (bands)?    []  Glucose >140 without diabetes?     Recent Labs  Lab 04/30/14  0420   GLUCOSE 74       []  Significant edema?    B.  Did you check two or more of the boxes above?     [x]  No, Stop Here and Sepsis Screen Negative   []  Yes, continue to section C    C.  Acute Organ  Dysfunction     Does your patient have ONE or more of the following organ dysfunction? (May need to wait for lab results for assessment - see below) Organ dysfunction must be a result of the sepsis not chronic conditions.    []  Cardiovascular - Does the patient have a: BP: 172/74 mmHg (05/01/14 2300)   -systolic Blood pressure less than or equal to 90 mmHg OR   -systolic blood pressure has dropped 40 mmHg or more from baseline OR   -mean arterial pressure less than or equal to 70 mmHg (for at least one hour   despite fluid resuscitation OR require vasopressor support?  []  Respiratory - Does the patient have new hypoxia defined by any of the following"   -A sustained increase in oxygen requirements by at least 2L/min on NC or 28%  FiO2 within the last 24 hrs OR   -A persistent decrease in oxygen saturation of greater than or equal to 5% lasting   at least four or more hours and occurring within the last 24 hours  []  Renal - Does the patient have:   -low urine output (e.g. Less than 0.50mL/kg/HR for one hour despite adequate fluid    resuscitation, OR   -Increased creatinine (greater than 50% increase from baseline) OR   -require acute dialysis?  []  Hematologic - Does the patient have a:   -Low platelet count (less than 100,000 mm3)     Recent Labs  Lab 04/30/14  0420   PLATELETS 205   OR   -INR/aPTT greater than upper limit of normal?   or                    []  Metabolic - Does the patient have a high lactate (plasma lactate greater than or equal to 2.4 mMol/L)?     Recent Labs  Lab 04/28/14  1647   LACTIC ACID 1.3       []  Hepatic - Are the patient's liver enzymes elevated (ALT greater than 72 IU/L or Total      Bilirubin greater than 2 MG/dL)?      []  CNS - Does the patient have altered consciousness or reduced Glasgow Coma      Scale?    C.  Did you check any of the boxes above?     []  No, Sepsis Screen Negative   []  YES:  A) Infection + B) SIRS + C) Organ Dysfunction = Positive Screen for Severe  Sepsis     Notify MD and document in Complex Assessment under provider notification   - Name of physician notified:                                         - Date/Time Notifiied:                                       Document actions: Following must be completed within 1 hour of positive sepsis screen   []  Lactate drawn (if initial lactate > , repeat lactate in 2 hours for goal decrease 10-20%)   []  Blood Cultures obtained (prior to antibiotic administration; if not done within the last 48 hours)   []  Antibiotics initiated or modified    []   IV Fluid administered 0.9% NS _______ mLs given (Initial Bolus of 30 ml/kg if SBP < 90 or MAP < 65 or lactate greater than 4 mmol/dl)  Nursing Comments?:     _________________________________________________________________    Patients meeting the following criteria are excluded from screening (check if applicable):   []  [place] hypothermia protocol   []  Comfort Care orders   []  Surgery- No screening for 24 hours after surgery (48 hours after CV surgery)       -If Yes. Date of surgery:______________________   []  Admitted with sepsis and until 72 hours after admission with documented sepsis (RESUME SEPSIS SCREEN AFTER 72 hour window!!!)      -If Yes, Date of Documented Sepsis:______________________   []  Positive screen AND Completed sepsis bundle during previous 24 hours       -If Yes, Date/Time of positive severe sepsis screen:_______________________

## 2014-05-01 NOTE — Progress Notes (Signed)
INFECTIOUS DISEASES PROGRESS NOTE    Date Time: 05/01/2014 4:00 PM  Patient Name: [name], [name]  Patient status.Inpatient  Hospital Day: 3    Assessment and Plan:   1.  UTI  -Cx =MDR Proteus  2. No fever or leucocytosis      PLAN: 1. Ertapenem  Subjective:   Awake and oriented      Review of Systems:   Review of Systems - General ROS: negative    Antibiotics:   Day 4  Other medications reviewed in EPIC.  Central Access:   Fem line    Physical Exam:   Afeb  Filed Vitals:    05/01/14 1500   BP: 175/78   Pulse: 82   Temp:    Resp: 30   SpO2: 100%       Chest - clear to auscultation, no wheezes, rales or rhonchi, symmetric air entry  Heart - normal rate, regular rhythm, normal S1, S2, no murmurs, rubs, clicks or gallops  Abdomen - soft, nontender, nondistended, no masses or organomegaly    Labs:     Microbiology Results     Procedure Component Value Units Date/Time    MRSA culture [[id number]] Collected:  04/29/14 0034    Specimen Information:  Body Fluid / Nares and Throat Updated:  04/29/14 0516    MRSA culture [[id number]] Collected:  04/29/14 0034    Specimen Information:  Body Fluid / Nares and Throat Updated:  04/29/14 0516    Urine culture [[id number]] Collected:  04/29/14 0354    Specimen Information:  Urine / Urine, Catheterized, Foley Updated:  04/29/14 0808          CBC w/Diff CMP     Recent Labs  Lab 04/30/14  0420 04/29/14  0354 04/25/14  1816   WBC 5.96 7.89 6.27   HEMOGLOBIN 8.9* 8.6* 9.6*   HEMATOCRIT 28.2* 26.4* 30.8*   PLATELETS 205 204 208   MCV 85.7 84.3 87.0   NEUTROPHILS  --   --  78       PT/INR           Recent Labs  Lab 04/30/14  0420 04/29/14  0354 04/28/14  0918  04/25/14  0414   SODIUM 137 138 135* More results in Results Review 135*   POTASSIUM 3.7 4.3 5.3* More results in Results Review 4.9   CHLORIDE 102 104 107 More results in Results Review 109   CO2 21* 18* 16* More results in Results Review 18*   BUN 51.0* 81.0* 103* More results in Results Review 89*   CREATININE 3.2* 4.0* 4.8* More  results in Results Review 3.4*   GLUCOSE 74 118* 213* More results in Results Review 123*   CALCIUM 8.3* 8.5 8.8 More results in Results Review 8.6   MAGNESIUM 2.3 2.3 2.9* More results in Results Review 2.7*   PHOSPHORUS 4.1 4.5  --   --  4.9*   More results in Results Review = values in this interval not displayed.   Glucose POCT     Recent Labs  Lab 04/30/14  0420 04/29/14  0354 04/28/14  0918 04/27/14  0934 04/26/14  0430 04/25/14  0414   GLUCOSE 74 118* 213* 196* 182* 123*          Rads:     Radiology Results (24 Hour)     Procedure Component Value Units Date/Time    XR Chest AP Portable [[id number]] Collected:  05/01/14 1221    Order Status:  Completed Updated:  05/01/14 1227    Narrative:      INDICATION: Dyspnea.    FINDINGS: Single AP view of the chest compared to 04/28/2014  demonstrates no significant interval change in small right pleural  effusion and right basilar subsegmental atelectasis. There is stable  mild interstitial prominence. Cardiomegaly is again noted. No  pneumothorax demonstrated.      Impression:       No significant interval change.    Gustavus Messing, MD   05/01/2014 12:23 PM              Signed by: Zachery Dauer

## 2014-05-01 NOTE — Progress Notes (Signed)
PROGRESS NOTE    Hospital Day: 3    Assessment:     AKI - likely ATN - s/p HD Saturday -   Urinating more. ? Renal recovery   CKD stage 4.   Bradycardia. Better   Metabolic acidosis.  Likely renal related  HTN - CKD - BP not controlled.   Sec. Hyperparathyroidism   Anemia of CKD. Hb low but stable     Plan:     Will check her for renal recovery.   Will hold HD for now.   Check renal panel daily and intake and output daily   Avoid beta blockers due to bradycardia.  Continue  amlodipine for better blood pressure control.  Start on Aranesp for anemia We will follow the patient closely  Discussed with ICU MD in detail     Subjective:     Pt seen today.   She is feels better   No uremic symptoms     Review of Systems:   General:  no fever, no chills, no rigor, awake and alert, feeling better   HEENT: no neck pain, no throat pain  Endocrine: no fatigue   Respiratory: no cough, shortness of breath, or wheezing   Cardiovascular: no chest pain   Gastrointestinal: no abdominal pain,no N/V/D  Musculoskeletal: no edema  Neurological: no focal weakness  Dermatological: no rash, no ulcer    Physical Exam:     Filed Vitals:    05/01/14 1400   BP: 166/73   Pulse: 79   Temp:    Resp: 24   SpO2: 92%       Intake and Output Summary (Last 24 hours) at Date Time    Intake/Output Summary (Last 24 hours) at 05/01/14 1445  Last data filed at 04/30/14 2300   Gross per 24 hour   Intake    160 ml   Output   2100 ml   Net  -1940 ml         General: No acute distress.  HEENT: No pallor.  Neck: No jugular venous distension.  Chest: Clear to auscultation.   CVS: Regular rate and rhythm. Normal S1S2. No murmur or rub.  Abdomen: Nontender, non distended. Positive bowel sounds.  No lower extremity edema.  No rash on limbs.  No urine output      Scheduled Meds: Continuous Infusions:        amLODIPine 10 mg Oral Daily   aspirin EC 81 mg Oral Daily   atorvastatin 10 mg Oral QHS   calcitRIOL 0.25 mcg Oral Daily   docusate sodium 100 mg Oral Daily    ertapenem 500 mg Intravenous Q24H   fluticasone-salmeterol 1 puff Inhalation BID   heparin (porcine) 5,000 Units Subcutaneous Q12H [facility]   hydrALAZINE 100 mg Oral Q8H [facility]   isosorbide mononitrate 60 mg Oral Daily   tamsulosin 0.4 mg Oral After breakfast   venlafaxine 25 mg Oral BID                Labs:       Recent Labs  Lab 04/30/14  0420 04/29/14  0354 04/25/14  1816   WBC 5.96 7.89 6.27   HEMOGLOBIN 8.9* 8.6* 9.6*   HEMATOCRIT 28.2* 26.4* 30.8*   PLATELETS 205 204 208       Recent Labs  Lab 04/30/14  0420 04/29/14  0354 04/28/14  0918  04/25/14  0414   SODIUM 137 138 135* More results in Results Review 135*   POTASSIUM 3.7 4.3 5.3*  More results in Results Review 4.9   CHLORIDE 102 104 107 More results in Results Review 109   CO2 21* 18* 16* More results in Results Review 18*   BUN 51.0* 81.0* 103* More results in Results Review 89*   CREATININE 3.2* 4.0* 4.8* More results in Results Review 3.4*   EGFR 17.6 13.6 11.0 More results in Results Review 16.4   GLUCOSE 74 118* 213* More results in Results Review 123*   CALCIUM 8.3* 8.5 8.8 More results in Results Review 8.6   PHOSPHORUS 4.1 4.5  --   --  4.9*   More results in Results Review = values in this interval not displayed.      URINE TYPE   Date Value Ref Range Status   04/25/2014 Clean Catch  Final     COLOR, UA   Date Value Ref Range Status   04/25/2014 Yellow Clear - Yellow Final     CLARITY, UA   Date Value Ref Range Status   04/25/2014 Sl Cloudy* Clear - Hazy Final     SPECIFIC GRAVITY UA   Date Value Ref Range Status   04/25/2014 1.012 1.001-1.035 Final     URINE PH   Date Value Ref Range Status   04/25/2014 7.0 5.0-8.0 Final     NITRITE, UA   Date Value Ref Range Status   04/25/2014 Negative Negative Final     KETONES UA   Date Value Ref Range Status   04/25/2014 Negative Negative Final     UROBILINOGEN, UA   Date Value Ref Range Status   04/25/2014 Negative 0.2  -  2.0 mg/dL Final     BILIRUBIN, UA   Date Value Ref Range Status   04/25/2014 Negative  Negative Final     BLOOD, UA   Date Value Ref Range Status   04/25/2014 Negative Negative Final     RBC, UA   Date Value Ref Range Status   04/25/2014 0 - 5 0 - 5 /hpf Final     WBC, UA   Date Value Ref Range Status   04/25/2014 26 - 50* 0 - 5 /hpf Final       Rads:     Radiology Results (24 Hour)     Procedure Component Value Units Date/Time    XR Chest AP Portable [[id number]] Collected:  05/01/14 1221    Order Status:  Completed Updated:  05/01/14 1227    Narrative:      INDICATION: Dyspnea.    FINDINGS: Single AP view of the chest compared to 04/28/2014  demonstrates no significant interval change in small right pleural  effusion and right basilar subsegmental atelectasis. There is stable  mild interstitial prominence. Cardiomegaly is again noted. No  pneumothorax demonstrated.      Impression:       No significant interval change.    Gustavus Messing, MD   05/01/2014 12:23 PM          Radiological Procedure reviewed.          Cristal Ford, MD  05/01/2014

## 2014-05-02 LAB — GLUCOSE WHOLE BLOOD - POCT
Whole Blood Glucose POCT: 96 mg/dL (ref 70–100)
Whole Blood Glucose POCT: 107 mg/dL — ABNORMAL HIGH (ref 70–100)
Whole Blood Glucose POCT: 117 mg/dL — ABNORMAL HIGH (ref 70–100)
Whole Blood Glucose POCT: 95 mg/dL (ref 70–100)

## 2014-05-02 LAB — CBC AND DIFFERENTIAL
Basophils Absolute Automated: 0.01 10*3/uL (ref 0.00–0.20)
Basophils Automated: 0 %
Eosinophils Absolute Automated: 0.34 10*3/uL (ref 0.00–0.70)
Eosinophils Automated: 6 %
Hematocrit: 28.8 % — ABNORMAL LOW (ref 37.0–47.0)
Hgb: 8.8 g/dL — ABNORMAL LOW (ref 12.0–16.0)
Immature Granulocytes Absolute: 0.02 10*3/uL
Immature Granulocytes: 0 %
Lymphocytes Absolute Automated: 1.61 10*3/uL (ref 0.50–4.40)
Lymphocytes Automated: 30 %
MCH: 27.1 pg — ABNORMAL LOW (ref 28.0–32.0)
MCHC: 30.6 g/dL — ABNORMAL LOW (ref 32.0–36.0)
MCV: 88.6 fL (ref 80.0–100.0)
MPV: 10 fL (ref 9.4–12.3)
Monocytes Absolute Automated: 0.78 10*3/uL (ref 0.00–1.20)
Monocytes: 14 %
Neutrophils Absolute: 2.7 10*3/uL (ref 1.80–8.10)
Neutrophils: 50 %
Nucleated RBC: 0 /100 WBC (ref 0–1)
Platelets: 213 10*3/uL (ref 140–400)
RBC: 3.25 10*6/uL — ABNORMAL LOW (ref 4.20–5.40)
RDW: 19 % — ABNORMAL HIGH (ref 12–15)
WBC: 5.44 10*3/uL (ref 3.50–10.80)

## 2014-05-02 LAB — COMPREHENSIVE METABOLIC PANEL
ALT: 17 U/L (ref 0–55)
AST (SGOT): 16 U/L (ref 5–34)
Albumin/Globulin Ratio: 0.8 — ABNORMAL LOW (ref 0.9–2.2)
Albumin: 2.6 g/dL — ABNORMAL LOW (ref 3.5–5.0)
Alkaline Phosphatase: 98 U/L (ref 37–106)
Anion Gap: 9 (ref 5.0–15.0)
BUN: 29 mg/dL — ABNORMAL HIGH (ref 7.0–19.0)
Bilirubin, Total: 0.4 mg/dL (ref 0.2–1.2)
CO2: 28 mEq/L (ref 22–29)
Calcium: 8.2 mg/dL — ABNORMAL LOW (ref 8.5–10.5)
Chloride: 103 mEq/L (ref 100–111)
Creatinine: 1.9 mg/dL — ABNORMAL HIGH (ref 0.6–1.0)
Globulin: 3.4 g/dL (ref 2.0–3.6)
Glucose: 98 mg/dL (ref 70–100)
Potassium: 3.6 mEq/L (ref 3.5–5.1)
Protein, Total: 6 g/dL (ref 6.0–8.3)
Sodium: 140 mEq/L (ref 136–145)

## 2014-05-02 LAB — HEMOLYSIS INDEX: Hemolysis Index: 3 (ref 0–18)

## 2014-05-02 LAB — MAGNESIUM: Magnesium: 1.9 mg/dL (ref 1.6–2.6)

## 2014-05-02 LAB — PHOSPHORUS: Phosphorus: 2.6 mg/dL (ref 2.3–4.7)

## 2014-05-02 LAB — TROPONIN I: Troponin I: 0.09 ng/mL (ref 0.00–0.09)

## 2014-05-02 LAB — B-TYPE NATRIURETIC PEPTIDE: B-Natriuretic Peptide: 3642 pg/mL — ABNORMAL HIGH (ref 0–100)

## 2014-05-02 LAB — GFR: EGFR: 32.1

## 2014-05-02 MED ORDER — TERAZOSIN HCL 5 MG PO CAPS
5.0000 mg | ORAL_CAPSULE | Freq: Every evening | ORAL | Status: DC
Start: 2014-05-02 — End: 2014-05-16
  Administered 2014-05-02 – 2014-05-15 (×14): 5 mg via ORAL
  Filled 2014-05-02 (×17): qty 1

## 2014-05-02 MED ORDER — ALPRAZOLAM 0.25 MG PO TABS
0.2500 mg | ORAL_TABLET | Freq: Four times a day (QID) | ORAL | Status: DC | PRN
Start: 2014-05-02 — End: 2014-05-16
  Administered 2014-05-02 – 2014-05-03 (×3): 0.25 mg via ORAL
  Filled 2014-05-02 (×3): qty 1

## 2014-05-02 MED ORDER — ALPRAZOLAM 0.25 MG PO TABS
0.2500 mg | ORAL_TABLET | Freq: Once | ORAL | Status: DC
Start: 2014-05-02 — End: 2014-05-02

## 2014-05-02 MED ORDER — CLONIDINE HCL 0.2 MG PO TABS
0.2000 mg | ORAL_TABLET | Freq: Two times a day (BID) | ORAL | Status: DC
Start: 2014-05-02 — End: 2014-05-16
  Administered 2014-05-02 – 2014-05-16 (×27): 0.2 mg via ORAL
  Filled 2014-05-02 (×9): qty 1
  Filled 2014-05-02: qty 2
  Filled 2014-05-02 (×18): qty 1

## 2014-05-02 MED ORDER — ALPRAZOLAM 0.25 MG PO TABS
0.2500 mg | ORAL_TABLET | Freq: Once | ORAL | Status: AC
Start: 2014-05-02 — End: 2014-05-02
  Administered 2014-05-02: 0.25 mg via ORAL
  Filled 2014-05-02: qty 1

## 2014-05-02 MED ORDER — LOSARTAN POTASSIUM 25 MG PO TABS
50.0000 mg | ORAL_TABLET | Freq: Every day | ORAL | Status: DC
Start: 2014-05-02 — End: 2014-05-02
  Administered 2014-05-02: 50 mg via ORAL
  Filled 2014-05-02: qty 2

## 2014-05-02 NOTE — Discharge Summary (Signed)
DISCHARGE NOTE    Date Time: 05/02/2014 12:26 PM  Patient Name: [name], [name]  Attending Physician: No att. providers found    Date of Admission:   04/22/2014    Date of Discharge:    04/28/2014    Reason for Admission:   Diabetes mellitus type II, uncontrolled [E11.65]  Renal insufficiency [N28.9]  Junctional bradycardia [R00.1]  Aspiration pneumonia of right lower lobe, unspecified aspiration pneumonia type [J69.0]  Complete heart block [I44.2]    Problems:   Lists the present on admission hospital problems  Present on Admission:   . Complete heart block  . Hyperkalemia    Hospital Problems:  Active Problems:    Complete heart block    Hyperkalemia      Problem Lists:  Patient Active Problem List   Diagnosis   . NSTEMI (non-ST elevated myocardial infarction)   . Hypertensive urgency   . PAD (peripheral artery disease)   . Arterial leg ulcer   . Chronic obstructive pulmonary disease, unspecified COPD type   . Ischemic cardiomyopathy   . CAD (coronary artery disease)   . Diabetes   . Chronic kidney disease   . Debilitated   . Complete heart block   . Junctional bradycardia   . Hyperkalemia   . Renal failure   . Bradycardia   . Acute on chronic renal failure   . Encephalopathy   . Azotemia                 Discharge Dx:    The patient is a 64 year old female admitted with bradycardia, chronic  kidney disease, acute kidney injury, heart failure, cardiomyopathy,  diabetes, and history of chronic wound.   AKI    Stable for transfer to [facility].      Consultations:     Cardiology, nephrology.      Procedures performed:        Hospital Course:    The patient was admitted due to profound junctional bradycardia and  hypotension.  The patient admitted to the CCU.  She received IV glucagon  and fusion.  The patient was seen by cardiology.  External pacers were  placed.  The patient's stay complicated by acute renal failure and  bronchitis and volume overload.  The patient was treated for this during  her stay.   Her condition worsened.  The patient was transferred to [facility] for closer monitoring and optimization of her renal  function in preparation for pacemaker placement where services are  available.  Case reviewed in detail with cardiology and nephrology as well  as family by telephone.  Her condition is stable for transfer.     Her prognosis is fair.  t        Discharge Medications:     Discharge Medication List as of 04/28/2014  2:53 PM      CONTINUE these medications which have NOT CHANGED    Details   hydrALAZINE (APRESOLINE) 100 MG tablet Take 1 tablet (100 mg total) by mouth 3 (three) times daily., Starting 03/25/2014, Until Discontinued, Print      acetaminophen (TYLENOL) 325 MG tablet Take 2 tablets (650 mg total) by mouth every 4 (four) hours as needed for Pain., Starting 03/14/2014, Until Discontinued, No Print      amiodarone (PACERONE) 200 MG tablet Take 1 tablet (200 mg total) by mouth daily., Starting 03/25/2014, Until Discontinued, Print      amLODIPine (NORVASC) 10 MG tablet Take 1 tablet (10 mg total)  by mouth daily., Starting 03/25/2014, Until Discontinued, Print      aspirin 325 MG tablet Take 1 tablet (325 mg total) by mouth daily., Starting 03/14/2014, Until Discontinued, No Print      atorvastatin (LIPITOR) 10 MG tablet Take 1 tablet (10 mg total) by mouth nightly., Starting 03/25/2014, Until Discontinued, Print      calcitRIOL (ROCALTROL) 0.25 MCG capsule Take 1 capsule (0.25 mcg total) by mouth daily., Starting 03/25/2014, Until Discontinued, Print      carvedilol (COREG) 25 MG tablet Take 1 tablet (25 mg total) by mouth every 12 (twelve) hours., Starting 03/14/2014, Until Discontinued, No Print      collagenase (SANTYL) ointment Apply topically daily. The affected wound area., Starting 04/20/2014, Until Discontinued, Normal      furosemide (LASIX) 20 MG tablet Take 1 tablet (20 mg total) by mouth daily., Starting 03/25/2014, Until Discontinued, Print      gabapentin (NEURONTIN) 300 MG  capsule Take 1 capsule (300 mg total) by mouth 2 (two) times daily., Starting 03/25/2014, Until Discontinued, Print      insulin aspart (NOVOLOG) 100 UNIT/ML injection Inject 1-4 Units into the skin 3 (three) times daily before meals as needed for High Blood Sugar. #90 insulin syringes and insulin needles, Starting 03/25/2014, Until Discontinued, Print      oxyCODONE-acetaminophen (PERCOCET) 5-325 MG per tablet Take 1 tablet by mouth every 4 (four) hours as needed., Starting 03/14/2014, Until Discontinued, Print      venlafaxine (EFFEXOR) 75 MG tablet Take 1 tablet (75 mg total) by mouth 2 (two) times daily., Starting 03/25/2014, Until Discontinued, Print         STOP taking these medications       aspirin EC 325 MG EC tablet        lidocaine (LIDODERM) 5 %                Discharge Instructions:        Follow-up with internal medicine and nephrology in 1 day    The patient was transported to [facility].      Signed by: Mechele Dawley, MD

## 2014-05-02 NOTE — Progress Notes (Signed)
PROGRESS NOTE  [facility]  [address], [address]  [place], [place] [address]  [phone]  Fax [phone]    Date Time: 05/02/2014 4:00 PM  Patient Name: [name], [name]      Assessment:     Bradycardia , resolved, now SR  HTN (improving control)  Coronary artery disease  - CABG 2015  CKD  DM    Problem List:     Active Hospital Problems    Diagnosis   . Renal failure   . Bradycardia   . Acute on chronic renal failure   . Encephalopathy   . Azotemia   . CAD (coronary artery disease)   . Chronic obstructive pulmonary disease, unspecified COPD type        Plan:   Continue ASA  Continue hydralazine  Increase imdur  Continue to avoid AV nodal blocking agents  Continue norvasc  Continue clonidine    Subjective:   No chest pain    Medications:     Current Facility-Administered Medications   Medication Dose Route Frequency Last Rate Last Dose   . acetaminophen (TYLENOL) tablet 650 mg  650 mg Oral Q4H PRN   650 mg at 04/30/14 2300    Or   . acetaminophen (TYLENOL) suppository 650 mg  650 mg Rectal Q4H PRN       . acetaminophen (TYLENOL) tablet 650 mg  650 mg Oral Q4H PRN   650 mg at 04/29/14 0420   . ALPRAZolam (XANAX) tablet 0.25 mg  0.25 mg Oral Q6H PRN       . amLODIPine (NORVASC) tablet 10 mg  10 mg Oral Daily   10 mg at 05/02/14 1048   . aspirin EC tablet 81 mg  81 mg Oral Daily   81 mg at 05/02/14 1048   . atorvastatin (LIPITOR) tablet 10 mg  10 mg Oral QHS   10 mg at 05/01/14 2125   . atropine injection 0.5 mg  0.5 mg Intravenous PRN       . bisacodyl (DULCOLAX) suppository 10 mg  10 mg Rectal QD PRN       . calcitRIOL (ROCALTROL) capsule 0.25 mcg  0.25 mcg Oral Daily   0.25 mcg at 05/02/14 1100   . cloNIDine (CATAPRES) tablet 0.2 mg  0.2 mg Oral BID       . darbepoetin alfa (ARANESP) injection 40 mcg  40 mcg Subcutaneous Weekly   40 mcg at 05/01/14 1727   . dextrose 50 % bolus 25 mL  25 mL Intravenous PRN       . dextrose 50 % bolus 25 mL  25 mL Intravenous PRN       . dextrose 50 %  bolus 25 mL  25 mL Intravenous PRN       . docusate sodium (COLACE) capsule 100 mg  100 mg Oral Daily   100 mg at 05/02/14 1048   . ertapenem (INVanz) 500 mg in sodium chloride 0.9 % 100 mL IVPB  500 mg Intravenous Q24H 200 mL/hr at 05/02/14 0024 500 mg at 05/02/14 0024   . fentaNYL (SUBLIMAZE) injection 12.5 mcg  12.5 mcg Intravenous Q4H PRN   12.5 mcg at 04/30/14 2120   . fluticasone-salmeterol (ADVAIR DISKUS) 250-50 MCG/DOSE 1 puff  1 puff Inhalation BID   1 puff at 05/02/14 0822   . glucagon (rDNA) (GLUCAGEN) injection 1 mg  1 mg Intramuscular PRN       . glucagon (rDNA) (GLUCAGEN) injection 1 mg  1 mg  Intramuscular PRN       . glucagon (rDNA) (GLUCAGEN) injection 1 mg  1 mg Intramuscular PRN       . heparin (porcine) injection 5,000 Units  5,000 Units Subcutaneous Q12H SCH   5,000 Units at 05/02/14 1051   . hydrALAZINE (APRESOLINE) injection 10 mg  10 mg Intravenous Q6H PRN   10 mg at 05/01/14 2331   . hydrALAZINE (APRESOLINE) tablet 100 mg  100 mg Oral Q8H SCH   100 mg at 05/02/14 1048   . insulin aspart (NovoLOG) injection 1-3 Units  1-3 Units Subcutaneous QHS PRN   1 Units at 05/01/14 1727   . insulin aspart (NovoLOG) injection 1-5 Units  1-5 Units Subcutaneous PRN       . insulin aspart (NovoLOG) injection 1-5 Units  1-5 Units Subcutaneous TID AC PRN       . isosorbide mononitrate (IMDUR) 24 hr tablet 60 mg  60 mg Oral Daily   60 mg at 05/02/14 1048   . naloxone (NARCAN) injection 0.2 mg  0.2 mg Intravenous PRN       . polyethylene glycol (MIRALAX) packet 17 g  17 g Oral QD PRN       . terazosin (HYTRIN) capsule 5 mg  5 mg Oral QHS       . venlafaxine (EFFEXOR) tablet 25 mg  25 mg Oral BID   25 mg at 05/02/14 1048            Physical Exam:     Filed Vitals:    05/02/14 1559   BP:    Pulse:    Temp: 98.6 F (37 C)   Resp:    SpO2:        Intake and Output Summary (Last 24 hours) at Date Time    Intake/Output Summary (Last 24 hours) at 05/02/14 1600  Last data filed at 05/02/14 0600   Gross per 24 hour    Intake    240 ml   Output    850 ml   Net   -610 ml       Tele- sr    CV:rr  Lungs:decrease bs  Abd:s  Ext:no edema    Labs:     Results     Procedure Component Value Units Date/Time    Glucose Whole Blood - POCT [[id number]]  (Abnormal) Collected:  05/02/14 1226     POCT - Glucose Whole blood 107 (H) mg/dL Updated:  40/98/11 9147    Glucose Whole Blood - POCT [[id number]] Collected:  05/02/14 0752     POCT - Glucose Whole blood 96 mg/dL Updated:  86/57/84 6962    Troponin I [[id number]] Collected:  05/02/14 0157    Specimen Information:  Blood Updated:  05/02/14 0258     Troponin I 0.09 ng/mL     B-type Natriuretic Peptide [[id number]]  (Abnormal) Collected:  05/02/14 0157    Specimen Information:  Blood Updated:  05/02/14 0252     B-Natriuretic Peptide 3642 (H) pg/mL     Magnesium [[id number]] Collected:  05/02/14 0157    Specimen Information:  Blood Updated:  05/02/14 0252     Magnesium 1.9 mg/dL     Phosphorus [[id number]] Collected:  05/02/14 0157    Specimen Information:  Blood Updated:  05/02/14 0252     Phosphorus 2.6 mg/dL     Hemolysis index [[id number]] Collected:  05/02/14 0157     Hemolysis Index 3 Updated:  05/02/14 0252    GFR [[id number]] Collected:  05/02/14  0157     EGFR 32.1 Updated:  05/02/14 0252    Comprehensive metabolic panel [[id number]]  (Abnormal) Collected:  05/02/14 0157    Specimen Information:  Blood Updated:  05/02/14 0252     Glucose 98 mg/dL      BUN 40.9 (H) mg/dL      Creatinine 1.9 (H) mg/dL      Sodium 811 mEq/L      Potassium 3.6 mEq/L      Chloride 103 mEq/L      CO2 28 mEq/L      CALCIUM 8.2 (L) mg/dL      Protein, Total 6.0 g/dL      Albumin 2.6 (L) g/dL      AST (SGOT) 16 U/L      ALT 17 U/L      Alkaline Phosphatase 98 U/L      Bilirubin, Total 0.4 mg/dL      Globulin 3.4 g/dL      Albumin/Globulin Ratio 0.8 (L)      Anion Gap 9.0     CBC and differential [[id number]]  (Abnormal) Collected:  05/02/14 0157    Specimen Information:  Blood / Blood Updated:  05/02/14 0234     WBC 5.44  x10 3/uL      Hgb 8.8 (L) g/dL      Hematocrit 21.3 (L) %      Platelets 213 x10 3/uL      RBC 3.25 (L) x10 6/uL      MCV 88.6 fL      MCH 27.1 (L) pg      MCHC 30.6 (L) g/dL      RDW 19 (H) %      MPV 10.0 fL      Neutrophils 50 %      Lymphocytes Automated 30 %      Monocytes 14 %      Eosinophils Automated 6 %      Basophils Automated 0 %      Immature Granulocyte 0 %      Nucleated RBC 0 /100 WBC      Neutrophils Absolute 2.70 x10 3/uL      Abs Lymph Automated 1.61 x10 3/uL      Abs Mono Automated 0.78 x10 3/uL      Abs Eos Automated 0.34 x10 3/uL      Absolute Baso Automated 0.01 x10 3/uL      Absolute Immature Granulocyte 0.02 x10 3/uL     Glucose Whole Blood - POCT [[id number]]  (Abnormal) Collected:  05/01/14 2219     POCT - Glucose Whole blood 145 (H) mg/dL Updated:  62/95/28 4132    Glucose Whole Blood - POCT [[id number]] Collected:  04/30/14 1746     POCT - Glucose Whole blood 82 mg/dL Updated:  36/64/40 3474    Glucose Whole Blood - POCT [[id number]]  (Abnormal) Collected:  05/01/14 1621     POCT - Glucose Whole blood 175 (H) mg/dL Updated:  64/33/29 5188              Signed by: Brunetta Genera, MD  Interventional Cardiology  [facility]  Office: [phone]  [place] [facility] Link: [phone]

## 2014-05-02 NOTE — Progress Notes (Incomplete)
[facility] Case Management Assessment   (for Readmissions and Non-Readmissions)  (Unit SW/CMs to complete top and Sections A to D.  ED SW/CM to complete top and Sections A, E, F.)    Please check one (X):  Pt is 30 day INPATIENT readmission x Pt is NOT a readmission    Pt is a Medicare Focus Dx  Pt is NOT a Medicare Focus Dx      Summary and Discharge Plan (identify potential needs and discharge barriers):        Pt from [facility]  Lives with dtr Sheron Nightingale    Signed by:           A. PSYCHOSOCIAL / DEMOGRAPHIC STATUS (DONE BY UNIT DCP & ED SW/CM STAFF)  Name(s) of person(s) being interviewed.  If not patient, indicate why.     Orientation and decision-making abilities of patient (AOx3 and able to make decisions, vented and sedated, etc)     Does the patient have an Advance Directive?  (Y/N)  Location?  (Home/Scanned into EPIC? If home, advised to bring in copy?) <no information>]  Advance Directive: Patient does not have advance directive]   Healthcare Decision Maker (HDM), if other than the patient?  (Include relationship and contact information & update face sheet)     Any additional emergency contacts?   Extended Emergency Contact Information  Primary Emergency Contact: Rosalee Kaufman [place]  Home Phone: [phone]  Mobile Phone: [phone]  Relation: Daughter  Secondary Emergency Contact: Adela Lank [place]  Mobile Phone: [phone]  Relation: None   Do you want to designate an individual who will care for or assist you upon discharge?  (Y/N)    If yes: Please list the name, relationship, phone number, and address of the designated individual. Name:  Relationship:  Phone:  Address:   Pt lives with... ?   Living Arrangements: Children] lives with dtr    Type of residence where patient lives Conservation officer, historic buildings, apartment, homeless shelter, ALF, etc.)?   Type of Home: Apartment]  Home Layout: One level]   Prior level of functioning (ambulation & ADL's)?   Prior level of function:  Ambulates with assistive device, Independent with ADLs]   Support System [employer], friends, extended family, etc.)?     Correct insurance listed on face sheet (verified with patient/HDM)? (Y/N)    For Medicare/Medicare HMO patients only: Was an initial IMM signed within 24 hours of admission?  (Go to Chart Review tab > Media tab)         B. CURRENT DISCHARGE PLANNING SERVICES (DONE BY UNIT DCP STAFF ONLY)  Name of Primary Care Physician verified in patient banner (update in patient banner if not listed) Karl Ito, MD  [phone]   What DME does the patient currently own? (rolling walker, hospital bed, home O2, BiPAP/CPAP, bedside commode, cane, hoyer lift) Assistive Device: Front wheel walker]  DME Currently at Home: Front wheel walker]  Has scooter for distance   ]   Are PT/OT services indicated? If so, has it been ordered?      Has the patient been to an Acute Rehab or SNF in the past?  If so, where?    n   Does the patient currently have home health or hospice/palliative services in place?  If so, list agency name.       Does the patient already have community dialysis set up?  If so, where?  C. ANTICIPATED DISCHARGE PLAN (DONE BY UNIT DCP STAFF ONLY)  Anticipated Disposition: Option A     discharge to SNF   Anticipated Disposition: Option B         Patient/family aware and in agreement with discharge plan(s)?    Who will transport the patient upon discharge?    sons    If applicable, were SNF or Hospice choices provided?       Palliative Care Consult needed? (if yes, contact attending MD) ([facility], [facility], [facility], [facility]) Consider Palliative Care Consult?: NO (04/28/14 1500)  Does Not Meet any above Criteria: None of above criteria (04/28/14 1500)  ([facility] ONLY) Consider Palliative Care Consult?: NO (04/28/14 1500)       D. READMISSION ASSESSMENT (DONE BY UNIT DCP STAFF ONLY, IF READMIT)  What is the current LACE score?     Is this patient an inpatient to inpatient 30 day readmission?     Previous admission  discharge diagnosis?    Was patient readmitted from a facility?  If so, which one?        Patient active with Home Health?        Patient active with Home Hospice?       Contributing factors to readmission (i.e., no follow up appt on previous d/c, unable to get meds, no insurance, no social support, etc.)    Did patient/family understand what medication was for and how to administer, symptoms to indicate worsening condition, activity and diet restrictions at time of previous d/c?        Previous admission discharge diagnosis?     If readmission, what was previous discharge plan?    What contributed to readmission? (No follow up appt on previous [place], unable to get meds, no insurance, no social support, etc.)     Was there understanding at time of previous discharge of:  1. Reason for medications,  2. Symptoms that indicate worsening conditions,  3. Activity/Diet restrictions?    IN EPIC:  CM Assessment complete button selected in CM Navigator? Yes/No    IN EPIC:  Readmissions flowsheet completed in CM Navigator if patient is readmission? Yes/No         E. ED READMISSION SCREENING (DONE BY ED SW/CM ONLY)  What is patient's LACE score?    Current ED diagnosis   Reason for Admission     Chronic obstructive pulmonary disease, unspecified COPD type    -  Primary J44.9     Sick     R69     Coronary artery disease involving native coronary artery, angina presence unspecified, unspecified whether native or transplanted heart     I25.10     Bradycardia     R00.1     Acute on chronic renal failure     N17.9, N18.9     Encephalopathy     G93.40     Coronary artery disease involving native coronary artery without angina pectoris, unspecified whether native or transplanted heart     I25.10     Renal failure     N19          Previous admission discharge diagnosis     Previous admission date and facility 04/22/14  [facility]   Is this ED visit related to previous admission?     Contributing factors to readmission (i.e., no  follow up appt on previous d/c, unable to get meds, no insurance, no social support, etc.)    Did patient/family understand what medication was for and  how to administer, symptoms to indicate worsening condition, activity and diet restrictions at time of previous d/c?        Was there understanding at time of previous discharge of:  1. Reason for medications,  2. Symptoms that indicate worsening conditions,  3. Activity/Diet restrictions?    Was patient readmitted from a facility?  If so, which one?        Patient active with Home Health?        Patient active with Home Hospice?       What DME does the patient currently own? (rolling walker, hospital bed, home O2, BiPAP/CPAP, bedside commode, cane, hoyer lift) Assistive Device: Front wheel walker]  DME Currently at Home: Front wheel walker]   ]   Does the patient currently have home health or hospice/palliative services in place?  If so, list agency name.      Does the patient already have community dialysis set up?  If so, where?    Name of attending/ED MD notified of potential readmission, method and time     Can Case Management interventions be implemented to avoid readmission?  (Y/N)     Patient/family aware and in agreement with intervention(s)?     ED Disposition (admit or discharge)     Admitting diagnosis    Admitting patient class    IN EPIC:  CM Assessment complete button selected in CM Navigator? Yes/No    IN EPIC:  Readmissions flowsheet completed in CM Navigator if patient is readmission? Yes/No           F. CM INTERVENTIONS DONE TO PREVENT READMISSION (DONE BY ED SW/CM ONLY)   X Intervention(s):  Select all that apply Facility / Therapist, art         Palliative/Hospice Care Services         Facility (LTC, LTACH, SNF, Acute Rehab, ALF)         APPOINTMENT:  Discharge Clinic follow up         APPOINTMENT:  PCP follow-up        APPOINTMENT:  IMG follow-up        Any other Services (TCM, CSB, Resources)         Template created and updated by [employer], CM Supervisor (11/15/13)

## 2014-05-02 NOTE — OT Eval Note (Signed)
[facility]   Occupational Therapy Evaluation and Treatment    Patient: [name]     MRN#: [id number]   Unit: MED SURG ICU 1  Bed: A2919/A2919-01    Time of treatment: Time Calculation  OT Received On: 05/02/14  Start Time: 1019  Stop Time: 1117  Time Calculation (min): 58 min    Eval: 11 minutes  Treat 47 minutes    Consult received for [name] for OT Evaluation and Treatment.  Patient's medical condition is appropriate for Occupational therapy intervention at this time.    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 female admitted on 04/28/2014 from [facility]. Patient presented to [facility] with dizziness, fatigue and found to have brady junctional rhythm and acute on chronic renal failure. Patient was transferred to [facility] for pacemaker placement. RRT called for HR in the 40s. Pacemaker deferred 2/2 to emergent need for dialysis, triple lumen dialysis cath placed 2/11.     Patient with recent CABG (Dec 2015). 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 Per Pod on 2/11 "Pt will benefit from BKA at the current time given the chronicity of the condition, although she prefers palliative type of care with a little as possible intervention."       Patient Active Problem List   Diagnosis   . NSTEMI (non-ST elevated myocardial infarction)   . Hypertensive urgency   . PAD (peripheral artery disease)   . Arterial leg ulcer   . Chronic obstructive pulmonary disease, unspecified COPD type   . Ischemic cardiomyopathy   . CAD (coronary artery disease)   . Diabetes   . Chronic kidney disease   .  Debilitated   . Complete heart block   . Junctional bradycardia   . Hyperkalemia   . Renal failure   . Bradycardia   . Acute on chronic renal failure   . Encephalopathy   . Azotemia        Past Medical/Surgical History:  Past Medical History   Diagnosis Date   . Diabetes mellitus    . Chronic kidney disease    . Myocardial infarction    . Hypertension    . Hyperlipidemia    . Peripheral arterial disease      06/28/13 right superficial femoral artery stenosis, tibial peroneal trunk stenosis, left CVL a stenosis, treated with angiography and angioplasty.   . Coronary artery disease      Left circumflex artery presumed DES.,  Chronically occluded right coronary artery stent   . Anemia      She redid to chronic disease and iron deficiency.     . Arterial leg ulcer      2014, receiving home health   . Chronic obstructive pulmonary disease      Previously on Spiriva and Advair      Past Surgical History   Procedure Laterality Date   . Cardiac angiography and angioplasty  06/2013   . Colonoscopy  Unknown   . Tubal ligation     . Coronary artery bypass N/A 03/04/2014     Procedure: CORONARY ARTERY BYPASS;  Surgeon: Austin Miles, MD;  Location: [facility] HEART OR;  Service: Cardiovascular;  Laterality: N/A;  LIMA to LAD  SVG to OM   . Endoscopic,vein harvest Left 03/04/2014     Procedure: ENDOSCOPIC,VEIN HARVEST;  Surgeon: Austin Miles, MD;  Location: [facility] HEART OR;  Service: Cardiovascular;  Laterality: Left;  By D. Lazarus Gowda, RNFA. Left groin to mid-calf.   Rhae Hammock N/A 03/04/2014     Procedure: TEE;  Surgeon: Austin Miles, MD;  Location: [facility] HEART OR;  Service: Cardiovascular;  Laterality: N/A;  probe [phone]         X-Rays/Tests  Lab Results   Component Value Date/Time    HGB 8.8* 05/02/2014 01:57 AM    HEMATOCRIT 28.8* 05/02/2014 01:57 AM    POTASSIUM 3.6 05/02/2014 01:57 AM    SODIUM 140 05/02/2014 01:57 AM    PT INR 1.3* 04/22/2014 02:09 PM    TROPONIN I 0.09 05/02/2014 01:57 AM    TROPONIN I 0.07 04/30/2014 05:48 PM     TROPONIN I 0.10* 04/30/2014 11:54 AM    TROPONIN I 0.03 04/22/2014 02:07 PM     CXR (no significant interval change in small right pleural effusion and right basilar subsegmental atelectasis. There is stable mild interstitial prominence. Cardiomegaly is again noted. 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 Nursing clears patient for therapy.   Patient Goal: Get back in bed and rest  Pain Assessment  Pain Assessment: No/denies pain      Objective:   Inspection/Posture  Inspection/Posture: dressing R heel, midfoot  Observation of Patient/Vital Signs: VSS    Patient received seated in a bedside chair with Telemetry and O2 at 2 liters/minute via NC  in place.    Cognitive Status and Neuro Exam:  Cognition/Neuro Status  Arousal/Alertness: Appropriate responses to stimuli  Attention Span: Appears intact  Orientation Level: Oriented X4  Memory: Appears intact  Following Commands: independent  Safety Awareness: independent  Insights: Decreased awareness of deficits  Problem Solving: minimal assistance  Behavior: flat affect  Motor Planning: intact  Neuro Status  Behavior:  flat affect  Motor Planning: intact       Musculoskeletal Examination  Gross ROM  Right Upper Extremity ROM: within functional limits (limited to 90* 2/2 to stenal precautions)  Left Upper Extremity ROM: within functional limits (limited to 90* 2/2 to stenal precautions)  Gross Strength  Right Upper Extremity Strength: 3/5 (grossly, NT 2/2 to sternal precautions)  Left Upper Extremity Strength: 3/5 (grossly, NT 2/2 to sternal precautions)  Tone  Tone: within functional limits    Sensory/Oculomotor Examination  Sensory  Auditory: intact  Tactile - Light Touch: intact  Activities of Daily Living  Self-care and Home Management  Eating: Independent  Grooming: Stand by Assist  Bathing: setup, in chair  UB Dressing: Stand by Assist  LB Dressing: Stand by Assist  Toileting: Stand by Assist  Functional Transfers: Minimal Assist    Functional Mobility:  Mobility and Transfers  Rolling: Stand by Assist  Sit to Supine: Minimal Assist  Sit to Stand: Minimal Assist  Bed to Chair: Minimal Assist  Functional Mobility/Ambulation: Contact Guard Assist (~3 steps to bed)     Balance  Balance  Static Sitting Balance: normal  Dyanamic Sitting Balance: normal    Participation and Activity Tolerance  Participation and Endurance  Participation Effort: fair  Endurance: Tolerates < 10 min exercise, no significant change in vital signs    G codes: yes  Self Care, Current Status (Z6109): CK   Self Care, Goal Status (U0454): CJ          Educated the patient to role of occupational therapy, plan of care, goals of therapy and discharge planning. Patient verbalized understanding.      Patient left in bed with L SCD in place and call bell within reach. RN notified of session outcome    Assessment: CANDANCE BOHLMAN is a 64 y.o. female admitted on 04/28/2014. Pt presents with Assessment: decreased ROM, decreased strength, balance deficits, decreased independence with ADLs, decreased independence with IADLs  Pt would benefit from skilled  Occupational Therapy to maximize safety and return to prior level of independence with ADLs.    Treatment: Patient received sitting in bedside chair requesting to get back to bed. Patient agreeable to complete bathing in chair prior to getting back to bed. Patient required SBA for UB/LB bathing. Transferred back to bed with Min A. Patient reports being unable to use RW 2/2 to sternal precautions, and is limited by pain for WB on R LE leading to unstable gait. At this time recommending home with home health, may benefit from W/C for long distances.     Rehabilitation Potential: Prognosis: Fair, With continued OT s/p acute discharge    Plan: OT Frequency Recommended: 3-4x/wk   Treatment Interventions: ADL retraining, Functional transfer training, UE strengthening/ROM, Endurance training, Patient/Family training, Equipment eval/education     Risks/benefits/POC discussed with patient    Goals: Time For Goal Achievement: 5 visits  ADL Goals  Patient will dress upper body: Independent  Patient will dress lower body: Independent  Pt will complete bathing: Supervision  Patient will toilet: Supervision  Mobility and Transfer Goals  Pt will perform functional transfers: Supervision        Executive Fucntion Goals  Pt will follow energy conservation techniques:  (Goal From Prior Admission)      DME Recommended for Discharge:  (May benefit from W/C)  Discharge Recommendation: Home with supervision, Home with home health OT     Lennox Laity, [place] #[id number]  05/02/2014 11:36 AM

## 2014-05-02 NOTE — Progress Notes (Signed)
PROGRESS NOTE    Date Time: 05/02/2014 1:18 PM  Patient Name: [name]      Subjective:   Bun 29  crt 1.9  Wbc 5.9  hct 28    Temp 98  SBP 189         Review of Systems:   A comprehensive review of systems was: General ROS: negative for - chills, fever or night sweats  Psychological ROS: negative for - anxiety, depression, disorientation, hallucinations or suicidal ideation  ENT ROS: negative for - headaches, nasal congestion, sinus pain or visual changes  Allergy and Immunology ROS: negative for - itchy/watery eyes, nasal congestion or postnasal drip  Hematological and Lymphatic ROS: negative for - bleeding problems, bruising, pallor or weight loss  Endocrine ROS: negative for - malaise/lethargy or polydipsia/polyuria  Respiratory ROS: negative for - cough, orthopnea, shortness of breath or wheezing  Cardiovascular ROS: negative for - chest pain, dyspnea on exertion, orthopnea or shortness of breath  Gastrointestinal ROS: negative for - abdominal pain, constipation, diarrhea or nausea/vomiting  Genito-Urinary ROS: negative for - change in urinary stream, dysuria, hematuria or nocturia  Musculoskeletal ROS: negative for - joint pain, joint stiffness or joint swelling  Neurological ROS: negative for - confusion, dizziness, headaches, memory loss or speech problems  Dermatological ROS: negative for pruritus, rash and skin lesion changes    Physical Exam:     Filed Vitals:    05/02/14 1200   BP: 165/72   Pulse: 90   Temp:    Resp: 13   SpO2: 95%       General appearance -awake  Mental status -awake talking  Eyes - pupils equal and reactive, extraocular eye movements intact  Nose - normal and patent, no erythema, discharge or polyps  Mouth - mucous membranes moist, pharynx normal without lesions  Neck - supple, no significant adenopathy  Lymphatics - no palpable lymphadenopathy, no hepatosplenomegaly  Chest - clear to auscultation, no wheezes, rales or rhonchi, symmetric air entry  Heart - normal rate, regular  rhythm, normal S1, S2, no murmurs, rubs, clicks or gallops  Abdomen - soft, nontender, nondistended, no masses or organomegaly  Neurological - awake   Musculoskeletal - no joint tenderness, deformity or swelling  Extremities - peripheral pulses normal, no pedal edema, no clubbing or cyanosis  Skin - normal coloration and turgor, no rashes, no suspicious skin lesions noted    Medications:     Current Facility-Administered Medications   Medication Dose Route Frequency   . amLODIPine  10 mg Oral Daily   . aspirin EC  81 mg Oral Daily   . atorvastatin  10 mg Oral QHS   . calcitRIOL  0.25 mcg Oral Daily   . cloNIDine  0.2 mg Oral BID   . darbepoetin alfa (ARANESP) injection  40 mcg Subcutaneous Weekly   . docusate sodium  100 mg Oral Daily   . ertapenem  500 mg Intravenous Q24H   . fluticasone-salmeterol  1 puff Inhalation BID   . heparin (porcine)  5,000 Units Subcutaneous Q12H [facility]   . hydrALAZINE  100 mg Oral Q8H SCH   . isosorbide mononitrate  60 mg Oral Daily   . terazosin  5 mg Oral QHS   . venlafaxine  25 mg Oral BID       Intake and Output Summary (Last 24 hours) at Date Time    Intake/Output Summary (Last 24 hours) at 05/02/14 1318  Last data filed at 05/02/14 0600   Gross per 24  hour   Intake    240 ml   Output    850 ml   Net   -610 ml           Labs:       Recent Labs  Lab 05/02/14  0157 04/30/14  0420 04/29/14  0354   SODIUM 140 137 138   POTASSIUM 3.6 3.7 4.3   CHLORIDE 103 102 104   CO2 28 21* 18*   BUN 29.0* 51.0* 81.0*   CREATININE 1.9* 3.2* 4.0*   CALCIUM 8.2* 8.3* 8.5   ALBUMIN 2.6*  --   --    PROTEIN, TOTAL 6.0  --   --    BILIRUBIN, TOTAL 0.4  --   --    ALKALINE PHOSPHATASE 98  --   --    ALT 17  --   --    AST (SGOT) 16  --   --    GLUCOSE 98 74 118*       Recent Labs  Lab 05/02/14  0157   WBC 5.44   HEMOGLOBIN 8.8*   HEMATOCRIT 28.8*   PLATELETS 213               Rads:     Radiology Results (24 Hour)     ** No results found for the last 24 hours. **            Assessment:     Patient Active  Problem List   Diagnosis   . NSTEMI (non-ST elevated myocardial infarction)   . Hypertensive urgency   . PAD (peripheral artery disease)   . Arterial leg ulcer   . Chronic obstructive pulmonary disease, unspecified COPD type   . Ischemic cardiomyopathy   . CAD (coronary artery disease)   . Diabetes   . Chronic kidney disease   . Debilitated   . Complete heart block   . Junctional bradycardia   . Hyperkalemia   . Renal failure   . Bradycardia   . Acute on chronic renal failure   . Encephalopathy   . Azotemia       Plan:   Bradycardia  Junctional rhythm  Off BB     Hx CAD    Hx cardiomyopathy  Hx aortic stenosis     Acute on chronic RF   On HD    Encephalopathy  metablolic   imoproved       DM SSI monitor glucose      R/O sepsis  Hypothermic resolved  Evaluated by ID  UC bld cx on invanz   cx proteus         Hypertension on hydralazine and norvasc  Signed by: Laveda Norman, MD  05/02/2014  1:18 PM

## 2014-05-02 NOTE — Progress Notes (Signed)
INFECTIOUS DISEASES PROGRESS NOTE    Date Time: 05/02/2014 12:43 PM  Patient Name: [name], [name]  Patient status.Inpatient  Hospital Day: 4    Assessment and Plan:   1.  UTI  -Cx =MDR Proteus  2. No fever or leucocytosis      PLAN: 1. Ertapenem ends today  Subjective:   Awake and oriented      Review of Systems:   Review of Systems - General ROS: negative    Antibiotics:   Day 5  Other medications reviewed in EPIC.  Central Access:   Fem line    Physical Exam:   Afeb  Filed Vitals:    05/02/14 0700   BP:    Pulse: 90   Temp:    Resp: 16   SpO2: 98%       Chest - clear to auscultation, no wheezes, rales or rhonchi, symmetric air entry  Heart - normal rate, regular rhythm, normal S1, S2, no murmurs, rubs, clicks or gallops  Abdomen - soft, nontender, nondistended, no masses or organomegaly    Labs:     Microbiology Results     Procedure Component Value Units Date/Time    MRSA culture [[id number]] Collected:  04/29/14 0034    Specimen Information:  Body Fluid / Nares and Throat Updated:  04/29/14 0516    MRSA culture [[id number]] Collected:  04/29/14 0034    Specimen Information:  Body Fluid / Nares and Throat Updated:  04/29/14 0516    Urine culture [[id number]] Collected:  04/29/14 0354    Specimen Information:  Urine / Urine, Catheterized, Foley Updated:  04/29/14 0808          CBC w/Diff CMP     Recent Labs  Lab 05/02/14  0157 04/30/14  0420 04/29/14  0354 04/25/14  1816   WBC 5.44 5.96 7.89 6.27   HEMOGLOBIN 8.8* 8.9* 8.6* 9.6*   HEMATOCRIT 28.8* 28.2* 26.4* 30.8*   PLATELETS 213 205 204 208   MCV 88.6 85.7 84.3 87.0   NEUTROPHILS 50  --   --  78       PT/INR           Recent Labs  Lab 05/02/14  0157 04/30/14  0420 04/29/14  0354   SODIUM 140 137 138   POTASSIUM 3.6 3.7 4.3   CHLORIDE 103 102 104   CO2 28 21* 18*   BUN 29.0* 51.0* 81.0*   CREATININE 1.9* 3.2* 4.0*   GLUCOSE 98 74 118*   CALCIUM 8.2* 8.3* 8.5   MAGNESIUM 1.9 2.3 2.3   PHOSPHORUS 2.6 4.1 4.5   PROTEIN, TOTAL 6.0  --   --    ALBUMIN 2.6*  --   --    AST  (SGOT) 16  --   --    ALT 17  --   --    ALKALINE PHOSPHATASE 98  --   --    BILIRUBIN, TOTAL 0.4  --   --       Glucose POCT     Recent Labs  Lab 05/02/14  0157 04/30/14  0420 04/29/14  0354 04/28/14  0918 04/27/14  0934 04/26/14  0430   GLUCOSE 98 74 118* 213* 196* 182*          Rads:     Radiology Results (24 Hour)     ** No results found for the last 24 hours. **            Signed by: Zachery Dauer

## 2014-05-02 NOTE — Plan of Care (Signed)
AX0 X4, Moves all ex, generalized weakness @ BLE, PT/OT working with pt. Denies pain/sob. SR@ 70s-90s, b/p 140s-180s/60s-80s. Hydralazine scheduled given. Now on 1L NC, Diminished lung sounds. Positive bowel sounds, poor appetite. BS ACHS, 140S, no coverage needed. Dressing to RLE intact, changed daily as ordered. Pacer and atropine @ bedside. Pt repositioned for comfort, call bell within reach, will continue to monitor.    Plan: Possible pacer after renal stability, possible HD today

## 2014-05-02 NOTE — Progress Notes (Signed)
PROGRESS NOTE    Hospital Day: 4    Assessment:     AKI - likely ATN - s/p last HD Saturday   Urinating more. ? Renal recovery   CKD stage 4.   Metabolic acidosis.  Likely renal related  HTN - CKD - BP not controlled.   Sec. Hyperparathyroidism   Anemia of CKD. Hb low but stable     Plan:     Last HD was on  Saturday.  Hemodialysis on hold.  Evaluate kidney function every day  Check renal panel daily and intake and output daily   Avoid beta blockers due to bradycardia.  Continue  amlodipine for better blood pressure control.  Continue Aranesp for anemia   We will follow the patient closely      Subjective:     [name] seen today.   She is feels better   No uremic symptoms     Review of Systems:   General:  no fever, no chills, no rigor, awake and alert, feeling better   HEENT: no neck pain, no throat pain  Endocrine: no fatigue   Respiratory: no cough, shortness of breath, or wheezing   Cardiovascular: no chest pain   Gastrointestinal: no abdominal pain,no N/V/D  Musculoskeletal: no edema  Neurological: no focal weakness  Dermatological: no rash, no ulcer    Physical Exam:     Filed Vitals:    05/02/14 1700   BP: 163/72   Pulse: 93   Temp:    Resp: 20   SpO2: 96%       Intake and Output Summary (Last 24 hours) at Date Time    Intake/Output Summary (Last 24 hours) at 05/02/14 1838  Last data filed at 05/02/14 1600   Gross per 24 hour   Intake    720 ml   Output   1150 ml   Net   -430 ml         General: No acute distress.  HEENT: No pallor.  Neck: No jugular venous distension.  Chest: Clear to auscultation.   CVS: Regular rate and rhythm. Normal S1S2. No murmur or rub.  Abdomen: Nontender, non distended. Positive bowel sounds.  No lower extremity edema.  No rash on limbs.  No urine output      Scheduled Meds: Continuous Infusions:        amLODIPine 10 mg Oral Daily   aspirin EC 81 mg Oral Daily   atorvastatin 10 mg Oral QHS   calcitRIOL 0.25 mcg Oral Daily   cloNIDine 0.2 mg Oral BID   darbepoetin alfa (ARANESP) injection  40 mcg Subcutaneous Weekly   docusate sodium 100 mg Oral Daily   ertapenem 500 mg Intravenous Q24H   fluticasone-salmeterol 1 puff Inhalation BID   heparin (porcine) 5,000 Units Subcutaneous Q12H [facility]   hydrALAZINE 100 mg Oral Q8H SCH   isosorbide mononitrate 60 mg Oral Daily   terazosin 5 mg Oral QHS   venlafaxine 25 mg Oral BID                Labs:       Recent Labs  Lab 05/02/14  0157 04/30/14  0420 04/29/14  0354   WBC 5.44 5.96 7.89   HEMOGLOBIN 8.8* 8.9* 8.6*   HEMATOCRIT 28.8* 28.2* 26.4*   PLATELETS 213 205 204       Recent Labs  Lab 05/02/14  0157 04/30/14  0420 04/29/14  0354   SODIUM 140 137 138   POTASSIUM 3.6 3.7 4.3  CHLORIDE 103 102 104   CO2 28 21* 18*   BUN 29.0* 51.0* 81.0*   CREATININE 1.9* 3.2* 4.0*   EGFR 32.1 17.6 13.6   GLUCOSE 98 74 118*   CALCIUM 8.2* 8.3* 8.5   ALBUMIN 2.6*  --   --    PHOSPHORUS 2.6 4.1 4.5         URINE TYPE   Date Value Ref Range Status   04/25/2014 Clean Catch  Final     COLOR, UA   Date Value Ref Range Status   04/25/2014 Yellow Clear - Yellow Final     CLARITY, UA   Date Value Ref Range Status   04/25/2014 Sl Cloudy* Clear - Hazy Final     SPECIFIC GRAVITY UA   Date Value Ref Range Status   04/25/2014 1.012 1.001-1.035 Final     URINE PH   Date Value Ref Range Status   04/25/2014 7.0 5.0-8.0 Final     NITRITE, UA   Date Value Ref Range Status   04/25/2014 Negative Negative Final     KETONES UA   Date Value Ref Range Status   04/25/2014 Negative Negative Final     UROBILINOGEN, UA   Date Value Ref Range Status   04/25/2014 Negative 0.2  -  2.0 mg/dL Final     BILIRUBIN, UA   Date Value Ref Range Status   04/25/2014 Negative Negative Final     BLOOD, UA   Date Value Ref Range Status   04/25/2014 Negative Negative Final     RBC, UA   Date Value Ref Range Status   04/25/2014 0 - 5 0 - 5 /hpf Final     WBC, UA   Date Value Ref Range Status   04/25/2014 26 - 50* 0 - 5 /hpf Final       Rads:     Radiology Results (24 Hour)     ** No results found for the last 24 hours. **         Radiological Procedure reviewed.          Cristal Ford, MD  05/02/2014

## 2014-05-02 NOTE — Treatment Plan (Signed)
Severe Sepsis Screen  Date: 05/02/2014 Time: 9:19 PM  Nurse Signature: Misty Stanley Erie Sica    A. Infection:    Does your patient have ONE or more of the following infection criteria?     [x]  Documented Infection - Does the patient have positive culture results (from blood,        sputum, urine, etc)?   []  Anti-Infective Therapy - Is the patient receiving antibiotic, antifungal, or other                anti-infective therapy?   []  Pneumonia - Is there documentation of pneumonia (X Ray, etc)?   []  WBC's - Have WBC's been found in normally sterile fluid (urine, CSF, etc.)?   []  Perforated Viscus -Does the patient have a perforated hollow organ (bowel)?    A.  Did you check any of the boxes above?    []  No  If No, Stop Here and Sepsis Screen Negative.               [x]  Yes, continue to section B      B. SIRS:     Does your patient have TWO or more of the following SIRS criteria (ensure vital signs & temperature are within 1 hour of this screening)?    []  Temperature - Is the patient's temperature: Temp: 98.4 F (36.9 C) (05/02/14 2000)   - Greater than or equal to 38.3 degrees C (greater than 100.9 degrees F)?   - Less than or equal to 36 degrees C (less than or equal to 96.8 degrees F)?    []  Heart Rate: Heart Rate: 87 (05/02/14 2100)   - Is the patient's heart rate greater than or equal to 90 bpm?    []  Respiratory: Resp Rate: 19 (05/02/14 2100)   - Is the patient's respiratory rate greater than or equal to 20?    []  WBC Count - Is the patient's WBC count:   Recent Labs  Lab 05/02/14  0157   WBC 5.44      - Greater than or equal to 12,000/mm3 OR   - Less than or equal to 4,000/mm3 OR    - Are there greater than 10% immature neutrophils (bands)?    []  Glucose >140 without diabetes?   Recent Labs  Lab 05/02/14  0157   GLUCOSE 98       []  Significant edema?    B.  Did you check two or more of the boxes above?     [x]  No, Stop Here and Sepsis Screen Negative   []  Yes, continue to section C    C.  Acute Organ Dysfunction     Does  your patient have ONE or more of the following organ dysfunction? (May need to wait for lab results for assessment - see below) Organ dysfunction must be a result of the sepsis not chronic conditions.    []  Cardiovascular - Does the patient have a: BP: 164/73 mmHg (05/02/14 2100)   -systolic Blood pressure less than or equal to 90 mmHg OR   -systolic blood pressure has dropped 40 mmHg or more from baseline OR   -mean arterial pressure less than or equal to 70 mmHg (for at least one hour   despite fluid resuscitation OR require vasopressor support?  []  Respiratory - Does the patient have new hypoxia defined by any of the following"   -A sustained increase in oxygen requirements by at least 2L/min on NC or 28%    FiO2 within the  last 24 hrs OR   -A persistent decrease in oxygen saturation of greater than or equal to 5% lasting   at least four or more hours and occurring within the last 24 hours  []  Renal - Does the patient have:   -low urine output (e.g. Less than 0.53mL/kg/HR for one hour despite adequate fluid    resuscitation, OR   -Increased creatinine (greater than 50% increase from baseline) OR   -require acute dialysis?  []  Hematologic - Does the patient have a:   -Low platelet count (less than 100,000 mm3)   Recent Labs  Lab 05/02/14  0157   PLATELETS 213   OR   -INR/aPTT greater than upper limit of normal?   or                    []  Metabolic - Does the patient have a high lactate (plasma lactate greater than or equal to 2.4 mMol/L)?   Recent Labs  Lab 04/28/14  1647   LACTIC ACID 1.3       []  Hepatic - Are the patient's liver enzymes elevated (ALT greater than 72 IU/L or Total      Bilirubin greater than 2 MG/dL)?    Recent Labs  Lab 05/02/14  0157   BILIRUBIN, TOTAL 0.4   ALT 17     []  CNS - Does the patient have altered consciousness or reduced Glasgow Coma      Scale?    C.  Did you check any of the boxes above?     []  No, Sepsis Screen Negative   []  YES:  A) Infection + B) SIRS + C) Organ Dysfunction =  Positive Screen for Severe Sepsis     Notify MD and document in Complex Assessment under provider notification   - Name of physician notified:                                         - Date/Time Notifiied:                                       Document actions: Following must be completed within 1 hour of positive sepsis screen   []  Lactate drawn (if initial lactate > , repeat lactate in 2 hours for goal decrease 10-20%)   []  Blood Cultures obtained (prior to antibiotic administration; if not done within the last 48 hours)   []  Antibiotics initiated or modified    []   IV Fluid administered 0.9% NS _______ mLs given (Initial Bolus of 30 ml/kg if SBP < 90 or MAP < 65 or lactate greater than 4 mmol/dl)  Nursing Comments?:     _________________________________________________________________    Patients meeting the following criteria are excluded from screening (check if applicable):   []  [place] hypothermia protocol   []  Comfort Care orders   []  Surgery- No screening for 24 hours after surgery (48 hours after CV surgery)       -If Yes. Date of surgery:______________________   []  Admitted with sepsis and until 72 hours after admission with documented sepsis (RESUME SEPSIS SCREEN AFTER 72 hour window!!!)      -If Yes, Date of Documented Sepsis:______________________   []  Positive screen AND Completed sepsis bundle during previous 24 hours       -  If Yes, Date/Time of positive severe sepsis screen:_______________________

## 2014-05-02 NOTE — Treatment Plan (Incomplete)
3 nurses tripled. 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 Many of [place] have 2 income households but if we did not would not be able to afford to live here. Our nurses are leaving left and right to travel and do float pool because there is no incentive for them to stay. Then the transient workers we have in the unit are are not teaming up and owning anything because they are not invested in our unit. So our standards are down in general and many of the standards bearers have left.

## 2014-05-02 NOTE — Plan of Care (Signed)
Problem: Occupational Therapy  Goal: Patient condition is improving per Occupational Therapy Treatment Plan  Outcome: Progressing  Discharge Recommendation: Home with supervision, Home with home health OT   DME Recommended for Discharge:  (May benefit from W/C)    OT Frequency Recommended: 3-4x/wk     Is PT evaluation indicated at this time? This patient is already on PT caseload.     Early/Progressive Mobility Protocol Level: Step 6: Ambulate in Room (with assistance)  (Please See Therapy Evaluation for device and assistance level needed)    Treatment Interventions: ADL retraining, Functional transfer training, UE strengthening/ROM, Endurance training, Patient/Family training, Equipment eval/education           Goal Formulation: Patient  Time For Goal Achievement: 5 visits  ADL Goals  Patient will dress upper body: Independent  Patient will dress lower body: Independent  Pt will complete bathing: Supervision  Patient will toilet: Supervision  Mobility and Transfer Goals  Pt will perform functional transfers: Supervision        Executive Fucntion Goals  Pt will follow energy conservation techniques:  (Goal From Prior Admission)

## 2014-05-02 NOTE — Progress Notes (Signed)
PROGRESS NOTE    Date Time: 05/02/2014 11:29 AM  Patient Name: KADINCE, BOXLEY L      Subjective:   Doing fairly well, awake, alert, up in a chair.Marland Kitchen  Anxiety, received.  Labs noted with hypertension, afebrile, on 1 L O2 saturation 98 percent.  Labs noted.  Normal WBC, stable hematocrit, and improving renal function.    Medications:      Scheduled Meds: PRN Meds:        amLODIPine 10 mg Oral Daily   aspirin EC 81 mg Oral Daily   atorvastatin 10 mg Oral QHS   calcitRIOL 0.25 mcg Oral Daily   darbepoetin alfa (ARANESP) injection 40 mcg Subcutaneous Weekly   docusate sodium 100 mg Oral Daily   ertapenem 500 mg Intravenous Q24H   fluticasone-salmeterol 1 puff Inhalation BID   heparin (porcine) 5,000 Units Subcutaneous Q12H SCH   hydrALAZINE 100 mg Oral Q8H SCH   isosorbide mononitrate 60 mg Oral Daily   losartan 50 mg Oral Daily   tamsulosin 0.4 mg Oral After breakfast   venlafaxine 25 mg Oral BID         Continuous Infusions:      acetaminophen 650 mg Q4H PRN   Or     acetaminophen 650 mg Q4H PRN   acetaminophen 650 mg Q4H PRN   ALPRAZolam 0.25 mg Q6H PRN   atropine 0.5 mg PRN   bisacodyl 10 mg QD PRN   dextrose 25 mL PRN   dextrose 25 mL PRN   dextrose 25 mL PRN   fentaNYL 12.5 mcg Q4H PRN   glucagon (rDNA) 1 mg PRN   glucagon (rDNA) 1 mg PRN   glucagon (rDNA) 1 mg PRN   hydrALAZINE 10 mg Q6H PRN   insulin aspart 1-3 Units QHS PRN   insulin aspart 1-5 Units PRN   insulin aspart 1-5 Units TID AC PRN   naloxone 0.2 mg PRN   polyethylene glycol 17 g QD PRN           Review of Systems:   A comprehensive review of systems was: General ROS:[x]    negative other than :   Respiratory ROS:[]  cough,[] shortness of breath,  []  wheezing  Cardiovascular ROS:  []  chest pain []  dyspnea on exertion  Gastrointestinal ROS: []  abdominal pain, [] change in bowel habits,  [] black/ bloody stools  Genito-Urinary ROS: []  dysuria,[]  trouble voiding,[]  hematuria  Musculoskeletal ROS:[]  negative  Neurological ROS:[x]  anxiety    Physical Exam:     Filed  Vitals:    05/02/14 0700   BP:    Pulse: 90   Temp:    Resp: 16   SpO2: 98%       Intake and Output Summary (Last 24 hours) at Date Time    Intake/Output Summary (Last 24 hours) at 05/02/14 1129  Last data filed at 05/02/14 0600   Gross per 24 hour   Intake    240 ml   Output    850 ml   Net   -610 ml       General appearance [x]  alert, [] , mildly anxious,  [x] no distress  Eyes -[x]  pupils equal and[x] reactive, -[]   extraocular eye movements intact  Mouth -[x]  mucous membranes moist, [] pharynx normal without lesions  Neck -[x]   supple,  [] no adenopathy, [x] no JVD,[] Thyromegaly.  Lymphatics -[]  no palpable lymphadenopathy  Chest - [x]  on 1 L O2 saturation 98 percent, []  wheezes, [] rales[x]  few rhonchi, [x] symmetric air entry  Heart - [x] normal rate, [x] regular rhythm, [x] normal S1, S2, []   murmurs,[]  rubs, []   gallops  Abdomen - [x] soft,[x]  nontender,[x]  nondistended,[]  no masses[]  organomegaly  Neurological - [x]  , awake and alert,[x]  oriented x3,[]  normal speech,[x]  no focal findings or movement disorder noted  Musculoskeletal - []  joint tenderness,[]  deformity [] swelling  Extremities -[x]  peripheral pulses normal,[]   pedal edema,[]  clubbing []  cyanosis  Skin - [] normal coloration and turgor, []  rashes,[]   skin lesions noted                Labs:     Results     Procedure Component Value Units Date/Time    Glucose Whole Blood - POCT [[id number]] Collected:  05/02/14 0752     POCT - Glucose Whole blood 96 mg/dL Updated:  40/98/11 9147    Troponin I [[id number]] Collected:  05/02/14 0157    Specimen Information:  Blood Updated:  05/02/14 0258     Troponin I 0.09 ng/mL     B-type Natriuretic Peptide [[id number]]  (Abnormal) Collected:  05/02/14 0157    Specimen Information:  Blood Updated:  05/02/14 0252     B-Natriuretic Peptide 3642 (H) pg/mL     Magnesium [[id number]] Collected:  05/02/14 0157    Specimen Information:  Blood Updated:  05/02/14 0252     Magnesium 1.9 mg/dL     Phosphorus [[id number]] Collected:  05/02/14  0157    Specimen Information:  Blood Updated:  05/02/14 0252     Phosphorus 2.6 mg/dL     Hemolysis index [[id number]] Collected:  05/02/14 0157     Hemolysis Index 3 Updated:  05/02/14 0252    GFR [[id number]] Collected:  05/02/14 0157     EGFR 32.1 Updated:  05/02/14 0252    Comprehensive metabolic panel [[id number]]  (Abnormal) Collected:  05/02/14 0157    Specimen Information:  Blood Updated:  05/02/14 0252     Glucose 98 mg/dL      BUN 88.4 (H) mg/dL      Creatinine 1.9 (H) mg/dL      Sodium 166 mEq/L      Potassium 3.6 mEq/L      Chloride 103 mEq/L      CO2 28 mEq/L      CALCIUM 8.2 (L) mg/dL      Protein, Total 6.0 g/dL      Albumin 2.6 (L) g/dL      AST (SGOT) 16 U/L      ALT 17 U/L      Alkaline Phosphatase 98 U/L      Bilirubin, Total 0.4 mg/dL      Globulin 3.4 g/dL      Albumin/Globulin Ratio 0.8 (L)      Anion Gap 9.0     CBC and differential [[id number]]  (Abnormal) Collected:  05/02/14 0157    Specimen Information:  Blood / Blood Updated:  05/02/14 0234     WBC 5.44 x10 3/uL      Hgb 8.8 (L) g/dL      Hematocrit 93.2 (L) %      Platelets 213 x10 3/uL      RBC 3.25 (L) x10 6/uL      MCV 88.6 fL      MCH 27.1 (L) pg      MCHC 30.6 (L) g/dL      RDW 19 (H) %      MPV 10.0 fL      Neutrophils 50 %      Lymphocytes Automated 30 %      Monocytes 14 %      Eosinophils Automated  6 %      Basophils Automated 0 %      Immature Granulocyte 0 %      Nucleated RBC 0 /100 WBC      Neutrophils Absolute 2.70 x10 3/uL      Abs Lymph Automated 1.61 x10 3/uL      Abs Mono Automated 0.78 x10 3/uL      Abs Eos Automated 0.34 x10 3/uL      Absolute Baso Automated 0.01 x10 3/uL      Absolute Immature Granulocyte 0.02 x10 3/uL     Glucose Whole Blood - POCT [[id number]]  (Abnormal) Collected:  05/01/14 2219     POCT - Glucose Whole blood 145 (H) mg/dL Updated:  40/98/11 9147    Glucose Whole Blood - POCT [[id number]] Collected:  04/30/14 1746     POCT - Glucose Whole blood 82 mg/dL Updated:  86/57/84 6962    Glucose Whole Blood -  POCT [[id number]]  (Abnormal) Collected:  05/01/14 1621     POCT - Glucose Whole blood 175 (H) mg/dL Updated:  40/10/27 2536          Rads:     Radiology Results (24 Hour)     Procedure Component Value Units Date/Time    XR Chest AP Portable [[id number]] Collected:  05/01/14 1221    Order Status:  Completed Updated:  05/01/14 1227    Narrative:      INDICATION: Dyspnea.    FINDINGS: Single AP view of the chest compared to 04/28/2014  demonstrates no significant interval change in small right pleural  effusion and right basilar subsegmental atelectasis. There is stable  mild interstitial prominence. Cardiomegaly is again noted. No  pneumothorax demonstrated.      Impression:       No significant interval change.    Gustavus Messing, MD   05/01/2014 12:23 PM              Assessment/Plan:     Severe bradyarrhythmias: Junctional rhythm, likely metabolic, resolved, off beta blocker and with hemodialysis.  Now in normal sinus rhythm.  No need for pacemaker placement..    Encephalopathy: Acute, metabolic.  Resolved, currently alert and oriented.    Accelerated hypertension: On multiple medication, we will add clonidine, and observe for possible bradycardia.  Hold off on ACE inhibitor/ARB considering renal insufficiency..    Anemia: Seems to be chronic, overall stable hematocrit, .  No need for transfusion.    Renal insufficiency: Acute on chronic, currently on hemodialysis, improving urine output.  Not sure if patient will be a long-term hemodialysis candidate.  Has hemodialysis catheter in the femoral area.    Anxiety: Apparently chronic, we will start on when necessary Xanax.    Diabetes mellitus: Continue Accu-Chek, fairly controlled.    Debility: PT, OT as tolerated.    Stable to transfer out of the ICU.    Critical care time 35 minutes.        Signed by: Margarite Gouge, MD

## 2014-05-02 NOTE — Discharge Barriers (Incomplete)
05/02/2014/10:08 AM    Admit Dx: bradycardia        Sepsis Screen: negative    Pain:  No            Sedation:  No    DVT Prophylaxis: low molecular weight heparin and SCD's while in bed    GI Prophylaxis: Not Indicated    Integumentary: wocn orders in     Diet:  General Diet renal cho    BGL < 180: Yes        Glucose Monitoring: Yes    Antibiotic Day #: 0    Restraint Need Reviewed: No    Central Line Necessity Reviewed: Yes    PT/OT: Yes     Early Mobilization Category: 5    Proxy/Decision Maker Identified: Child    Palliative Care: No    [facility]RTC Notification Triggers: No                                    Notified: No  Plan: control BP anxiety medication transfer later perhaps    Participants:     Physician: {[id number]}                             Nursing: {YES (DEF)/NO:21773}                             Pharmacy: {YES (DEF)/NO:21773}                             Nutrition: {YES (DEF)/NO:21773}                             Case Management: {YES (DEF)/NO:21773}                             Respiratory: {YES (DEF)/NO:21773}                             Palliative Care: {YES (DEF)/NO:21773}                             PT/OT: {YES (DEF)/NO:21773}  Signed by: Kary Kos  Date/Time: 05/02/2014 10:08 AM

## 2014-05-03 ENCOUNTER — Inpatient Hospital Stay: Payer: Medicare Other

## 2014-05-03 DIAGNOSIS — I2510 Atherosclerotic heart disease of native coronary artery without angina pectoris: Secondary | ICD-10-CM | POA: Insufficient documentation

## 2014-05-03 LAB — COMPREHENSIVE METABOLIC PANEL
ALT: 14 U/L (ref 0–55)
AST (SGOT): 16 U/L (ref 5–34)
Albumin/Globulin Ratio: 0.7 — ABNORMAL LOW (ref 0.9–2.2)
Albumin: 2.3 g/dL — ABNORMAL LOW (ref 3.5–5.0)
Alkaline Phosphatase: 83 U/L (ref 37–106)
Anion Gap: 10 (ref 5.0–15.0)
BUN: 24 mg/dL — ABNORMAL HIGH (ref 7.0–19.0)
Bilirubin, Total: 0.4 mg/dL (ref 0.2–1.2)
CO2: 25 mEq/L (ref 22–29)
Calcium: 8.1 mg/dL — ABNORMAL LOW (ref 8.5–10.5)
Chloride: 106 mEq/L (ref 100–111)
Creatinine: 1.6 mg/dL — ABNORMAL HIGH (ref 0.6–1.0)
Globulin: 3.2 g/dL (ref 2.0–3.6)
Glucose: 87 mg/dL (ref 70–100)
Potassium: 3.4 mEq/L — ABNORMAL LOW (ref 3.5–5.1)
Protein, Total: 5.5 g/dL — ABNORMAL LOW (ref 6.0–8.3)
Sodium: 141 mEq/L (ref 136–145)

## 2014-05-03 LAB — CBC AND DIFFERENTIAL
Basophils Absolute Automated: 0.01 10*3/uL (ref 0.00–0.20)
Basophils Automated: 0 %
Eosinophils Absolute Automated: 0.39 10*3/uL (ref 0.00–0.70)
Eosinophils Automated: 9 %
Hematocrit: 27.3 % — ABNORMAL LOW (ref 37.0–47.0)
Hgb: 8.3 g/dL — ABNORMAL LOW (ref 12.0–16.0)
Immature Granulocytes Absolute: 0.01 10*3/uL
Immature Granulocytes: 0 %
Lymphocytes Absolute Automated: 1.32 10*3/uL (ref 0.50–4.40)
Lymphocytes Automated: 30 %
MCH: 26.9 pg — ABNORMAL LOW (ref 28.0–32.0)
MCHC: 30.4 g/dL — ABNORMAL LOW (ref 32.0–36.0)
MCV: 88.6 fL (ref 80.0–100.0)
MPV: 9.5 fL (ref 9.4–12.3)
Monocytes Absolute Automated: 0.66 10*3/uL (ref 0.00–1.20)
Monocytes: 15 %
Neutrophils Absolute: 2.06 10*3/uL (ref 1.80–8.10)
Neutrophils: 46 %
Nucleated RBC: 0 /100 WBC (ref 0–1)
Platelets: 188 10*3/uL (ref 140–400)
RBC: 3.08 10*6/uL — ABNORMAL LOW (ref 4.20–5.40)
RDW: 19 % — ABNORMAL HIGH (ref 12–15)
WBC: 4.44 10*3/uL (ref 3.50–10.80)

## 2014-05-03 LAB — B-TYPE NATRIURETIC PEPTIDE: B-Natriuretic Peptide: 2702 pg/mL — ABNORMAL HIGH (ref 0–100)

## 2014-05-03 LAB — GLUCOSE WHOLE BLOOD - POCT
Whole Blood Glucose POCT: 76 mg/dL (ref 70–100)
Whole Blood Glucose POCT: 135 mg/dL — ABNORMAL HIGH (ref 70–100)
Whole Blood Glucose POCT: 123 mg/dL — ABNORMAL HIGH (ref 70–100)
Whole Blood Glucose POCT: 151 mg/dL — ABNORMAL HIGH (ref 70–100)

## 2014-05-03 LAB — HEMOLYSIS INDEX: Hemolysis Index: 3 (ref 0–18)

## 2014-05-03 LAB — HEPATITIS B E ANTIBODY: Hepatitis Be Antibody: NONREACTIVE

## 2014-05-03 LAB — TROPONIN I: Troponin I: 0.07 ng/mL (ref 0.00–0.09)

## 2014-05-03 LAB — PHOSPHORUS: Phosphorus: 2 mg/dL — ABNORMAL LOW (ref 2.3–4.7)

## 2014-05-03 LAB — URIC ACID: Uric acid: 5.8 mg/dL (ref 2.6–6.0)

## 2014-05-03 LAB — GFR: EGFR: 39.1

## 2014-05-03 LAB — MAGNESIUM: Magnesium: 1.7 mg/dL (ref 1.6–2.6)

## 2014-05-03 MED ORDER — ZOLPIDEM TARTRATE 5 MG PO TABS
5.0000 mg | ORAL_TABLET | Freq: Every evening | ORAL | Status: DC | PRN
Start: 2014-05-03 — End: 2014-05-16
  Administered 2014-05-03 – 2014-05-04 (×2): 5 mg via ORAL
  Filled 2014-05-03 (×2): qty 1

## 2014-05-03 MED ORDER — ALBUTEROL-IPRATROPIUM 2.5-0.5 (3) MG/3ML IN SOLN
3.0000 mL | Freq: Four times a day (QID) | RESPIRATORY_TRACT | Status: DC
Start: 2014-05-03 — End: 2014-05-16
  Administered 2014-05-03 – 2014-05-15 (×26): 3 mL via RESPIRATORY_TRACT
  Filled 2014-05-03 (×30): qty 3

## 2014-05-03 NOTE — Plan of Care (Signed)
Problem: Renal Failure  Goal: Fluid and electrolyte balance are achieved/maintained  Pt. Had   Intervention: Monitor intake and output every shift  Pt. Had 200cc of water during dinner and 60cc of water  With meds, 450cc of amber color  urine output noted.

## 2014-05-03 NOTE — Progress Notes (Signed)
PROGRESS NOTE    Date Time: 05/03/2014 10:46 AM  Patient Name: [name], [name]      Assessment/Plan:   Copd exacerbation- will not resume spiriva as may have had uropathy, now on flomax, will add duoneb, and cont advair.   Pneumonia vs bronchitis - invanz, will order procalcitonin  ATN, AKI on CKD stage 4- nephrology following, holding HD increase UOP kidneys may be recovering  HTN- hydralazine norvasc and clonidine, avoiding arb/ace i while aki. Cont hytrin  UTI proteus, MDR  Anemia of CKD- aranesp  Bradycardia now in sinus resolved  CAD, hx des - cardiology following, on asa and avoiding AV nodal blocking agents due to bradycardia. On imdur  DM- controlled on correctional    Subjective:   Review of Systems - patient feeling cold, no dyspnea, but still feels malaise    Medications:     Current Facility-Administered Medications   Medication Dose Route Frequency   . amLODIPine  10 mg Oral Daily   . aspirin EC  81 mg Oral Daily   . atorvastatin  10 mg Oral QHS   . calcitRIOL  0.25 mcg Oral Daily   . cloNIDine  0.2 mg Oral BID   . darbepoetin alfa (ARANESP) injection  40 mcg Subcutaneous Weekly   . docusate sodium  100 mg Oral Daily   . fluticasone-salmeterol  1 puff Inhalation BID   . heparin (porcine)  5,000 Units Subcutaneous Q12H [facility]   . hydrALAZINE  100 mg Oral Q8H [facility]   . isosorbide mononitrate  60 mg Oral Daily   . terazosin  5 mg Oral QHS   . venlafaxine  25 mg Oral BID       Physical Exam:     Filed Vitals:    05/03/14 1004   BP: 163/64   Pulse: 87   Temp:    Resp:    SpO2:        Intake and Output Summary (Last 24 hours) at Date Time    Intake/Output Summary (Last 24 hours) at 05/03/14 1046  Last data filed at 05/03/14 0100   Gross per 24 hour   Intake    600 ml   Output    750 ml   Net   -150 ml       General Appearance:  alert, well appearing, and in no distress  Mental status:  alert, oriented to person, place, and time  Neuro:  alert, oriented, normal speech, no focal findings or movement disorder  noted  Neck:supple, no significant adenopathy  Lungs: decreased air entry noted but clear  Cardiac: normal rate, regular rhythm, normal S1, S2, no murmurs, rubs, clicks or gallops  Abdomen:  soft, nontender, nondistended, no masses or organomegaly  Extremities: pedal edema R>[name] + , hx femoral artery stenosis right  Skin:   Wound right foot, wound care following  GU:       Incontinent, voids    Labs:     Results     Procedure Component Value Units Date/Time    Glucose Whole Blood - POCT [[id number]] Collected:  05/03/14 0840     POCT - Glucose Whole blood 76 mg/dL Updated:  24/40/10 2725    Hepatitis B e antibody [[id number]] Collected:  04/28/14 4259    Specimen Information:  Blood Updated:  05/03/14 0731     Hepatitis Be AB Nonreactive     MRSA culture [[id number]] Collected:  05/03/14 0205    Specimen Information:  Body Fluid / Nares and Throat Updated:  05/03/14 0459    Troponin I [[id number]] Collected:  05/03/14 0205    Specimen Information:  Blood Updated:  05/03/14 0250     Troponin I 0.07 ng/mL     B-type Natriuretic Peptide [[id number]]  (Abnormal) Collected:  05/03/14 0205    Specimen Information:  Blood Updated:  05/03/14 0250     B-Natriuretic Peptide 2702 (H) pg/mL     Hemolysis index [[id number]] Collected:  05/03/14 0205     Hemolysis Index 3 Updated:  05/03/14 0245    GFR [[id number]] Collected:  05/03/14 0205     EGFR 39.1 Updated:  05/03/14 0245    Magnesium [[id number]] Collected:  05/03/14 0205    Specimen Information:  Blood Updated:  05/03/14 0245     Magnesium 1.7 mg/dL     Phosphorus [[id number]]  (Abnormal) Collected:  05/03/14 0205    Specimen Information:  Blood Updated:  05/03/14 0245     Phosphorus 2.0 ([name]) mg/dL     Uric acid [[id number]] Collected:  05/03/14 0205    Specimen Information:  Blood Updated:  05/03/14 0245     Uric acid 5.8 mg/dL     Comprehensive metabolic panel [[id number]]  (Abnormal) Collected:  05/03/14 0205    Specimen Information:  Blood Updated:  05/03/14 0245     Glucose 87  mg/dL      BUN 82.4 (H) mg/dL      Creatinine 1.6 (H) mg/dL      CALCIUM 8.1 ([name]) mg/dL      Sodium 235 mEq/[name]      Potassium 3.4 ([name]) mEq/[name]      Chloride 106 mEq/[name]      CO2 25 mEq/[name]      Anion Gap 10.0      Protein, Total 5.5 ([name]) g/dL      Albumin 2.3 ([name]) g/dL      AST (SGOT) 16 U/[name]      ALT 14 U/[name]      Alkaline Phosphatase 83 U/[name]      Bilirubin, Total 0.4 mg/dL      Globulin 3.2 g/dL      Albumin/Globulin Ratio 0.7 ([name])     CBC and differential [[id number]]  (Abnormal) Collected:  05/03/14 0205    Specimen Information:  Blood / Blood Updated:  05/03/14 0222     WBC 4.44 x10 3/uL      Hgb 8.3 ([name]) g/dL      Hematocrit 00.8 ([name]) %      Platelets 188 x10 3/uL      RBC 3.08 ([name]) x10 6/uL      MCV 88.6 fL      MCH 26.9 ([name]) pg      MCHC 30.4 ([name]) g/dL      RDW 19 (H) %      MPV 9.5 fL      Neutrophils 46 %      Lymphocytes Automated 30 %      Monocytes 15 %      Eosinophils Automated 9 %      Basophils Automated 0 %      Immature Granulocyte 0 %      Nucleated RBC 0 /100 WBC      Neutrophils Absolute 2.06 x10 3/uL      Abs Lymph Automated 1.32 x10 3/uL      Abs Mono Automated 0.66 x10 3/uL      Abs Eos Automated 0.39 x10 3/uL      Absolute Baso Automated 0.01 x10 3/uL  Absolute Immature Granulocyte 0.01 x10 3/uL     Glucose Whole Blood - POCT [[id number]] Collected:  05/02/14 2310     POCT - Glucose Whole blood 95 mg/dL Updated:  03/26/30 3557    Glucose Whole Blood - POCT [[id number]]  (Abnormal) Collected:  05/02/14 1600     POCT - Glucose Whole blood 117 (H) mg/dL Updated:  09/08/74 2831    Glucose Whole Blood - POCT [[id number]]  (Abnormal) Collected:  05/02/14 1226     POCT - Glucose Whole blood 107 (H) mg/dL Updated:  71/06/26 9485          Rads:   Echocardiogram Adult Complete W Clr/ Dopp Waveform    04/26/2014   HISTORY/INDICATION:  CHF  Transthoracic echocardiography was performed using standard 2-dimensional views, M-mode, and color and spectral Doppler.  Quality of the study:  Technically limited study Technologist: NI  height: 61 in, weight: 176 LBS, blood pressure: 171/115 mmHg  DIMENSIONS (normal values in parentheses): 3.0 cm --  Aorta( 2.0-3.7) 4.8 cm --  Left atrium(1.9-4.0) 4.9 cm --  Left ventricular diastolic diameter(3.4-5.6) 3.4 cm --  Left ventricular systolic diameter (2.0-3.8) 2.8 cm --  Right ventricular diameter (0.9-2.6) 1.4 cm --  Interventricular septum (0.6-1.1) 1.3 cm --  Posterior wall (0.6-1.1)  VELOCITIES (normal values in parentheses): 1.3 m/sec --  Mitral valve (0.6-1.3) 2.8 m/sec --  Aortic valve (1.0-1.7) 1.2 m/sec --  Pulmonic valve (0.6-0.9) 0.4 m/sec --  Tricuspid valve (0.3-0.7) 1.0 m/sec --  LV outflow tract (0.7-1.1)  50-55 % --  Left ventricular ejection fraction  E to A wave ratio: NA.  Deceleration time 246 ms.  lateral E prime NA cm/s  FINDINGS:  Left ventricle:  normal size, mild to moderate hypertrophy, normal systolic function, there is paradoxical septal motion secondary to postsurgical state, indeterminate diastolic function.  Right ventricle:  normal size, normal contractility, right ventricular systolic pressure of 57 mmHg (with estimated RA pressure of 10 mmHg)  Left atrium:  Moderately dilated  Right atrium:  Dilated  Mitral valve:  Mild annular calcification, mild sclerosis, no stenosis, trace regurgitation  Aortic valve:  Trileaflet, calcified with reduced opening, mean pressure gradient 14 mmHg, maximum pressure gradient 30 mmHg consistent with mild aortic stenosis, no regurgitation is present.  Tricuspid valve:  normal morphology, no stenosis, moderate regurgitation  Pulmonic valve:  Not well visualized, trace regurgitation  Pericardium:  Trace effusion with no evidence of hemodynamic compromise  Aorta:  Normal size aortic root  Other: No intracardiac mass or thrombus seen, no ASD nor PFO seen     04/26/2014    1. Technically limited study 2. Normal left ventricular size and systolic function, estimated EF 50-55% 3. Mild to moderate left ventricular hypertrophy 4. Mild aortic  stenosis 5. Moderate tricuspid regurgitation 6. Moderate pulmonary hypertension 7. Moderately dilated left atrium  Compared to previous study 03/01/2014 the ejection fraction has normalized.  Kyra Searles, MD  04/26/2014 3:03 PM     Xr Chest 2 Views    04/27/2014   CLINICAL INDICATION: dyspnea chf infiltrates post Lasix  COMPARISON: 04/26/2014  INTERPRETATION: Frontal and lateral views of the chest were obtained..  There is stable cardiomegaly. There is mildly improving interstitial edema. There is a small right pleural effusion. Post sternotomy wires are noted.     04/27/2014    Mildly improving interstitial edema. There is a stable small right pleural effusion.   [name], MD  04/27/2014 9:04 AM     Xr  Chest 2 Views    04/26/2014   INDICATION: Shortness of breath recent lung infiltrate  COMPARISON: 04/23/2013  FINDINGS: PA and lateral views of the chest were obtained.  The heart is enlarged in size. Median sternotomy The  mediastinum and hilar structures are unremarkable.  The lung fields demonstrate some improvement in the right lower lung infiltrate. Infiltrate and moderate size right effusion remain. New left perihilar infiltrate. No pneumothorax. The visualized osseous structures demonstrate no acute abnormality.      04/26/2014    New left perihilar infiltrate. Question aspiration or fluid overload. Improving right lower lung infiltrate. Stable right effusion  Kinnie Feil, MD  04/26/2014 9:23 AM     US Renal Kidney    04/25/2014   Retroperitoneal ultrasound   CLINICAL HISTORY: AKI  TECHNIQUE: 2D and grayscale ultrasound imaging is performed with attention to the kidneys and the pelvis. Examination is performed portably and is technically challenging.  INTERPRETATION: The right and left kidneys measure 10.2 cm and 9.7 cm respectively. There is no significant hydronephrosis. Echo somewhat hyperechoic kidneys are noted. There is a cyst in the upper pole left kidney measuring 2.7 x 1.9 cm in diameter.   The  bladder is incompletely filled . The pre-void volume is  302 cc. Post void residual 79 cc or  26%; this is somewhat increased but of borderline significance and questionable reliability given ICU setting and portable exam  Incidental note is made of ascites.     04/25/2014   Impression: Somewhat increased echogenicity of kidneys. No evidence of hydronephrosis. Ascites. Question increased post void residual but of uncertain significance given portable examination.  Dara Lords, MD  04/25/2014 4:06 PM     Xr Chest Ap Portable    05/01/2014   INDICATION: Dyspnea.  FINDINGS: Single AP view of the chest compared to 04/28/2014 demonstrates no significant interval change in small right pleural effusion and right basilar subsegmental atelectasis. There is stable mild interstitial prominence. Cardiomegaly is again noted. No pneumothorax demonstrated.     05/01/2014    No significant interval change.  Gustavus Messing, MD  05/01/2014 12:23 PM     Xr Chest Ap Portable    04/28/2014   INDICATION: Shortness of breath COMPARISON: 04/27/2014  FINDINGS:  A single radiograph of the chest performed. Portable film. The heart is enlarged in size. Median sternotomy The mediastinal and hilar structures are within normal limits. The lung fields demonstrates right lower lung atelectasis or infiltrate and right effusion which is stable.Marland Kitchen No pneumothorax.   The  visualized osseous structures demonstrates no acute abnormality.     04/28/2014    Stable right effusion and adjacent edema.  Kinnie Feil, MD  04/28/2014 9:44 AM     Xr Chest Ap Portable    04/25/2014   INDICATION: Congestive heart failure  COMPARISON: 04/22/2014  FINDINGS:  A single radiograph of the chest performed. Portable film. The heart is enlarged in size. Median sternotomy The mediastinal and hilar structures are within normal limits. The lung fields demonstrates stable right pleural effusion. Adjacent right lower lung subsegmental atelectasis or infiltrate.Marland Kitchen No pneumothorax.   The   visualized osseous structures demonstrates no acute abnormality.     04/25/2014    Stable right lower lung effusion and adjacent infiltrate or atelectasis.  Kinnie Feil, MD  04/25/2014 9:19 AM     Chest Ap Portable    04/22/2014   History: chest pain  Technique: Single Portable View  Comparison: 03/10/2014  Findings: There is focal opacity at the right lung base most likely representing a combination of a moderate right pleural effusion and associated passive right lower lobe atelectasis.There is no pneumothorax.  There is marked cardiomegaly.     The mediastinum is within normal limits.  Patient status post sternotomy     04/22/2014    Right new pleural effusion. 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  POSTOPERATIVE DIAGNOSIS: Same.  OPERATOR: Hulda Marin, MD.  ACCESS: Left common femoral artery under ultrasound guidance.  CLOSURE: Manual pressure for hemostasis.  VESSELS INJECTED: 1. Abdominal aorta. 2. Distal abdominal aorta for pelvic arteriogram. 3. Right common iliac artery for runoff arteriogram. 4. Left common femoral artery.  FLUOROSCOPY TIME: 4.3 minutes.  ANESTHESIA: Local 1% lidocaine. IV sedation with Versed and Fentanyl with continuous cardiopulmonary monitoring throughout the procedure by a qualified independent nurse for moderate sedation purposes. Total time of monitored conscious sedation is 30 minutes.  ESTIMATED BLOOD LOSS: Minimal.  COMPLICATIONS: None immediate/apparent.  HISTORY/INDICATION: ZANIA KALISZ is a 64 years-old Female with known severe peripheral arterial disease status post bilateral iliac artery stenting as well as right SFA stenting. Patient also  has chronic right leg swelling. Patient continues to have poor healing of her medial right ankle wound. Despite seemingly good perfusion on the most recent noninvasive arterial exam from 03/03/2014, and duplex imaging from the same setting raised question of a hemodynamically significant stenosis of the distal right external iliac artery. Discussion was held with Dr. Murrell Converse including consideration of a mixed arteriovenous ulcer. Following this discussion, the patient is referred for angiography and possible endovascular intervention to potentially improve arterial supply to the wound.  COMPARISON: Noninvasive arterial examination of the lower extremities with arterial duplex ultrasound 03/03/2014.  PROCEDURE: The risks, benefits and alternatives were discussed in detail and informed written consent was obtained from the patient after all questions were answered. The patient was prehydrated due to her known CKD 3. The patient was brought to the angiography suite and correctly identified using Time-Out protocol and placed on the table in a supine position. The groins were prepped and draped in usual sterile fashion. Maximum sterile barrier techniques were utilized. IV sedation was administered. Under ultrasound guidance and following local anesthesia and a skin incision, the left common femoral artery was accessed with a micropuncture set and over an 035 Amplatz, a 5 French sheath was placed. Over an 035 J-wire, a 5 French flush pigtail catheter was placed in the abdominal aorta. Abdominal aortogram in AP projection was performed with CO2 angiography. The catheter was then withdrawn into the distal abdominal aorta and pelvic arteriogram and bilateral oblique projections was performed with CO2 angiography. The catheter was then manipulated over the iliac bifurcation into the right common iliac artery over an 035 Advantage Glidewire. Right lower extremity runoff angiography was performed utilizing iodinated contrast. A  total of 21 mL of Visipaque was utilized. Following this, pullback pressures across the iliac bifurcation were obtained demonstrating no significant gradient. The catheter was removed without complication. In LAO projection, left common femoral arteriogram was performed through the access sheath. The sheath was removed and manual pressure was applied and hemostasis  was obtained. The skin was cleaned and a sterile dressing was applied. Overall, the patient tolerated the procedure well without immediate/apparent complications and left in stable condition. She was recovered in the PACU and had additional postoperative IV hydration.  FINDINGS: 1. Ultrasound demonstrates a patent left common femoral artery with diffuse mainly calcified atherosclerotic plaque especially along the posterior wall. An ultrasound image documenting the micropuncture needle tip in the lumen of the artery was obtained and stored for the medical record. Subsequent left common femoral arteriogram confirms access in the lower CFA at the level of the femoral head and above the femoral bifurcation with diffuse atherosclerotic disease. 2. Abdominal aortogram demonstrates diffuse calcified atherosclerotic plaque without aneurysm, stenosis or occlusion. There is mild ectasia of the infrarenal abdominal aorta just above the iliac bifurcation. 3. Pelvic arteriogram demonstrates patent bilateral common and external iliac arteries with no significant stenoses. The bilateral iliac artery stents extending from the common iliac artery into the external iliac artery bilaterally are patent. On the right, there is a stent graft in place with coverage of the internal iliac artery origin; on the left, there is a bare-metal stent in place extending across the internal iliac artery origin. The internal iliac arteries are not clearly opacified though it is expected that the left internal iliac artery should be patent. 4. The right common and external iliac arteries  are patent with no hemodynamically significant stenosis identified. Pullback pressures across the iliac bifurcation do not demonstrate significant common iliac artery stenosis. Moderate calcified atherosclerotic plaque is noted at the distal right external iliac artery and especially in the right femoral arteries. 5. Right lower extremity runoff angiogram demonstrates patency of the right common femoral artery with diffuse calcified atherosclerotic plaque without a significant stenosis. The right profunda femoris artery and its major branches are patent with scattered calcified atherosclerotic plaque. The right superficial femoral artery is patent with mild focal atherosclerotic plaque with approximately 30% stenosis just proximal to the indwelling stents. The right SFA stents are widely patent no significant in-stent or distal stenosis. The right popliteal artery is patent with diffuse calcified atherosclerotic plaque without significant stenosis or occlusion. There is single vessel runoff into the right foot via a patent and hypertrophied right peroneal artery with fairly diffuse calcified atherosclerotic plaque noted. The right anterior tibial and posterior tibial arteries are completely occluded. The peroneal artery is essentially the same caliber as the popliteal artery. The peroneal artery ends at the ankle where it trifurcates into multiple well-formed collaterals which supply the distal most anterior tibial/dorsalis pedis arteries as well as the posterior tibial/plantar artery. Hyperemia at the right ankle is noted consistent with the patient's wound in this area.     04/04/2014    Aortoiliac and right lower extremity angiography do not demonstrate significant iliac inflow disease. There is good in-line flow with single vessel runoff to the right foot via the hypertrophied peroneal artery with note of hyperemia at the right ankle consistent with the wound in this area. Given these findings as well as the  noninvasive arterial exam findings from 03/03/2014, it appears that the patient should have adequate arterial flow for wound healing. The case and findings were discussed with the patient and Dr. Murrell Converse postoperatively.  Hulda Marin, MD  04/04/2014 6:22 PM     Triple Lumen Dialysis Cath    05/03/2014   Procedure: Ultrasound-guided temporary dialysis catheter placement.  Interventionalist: Kathrin Penner, MD  Indications: Renal insufficiency  Anesthesia: Local with 1% lidocaine. No  IV sedation utilized.  Technique: Following skin preparation and draping with maximum sterile barrier, local anesthesia was provided over the right common femoral vein with 1% lidocaine. Under direct ultrasound guidance access was gained to the right common femoral vein with a 19 gauge needle.  An image of needle access  was obtained for the medical record. Seldinger technique was utilized in placing a guidewire into the venous system. Serial fascial dilation was performed with final placement of a 20 cm triple-lumen temporary dialysis catheter into the venous system.  Both ports were noted to aspirate and irrigate freely. Appropriate heparinized solution was placed in each port. The catheter was sutured times 2 to the skin with 0 Prolene and a dressing applied. There were no apparent complications. A chest x-ray was ordered.  Findings: Ultrasound including the image stored for the medical record revealed a patent right common femoral vein for catheter placement. US guidance confirmed needle access to the vein.     05/03/2014   Impression: Technically successful US guided placement of a 20 cm triple-lumen temporary dialysis catheter via right common femoral vein. Following chest x-ray, the catheter will be ready for immediate use.  Georgiana Spinner, MD  05/03/2014 9:20 AM       Signed by: Esmond Camper

## 2014-05-03 NOTE — Progress Notes (Signed)
PROGRESS NOTE    Date Time: 05/03/2014 2:11 PM  Patient Name: [name], [name]      Subjective:   Overall, doing much better, which alert, denies any shortness of breath.  Stable vital signs, on 2 [name] O2 saturation 98 percent.  Labs noted with improving renal function, normal WBC, stable hematocrit.    Medications:      Scheduled Meds: PRN Meds:        albuterol-ipratropium 3 mL Nebulization Q6H SCH   amLODIPine 10 mg Oral Daily   aspirin EC 81 mg Oral Daily   atorvastatin 10 mg Oral QHS   calcitRIOL 0.25 mcg Oral Daily   cloNIDine 0.2 mg Oral BID   darbepoetin alfa (ARANESP) injection 40 mcg Subcutaneous Weekly   docusate sodium 100 mg Oral Daily   fluticasone-salmeterol 1 puff Inhalation BID   heparin (porcine) 5,000 Units Subcutaneous Q12H SCH   hydrALAZINE 100 mg Oral Q8H SCH   isosorbide mononitrate 60 mg Oral Daily   terazosin 5 mg Oral QHS   venlafaxine 25 mg Oral BID         Continuous Infusions:      acetaminophen 650 mg Q4H PRN   Or     acetaminophen 650 mg Q4H PRN   acetaminophen 650 mg Q4H PRN   ALPRAZolam 0.25 mg Q6H PRN   atropine 0.5 mg PRN   bisacodyl 10 mg QD PRN   dextrose 25 mL PRN   dextrose 25 mL PRN   dextrose 25 mL PRN   fentaNYL 12.5 mcg Q4H PRN   glucagon (rDNA) 1 mg PRN   glucagon (rDNA) 1 mg PRN   glucagon (rDNA) 1 mg PRN   hydrALAZINE 10 mg Q6H PRN   insulin aspart 1-3 Units QHS PRN   insulin aspart 1-5 Units PRN   insulin aspart 1-5 Units TID AC PRN   naloxone 0.2 mg PRN   polyethylene glycol 17 g QD PRN           Review of Systems:   A comprehensive review of systems was: General ROS:[x]    negative other than :   Respiratory ROS:[]  cough,[] shortness of breath,  []  wheezing  Cardiovascular ROS:  []  chest pain []  dyspnea on exertion  Gastrointestinal ROS: []  abdominal pain, [] change in bowel habits,  [] black/ bloody stools  Genito-Urinary ROS: []  dysuria,[]  trouble voiding,[]  hematuria  Musculoskeletal ROS:[]  negative  Neurological ROS:[x]  anxiety    Physical Exam:     Filed Vitals:    05/03/14  1113   BP: 143/63   Pulse: 76   Temp: 95 F (35 C)   Resp: 18   SpO2: 98%       Intake and Output Summary (Last 24 hours) at Date Time    Intake/Output Summary (Last 24 hours) at 05/03/14 1411  Last data filed at 05/03/14 0100   Gross per 24 hour   Intake    400 ml   Output    450 ml   Net    -50 ml       General appearance [x]  alert, [x] , looking well,  [] no distress  Eyes -[]  pupils equal and[] reactive, -[]   extraocular eye movements intact  Mouth -[x]  mucous membranes moist, [] pharynx normal without lesions  Neck -[x]   supple,  [] no adenopathy, [x] no JVD,[] Thyromegaly.  Lymphatics -[]  no palpable lymphadenopathy  Chest - [x]  on 2 [name] O2 saturation 98 percent, []  wheezes, [] rales[x]  few rhonchi, [x] symmetric air entry  Heart - [x] normal rate, [x] regular rhythm, [x] normal S1, S2, []   murmurs,[]  rubs, []   gallops  Abdomen - [x] soft,[x]  nontender,[x]  nondistended,[]  no masses[]  organomegaly  Neurological - [x]  , awake and alert,[x]  oriented x3,[]  normal speech,[x]  no focal findings or movement disorder noted  Musculoskeletal - []  joint tenderness,[]  deformity [] swelling  Extremities -[]  peripheral pulses normal,[]   pedal edema,[]  clubbing []  cyanosis  Skin - [] normal coloration and turgor, []  rashes,[]   skin lesions noted                Labs:     Results     Procedure Component Value Units Date/Time    Procalcitonin [[id number]] Collected:  05/03/14 0205     Updated:  05/03/14 1147    Glucose Whole Blood - POCT [[id number]]  (Abnormal) Collected:  05/03/14 1108     POCT - Glucose Whole blood 135 (H) mg/dL Updated:  78/29/56 2130    Glucose Whole Blood - POCT [[id number]] Collected:  05/03/14 0840     POCT - Glucose Whole blood 76 mg/dL Updated:  29/52/84 1324    Hepatitis B e antibody [[id number]] Collected:  04/28/14 2058    Specimen Information:  Blood Updated:  05/03/14 0731     Hepatitis Be AB Nonreactive     MRSA culture [[id number]] Collected:  05/03/14 0205    Specimen Information:  Body Fluid / Nares and Throat  Updated:  05/03/14 0459    Troponin I [[id number]] Collected:  05/03/14 0205    Specimen Information:  Blood Updated:  05/03/14 0250     Troponin I 0.07 ng/mL     B-type Natriuretic Peptide [[id number]]  (Abnormal) Collected:  05/03/14 0205    Specimen Information:  Blood Updated:  05/03/14 0250     B-Natriuretic Peptide 2702 (H) pg/mL     Hemolysis index [[id number]] Collected:  05/03/14 0205     Hemolysis Index 3 Updated:  05/03/14 0245    GFR [[id number]] Collected:  05/03/14 0205     EGFR 39.1 Updated:  05/03/14 0245    Magnesium [[id number]] Collected:  05/03/14 0205    Specimen Information:  Blood Updated:  05/03/14 0245     Magnesium 1.7 mg/dL     Phosphorus [[id number]]  (Abnormal) Collected:  05/03/14 0205    Specimen Information:  Blood Updated:  05/03/14 0245     Phosphorus 2.0 ([name]) mg/dL     Uric acid [[id number]] Collected:  05/03/14 0205    Specimen Information:  Blood Updated:  05/03/14 0245     Uric acid 5.8 mg/dL     Comprehensive metabolic panel [[id number]]  (Abnormal) Collected:  05/03/14 0205    Specimen Information:  Blood Updated:  05/03/14 0245     Glucose 87 mg/dL      BUN 69.4 (H) mg/dL      Creatinine 1.6 (H) mg/dL      CALCIUM 8.1 ([name]) mg/dL      Sodium 854 mEq/[name]      Potassium 3.4 ([name]) mEq/[name]      Chloride 106 mEq/[name]      CO2 25 mEq/[name]      Anion Gap 10.0      Protein, Total 5.5 ([name]) g/dL      Albumin 2.3 ([name]) g/dL      AST (SGOT) 16 U/[name]      ALT 14 U/[name]      Alkaline Phosphatase 83 U/[name]      Bilirubin, Total 0.4 mg/dL      Globulin 3.2 g/dL      Albumin/Globulin Ratio 0.7 ([name])     CBC and  differential [[id number]]  (Abnormal) Collected:  05/03/14 0205    Specimen Information:  Blood / Blood Updated:  05/03/14 0222     WBC 4.44 x10 3/uL      Hgb 8.3 ([name]) g/dL      Hematocrit 40.9 ([name]) %      Platelets 188 x10 3/uL      RBC 3.08 ([name]) x10 6/uL      MCV 88.6 fL      MCH 26.9 ([name]) pg      MCHC 30.4 ([name]) g/dL      RDW 19 (H) %      MPV 9.5 fL      Neutrophils 46 %      Lymphocytes Automated 30 %      Monocytes 15 %       Eosinophils Automated 9 %      Basophils Automated 0 %      Immature Granulocyte 0 %      Nucleated RBC 0 /100 WBC      Neutrophils Absolute 2.06 x10 3/uL      Abs Lymph Automated 1.32 x10 3/uL      Abs Mono Automated 0.66 x10 3/uL      Abs Eos Automated 0.39 x10 3/uL      Absolute Baso Automated 0.01 x10 3/uL      Absolute Immature Granulocyte 0.01 x10 3/uL     Glucose Whole Blood - POCT [[id number]] Collected:  05/02/14 2310     POCT - Glucose Whole blood 95 mg/dL Updated:  95/62/13 0865    Glucose Whole Blood - POCT [[id number]]  (Abnormal) Collected:  05/02/14 1600     POCT - Glucose Whole blood 117 (H) mg/dL Updated:  28/41/32 4401          Rads:     Radiology Results (24 Hour)     Procedure Component Value Units Date/Time    Triple Lumen Dialysis Cath [[id number]] Collected:  05/03/14 0920    Order Status:  Completed Updated:  05/03/14 0925    Narrative:      Procedure: Ultrasound-guided temporary dialysis catheter placement.    Interventionalist: Kathrin Penner, MD    Indications: Renal insufficiency    Anesthesia: Local with 1% lidocaine. No IV sedation utilized.    Technique: Following skin preparation and draping with maximum sterile  barrier, local anesthesia was provided over the right common femoral  vein with 1% lidocaine. Under direct ultrasound guidance access was  gained to the right common femoral vein with a 19 gauge needle.  An  image of needle access  was obtained for the medical record. Seldinger  technique was utilized in placing a guidewire into the venous system.  Serial fascial dilation was performed with final placement of a 20 cm  triple-lumen temporary dialysis catheter into the venous system.  Both  ports were noted to aspirate and irrigate freely. Appropriate  heparinized solution was placed in each port. The catheter was sutured  times 2 to the skin with 0 Prolene and a dressing applied. There were no  apparent complications. A chest x-ray was ordered.    Findings: Ultrasound  including the image stored for the medical record  revealed a patent right common femoral vein for catheter placement. US  guidance confirmed needle access to the vein.      Impression:      Impression: Technically successful US guided placement of a 20 cm  triple-lumen temporary dialysis catheter via right common femoral vein.  Following chest  x-ray, the catheter will be ready for immediate use.    Georgiana Spinner, MD   05/03/2014 9:20 AM              Assessment/Plan:     Severe bradyarrhythmias: Junctional rhythm, likely metabolic, resolved.  Encephalopathy: Acute, metabolic.  Resolved, currently alert and oriented.    Accelerated hypertension: On multiple medication, much better controlled with current regimen.  Observe for bradycardia on clonidine..    Anemia: Seems to be chronic, overall stable hematocrit, .  No need for transfusion.    Renal insufficiency: Acute on chronic, much improved renal function, seen by nephrology, no need for outpatient dialysis.    Respiratory insufficiency: Hypoxia, untreated O2.  Underlying pulmonary edema.  We will titrate FiO2 and check pulse oximetry on room air, add incentive spirometer.    Anxiety: Apparently chronic, on when necessary Xanax    Diabetes mellitus: Continue Accu-Chek, fairly controlled.    Debility: PT, OT as tolerated.  Doing well.    Signed by: Margarite Gouge, MD

## 2014-05-03 NOTE — Progress Note - Problem Oriented Charting Notewrit (Signed)
PROGRESS NOTE  [facility]  [address], [address]  [place], [place] [address]  [phone]  Fax [phone]    Date Time: 05/03/2014 12:25 PM  Patient Name: [name], [name]      Assessment:     Bradycardia , resolved, now SR  HTN (improving control)  Coronary artery disease  - CABG 2015  CKD  DM    Problem List:     Active Hospital Problems    Diagnosis   . Renal failure   . Bradycardia   . Acute on chronic renal failure   . Encephalopathy   . Azotemia   . CAD (coronary artery disease)   . Chronic obstructive pulmonary disease, unspecified COPD type        Plan:   Continue ASA  Continue hydralazine  continue imdur  Continue to avoid AV nodal blocking agents  Continue norvasc  Continue clonidine  No further cardiac work up at this time  Upon discharge, follow up with cardiology in 2 weeks      Subjective:   No chest pain   Medications:     Current Facility-Administered Medications   Medication Dose Route Frequency Last Rate Last Dose   . acetaminophen (TYLENOL) tablet 650 mg  650 mg Oral Q4H PRN   650 mg at 04/30/14 2300    Or   . acetaminophen (TYLENOL) suppository 650 mg  650 mg Rectal Q4H PRN       . acetaminophen (TYLENOL) tablet 650 mg  650 mg Oral Q4H PRN   650 mg at 04/29/14 0420   . albuterol-ipratropium (DUO-NEB) 2.5-0.5(3) mg/3 mL nebulizer 3 mL  3 mL Nebulization Q6H SCH       . ALPRAZolam (XANAX) tablet 0.25 mg  0.25 mg Oral Q6H PRN   0.25 mg at 05/03/14 1032   . amLODIPine (NORVASC) tablet 10 mg  10 mg Oral Daily   10 mg at 05/03/14 1019   . aspirin EC tablet 81 mg  81 mg Oral Daily   81 mg at 05/03/14 1019   . atorvastatin (LIPITOR) tablet 10 mg  10 mg Oral QHS   10 mg at 05/02/14 2229   . atropine injection 0.5 mg  0.5 mg Intravenous PRN       . bisacodyl (DULCOLAX) suppository 10 mg  10 mg Rectal QD PRN       . calcitRIOL (ROCALTROL) capsule 0.25 mcg  0.25 mcg Oral Daily   0.25 mcg at 05/03/14 1019   . cloNIDine (CATAPRES) tablet 0.2 mg  0.2 mg Oral BID   0.2 mg at 05/03/14  1019   . darbepoetin alfa (ARANESP) injection 40 mcg  40 mcg Subcutaneous Weekly   40 mcg at 05/01/14 1727   . dextrose 50 % bolus 25 mL  25 mL Intravenous PRN       . dextrose 50 % bolus 25 mL  25 mL Intravenous PRN       . dextrose 50 % bolus 25 mL  25 mL Intravenous PRN       . docusate sodium (COLACE) capsule 100 mg  100 mg Oral Daily   100 mg at 05/02/14 1048   . fentaNYL (SUBLIMAZE) injection 12.5 mcg  12.5 mcg Intravenous Q4H PRN   12.5 mcg at 04/30/14 2120   . fluticasone-salmeterol (ADVAIR DISKUS) 250-50 MCG/DOSE 1 puff  1 puff Inhalation BID   1 puff at 05/02/14 2036   . glucagon (rDNA) (GLUCAGEN) injection 1 mg  1 mg Intramuscular  PRN       . glucagon (rDNA) (GLUCAGEN) injection 1 mg  1 mg Intramuscular PRN       . glucagon (rDNA) (GLUCAGEN) injection 1 mg  1 mg Intramuscular PRN       . heparin (porcine) injection 5,000 Units  5,000 Units Subcutaneous Q12H SCH   5,000 Units at 05/03/14 1019   . hydrALAZINE (APRESOLINE) injection 10 mg  10 mg Intravenous Q6H PRN   10 mg at 05/03/14 0825   . hydrALAZINE (APRESOLINE) tablet 100 mg  100 mg Oral Q8H SCH   100 mg at 05/03/14 1019   . insulin aspart (NovoLOG) injection 1-3 Units  1-3 Units Subcutaneous QHS PRN   1 Units at 05/01/14 1727   . insulin aspart (NovoLOG) injection 1-5 Units  1-5 Units Subcutaneous PRN       . insulin aspart (NovoLOG) injection 1-5 Units  1-5 Units Subcutaneous TID AC PRN       . isosorbide mononitrate (IMDUR) 24 hr tablet 60 mg  60 mg Oral Daily   60 mg at 05/03/14 1019   . naloxone (NARCAN) injection 0.2 mg  0.2 mg Intravenous PRN       . polyethylene glycol (MIRALAX) packet 17 g  17 g Oral QD PRN       . terazosin (HYTRIN) capsule 5 mg  5 mg Oral QHS   5 mg at 05/02/14 2229   . venlafaxine [facility]) tablet 25 mg  25 mg Oral BID   25 mg at 05/03/14 1132          Physical Exam:     Filed Vitals:    05/03/14 1113   BP: 143/63   Pulse: 76   Temp: 95 F (35 C)   Resp: 18   SpO2: 98%       Intake and Output Summary (Last 24 hours) at  Date Time    Intake/Output Summary (Last 24 hours) at 05/03/14 1225  Last data filed at 05/03/14 0100   Gross per 24 hour   Intake    400 ml   Output    750 ml   Net   -350 ml       Tele - nsr, couplets    CV:rr  Lungs:cta  Abd:s  Ext:no edema    Labs:     Results     Procedure Component Value Units Date/Time    Procalcitonin [[id number]] Collected:  05/03/14 0205     Updated:  05/03/14 1147    Glucose Whole Blood - POCT [[id number]]  (Abnormal) Collected:  05/03/14 1108     POCT - Glucose Whole blood 135 (H) mg/dL Updated:  86/57/84 6962    Glucose Whole Blood - POCT [[id number]] Collected:  05/03/14 0840     POCT - Glucose Whole blood 76 mg/dL Updated:  40/10/27 2536    Hepatitis B e antibody [[id number]] Collected:  04/28/14 2058    Specimen Information:  Blood Updated:  05/03/14 0731     Hepatitis Be AB Nonreactive     MRSA culture [[id number]] Collected:  05/03/14 0205    Specimen Information:  Body Fluid / Nares and Throat Updated:  05/03/14 0459    Troponin I [[id number]] Collected:  05/03/14 0205    Specimen Information:  Blood Updated:  05/03/14 0250     Troponin I 0.07 ng/mL     B-type Natriuretic Peptide [[id number]]  (Abnormal) Collected:  05/03/14 0205    Specimen Information:  Blood Updated:  05/03/14 0250  B-Natriuretic Peptide 2702 (H) pg/mL     Hemolysis index [[id number]] Collected:  05/03/14 0205     Hemolysis Index 3 Updated:  05/03/14 0245    GFR [[id number]] Collected:  05/03/14 0205     EGFR 39.1 Updated:  05/03/14 0245    Magnesium [[id number]] Collected:  05/03/14 0205    Specimen Information:  Blood Updated:  05/03/14 0245     Magnesium 1.7 mg/dL     Phosphorus [[id number]]  (Abnormal) Collected:  05/03/14 0205    Specimen Information:  Blood Updated:  05/03/14 0245     Phosphorus 2.0 (L) mg/dL     Uric acid [[id number]] Collected:  05/03/14 0205    Specimen Information:  Blood Updated:  05/03/14 0245     Uric acid 5.8 mg/dL     Comprehensive metabolic panel [[id number]]  (Abnormal) Collected:   05/03/14 0205    Specimen Information:  Blood Updated:  05/03/14 0245     Glucose 87 mg/dL      BUN 03.5 (H) mg/dL      Creatinine 1.6 (H) mg/dL      CALCIUM 8.1 (L) mg/dL      Sodium 009 mEq/L      Potassium 3.4 (L) mEq/L      Chloride 106 mEq/L      CO2 25 mEq/L      Anion Gap 10.0      Protein, Total 5.5 (L) g/dL      Albumin 2.3 (L) g/dL      AST (SGOT) 16 U/L      ALT 14 U/L      Alkaline Phosphatase 83 U/L      Bilirubin, Total 0.4 mg/dL      Globulin 3.2 g/dL      Albumin/Globulin Ratio 0.7 (L)     CBC and differential [[id number]]  (Abnormal) Collected:  05/03/14 0205    Specimen Information:  Blood / Blood Updated:  05/03/14 0222     WBC 4.44 x10 3/uL      Hgb 8.3 (L) g/dL      Hematocrit 16.9 (L) %      Platelets 188 x10 3/uL      RBC 3.08 (L) x10 6/uL      MCV 88.6 fL      MCH 26.9 (L) pg      MCHC 30.4 (L) g/dL      RDW 19 (H) %      MPV 9.5 fL      Neutrophils 46 %      Lymphocytes Automated 30 %      Monocytes 15 %      Eosinophils Automated 9 %      Basophils Automated 0 %      Immature Granulocyte 0 %      Nucleated RBC 0 /100 WBC      Neutrophils Absolute 2.06 x10 3/uL      Abs Lymph Automated 1.32 x10 3/uL      Abs Mono Automated 0.66 x10 3/uL      Abs Eos Automated 0.39 x10 3/uL      Absolute Baso Automated 0.01 x10 3/uL      Absolute Immature Granulocyte 0.01 x10 3/uL     Glucose Whole Blood - POCT [[id number]] Collected:  05/02/14 2310     POCT - Glucose Whole blood 95 mg/dL Updated:  75/10/25 8527    Glucose Whole Blood - POCT [[id number]]  (Abnormal) Collected:  05/02/14 1600     POCT - Glucose Whole  blood 117 (H) mg/dL Updated:  30/86/57 8469    Glucose Whole Blood - POCT [[id number]]  (Abnormal) Collected:  05/02/14 1226     POCT - Glucose Whole blood 107 (H) mg/dL Updated:  24/40/10 2725            Signed by: Brunetta Genera, MD  Interventional Cardiology  [facility]  Office: [phone]  [facility] Spectral Link: [phone]

## 2014-05-03 NOTE — Plan of Care (Incomplete)
[id number]

## 2014-05-03 NOTE — Plan of Care (Signed)
Pt.'s SBP has been b/w 140's and 170's,  Scheduled Hydralazine administered as ordered, HR b/w 80's and 90's. Normal sinus rhythm, Transcutaneous patch not on at the moment, Dr. Vania Rea ordered pacing when HR drops to 50 and pt is symptomatic. Quenton cath with pigtail inserted on the right femoral artery. Pt. Had 30% of her dinner.  450cc of urine output noted. Keep monitoring the pt. [name]

## 2014-05-03 NOTE — Progress Notes (Signed)
RENAL PROGRESS NOTE    Date Time: 05/03/2014 9:49 AM  Patient Name: [name]  Attending Physician: Laveda Norman, MD    Assessment:   Acute kidney injury, renal function demonstrating signs of improvement.  Patient is nonoliguric.  Chronic kidney disease underlying  Hypokalemia and Hypophosphatemia, with improving renal function and poor nutrition  Metabolic acidosis has improved.  Hypertension with kidney disease, blood pressure control is fair.  Anemia of chronic kidney disease, hemoglobin stable, patient is on ESA.  Secondary hyperparathyroidism of kidney disease\  Diabetes, fairly controlled.    Plan:   No indication for dialysis today.  Encouraged fluid intake.  Continue all current medication including darbepoetin.  Recheck renal function tomorrow.  Replace and follow potassium and phosphorus level    Subjective:   Feels much better overall.  No new symptoms.  Last dialyzed on Saturday    Review of Systems:   No fever or chills  No cough or shortness of breath  No chest pain  No vomiting or diarrhea.  Good urine output.  No increase in leg swelling.  No joint pain or rash  No headache    Meds:     . amLODIPine  10 mg Oral Daily   . aspirin EC  81 mg Oral Daily   . atorvastatin  10 mg Oral QHS   . calcitRIOL  0.25 mcg Oral Daily   . cloNIDine  0.2 mg Oral BID   . darbepoetin alfa   40 mcg Subcutaneous Weekly   . docusate sodium  100 mg Oral Daily   . fluticasone-salmeterol  1 puff Inhalation BID   . heparin (porcine)  5,000 U Subcutaneous Q12H SCH   . hydrALAZINE  100 mg Oral Q8H SCH   . isosorbide mononitrate  60 mg Oral Daily   . terazosin  5 mg Oral QHS   . venlafaxine  25 mg Oral BID     Physical Exam:     BP 189/72 mmHg  Pulse 97  Temp(Src) 97.5 F (36.4 C) (Oral)  Resp 18  Ht 1.549 m (5\' 1" )  Wt 78.291 kg (172 lb 9.6 oz)  BMI 32.63 kg/m2  SpO2 97%    Intake    600 ml   Output    750 ml   Net   -150 ml     Alert and comfortable, fully oriented.  Pale but no jaundice or cyanosis.  No  JVD  Heart sounds are unchanged, no new murmur  Lungs reveal few right-sided crackles.  Abdomen is nontender, bowel sounds are present.  No new neurological findings.  1+ pedal edema    Labs:        05/03/14   0205  05/02/14   0157   WBC  4.44  5.44   HEMOGLOBIN  8.3*  8.8*   HEMATOCRIT  27.3*  28.8*   PLATELETS  188  213     SODIUM  141  140   POTASSIUM  3.4*  3.6   CHLORIDE  106  103   CO2  25  28   BUN  24.0*  29.0*   CREATININE  1.6*  1.9*   GLUCOSE  87  98   CALCIUM  8.1*  8.2*   MAGNESIUM  1.7  1.9   PHOSPHORUS  2.0*  2.6     AST (SGOT)  16  16   ALT  14  17   ALKALINE PHOSPHATASE  83  98   PROTEIN, TOTAL  5.5*  6.0   ALBUMIN  2.3*  2.6*         Signed by: Nancy Nordmann, MD  [phone]

## 2014-05-03 NOTE — PT Progress Note (Signed)
Physical Therapy Note    [facility]  Physical Therapy Treatment    Patient:  [name] MRN#:  [id number]  Unit:  [facility] Room/Bed:  G6269/S8546-E    Time of treatment: Time Calculation  PT Received On: 05/03/14  Start Time: 1510  Stop Time: 1535  Time Calculation (min): 25 min             Visit # 2     Precautions:   Precautions  Weight Bearing Status: RLE WBAT (Per pod note from 2/10 [facility])  Sternal Precautions: other (comment) (Patient with sx 1/28-still following sternal precautions)  Restricted BP Precautions: other (comment) (pt reports no L/UE)  Other Precautions: contact isolation, recent junctional bradycardia, 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 I would like to use the commode."  "I still follow those [sternal precautions]."  "I'm a little light headed."  "That felt good."           Pain Assessment  Pain Assessment: Numeric Scale (0-10)  Pain Score: 5-moderate pain  POSS Score: Awake and Alert  Pain Location: Foot  Pain Orientation: Right  Pain Frequency: Increases with movement;Other (Comment) (Increases c/ weight bearing)    Patient's medical condition is appropriate for Physical Therapy intervention at this time.  Patient is agreeable to participation in the therapy session. Nursing clears patient for therapy.    Objective:  PTA reviewed chart prior to treatment.    Observation of Patient/Vital Signs:    05/03/14 1515 05/03/14 1519 05/03/14 1533   Vital Signs   Heart Rate 94 90 87   BP 168/68 mmHg 150/64 mmHg 161/68 mmHg   BP  Location Right arm Right arm Right arm   Patient Position Lying Sitting Lying   Oxygen Therapy   SpO2 96 % 93 % 94 %   O2 Device None (Room air) None (Room air) None (Room air)           Patient received in bed with Telemetry and Intravenous (IV) in place.    Cognition/Neuro Status  Arousal/Alertness: Appropriate responses to stimuli  Attention Span: Appears intact  Orientation Level: Oriented X4  Memory: Appears intact  Following Commands: Follows one step commands without difficulty;Follows multistep commands with increased time  Safety Awareness: minimal verbal instruction  Insights: Fully aware of deficits  Behavior: calm;cooperative    Musculoskeletal Examination                  Functional Mobility  Supine to Sit: Stand by Assist;HOB raised;to Right  Sit to Supine: Stand by Assist;to Left  Sit to Stand: Contact Guard Assist  Stand to Sit: Contact Guard Assist  Transfers  Bed to Chair: Risk analyst  Chair to Bed: Research scientist (physical sciences)  Ambulation: Pharmacist, community Distance (Feet): 3 Feet (to/from bedside commode)  Pattern: R decreased stance time;R foot  decreased clearance;L foot decreased clearance;decreased cadence;decreased step length;Step to    Therapeutic Exercise  Knee AROM Long Arc Quad: 1x10 B/LE  Ankle Pumps: 1x10 B/LE                 Educated the patient to role of physical therapy, plan of care, goals of therapy and HEP, safety with mobility and ADLs.  Patient left in bed with bed alarm in place and call bell within reach. RN notified of session outcome.      Assessment:   Pt is slowly progressing c/ bed mobility, but continues to be limited secondary to increased R foot pain and inability to use RW secondary to adherence to sternal precautions from 03/04/2014. Pt would benefit from continued PT to improve functional mobility and activity tolerance.        Plan: Treatment/Interventions: Exercise, Gait training, Functional transfer training, LE strengthening/ROM,  Endurance training, Bed mobility, Patient/family training      PT Frequency: 3-4x/wk   Continue plan of care.    Goals: Goals  Goal Formulation: With patient  Time for Goal Acheivement: By time of discharge  Goals: Select goal  Pt Will Go Supine To Sit: independent, Partly met (SBA c/ HOB raised on 2/16)  Pt Will Perform Sit to Stand: independent, Partly met (CGA on 2/16)  Pt Will Ambulate: 101-150 feet, with rolling walker, with supervision, Not met (3' CGA to bedside commode on 2/16)  Pt Will Go Up / Down Stairs: 1-2 stairs, with supervision, With rail, Not met        DME Recommended for Discharge:  (already has FWW)  Discharge Recommendation: Home with supervision, Home with home health PT      Rexene Edison license # [id number]    Physical Medicine and Rehabilitation  [facility]  [phone]    05/03/2014  4:01 PM

## 2014-05-03 NOTE — Plan of Care (Signed)
Problem: Renal Failure  Goal: Fluid and electrolyte balance are achieved/maintained  Outcome: Progressing  The patient and care giver's learning abilities have been assessed. Today's individualized plan of care is to monitor renal function status, monitor heart rate/rhythm and blood pressure, maintain skin integrity, wound care, incentive spirometry and wean off of O2 was discussed with the patient and care giver, both have agreed to plan of care. Pt demonstrates understanding of disease process, treatment plan, medications and consequences of noncompliance. All questions and concerns were addressed.      Comments:   Pt SR on tele monitor, HR 70's. SBP high this AM at 189. PRN IV hydralazine given in addition to scheduled BP medications. Repeat SBP 140's. BUN/CT function improved today compared to yesterday. Wound dressing clean, dry and intact. Due to be changed this afternoon. Able to successfully wean pt off of O2. Pt saturating >95% on RA. Lung sounds diminished bilaterally. Will continue to monitor pt's status per plan of care.

## 2014-05-03 NOTE — Progress Notes (Signed)
Pt transferred to unit 2517A, report given to RN, MRSA culture done, Pt refused Flu vaccine. All belongingS was transported with patient and documented.

## 2014-05-03 NOTE — Progress Notes (Signed)
Received pt from [facility]u at around 0300- assessed and settled, sr on tele, vss, no distress or changes to previous assessments, made pt familiar to surroundings, call bell and bedside table within reach, bed alarm on, bed in lowest position. poc to monitor heart rate, and renal function.

## 2014-05-03 NOTE — Plan of Care (Signed)
Problem: Physical Therapy  Goal: Patient condition is improving per Physical Therapy Treatment Plan  Outcome: Progressing  Discharge Recommendation: Home with supervision, Home with home health PT  DME Recommended for Discharge:  (already has FWW)    Is an Occupational Therapy Evaluation Indicated at this time? Yes, an OT evaluation is indicated at this time.    Treatment/Interventions: Exercise, Gait training, Functional transfer training, LE strengthening/ROM, Endurance training, Bed mobility, Patient/family training  PT Frequency: 3-4x/wk     Early/Progressive Mobility Protocol Level: Step 5:OOB to Chair  (Please See Therapy Evaluation for device and assistance level needed)    Goals:   Goals  Goal Formulation: With patient  Time for Goal Acheivement: By time of discharge  Goals: Select goal  Pt Will Go Supine To Sit: independent, Partly met (SBA c/ HOB raised on 2/16)  Pt Will Perform Sit to Stand: independent, Partly met (CGA on 2/16)  Pt Will Ambulate: 101-150 feet, with rolling walker, with supervision, Not met (3' CGA to bedside commode on 2/16)  Pt Will Go Up / Down Stairs: 1-2 stairs, with supervision, With rail, Not met

## 2014-05-04 LAB — RENAL FUNCTION PANEL
Albumin: 2.5 g/dL — ABNORMAL LOW (ref 3.5–5.0)
Anion Gap: 11 (ref 5.0–15.0)
BUN: 17 mg/dL (ref 7.0–19.0)
CO2: 25 mEq/L (ref 22–29)
Calcium: 8.1 mg/dL — ABNORMAL LOW (ref 8.5–10.5)
Chloride: 105 mEq/L (ref 100–111)
Creatinine: 1.4 mg/dL — ABNORMAL HIGH (ref 0.6–1.0)
Glucose: 87 mg/dL (ref 70–100)
Phosphorus: 1.8 mg/dL — ABNORMAL LOW (ref 2.3–4.7)
Potassium: 3.6 mEq/L (ref 3.5–5.1)
Sodium: 141 mEq/L (ref 136–145)

## 2014-05-04 LAB — PROCALCITONIN: Procalcitonin: 0.2 — ABNORMAL HIGH (ref 0.0–0.1)

## 2014-05-04 LAB — GLUCOSE WHOLE BLOOD - POCT
Whole Blood Glucose POCT: 96 mg/dL (ref 70–100)
Whole Blood Glucose POCT: 119 mg/dL — ABNORMAL HIGH (ref 70–100)
Whole Blood Glucose POCT: 132 mg/dL — ABNORMAL HIGH (ref 70–100)
Whole Blood Glucose POCT: 122 mg/dL — ABNORMAL HIGH (ref 70–100)

## 2014-05-04 LAB — CBC
Hematocrit: 29 % — ABNORMAL LOW (ref 37.0–47.0)
Hgb: 8.9 g/dL — ABNORMAL LOW (ref 12.0–16.0)
MCH: 27 pg — ABNORMAL LOW (ref 28.0–32.0)
MCHC: 30.7 g/dL — ABNORMAL LOW (ref 32.0–36.0)
MCV: 87.9 fL (ref 80.0–100.0)
MPV: 9.9 fL (ref 9.4–12.3)
Nucleated RBC: 0 /100 WBC (ref 0–1)
Platelets: 209 10*3/uL (ref 140–400)
RBC: 3.3 10*6/uL — ABNORMAL LOW (ref 4.20–5.40)
RDW: 19 % — ABNORMAL HIGH (ref 12–15)
WBC: 4.32 10*3/uL (ref 3.50–10.80)

## 2014-05-04 LAB — GFR: EGFR: 45.7

## 2014-05-04 LAB — HEMOLYSIS INDEX: Hemolysis Index: 4 (ref 0–18)

## 2014-05-04 LAB — MAGNESIUM: Magnesium: 1.6 mg/dL (ref 1.6–2.6)

## 2014-05-04 MED ORDER — SODIUM CHLORIDE 0.9 % IV SOLN
20.0000 mmol | Freq: Once | INTRAVENOUS | Status: AC
Start: 2014-05-04 — End: 2014-05-04
  Administered 2014-05-04: 20 mmol via INTRAVENOUS
  Filled 2014-05-04: qty 6.67

## 2014-05-04 NOTE — Progress Notes (Signed)
RENAL PROGRESS NOTE    Date Time: 05/04/2014 9:55 AM  Patient Name: [name]  Attending Physician: Laveda Norman, MD    Assessment:   Acute kidney injury, renal function has shown continued improvement.  Underlying chronic kidney disease, stage 3-4  Hypokalemia and hypophosphatemia, potassium is better but phosphorus is lower.  Metabolic acidosis is better.  Hypertension with chronic kidney disease, blood pressure is well controlled.  Anemia of kidney disease, on darbepoetin.  Secondary hyperparathyroidism of kidney disease.  Diabetes control is better    Plan:   Encouraged oral fluids.  Continue all current medication.  Check renal function tomorrow.  If patient's hematocrit renal function does not show any worsening, she may be ready for discharge with follow-up in my office in 6-8 weeks    Subjective:   Feels better.  Wishes to go home  Intending good urine output.  Denies any new problems    Review of Systems:   Patient denies fever, chills, cough, chest pain, shortness of breath, abdominal pain, vomiting, diarrhea, headache or new neurological symptoms, new urinary complaints, joint pain, swelling or rash    Meds:     . albuterol-ipratropium  3 mL Nebulization Q6H SCH   . amLODIPine  10 mg Oral Daily   . aspirin EC  81 mg Oral Daily   . atorvastatin  10 mg Oral QHS   . calcitRIOL  0.25 mcg Oral Daily   . cloNIDine  0.2 mg Oral BID   . darbepoetin alfa   40 mcg Subcutaneous Weekly   . docusate sodium  100 mg Oral Daily   . fluticasone-salmeterol  1 puff Inhalation BID   . heparin (porcine)  5,000 U Subcutaneous Q12H SCH   . hydrALAZINE  100 mg Oral Q8H SCH   . isosorbide mononitrate  60 mg Oral Daily   . terazosin  5 mg Oral QHS   . venlafaxine  25 mg Oral BID     Physical Exam:     BP 168/73 mmHg  Pulse 92  Temp(Src) 98.1 F (36.7 C) (Oral)  Resp 16  Ht 1.549 m (5\' 1" )  Wt 69.4 kg (153 lb)  BMI 28.92 kg/m2  SpO2 96%    Intake    920 ml   Output      0 ml   Net    920 ml     Alert, oriented and  comfortable, nondyspneic  HEENT shows no new findings.  Oral mucosa moist.  No JVD  Heart sounds are normal, same systolic murmur.  Lungs are clear bilaterally  Abdomen is nontender, bowel sounds are normal.  No peripheral edema    Labs:        05/04/14   0507  05/03/14   0205   WBC  4.32  4.44   HEMOGLOBIN  8.9*  8.3*   HEMATOCRIT  29.0*  27.3*   PLATELETS  209  188     SODIUM  141  141   POTASSIUM  3.6  3.4*   CHLORIDE  105  106   CO2  25  25   BUN  17.0  24.0*   CREATININE  1.4*  1.6*   GLUCOSE  87  87   CALCIUM  8.1*  8.1*   MAGNESIUM  1.6  1.7   PHOSPHORUS  1.8*  2.0*     AST (SGOT)   --   16  16   ALT   --   14  17  ALKALINE PHOSPHATASE   --   83  98   PROTEIN, TOTAL   --   5.5*  6.0   ALBUMIN  2.5*  2.3*  2.6*       Signed by: Nancy Nordmann, MD  [phone]

## 2014-05-04 NOTE — Plan of Care (Signed)
Problem: Psychosocial and Spiritual Needs  Goal: Demonstrates ability to cope with hospitalization/illness  Outcome: Progressing  In report, night nurse stated patient has been depressed. During day shift patient seems okay. She seems alert and consolable. Will notify MD of mood and possible psych consult.    Problem: Renal Failure  Goal: Fluid and electrolyte balance are achieved/maintained  Outcome: Progressing  Patient potassium on the low side today. Creatinine has improved, now 1.4. Pt is AOX4. She received IV potassium phosphate this afternoon.

## 2014-05-04 NOTE — Plan of Care (Signed)
Problem: Hemodynamic Status: Cardiac  Goal: Stable vital signs and fluid balance  Outcome: Progressing  Pt a&o, vss, SR on tele, Hr in the 80s, denies pain, will continue to monitor cardiac status, vs, tele, labs, and report significant changes.   Intervention: Position patient for maximum circulation / cardiac output.  .  Intervention: Assess signs and symptoms associated with cardiac rhythm changes  .  Intervention: Include patient/patient care companion in decisions related to anxiety/depression.  Pt reported that she is very depressed, having difficulty of sleeping, and wants to jump off a bridge, Dr. Wilhelmina Mcardle notified, order for Ambien 5mg  obtained and give, pt currently sleeping, charge RN made aware, will request for 1:1 sitter this am.

## 2014-05-04 NOTE — Progress Notes (Signed)
PROGRESS NOTE    Date Time: 05/04/2014 1:00 PM  Patient Name: [name], [name]      Subjective:   Doing fairly well, which alert, no shortness of breath.  Taken off O2, saturation 96 percent.  Labs noted, no malignancy, stable hematocrit, and improving renal function.    Medications:      Scheduled Meds: PRN Meds:        albuterol-ipratropium 3 mL Nebulization Q6H SCH   amLODIPine 10 mg Oral Daily   aspirin EC 81 mg Oral Daily   atorvastatin 10 mg Oral QHS   calcitRIOL 0.25 mcg Oral Daily   cloNIDine 0.2 mg Oral BID   darbepoetin alfa (ARANESP) injection 40 mcg Subcutaneous Weekly   docusate sodium 100 mg Oral Daily   fluticasone-salmeterol 1 puff Inhalation BID   heparin (porcine) 5,000 Units Subcutaneous Q12H SCH   hydrALAZINE 100 mg Oral Q8H SCH   isosorbide mononitrate 60 mg Oral Daily   terazosin 5 mg Oral QHS   venlafaxine 25 mg Oral BID         Continuous Infusions:      acetaminophen 650 mg Q4H PRN   Or     acetaminophen 650 mg Q4H PRN   acetaminophen 650 mg Q4H PRN   ALPRAZolam 0.25 mg Q6H PRN   atropine 0.5 mg PRN   bisacodyl 10 mg QD PRN   dextrose 25 mL PRN   dextrose 25 mL PRN   dextrose 25 mL PRN   fentaNYL 12.5 mcg Q4H PRN   glucagon (rDNA) 1 mg PRN   glucagon (rDNA) 1 mg PRN   glucagon (rDNA) 1 mg PRN   hydrALAZINE 10 mg Q6H PRN   insulin aspart 1-3 Units QHS PRN   insulin aspart 1-5 Units PRN   insulin aspart 1-5 Units TID AC PRN   naloxone 0.2 mg PRN   polyethylene glycol 17 g QD PRN   zolpidem 5 mg QHS PRN           Review of Systems:   A comprehensive review of systems was: General ROS:[x]    negative other than :   Respiratory ROS:[]  cough,[] shortness of breath,  []  wheezing  Cardiovascular ROS:  []  chest pain []  dyspnea on exertion  Gastrointestinal ROS: []  abdominal pain, [] change in bowel habits,  [] black/ bloody stools  Genito-Urinary ROS: []  dysuria,[]  trouble voiding,[]  hematuria  Musculoskeletal ROS:[]  negative  Neurological ROS:[x]  anxiety    Physical Exam:     Filed Vitals:    05/04/14 1121    BP: 143/61   Pulse: 86   Temp: 98.1 F (36.7 C)   Resp: 16   SpO2: 92%       Intake and Output Summary (Last 24 hours) at Date Time    Intake/Output Summary (Last 24 hours) at 05/04/14 1300  Last data filed at 05/04/14 0600   Gross per 24 hour   Intake    920 ml   Output      0 ml   Net    920 ml       General appearance [x]  alert, [x]  feeling well,  [] no distress  Eyes -[]  pupils equal and[] reactive, -[]   extraocular eye movements intact  Mouth -[x]  mucous membranes moist, [] pharynx normal without lesions  Neck -[x]   supple,  [] no adenopathy, [x] no JVD,[] Thyromegaly.  Lymphatics -[]  no palpable lymphadenopathy  Chest - [x]  off O2 saturation 96 percent, []  wheezes, [] rales[x]  clear to auscultation, [x] symmetric air entry  Heart - [x] normal rate, [x] regular rhythm, [x] normal S1,  S2, []  murmurs,[]  rubs, []   gallops  Abdomen - [x] soft,[x]  nontender,[x]  nondistended,[]  no masses[]  organomegaly  Neurological - [x]   alert,[x]  oriented x3,[]  normal speech,[x]  no focal findings or movement disorder noted  Musculoskeletal - []  joint tenderness,[]  deformity [] swelling  Extremities -[]  peripheral pulses normal,[]   pedal edema,[]  clubbing []  cyanosis  Skin - [] normal coloration and turgor, []  rashes,[]   skin lesions noted                Labs:     Results     Procedure Component Value Units Date/Time    Glucose Whole Blood - POCT [[id number]]  (Abnormal) Collected:  05/04/14 1213     POCT - Glucose Whole blood 119 (H) mg/dL Updated:  40/98/11 9147    Glucose Whole Blood - POCT [[id number]] Collected:  05/04/14 0753     POCT - Glucose Whole blood 96 mg/dL Updated:  86/57/84 6962    Renal function panel [[id number]]  (Abnormal) Collected:  05/04/14 0507    Specimen Information:  Blood Updated:  05/04/14 0629     Glucose 87 mg/dL      Sodium 401 mEq/L      Potassium 3.6 mEq/L      Chloride 105 mEq/L      CO2 25 mEq/L      BUN 17.0 mg/dL      CALCIUM 8.1 (L) mg/dL      Creatinine 1.4 (H) mg/dL      Albumin 2.5 (L) g/dL       Phosphorus 1.8 (L) mg/dL      Anion Gap 02.7     Hemolysis index [[id number]] Collected:  05/04/14 0507     Hemolysis Index 4 Updated:  05/04/14 0629    GFR [[id number]] Collected:  05/04/14 0507     EGFR 45.7 Updated:  05/04/14 0629    Magnesium [[id number]] Collected:  05/04/14 0507    Specimen Information:  Blood Updated:  05/04/14 0629     Magnesium 1.6 mg/dL     CBC without differential [[id number]]  (Abnormal) Collected:  05/04/14 0507    Specimen Information:  Blood / Blood Updated:  05/04/14 0559     WBC 4.32 x10 3/uL      Hgb 8.9 (L) g/dL      Hematocrit 63.0 (L) %      Platelets 209 x10 3/uL      RBC 3.30 (L) x10 6/uL      MCV 87.9 fL      MCH 27.0 (L) pg      MCHC 30.7 (L) g/dL      RDW 19 (H) %      MPV 9.9 fL      Nucleated RBC 0 /100 WBC     MRSA culture [[id number]] Collected:  05/03/14 0205    Specimen Information:  Body Fluid / Nasal/Throat ASC Admission Updated:  05/04/14 0447    Narrative:      ORDER#: [id number]                                    ORDERED BY: SMIRNIOTOPOULOS  SOURCE: Nares and Throat                             COLLECTED:  05/03/14 02:05  ANTIBIOTICS AT COLL.:  RECEIVED :  05/03/14 04:59  Culture MRSA Surveillance                  FINAL       05/04/14 04:47  05/04/14   Negative for Methicillin Resistant Staph aureus from Nares and             Negative for Methicillin Resistant Staph aureus from Throat      Glucose Whole Blood - POCT [[id number]]  (Abnormal) Collected:  05/03/14 2130     POCT - Glucose Whole blood 151 (H) mg/dL Updated:  27/06/23 7628    Glucose Whole Blood - POCT [[id number]]  (Abnormal) Collected:  05/03/14 1715     POCT - Glucose Whole blood 123 (H) mg/dL Updated:  73/71/06 2694    Procalcitonin [[id number]] Collected:  05/03/14 0205     Updated:  05/03/14 1419          Rads:     Radiology Results (24 Hour)     Procedure Component Value Units Date/Time    US Venous Low Extrem Duplx Dopp Uni Right [[id number]] Collected:  05/03/14 1740     Order Status:  Completed Updated:  05/03/14 1745    Narrative:      EXAMINATION:  Venous Duplex Ultrasound Right Lower Extremity    HISTORY:  Right lower extremity swelling. Clinical suspicion exists for  venous thromboembolic disease.    TECHNIQUE:  Duplex evaluation of the veins of the right lower extremity  is performed from the lower pelvis to the ankle with gray scale imaging,  transverse compression and gated and color Doppler techniques.  In  addition, evaluation of the contralateral iliofemoral veins is  performed.    INTERPRETATION: The right inguinal region is unable to be evaluated due  to the overlying dressing from the central venous access catheter. This  precludes evaluation of the entire common femoral vein. The most caudal  aspect of the common femoral vein is patent. There is no evidence of  intraluminal thrombus or obstruction to venous flow.  Normal phasicity  is present at the iliofemoral junctions indicating no central  obstruction.  There is normal coaptation of the femoropopliteal veins  throughout their course with transverse compression.  The saphenofemoral  junction is also noted to be widely patent.  Deep and superficial veins  of the right calf are also demonstrated to be patent without thrombus or  obstruction.      Impression:       No evidence of deep or superficial venous thrombosis in the  right lower extremity.    Alphonzo Grieve, MD   05/03/2014 5:41 PM              Assessment/Plan:     Severe bradyarrhythmias: Junctional rhythm, likely metabolic, resolved.  Encephalopathy: Acute, metabolic.  Resolved, currently alert and oriented.    Accelerated hypertension: On multiple medication, much better controlled with current regimen.    Anemia: Seems to be chronic, stable hematocrit.    Renal insufficiency: Acute on chronic, much improved renal function, seen by nephrology, no need for outpatient dialysis.    Respiratory insufficiency: Hypoxia, resolved, off O2 with good O2  saturation.Marland Kitchen    Anxiety: Chronic, on when necessary Xanax.    Stable for discharge from pulmonary standpoint.    Signed by: Margarite Gouge, MD

## 2014-05-04 NOTE — Progress Notes (Signed)
Currently pt denies any suicidal ideation, will hold on sitter request and continue to monitor and notify significant changes.

## 2014-05-04 NOTE — Progress Notes (Signed)
PROGRESS NOTE    Date Time: 05/04/2014 7:24 PM  Patient Name: [name],[name]      Subjective:   Bun 24  crt 1.4Wbc 5.9  hct 29    Temp 98  SBP 149         Review of Systems:   A comprehensive review of systems was: General ROS: negative for - chills, fever or night sweats  Psychological ROS: negative for - anxiety, depression, disorientation, hallucinations or suicidal ideation  ENT ROS: negative for - headaches, nasal congestion, sinus pain or visual changes  Allergy and Immunology ROS: negative for - itchy/watery eyes, nasal congestion or postnasal drip  Hematological and Lymphatic ROS: negative for - bleeding problems, bruising, pallor or weight loss  Endocrine ROS: negative for - malaise/lethargy or polydipsia/polyuria  Respiratory ROS: negative for - cough, orthopnea, shortness of breath or wheezing  Cardiovascular ROS: negative for - chest pain, dyspnea on exertion, orthopnea or shortness of breath  Gastrointestinal ROS: negative for - abdominal pain, constipation, diarrhea or nausea/vomiting  Genito-Urinary ROS: negative for - change in urinary stream, dysuria, hematuria or nocturia  Musculoskeletal ROS: negative for - joint pain, joint stiffness or joint swelling  Neurological ROS: negative for - confusion, dizziness, headaches, memory loss or speech problems  Dermatological ROS: negative for pruritus, rash and skin lesion changes    Physical Exam:     Filed Vitals:    05/04/14 1846   BP: 149/57   Pulse: 82   Temp: 97.3 F (36.3 C)   Resp: 20   SpO2: 96%       General appearance -awake  Mental status -awake talking  Eyes - pupils equal and reactive, extraocular eye movements intact  Nose - normal and patent, no erythema, discharge or polyps  Mouth - mucous membranes moist, pharynx normal without lesions  Neck - supple, no significant adenopathy  Lymphatics - no palpable lymphadenopathy, no hepatosplenomegaly  Chest - clear to auscultation, no wheezes, rales or rhonchi, symmetric air entry  Heart - normal  rate, regular rhythm, normal S1, S2, no murmurs, rubs, clicks or gallops  Abdomen - soft, nontender, nondistended, no masses or organomegaly  Neurological - awake   Musculoskeletal - no joint tenderness, deformity or swelling  Extremities - peripheral pulses normal, no pedal edema, no clubbing or cyanosis  Skin - normal coloration and turgor, no rashes, no suspicious skin lesions noted    Medications:     Current Facility-Administered Medications   Medication Dose Route Frequency   . albuterol-ipratropium  3 mL Nebulization Q6H SCH   . amLODIPine  10 mg Oral Daily   . aspirin EC  81 mg Oral Daily   . atorvastatin  10 mg Oral QHS   . calcitRIOL  0.25 mcg Oral Daily   . cloNIDine  0.2 mg Oral BID   . darbepoetin alfa (ARANESP) injection  40 mcg Subcutaneous Weekly   . docusate sodium  100 mg Oral Daily   . fluticasone-salmeterol  1 puff Inhalation BID   . heparin (porcine)  5,000 Units Subcutaneous Q12H [facility]   . hydrALAZINE  100 mg Oral Q8H SCH   . isosorbide mononitrate  60 mg Oral Daily   . terazosin  5 mg Oral QHS   . venlafaxine  25 mg Oral BID       Intake and Output Summary (Last 24 hours) at Date Time    Intake/Output Summary (Last 24 hours) at 05/04/14 1924  Last data filed at 05/04/14 0600   Gross  per 24 hour   Intake    120 ml   Output      0 ml   Net    120 ml           Labs:       Recent Labs  Lab 05/04/14  0507 05/03/14  0205 05/02/14  0157   SODIUM 141 141 140   POTASSIUM 3.6 3.4* 3.6   CHLORIDE 105 106 103   CO2 25 25 28    BUN 17.0 24.0* 29.0*   CREATININE 1.4* 1.6* 1.9*   CALCIUM 8.1* 8.1* 8.2*   ALBUMIN 2.5* 2.3* 2.6*   PROTEIN, TOTAL  --  5.5* 6.0   BILIRUBIN, TOTAL  --  0.4 0.4   ALKALINE PHOSPHATASE  --  83 98   ALT  --  14 17   AST (SGOT)  --  16 16   GLUCOSE 87 87 98       Recent Labs  Lab 05/04/14  0507   WBC 4.32   HEMOGLOBIN 8.9*   HEMATOCRIT 29.0*   PLATELETS 209               Rads:     Radiology Results (24 Hour)     ** No results found for the last 24 hours. **            Assessment:      Patient Active Problem List   Diagnosis   . NSTEMI (non-ST elevated myocardial infarction)   . Hypertensive urgency   . PAD (peripheral artery disease)   . Arterial leg ulcer   . Chronic obstructive pulmonary disease, unspecified COPD type   . Ischemic cardiomyopathy   . CAD (coronary artery disease)   . Diabetes   . Chronic kidney disease   . Debilitated   . Complete heart block   . Junctional bradycardia   . Hyperkalemia   . Renal failure   . Bradycardia   . Acute on chronic renal failure   . Encephalopathy   . Azotemia   . Coronary artery disease involving native coronary artery, angina presence unspecified, unspecified whether native or transplanted heart       Plan:   Bradycardia  Junctional rhythm  Off BB     Hx CAD    Hx cardiomyopathy  Hx aortic stenosis     Acute on chronic RF   On HD    Encephalopathy  metablolic   resolved      DM SSI monitor glucose      R/O sepsis  Hypothermic resolved  Evaluated by ID  UC bld cx on invanz   cx proteus     COPD duoneb    Hypertension on hydralazine and norvasc  Signed by: Laveda Norman, MD  05/04/2014  7:24 PM

## 2014-05-04 NOTE — Progress Notes (Signed)
Nutritional Support Services  Nutrition Follow Up    [name] 64 y.o. female   MRN: [id number]    Summary of Nutrition Recommendations:  1. Continue current diet regimen, encourage small frequent meals    -----------------------------------------------------------------------------------------------------------------                                                        Assessment Data:     Nutrition: F/U to last RD note on 2/12 for stage 3 wound per RN screen. Wound care provided to venous statis R lateral malleolus present on admission. Pt only had half of coffee for lunch at visit, RN reports pt only had fruit for breakfast, declined nutrition supplements & states she is eating foods brought by her daughter & waiting for her food from home at this time. Observed missing teeth, pt denies difficulty chew/swallow. Pt reports she eat 3 meals at home & checks her BS level TID before meals.    Events of Current Admission: Transferred to [facility]  osteomyelitis to distal tibia/midfoot bones with need for BKA per podiatry at [place], temporary dialysis catheter placed on 04/28/14, UTI; nephrology following for AKI, renal function has shown improvement & last HD Saturday; noted nephrology, cardiology, pulmonology cleared for discharge     Medical Hx:  COPD, DM, CKD stage IV, MI, HTN, HLD, Anemia, CAD  PSH: CABG, colonoscopy, tubal ligation       Orders Placed This Encounter   Procedures   . Diet consistent carbohydrate and renal 80 GM Protein   Intake: <25% this breakfast & lunch per RN report & platewaste; (2/13) 25% breakfast, 20% lunch, (2/15) 75% breakfast & lunch, 30% at 2200; no other nursing % intake documentation in EMR    ANTHROPOMETRIC  Anthropometrics  Height: 154.9 cm (5\' 1" )  Weight: 69.4 kg (153 lb)  Weight Change: -5.56  IBW/kg (Calculated) Female: 50.91 kg  IBW/kg (Calculated) Female: 47.73 kg  BMI (calculated): 32.7     Wt Readings from Last 1 Encounters:   05/04/14 69.4  kg (153 lb)       Physical Assessment:   Skin: R leg ulcer (POA)- seen by [facility] on 2/12  GI function: active bowel sounds, last BM on 2/15 per doc flow; pt denies GI issues just light headedness when she woke up this morning     ESTIMATED NEEDS  Estimated Energy Needs  Total Energy Estimated Needs: 1523-1650 kcal/day   Method for Estimating Needs: Huey Romans with AF 1.2 -1.3    Estimated Protein Needs  Total Protein Estimated Needs: 94-117 gm pro/day   Method for Estimating Needs: 1.2-1.5 gm pro/kg using wt 78.3 kg     Fluid Needs  Total Fluid Estimated Needs: 1500 ml/day   Method for Estimating Needs: 1 ml/kcal or per MD     Pertinent Medications: reviewed - colace, SSI    Pertinent labs: Reviewed: BUN wnl, elevated Creatinine, low phosphorus/H/H, GFR 45.7 -   POCT glucose: 74-145 mg/dL, recent at 540    Learning Needs: No     Nutrition Assessment Summary: Pt eating <50% of hospital food today, declined nutrition supplements, pt claimed she is eating food brought by daughter.  Nutrition Diagnosis      Inadequate oral intake related to poor appetite as evidenced by meal intake <25% of lunch.                                                               Intervention     Nutrition recommendation - please see top page for details   Goal: Tolerate diet with intake of 75% or greater within 4-7 days                                                             Monitoring     Po intake, labs, GI function, POC                                                              Evaluation   Unable to determine if goal met - pt's intake from food at home unknown    Nutrition Risk Level: High- moderate     Lynann Beaver  Spectralink  [phone]  Office [phone]

## 2014-05-04 NOTE — Progress Notes (Signed)
PROGRESS NOTE  [facility]  [address], [address]  [place], [place] [address]  [phone]  Fax [phone]    Date Time: 05/04/2014 11:06 AM  Patient Name: [name], [name]      Assessment:   Bradycardia , resolved, now SR  HTN (improving control)  Coronary artery disease  - CABG 2015  CKD  DM      Problem List:     Active Hospital Problems    Diagnosis   . Coronary artery disease involving native coronary artery, angina presence unspecified, unspecified whether native or transplanted heart   . Renal failure   . Bradycardia   . Acute on chronic renal failure   . Encephalopathy   . Azotemia   . CAD (coronary artery disease)   . Chronic obstructive pulmonary disease, unspecified COPD type        Plan:     Continue ASA  Continue hydralazine  continue imdur  Continue to avoid AV nodal blocking agents  Continue norvasc  Continue clonidine  No further cardiac work up at this time  [facility] for discharge from cardiac standpoint  PT/OT  Upon discharge, follow up with cardiology in 2 weeks    Subjective:   No chest pain      Medications:     Current Facility-Administered Medications   Medication Dose Route Frequency Last Rate Last Dose   . acetaminophen (TYLENOL) tablet 650 mg  650 mg Oral Q4H PRN   650 mg at 04/30/14 2300    Or   . acetaminophen (TYLENOL) suppository 650 mg  650 mg Rectal Q4H PRN       . acetaminophen (TYLENOL) tablet 650 mg  650 mg Oral Q4H PRN   650 mg at 04/29/14 0420   . albuterol-ipratropium (DUO-NEB) 2.5-0.5(3) mg/3 mL nebulizer 3 mL  3 mL Nebulization Q6H SCH   3 mL at 05/04/14 0755   . ALPRAZolam (XANAX) tablet 0.25 mg  0.25 mg Oral Q6H PRN   0.25 mg at 05/03/14 1845   . amLODIPine (NORVASC) tablet 10 mg  10 mg Oral Daily   10 mg at 05/04/14 0917   . aspirin EC tablet 81 mg  81 mg Oral Daily   81 mg at 05/04/14 0917   . atorvastatin (LIPITOR) tablet 10 mg  10 mg Oral QHS   10 mg at 05/03/14 2207   . atropine injection 0.5 mg  0.5 mg Intravenous PRN       . bisacodyl (DULCOLAX)  suppository 10 mg  10 mg Rectal QD PRN       . calcitRIOL (ROCALTROL) capsule 0.25 mcg  0.25 mcg Oral Daily   0.25 mcg at 05/04/14 0917   . cloNIDine (CATAPRES) tablet 0.2 mg  0.2 mg Oral BID   0.2 mg at 05/04/14 0917   . darbepoetin alfa (ARANESP) injection 40 mcg  40 mcg Subcutaneous Weekly   40 mcg at 05/01/14 1727   . dextrose 50 % bolus 25 mL  25 mL Intravenous PRN       . dextrose 50 % bolus 25 mL  25 mL Intravenous PRN       . dextrose 50 % bolus 25 mL  25 mL Intravenous PRN       . docusate sodium (COLACE) capsule 100 mg  100 mg Oral Daily   100 mg at 05/02/14 1048   . fentaNYL (SUBLIMAZE) injection 12.5 mcg  12.5 mcg Intravenous Q4H PRN   12.5 mcg at 04/30/14 2120   .  fluticasone-salmeterol (ADVAIR DISKUS) 250-50 MCG/DOSE 1 puff  1 puff Inhalation BID   1 puff at 05/04/14 0755   . glucagon (rDNA) (GLUCAGEN) injection 1 mg  1 mg Intramuscular PRN       . glucagon (rDNA) (GLUCAGEN) injection 1 mg  1 mg Intramuscular PRN       . glucagon (rDNA) (GLUCAGEN) injection 1 mg  1 mg Intramuscular PRN       . heparin (porcine) injection 5,000 Units  5,000 Units Subcutaneous Q12H SCH   5,000 Units at 05/04/14 0917   . hydrALAZINE (APRESOLINE) injection 10 mg  10 mg Intravenous Q6H PRN   10 mg at 05/03/14 0825   . hydrALAZINE (APRESOLINE) tablet 100 mg  100 mg Oral Q8H SCH   100 mg at 05/04/14 0917   . insulin aspart (NovoLOG) injection 1-3 Units  1-3 Units Subcutaneous QHS PRN   1 Units at 05/03/14 2207   . insulin aspart (NovoLOG) injection 1-5 Units  1-5 Units Subcutaneous PRN       . insulin aspart (NovoLOG) injection 1-5 Units  1-5 Units Subcutaneous TID AC PRN       . isosorbide mononitrate (IMDUR) 24 hr tablet 60 mg  60 mg Oral Daily   60 mg at 05/04/14 0917   . naloxone [facility]) injection 0.2 mg  0.2 mg Intravenous PRN       . polyethylene glycol (MIRALAX) packet 17 g  17 g Oral QD PRN       . potassium phosphate 20 mmol in sodium chloride 0.9 % 250 mL IVPB  20 mmol Intravenous Once       . terazosin (HYTRIN)  capsule 5 mg  5 mg Oral QHS   5 mg at 05/03/14 2207   . venlafaxine [facility]) tablet 25 mg  25 mg Oral BID   25 mg at 05/04/14 0917   . zolpidem (AMBIEN) tablet 5 mg  5 mg Oral QHS PRN   5 mg at 05/03/14 2320            Physical Exam:     Filed Vitals:    05/04/14 0757   BP: 168/73   Pulse: 92   Temp: 98.1 F (36.7 C)   Resp: 16   SpO2: 96%       Intake and Output Summary (Last 24 hours) at Date Time    Intake/Output Summary (Last 24 hours) at 05/04/14 1106  Last data filed at 05/04/14 0600   Gross per 24 hour   Intake    920 ml   Output      0 ml   Net    920 ml       Tele - nsr, couplets    CV:rr  Lungs:cta  Abd:s  Ext:no edema    Labs:     Results     Procedure Component Value Units Date/Time    Glucose Whole Blood - POCT [[id number]] Collected:  05/04/14 0753     POCT - Glucose Whole blood 96 mg/dL Updated:  40/98/11 9147    Renal function panel [[id number]]  (Abnormal) Collected:  05/04/14 0507    Specimen Information:  Blood Updated:  05/04/14 0629     Glucose 87 mg/dL      Sodium 865 mEq/L      Potassium 3.6 mEq/L      Chloride 105 mEq/L      CO2 25 mEq/L      BUN 17.0 mg/dL      CALCIUM 8.1 (L)  mg/dL      Creatinine 1.4 (H) mg/dL      Albumin 2.5 (L) g/dL      Phosphorus 1.8 (L) mg/dL      Anion Gap 16.1     Hemolysis index [[id number]] Collected:  05/04/14 0507     Hemolysis Index 4 Updated:  05/04/14 0629    GFR [[id number]] Collected:  05/04/14 0507     EGFR 45.7 Updated:  05/04/14 0629    Magnesium [[id number]] Collected:  05/04/14 0507    Specimen Information:  Blood Updated:  05/04/14 0629     Magnesium 1.6 mg/dL     CBC without differential [[id number]]  (Abnormal) Collected:  05/04/14 0507    Specimen Information:  Blood / Blood Updated:  05/04/14 0559     WBC 4.32 x10 3/uL      Hgb 8.9 (L) g/dL      Hematocrit 52.8 (L) %      Platelets 209 x10 3/uL      RBC 3.30 (L) x10 6/uL      MCV 87.9 fL      MCH 27.0 (L) pg      MCHC 30.7 (L) g/dL      RDW 19 (H) %      MPV 9.9 fL      Nucleated RBC 0 /100 WBC      MRSA culture [[id number]] Collected:  05/03/14 0205    Specimen Information:  Body Fluid / Nasal/Throat ASC Admission Updated:  05/04/14 0447    Narrative:      ORDER#: [id number]                                    ORDERED BY: SMIRNIOTOPOULOS  SOURCE: Nares and Throat                             COLLECTED:  05/03/14 02:05  ANTIBIOTICS AT COLL.:                                RECEIVED :  05/03/14 04:59  Culture MRSA Surveillance                  FINAL       05/04/14 04:47  05/04/14   Negative for Methicillin Resistant Staph aureus from Nares and             Negative for Methicillin Resistant Staph aureus from Throat      Glucose Whole Blood - POCT [[id number]]  (Abnormal) Collected:  05/03/14 2130     POCT - Glucose Whole blood 151 (H) mg/dL Updated:  63/87/56 4332    Glucose Whole Blood - POCT [[id number]]  (Abnormal) Collected:  05/03/14 1715     POCT - Glucose Whole blood 123 (H) mg/dL Updated:  09/15/14 0109    Procalcitonin [[id number]] Collected:  05/03/14 0205     Updated:  05/03/14 1419    Glucose Whole Blood - POCT [[id number]]  (Abnormal) Collected:  05/03/14 1108     POCT - Glucose Whole blood 135 (H) mg/dL Updated:  37/62/83 1517              Signed by: Brunetta Genera, MD  Interventional Cardiology  [facility]  Office: [phone]  [place] Spectral Link: [phone]

## 2014-05-04 NOTE — Progress Notes (Signed)
PROGRESS NOTE    Date Time: 05/04/2014 7:23 PM  Patient Name: [name], [name]      Subjective:   Bun 24  crt 1.6Wbc 5.9  hct 24    Temp 98  SBP 149         Review of Systems:   A comprehensive review of systems was: General ROS: negative for - chills, fever or night sweats  Psychological ROS: negative for - anxiety, depression, disorientation, hallucinations or suicidal ideation  ENT ROS: negative for - headaches, nasal congestion, sinus pain or visual changes  Allergy and Immunology ROS: negative for - itchy/watery eyes, nasal congestion or postnasal drip  Hematological and Lymphatic ROS: negative for - bleeding problems, bruising, pallor or weight loss  Endocrine ROS: negative for - malaise/lethargy or polydipsia/polyuria  Respiratory ROS: negative for - cough, orthopnea, shortness of breath or wheezing  Cardiovascular ROS: negative for - chest pain, dyspnea on exertion, orthopnea or shortness of breath  Gastrointestinal ROS: negative for - abdominal pain, constipation, diarrhea or nausea/vomiting  Genito-Urinary ROS: negative for - change in urinary stream, dysuria, hematuria or nocturia  Musculoskeletal ROS: negative for - joint pain, joint stiffness or joint swelling  Neurological ROS: negative for - confusion, dizziness, headaches, memory loss or speech problems  Dermatological ROS: negative for pruritus, rash and skin lesion changes    Physical Exam:     Filed Vitals:    05/04/14 1846   BP: 149/57   Pulse: 82   Temp: 97.3 F (36.3 C)   Resp: 20   SpO2: 96%       General appearance -awake  Mental status -awake talking  Eyes - pupils equal and reactive, extraocular eye movements intact  Nose - normal and patent, no erythema, discharge or polyps  Mouth - mucous membranes moist, pharynx normal without lesions  Neck - supple, no significant adenopathy  Lymphatics - no palpable lymphadenopathy, no hepatosplenomegaly  Chest - clear to auscultation, no wheezes, rales or rhonchi, symmetric air entry  Heart - normal  rate, regular rhythm, normal S1, S2, no murmurs, rubs, clicks or gallops  Abdomen - soft, nontender, nondistended, no masses or organomegaly  Neurological - awake   Musculoskeletal - no joint tenderness, deformity or swelling  Extremities - peripheral pulses normal, no pedal edema, no clubbing or cyanosis  Skin - normal coloration and turgor, no rashes, no suspicious skin lesions noted    Medications:     Current Facility-Administered Medications   Medication Dose Route Frequency   . albuterol-ipratropium  3 mL Nebulization Q6H SCH   . amLODIPine  10 mg Oral Daily   . aspirin EC  81 mg Oral Daily   . atorvastatin  10 mg Oral QHS   . calcitRIOL  0.25 mcg Oral Daily   . cloNIDine  0.2 mg Oral BID   . darbepoetin alfa (ARANESP) injection  40 mcg Subcutaneous Weekly   . docusate sodium  100 mg Oral Daily   . fluticasone-salmeterol  1 puff Inhalation BID   . heparin (porcine)  5,000 Units Subcutaneous Q12H [facility]   . hydrALAZINE  100 mg Oral Q8H SCH   . isosorbide mononitrate  60 mg Oral Daily   . terazosin  5 mg Oral QHS   . venlafaxine  25 mg Oral BID       Intake and Output Summary (Last 24 hours) at Date Time    Intake/Output Summary (Last 24 hours) at 05/04/14 1923  Last data filed at 05/04/14 0600   Gross  per 24 hour   Intake    120 ml   Output      0 ml   Net    120 ml           Labs:       Recent Labs  Lab 05/04/14  0507 05/03/14  0205 05/02/14  0157   SODIUM 141 141 140   POTASSIUM 3.6 3.4* 3.6   CHLORIDE 105 106 103   CO2 25 25 28    BUN 17.0 24.0* 29.0*   CREATININE 1.4* 1.6* 1.9*   CALCIUM 8.1* 8.1* 8.2*   ALBUMIN 2.5* 2.3* 2.6*   PROTEIN, TOTAL  --  5.5* 6.0   BILIRUBIN, TOTAL  --  0.4 0.4   ALKALINE PHOSPHATASE  --  83 98   ALT  --  14 17   AST (SGOT)  --  16 16   GLUCOSE 87 87 98       Recent Labs  Lab 05/04/14  0507   WBC 4.32   HEMOGLOBIN 8.9*   HEMATOCRIT 29.0*   PLATELETS 209               Rads:     Radiology Results (24 Hour)     ** No results found for the last 24 hours. **            Assessment:      Patient Active Problem List   Diagnosis   . NSTEMI (non-ST elevated myocardial infarction)   . Hypertensive urgency   . PAD (peripheral artery disease)   . Arterial leg ulcer   . Chronic obstructive pulmonary disease, unspecified COPD type   . Ischemic cardiomyopathy   . CAD (coronary artery disease)   . Diabetes   . Chronic kidney disease   . Debilitated   . Complete heart block   . Junctional bradycardia   . Hyperkalemia   . Renal failure   . Bradycardia   . Acute on chronic renal failure   . Encephalopathy   . Azotemia   . Coronary artery disease involving native coronary artery, angina presence unspecified, unspecified whether native or transplanted heart       Plan:   Bradycardia  Junctional rhythm  Off BB     Hx CAD    Hx cardiomyopathy  Hx aortic stenosis     Acute on chronic RF   On HD    Encephalopathy  metablolic   resolved      DM SSI monitor glucose      R/O sepsis  Hypothermic resolved  Evaluated by ID  UC bld cx on invanz   cx proteus     COPD duoneb    Hypertension on hydralazine and norvasc  Signed by: Laveda Norman, MD  05/04/2014  7:23 PM

## 2014-05-05 ENCOUNTER — Inpatient Hospital Stay: Payer: Medicare Other

## 2014-05-05 ENCOUNTER — Other Ambulatory Visit: Payer: BLUE CROSS/BLUE SHIELD

## 2014-05-05 DIAGNOSIS — I639 Cerebral infarction, unspecified: Secondary | ICD-10-CM

## 2014-05-05 LAB — GLUCOSE WHOLE BLOOD - POCT
Whole Blood Glucose POCT: 127 mg/dL — ABNORMAL HIGH (ref 70–100)
Whole Blood Glucose POCT: 160 mg/dL — ABNORMAL HIGH (ref 70–100)
Whole Blood Glucose POCT: 140 mg/dL — ABNORMAL HIGH (ref 70–100)
Whole Blood Glucose POCT: 157 mg/dL — ABNORMAL HIGH (ref 70–100)
Whole Blood Glucose POCT: 148 mg/dL — ABNORMAL HIGH (ref 70–100)

## 2014-05-05 LAB — HEMOLYSIS INDEX
Hemolysis Index: 3 (ref 0–18)
Hemolysis Index: 48 — ABNORMAL HIGH (ref 0–18)

## 2014-05-05 LAB — BLOOD GAS, ARTERIAL
Arterial Total CO2: 24.4 mEq/L (ref 24.0–30.0)
Base Excess, Arterial: 3.5 mEq/L — ABNORMAL HIGH (ref <=2.0)
HCO3, Arterial: 26.7 mEq/L (ref 23.0–29.0)
O2 Sat, Arterial: 95.4 % (ref 95.0–100.0)
Temperature: 37
pCO2, Arterial: 36.4 mmhg (ref 35.0–45.0)
pH, Arterial: 7.478 — ABNORMAL HIGH (ref 7.350–7.450)
pO2, Arterial: 73.5 mmhg — ABNORMAL LOW (ref 80.0–90.0)

## 2014-05-05 LAB — COMPREHENSIVE METABOLIC PANEL
ALT: 15 U/L (ref 0–55)
AST (SGOT): 25 U/L (ref 5–34)
Albumin/Globulin Ratio: 0.7 — ABNORMAL LOW (ref 0.9–2.2)
Albumin: 2.7 g/dL — ABNORMAL LOW (ref 3.5–5.0)
Alkaline Phosphatase: 97 U/L (ref 37–106)
Anion Gap: 11 (ref 5.0–15.0)
BUN: 14 mg/dL (ref 7.0–19.0)
Bilirubin, Total: 0.4 mg/dL (ref 0.2–1.2)
CO2: 24 mEq/L (ref 22–29)
Calcium: 8.4 mg/dL — ABNORMAL LOW (ref 8.5–10.5)
Chloride: 106 mEq/L (ref 100–111)
Creatinine: 1.4 mg/dL — ABNORMAL HIGH (ref 0.6–1.0)
Globulin: 4 g/dL — ABNORMAL HIGH (ref 2.0–3.6)
Glucose: 134 mg/dL — ABNORMAL HIGH (ref 70–100)
Potassium: 3.9 mEq/L (ref 3.5–5.1)
Protein, Total: 6.7 g/dL (ref 6.0–8.3)
Sodium: 141 mEq/L (ref 136–145)

## 2014-05-05 LAB — ELECTROLYTES WHOLE BLOOD
Chloride, WB: 105 mEq/L (ref 98–106)
Whole Blood Glucose: 126 mg/dL — ABNORMAL HIGH (ref 70–100)
Whole Blood Potassium: 3.6 mEq/L (ref 3.5–5.3)
Whole Blood Sodium: 140 mEq/L (ref 136–146)

## 2014-05-05 LAB — COOXIMETRY PROFILE
Carboxyhemoglobin: 1.8 % (ref 0.0–3.0)
Hematocrit Total Calculated: 31.7 % — ABNORMAL LOW (ref 37.0–47.0)
Hemoglobin Total: 10.3 g/dL — ABNORMAL LOW (ref 12.0–16.0)
Methemoglobin: 1.6 % (ref 0.0–3.0)
O2 Content: 13.4
Oxygenated Hemoglobin: 92.2 % (ref 85.0–98.0)

## 2014-05-05 LAB — PT AND APTT
PT INR: 1 (ref 0.9–1.1)
PT: 13.6 s (ref 12.6–15.0)
PTT: 38 s — ABNORMAL HIGH (ref 23–37)

## 2014-05-05 LAB — CREATININE WHOLE BLOOD: Whole Blood Creatinine: 1.48 mg/dL (ref 0.40–1.10)

## 2014-05-05 LAB — LIPID PANEL
Cholesterol / HDL Ratio: 3.5
Cholesterol: 185 mg/dL (ref 0–199)
HDL: 53 mg/dL (ref >40)
LDL Calculated: 107 mg/dL — ABNORMAL HIGH (ref 0–99)
Triglycerides: 124 mg/dL (ref 34–149)
VLDL Calculated: 25 mg/dL (ref 10–40)

## 2014-05-05 LAB — LACTIC ACID, PLASMA: Lactic Acid: 1.2 mmol/L (ref 0.2–2.0)

## 2014-05-05 LAB — CALCIUM, IONIZED: Calcium, Ionized: 2.17 mEq/L — ABNORMAL LOW (ref 2.30–2.58)

## 2014-05-05 LAB — GFR: EGFR: 45.7

## 2014-05-05 MED ORDER — FUROSEMIDE 10 MG/ML IJ SOLN
20.0000 mg | Freq: Once | INTRAMUSCULAR | Status: AC
Start: 2014-05-05 — End: 2014-05-05

## 2014-05-05 MED ORDER — ATORVASTATIN CALCIUM 40 MG PO TABS
40.0000 mg | ORAL_TABLET | Freq: Every evening | ORAL | Status: DC
Start: 2014-05-05 — End: 2014-05-16
  Administered 2014-05-05 – 2014-05-15 (×11): 40 mg via ORAL
  Filled 2014-05-05 (×11): qty 1

## 2014-05-05 MED ORDER — FUROSEMIDE 10 MG/ML IJ SOLN
INTRAMUSCULAR | Status: AC
Start: 2014-05-05 — End: 2014-05-05
  Administered 2014-05-05: 20 mg via INTRAVENOUS
  Filled 2014-05-05: qty 2

## 2014-05-05 NOTE — Progress Notes (Signed)
PROGRESS NOTE    Date Time: 05/05/2014 12:22 PM  Patient Name: [name], [name]      Subjective:   Doing fair, status post MRI of the brain which showed acute CVA.  Awake, alert, has been on room air with saturation about 93 percent.    Medications:      Scheduled Meds: PRN Meds:        albuterol-ipratropium 3 mL Nebulization Q6H SCH   amLODIPine 10 mg Oral Daily   aspirin EC 81 mg Oral Daily   atorvastatin 10 mg Oral QHS   calcitRIOL 0.25 mcg Oral Daily   cloNIDine 0.2 mg Oral BID   darbepoetin alfa (ARANESP) injection 40 mcg Subcutaneous Weekly   docusate sodium 100 mg Oral Daily   fluticasone-salmeterol 1 puff Inhalation BID   heparin (porcine) 5,000 Units Subcutaneous Q12H SCH   hydrALAZINE 100 mg Oral Q8H SCH   isosorbide mononitrate 60 mg Oral Daily   terazosin 5 mg Oral QHS   venlafaxine 25 mg Oral BID         Continuous Infusions:      acetaminophen 650 mg Q4H PRN   Or     acetaminophen 650 mg Q4H PRN   acetaminophen 650 mg Q4H PRN   ALPRAZolam 0.25 mg Q6H PRN   atropine 0.5 mg PRN   bisacodyl 10 mg QD PRN   dextrose 25 mL PRN   dextrose 25 mL PRN   dextrose 25 mL PRN   fentaNYL 12.5 mcg Q4H PRN   glucagon (rDNA) 1 mg PRN   glucagon (rDNA) 1 mg PRN   glucagon (rDNA) 1 mg PRN   hydrALAZINE 10 mg Q6H PRN   insulin aspart 1-3 Units QHS PRN   insulin aspart 1-5 Units PRN   insulin aspart 1-5 Units TID AC PRN   naloxone 0.2 mg PRN   polyethylene glycol 17 g QD PRN   zolpidem 5 mg QHS PRN           Review of Systems:   A comprehensive review of systems was: General ROS:[x]    negative other than :   Respiratory ROS:[]  cough,[] shortness of breath,  []  wheezing  Cardiovascular ROS:  []  chest pain []  dyspnea on exertion  Gastrointestinal ROS: []  abdominal pain, [] change in bowel habits,  [] black/ bloody stools  Genito-Urinary ROS: []  dysuria,[]  trouble voiding,[]  hematuria  Musculoskeletal ROS:[]  negative  Neurological ROS:[]      Physical Exam:     Filed Vitals:    05/05/14 0801   BP: 159/74   Pulse: 115   Temp: 97.7 F  (36.5 C)   Resp: 18   SpO2: 93%       Intake and Output Summary (Last 24 hours) at Date Time  No intake or output data in the 24 hours ending 05/05/14 1222    General appearance [x]  alert, [x]  looking well,  [] no distress  Eyes -[]  pupils equal and[] reactive, -[]   extraocular eye movements intact  Mouth -[x]  mucous membranes moist, [] pharynx normal without lesions  Neck -[x]   supple,  [] no adenopathy, [x] no JVD,[] Thyromegaly.  Lymphatics -[]  no palpable lymphadenopathy  Chest - [x]  off O2 saturation 93 percent, []  wheezes, [] rales[x]  clear to auscultation, [x] symmetric air entry  Heart - [x] normal rate, [x] regular rhythm, [x] normal S1, S2, []  murmurs,[]  rubs, []   gallops  Abdomen - [x] soft,[x]  nontender,[x]  nondistended,[]  no masses[]  organomegaly  Neurological - [x]   alert,[x]  oriented x3,[]  normal speech,[]  no focal findings or movement disorder noted  Musculoskeletal - []  joint tenderness,[]  deformity [] swelling  Extremities -[]  peripheral pulses normal,[]   pedal edema,[]  clubbing []  cyanosis  Skin - [] normal coloration and turgor, []  rashes,[]   skin lesions noted                Labs:     Results     Procedure Component Value Units Date/Time    Glucose Whole Blood - POCT [[id number]]  (Abnormal) Collected:  05/05/14 1120     POCT - Glucose Whole blood 140 (H) mg/dL Updated:  40/98/11 9147    Glucose Whole Blood - POCT [[id number]]  (Abnormal) Collected:  05/05/14 0759     POCT - Glucose Whole blood 160 (H) mg/dL Updated:  86/57/84 6962    PT/APTT [[id number]]  (Abnormal) Collected:  05/05/14 0333     PT 13.6 sec Updated:  05/05/14 0552     PT INR 1.0      PT Anticoag. Given Within 48 hrs. heparin      PTT 38 (H) sec     Comprehensive metabolic panel [[id number]]  (Abnormal) Collected:  05/05/14 0318    Specimen Information:  Blood Updated:  05/05/14 0347     Glucose 134 (H) mg/dL      BUN 66.4 mg/dL      Creatinine 1.4 (H) mg/dL      Sodium 403 mEq/L      Potassium 3.9 mEq/L      Chloride 106 mEq/L      CO2 24 mEq/L       CALCIUM 8.4 (L) mg/dL      Protein, Total 6.7 g/dL      Albumin 2.7 (L) g/dL      AST (SGOT) 25 U/L      ALT 15 U/L      Alkaline Phosphatase 97 U/L      Bilirubin, Total 0.4 mg/dL      Globulin 4.0 (H) g/dL      Albumin/Globulin Ratio 0.7 (L)      Anion Gap 11.0     Hemolysis index [[id number]] Collected:  05/05/14 0318     Hemolysis Index 3 Updated:  05/05/14 0347    GFR [[id number]] Collected:  05/05/14 0318     EGFR 45.7 Updated:  05/05/14 0347    Blood gas, arterial [[id number]]  (Abnormal) Collected:  05/05/14 0317    Specimen Information:  Blood, Arterial Updated:  05/05/14 0337     pH, Arterial 7.478 (H)      pCO2, Arterial 36.4 mmhg      pO2, Arterial 73.5 (L) mmhg      HCO3, Arterial 26.7 mEq/L      Arterial Total CO2 24.4 mEq/L      Base Excess, Arterial 3.5 (H) mEq/L      O2 Sat, Arterial 95.4 %      ABG CollectionSite Left Radl      Allen's Test Yes      Temperature 37.0      Status Oxygen      O2 Delivery Nasal Cannula     ELECTROLYTES WHOLE BLOOD [[id number]]  (Abnormal) Collected:  05/05/14 0317     Sodium Whole Blood 140 mEq/L Updated:  05/05/14 0337     Potassium Whole Blood 3.6 mEq/L      Chloride, WB 105 mEq/L      Glucose Whole Blood 126 (H) mg/dL     Calcium, ionized [[id number]]  (Abnormal) Collected:  05/05/14 0317     Calcium, Ionized 2.17 (L) mEq/L Updated:  05/05/14 0337    CREATININE WHOLE BLOOD [[id number]]  (  Abnormal) Collected:  05/05/14 0317     Creatinine Whole Blood 1.48 (HH) mg/dL Updated:  09/81/19 1478    Lactic acid, plasma [[id number]] Collected:  05/05/14 0317     Lactic acid 1.2 mmol/L Updated:  05/05/14 0337    Co-Oximetry Profile [[id number]]  (Abnormal) Collected:  05/05/14 0317     Total Hemoglobin 10.3 (L) g/dL Updated:  95/28/41 3244     Carboxyhemoglobin 1.8 %      Oxygenated Hemoglobin 92.2 %      Methemoglobin 1.6 %      O2 Content 13.4      Total Hematocrit Calculated 31.7 (L) %     Glucose Whole Blood - POCT [[id number]]  (Abnormal) Collected:  05/05/14 0307     POCT -  Glucose Whole blood 127 (H) mg/dL Updated:  64/40/34 7425    Glucose Whole Blood - POCT [[id number]]  (Abnormal) Collected:  05/04/14 2116     POCT - Glucose Whole blood 122 (H) mg/dL Updated:  33/29/51 8841    Glucose Whole Blood - POCT [[id number]]  (Abnormal) Collected:  05/04/14 1614     POCT - Glucose Whole blood 132 (H) mg/dL Updated:  10/93/23 5573    Procalcitonin [[id number]]  (Abnormal) Collected:  05/03/14 0205     Procalcitonin 0.2 (H) Updated:  05/04/14 1419          Rads:     Radiology Results (24 Hour)     Procedure Component Value Units Date/Time    MRI Brain WO Contrast [[id number]] Collected:  05/05/14 0744    Order Status:  Completed Updated:  05/05/14 0846    Narrative:      MRI BRAIN     CLINICAL INDICATION: Acute Change in Mental Status    COMPARISONS: Head CT, same date    TECHNIQUE: Standard multi-sequence and multi-planar brain MRI without  contrast.     FINDINGS:      Slightly limited examination due to motion artifact.    True restricted diffusion involving the left insula and anterior  temporal lobe (sequence 3 image 32-40) is concerning for acute ischemia.    There is no evidence of acute intra-or extra axial fluid collection,  hemorrhage, mass, or mass effect.      A few scattered periventricular increased T2/FLAIR foci are likely  sequela chronic microvascular ischemic changes.    Axial hemo sequence demonstrates 2 small blooming artifact foci in the  left parietal lobe (sequence 8 image 10) suggesting probable chronic  microhemorrhage.    The ventricles, basal cisterns and sulci are normal for patient's age.  The major flow-voids are unremarkable.        The visualized orbits, mastoid air cells and paranasal sinuses are  unremarkable.      Impression:           Acute ischemia involving the left insular and left anterior temporal  lobe (left MCA distribution). No associated evidence of acute  hemorrhage.      This critical result was reported to the inpatient nurse caring for the  patient,  Nurse Shanda Bumps, by telephone on 05/05/2014 at 8:07 AM, who  acknowledged and repeated the result.    Max Fickle, MD   05/05/2014 8:42 AM      XR Chest AP Portable [[id number]] Collected:  05/05/14 0709    Order Status:  Completed Updated:  05/05/14 0716    Narrative:      INDICATION: resp distress    TECHNIQUE: Portable chest  COMPARISON: Multiple examinations with the latest dated 05/01/2014      Impression:      FINDINGS/    No significant change from most recent examination.    Stable likely congestive heart failure with cardiomegaly, pulmonary  edema, right pleural effusion, and right greater than left bibasilar  density. No pneumothorax. Superimposed infection cannot be excluded.                   Max Fickle, MD   05/05/2014 7:12 AM      CT Head WO Contrast [[id number]] Collected:  05/05/14 0353    Order Status:  Completed Updated:  05/05/14 0358    Narrative:      History: Altered mental status.    Technique: Axial CT brain obtained from vertex to skull base without  contrast.     Comparisons: None.    Findings:  Gray-white matter differentiation preserved.    Basal ganglia, thalami, midbrain, pons unremarkable.    No mass, mass effect, or midline shift. No intra-axial or extra-axial  hemorrhage or collection.    Ventricles, sulci, cisterns age-appropriate in size and configuration.    Calvarium appears intact.     Soft tissue unremarkable.      Impression:        No acute intracranial abnormality.    Adaline Sill, MD   05/05/2014 3:54 AM              Assessment/Plan:     Respiratory insufficiency: Hypoxia, resolved, off O2 with good O2 saturation    Severe bradyarrhythmias: Junctional rhythm, likely metabolic, resolved.    Encephalopathy: Acute, ?metabolic.  MRI with acute left CVA, seen by neurology.    Accelerated hypertension: Fairly well controlled, On multiple medication.    Anemia: Seems to be chronic, stable hematocrit.    Renal insufficiency: Acute on chronic, much improved renal  function.    Stable for discharge from pulmonary standpoint.  Has been off O2, no need for home O2 on discharge.    Signed by: Margarite Gouge, MD

## 2014-05-05 NOTE — Progress Notes (Signed)
Radiology department called several times due to inability to reach pt attending to report pt being positive for acute stroke. I called Dr Berneice Heinrich Silis emergent paging service and placed a stat page to Dr Shirleen Schirmer. Received a call from Dr Wilhelmina Mcardle reporting he spoke with Radiology, pt to be NPO for SLP eval, Neurology is being contracted by Dr Wilhelmina Mcardle.

## 2014-05-05 NOTE — Progress Notes (Signed)
RENAL PROGRESS NOTE    Date Time: 05/05/2014 1:51 PM  Patient Name: [name]  Attending Physician: Laveda Norman, MD    Assessment:   Acute kidney injury, renal function has recovered.  Stage III chronic kidney disease, renal function stable at what appears to be baseline.  Hypokalemia and hypophosphatemia, replaced, potassium level is better.  Metabolic acidosis has improved.  Hypertension with kidney disease, blood pressure is controlled.  Anemia of chronic kidney disease, stable hemoglobin on darbepoetin.  Diabetes control is excellent.  Acute stroke  Secondary hyperparathyroidism of kidney disease.    Plan:   If patient is unable to take orally, will benefit from IV fluids.  Follow urine output and renal function.  No changes to current medication.  No other new recommendation today.  Renal panel tomorrow    Subjective:   Chart reviewed and events notedent is had an acute stroke based on MRI findings.  Patient is not communicating much, but is comfortably lying in bed, nondyspneic  Urine output has been fair.    Review of Systems:   Patient is unable to cooperate for review of systems.    Meds:     . albuterol-ipratropium  3 mL Nebulization Q6H SCH   . amLODIPine  10 mg Oral Daily   . aspirin EC  81 mg Oral Daily   . atorvastatin  40 mg Oral QHS   . calcitRIOL  0.25 mcg Oral Daily   . cloNIDine  0.2 mg Oral BID   . darbepoetin alfa   40 mcg Subcutaneous Weekly   . docusate sodium  100 mg Oral Daily   . fluticasone-salmeterol  1 puff Inhalation BID   . heparin (porcine)  5,000 U Subcutaneous Q12H SCH   . hydrALAZINE  100 mg Oral Q8H SCH   . isosorbide mononitrate  60 mg Oral Daily   . terazosin  5 mg Oral QHS   . venlafaxine  25 mg Oral BID     Physical Exam:     BP 180/75 mmHg  Pulse 104  Temp(Src) 98.1 F (36.7 C) (Oral)  Resp 18  Ht 1.549 m (5\' 1" )  Wt 69.4 kg (153 lb)  BMI 28.92 kg/m2  SpO2 97%    Awake but not communicating, very slow to respond.  Skin turgor is fair.  No JVD  Lungs reveal no  crackles  Heart sounds are unchanged, no new murmur  Abdomen is soft with normal bowel sounds  Moving all 4 limbs  No edema    Labs:        05/04/14   0507  05/03/14   0205   WBC  4.32  4.44   HEMOGLOBIN  8.9*  8.3*   HEMATOCRIT  29.0*  27.3*   PLATELETS  209  188     SODIUM  141  141  141   POTASSIUM  3.9  3.6  3.4*   CHLORIDE  106  105  106   CO2  24  25  25    BUN  14.0  17.0  24.0*   CREATININE  1.4*  1.4*  1.6*   GLUCOSE  134*  87  87   CALCIUM  8.4*  8.1*  8.1*   MAGNESIUM   --   1.6  1.7   PHOSPHORUS   --   1.8*  2.0*     AST (SGOT)  25   --   16   ALT  15   --   14  ALKALINE PHOSPHATASE  97   --   83   PROTEIN, TOTAL  6.7   --   5.5*   ALBUMIN  2.7*  2.5*  2.3*     Radiology Results (24 Hour)     Procedure Component Value Units Date/Time    MRI Brain WO Contrast [[id number]] Collected:  05/05/14 0744     Acute ischemia involving the left insular and left anterior temporal  lobe (left MCA distribution). No associated evidence of acute  hemorrhage.    XR Chest AP Portable [[id number]]  -Stable CHF, pleural effusion Collected:  05/05/14 0709    CT Head WO Contrast [[id number]] Collected:  05/05/14 0353    No acute intracranial abnormality.     Signed by: Nancy Nordmann, MD  [phone]

## 2014-05-05 NOTE — Progress Notes (Addendum)
Received pt. From [facility] 2517A to Room 2533. Pt. is nonverbal. OOB to BRM with one person assist. SR on tele, BP high. Allow permissibly high BP for today and tomorrow. Will continue monitoring tele,VS and neuro assess. Continue contact isolation for +MDR. Continue 1:1 sitter for pt.'s agitation, safety/fall prevention.

## 2014-05-05 NOTE — Plan of Care (Signed)
Problem: Day of Admission- Stroke  Goal: Neurovascular Status is Stable or Improving  Outcome: Progressing  The patient and care giver's learning abilities have been assessed. Today's individualized plan of care to stroke, continue monitoring tele,VS and neuro assess, carotid ULS, medication administer as Order, Pureed and honey thick liquid, Neurology consult, Safety/ fall prevention was discussed with the patient and care giver and agree to it. Patient and care giver demonstrates understanding of disease process, treatment plan, medications and consequences of noncompliance. All questions and concerns were addressed.

## 2014-05-05 NOTE — Progress Notes (Signed)
Patient found by RN not as responsive, non verbal, short of breath. Patient eyes open, nonverbal, R sided flaccid, R facial droop.  BP 207/72, hr 112, BS 127.  Dr. Yehuda Savannah at bedside with orders for lab , chest xray and CT head ordered. ABG also ordered.  0330 Patient had a CT head no contrast, results negative. Awaiting MRI/MRA head.  Will keep monitoring.

## 2014-05-05 NOTE — Progress Notes (Signed)
PROGRESS NOTE    Date Time: 05/05/2014 12:30 PM  Patient Name: [name], [name]      Subjective:   Bun 24  crt 1.4Wbc 5.9  hct 29    Temp 98  SBP 149     MRI [name] MCA stroke    Review of Systems:   A comprehensive review of systems was: General ROS: negative for - chills, fever or night sweats  Psychological ROS: negative for - anxiety, depression, disorientation, hallucinations or suicidal ideation  ENT ROS: negative for - headaches, nasal congestion, sinus pain or visual changes  Allergy and Immunology ROS: negative for - itchy/watery eyes, nasal congestion or postnasal drip  Hematological and Lymphatic ROS: negative for - bleeding problems, bruising, pallor or weight loss  Endocrine ROS: negative for - malaise/lethargy or polydipsia/polyuria  Respiratory ROS: negative for - cough, orthopnea, shortness of breath or wheezing  Cardiovascular ROS: negative for - chest pain, dyspnea on exertion, orthopnea or shortness of breath  Gastrointestinal ROS: negative for - abdominal pain, constipation, diarrhea or nausea/vomiting  Genito-Urinary ROS: negative for - change in urinary stream, dysuria, hematuria or nocturia  Musculoskeletal ROS: negative for - joint pain, joint stiffness or joint swelling  Neurological ROS: negative for - confusion, dizziness, headaches, memory loss or speech problems  Dermatological ROS: negative for pruritus, rash and skin lesion changes    Physical Exam:     Filed Vitals:    05/05/14 0801   BP: 159/74   Pulse: 115   Temp: 97.7 F (36.5 C)   Resp: 18   SpO2: 93%       General appearance -awake  Mental status -awake talking  Eyes - pupils equal and reactive, extraocular eye movements intact  Nose - normal and patent, no erythema, discharge or polyps  Mouth - mucous membranes moist, pharynx normal without lesions  Neck - supple, no significant adenopathy  Lymphatics - no palpable lymphadenopathy, no hepatosplenomegaly  Chest - clear to auscultation, no wheezes, rales or rhonchi, symmetric air  entry  Heart - normal rate, regular rhythm, normal S1, S2, no murmurs, rubs, clicks or gallops  Abdomen - soft, nontender, nondistended, no masses or organomegaly  Neurological - awake   Musculoskeletal - no joint tenderness, deformity or swelling  Extremities - peripheral pulses normal, no pedal edema, no clubbing or cyanosis  Skin - normal coloration and turgor, no rashes, no suspicious skin lesions noted    Medications:     Current Facility-Administered Medications   Medication Dose Route Frequency   . albuterol-ipratropium  3 mL Nebulization Q6H SCH   . amLODIPine  10 mg Oral Daily   . aspirin EC  81 mg Oral Daily   . atorvastatin  10 mg Oral QHS   . calcitRIOL  0.25 mcg Oral Daily   . cloNIDine  0.2 mg Oral BID   . darbepoetin alfa (ARANESP) injection  40 mcg Subcutaneous Weekly   . docusate sodium  100 mg Oral Daily   . fluticasone-salmeterol  1 puff Inhalation BID   . heparin (porcine)  5,000 Units Subcutaneous Q12H [facility]   . hydrALAZINE  100 mg Oral Q8H SCH   . isosorbide mononitrate  60 mg Oral Daily   . terazosin  5 mg Oral QHS   . venlafaxine  25 mg Oral BID       Intake and Output Summary (Last 24 hours) at Date Time  No intake or output data in the 24 hours ending 05/05/14 1230  Labs:       Recent Labs  Lab 05/05/14  0318 05/04/14  0507 05/03/14  0205 05/02/14  0157   SODIUM 141 141 141 140   POTASSIUM 3.9 3.6 3.4* 3.6   CHLORIDE 106 105 106 103   CO2 24 25 25 28    BUN 14.0 17.0 24.0* 29.0*   CREATININE 1.4* 1.4* 1.6* 1.9*   CALCIUM 8.4* 8.1* 8.1* 8.2*   ALBUMIN 2.7* 2.5* 2.3* 2.6*   PROTEIN, TOTAL 6.7  --  5.5* 6.0   BILIRUBIN, TOTAL 0.4  --  0.4 0.4   ALKALINE PHOSPHATASE 97  --  83 98   ALT 15  --  14 17   AST (SGOT) 25  --  16 16   GLUCOSE 134* 87 87 98       Recent Labs  Lab 05/04/14  0507   WBC 4.32   HEMOGLOBIN 8.9*   HEMATOCRIT 29.0*   PLATELETS 209               Rads:     Radiology Results (24 Hour)     Procedure Component Value Units Date/Time    MRI Brain WO Contrast [[id number]]  Collected:  05/05/14 0744    Order Status:  Completed Updated:  05/05/14 0846    Narrative:      MRI BRAIN     CLINICAL INDICATION: Acute Change in Mental Status    COMPARISONS: Head CT, same date    TECHNIQUE: Standard multi-sequence and multi-planar brain MRI without  contrast.     FINDINGS:      Slightly limited examination due to motion artifact.    True restricted diffusion involving the left insula and anterior  temporal lobe (sequence 3 image 32-40) is concerning for acute ischemia.    There is no evidence of acute intra-or extra axial fluid collection,  hemorrhage, mass, or mass effect.      A few scattered periventricular increased T2/FLAIR foci are likely  sequela chronic microvascular ischemic changes.    Axial hemo sequence demonstrates 2 small blooming artifact foci in the  left parietal lobe (sequence 8 image 10) suggesting probable chronic  microhemorrhage.    The ventricles, basal cisterns and sulci are normal for patient's age.  The major flow-voids are unremarkable.        The visualized orbits, mastoid air cells and paranasal sinuses are  unremarkable.      Impression:           Acute ischemia involving the left insular and left anterior temporal  lobe (left MCA distribution). No associated evidence of acute  hemorrhage.      This critical result was reported to the inpatient nurse caring for the  patient, Nurse Shanda Bumps, by telephone on 05/05/2014 at 8:07 AM, who  acknowledged and repeated the result.    Max Fickle, MD   05/05/2014 8:42 AM      XR Chest AP Portable [[id number]] Collected:  05/05/14 0709    Order Status:  Completed Updated:  05/05/14 0716    Narrative:      INDICATION: resp distress    TECHNIQUE: Portable chest     COMPARISON: Multiple examinations with the latest dated 05/01/2014      Impression:      FINDINGS/    No significant change from most recent examination.    Stable likely congestive heart failure with cardiomegaly, pulmonary  edema, right pleural effusion, and  right greater than left bibasilar  density. No pneumothorax. Superimposed  infection cannot be excluded.                   Max Fickle, MD   05/05/2014 7:12 AM      CT Head WO Contrast [[id number]] Collected:  05/05/14 0353    Order Status:  Completed Updated:  05/05/14 0358    Narrative:      History: Altered mental status.    Technique: Axial CT brain obtained from vertex to skull base without  contrast.     Comparisons: None.    Findings:  Gray-white matter differentiation preserved.    Basal ganglia, thalami, midbrain, pons unremarkable.    No mass, mass effect, or midline shift. No intra-axial or extra-axial  hemorrhage or collection.    Ventricles, sulci, cisterns age-appropriate in size and configuration.    Calvarium appears intact.     Soft tissue unremarkable.      Impression:        No acute intracranial abnormality.    Adaline Sill, MD   05/05/2014 3:54 AM              Assessment:     Patient Active Problem List   Diagnosis   . NSTEMI (non-ST elevated myocardial infarction)   . Hypertensive urgency   . PAD (peripheral artery disease)   . Arterial leg ulcer   . Chronic obstructive pulmonary disease, unspecified COPD type   . Ischemic cardiomyopathy   . CAD (coronary artery disease)   . Diabetes   . Chronic kidney disease   . Debilitated   . Complete heart block   . Junctional bradycardia   . Hyperkalemia   . Renal failure   . Bradycardia   . Acute on chronic renal failure   . Encephalopathy   . Azotemia   . Coronary artery disease involving native coronary artery, angina presence unspecified, unspecified whether native or transplanted heart       Plan:   Bradycardia  Junctional rhythm  Off BB     Hx CAD    Hx cardiomyopathy  Hx aortic stenosis     Acute on chronic RF   On HD    Encephalopathy  metablolic   resolved      DM SSI monitor glucose      R/O sepsis  Hypothermic resolved  Evaluated by ID  UC bld cx on invanz   cx proteus     COPD duoneb    Hypertension on hydralazine and norvasc  Signed by:  Laveda Norman, MD  05/05/2014  12:30 PM

## 2014-05-05 NOTE — RRT Follow Up Note (Addendum)
Denzil Magnuson, MD  Internal Medicine      RRT Note    [name] is a  64 y.o.  female admitted 04/28/2014 with Diabetes mellitus [E11.9]  .  The Rapid Response Team was activated on 05/05/2014 at A2517/A2517-A  for unresponsiveness, right sided weakness and facial droop.      Currently the patients mental status:  []   Responsive   []    Awake    []  Alert    []  Oriented    []   x1    []   x2   []   x3     [x]   Unresponsive  []   Sedated  []   Somnolent  []   Confused  []   Lethargic      Vital signs are: Blood pressure 153/71, pulse 124, temperature 98 F (36.7 C), temperature source Oral, resp. rate 21, height 1.549 m (5\' 1" ), weight 69.4 kg (153 lb), SpO2 95 %.   Vital signs ranges: Temp:  [97.3 F (36.3 C)-98.5 F (36.9 C)] 98 F (36.7 C)  Heart Rate:  [82-126] 124  Resp Rate:  [16-21] 21  BP: (143-207)/(57-92) 153/71 mmHg      Admitting Diagnosis:  Diabetes mellitus [E11.9]     Problem List:    ICD-10-CM    1. Chronic obstructive pulmonary disease, unspecified COPD type J44.9 Critical Care     Critical Care   2. Sick R69 Triple Lumen Dialysis Cath     Triple Lumen Dialysis Cath   3. Coronary artery disease involving native coronary artery, angina presence unspecified, unspecified whether native or transplanted heart I25.10 Critical Care     Critical Care   4. Bradycardia R00.1 Critical Care     Critical Care     Critical Care     Critical Care   5. Acute on chronic renal failure N17.9 Critical Care    N18.9 Critical Care   6. Encephalopathy G93.40 Critical Care     Critical Care     Critical Care     Critical Care   7. Coronary artery disease involving native coronary artery without angina pectoris, unspecified whether native or transplanted heart I25.10 Critical Care     Critical Care   8. Renal failure N19 Critical Care     Critical Care       Medications:  Current Facility-Administered Medications   Medication Dose Route Frequency Provider Last Rate Last Dose   . acetaminophen (TYLENOL) tablet 650 mg  650 mg Oral  Q4H PRN Silis, Charlann Noss, MD   650 mg at 04/30/14 2300    Or   . acetaminophen (TYLENOL) suppository 650 mg  650 mg Rectal Q4H PRN Silis, Charlann Noss, MD       . acetaminophen (TYLENOL) tablet 650 mg  650 mg Oral Q4H PRN Silis, Charlann Noss, MD   650 mg at 04/29/14 0420   . albuterol-ipratropium (DUO-NEB) 2.5-0.5(3) mg/3 mL nebulizer 3 mL  3 mL Nebulization Q6H SCH Esmond Camper, NP   3 mL at 05/05/14 0322   . ALPRAZolam (XANAX) tablet 0.25 mg  0.25 mg Oral Q6H PRN Al Sol Blazing A, MD   0.25 mg at 05/03/14 1845   . amLODIPine (NORVASC) tablet 10 mg  10 mg Oral Daily Phillips Climes, MD   10 mg at 05/04/14 5284   . aspirin EC tablet 81 mg  81 mg Oral Daily Fortino Sic, MD   81 mg at 05/04/14 1324   . atorvastatin (LIPITOR) tablet 10  mg  10 mg Oral QHS Silis, Charlann Noss, MD   10 mg at 05/04/14 2211   . atropine injection 0.5 mg  0.5 mg Intravenous PRN Jones Skene, MD       . bisacodyl (DULCOLAX) suppository 10 mg  10 mg Rectal QD PRN Silis, Charlann Noss, MD       . calcitRIOL (ROCALTROL) capsule 0.25 mcg  0.25 mcg Oral Daily Silis, Charlann Noss, MD   0.25 mcg at 05/04/14 0917   . cloNIDine (CATAPRES) tablet 0.2 mg  0.2 mg Oral BID Al Khouri, Samir A, MD   0.2 mg at 05/04/14 1726   . darbepoetin alfa (ARANESP) injection 40 mcg  40 mcg Subcutaneous Weekly Cristal Ford, MD   40 mcg at 05/01/14 1727   . dextrose 50 % bolus 25 mL  25 mL Intravenous PRN Silis, Charlann Noss, MD       . dextrose 50 % bolus 25 mL  25 mL Intravenous PRN Sherley Bounds, Subodhkumar, MD       . dextrose 50 % bolus 25 mL  25 mL Intravenous PRN Al Khouri, Samir A, MD       . docusate sodium (COLACE) capsule 100 mg  100 mg Oral Daily Silis, Charlann Noss, MD   100 mg at 05/02/14 1048   . fentaNYL (SUBLIMAZE) injection 12.5 mcg  12.5 mcg Intravenous Q4H PRN Jones Skene, MD   12.5 mcg at 04/30/14 2120   . fluticasone-salmeterol (ADVAIR DISKUS) 250-50 MCG/DOSE 1 puff  1 puff Inhalation BID Silis, Charlann Noss, MD   1 puff at 05/04/14 1949   . glucagon  (rDNA) (GLUCAGEN) injection 1 mg  1 mg Intramuscular PRN Silis, Charlann Noss, MD       . glucagon (rDNA) (GLUCAGEN) injection 1 mg  1 mg Intramuscular PRN Jones Skene, MD       . glucagon (rDNA) (GLUCAGEN) injection 1 mg  1 mg Intramuscular PRN Al Khouri, Samir A, MD       . heparin (porcine) injection 5,000 Units  5,000 Units Subcutaneous Q12H [facility] Silis, Charlann Noss, MD   5,000 Units at 05/04/14 2211   . hydrALAZINE (APRESOLINE) injection 10 mg  10 mg Intravenous Q6H PRN Smirniotopoulos, Norton Pastel, MD   10 mg at 05/03/14 0825   . hydrALAZINE (APRESOLINE) tablet 100 mg  100 mg Oral Q8H SCH Silis, Charlann Noss, MD   100 mg at 05/04/14 1726   . insulin aspart (NovoLOG) injection 1-3 Units  1-3 Units Subcutaneous QHS PRN Al Carlyon Prows, MD   1 Units at 05/03/14 2207   . insulin aspart (NovoLOG) injection 1-5 Units  1-5 Units Subcutaneous PRN Jones Skene, MD       . insulin aspart (NovoLOG) injection 1-5 Units  1-5 Units Subcutaneous TID AC PRN Al Khouri, Samir A, MD       . isosorbide mononitrate (IMDUR) 24 hr tablet 60 mg  60 mg Oral Daily Darnelle Maffucci, MD   60 mg at 05/04/14 0981   . naloxone [facility]) injection 0.2 mg  0.2 mg Intravenous PRN Silis, Charlann Noss, MD       . polyethylene glycol (MIRALAX) packet 17 g  17 g Oral QD PRN Silis, Charlann Noss, MD       . terazosin (HYTRIN) capsule 5 mg  5 mg Oral QHS Al Khouri, Samir A, MD   5 mg at 05/04/14 2211   . venlafaxine (EFFEXOR) tablet 25 mg  25 mg Oral BID Jones Skene, MD  25 mg at 05/04/14 1726   . zolpidem (AMBIEN) tablet 5 mg  5 mg Oral QHS PRN Silis, Manny P, MD   5 mg at 05/04/14 2211         Medications Administered during RRT:     Sedation:   []   yes     []   no      Anxiolytics:  []   yes     []   no     Antiarrhythmics:  []   yes     []   no     Antihypertensives:  []   yes     []   no     Pressors:   []   yes     []   no     IVF's:   []   yes     []   no     Diuretics:   [x]   yes     []   no    Work-up:     []    Labs:        []   CBC    [x]   CMP     []   ABG's    [x]   Supergas     []   CardiacMarkers                                     []      D-Dimer         [x]    Imaging:                               [x]   Portable Chest Xray                   [x]   CT Scan:    [x]    Head     []   Chest     []   Abdomen        []   CT Angiogram     []   Head       []   Neck      []    Chest    []    Abdomen          []    EKG:   []   yes     []   no        [x]    Intubation:   []   yes     [x]   no        [x]    Transfer:     []   yes     [x]   no      []   Telemetry     []   ICU                Assessment/Plan:    RRT was activated because of unresponsiveness and right sided weakness and facial droop. The above W/U was done and was negative. I asked RN Renaee Munda about the on call neurologist and he stated to be Dr. Jeani Hawking, I asked him to page her for me and he handed me his telephone and I found a female neurologist speaking whom I did not recognize and I discussed the case with him in details who recommended MRI/MRA of the head and neck. I am awaiting the technician to come to the hospital to have these imaging studies done. I received a call from a lady ( whom I think to be the MRI  technician) who asked me " can the MRI be done tomorrow morning when the MRI technician comes in " and I answered "NO", I want the MRI to be done now and the conversation was ended.  I did not receive any calls regarding the MRI result from any source.    Please refer to RRT Record Sheet.       Signed by: Koren Bound

## 2014-05-05 NOTE — SLP Eval Note (Signed)
[facility]  Speech and Language Therapy Bedside Swallow Evaluation     Patient: [name]    MRN#: [id number]     Time of Treatment:    1220  1240          Consult received for [name] for SLP Bedside Swallow Evaluation and Treatment.    Medical Diagnosis: Diabetes mellitus [E11.9]    History of Present Illness: CHESNEE FLOREN is a 64 y.o. female admitted on 04/28/2014 with   JAVON HUPFER is a 64 y.o. female who presents to the hospital with hx CAD s/p CABG 2015 non healing RLE ulcer  HTN HLD CKD AS EF 45 %     P[facility] to [facility] dizziness fatigue brady junctional rhythm  Acute on CRF    [facility]   For eval acute on CRF and pacer   MRI contrast 2/18:  IMPRESSION:   Acute ischemia involving the left insular and left anterior temporal  lobe (left MCA distribution). No associated evidence of acute  hemorrhage.  This critical result was reported to the inpatient nurse caring for the  patient, Nurse Shanda Bumps, by telephone on 05/05/2014 at 8:07 AM, who  acknowledged and repeated the result.  Patient Active Problem List   Diagnosis   . NSTEMI (non-ST elevated myocardial infarction)   . Hypertensive urgency   . PAD (peripheral artery disease)   . Arterial leg ulcer   . Chronic obstructive pulmonary disease, unspecified COPD type   . Ischemic cardiomyopathy   . CAD (coronary artery disease)   . Diabetes   . Chronic kidney disease   . Debilitated   . Complete heart block   . Junctional bradycardia   . Hyperkalemia   . Renal failure   . Bradycardia   . Acute on chronic renal failure   . Encephalopathy   . Azotemia   . Coronary artery disease involving native coronary artery, angina presence unspecified, unspecified whether native or transplanted heart        Past Medical/Surgical History:  Past Medical History   Diagnosis Date   . Diabetes mellitus    . Chronic kidney disease    . Myocardial infarction    . Hypertension    . Hyperlipidemia    . Peripheral arterial disease      06/28/13 right  superficial femoral artery stenosis, tibial peroneal trunk stenosis, left CVL a stenosis, treated with angiography and angioplasty.   . Coronary artery disease      Left circumflex artery presumed DES.,  Chronically occluded right coronary artery stent   . Anemia      She redid to chronic disease and iron deficiency.     . Arterial leg ulcer      2014, receiving home health   . Chronic obstructive pulmonary disease      Previously on Spiriva and Advair      Past Surgical History   Procedure Laterality Date   . Cardiac angiography and angioplasty  06/2013   . Colonoscopy  Unknown   . Tubal ligation     . Coronary artery bypass N/A 03/04/2014     Procedure: CORONARY ARTERY BYPASS;  Surgeon: Austin Miles, MD;  Location: [facility] HEART OR;  Service: Cardiovascular;  Laterality: N/A;  LIMA to LAD  SVG to OM   . Endoscopic,vein harvest Left 03/04/2014     Procedure: ENDOSCOPIC,VEIN HARVEST;  Surgeon: Austin Miles, MD;  Location: [facility] HEART OR;  Service: Cardiovascular;  Laterality: Left;  By Nestor Lewandowsky, RNFA. Left groin to mid-calf.   Rhae Hammock N/A 03/04/2014     Procedure: TEE;  Surgeon: Austin Miles, MD;  Location: [facility] HEART OR;  Service: Cardiovascular;  Laterality: N/A;  probe [phone]         History/Current Status:  History/Current Status  Respiratory Status: O2 via nasal cannula  Behavior/Mental Status: Awake/alert, Cooperative  Nutrition: oral  Diet Prior to Study: regular, thin liquids    Subjective: Patient is agreeable to participation in the therapy session. Nursing clears patient for therapy. Patient's medical condition is appropriate for Speech therapy intervention at this time. Pt is confused and non verbal     Objective:  Observation of Patient/Vital Signs:  Patient is in bed with telemetry in place.    Oral Motor Skills:  Engineer, maintenance (IT) Skills: exceptions to [facility]  Oral Motor Impairments: coordination, strength, apraxia, dysarthria    Deglutition Skills:  Deglutition Skills  Position:  upright 90 degrees  Food(s) Tested: thin liquid, nectar thick liquid, honey thick liquid, puree, solid  Oral Stage: lip closure reduced, bolus formation/control reduced, AP propulsion reduced, residuals  Oral Stage Residuals: not cleared  Pharyngeal Stage: delayed response    Assessment:     Pt presents with mod oral pharyngeal dysphagia characterized by increase in A/P transit times with pureed and absent oral prep stage with solid trial. Pt was able to elicit pharyngeal swallow trigger approximately 3 to 5 seconds delayed. There was mild reduction in Hyolaryngeal excursion per digital palpation. Overall swallow function appeared sluggish. Pt exhibited immediate strong cough resulting from nectar thickened liquid trial via cup and spoon. Honey thickened trial was well tolerated via spoon without any overt s/s aspiration or penetration  D/w patient and RN diet texture recommendation and safe swallowing precautions. Pt may also be high fall risk 2/2 restlessness. Communicated the patient's status with RN  Goals:  Pt will tolerate pureed texture and honey thickened liquids diet without s/s aspiration or penetration x 48 hours  Patient will adhere to swallow safety precautions 100% of the time during meals with max assistance with  meals      Plan/Recommendations:     Follow up treatments: oral motor exercises, pharyngeal exercises, diet tolerance monitoring, strategies training     Diet Solids Recommendation: dysphagia pureed  Diet Liquids Recommendations: honey thick consistency  Precautions/Compensations: Awake/alert, Upright 90 degrees for all oral intake, 45 degrees upright after meals, Alternate solids and liquids, Swallow multiple times per bite/sip, Small bites/sips, Eat/feed slowly  Recommendation Discussed With: : Patient, Nurse  Administration of Medications: crushed with apple sauce  Aspiration Precautions posted at bedside: d/w patient and RN  Presley Raddle MS [facility] SLP   05/05/2014 2:13 PM  [phone]

## 2014-05-05 NOTE — Progress Notes (Signed)
Currently pt is able to move Rt upper and lower extremities but she has rt sided weakness, attempts to speak but no words, still not following command but smiles. Daughter Angelena Sole made aware, awaiting attending to call back.

## 2014-05-05 NOTE — Plan of Care (Signed)
Problem: Neurological Deficit  Goal: Neurological status is stable or improving  Outcome: Progressing   Pt unable to respond to command, non verbal, flaccid on the right side of the body, neglecting rt side, VSS, St on tele HR in the 120s, CT of head - no bleed, neurology consulted, MRI ordered and awaiting for a tech to perform test, will continue to monitor neuro status and report significant changes.    Intervention: Monitor/assess Glasgow Coma Scale.  .  Intervention: Monitor/assess Complex Neurological Assessment  .  Intervention: Monitor/assess NIH Stroke Scale.  .  Intervention: Report to LIP changes from baseline.  .  Intervention: Maintain safe environment  .  Intervention: Assess patient's risk for falls and implement fall prevention plan of care per policy  .

## 2014-05-05 NOTE — Consults (Signed)
[facility] Progress Note    Date Time: 05/05/2014 11:41 AM  Patient Name: [name], [name]    CC: Facial droop    Assessment:   64 y/o female with multiple cardiovascular risk factors who was found with R facial droop, R side weakness overnight, now with acute [name] MCA territory CVA. She remains aphasic. On ASA. Given acuity and appearance on imaging, differential is embolic vs thrombotic.    Patient Active Problem List   Diagnosis   . NSTEMI (non-ST elevated myocardial infarction)   . Hypertensive urgency   . PAD (peripheral artery disease)   . Arterial leg ulcer   . Chronic obstructive pulmonary disease, unspecified COPD type   . Ischemic cardiomyopathy   . CAD (coronary artery disease)   . Diabetes   . Chronic kidney disease   . Debilitated   . Complete heart block   . Junctional bradycardia   . Hyperkalemia   . Renal failure   . Bradycardia   . Acute on chronic renal failure   . Encephalopathy   . Azotemia   . Coronary artery disease involving native coronary artery, angina presence unspecified, unspecified whether native or transplanted heart       Plan:   -Brain MRI reviewed. Acute ischemia noted [name] insular [name] anterior temporal lobe.   -Will check carotid dopplers  -Continue ASA  -Labs; Check lipid panel. LDL goal <70, increase statin as indicated  -Echo 04/26/14 no intracardiac mass or thrombus seen, no ASD or PFO; EF 45%  - She will need speech therapy, PT/OT  -Allow permissive hypertension for 24 hours from stroke onset, do not treat for SBP<200 if OK with cardiology  -D/W pt's family at bedside  -D/W nursing staff     Attending note:    The patient was seen and examined by me. History was independently reviewed and confirmed by me. All pertinent parts of the neurological exam were performed and confirmed by me, and edited below. I agree with the assessment and plan as outlined by the mid-level provider as above, with any additional considerations or recommendations as noted both above and  here.      Johnston Ebbs, MD  [facility]      HPI   [name] is a 64 y.o. female with PMH CAD, AMI, s/p CABG 12/15, HTN, HLD, CHF with EF 45%, DM, non-healing R foot ulcer, who presented to [facility] 04/22/14 with complaints of dizziness and fatigue for which Medicine team was following. She was transferred to [facility] 04/28/14 for management of bradycardia with HR in the 40s, evaluated for external pacemaker and management of acute on chronic renal failure. She was noted to have some encephalopathy during admission thought partially due to metabolic derangements and borderline UTI. During the course of her admission she was followed by multiple specialists, including Pulmonology, Cardiology, wound management and ID. She seemed to be improving overall and was getting ready for D/C. However, Neurology is consulted at approximately 10:00 this morning after an RRT was called last night at approximately 3:16 am when pt was found less responsive, non-verbal and with R side weakness and R facial droop. Her BP was elevated to 207/72. She was sent for non-contrast HCT which was negative. A subsequent brain MRI revealed acute ischemia involving the left insular and left anterior temporal lobe (left MCA distribution). Pt remains non-verbal, unable to answer subjective questioning. Pt apparently was called not tPA candidate 2/2 recent AMI during 02/28/14 hospital admission,  though this is not a tPA contraindication.  Unclear which physician was contacted last night during acute stroke as our on-call team was not contacted.    Past Medical Hx     Past Medical History   Diagnosis Date   . Diabetes mellitus    . Chronic kidney disease    . Myocardial infarction    . Hypertension    . Hyperlipidemia    . Peripheral arterial disease      06/28/13 right superficial femoral artery stenosis, tibial peroneal trunk stenosis, left CVL a stenosis, treated with angiography and angioplasty.   .  Coronary artery disease      Left circumflex artery presumed DES.,  Chronically occluded right coronary artery stent   . Anemia      She redid to chronic disease and iron deficiency.     . Arterial leg ulcer      2014, receiving home health   . Chronic obstructive pulmonary disease      Previously on Spiriva and Advair      Past Surgical Hx:     Past Surgical History   Procedure Laterality Date   . Cardiac angiography and angioplasty  06/2013   . Colonoscopy  Unknown   . Tubal ligation     . Coronary artery bypass N/A 03/04/2014     Procedure: CORONARY ARTERY BYPASS;  Surgeon: Austin Miles, MD;  Location: [facility] HEART OR;  Service: Cardiovascular;  Laterality: N/A;  LIMA to LAD  SVG to OM   . Endoscopic,vein harvest Left 03/04/2014     Procedure: ENDOSCOPIC,VEIN HARVEST;  Surgeon: Austin Miles, MD;  Location: [facility] HEART OR;  Service: Cardiovascular;  Laterality: Left;  By D. Lazarus Gowda, RNFA. Left groin to mid-calf.   Rhae Hammock N/A 03/04/2014     Procedure: TEE;  Surgeon: Austin Miles, MD;  Location: [facility] HEART OR;  Service: Cardiovascular;  Laterality: N/A;  probe [phone]        Family Medical History:      Family History   Problem Relation Age of Onset   . Coronary artery disease Mother      Died at 63 from MI.   Marland Kitchen Coronary artery disease Father      Died at 61 from MI   . Diabetes type II       Mother and father       Social Hx     History     Social History   . Marital Status: Widowed     Spouse Name: N/A     Number of Children: N/A   . Years of Education: N/A     Occupational History   . Not on file.     Social History Main Topics   . Smoking status: Former Smoker -- 1.00 packs/day for 20 years     Quit date: 02/29/2012   . Smokeless tobacco: Not on file   . Alcohol Use: Not on file   . Drug Use: Not on file   . Sexual Activity: Not on file     Other Topics Concern   . Not on file     Social History Narrative       Medications:     Current Facility-Administered Medications   Medication Dose Route Frequency   .  albuterol-ipratropium  3 mL Nebulization Q6H SCH   . amLODIPine  10 mg Oral Daily   . aspirin EC  81 mg Oral Daily   . atorvastatin  10 mg Oral QHS   .  calcitRIOL  0.25 mcg Oral Daily   . cloNIDine  0.2 mg Oral BID   . darbepoetin alfa (ARANESP) injection  40 mcg Subcutaneous Weekly   . docusate sodium  100 mg Oral Daily   . fluticasone-salmeterol  1 puff Inhalation BID   . heparin (porcine)  5,000 Units Subcutaneous Q12H [facility]   . hydrALAZINE  100 mg Oral Q8H SCH   . isosorbide mononitrate  60 mg Oral Daily   . terazosin  5 mg Oral QHS   . venlafaxine  25 mg Oral BID       Review of Systems:   Neurological ROS: Pt unable     Physical Exam:   Temp:  [97.3 F (36.3 C)-98.5 F (36.9 C)] 97.7 F (36.5 C)  Heart Rate:  [82-126] 115  Resp Rate:  [16-21] 18  BP: (148-207)/(57-92) 159/74 mmHg    Vital Signs:  Reviewed    General: The patient was well developed female, sitting up in bed, family at bedside.   Neck:  no carotid bruits  CVS: RRR, no murmurs, rubs,or gallops  Resp: CTA bilaterally, no retractions  Abd: Soft, nontender  Extremities: no pedal edema, extremities normal in color, RLE wrapped in bandage     Mental Status: The patient was awake, alert, follows some simple commands. She has expressive >receptive aphasia, no speech. Affect is flat.     Cranial nerves:   -CN II: UTA  -CN III, IV, VI: Pupils irregular, R 4 to 3 mm, sluggish, [name] 3 to 2 mm, reactive; Extraocular movements intact; no ptosis          -CN V: Facial sensation UTA  -CN VII: R facial asymmetry  -CN VIII: Hearing intact to conversational speech   -CN IX, X: Palate elevates symmetrically; normal phonation   -CN XI: Symmetric full strength of sternocleidomastoid and trapezius muscles   -CN XII: Does not protrude tongue    Motor: Muscle tone decreased bilateral lower extremities. She has R pronator drift. She is 3+/5 RUE, 3+/5 RLE. She is full strength on left.     Sensory: WD on R  Intact to light touch on left    Reflexes:  R / [name]     R /  [name]  Biceps  2 / 2  Knees  1 /1  Triceps 2 / 2  Ankles  1 / 1  BR                   2 / 2  Plantars Flexor / Flexor    Coordination: RAMs slowed on right. No tremors    Gait: NT    Labs:     Results     Procedure Component Value Units Date/Time    Glucose Whole Blood - POCT [[id number]]  (Abnormal) Collected:  05/05/14 1120     POCT - Glucose Whole blood 140 (H) mg/dL Updated:  40/98/11 9147    Glucose Whole Blood - POCT [[id number]]  (Abnormal) Collected:  05/05/14 0759     POCT - Glucose Whole blood 160 (H) mg/dL Updated:  86/57/84 6962    PT/APTT [[id number]]  (Abnormal) Collected:  05/05/14 0333     PT 13.6 sec Updated:  05/05/14 0552     PT INR 1.0      PT Anticoag. Given Within 48 hrs. heparin      PTT 38 (H) sec     Comprehensive metabolic panel [[id number]]  (Abnormal) Collected:  05/05/14 0318    Specimen Information:  Blood Updated:  05/05/14 0347     Glucose 134 (H) mg/dL      BUN 16.1 mg/dL      Creatinine 1.4 (H) mg/dL      Sodium 096 mEq/[name]      Potassium 3.9 mEq/[name]      Chloride 106 mEq/[name]      CO2 24 mEq/[name]      CALCIUM 8.4 ([name]) mg/dL      Protein, Total 6.7 g/dL      Albumin 2.7 ([name]) g/dL      AST (SGOT) 25 U/[name]      ALT 15 U/[name]      Alkaline Phosphatase 97 U/[name]      Bilirubin, Total 0.4 mg/dL      Globulin 4.0 (H) g/dL      Albumin/Globulin Ratio 0.7 ([name])      Anion Gap 11.0        Rads:   Ct Head Wo Contrast    05/05/2014    No acute intracranial abnormality.  Adaline Sill, MD  05/05/2014 3:54 AM     Mri Brain Wo Contrast    05/05/2014      Acute ischemia involving the left insular and left anterior temporal lobe (left MCA distribution). No associated evidence of acute hemorrhage.   This critical result was reported to the inpatient nurse caring for the patient, Nurse Shanda Bumps, by telephone on 05/05/2014 at 8:07 AM, who acknowledged and repeated the result.  Max Fickle, MD  05/05/2014 8:42 AM     US Venous Low Extrem Duplx Dopp Uni Right    05/03/2014    No evidence of deep or superficial venous thrombosis  in the right lower extremity.  Alphonzo Grieve, MD  05/03/2014 5:41 PM       Signed by:   Teryl Lucy, Cordelia Poche, MMS  Physician Assistant  [place] [facility]  Spectra [phone]    Please see attending physician note that follows this mid-level encounter.

## 2014-05-05 NOTE — Plan of Care (Signed)
Problem: Speech Language Pathology  Goal: Patient condition is improving per Speech/Language Pathologist Treatment Plan therapy plan  Outcome: Progressing  Goals:  Pt will tolerate pureed texture and honey thickened liquids diet without s/s aspiration or penetration x 48 hours  Patient will adhere to swallow safety precautions 100% of the time during meals with max assistance with meals      Plan/Recommendations:     Follow up treatments: oral motor exercises, pharyngeal exercises, diet tolerance monitoring, strategies training     Diet Solids Recommendation: dysphagia pureed  Diet Liquids Recommendations: honey thick consistency  Precautions/Compensations: Awake/alert, Upright 90 degrees for all oral intake, 45 degrees upright after meals, Alternate solids and liquids, Swallow multiple times per bite/sip, Small bites/sips, Eat/feed slowly  Recommendation Discussed With: : Patient, Nurse  Administration of Medications: crushed with apple sauce  Aspiration Precautions posted at bedside: d/w patient and RN

## 2014-05-05 NOTE — PT Progress Note (Signed)
Physical Therapy Note    [facility]  Physical Therapy Attempt Note    Patient:  [name] MRN#:  [id number]  Unit:  [facility] Room/Bed:  A2130/Q6578-I       Physical Therapy treatment attempted on 05/05/2014, unable to complete secondary to as per am RN note, pt with altered mental status, RRT called at early morning, and MRI ordered and scheduled for today.      RN notified. Will follow up when pt appropreate.      Coralyn Helling, [place]  [id number]    [facility] PM&R    05/05/2014  9:27 AM

## 2014-05-05 NOTE — Progress Notes (Signed)
Pt was last seen normal at 0130. Around 0300 Primary RN went to pt's room and found pt attempting to get OOB with pt's head at the foot of the bed. Per Primary RN, Pt was straightened up and HOB elevated and RRT initiated secondary to Pt being unresponsive.  This RN went to the room and on  assessment, pt was aphasic, sleepy but arousable to voice and touch, R facial droop, R side neglect, R arm flaccidity and R leg weakness. NIHSS cannot be done because pt cannot follow commands.   Head CT and  P CXR were done. H CT indicated no acute intracranial abnormality and Hospitalist read the Xray film. Dr Yehuda Savannah, Hospitalist spoke to Dr Jeani Hawking, (Neuro oncall) and MRI/MRA of Head and Neck were ordered. On call MRI Tech paged by Nocturnal X ray.

## 2014-05-05 NOTE — Progress Notes (Addendum)
Around 6 Rn found pt naked up side down on the bed attempting to get out of bed, pt was not able to respond to command, non verbal, labored breathing, eyes wide open, pt was unable to move the rt side of her body, RN called for help and moved pt back to the right side of the bed. RRT activated, BP was elevated at 207/92, pt was tachycardic in the 120s, other VS were normal, BG 124. Lab for ABG, CMP, and coags sent to lab, CT of head and CXR was done, neurology consulted per Dr. Yehuda Savannah, awaiting on MRI of head to be done, attending- Dr. Claudius Sis paged to notify incident- awaiting for a response. Pt was A&OX4 before going to bed and reported that she was looking forward to going home today, Ambien 5mg  was given prior bedtime. around 0130 pt called nursing station and requested help.  When the CT went to help her she reported that someone was in the room telling her to get up. CT assumed she was confused and he reassured her and encouraged her to go back to sleep. Currently Bp stable, ST on tele HR in the 120s, nonverbal, neglecting rt side, unable to follow command, unable to do NIH due to pt's condition of not following command.

## 2014-05-06 LAB — GLUCOSE WHOLE BLOOD - POCT
Whole Blood Glucose POCT: 178 mg/dL — ABNORMAL HIGH (ref 70–100)
Whole Blood Glucose POCT: 158 mg/dL — ABNORMAL HIGH (ref 70–100)

## 2014-05-06 MED ORDER — DEXTROSE-SODIUM CHLORIDE 5-0.45 % IV SOLN
INTRAVENOUS | Status: DC
Start: 2014-05-06 — End: 2014-05-12

## 2014-05-06 MED ORDER — CLOPIDOGREL BISULFATE 75 MG PO TABS
75.0000 mg | ORAL_TABLET | Freq: Every day | ORAL | Status: DC
Start: 2014-05-07 — End: 2014-05-14
  Administered 2014-05-07 – 2014-05-14 (×8): 75 mg via ORAL
  Filled 2014-05-06 (×7): qty 1

## 2014-05-06 NOTE — Plan of Care (Addendum)
Problem: Day 2- Stroke  Goal: Neurovascular Status is Stable or Improving  The patient's learning abilities have been assessed. Today's individualized plan of care was discussed with [name] and her family member [name], [name]. Patient and care giver demonstrate understanding of disease process, treatment plan, medications and consequences of noncompliance. All questions and concerns were addressed. Pt admitted for bradycardia, CAD and sudden stroke yesterday. Pt presenting global aphasia, follows commands and alert. LDL target less than 70. PT/OT unable to work with patient yet, will follow up with pt. Patient off antibiotics, no leukocytosis. Continue following up BP and giving medications. If patient drinks less than 100 ml today, IV fluids are suggested to keep her hydrated. Continue acuchecks, neurochecks, sitter, hourly rounding, fall safety plan and assisting patient when OOB.

## 2014-05-06 NOTE — Progress Notes (Signed)
[facility] Progress Note    Date Time: 05/06/2014 12:17 PM  Patient Name: [name], [name]    CC: R side weakness    Assessment:   64 y/o female with multiple cardiovascular risk factors who was found with R facial droop, R side weakness overnight, now with acute [name] MCA territory CVA. She remains aphasic. Given acuity and appearance on imaging, differential is embolic vs thrombotic vs hypoperfusion in setting of bradycardia. Event occurred while on ASA, will switch to Plavix.     Patient Active Problem List   Diagnosis   . NSTEMI (non-ST elevated myocardial infarction)   . Hypertensive urgency   . PAD (peripheral artery disease)   . Arterial leg ulcer   . Chronic obstructive pulmonary disease, unspecified COPD type   . Ischemic cardiomyopathy   . CAD (coronary artery disease)   . Diabetes   . Chronic kidney disease   . Debilitated   . Complete heart block   . Junctional bradycardia   . Hyperkalemia   . Renal failure   . Bradycardia   . Acute on chronic renal failure   . Encephalopathy   . Azotemia   . Coronary artery disease involving native coronary artery, angina presence unspecified, unspecified whether native or transplanted heart       Plan:   -D/C ASA. Add Plavix 75 mg daily  -Continue Statin; LDL goal <70  -Speech, PT/OT; recommend communication board given pt's aphasia  -Maintain normotension  -DVT prophylaxis    Attending note:    The patient was seen and examined by me. History was independently reviewed and confirmed by me. All pertinent parts of the neurological exam were performed and confirmed by me, and edited below. I agree with the assessment and plan as outlined by the mid-level provider as above, with any additional considerations or recommendations as noted both above and here.      Johnston Ebbs, MD  [facility]      Interval History/Subjective:   Pt remains with expressive aphasia. Able to follow some commands. Unable to answer subjective questioning. She does not appear  uncomfortable; resting quietly in bed, eyes open during exam.     Carotid dopplers with <40% stenosis.     Medications:   Reviewed    Current Facility-Administered Medications   Medication Dose Route Frequency   . albuterol-ipratropium  3 mL Nebulization Q6H SCH   . amLODIPine  10 mg Oral Daily   . aspirin EC  81 mg Oral Daily   . atorvastatin  40 mg Oral QHS   . calcitRIOL  0.25 mcg Oral Daily   . cloNIDine  0.2 mg Oral BID   . darbepoetin alfa (ARANESP) injection  40 mcg Subcutaneous Weekly   . docusate sodium  100 mg Oral Daily   . fluticasone-salmeterol  1 puff Inhalation BID   . heparin (porcine)  5,000 Units Subcutaneous Q12H [facility]   . hydrALAZINE  100 mg Oral Q8H SCH   . isosorbide mononitrate  60 mg Oral Daily   . terazosin  5 mg Oral QHS   . venlafaxine  25 mg Oral BID       Review of Systems:   Neurological ROS: Limited. Pt unable.     Physical Exam:   Temp:  [97.2 F (36.2 C)-98.1 F (36.7 C)] 97.5 F (36.4 C)  Heart Rate:  [95-108] 98  Resp Rate:  [18-20] 18  BP: (174-192)/(70-92) 186/77 mmHg    Vital Signs:  Reviewed  General: The patient was well developed and well nourished female lying in bed. NAD. Cooperative with some of exam.   Abd: Soft, does not appear tender to light palpation  Extremities: no pedal edema, extremities normal in color; right foot wrapped in dressing    Mental Status: The patient was awake and alert. She has expressive >receptive aphasia; she does follow some commands. Affect is flat.     Cranial nerves:   -CN III, IV, VI: Pupils equal, round, and reactive to light; extraocular movements intact; no ptosis              -CN VII: R facial asymmetry  -CN VIII: Hearing intact to conversational speech   -CN XII: Does not protrude tongue    Motor: Slight muscle atrophy bilateral lower extremities. R pronator drift. She remains with 3+5 strength on R upper and lower extremities. Strength intact 5/5 on the left.     Reflexes:  R / [name]     R / [name]  Biceps  2 / 2  Knees  1/1  Triceps 2 /  2  Ankles  1/1  BR                   2 / 2  Plantars Flexor / Flexor    Coordination: Does not participate in FTN testing. No overt tremors.     Gait: NT 2/2 safety concerns.     Labs:     Results     Procedure Component Value Units Date/Time    Glucose Whole Blood - POCT [[id number]]  (Abnormal) Collected:  05/05/14 2101     POCT - Glucose Whole blood 148 (H) mg/dL Updated:  78/29/56 2130    Lipid panel [[id number]]  (Abnormal) Collected:  05/05/14 1457    Specimen Information:  Blood Updated:  05/05/14 1927     Cholesterol 185 mg/dL      Triglycerides 865 mg/dL      HDL 53 mg/dL      LDL Calculated 784 (H) mg/dL      VLDL Cholesterol Cal 25 mg/dL      CHOL/HDL Ratio 3.5     Hemolysis index [[id number]]  (Abnormal) Collected:  05/05/14 1457     Hemolysis Index 48 (H) Updated:  05/05/14 1927    Glucose Whole Blood - POCT [[id number]]  (Abnormal) Collected:  05/05/14 1557     POCT - Glucose Whole blood 157 (H) mg/dL Updated:  13/24/40 1027          Rads:   Ct Head Wo Contrast    05/05/2014    No acute intracranial abnormality.  Adaline Sill, MD  05/05/2014 3:54 AM     Mri Brain Wo Contrast    05/05/2014      Acute ischemia involving the left insular and left anterior temporal lobe (left MCA distribution). No associated evidence of acute hemorrhage.   This critical result was reported to the inpatient nurse caring for the patient, Nurse Shanda Bumps, by telephone on 05/05/2014 at 8:07 AM, who acknowledged and repeated the result.  Max Fickle, MD  05/05/2014 8:42 AM       Signed by:   Teryl Lucy, Cordelia Poche, MMS  Physician Assistant  [place] [facility]  Spectra [phone]    Please see attending physician note that follows this mid-level encounter.

## 2014-05-06 NOTE — Progress Notes (Signed)
PROGRESS NOTE    Date Time: 05/06/2014 3:17 PM  Patient Name: [name],[name]      Subjective:   Bun 24  crt 1.4Wbc 5.9  hct 29    Temp 98  SBP 162    MRI L MCA stroke    Carotid neg    Alert RUE weakness improved    Expressive aphasia    Review of Systems:   A comprehensive review of systems was: General ROS: negative for - chills, fever or night sweats  Psychological ROS: negative for - anxiety, depression, disorientation, hallucinations or suicidal ideation  ENT ROS: negative for - headaches, nasal congestion, sinus pain or visual changes  Allergy and Immunology ROS: negative for - itchy/watery eyes, nasal congestion or postnasal drip  Hematological and Lymphatic ROS: negative for - bleeding problems, bruising, pallor or weight loss  Endocrine ROS: negative for - malaise/lethargy or polydipsia/polyuria  Respiratory ROS: negative for - cough, orthopnea, shortness of breath or wheezing  Cardiovascular ROS: negative for - chest pain, dyspnea on exertion, orthopnea or shortness of breath  Gastrointestinal ROS: negative for - abdominal pain, constipation, diarrhea or nausea/vomiting  Genito-Urinary ROS: negative for - change in urinary stream, dysuria, hematuria or nocturia  Musculoskeletal ROS: negative for - joint pain, joint stiffness or joint swelling  Neurological ROS: negative for - confusion, dizziness, headaches, memory loss or speech problems  Dermatological ROS: negative for pruritus, rash and skin lesion changes    Physical Exam:     Filed Vitals:    05/06/14 1418   BP:    Pulse:    Temp:    Resp:    SpO2: 98%       General appearance -awake  Mental status -awake expressive aphasia   Eyes - pupils equal and reactive, extraocular eye movements intact  Nose - normal and patent, no erythema, discharge or polyps  Mouth - mucous membranes moist, pharynx normal without lesions  Neck - supple, no significant adenopathy  Lymphatics - no palpable lymphadenopathy, no hepatosplenomegaly  Chest - clear to auscultation, no  wheezes, rales or rhonchi, symmetric air entry  Heart - normal rate, regular rhythm, normal S1, S2, no murmurs, rubs, clicks or gallops  Abdomen - soft, nontender, nondistended, no masses or organomegaly  Neurological - awake expressive aphasia RUE weakness  Musculoskeletal - no joint tenderness, deformity or swelling  Extremities - peripheral pulses normal, no pedal edema, no clubbing or cyanosis  Skin - normal coloration and turgor, no rashes, no suspicious skin lesions noted    Medications:     Current Facility-Administered Medications   Medication Dose Route Frequency   . albuterol-ipratropium  3 mL Nebulization Q6H [facility]   . amLODIPine  10 mg Oral Daily   . aspirin EC  81 mg Oral Daily   . atorvastatin  40 mg Oral QHS   . calcitRIOL  0.25 mcg Oral Daily   . cloNIDine  0.2 mg Oral BID   . darbepoetin alfa (ARANESP) injection  40 mcg Subcutaneous Weekly   . docusate sodium  100 mg Oral Daily   . fluticasone-salmeterol  1 puff Inhalation BID   . heparin (porcine)  5,000 Units Subcutaneous Q12H [facility]   . hydrALAZINE  100 mg Oral Q8H SCH   . isosorbide mononitrate  60 mg Oral Daily   . terazosin  5 mg Oral QHS   . venlafaxine  25 mg Oral BID       Intake and Output Summary (Last 24 hours) at Date Time  Intake/Output Summary (Last 24 hours) at 05/06/14 1517  Last data filed at 05/06/14 0800   Gross per 24 hour   Intake    110 ml   Output      0 ml   Net    110 ml           Labs:       Recent Labs  Lab 05/05/14  0318 05/04/14  0507 05/03/14  0205 05/02/14  0157   SODIUM 141 141 141 140   POTASSIUM 3.9 3.6 3.4* 3.6   CHLORIDE 106 105 106 103   CO2 24 25 25 28    BUN 14.0 17.0 24.0* 29.0*   CREATININE 1.4* 1.4* 1.6* 1.9*   CALCIUM 8.4* 8.1* 8.1* 8.2*   ALBUMIN 2.7* 2.5* 2.3* 2.6*   PROTEIN, TOTAL 6.7  --  5.5* 6.0   BILIRUBIN, TOTAL 0.4  --  0.4 0.4   ALKALINE PHOSPHATASE 97  --  83 98   ALT 15  --  14 17   AST (SGOT) 25  --  16 16   GLUCOSE 134* 87 87 98       Recent Labs  Lab 05/04/14  0507   WBC 4.32   HEMOGLOBIN 8.9*    HEMATOCRIT 29.0*   PLATELETS 209               Rads:     Radiology Results (24 Hour)     Procedure Component Value Units Date/Time    US Carotid Duplex Dopp Comp Bilateral [[id number]] Collected:  05/05/14 1859    Order Status:  Completed Updated:  05/05/14 1904    Narrative:      HISTORY:  Acute stroke with right hemiparesis and right facial droop.    TECHNIQUE:  Duplex evaluation of the cerebral vasculature is performed  from the clavicles to the angles of the mandible utilizing gray scale,  gated Doppler and Doppler techniques.  Measurement of carotid stenosis  is based on velocity parameters that correlate the residual internal  carotid artery diameter with [place] [employer] Symptomatic Carotid  Endarterectomy Trial (NASCET)-based stenosis levels.    INTERPRETATION:  Examination of both left and right carotid arteries  demonstrates minor carotid bifurcation plaque formation without evidence  of significant stenosis.  The atherosclerotic plaque within the distal  common carotid arteries extending into the proximal external and  internal carotid arteries is calcified in nature.  Peak systolic and  end-diastolic velocity measurements are essentially normal throughout  common, internal and external carotid arteries bilaterally.  No focal  elevation of velocity or turbulence is present to suggest significant  stenotic or occlusive disease.  By both morphologic and velocity  criteria, stenosis of the carotid bifurcations and internal carotid  arteries is less than 40%.    Bilateral antegrade vertebral artery flow is demonstrated.      Impression:         Minor plaque at the carotid bifurcations with stenosis of  40% or less in the internal carotid arteries, otherwise normal duplex  evaluation of the carotid and vertebral arteries.    Alphonzo Grieve, MD   05/05/2014 6:59 PM              Assessment:     CVA R HP aphasia  Renal failure  Hypertension  UTI       Plan:   Bradycardia resolved      CVA RHP   Aphasia  Carotid  neg MRI L MCA distribution  On plavix  75 mg      Hx CAD    Hx cardiomyopathy  Hx aortic stenosis     Acute on chronic crt stable    Encephalopathy  metablolic   resolved      DM SSI monitor glucose      R/O sepsis  Hypothermic resolved  Evaluated by ID  UC bld cx on invanz   cx proteus   Treatment completed  COPD duoneb    Hypertension   Stable   Signed by: Laveda Norman, MD  05/06/2014  3:17 PM

## 2014-05-06 NOTE — Plan of Care (Signed)
Problem: Day of Admission- Stroke  Goal: Neurovascular Status is Stable or Improving  Outcome: Progressing  The patient and care giver's learning abilities have been assessed. Today's individualized plan of care is to monitor neuro function, vitals, blood sugar and maintain safety which was discussed with the patient and care giver and agree to it. Patient and care giver demonstrates understanding of disease process, treatment plan, medications and consequences of noncompliance. All questions and concerns were addressed.       Pt is nonverbal, able to follow command, able to ambulate to the bathroom a little unsteady when walking.   Pt had HBP scheduled BP med given.

## 2014-05-06 NOTE — Progress Notes (Signed)
RENAL PROGRESS NOTE    Date Time: 05/06/2014 9:12 AM  Patient Name: [name]  Attending Physician: Laveda Norman, MD    Assessment:   Acute kidney injury, renal function has recovered.  Stage III chronic kidney disease, stable.  Hypophosphatemia and hypokalemia, replaced.  Metabolic acidosis, corrected  Hypertension, blood pressure is high again.  Anemia of chronic kidney disease, stable.  Acute stroke  Diabetes, well controlled.  Secondary hyperparathyroidism of kidney disease    Plan:   Watch oral fluid intake until tomorrow morning, if less than 1000 mL she will benefit from IV fluids  Continue all current medication  I will follow blood pressure now that she is on terazosin  Recheck renal panel tomorrow    Subjective:   Chart reviewed and events noted.  Patient is noncommunicative, unable to comprehend much either, awake and sitting on bedside, confused at times according to the sitter.  Good urine output.  Eating okay    Review of Systems:   Unable to do because of patient's current inability to communicate.    Meds:     . albuterol-ipratropium  3 mL Nebulization Q6H SCH   . amLODIPine  10 mg Oral Daily   . aspirin EC  81 mg Oral Daily   . atorvastatin  40 mg Oral QHS   . calcitRIOL  0.25 mcg Oral Daily   . cloNIDine  0.2 mg Oral BID   . darbepoetin alfa   40 mcg Subcutaneous Weekly   . docusate sodium  100 mg Oral Daily   . fluticasone-salmeterol  1 puff Inhalation BID   . heparin (porcine)  5,000 U Subcutaneous Q12H [facility]   . hydrALAZINE  100 mg Oral Q8H [facility]   . isosorbide mononitrate  60 mg Oral Daily   . terazosin  5 mg Oral QHS   . venlafaxine  25 mg Oral BID     Physical Exam:     BP 174/75 mmHg  Pulse 108  Temp(Src) 97.9 F (36.6 C) (Oral)  Resp 20  Ht 1.549 m (5\' 1" )  Wt 66.497 kg (146 lb 9.6 oz)  BMI 27.71 kg/m2  SpO2 95%    Intake     60 ml   Output      0 ml   Net     60 ml     Awake but unable to communicate.  HEENT shows no new findings.  Mucosa moist.  No JVD  Heart sounds are  unchanged, no new murmur  Lungs clear bilaterally  Abdomen is soft with normal bowel sounds  Moving all 4 limbs, detail neuro exam not done.  No pedal edema    Labs:        05/04/14   0507   WBC  4.32   HEMOGLOBIN  8.9*   HEMATOCRIT  29.0*   PLATELETS  209        05/05/14   0318  05/04/14   0507   SODIUM  141  141   POTASSIUM  3.9  3.6   CHLORIDE  106  105   CO2  24  25   BUN  14.0  17.0   CREATININE  1.4*  1.4*   GLUCOSE  134*  87   CALCIUM  8.4*  8.1*   MAGNESIUM   --   1.6   PHOSPHORUS   --   1.8*     AST (SGOT)  25   --    ALT  15   --  ALKALINE PHOSPHATASE  97   --    PROTEIN, TOTAL  6.7   --    ALBUMIN  2.7*  2.5*     Carotid Doppler noted.    Signed by: Nancy Nordmann, MD  [phone]

## 2014-05-06 NOTE — Progress Notes (Signed)
PROGRESS NOTE    Date Time: 05/06/2014 2:29 PM  Patient Name: [name], [name]      Subjective:   Remained aphasic, otherwise, awake and alert and able to follow simple commands.  Denies shortness of breath.  Denies difficulty with swallowing.    Medications:      Scheduled Meds: PRN Meds:        albuterol-ipratropium 3 mL Nebulization Q6H SCH   amLODIPine 10 mg Oral Daily   aspirin EC 81 mg Oral Daily   atorvastatin 40 mg Oral QHS   calcitRIOL 0.25 mcg Oral Daily   cloNIDine 0.2 mg Oral BID   darbepoetin alfa (ARANESP) injection 40 mcg Subcutaneous Weekly   docusate sodium 100 mg Oral Daily   fluticasone-salmeterol 1 puff Inhalation BID   heparin (porcine) 5,000 Units Subcutaneous Q12H SCH   hydrALAZINE 100 mg Oral Q8H SCH   isosorbide mononitrate 60 mg Oral Daily   terazosin 5 mg Oral QHS   venlafaxine 25 mg Oral BID         Continuous Infusions:      acetaminophen 650 mg Q4H PRN   Or     acetaminophen 650 mg Q4H PRN   acetaminophen 650 mg Q4H PRN   ALPRAZolam 0.25 mg Q6H PRN   atropine 0.5 mg PRN   bisacodyl 10 mg QD PRN   dextrose 25 mL PRN   dextrose 25 mL PRN   dextrose 25 mL PRN   fentaNYL 12.5 mcg Q4H PRN   glucagon (rDNA) 1 mg PRN   glucagon (rDNA) 1 mg PRN   glucagon (rDNA) 1 mg PRN   hydrALAZINE 10 mg Q6H PRN   insulin aspart 1-3 Units QHS PRN   insulin aspart 1-5 Units PRN   insulin aspart 1-5 Units TID AC PRN   naloxone 0.2 mg PRN   polyethylene glycol 17 g QD PRN   zolpidem 5 mg QHS PRN           Review of Systems:   A comprehensive review of systems was: General ROS:[x]    negative other than : Patient is aphasic  Respiratory ROS:[]  cough,[] shortness of breath,  []  wheezing  Cardiovascular ROS:  []  chest pain []  dyspnea on exertion  Gastrointestinal ROS: []  abdominal pain, [] change in bowel habits,  [] black/ bloody stools  Genito-Urinary ROS: []  dysuria,[]  trouble voiding,[]  hematuria  Musculoskeletal ROS:[]  negative  Neurological ROS:[]      Physical Exam:     Filed Vitals:    05/06/14 1418   BP:    Pulse:     Temp:    Resp:    SpO2: 98%       Intake and Output Summary (Last 24 hours) at Date Time    Intake/Output Summary (Last 24 hours) at 05/06/14 1429  Last data filed at 05/06/14 0800   Gross per 24 hour   Intake    110 ml   Output      0 ml   Net    110 ml       General appearance [x]  alert, [x]  following commands  [] no distress  Eyes -[]  pupils equal and[] reactive, -[]   extraocular eye movements intact  Mouth -[x]  mucous membranes moist, [] pharynx normal without lesions  Neck -[x]   supple,  [] no adenopathy, [x] no JVD,[] Thyromegaly.  Lymphatics -[]  no palpable lymphadenopathy  Chest - [x]  off O2 saturation 98 percent, []  wheezes, [] rales[x]  clear to auscultation, [x] symmetric air entry  Heart - [x] normal rate, [x] regular rhythm, [x] normal S1, S2, []  murmurs,[]  rubs, []   gallops  Abdomen - [x] soft,[x]  nontender,[x]  nondistended,[]  no masses[]  organomegaly  Neurological - [x]   alert,[x]  oriented x3,[x]  aphasic,[]  no focal findings or movement disorder noted  Musculoskeletal - []  joint tenderness,[]  deformity [] swelling  Extremities -[]  peripheral pulses normal,[]   pedal edema,[]  clubbing []  cyanosis  Skin - [] normal coloration and turgor, []  rashes,[]   skin lesions noted                Labs:     Results     Procedure Component Value Units Date/Time    Glucose Whole Blood - POCT [[id number]]  (Abnormal) Collected:  05/05/14 2101     POCT - Glucose Whole blood 148 (H) mg/dL Updated:  40/98/11 9147    Lipid panel [[id number]]  (Abnormal) Collected:  05/05/14 1457    Specimen Information:  Blood Updated:  05/05/14 1927     Cholesterol 185 mg/dL      Triglycerides 829 mg/dL      HDL 53 mg/dL      LDL Calculated 562 (H) mg/dL      VLDL Cholesterol Cal 25 mg/dL      CHOL/HDL Ratio 3.5     Hemolysis index [[id number]]  (Abnormal) Collected:  05/05/14 1457     Hemolysis Index 48 (H) Updated:  05/05/14 1927    Glucose Whole Blood - POCT [[id number]]  (Abnormal) Collected:  05/05/14 1557     POCT - Glucose Whole blood 157 (H) mg/dL  Updated:  69/62/95 2841          Rads:     Radiology Results (24 Hour)     Procedure Component Value Units Date/Time    US Carotid Duplex Dopp Comp Bilateral [[id number]] Collected:  05/05/14 1859    Order Status:  Completed Updated:  05/05/14 1904    Narrative:      HISTORY:  Acute stroke with right hemiparesis and right facial droop.    TECHNIQUE:  Duplex evaluation of the cerebral vasculature is performed  from the clavicles to the angles of the mandible utilizing gray scale,  gated Doppler and Doppler techniques.  Measurement of carotid stenosis  is based on velocity parameters that correlate the residual internal  carotid artery diameter with [place] [employer] Symptomatic Carotid  Endarterectomy Trial (NASCET)-based stenosis levels.    INTERPRETATION:  Examination of both left and right carotid arteries  demonstrates minor carotid bifurcation plaque formation without evidence  of significant stenosis.  The atherosclerotic plaque within the distal  common carotid arteries extending into the proximal external and  internal carotid arteries is calcified in nature.  Peak systolic and  end-diastolic velocity measurements are essentially normal throughout  common, internal and external carotid arteries bilaterally.  No focal  elevation of velocity or turbulence is present to suggest significant  stenotic or occlusive disease.  By both morphologic and velocity  criteria, stenosis of the carotid bifurcations and internal carotid  arteries is less than 40%.    Bilateral antegrade vertebral artery flow is demonstrated.      Impression:         Minor plaque at the carotid bifurcations with stenosis of  40% or less in the internal carotid arteries, otherwise normal duplex  evaluation of the carotid and vertebral arteries.    Alphonzo Grieve, MD   05/05/2014 6:59 PM              Assessment/Plan:     Respiratory insufficiency: Hypoxia, resolved, off O2 with good O2 saturation.  Observe for any sign or symptoms of aspiration  and  desaturation.    Severe bradyarrhythmias: Junctional rhythm, likely metabolic, resolved.    Encephalopathy: Acute, ?metabolic.  MRI with acute left CVA, seen by neurology.  Now patient is aphasic.    Accelerated hypertension: Fairly well controlled, On multiple medication.    Anemia: Seems to be chronic, stable hematocrit.    Renal insufficiency: Acute on chronic, much improved renal function.    We will follow as needed.  Please contact us if any question.    Signed by: Margarite Gouge, MD

## 2014-05-06 NOTE — Progress Notes (Signed)
INFECTIOUS DISEASES PROGRESS NOTE    Date Time: 05/06/2014 9:25 AM  Patient Name: [name], [name]  Patient status.Inpatient  Hospital Day: 8    Assessment and Plan:   1.  UTI  -Rx completed  2. No fever or leucocytosis off antibiotics      PLAN: 1. Will sign off  Subjective:   Non toxic      Review of Systems:   Review of Systems - General ROS: negative    Antibiotics:     Other medications reviewed in EPIC.  Central Access:       Physical Exam:   Afeb  Filed Vitals:    05/06/14 0752   BP:    Pulse:    Temp:    Resp:    SpO2: 95%       Chest - clear to auscultation, no wheezes, rales or rhonchi, symmetric air entry  Heart - normal rate, regular rhythm, normal S1, S2, no murmurs, rubs, clicks or gallops  Abdomen - soft, nontender, nondistended, no masses or organomegaly    Labs:     Microbiology Results     Procedure Component Value Units Date/Time    MRSA culture [[id number]] Collected:  04/29/14 0034    Specimen Information:  Body Fluid / Nares and Throat Updated:  04/29/14 0516    MRSA culture [[id number]] Collected:  04/29/14 0034    Specimen Information:  Body Fluid / Nares and Throat Updated:  04/29/14 0516    Urine culture [[id number]] Collected:  04/29/14 0354    Specimen Information:  Urine / Urine, Catheterized, Foley Updated:  04/29/14 0808          CBC w/Diff CMP     Recent Labs  Lab 05/04/14  0507 05/03/14  0205 05/02/14  0157   WBC 4.32 4.44 5.44   HEMOGLOBIN 8.9* 8.3* 8.8*   HEMATOCRIT 29.0* 27.3* 28.8*   PLATELETS 209 188 213   MCV 87.9 88.6 88.6   NEUTROPHILS  --  46 50       PT/INR     Recent Labs  Lab 05/05/14  0333   PT INR 1.0         Recent Labs  Lab 05/05/14  0318 05/04/14  0507 05/03/14  0205 05/02/14  0157   SODIUM 141 141 141 140   POTASSIUM 3.9 3.6 3.4* 3.6   CHLORIDE 106 105 106 103   CO2 24 25 25 28    BUN 14.0 17.0 24.0* 29.0*   CREATININE 1.4* 1.4* 1.6* 1.9*   GLUCOSE 134* 87 87 98   CALCIUM 8.4* 8.1* 8.1* 8.2*   MAGNESIUM  --  1.6 1.7 1.9   PHOSPHORUS  --  1.8* 2.0* 2.6   PROTEIN, TOTAL 6.7   --  5.5* 6.0   ALBUMIN 2.7* 2.5* 2.3* 2.6*   AST (SGOT) 25  --  16 16   ALT 15  --  14 17   ALKALINE PHOSPHATASE 97  --  83 98   BILIRUBIN, TOTAL 0.4  --  0.4 0.4      Glucose POCT     Recent Labs  Lab 05/05/14  0318 05/04/14  0507 05/03/14  0205 05/02/14  0157 04/30/14  0420   GLUCOSE 134* 87 87 98 74          Rads:     Radiology Results (24 Hour)     Procedure Component Value Units Date/Time    US Carotid Duplex Dopp Comp Bilateral [[id number]] Collected:  05/05/14 1859  Order Status:  Completed Updated:  05/05/14 1904    Narrative:      HISTORY:  Acute stroke with right hemiparesis and right facial droop.    TECHNIQUE:  Duplex evaluation of the cerebral vasculature is performed  from the clavicles to the angles of the mandible utilizing gray scale,  gated Doppler and Doppler techniques.  Measurement of carotid stenosis  is based on velocity parameters that correlate the residual internal  carotid artery diameter with [place] Symptomatic Carotid  Endarterectomy Trial (NASCET)-based stenosis levels.    INTERPRETATION:  Examination of both left and right carotid arteries  demonstrates minor carotid bifurcation plaque formation without evidence  of significant stenosis.  The atherosclerotic plaque within the distal  common carotid arteries extending into the proximal external and  internal carotid arteries is calcified in nature.  Peak systolic and  end-diastolic velocity measurements are essentially normal throughout  common, internal and external carotid arteries bilaterally.  No focal  elevation of velocity or turbulence is present to suggest significant  stenotic or occlusive disease.  By both morphologic and velocity  criteria, stenosis of the carotid bifurcations and internal carotid  arteries is less than 40%.    Bilateral antegrade vertebral artery flow is demonstrated.      Impression:         Minor plaque at the carotid bifurcations with stenosis of  40% or less in the internal carotid arteries,  otherwise normal duplex  evaluation of the carotid and vertebral arteries.    Alphonzo Grieve, MD   05/05/2014 6:59 PM              Signed by: Zachery Dauer

## 2014-05-06 NOTE — Plan of Care (Signed)
Problem: Day 2- Stroke  Goal: Neurovascular Status is Stable or Improving  Outcome: Progressing  The patient and care giver's learning abilities have been assessed. Today's individualized plan of care is to continue to monitor neuro function, blood sugar and blood pressure, start IV fluid and maintain contact and fall precautions which was discussed with the patient and care giver and agree to it. Patient and care giver demonstrates understanding of disease process, treatment plan, medications and consequences of noncompliance. All questions and concerns were addressed.       Pt is still non verbal but cooperative/able to follow command, denies pain, able to walk to the bathroom no distress. IV fluid started.

## 2014-05-07 LAB — GLUCOSE WHOLE BLOOD - POCT
Whole Blood Glucose POCT: 155 mg/dL — ABNORMAL HIGH (ref 70–100)
Whole Blood Glucose POCT: 207 mg/dL — ABNORMAL HIGH (ref 70–100)
Whole Blood Glucose POCT: 202 mg/dL — ABNORMAL HIGH (ref 70–100)
Whole Blood Glucose POCT: 90 mg/dL (ref 70–100)

## 2014-05-07 LAB — BASIC METABOLIC PANEL
Anion Gap: 11 (ref 5.0–15.0)
BUN: 7 mg/dL (ref 7.0–19.0)
CO2: 22 mEq/L (ref 22–29)
Calcium: 8.2 mg/dL — ABNORMAL LOW (ref 8.5–10.5)
Chloride: 110 mEq/L (ref 100–111)
Creatinine: 1.2 mg/dL — ABNORMAL HIGH (ref 0.6–1.0)
Glucose: 133 mg/dL — ABNORMAL HIGH (ref 70–100)
Potassium: 4.2 mEq/L (ref 3.5–5.1)
Sodium: 143 mEq/L (ref 136–145)

## 2014-05-07 LAB — HEMOLYSIS INDEX: Hemolysis Index: 11 (ref 0–18)

## 2014-05-07 LAB — GFR: EGFR: 54.5

## 2014-05-07 NOTE — PT Eval Note (Addendum)
[facility]  Physical Therapy Evaluation and Treatment    Patient: [name]     MRN#: [id number]   Unit: NEUROVASCULAR [place]  Bed: V4098/J1914-78    Time of Evaluation:  Time Calculation  PT Received On: 05/07/2014  Start Time: 1530  Stop Time: 1540  Time Calculation (min): 10    Time of Treatment:  Time Calculation  PT Received On: 05/07/2014  Start Time: 1541  Stop Time: 1600  Time Calculation (min): 19    Consult received for [name] for PT Evaluation and Treatment.  Patient's medical condition is appropriate for Physical therapy intervention at this time.    Activity Orders: Mobility assessment 2/18    Precautions and Contraindications:   Precautions  Weight Bearing Status: RLE WBAT  Sternal Precautions: other (comment) (Patient with sx 1/28-still following sternal precautions)  Restricted BP Precautions: other (comment) (pt reports no L/UE)  Other Precautions: contact isolation, recent junctional bradycardia, 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    Medical Diagnosis: Diabetes mellitus [E11.9]    History of Present Illness: [name] is a 64 y.o. female admitted on 04/28/2014 with bradycardia. Reevaluation completed 2/2 Pt with RRT call on 2/18 for stroke.     Patient Active Problem List   Diagnosis   . NSTEMI (non-ST elevated myocardial infarction)   . Hypertensive urgency   . PAD (peripheral artery disease)   . Arterial leg ulcer   . Chronic obstructive pulmonary disease, unspecified COPD type   . Ischemic cardiomyopathy   . CAD (coronary artery disease)   . Diabetes   . Chronic kidney disease   . Debilitated   . Complete heart block   . Junctional bradycardia   . Hyperkalemia   . Renal failure   . Bradycardia   . Acute on chronic renal failure   . Encephalopathy   . Azotemia   . Coronary artery disease involving native coronary artery, angina presence unspecified, unspecified whether native or transplanted heart        Past Medical/Surgical History:  Past Medical History   Diagnosis Date   .  Diabetes mellitus    . Chronic kidney disease    . Myocardial infarction    . Hypertension    . Hyperlipidemia    . Peripheral arterial disease      06/28/13 right superficial femoral artery stenosis, tibial peroneal trunk stenosis, left CVL a stenosis, treated with angiography and angioplasty.   . Coronary artery disease      Left circumflex artery presumed DES.,  Chronically occluded right coronary artery stent   . Anemia      She redid to chronic disease and iron deficiency.     . Arterial leg ulcer      2014, receiving home health   . Chronic obstructive pulmonary disease      Previously on Spiriva and Advair      Past Surgical History   Procedure Laterality Date   . Cardiac angiography and angioplasty  06/2013   . Colonoscopy  Unknown   . Tubal ligation     . Coronary artery bypass N/A 03/04/2014     Procedure: CORONARY ARTERY BYPASS;  Surgeon: Austin Miles, MD;  Location: [facility] HEART OR;  Service: Cardiovascular;  Laterality: N/A;  LIMA to LAD  SVG to OM   . Endoscopic,vein harvest Left 03/04/2014     Procedure: ENDOSCOPIC,VEIN HARVEST;  Surgeon: Austin Miles, MD;  Location: [facility] HEART OR;  Service: Cardiovascular;  Laterality: Left;  By D. Lazarus Gowda,  RNFA. Left groin to mid-calf.   Rhae Hammock N/A 03/04/2014     Procedure: TEE;  Surgeon: Austin Miles, MD;  Location: [facility] HEART OR;  Service: Cardiovascular;  Laterality: N/A;  probe [phone]         X-Rays/Tests/Labs:  Lab Results   Component Value Date/Time    HGB 8.9* 05/04/2014 05:07 AM    HEMATOCRIT 29.0* 05/04/2014 05:07 AM    POTASSIUM 4.2 05/07/2014 06:09 AM    SODIUM 143 05/07/2014 06:09 AM    PT INR 1.0 05/05/2014 03:33 AM    TROPONIN I 0.07 05/03/2014 02:05 AM    TROPONIN I 0.09 05/02/2014 01:57 AM    TROPONIN I 0.07 04/30/2014 05:48 PM    TROPONIN I 0.10* 04/30/2014 11:54 AM     MRI Brain     IMPRESSION:   Acute ischemia involving the left insular and left anterior temporal  lobe (left MCA distribution). No associated evidence of  acute  hemorrhage.  This critical result was reported to the inpatient nurse caring for the  patient, Nurse Shanda Bumps, by telephone on 05/05/2014 at 8:07 AM, who  acknowledged and repeated the result.  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 Nursing clears patient for therapy.   Patient Goal: aphasic   Pain Assessment  Pain Assessment: Numeric Scale (0-10)  Pain Score: 5-moderate pain  POSS Score: Awake and Alert  Pain Location: Knee;Foot  Pain Orientation: Right  Pain Frequency: Increases with movement    Objective:  Observation of Patient/Vital Signs: VSS    Patient received seated at edge of bed with Intravenous (IV) in place.      Inspection/Posture: sitting at EOB, food in mouth    Cognitive Status and Neuro Exam:  Cognition/Neuro Status  Arousal/Alertness: Appropriate responses to stimuli  Attention Span: Appears intact  Orientation Level: Oriented X4  Following Commands: Follows one step commands with repetition;Follows one step commands with increased time  Safety Awareness: minimal verbal instruction  Insights: Decreased awareness of deficits  Problem Solving: minimal assistance  Behavior: calm;cooperative  Motor Planning: apraxia  Coordination: FMC impaired  Neuro Status RETIRED  Behavior: calm;cooperative  Motor Planning: apraxia  Coordination: FMC  impaired    Musculoskeletal Examination  Gross ROM  Neck/Trunk ROM: within functional limits  Right Upper Extremity ROM: within functional limits  Left Upper Extremity ROM: within functional limits  Right Lower Extremity ROM: within functional limits  Left Lower Extremity ROM: within functional limits  Gross Strength  Neck/Trunk Strength: 4/5  Right Upper Extremity Strength: 3+/5  Left Upper Extremity Strength: 4/5  Right Lower Extremity Strength: 3+/5  Left Lower Extremity Strength: 4/5  Tone  Tone: within functional limits    Functional Mobility:  Functional Mobility  Sit to Stand: Minimal Assist  Stand to Sit: Contact Guard Assist  Transfers  Bed to Chair: Risk analyst  Chair to Bed: Research scientist (physical sciences)  Ambulation: Soil scientist (Feet): 15 Feet  Pattern: Wide BOS;decreased cadence;decreased step length (antalgic )     Balance  Balance  Balance: needs focused assessment  Sitting - Static: Good  Sitting - Dynamic: Good  Standing - Static: Fair  Standing - Dynamic: Fair    Participation and Activity Tolerance  Participation and Endurance  Participation Effort: good  Endurance: Tolerates 10 - 20 min exercise with multiple rests  [place] Dyspnea Scale: 0 Dyspnea    Educated the patient to role of physical therapy, plan of care, goals of therapy and safety with mobility and ADLs.   Patient left in bed with IV and sitter in place and call bell within reach. RN notified of session outcome.     Assessment: KRYSTAN NORTHROP is a 64 y.o. female admitted 04/28/2014 who presents with the following impairments: Assessment: Decreased LE strength;Decreased UE strength;Decreased safety/judgement during functional mobility;Decreased cognition;Decreased endurance/activity tolerance;Decreased balance;Gait impairment;Impaired motor control;Impaired coordination;Decreased functional mobility.    Pt would benefit from Physical Therapy to maximize functional mobility and safety.      Treatment: Pt received sitting at EOB. Pt noted with expressive aphasia. Pt follows commands although demonstrates mild apraxia with certain commands. Pt able to grin and smile when aggreeing to questioning. Pt noted with mild dysmetria with right UE/LE. Pt also noted with right LE weakness and pain. Pt noted with mild impulsiveness and decreased awareness of impairments. Pt ambulates with RW with fair control and mild antalgia. Pt returned to bed with sitter at bedside.       Plan: Treatment/Interventions: Exercise, Gait training, Functional transfer training, LE strengthening/ROM, Endurance training, Bed mobility, Patient/family training   PT Frequency: 3-4x/wk   Risks/Benefits/POC Discussed with Pt/Family: With patient     G codes: yes  Mobility, Current Status (Z6109): CK  Mobility, Goal Status (U0454): CJ       Goals:   Goals  Goal Formulation: With patient  Time for Goal Acheivement: By time of discharge  Goals: Select goal  Pt Will Go Supine To Sit: independent, Partly met  Pt Will Perform Sit to Stand: independent, Partly met  Pt Will Ambulate: 101-150 feet, with rolling walker, with supervision, Not met  Pt Will Go Up / Down Stairs: 1-2 stairs, with supervision, With rail, Not met        DME Recommended for Discharge:  (already has FWW)  Discharge Recommendation: SNF, Acute Rehab     Madelon Lips, PT, DPT   Physical Therapist  License # [id number]  [phone]

## 2014-05-07 NOTE — Plan of Care (Signed)
Problem: Physical Therapy  Goal: Patient condition is improving per Physical Therapy Treatment Plan  Outcome: Progressing  Discharge Recommendation: Home with supervision, Home with home health PT  DME Recommended for Discharge:  (already has FWW)    Is an Occupational Therapy Evaluation Indicated at this time? Yes, an OT evaluation is indicated at this time.    Treatment/Interventions: Exercise, Gait training, Functional transfer training, LE strengthening/ROM, Endurance training, Bed mobility, Patient/family training  PT Frequency: 3-4x/wk     Early/Progressive Mobility Protocol Level: Step 6: Ambulate in Room (with assistance)  (Please See Therapy Evaluation for device and assistance level needed)    Goals:   Goals  Goal Formulation: With patient  Time for Goal Acheivement: By time of discharge  Goals: Select goal  Pt Will Go Supine To Sit: independent, Partly met  Pt Will Perform Sit to Stand: independent, Partly met  Pt Will Ambulate: 101-150 feet, with rolling walker, with supervision, Not met  Pt Will Go Up / Down Stairs: 1-2 stairs, with supervision, With rail, Not met

## 2014-05-07 NOTE — Plan of Care (Signed)
Problem: Protective Precautions [in description add neutropenic precautions]  Goal: Free from nosocomial infections  Outcome: Progressing  Intervention: Place patient in private room  The patient and care giver's learning abilities have been assessed. Today's individualized plan of care to .mainatin pt in a private room and follow Isolation protocoled with the patient and care giver and agree to it. Patient and care giver demonstrates understanding of disease process, treatment plan, medications and consequences of noncompliance. All questions and concerns were addressed.         Intervention: Wear gown for direct or indirect contact or contaminants  The patient and care giver's learning abilities have been assessed. Today's individualized plan of care to cont to wear personal protective equipment as per protocolscussed with the patient and care giver and agree to it. Patient and care giver demonstrates understanding of disease process, treatment plan, medications and consequences of noncompliance. All questions and concerns were addressed.         Intervention: Instruct patient/patient care companion regarding infection precaution  The patient and care giver's learning abilities have been assessed. Today's individualized plan of care to cont to.review impt. Of using personal protective equipment he patient and care giver and agree to it. Patient and care giver demonstrates understanding of disease process, treatment plan, medications and consequences of noncompliance. All questions and concerns were addressed.

## 2014-05-07 NOTE — Plan of Care (Signed)
Problem: Moderate/High Fall Risk Score >/=15  Goal: Patient will remain free of falls  Outcome: Progressing  The patient and care giver's learning abilities have been assessed. Today's individualized plan of care to maintain bed alarm on at all times, Review with pt,fall and safety, maintain.chair alarms when OOB, maintain sitter at bedside and collaborate with care team , keep pathway free from clutter e giver demonstrates understanding of disease process, treatment plan, medications and consequences of noncompliance. All questions and concerns were addressed.           Problem: Day 2- Stroke  Goal: Neurovascular Status is Stable or Improving  Outcome: Progressing  The patient and care giver's learning abilities have been assessed. Today's individualized plan of care to .will cont to monitor pt neuro status every 4 hrs, and notify MD for any acute change..was discussed with the patient and care giver and agree to it. Patient and care giver demonstrates understanding of disease process, treatment plan, medications and consequences of noncompliance. All questions and concerns were addressed.         Intervention: Encourage ambulation/activity per patient's ability.  The patient and care giver's learning abilities have been assessed. Today's individualized plan of care to increase pt activity and assist pt to chair and assess pt tolerance of activity ................Marland Kitchenwas discussed with the patient and care giver and agree to it. Patient and care giver demonstrates understanding of disease process, treatment plan, medications and consequences of noncompliance. All questions and concerns were addressed.

## 2014-05-07 NOTE — SLP Progress Note (Signed)
Speech Language Pathology  [facility]  Speech Therapy Treatment Note    Patient:   [name]                                                        MRN#: [id number]  Date: 05/07/2014   # of Visits:    Time of treatment:   Start Time: 1320                      Stop Time: 1335      Subjective: Patient was seen at bedside with lunch.  Patient is on a puree diet with honey thick liquids.  Patient is unable to communicate.          Objective: diet tolerance monitoring, skilled meal assessment, therapeutic feeding      Assessment: Patient was seen with puree carrots, meat and potatoes.  Patient was seated at bedside and able to feed himself.  Oral phase remarkable for retention of bolus in right cavity with max verbal and tactile cues to use lingual movements to clear.  Lingual sweep was ineffective in removing material.  Liquid assistance was needed to clear material.  Patient with some difficulty drinking liquids from a straw but was able to take in liquids in a safe manner.          Plan/Recommendations: Continue with honey thick liquids and puree diet.          Charges Minutes   SLP treat    Swallow treat 15       Karleen Hampshire M.S., CCC-SLP

## 2014-05-07 NOTE — Progress Notes (Signed)
PROGRESS NOTE    Date Time: 05/07/2014 8:24 PM  Patient Name: Madison,[name]      Subjective:   Bun 24  crt 1.4Wbc 5.9  hct 29    Temp 98  SBP 162    MRI [name] MCA stroke    Carotid neg    Alert RUE weakness improved    Expressive aphasia    Review of Systems:   A comprehensive review of systems was: General ROS: negative for - chills, fever or night sweats  Psychological ROS: negative for - anxiety, depression, disorientation, hallucinations or suicidal ideation  ENT ROS: negative for - headaches, nasal congestion, sinus pain or visual changes  Allergy and Immunology ROS: negative for - itchy/watery eyes, nasal congestion or postnasal drip  Hematological and Lymphatic ROS: negative for - bleeding problems, bruising, pallor or weight loss  Endocrine ROS: negative for - malaise/lethargy or polydipsia/polyuria  Respiratory ROS: negative for - cough, orthopnea, shortness of breath or wheezing  Cardiovascular ROS: negative for - chest pain, dyspnea on exertion, orthopnea or shortness of breath  Gastrointestinal ROS: negative for - abdominal pain, constipation, diarrhea or nausea/vomiting  Genito-Urinary ROS: negative for - change in urinary stream, dysuria, hematuria or nocturia  Musculoskeletal ROS: negative for - joint pain, joint stiffness or joint swelling  Neurological ROS: negative for - confusion, dizziness, headaches, memory loss or speech problems  Dermatological ROS: negative for pruritus, rash and skin lesion changes    Physical Exam:     Filed Vitals:    05/07/14 1938   BP: 146/65   Pulse: 86   Temp: 97.7 F (36.5 C)   Resp: 16   SpO2: 94%       General appearance -awake  Mental status -awake expressive aphasia   Eyes - pupils equal and reactive, extraocular eye movements intact  Nose - normal and patent, no erythema, discharge or polyps  Mouth - mucous membranes moist, pharynx normal without lesions  Neck - supple, no significant adenopathy  Lymphatics - no palpable lymphadenopathy, no hepatosplenomegaly  Chest  - clear to auscultation, no wheezes, rales or rhonchi, symmetric air entry  Heart - normal rate, regular rhythm, normal S1, S2, no murmurs, rubs, clicks or gallops  Abdomen - soft, nontender, nondistended, no masses or organomegaly  Neurological - awake expressive aphasia RUE weakness  Musculoskeletal - no joint tenderness, deformity or swelling  Extremities - peripheral pulses normal, no pedal edema, no clubbing or cyanosis  Skin - normal coloration and turgor, no rashes, no suspicious skin lesions noted    Medications:     Current Facility-Administered Medications   Medication Dose Route Frequency   . albuterol-ipratropium  3 mL Nebulization Q6H [facility]   . amLODIPine  10 mg Oral Daily   . atorvastatin  40 mg Oral QHS   . calcitRIOL  0.25 mcg Oral Daily   . cloNIDine  0.2 mg Oral BID   . clopidogrel  75 mg Oral Daily   . darbepoetin alfa (ARANESP) injection  40 mcg Subcutaneous Weekly   . docusate sodium  100 mg Oral Daily   . fluticasone-salmeterol  1 puff Inhalation BID   . heparin (porcine)  5,000 Units Subcutaneous Q12H [facility]   . hydrALAZINE  100 mg Oral Q8H SCH   . isosorbide mononitrate  60 mg Oral Daily   . terazosin  5 mg Oral QHS   . venlafaxine  25 mg Oral BID       Intake and Output Summary (Last 24 hours) at  Date Time    Intake/Output Summary (Last 24 hours) at 05/07/14 2024  Last data filed at 05/07/14 1800   Gross per 24 hour   Intake   1050 ml   Output      0 ml   Net   1050 ml           Labs:       Recent Labs  Lab 05/07/14  0609 05/05/14  0318 05/04/14  0507 05/03/14  0205 05/02/14  0157   SODIUM 143 141 141 141 140   POTASSIUM 4.2 3.9 3.6 3.4* 3.6   CHLORIDE 110 106 105 106 103   CO2 22 24 25 25 28    BUN 7.0 14.0 17.0 24.0* 29.0*   CREATININE 1.2* 1.4* 1.4* 1.6* 1.9*   CALCIUM 8.2* 8.4* 8.1* 8.1* 8.2*   ALBUMIN  --  2.7* 2.5* 2.3* 2.6*   PROTEIN, TOTAL  --  6.7  --  5.5* 6.0   BILIRUBIN, TOTAL  --  0.4  --  0.4 0.4   ALKALINE PHOSPHATASE  --  97  --  83 98   ALT  --  15  --  14 17   AST (SGOT)  --  25   --  16 16   GLUCOSE 133* 134* 87 87 98       Recent Labs  Lab 05/04/14  0507   WBC 4.32   HEMOGLOBIN 8.9*   HEMATOCRIT 29.0*   PLATELETS 209               Rads:     Radiology Results (24 Hour)     ** No results found for the last 24 hours. **            Assessment:   CVA R HP aphasia  Renal failure  Hypertension  UTI       Plan:   Bradycardia resolved, avoid BB      CVA RHP   Aphasia  Carotid neg MRI [name] MCA distribution  On plavix 75 mg      Hx CAD    Hx cardiomyopathy  Hx aortic stenosis     Acute on chronic crt stable, continue with IV hydration and liberal fluid intake     Encephalopathy  metablolic   resolved      DM SSI monitor glucose      R/O sepsis  Hypothermic resolved  Evaluated by ID  UC bld cx on invanz   cx proteus   Treatment completed  COPD duoneb    Hypertension   Stable     PT/OT evaluation and treatment     Signed by: Mosie Epstein, MD  05/07/2014  8:24 PM

## 2014-05-08 LAB — BASIC METABOLIC PANEL
Anion Gap: 10 (ref 5.0–15.0)
BUN: 7 mg/dL (ref 7.0–19.0)
CO2: 23 mEq/L (ref 22–29)
Calcium: 8.3 mg/dL — ABNORMAL LOW (ref 8.5–10.5)
Chloride: 107 mEq/L (ref 100–111)
Creatinine: 1.5 mg/dL — ABNORMAL HIGH (ref 0.6–1.0)
Glucose: 184 mg/dL — ABNORMAL HIGH (ref 70–100)
Potassium: 3.6 mEq/L (ref 3.5–5.1)
Sodium: 140 mEq/L (ref 136–145)

## 2014-05-08 LAB — GLUCOSE WHOLE BLOOD - POCT
Whole Blood Glucose POCT: 145 mg/dL — ABNORMAL HIGH (ref 70–100)
Whole Blood Glucose POCT: 184 mg/dL — ABNORMAL HIGH (ref 70–100)
Whole Blood Glucose POCT: 196 mg/dL — ABNORMAL HIGH (ref 70–100)
Whole Blood Glucose POCT: 153 mg/dL — ABNORMAL HIGH (ref 70–100)

## 2014-05-08 LAB — GFR: EGFR: 42.2

## 2014-05-08 LAB — HEMOLYSIS INDEX: Hemolysis Index: 7 (ref 0–18)

## 2014-05-08 NOTE — Progress Notes (Signed)
Pt nonverbal, nods appropriately, but still confused. Only oriented to name, birth date and month. Slept most of the day. Sitter at bedside. Patient following commands and smiling when speaking to her. R-sided droop noted in face and r leg weaker than left. Wound care done. Sitter at bedside. Will continue to monitor. Safe environment provided. Safety maintained.

## 2014-05-08 NOTE — Plan of Care (Addendum)
Problem: Day 4- Stroke  Goal: Neurovascular Status is Stable or Improving  Outcome: Progressing  Pt was introduced to incoming nurse, Today's individualized plan of care for neuro checks, neb tx, wound care, and I & O's, was verbalized with the patient during bedside report, pt unable to verbalize agreement. Patient does not demonstrate understanding of disease process, treatment plan, medications and consequences of noncompliance.  Will continue to monitor. Intentional hourly rounding continued. Sitter at bedside. Safe environment provided.

## 2014-05-08 NOTE — Progress Notes (Signed)
PROGRESS NOTE    Date Time: 05/08/2014 8:47 PM  Patient Name: [name],[name]      Subjective:   Bun 24  crt 1.4Wbc 5.9  hct 29    Temp 98  SBP 162    MRI [name] MCA stroke    Carotid neg    Alert RUE weakness improved    Expressive aphasia    Review of Systems:   A comprehensive review of systems was: General ROS: negative for - chills, fever or night sweats  Psychological ROS: negative for - anxiety, depression, disorientation, hallucinations or suicidal ideation  ENT ROS: negative for - headaches, nasal congestion, sinus pain or visual changes  Allergy and Immunology ROS: negative for - itchy/watery eyes, nasal congestion or postnasal drip  Hematological and Lymphatic ROS: negative for - bleeding problems, bruising, pallor or weight loss  Endocrine ROS: negative for - malaise/lethargy or polydipsia/polyuria  Respiratory ROS: negative for - cough, orthopnea, shortness of breath or wheezing  Cardiovascular ROS: negative for - chest pain, dyspnea on exertion, orthopnea or shortness of breath  Gastrointestinal ROS: negative for - abdominal pain, constipation, diarrhea or nausea/vomiting  Genito-Urinary ROS: negative for - change in urinary stream, dysuria, hematuria or nocturia  Musculoskeletal ROS: negative for - joint pain, joint stiffness or joint swelling  Neurological ROS: negative for - confusion, dizziness, headaches, memory loss or speech problems  Dermatological ROS: negative for pruritus, rash and skin lesion changes    Physical Exam:     Filed Vitals:    05/08/14 2000   BP: 156/65   Pulse:    Temp: 98.1 F (36.7 C)   Resp: 18   SpO2: 98%       General appearance -awake  Mental status -awake expressive aphasia   Eyes - pupils equal and reactive, extraocular eye movements intact  Nose - normal and patent, no erythema, discharge or polyps  Mouth - mucous membranes moist, pharynx normal without lesions  Neck - supple, no significant adenopathy  Lymphatics - no palpable lymphadenopathy, no hepatosplenomegaly  Chest -  clear to auscultation, no wheezes, rales or rhonchi, symmetric air entry  Heart - normal rate, regular rhythm, normal S1, S2, no murmurs, rubs, clicks or gallops  Abdomen - soft, nontender, nondistended, no masses or organomegaly  Neurological - awake expressive aphasia RUE weakness  Musculoskeletal - no joint tenderness, deformity or swelling  Extremities - peripheral pulses normal, no pedal edema, no clubbing or cyanosis  Skin - normal coloration and turgor, no rashes, no suspicious skin lesions noted    Medications:     Current Facility-Administered Medications   Medication Dose Route Frequency   . albuterol-ipratropium  3 mL Nebulization Q6H [facility]   . amLODIPine  10 mg Oral Daily   . atorvastatin  40 mg Oral QHS   . calcitRIOL  0.25 mcg Oral Daily   . cloNIDine  0.2 mg Oral BID   . clopidogrel  75 mg Oral Daily   . darbepoetin alfa (ARANESP) injection  40 mcg Subcutaneous Weekly   . docusate sodium  100 mg Oral Daily   . fluticasone-salmeterol  1 puff Inhalation BID   . heparin (porcine)  5,000 Units Subcutaneous Q12H [facility]   . hydrALAZINE  100 mg Oral Q8H SCH   . isosorbide mononitrate  60 mg Oral Daily   . terazosin  5 mg Oral QHS   . venlafaxine  25 mg Oral BID       Intake and Output Summary (Last 24 hours) at  Date Time    Intake/Output Summary (Last 24 hours) at 05/08/14 2047  Last data filed at 05/08/14 1000   Gross per 24 hour   Intake    118 ml   Output      0 ml   Net    118 ml           Labs:       Recent Labs  Lab 05/08/14  0312 05/07/14  0609 05/05/14  0318 05/04/14  0507 05/03/14  0205 05/02/14  0157   SODIUM 140 143 141 141 141 140   POTASSIUM 3.6 4.2 3.9 3.6 3.4* 3.6   CHLORIDE 107 110 106 105 106 103   CO2 23 22 24 25 25 28    BUN 7.0 7.0 14.0 17.0 24.0* 29.0*   CREATININE 1.5* 1.2* 1.4* 1.4* 1.6* 1.9*   CALCIUM 8.3* 8.2* 8.4* 8.1* 8.1* 8.2*   ALBUMIN  --   --  2.7* 2.5* 2.3* 2.6*   PROTEIN, TOTAL  --   --  6.7  --  5.5* 6.0   BILIRUBIN, TOTAL  --   --  0.4  --  0.4 0.4   ALKALINE PHOSPHATASE  --   --   97  --  83 98   ALT  --   --  15  --  14 17   AST (SGOT)  --   --  25  --  16 16   GLUCOSE 184* 133* 134* 87 87 98       Recent Labs  Lab 05/04/14  0507   WBC 4.32   HEMOGLOBIN 8.9*   HEMATOCRIT 29.0*   PLATELETS 209               Rads:     Radiology Results (24 Hour)     ** No results found for the last 24 hours. **            Assessment:   CVA R HP aphasia  Renal failure  Hypertension  UTI       Plan:   Bradycardia resolved, avoid BB, clinically stable,      Patient holding food in her mouth with high risk of aspiration, Speech for swallow evaluation     CVA RHP   Aphasia  Carotid neg MRI [name] MCA distribution  On plavix 75 mg      Hx CAD    Hx cardiomyopathy  Hx aortic stenosis     Acute on chronic crt stable, continue with IV hydration and liberal fluid intake     Encephalopathy  metablolic   resolved      DM SSI monitor glucose      R/O sepsis  Hypothermic resolved  Evaluated by ID  UC bld cx on invanz   cx proteus   Treatment completed  COPD duoneb    Hypertension   Stable     PT/OT evaluation and treatment     Signed by: Mosie Epstein, MD  05/08/2014  8:47 PM

## 2014-05-08 NOTE — Plan of Care (Signed)
Problem: Neurological Deficit  Goal: Neurological status is stable or improving  Intervention: Maintain safe environment  The patient and care giver's learning abilities have been assessed. Today's individualized plan of care is to monitor neuro status .also monitor safety, has 1;1 sitter, pt/ot eval and treatment was discussed with the patient and care giver and agree to it. Patient and care giver demonstrates understanding of disease process, treatment plan, medications and consequences of noncompliance. All questions and concerns were addressed.

## 2014-05-09 LAB — BASIC METABOLIC PANEL
Anion Gap: 7 (ref 5.0–15.0)
BUN: 6 mg/dL — ABNORMAL LOW (ref 7.0–19.0)
CO2: 23 mEq/L (ref 22–29)
Calcium: 8 mg/dL — ABNORMAL LOW (ref 8.5–10.5)
Chloride: 109 mEq/L (ref 100–111)
Creatinine: 1.4 mg/dL — ABNORMAL HIGH (ref 0.6–1.0)
Glucose: 141 mg/dL — ABNORMAL HIGH (ref 70–100)
Potassium: 3.7 mEq/L (ref 3.5–5.1)
Sodium: 139 mEq/L (ref 136–145)

## 2014-05-09 LAB — GLUCOSE WHOLE BLOOD - POCT
Whole Blood Glucose POCT: 146 mg/dL — ABNORMAL HIGH (ref 70–100)
Whole Blood Glucose POCT: 185 mg/dL — ABNORMAL HIGH (ref 70–100)
Whole Blood Glucose POCT: 193 mg/dL — ABNORMAL HIGH (ref 70–100)
Whole Blood Glucose POCT: 141 mg/dL — ABNORMAL HIGH (ref 70–100)

## 2014-05-09 LAB — HEMOLYSIS INDEX: Hemolysis Index: 6 (ref 0–18)

## 2014-05-09 LAB — GFR: EGFR: 45.7

## 2014-05-09 NOTE — Progress Notes (Signed)
Nutritional Support Services  Nutrition Assessment    [name] 64 y.o. female   MRN: [id number]    Summary of Nutrition Recommendations:  1. Continue current diet with modified consistency per SLP recommendations     2. Encourage PO intake with meals     3. Ensure Pudding TID     -----------------------------------------------------------------------------------------------------------------                                                      Assessment Data:     Nutrition: f/u to RD assessment from 2/17 for wounds. SLP working with pt at time on assessment. Pt previously declined nutrition supplements from previous RD. Variable PO intake documented, however consistently < 50% of meals.     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     PSH: has past surgical history that includes Cardiac angiography and angioplasty (06/2013); Colonoscopy (Unknown); Tubal ligation; CORONARY ARTERY BYPASS (N/A, 03/04/2014); ENDOSCOPIC,VEIN HARVEST (Left, 03/04/2014); and TEE (N/A, 03/04/2014).     Current Diet Order  Diet dysphagia PUREED Liquid consistency:: Honey Thick; Additional restrictions:: Consistent carbohydrate, Renal  Intake:    25% of meal @ 1000 (2/21)    10% of meal @ 1800 (2/20)    50% of meal @ 1300 (2/20)    20% of meal @ 0900 (2/20)    30% of meal @ 1800 (2/19)    20% of meal @ 1200 (2/19)     10% of meal @ 0800 (2/19)     ANTHROPOMETRIC  Anthropometrics  Height: 154.9 cm (5\' 1" )  Weight: 66.679 kg (147 lb)  Weight Change: 0  IBW/kg (Calculated) Female: 50.91 kg  IBW/kg (Calculated) Female: 47.73 kg  BMI (calculated): 32.7    Wt Readings from Last 10 Encounters:   05/07/14 66.679 kg (147 lb)   04/22/14 79.9 kg (176 lb 2.4 oz)     Physical Assessment:   Head: missing teeth   Upper Body:   Lower Body:   Edema: +1 mild pitting edema to RLE   Skin: R lateral malleolus ulcer   GI function: WDL (2/22)     ESTIMATED NEEDS  Estimated Energy Needs  Total Energy Estimated Needs: 1523-1650 kcal/day   Method for Estimating Needs: Huey Romans with AF 1.2 -1.3    Estimated Protein Needs  Total Protein Estimated Needs: 94-117 gm pro/day   Method for Estimating Needs: 1.2-1.5 gm pro/kg using wt 78.3 kg     Fluid Needs  Total Fluid Estimated Needs: 1500 ml/day   Method for Estimating Needs: 1 ml/kcal or per MD     Pertinent Medications: coalce   IVF:    . dextrose 5 % and 0.45% NaCl 50 mL/hr at 05/08/14 1749   IV dextrose provides ~204 kcals per day  Pertinent labs:  BUN/Creat elevated   BNP elevated (2702)      Learning Needs:  None at this time     Nutrition Assessment Summary: PO intake consistently <50% of meals. Spoke with SLP and RN- state that pt is not eating well , SLP unable to [employer] today. Pt is refusing nutrition supplements. Will try Ensure Pudding and f/u to assess acceptance                                                               Nutrition Diagnosis      Inadequate oral intake related to poor appetite as evidenced by meal intake <50% of lunch.                                                              Intervention     Nutrition recommendation -   1. Continue current diet with modified consistency per SLP recommendations     2. Encourage PO intake with meals     3. Ensure Pudding TID     Goal-   1. Tolerate diet with intake of 75% or greater within 4-7 days                                                               Monitoring     PO intake, labs, POC                                                         Evaluation   Goal 1: not met / ongoing goal     Nutrition Risk Level: high       Darci Current, RDN   Spectralink x [phone]  Nutrition Office x [phone]

## 2014-05-09 NOTE — Progress Notes (Signed)
[facility] Case Management Assessment   (for Readmissions and Non-Readmissions)  (Unit SW/CMs to complete top and Sections A to D.  ED SW/CM to complete top and Sections A, E, F.)    Please check one (X):  Pt is 30 day INPATIENT readmission X Pt is NOT a readmission    Pt is a Medicare Focus Dx  Pt is NOT a Medicare Focus Dx x     Summary and Discharge Plan (identify potential needs and discharge barriers):  [name] is a 64 yo female re-admitted from [place] with confirmed stroke with residual expressive aphasia an RUE weakness. Interview completed with dtr, [name]. Patient lives in a one story town home in [place] with her two daughters. Prior to recent illness, [name] was independent with her ADL's; however, she did need assistance with transportation which was provided by her family. DCP is to return to [place] to complete her rehabilitation.             Signed by:    Mina Marble, MSN, RN  Case Management Supervisor  Ext. [phone]             A. PSYCHOSOCIAL / DEMOGRAPHIC STATUS (DONE BY UNIT DCP & ED SW/CM STAFF)  Name(s) of person(s) being interviewed.  If not patient, indicate why.  [name], dtr. Patient has aphasia secondary to stroke   Orientation and decision-making abilities of patient (AOx3 and able to make decisions, vented and sedated, etc)  Patient unable to make her own decisions at this time.    Does the patient have an Advance Directive?  (Y/N)  Location?  (Home/Scanned into EPIC? If home, advised to bring in copy?) <no information>]  Advance Directive: Patient does not have advance directive] Discussed with dtr, [name], who would like to receive additional information and the form to complete. Currently [name] has medical and financial POA   Healthcare Decision Maker (HDM), if other than the patient?  (Include relationship and contact information & update face sheet)  [name], dtr   Any additional emergency contacts?   Extended Emergency Contact  Information  Primary Emergency Contact: [name] [place]  Home Phone: [phone]  Mobile Phone: [phone]  Relation: Daughter  Secondary Emergency Contact: [name] [place]  Mobile Phone: [phone]  Relation: None   Do you want to designate an individual who will care for or assist you upon discharge?  (Y/N)    If yes: Please list the name, relationship, phone number, and address of the designated individual. Name:  Relationship:  Phone:  Address:   Pt lives with... ?   Living Arrangements: Children]   Type of residence where patient lives Conservation officer, historic buildings, apartment, homeless shelter, ALF, etc.)?   Type of Home: Apartment]  Home Layout: One level]  One-level townhouse   Prior level of functioning (ambulation & ADL's)?   Prior level of function: Ambulates with assistive device, Independent with ADLs]   Support System [employer], friends, extended family, etc.)?  Family   Correct insurance listed on face sheet (verified with patient/HDM)? (Y/N) Yes   For Medicare/Medicare HMO patients only: Was an initial IMM signed within 24 hours of admission?  (Go to Chart Review tab > Media tab) Yes       B. CURRENT DISCHARGE PLANNING SERVICES (DONE BY UNIT DCP STAFF ONLY)  Name of Primary Care Physician verified in patient banner (update in patient banner if not listed) Karl Ito, MD  [phone]   What DME does the patient currently own? (rolling walker, hospital bed, home O2, BiPAP/CPAP, bedside commode, cane, hoyer lift) Assistive Device: None]  DME Currently at Home: Front wheel walker]   ] Dan Humphreys, shower chair   Are PT/OT services indicated? If so, has it been ordered?   Yes   Has the patient been to an Acute Rehab or SNF in the past?  If so, where?    [facility]   Does the patient currently have home health or hospice/palliative services in place?  If so, list agency name.    No, but has had [facility] in the past   Does the patient already have  community dialysis set up?  If so, where?  Yes       C. ANTICIPATED DISCHARGE PLAN (DONE BY UNIT DCP STAFF ONLY)  Anticipated Disposition: Option A      Return to [facility]   Anticipated Disposition: Option B         Patient/family aware and in agreement with discharge plan(s)? Yes   Who will transport the patient upon discharge?    Anticipate ambulance services   If applicable, were SNF or Hospice choices provided?    No   Palliative Care Consult needed? (if yes, contact attending MD) ([facility], [facility], [facility], [facility]) Consider Palliative Care Consult?: NO (04/28/14 1500)  Does Not Meet any above Criteria: None of above criteria (04/28/14 1500)  ([facility] ONLY) Consider Palliative Care Consult?: NO (04/28/14 1500)       D. READMISSION ASSESSMENT (DONE BY UNIT DCP STAFF ONLY, IF READMIT)  What is the current LACE score?  11   Is this patient an inpatient to inpatient 30 day readmission?  Yes   Previous admission discharge diagnosis? Complete heart block   Was patient readmitted from a facility?  If so, which one?   [facility]      Patient active with Home Health?  [facility]      Patient active with Home Hospice?    N/A   Contributing factors to readmission (i.e., no follow up appt on previous d/c, unable to get meds, no insurance, no social support, etc.)    Did patient/family understand what medication was for and how to administer, symptoms to indicate worsening condition, activity and diet restrictions at time of previous d/c?  Disease progression      Previous admission discharge diagnosis?  Complete heart block   If readmission, what was previous discharge plan? [facility]   What contributed to readmission? (No follow up appt on previous [place], unable to get meds, no insurance, no social support, etc.)  See above   Was there understanding at time of previous discharge of:  1. Reason for medications,  2. Symptoms that indicate worsening conditions,  3. Activity/Diet restrictions? Yes   IN  EPIC:  CM Assessment complete button selected in CM Navigator? Yes/No Y   IN EPIC:  Readmissions flowsheet completed in CM Navigator if patient is readmission? Yes/No  Y       E. ED READMISSION SCREENING (DONE BY ED SW/CM ONLY)  What is patient's LACE score?    Current ED diagnosis   Reason for Admission     Chronic obstructive pulmonary disease, unspecified COPD type    -  Primary J44.9     Sick     R69     Coronary artery disease involving native coronary artery, angina presence unspecified,  unspecified whether native or transplanted heart     I25.10     Bradycardia     R00.1     Acute on chronic renal failure     N17.9, N18.9     Encephalopathy     G93.40     Coronary artery disease involving native coronary artery without angina pectoris, unspecified whether native or transplanted heart     I25.10     Renal failure     N19          Previous admission discharge diagnosis     Previous admission date and facility 04/22/14  [facility]   Is this ED visit related to previous admission?     Contributing factors to readmission (i.e., no follow up appt on previous d/c, unable to get meds, no insurance, no social support, etc.)    Did patient/family understand what medication was for and how to administer, symptoms to indicate worsening condition, activity and diet restrictions at time of previous d/c?        Was there understanding at time of previous discharge of:  1. Reason for medications,  2. Symptoms that indicate worsening conditions,  3. Activity/Diet restrictions?    Was patient readmitted from a facility?  If so, which one?        Patient active with Home Health?        Patient active with Home Hospice?       What DME does the patient currently own? (rolling walker, hospital bed, home O2, BiPAP/CPAP, bedside commode, cane, hoyer lift) Assistive Device: None]  DME Currently at Home: Front wheel walker]   ]   Does the patient currently have home health or hospice/palliative services in place?  If so, list  agency name.      Does the patient already have community dialysis set up?  If so, where?    Name of attending/ED MD notified of potential readmission, method and time     Can Case Management interventions be implemented to avoid readmission?  (Y/N)     Patient/family aware and in agreement with intervention(s)?     ED Disposition (admit or discharge)     Admitting diagnosis    Admitting patient class    IN EPIC:  CM Assessment complete button selected in CM Navigator? Yes/No    IN EPIC:  Readmissions flowsheet completed in CM Navigator if patient is readmission? Yes/No           F. CM INTERVENTIONS DONE TO PREVENT READMISSION (DONE BY ED SW/CM ONLY)   X Intervention(s):  Select all that apply Facility / Therapist, art         Palliative/Hospice Care Services         Facility (LTC, LTACH, SNF, Acute Rehab, ALF)         APPOINTMENT:  Discharge Clinic follow up         APPOINTMENT:  PCP follow-up       APPOINTMENT:  IMG follow-up        Any other Services (TCM, CSB, Resources)         Template created and updated by [employer], CM Supervisor (11/15/13)

## 2014-05-09 NOTE — SLP Progress Note (Signed)
[facility]  Speech Therapy Treatment Note    Patient:   [name]                                                        MRN#: [id number]  Date: 05/09/2014   # of Visits:    Time of treatment:   Start Time:   1300                    Stop Time:1310      Subjective: pt alert / verbal , communicating via comm board with sitter        Objective:  Diet tolerance monitoring , skilled meal time assessment / diagnostic tx for swallow function, therapeutic feedings by SLP  and safe swallowing precautions education.          Assessment: limited PO intake with pureed texture this PM. Pt ingested less than 25%of meal ,transient coughing episodes noted with bolus with volume greater than 5 ml. Pt tolerated nectar thickened liquid via cup and spoon without overt s/s aspiration or penetration.        Plan/Recommendations: continue with pureed texture / nectar thickened liquids/ max aspiration precautions and assistance with meals. Pt will benefit from speech evaluation to assess cogn linguistic skills and determine the appropriate speech  Intervention next tx session.        Charges Minutes   SLP treat    Swallow treat 10   Presley Raddle MS [facility] SLP   05/09/2014 3:39 PM  [phone]

## 2014-05-09 NOTE — Plan of Care (Signed)
Problem: Physical Therapy  Goal: Patient condition is improving per Physical Therapy Treatment Plan  Outcome: Progressing  Discharge Recommendation: Acute Rehab  DME Recommended for Discharge:  (already has FWW)    Is an Occupational Therapy Evaluation Indicated at this time? This patient is already on OT caseload.     Treatment/Interventions: Exercise, Gait training, Functional transfer training, LE strengthening/ROM, Endurance training, Bed mobility, Patient/family training  PT Frequency: 3-4x/wk     Early/Progressive Mobility Protocol Level: Step 5:OOB to Chair  (Please See Therapy Evaluation for device and assistance level needed)    Goals:   Goals  Goal Formulation: With patient  Time for Goal Acheivement: By time of discharge  Goals: Select goal  Pt Will Go Supine To Sit: independent, Partly met  Pt Will Perform Sit to Stand: independent, Partly met  Pt Will Ambulate: 101-150 feet, with rolling walker, with supervision, Partly met  Pt Will Go Up / Down Stairs: 1-2 stairs, with supervision, With rail, Not met      [name], PTA  05/09/2014 3:57 PM   [facility] [phone]  [place]# [id number]

## 2014-05-09 NOTE — Plan of Care (Signed)
Problem: Safety  Goal: Patient will be free from injury during hospitalization  Intervention: Provide and maintain safe environment  The patient and care giver's learning abilities have been assessed. Today's individualized plan of care is to monitor safety, has 1:1 sitter, hourly rounding, bed alarm activated all times was discussed with the patient and care giver and agree to it. Patient and care giver demonstrates understanding of disease process, treatment plan, medications and consequences of noncompliance. All questions and concerns were addressed.

## 2014-05-09 NOTE — Progress Notes (Signed)
PROGRESS NOTE    Date Time: 05/09/2014 7:45 PM  Patient Name: [name], [name]      Subjective:   MRI [name] MCA stroke    Carotid neg    Alert RUE weakness improved    Expressive aphasia    Review of Systems:   A comprehensive review of systems was: General ROS: negative for - chills, fever or night sweats  Psychological ROS: negative for - anxiety, depression, disorientation, hallucinations or suicidal ideation  ENT ROS: negative for - headaches, nasal congestion, sinus pain or visual changes  Allergy and Immunology ROS: negative for - itchy/watery eyes, nasal congestion or postnasal drip  Hematological and Lymphatic ROS: negative for - bleeding problems, bruising, pallor or weight loss  Endocrine ROS: negative for - malaise/lethargy or polydipsia/polyuria  Respiratory ROS: negative for - cough, orthopnea, shortness of breath or wheezing  Cardiovascular ROS: negative for - chest pain, dyspnea on exertion, orthopnea or shortness of breath  Gastrointestinal ROS: negative for - abdominal pain, constipation, diarrhea or nausea/vomiting  Genito-Urinary ROS: negative for - change in urinary stream, dysuria, hematuria or nocturia  Musculoskeletal ROS: negative for - joint pain, joint stiffness or joint swelling  Neurological ROS: negative for - confusion, dizziness, headaches, memory loss or speech problems  Dermatological ROS: negative for pruritus, rash and skin lesion changes    Physical Exam:     Filed Vitals:    05/09/14 1737   BP: 151/64   Pulse: 83   Temp: 97.5 F (36.4 C)   Resp: 16   SpO2: 96%       General appearance -awake  Mental status -awake expressive aphasia   Eyes - pupils equal and reactive, extraocular eye movements intact  Nose - normal and patent, no erythema, discharge or polyps  Mouth - mucous membranes moist, pharynx normal without lesions  Neck - supple, no significant adenopathy  Lymphatics - no palpable lymphadenopathy, no hepatosplenomegaly  Chest - clear to auscultation, no wheezes, rales or rhonchi,  symmetric air entry  Heart - normal rate, regular rhythm, normal S1, S2, no murmurs, rubs, clicks or gallops  Abdomen - soft, nontender, nondistended, no masses or organomegaly  Neurological - awake expressive aphasia RUE weakness  Musculoskeletal - no joint tenderness, deformity or swelling  Extremities - peripheral pulses normal, no pedal edema, no clubbing or cyanosis  Skin - normal coloration and turgor, no rashes, no suspicious skin lesions noted    Medications:     Current Facility-Administered Medications   Medication Dose Route Frequency   . albuterol-ipratropium  3 mL Nebulization Q6H SCH   . amLODIPine  10 mg Oral Daily   . atorvastatin  40 mg Oral QHS   . calcitRIOL  0.25 mcg Oral Daily   . cloNIDine  0.2 mg Oral BID   . clopidogrel  75 mg Oral Daily   . darbepoetin alfa (ARANESP) injection  40 mcg Subcutaneous Weekly   . docusate sodium  100 mg Oral Daily   . fluticasone-salmeterol  1 puff Inhalation BID   . heparin (porcine)  5,000 Units Subcutaneous Q12H [facility]   . hydrALAZINE  100 mg Oral Q8H SCH   . isosorbide mononitrate  60 mg Oral Daily   . terazosin  5 mg Oral QHS   . venlafaxine  25 mg Oral BID       Intake and Output Summary (Last 24 hours) at Date Time  No intake or output data in the 24 hours ending 05/09/14 1945  Labs:       Recent Labs  Lab 05/09/14  0425 05/08/14  1914 05/07/14  0609 05/05/14  0318 05/04/14  0507 05/03/14  0205   SODIUM 139 140 143 141 141 141   POTASSIUM 3.7 3.6 4.2 3.9 3.6 3.4*   CHLORIDE 109 107 110 106 105 106   CO2 23 23 22 24 25 25    BUN 6.0* 7.0 7.0 14.0 17.0 24.0*   CREATININE 1.4* 1.5* 1.2* 1.4* 1.4* 1.6*   CALCIUM 8.0* 8.3* 8.2* 8.4* 8.1* 8.1*   ALBUMIN  --   --   --  2.7* 2.5* 2.3*   PROTEIN, TOTAL  --   --   --  6.7  --  5.5*   BILIRUBIN, TOTAL  --   --   --  0.4  --  0.4   ALKALINE PHOSPHATASE  --   --   --  97  --  83   ALT  --   --   --  15  --  14   AST (SGOT)  --   --   --  25  --  16   GLUCOSE 141* 184* 133* 134* 87 87       Recent Labs  Lab  05/04/14  0507   WBC 4.32   HEMOGLOBIN 8.9*   HEMATOCRIT 29.0*   PLATELETS 209               Rads:     Radiology Results (24 Hour)     ** No results found for the last 24 hours. **            Assessment:   CVA R HP aphasia  Renal failure  Hypertension  UTI       Plan:   Bradycardia resolved, avoid BB, clinically stable,      Patient holding food in her mouth with high risk of aspiration, Speech for swallow evaluation     CVA RHP   Aphasia  Carotid neg MRI [name] MCA distribution  On plavix 75 mg      Hx CAD    Hx cardiomyopathy  Hx aortic stenosis     Acute on chronic crt stable, continue with IV hydration and liberal fluid intake     Encephalopathy  metablolic   resolved      DM SSI monitor glucose      R/O sepsis  Hypothermic resolved  Evaluated by ID  UC bld cx on invanz   cx proteus   Treatment completed  COPD duoneb    Hypertension   Stable     PT/OT evaluation and treatment     Signed by: Mosie Epstein, MD  05/09/2014  7:45 PM

## 2014-05-09 NOTE — Progress Notes (Signed)
Following for discharge planning. Pt currently with sitter.  Call placed to pt's dtr, Sheron Nightingale, [phone].  She states her mother was recently discharged from [place] and they would like for her to return.  She was home with home health and going to the [place].  Her dtr is able to be home with her except for when she, herself, is gone for dialysis 3x/week for 4 hours.     Referral placed for [facility] and spoke with liaison, [place], [phone].  With dtr's understanding, SNf referrals placed for a back up plan.

## 2014-05-09 NOTE — Progress Notes (Addendum)
RENAL PROGRESS NOTE    Date Time: 05/09/2014 5:11 PM  Patient Name: [name]  Attending Physician: Laveda Norman, MD    Assessment:   Acute kidney injury requiring dialysis, now recovering. Has been off dialysis.  Chronic kidney disease, likely related to hypertension, vascular disease, and diabetes. Baseline is unclear.  Hypertension,  Fair control.  Anemia of chronic kidney disease, stable.  Acute stroke  Diabetes, well controlled.  Secondary hyperparathyroidism of kidney disease    Plan:   Encourage oral hydration. Monitor the intake.  Monitor urine out put.  Monitor renal function, electrolytes and fluid status.  Monitor the blood pressure and adjust the medication as required, especially in view of recent stroke.  Continue with calcitriol. Recheck phosphate and PTH.  Meds in renal dose and avoid nephrotoxic.  D/W RN.  Subjective:   Pt is seen and examined at bedside.  Alert and awake. In no distress.    Review of Systems:   Aphasic. But did not offer any complaints.    Meds:     Current Facility-Administered Medications   Medication Dose Route Frequency   . albuterol-ipratropium  3 mL Nebulization Q6H SCH   . amLODIPine  10 mg Oral Daily   . atorvastatin  40 mg Oral QHS   . calcitRIOL  0.25 mcg Oral Daily   . cloNIDine  0.2 mg Oral BID   . clopidogrel  75 mg Oral Daily   . darbepoetin alfa (ARANESP) injection  40 mcg Subcutaneous Weekly   . docusate sodium  100 mg Oral Daily   . fluticasone-salmeterol  1 puff Inhalation BID   . heparin (porcine)  5,000 Units Subcutaneous Q12H [facility]   . hydrALAZINE  100 mg Oral Q8H SCH   . isosorbide mononitrate  60 mg Oral Daily   . terazosin  5 mg Oral QHS   . venlafaxine  25 mg Oral BID       Physical Exam:   BP 156/67 mmHg  Pulse 85  Temp(Src) 97.7 F (36.5 C) (Oral)  Resp 16  Ht 1.549 m (5\' 1" )  Wt 66.679 kg (147 lb)  BMI 27.79 kg/m2  SpO2 96%  No intake or output data in the 24 hours ending 05/09/14 1711    General: awake, no acute distress.  No  JVD  Cardiovascular: S1S2,    Lungs: Air entry bilaterally  Abdomen: soft, non-tender, non-distended,   Extremities: no clubbing, cyanosis, or edema    Labs:     No results for input(s): WBC, HGB, HCT, PLT, MCV in the last 72 hours.  Recent Labs      05/09/14   0425  05/08/14   0312   SODIUM  139  140   POTASSIUM  3.7  3.6   CHLORIDE  109  107   CO2  23  23   BUN  6.0*  7.0   CREATININE  1.4*  1.5*   GLUCOSE  141*  184*   CALCIUM  8.0*  8.3*     No results for input(s): AST, ALT, ALKPHOS, PROT, ALB in the last 72 hours.  No results for input(s): PTT, PT, INR in the last 72 hours.                Recent Labs  Lab 05/05/14  1457   CHOLESTEROL 185   TRIGLYCERIDES 124   HDL 53   CALCULATED LDL 107*     Radiology Results (24 Hour)     ** No results found  for the last 24 hours. Remus Blake, MD  [phone]

## 2014-05-09 NOTE — PT Progress Note (Signed)
Physical Therapy Note    [facility]  Physical Therapy Treatment    Patient:  [name] MRN#:  [id number]  Unit:  NEUROVASCULAR [place] Room/Bed:  V7846/N6295-28    Time of treatment: Time Calculation  PT Received On: 05/09/14  Start Time: 1521  Stop Time: 1545  Time Calculation (min): 24 min           Visit #  4    Precautions:   Precautions  Weight Bearing Status: RLE WBAT  Sternal Precautions: other (comment) (Patient previously reports she was still following sx in Dec)  Restricted BP Precautions: other (comment) (pt reports no L/UE)  Other Precautions: contact isolation, recent junctional bradycardia, 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    Updated X-Rays/Tests/Labs:  Lab Results   Component Value Date/Time    HGB 8.9* 05/04/2014 05:07 AM    HEMATOCRIT 29.0* 05/04/2014 05:07 AM    POTASSIUM 3.7 05/09/2014 04:25 AM    SODIUM 139 05/09/2014 04:25 AM    PT INR 1.0 05/05/2014 03:33 AM    TROPONIN I 0.07 05/03/2014 02:05 AM    TROPONIN I 0.09 05/02/2014 01:57 AM    TROPONIN I 0.07 04/30/2014 05:48 PM    TROPONIN I 0.10* 04/30/2014 11:54 AM         Subjective: Patient nonverbal; nodded agreement to therapy        Pain Assessment  Pain Assessment: Wong-Baker FACES  Wong-Baker FACES Pain Rating: Hurts even more  POSS Score: Awake and Alert  Pain Location: Foot  Pain Orientation: Right  Pain Intervention(s): Medication (See eMAR);Repositioned;Ambulation/increased activity    Patient's medical condition is appropriate for Physical Therapy intervention at this time.  Patient is agreeable to participation in the therapy session. Nursing clears patient for therapy.    Objective:  Observation of Patient/Vital Signs: VSS  BP 156/67 mmHg  Pulse 85  Temp(Src) 97.7 F (36.5 C) (Oral)  Resp 16  Ht 1.549 m (5\' 1" )  Wt 66.679 kg (147 lb)  BMI 27.79 kg/m2  SpO2 96%   Reviewed patient chart, PT Eval, and plan of care  PSA at bedside at beginning and end of session.    Patient received in bed with Telemetry and  Intravenous (IV) in place.    Cognition/Neuro Status  Arousal/Alertness: Appropriate responses to stimuli  Attention Span: Appears intact  Orientation Level: Oriented X4  Memory: Appears intact  Following Commands: Follows one step commands with repetition;Follows one step commands with increased time  Safety Awareness: minimal verbal instruction  Insights: Decreased awareness of deficits  Problem Solving: minimal assistance  Behavior: anxious;uncooperative;distractable;impulsive  Motor Planning: apraxia;decreased processing speed  Coordination: [facility] impaired    Musculoskeletal Examination                  Functional Mobility  Supine to Sit: Stand by Assist;Increased Time;using bedrail;HOB raised;to Left (Attempted to right, patient appeared to have difficulty initiating movement once sidelying on R; patient was unable to follow commands or complete motion to sitting. Rolled to Left and was able to situp with SBA)  Sit to Stand: Contact Guard Assist  Stand to Sit: Contact Guard Assist     Locomotion  Ambulation: Contact Guard Assist;Minimal Assist;with front-wheeled walker (Difficulty guiding walker around objects-Min A)  Ambulation Distance (Feet): 5 Feet (2x5)  Pattern:  (Hops on L Foot)  Weight Bearing Observed: NWB;During Standing;During ambulation (Patient did not put R foot down during standing)    Therapeutic Exercise  Straight Leg Raise: 15x ea  Heelslides:  15x ea  Hip Abduction: 15x ea  Knee AROM : 15x ea seated  Ankle Pumps: 20x B     Neuro Re-Ed  Standing Balance: L single leg stance;head turns;eyes closed;standing with perturbations all planes;standing reaching activities;contact guard assist;with support           Educated the patient to role of physical therapy, plan of care, goals of therapy and HEP, safety with mobility and ADLs, energy conservation techniques.  Patient left in bed with call bell within reach. RN notified of session outcome.      Assessment: Patient progressing with therapy; Would  benefit from stay in Acute Rehab 2/2 safety deficits, impulsivity, difficulty with processing movement; and communicative/cognition deficits. Patient unsafe to ambulate alone and has difficulty working around objects and problem solving. Patient will benefit from continued PT to maximize mobility and function.          Plan: Treatment/Interventions: Exercise, Gait training, Functional transfer training, LE strengthening/ROM, Endurance training, Bed mobility, Patient/family training      PT Frequency: 3-4x/wk   Continue plan of care.    Goals: Goals  Goal Formulation: With patient  Time for Goal Acheivement: By time of discharge  Goals: Select goal  Pt Will Go Supine To Sit: independent, Partly met  Pt Will Perform Sit to Stand: independent, Partly met  Pt Will Ambulate: 101-150 feet, with rolling walker, with supervision, Partly met  Pt Will Go Up / Down Stairs: 1-2 stairs, with supervision, With rail, Not met        DME Recommended for Discharge:  (already has FWW)  Discharge Recommendation: Acute Rehab  [name], PTA  05/09/2014 3:58 PM   IAH [phone]  [place]# [id number]

## 2014-05-09 NOTE — Progress Notes (Signed)
Pt's vital signs stable. Appetite poor. Only eating the carbohydrate at all meals. Will not eat the meat or vegetable. Takes applesauce with medications with no problem. Smiles and able to communicated with hand gestures and nods appropriately. Oriented to self only. Sitter at bedside. Safe environment provided. Safety maintained.

## 2014-05-09 NOTE — Plan of Care (Signed)
Problem: Occupational Therapy  Goal: Patient condition is improving per Occupational Therapy Treatment Plan  Outcome: Progressing  Discharge Recommendation: Acute Rehab   DME Recommended for Discharge:  (May benefit from W/C)    OT Frequency Recommended: 3-4x/wk     Is PT evaluation indicated at this time? This patient is already on PT caseload.     Early/Progressive Mobility Protocol Level: Step 6: Ambulate in Room (with assistance)  (Please See Therapy Evaluation for device and assistance level needed)    Treatment Interventions: ADL retraining, Functional transfer training, UE strengthening/ROM, Endurance training, Patient/Family training, Equipment eval/education           Goal Formulation: Patient  Time For Goal Achievement: 5 visits  ADL Goals  Patient will dress upper body: Independent  Patient will dress lower body: Independent  Pt will complete bathing: Supervision  Patient will toilet: Independent  Mobility and Transfer Goals  Pt will perform functional transfers: Independent        Executive Fucntion Goals  Pt will follow energy conservation techniques:  (Goal From Prior Admission)

## 2014-05-09 NOTE — Progress Notes (Signed)
AR following this patient.  Will need isolation room, sitter and discharge plan has to be determined.  [name] will re-assess tomorrow and see patient.  [name], [place] [id number]

## 2014-05-09 NOTE — OT Eval Note (Signed)
[facility]   Occupational Therapy Re-Evaluation     Patient: [name]     MRN#: [id number]   Unit: NEUROVASCULAR [place]  Bed: V4098/J1914-78    Time of treatment: Time Calculation  OT Received On: 05/09/14  Start Time: 1027  Stop Time: 1101  Time Calculation (min): 34 min    Consult received for [name] for OT Re-Evaluation and Treatment.  Patient's medical condition is appropriate for Occupational therapy intervention at this time.    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 female, requiring re-evaluation secondary to patient with stroke RRT on 2/18.     Updated X-Rays/Tests/Labs:  Lab Results   Component Value Date/Time    HGB 8.9* 05/04/2014 05:07 AM    HEMATOCRIT 29.0* 05/04/2014 05:07 AM    POTASSIUM 3.7 05/09/2014 04:25 AM    SODIUM 139 05/09/2014 04:25 AM    PT INR 1.0 05/05/2014 03:33 AM    TROPONIN I 0.07 05/03/2014 02:05 AM    TROPONIN I 0.09 05/02/2014 01:57 AM    TROPONIN I 0.07 04/30/2014 05:48 PM    TROPONIN I 0.10* 04/30/2014 11:54 AM     MRI Brain: Acute ischemia involving the left insular and left anterior temporal lobe (left MCA distribution). No associated evidence of acute hemorrhage.    Social History:  Please see prior evaluation     Subjective: Subjective: Patient nods head thought re-eval/treatment Patient is agreeable to participation in the therapy session. Nursing clears patient for therapy.   Patient Goal: Bathroom, get pain medicine   Pain Assessment  Pain Assessment:   (Patient requests pain meds, but then later denies pain)      Objective:   Inspection/Posture  Inspection/Posture: dressing R heel, midfoot  Observation of Patient/Vital Signs: VSS  Patient received in bed with Telemetry and Intravenous (IV) in place.Sitter present      Cognitive Status and Neuro Exam:  Cognition/Neuro Status  Arousal/Alertness: Appropriate responses to stimuli  Attention Span: Appears intact  Orientation Level: Oriented X4  Memory: Unable to assess  Following Commands: minimal verbal instruction  Safety Awareness: minimal verbal instruction  Insights: Decreased awareness of deficits  Problem Solving: minimal assistance  Behavior: cooperative, anxious  Motor Planning: decreased processing speed  Coordination: [facility] impaired, [facility] impaired  Neuro Status  Behavior: cooperative, anxious  Motor Planning: decreased processing speed  Coordination: [facility] impaired, [facility] impaired       Musculoskeletal Examination  Gross ROM  Right Upper Extremity ROM:  (Patient self limits shoulder ROM-likely 2/2 to sternal prec,)  Left Upper Extremity ROM:  (Patient self limits shoulder ROM-likely 2/2 to sternal prec,)  Gross Strength  Right Upper Extremity Strength: 3+/5  Left Upper Extremity Strength: 4-/5  Tone  Tone: within functional limits    Sensory/Oculomotor Examination  Sensory  Auditory:  (appears WFL)  Tactile - Light Touch:  (UTA)         Activities of Daily Living  Self-care and Home Management  Eating:  (NT)  Grooming: Stand by Assist  Bathing: setup, in  chair  UB Dressing: Stand by Assist  LB Dressing: Stand by Assist  Toileting: Stand by Assist  Functional Transfers: Contact Guard Assist    Functional Mobility:  Mobility and Transfers  Rolling: Stand by Assist  Supine to Sit: Stand by Assist  Sit to Supine: Minimal Assist  Sit to Stand: Contact Guard Assist  Bed to Chair: Minimal Assist  Functional Mobility/Ambulation: Contact Guard Assist (~4 steps to bathroom and back)     Balance  Balance  Static Sitting  Balance: fair  Dyanamic Sitting Balance: fair    Participation and Activity Tolerance  Participation and Endurance  Participation Effort: good  Endurance: Tolerates < 10 min exercise, no significant change in vital signs      Educated the patient to role of occupational therapy, plan of care, goals of therapy and discharge planning.    Patient left in bed with sitter present. RN notified of session outcome.    Assessment: CACHET MCCUTCHEN is a 64 y.o. female admitted 04/28/2014 presenting with the following Impairments: Assessment: decreased ROM, decreased strength, balance deficits, decreased independence with ADLs, decreased independence with IADLs. Patient with significant change in expressive language skills post CVA. Patient also with decreased coordination/motor planning. Pt would benefit from skilled Occupational Therapy to maximize safety and return to prior level of independence with ADLs.    Treatment: Patient originally nods head yes, stating she's having pain. Points to stomach, however with therapist asks if stomach was itchy (patient was scratching), patient nods head again. Question consistency/accuracy of head nods at this time. Patient does shake her head no at times as well, which seems to be more accurate. Offered patient pen and paper, patient only able to write "the" and then was unable to write anything else. Patient attempted to speak to this therapist, however was unable to articulate anything. Patient nodded that she did need to use restroom, however attempted to stop therapist from bringing IV pole around bed. Eventually patient agreeable and patient ambulated to bathroom with CGA. Patient returned sitting on EOB. Discussed D/C rec of acute rehab with emphasis on cognition and communication strategies. Patient appeared agreeable. This therapist left picture board with "needs" and with alphabet with patient and sitter. Attempted to have patient point to board, however patient either did not need  anything or was unable to use board. Sitter agreeable to keep trying with patient.     Rehabilitation Potential: Prognosis: Good, With continued OT s/p acute discharge    Plan: OT Frequency Recommended: 3-4x/wk   Treatment Interventions: ADL retraining, Functional transfer training, UE strengthening/ROM, Endurance training, Patient/Family training, Equipment eval/education     Risks/benefits/POC discussed    G codes: yes  Self Care, Current Status (L8756): CK   Self Care, Goal Status (E3329): CJ        Goals: Time For Goal Achievement: 5 visits  ADL Goals  Patient will dress upper body: Independent  Patient will dress lower body: Independent  Pt will complete bathing: Supervision  Patient will toilet: Independent  Mobility and Transfer Goals  Pt will perform functional transfers: Independent        Executive Fucntion Goals  Pt will follow energy conservation techniques:  (Goal From Prior Admission)         DME Recommended for Discharge:  (May benefit from W/C)  Discharge Recommendation: Acute Rehab     Lennox Laity, OTR/L #[id number]  05/09/2014 11:34 AM

## 2014-05-09 NOTE — Plan of Care (Addendum)
Problem: Day 5- Stroke  Goal: Neurovascular Status is Stable or Improving  Outcome: Progressing  Pt was introduced to incoming nurse, Today's individualized plan of care for neuro checks, neb tx, wound care, and I & O's, was verbalized with the patient during bedside report, pt unable to verbalize agreement. Patient does not demonstrate understanding of disease process, treatment plan, medications and consequences of noncompliance. Will continue to monitor. Intentional hourly rounding continued. Sitter at bedside. Safe environment provided.

## 2014-05-10 LAB — HEMOLYSIS INDEX
Hemolysis Index: 2 (ref 0–18)
Hemolysis Index: 17 (ref 0–18)

## 2014-05-10 LAB — BASIC METABOLIC PANEL
Anion Gap: 6 (ref 5.0–15.0)
BUN: 6 mg/dL — ABNORMAL LOW (ref 7.0–19.0)
CO2: 24 mEq/L (ref 22–29)
Calcium: 7.8 mg/dL — ABNORMAL LOW (ref 8.5–10.5)
Chloride: 109 mEq/L (ref 100–111)
Creatinine: 1.4 mg/dL — ABNORMAL HIGH (ref 0.6–1.0)
Glucose: 156 mg/dL — ABNORMAL HIGH (ref 70–100)
Potassium: 3.6 mEq/L (ref 3.5–5.1)
Sodium: 139 mEq/L (ref 136–145)

## 2014-05-10 LAB — GLUCOSE WHOLE BLOOD - POCT
Whole Blood Glucose POCT: 155 mg/dL — ABNORMAL HIGH (ref 70–100)
Whole Blood Glucose POCT: 169 mg/dL — ABNORMAL HIGH (ref 70–100)
Whole Blood Glucose POCT: 217 mg/dL — ABNORMAL HIGH (ref 70–100)
Whole Blood Glucose POCT: 160 mg/dL — ABNORMAL HIGH (ref 70–100)

## 2014-05-10 LAB — GFR: EGFR: 45.7

## 2014-05-10 LAB — VITAMIN D,25 OH,TOTAL: Vitamin D, 25 OH, Total: 12 ng/mL — ABNORMAL LOW (ref 30–100)

## 2014-05-10 LAB — PTH, INTACT: PTH Intact: 228.3 pg/mL — ABNORMAL HIGH (ref 9.0–72.0)

## 2014-05-10 LAB — PHOSPHORUS: Phosphorus: 2.2 mg/dL — ABNORMAL LOW (ref 2.3–4.7)

## 2014-05-10 NOTE — Progress Notes (Signed)
RENAL PROGRESS NOTE    Date Time: 05/10/2014 6:51 PM  Patient Name: [name]  Attending Physician: Laveda Norman, MD    Assessment:   Acute kidney injury required dialysis. Has been off dialysis.  Renal function stable.   Chronic kidney disease, likely related to hypertension, vascular disease, and diabetes. Baseline is unclear.  Hypertension,  Fair control.  Anemia of chronic kidney disease, stable.  Acute stroke  Diabetes,   Secondary hyperparathyroidism of kidney disease  Plan:   Discontinue femoral catheter.  Encourage oral hydration. Monitor the intake.  Follow urine out put.  Monitor renal function, electrolytes and fluid status.  Monitor the blood pressure and adjust the medication as required, especially in view of recent stroke.  Continue with calcitriol. Recheck phosphate and PTH.  Meds in renal dose and avoid nephrotoxic.  D/W RN.  Subjective:   Pt is seen and examined at bedside.  Alert and awake. In no distress.    Review of Systems:   Aphasic. But did not offer any complaints.    Meds:     Current Facility-Administered Medications   Medication Dose Route Frequency   . albuterol-ipratropium  3 mL Nebulization Q6H SCH   . amLODIPine  10 mg Oral Daily   . atorvastatin  40 mg Oral QHS   . calcitRIOL  0.25 mcg Oral Daily   . cloNIDine  0.2 mg Oral BID   . clopidogrel  75 mg Oral Daily   . darbepoetin alfa (ARANESP) injection  40 mcg Subcutaneous Weekly   . docusate sodium  100 mg Oral Daily   . fluticasone-salmeterol  1 puff Inhalation BID   . heparin (porcine)  5,000 Units Subcutaneous Q12H [facility]   . hydrALAZINE  100 mg Oral Q8H SCH   . isosorbide mononitrate  60 mg Oral Daily   . terazosin  5 mg Oral QHS   . venlafaxine  25 mg Oral BID       Physical Exam:   BP 148/58 mmHg  Pulse 84  Temp(Src) 97.2 F (36.2 C) (Oral)  Resp 18  Ht 1.549 m (5\' 1" )  Wt 66.679 kg (147 lb)  BMI 27.79 kg/m2  SpO2 95%    Intake/Output Summary (Last 24 hours) at 05/10/14 1851  Last data filed at 05/10/14 1400   Gross  per 24 hour   Intake    956 ml   Output      0 ml   Net    956 ml       General: awake, no acute distress.  No JVD  Cardiovascular: S1S2,    Lungs: Air entry bilaterally  Abdomen: soft, non-tender, non-distended,   Extremities: no clubbing, cyanosis, or edema    Labs:     No results for input(s): WBC, HGB, HCT, PLT, MCV in the last 72 hours.  Recent Labs      05/10/14   0450  05/09/14   0425   SODIUM  139  139   POTASSIUM  3.6  3.7   CHLORIDE  109  109   CO2  24  23   BUN  6.0*  6.0*   CREATININE  1.4*  1.4*   GLUCOSE  156*  141*   CALCIUM  7.8*  8.0*   PHOSPHORUS  2.2*   --      No results for input(s): AST, ALT, ALKPHOS, PROT, ALB in the last 72 hours.  No results for input(s): PTT, PT, INR in the last 72 hours.  Recent Labs  Lab 05/05/14  1457   CHOLESTEROL 185   TRIGLYCERIDES 124   HDL 53   CALCULATED LDL 107*     Radiology Results (24 Hour)     ** No results found for the last 24 hours. Mosie Lukes, MD  [phone]

## 2014-05-10 NOTE — Progress Notes (Signed)
Patient vitals signs stable. Still nonverbal. Patient communicating with hand gestures, nodding appropriately. Also using reference sheets for communication and writing. Appetite still poor. MD Leighton Roach to bedside, requested bladder scan because patient used bathroom x1. Bladder scan done with 23ml in bladder. MD Leighton Roach also requested to remove femoral catheter due to stable kidney function. Charge nurse aware, IV team notified and stated they do not remove that type of catheter. Information passed to night nurse to follow-up in am.

## 2014-05-10 NOTE — Progress Notes (Signed)
PROGRESS NOTE    Date Time: 05/10/2014 4:59 PM  Patient Name: [name],[name]      Subjective:   Bun 6  crt 1.4Wbc 5.9  hct 29    Temp 98  SBP 162    MRI [name] MCA stroke    Carotid neg    Alert RUE weakness improved    Expressive aphasia     Review of Systems:   A comprehensive review of systems was: General ROS: negative for - chills, fever or night sweats  Psychological ROS: negative for - anxiety, depression, disorientation, hallucinations or suicidal ideation  ENT ROS: negative for - headaches, nasal congestion, sinus pain or visual changes  Allergy and Immunology ROS: negative for - itchy/watery eyes, nasal congestion or postnasal drip  Hematological and Lymphatic ROS: negative for - bleeding problems, bruising, pallor or weight loss  Endocrine ROS: negative for - malaise/lethargy or polydipsia/polyuria  Respiratory ROS: negative for - cough, orthopnea, shortness of breath or wheezing  Cardiovascular ROS: negative for - chest pain, dyspnea on exertion, orthopnea or shortness of breath  Gastrointestinal ROS: negative for - abdominal pain, constipation, diarrhea or nausea/vomiting  Genito-Urinary ROS: negative for - change in urinary stream, dysuria, hematuria or nocturia  Musculoskeletal ROS: negative for - joint pain, joint stiffness or joint swelling  Neurological ROS: negative for - confusion, dizziness, headaches, memory loss or speech problems  Dermatological ROS: negative for pruritus, rash and skin lesion changes    Physical Exam:     Filed Vitals:    05/10/14 1543   BP: 170/68   Pulse: 88   Temp: 97.5 F (36.4 C)   Resp: 16   SpO2: 95%       General appearance -awake  Mental status -awake expressive aphasia   Eyes - pupils equal and reactive, extraocular eye movements intact  Nose - normal and patent, no erythema, discharge or polyps  Mouth - mucous membranes moist, pharynx normal without lesions  Neck - supple, no significant adenopathy  Lymphatics - no palpable lymphadenopathy, no hepatosplenomegaly  Chest  - clear to auscultation, no wheezes, rales or rhonchi, symmetric air entry  Heart - normal rate, regular rhythm, normal S1, S2, no murmurs, rubs, clicks or gallops  Abdomen - soft, nontender, nondistended, no masses or organomegaly  Neurological - awake expressive aphasia RUE weakness  Musculoskeletal - no joint tenderness, deformity or swelling  Extremities - peripheral pulses normal, no pedal edema, no clubbing or cyanosis  Skin - normal coloration and turgor, no rashes, no suspicious skin lesions noted    Medications:     Current Facility-Administered Medications   Medication Dose Route Frequency   . albuterol-ipratropium  3 mL Nebulization Q6H [facility]   . amLODIPine  10 mg Oral Daily   . atorvastatin  40 mg Oral QHS   . calcitRIOL  0.25 mcg Oral Daily   . cloNIDine  0.2 mg Oral BID   . clopidogrel  75 mg Oral Daily   . darbepoetin alfa (ARANESP) injection  40 mcg Subcutaneous Weekly   . docusate sodium  100 mg Oral Daily   . fluticasone-salmeterol  1 puff Inhalation BID   . heparin (porcine)  5,000 Units Subcutaneous Q12H [facility]   . hydrALAZINE  100 mg Oral Q8H SCH   . isosorbide mononitrate  60 mg Oral Daily   . terazosin  5 mg Oral QHS   . venlafaxine  25 mg Oral BID       Intake and Output Summary (Last 24 hours)  at Date Time    Intake/Output Summary (Last 24 hours) at 05/10/14 1659  Last data filed at 05/10/14 1400   Gross per 24 hour   Intake   1074 ml   Output      0 ml   Net   1074 ml           Labs:       Recent Labs  Lab 05/10/14  0450 05/09/14  0425 05/08/14  0312  05/05/14  0318 05/04/14  0507   SODIUM 139 139 140 More results in Results Review 141 141   POTASSIUM 3.6 3.7 3.6 More results in Results Review 3.9 3.6   CHLORIDE 109 109 107 More results in Results Review 106 105   CO2 24 23 23  More results in Results Review 24 25   BUN 6.0* 6.0* 7.0 More results in Results Review 14.0 17.0   CREATININE 1.4* 1.4* 1.5* More results in Results Review 1.4* 1.4*   CALCIUM 7.8* 8.0* 8.3* More results in Results Review  8.4* 8.1*   ALBUMIN  --   --   --   --  2.7* 2.5*   PROTEIN, TOTAL  --   --   --   --  6.7  --    BILIRUBIN, TOTAL  --   --   --   --  0.4  --    ALKALINE PHOSPHATASE  --   --   --   --  97  --    ALT  --   --   --   --  15  --    AST (SGOT)  --   --   --   --  25  --    GLUCOSE 156* 141* 184* More results in Results Review 134* 87   More results in Results Review = values in this interval not displayed.    Recent Labs  Lab 05/04/14  0507   WBC 4.32   HEMOGLOBIN 8.9*   HEMATOCRIT 29.0*   PLATELETS 209               Rads:     Radiology Results (24 Hour)     ** No results found for the last 24 hours. **            Assessment:     CVA R HP aphasia  Renal failure  Hypertension  UTI       Plan:   Bradycardia resolved      CVA RHP   Aphasia  Carotid neg MRI [name] MCA distribution  On plavix 75 mg      Hx CAD    Hx cardiomyopathy  Hx aortic stenosis     Acute on chronic crt stable    Encephalopathy  metablolic   resolved      DM SSI monitor glucose      R/O sepsis  Hypothermic resolved  Evaluated by ID  UC bld cx on invanz   cx proteus   Treatment completed  COPD duoneb    Hypertension   Stable   Signed by: Laveda Norman, MD  05/10/2014  4:59 PM

## 2014-05-10 NOTE — Plan of Care (Signed)
Problem: Safety  Goal: Patient will be free from injury during hospitalization  Outcome: Progressing  The patient and care giver's learning abilities have been assessed. Today's individualized plan of care to Patient safety, monitor intake and output,wound care,IVF .Safety and aspiration precaution cont to implement at all times.POC was discussed with the patient and care giver and agree to it. Patient and care giver demonstrates understanding of disease process, treatment plan, medications and consequences of noncompliance. All questions and concerns were addressed.

## 2014-05-10 NOTE — Plan of Care (Addendum)
Problem: Day 6- Stroke  Goal: Neurovascular Status is Stable or Improving  Outcome: Progressing  Pt was introduced to incoming nurse, Today's individualized plan of care for neuro checks, neb tx, wound care, and I & O's, was verbalized with the patient during bedside report, pt unable to verbalize agreement. Patient does not demonstrate understanding of disease process, treatment plan, medications and consequences of noncompliance. Will continue to monitor. Intentional hourly rounding continued. Sitter at bedside. Safe environment provided

## 2014-05-11 DIAGNOSIS — G9340 Encephalopathy, unspecified: Secondary | ICD-10-CM

## 2014-05-11 LAB — GLUCOSE WHOLE BLOOD - POCT
Whole Blood Glucose POCT: 140 mg/dL — ABNORMAL HIGH (ref 70–100)
Whole Blood Glucose POCT: 181 mg/dL — ABNORMAL HIGH (ref 70–100)
Whole Blood Glucose POCT: 170 mg/dL — ABNORMAL HIGH (ref 70–100)
Whole Blood Glucose POCT: 170 mg/dL — ABNORMAL HIGH (ref 70–100)

## 2014-05-11 NOTE — Plan of Care (Deleted)
Problem: Day 4- Stroke  Goal: Neurovascular Status is Stable or Improving  Outcome: Progressing  Pt still nonverbal, only nods her head to communicate.  Physical therapy seen today.  No new neuro deficits noted, will continue to monitor

## 2014-05-11 NOTE — SLP Eval Note (Addendum)
[facility]  Speech and Language Therapy Evaluation     Patient: [name]    MRN#: [id number]       Consult received for [name] for SLP Evaluation and Treatment.    Current Hospitalization:    Medical Diagnosis: Diabetes mellitus [E11.9]    History of Present Illness: [name] is a 64 y.o. female admitted on 04/28/2014 with   The patient is a 64 year old female with history of coronary artery bypass  graft, moved here from [place], who presents to the emergency room with  complaints of dizziness and lethargy. The patient noted to be bradycardiac  with heart rate in the 30s, external pacemaker placed. The patient has  received IV glucagon. The patient took her Coreg morning. She is being  admitted to CCU for further treatment and evaluation.  XR chest AP 2/18:  IMPRESSION:   FINDINGS/  MRI 05/05/14  IMPRESSION:   Acute ischemia involving the left insular and left anterior temporal  lobe (left MCA distribution). No associated evidence of acute  hemorrhage.  No significant change from most recent examination.  Stable likely congestive heart failure with cardiomegaly, pulmonary  edema, right pleural effusion, and right greater than left bibasilar  density. No pneumothorax. Superimposed infection cannot be excluded.    Patient Active Problem List   Diagnosis   . NSTEMI (non-ST elevated myocardial infarction)   . Hypertensive urgency   . PAD (peripheral artery disease)   . Arterial leg ulcer   . Chronic obstructive pulmonary disease, unspecified COPD type   . Ischemic cardiomyopathy   . CAD (coronary artery disease)   . Diabetes   . Chronic kidney disease   . Debilitated   . Complete heart block   . Junctional bradycardia   . Hyperkalemia   . Renal failure   . Bradycardia   . Acute on chronic renal failure   . Encephalopathy   . Azotemia   . Coronary artery disease involving native coronary artery, angina presence unspecified, unspecified whether native or transplanted heart        Past  Medical/Surgical History:  Past Medical History   Diagnosis Date   . Diabetes mellitus    . Chronic kidney disease    . Myocardial infarction    . Hypertension    . Hyperlipidemia    . Peripheral arterial disease      06/28/13 right superficial femoral artery stenosis, tibial peroneal trunk stenosis, left CVL a stenosis, treated with angiography and angioplasty.   . Coronary artery disease      Left circumflex artery presumed DES.,  Chronically occluded right coronary artery stent   . Anemia      She redid to chronic disease and iron deficiency.     . Arterial leg ulcer      2014, receiving home health   . Chronic obstructive pulmonary disease      Previously on Spiriva and Advair      Past Surgical History   Procedure Laterality Date   . Cardiac angiography and angioplasty  06/2013   . Colonoscopy  Unknown   . Tubal ligation     . Coronary artery bypass N/A 03/04/2014     Procedure: CORONARY ARTERY BYPASS;  Surgeon: Austin Miles, MD;  Location: [facility] HEART OR;  Service: Cardiovascular;  Laterality: N/A;  LIMA to LAD  SVG to OM   . Endoscopic,vein harvest Left 03/04/2014     Procedure: ENDOSCOPIC,VEIN HARVEST;  Surgeon: Austin Miles, MD;  Location: [facility] HEART OR;  Service: Cardiovascular;  Laterality: Left;  By D. Lazarus Gowda, RNFA. Left groin to mid-calf.   Rhae Hammock N/A 03/04/2014     Procedure: TEE;  Surgeon: Austin Miles, MD;  Location: [facility] HEART OR;  Service: Cardiovascular;  Laterality: N/A;  probe [phone]         Social History:  Prior Level of Function  Prior level of function: Ambulates with assistive device, Independent with ADLs  Assistive Device: None  Baseline Activity Level: Household ambulation  Driving: does not drive  Dressing - Upper Body: independent  Dressing - Lower Body: independent  Cooking: Yes  Feeding: independent  Bathing: independent  Grooming: independent  Toileting: independent  Employment: Retired  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 Nursing clears patient for therapy. Patient's medical condition is appropriate for Speech therapy intervention at this time.       Objective:  Observation of Patient/Vital Signs:  Patient is in bed with telemetry in place.    Cognitive Status and Neuro Exam:       Oral Motor Assessment: Severe dysarthria / suspect apraxia        Auditory Comprehension: intact for simple and two step command       Reading Comprehension: not assessed      Treatment Activities: informal speech and language evaluation  Educated the patient to role of speech therapy, plan of care and goals of therapy    Clinical Impression: [name] is a 64 y.o. female admitted 04/28/2014 for Diabetes mellitus [E11.9] presenting with  . Mod to severe expressive aphasia / severe dysarthria and suspect apraxia. Auditory comprehension task was completed with 100% accuracy following simple direction and 80% for multi step commands. Pt was oriented to month and year / difficulty noted with place. Pt was able to use writing as a form of communication for single and two word phrases. Confrontational receptive word recognition was completed with 100% accuracy , target pic identification task was also completed with 100% accuracy. Pt demonstrated utilizing communication board for communication modality 2/2 poor visual acuity per patient's report.   Pt was also seen for swallow tx and was able to tolerate soft solid trial with min oral residue and adequate Hyolaryngeal excursion per digital palpation. Pt also tolerated thin liquid x 30 cc via straw with isolated sips.  Recommend diet upgrade to mechanical soft and thin liquids via straw slow isolated  sips.  Therapy Diagnosis: d/w patient and RN    Expected disposition:    TBD    Plan:    speech and dysphagia tx 1-4 x week until discharge      Goals: Goals to be achieved in Time For Goal Achievement: 5 visits      pt will be oriented x 4 x 1 session  Pt will produce automatic speech responses to greeting and digital counting at least one word will be produced with fair intelligibility  Pt will show confidence in the use of communication board using alphabet board to communicate intend that is not present in board x 2 consecutive sessions.  Presley Raddle MS [facility] SLP   05/11/2014 2:45 PM  [phone]

## 2014-05-11 NOTE — Plan of Care (Signed)
Problem: Neurological Deficit  Goal: Neurological status is stable or improving  Outcome: Progressing  The patient and care giver's learning abilities have been assessed. Today's individualized plan of care to Patient safety,wound care,monitor intake and outuput,monitor BP,wound care  And PT/OT.POC was discussed with the patient and care giver and agree to it. Patient and care giver demonstrates understanding of disease process, treatment plan, medications and consequences of noncompliance. All questions and concerns were addressed.

## 2014-05-11 NOTE — Plan of Care (Signed)
Problem: Physical Therapy  Goal: Patient condition is improving per Physical Therapy Treatment Plan  Outcome: Progressing  Discharge Recommendation: Acute Rehab  DME Recommended for Discharge:  (already has FWW)    Is an Occupational Therapy Evaluation Indicated at this time? This patient is already on OT caseload.     Treatment/Interventions: Exercise, Gait training, Functional transfer training, LE strengthening/ROM, Endurance training, Bed mobility, Patient/family training  PT Frequency: 3-4x/wk     Early/Progressive Mobility Protocol Level: Step 6: Ambulate in Room (with assistance)  (Please See Therapy Evaluation for device and assistance level needed)    Goals:   Goals  Goal Formulation: With patient  Time for Goal Acheivement: By time of discharge  Goals: Select goal  Pt Will Go Supine To Sit: independent, Partly met  Pt Will Perform Sit to Stand: independent, Partly met  Pt Will Ambulate: 101-150 feet, with rolling walker, with supervision, Partly met  Pt Will Go Up / Down Stairs: 1-2 stairs, with supervision, With rail, Not met

## 2014-05-11 NOTE — Progress Notes (Signed)
PROGRESS NOTE    Date Time: 05/11/2014 2:34 PM  Patient Name: [name],[name]      Subjective:   Bun 6  crt 1.4Wbc 5.9  hct 29    Temp 98  SBP 162    MRI [name] MCA stroke    Carotid neg    Alert RUE weakness improved    Expressive aphasia     Review of Systems:   A comprehensive review of systems was: General ROS: negative for - chills, fever or night sweats  Psychological ROS: negative for - anxiety, depression, disorientation, hallucinations or suicidal ideation  ENT ROS: negative for - headaches, nasal congestion, sinus pain or visual changes  Allergy and Immunology ROS: negative for - itchy/watery eyes, nasal congestion or postnasal drip  Hematological and Lymphatic ROS: negative for - bleeding problems, bruising, pallor or weight loss  Endocrine ROS: negative for - malaise/lethargy or polydipsia/polyuria  Respiratory ROS: negative for - cough, orthopnea, shortness of breath or wheezing  Cardiovascular ROS: negative for - chest pain, dyspnea on exertion, orthopnea or shortness of breath  Gastrointestinal ROS: negative for - abdominal pain, constipation, diarrhea or nausea/vomiting  Genito-Urinary ROS: negative for - change in urinary stream, dysuria, hematuria or nocturia  Musculoskeletal ROS: negative for - joint pain, joint stiffness or joint swelling  Neurological ROS: negative for - confusion, dizziness, headaches, memory loss or speech problems  Dermatological ROS: negative for pruritus, rash and skin lesion changes    Physical Exam:     Filed Vitals:    05/11/14 1230   BP: 141/66   Pulse: 86   Temp: 97.7 F (36.5 C)   Resp: 16   SpO2: 96%       General appearance -awake  Mental status -awake expressive aphasia   Eyes - pupils equal and reactive, extraocular eye movements intact  Nose - normal and patent, no erythema, discharge or polyps  Mouth - mucous membranes moist, pharynx normal without lesions  Neck - supple, no significant adenopathy  Lymphatics - no palpable lymphadenopathy, no hepatosplenomegaly  Chest  - clear to auscultation, no wheezes, rales or rhonchi, symmetric air entry  Heart - normal rate, regular rhythm, normal S1, S2, no murmurs, rubs, clicks or gallops  Abdomen - soft, nontender, nondistended, no masses or organomegaly  Neurological - awake expressive aphasia RUE weakness  Musculoskeletal - no joint tenderness, deformity or swelling  Extremities - peripheral pulses normal, no pedal edema, no clubbing or cyanosis  Skin - normal coloration and turgor, no rashes, no suspicious skin lesions noted    Medications:     Current Facility-Administered Medications   Medication Dose Route Frequency   . albuterol-ipratropium  3 mL Nebulization Q6H [facility]   . amLODIPine  10 mg Oral Daily   . atorvastatin  40 mg Oral QHS   . calcitRIOL  0.25 mcg Oral Daily   . cloNIDine  0.2 mg Oral BID   . clopidogrel  75 mg Oral Daily   . darbepoetin alfa (ARANESP) injection  40 mcg Subcutaneous Weekly   . docusate sodium  100 mg Oral Daily   . fluticasone-salmeterol  1 puff Inhalation BID   . heparin (porcine)  5,000 Units Subcutaneous Q12H [facility]   . hydrALAZINE  100 mg Oral Q8H [facility]   . isosorbide mononitrate  60 mg Oral Daily   . terazosin  5 mg Oral QHS   . venlafaxine  25 mg Oral BID       Intake and Output Summary (Last 24 hours)  at Date Time    Intake/Output Summary (Last 24 hours) at 05/11/14 1434  Last data filed at 05/11/14 0600   Gross per 24 hour   Intake 1327.17 ml   Output    300 ml   Net 1027.17 ml           Labs:       Recent Labs  Lab 05/10/14  0450 05/09/14  0425 05/08/14  0312  05/05/14  0318   SODIUM 139 139 140 More results in Results Review 141   POTASSIUM 3.6 3.7 3.6 More results in Results Review 3.9   CHLORIDE 109 109 107 More results in Results Review 106   CO2 24 23 23  More results in Results Review 24   BUN 6.0* 6.0* 7.0 More results in Results Review 14.0   CREATININE 1.4* 1.4* 1.5* More results in Results Review 1.4*   CALCIUM 7.8* 8.0* 8.3* More results in Results Review 8.4*   ALBUMIN  --   --   --   --   2.7*   PROTEIN, TOTAL  --   --   --   --  6.7   BILIRUBIN, TOTAL  --   --   --   --  0.4   ALKALINE PHOSPHATASE  --   --   --   --  97   ALT  --   --   --   --  15   AST (SGOT)  --   --   --   --  25   GLUCOSE 156* 141* 184* More results in Results Review 134*   More results in Results Review = values in this interval not displayed.                Rads:     Radiology Results (24 Hour)     ** No results found for the last 24 hours. **            Assessment:     CVA R HP aphasia  Renal failure  Hypertension  UTI       Plan:   Bradycardia resolved      CVA RHP   Aphasia  Carotid neg MRI [name] MCA distribution  On plavix 75 mg      Hx CAD    Hx cardiomyopathy  Hx aortic stenosis     Acute on chronic crt stable    Encephalopathy  metablolic   resolved      DM SSI monitor glucose      R/O sepsis  Hypothermic resolved  Evaluated by ID  UC bld cx on invanz   cx proteus   Treatment completed  COPD duoneb    Hypertension   Stable   Signed by: Laveda Norman, MD  05/11/2014  2:34 PM

## 2014-05-11 NOTE — Progress Notes (Signed)
[facility] Progress Note    Date Time: 05/11/2014 12:05 PM  Patient Name: [name], [name]    CC: R side weakness    Assessment:   64 y/o female with multiple cardiovascular risk factors with R facial droop and right side weakness in setting of [name] MCA territory CVA. She is able to follow commands more consistently. Given acuity and appearance of stroke on imaging, differential is embolic vs thrombotic vs hypoperfusion in setting of bradycardia. She is on Plavix.     Patient Active Problem List   Diagnosis   . NSTEMI (non-ST elevated myocardial infarction)   . Hypertensive urgency   . PAD (peripheral artery disease)   . Arterial leg ulcer   . Chronic obstructive pulmonary disease, unspecified COPD type   . Ischemic cardiomyopathy   . CAD (coronary artery disease)   . Diabetes   . Chronic kidney disease   . Debilitated   . Complete heart block   . Junctional bradycardia   . Hyperkalemia   . Renal failure   . Bradycardia   . Acute on chronic renal failure   . Encephalopathy   . Azotemia   . Coronary artery disease involving native coronary artery, angina presence unspecified, unspecified whether native or transplanted heart       Plan:   -Continue Plavix for now  -Continue Statin; LDL goal <70  -Would recommend TEE to help r/o cardioembolic etiology; paged Cardiology, have not yet connected. Will attempt again tomorrow to discuss.   -Speech, PT/OT; recommend communication board   -Avoid hypotension  -DVT prophylaxis  -Placement planning.     Interval History/Subjective:   Pt remains with expressive aphasia. She is better able to follow commands today. She attempts speech, however remains non-verbal. Unable to obtain subjective history. Symptoms moderate to severe, persistent since admission. Not alleviated by external factors.     Medications:   Reviewed    Current Facility-Administered Medications   Medication Dose Route Frequency   . albuterol-ipratropium  3 mL Nebulization Q6H SCH   . amLODIPine  10 mg Oral Daily   .  atorvastatin  40 mg Oral QHS   . calcitRIOL  0.25 mcg Oral Daily   . cloNIDine  0.2 mg Oral BID   . clopidogrel  75 mg Oral Daily   . darbepoetin alfa (ARANESP) injection  40 mcg Subcutaneous Weekly   . docusate sodium  100 mg Oral Daily   . fluticasone-salmeterol  1 puff Inhalation BID   . heparin (porcine)  5,000 Units Subcutaneous Q12H [facility]   . hydrALAZINE  100 mg Oral Q8H SCH   . isosorbide mononitrate  60 mg Oral Daily   . terazosin  5 mg Oral QHS   . venlafaxine  25 mg Oral BID       Review of Systems:   Neurological ROS: Limited. Pt unable.     Physical Exam:   Temp:  [96.3 F (35.7 C)-98.6 F (37 C)] 96.3 F (35.7 C)  Heart Rate:  [77-88] 85  Resp Rate:  [16-19] 16  BP: (139-170)/(58-79) 155/68 mmHg    Vital Signs:  Reviewed. Stable as noted above.     General: The patient was well developed female. Cooperative with exam. Sitting up in bed. Sitter at bedside.   Abd: Soft, NT to palpation.   Extremities: no pedal edema, extremities normal in color; right foot wrapped in dressing    Mental Status: The patient was awake and alert. She nods yes/no appropriately. No speech. She has  expressive aphasia. She is able to follow commands appropriately. Normal affect.     Cranial nerves:   -CN III, IV, VI: Pupils equal, round, and reactive to light; extraocular movements intact; no ptosis              -CN VII: Remains with R facial asymmetry  -CN VIII: Hearing intact to conversational speech   -CN XII: Tongue is midline    Motor: Slight muscle atrophy bilateral lower extremities. She is 4/5 RUE, 4+/5 RLE. She is full strength on the left.     Reflexes:  R / [name]     R / [name]  Biceps  2 / 2  Knees  1/1  Triceps 2 / 2  Ankles  1/1  BR                   2 / 2  Plantars Flexor / Flexor    Coordination: No overt tremors. No truncal ataxia    Gait: NT 2/2 safety concerns.     Labs:     Results     Procedure Component Value Units Date/Time    Glucose Whole Blood - POCT [[id number]]  (Abnormal) Collected:  05/11/14 0846     POCT -  Glucose Whole blood 140 (H) mg/dL Updated:  63/87/56 4332       Rads:   No interval neuro imaging.     Signed by:   Teryl Lucy, Cordelia Poche, MMS  Physician Assistant  [facility]  Spectra [phone]    Please see attending physician note that follows this mid-level encounter.       Patient seen and examined. Agree with above.  Expressive aphasia persists, but she is able to follow commands on the right.  Continue Plavix and Statin.  Plan for TEE for presumably cardioembolic source.    Tydus Sanmiguel B. Lance Coon, MD  [facility]  Medical Director of Neuroscience and Stroke  [facility]  [phone]  [phone]

## 2014-05-11 NOTE — PT Progress Note (Addendum)
Physical Therapy Note    [facility]  Physical Therapy Treatment    Patient:  [name] MRN#:  [id number]  Unit:  NEUROVASCULAR [place] Room/Bed:  T0160/F0932-35    Time of treatment: Time Calculation  PT Received On: 05/11/14  Start Time: 1158  Stop Time: 1236  Time Calculation (min): 38 min           Visit # 5    Precautions:   Precautions  Weight Bearing Status: RLE WBAT  Sternal Precautions: other (comment) (Patient previously reports she was still following sx in Dec)  Restricted BP Precautions: other (comment) (pt reports no L/UE)  Other Precautions: contact isolation, recent junctional bradycardia, 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  Patient is agreeable to participation in the therapy session. Nursing clears patient for therapy.    Objective:  Observation of Patient/Vital Signs:   Filed Vitals:    05/11/14    BP: 141/66   Pulse: 86       Resp: 16   SpO2: 96%         Patient received in bed with Telemetry and Intravenous (IV) in place.                           Functional Mobility  Supine to Sit: Stand by Assist (and increased time)  Scooting to [facility]: Supervision  Scooting to EOB: Supervision  Sit to Supine: Supervision  Sit to Stand: Contact Guard Assist  Stand to Sit: Contact Guard Assist  Transfers  Bed to Chair: Risk analyst  Chair to Bed: Research scientist (physical sciences)  Ambulation: Soil scientist (Feet):  (20 ft with FWW  )  Weight Bearing Observed:  (dec hip/knee flex, dec step length, dec cadencd)       Pt completed standing there ex with FWW for support for hip flex, hip abd/add and toe raises 3 x 10 in order to  increase B LE strength, increase transfers and increase gait.            Educated the patient to role of physical therapy, plan of care, goals of therapy and safety with mobility and ADLs, discharge instructions, home safety.  Patient left in bedside sitting EOB  With sitter in place and call bell within reach. RN notified of session outcome.      Assessment: Pt currently CGA/Min A with functional mobility and pt is currently fall risk. Pt currently is aphasic presents with impulsivity. Pt would benefit from acute rehab to address deficits in order to return to least restrictive environment.          Plan: Treatment/Interventions: Exercise, Gait training, Functional transfer training, LE strengthening/ROM, Endurance training, Bed mobility, Patient/family training  PT Frequency: 3-4x/wk   Continue plan of care.    Goals: Goals  Goal Formulation: With patient  Time for Goal Acheivement: By time of discharge  Goals: Select goal  Pt Will Go Supine To Sit: independent, Partly met  Pt Will Perform Sit to Stand: independent, Partly met  Pt Will Ambulate: 101-150 feet, with rolling walker, with supervision, Partly met  Pt Will Go Up / Down Stairs: 1-2 stairs, with supervision, With rail, Not met        DME Recommended for Discharge:  (already has FWW)  Discharge Recommendation: Acute Rehab     Dois Davenport, PT, MSPT   [phone]  05/11/2014 2:20 PM

## 2014-05-11 NOTE — Progress Notes (Signed)
RENAL PROGRESS NOTE    Date Time: 05/11/2014 4:39 PM  Patient Name: [name]  Attending Physician: Laveda Norman, MD    Assessment:   Acute kidney injury required dialysis. Off dialysis.  Renal function remains stable, likely close to baseline. No labs today.  Chronic kidney disease, likely related to hypertension, vascular disease, and diabetes. Baseline is unclear.  Hypertension,  Fair control.  Anemia of chronic kidney disease, stable.  Acute stroke  Diabetes,   Secondary hyperparathyroidism of kidney disease  Plan:   Femoral catheter to be discontinued.  Encourage oral hydration. Monitor the intake/ urine out put.  Follow the renal function, electrolytes and fluid status.  Follow the blood pressure and adjust the medication as required, especially in view of recent stroke.  Continue with calcitriol.   Meds in renal dose and avoid nephrotoxic.  Pt needs out pt nephrology follow up for CKD.  D/W RN.  Subjective:   Pt is seen and examined at bedside.  Alert and awake. In no distress.    Review of Systems:   Aphasic. But did not offer any complaints.    Meds:     Current Facility-Administered Medications   Medication Dose Route Frequency   . albuterol-ipratropium  3 mL Nebulization Q6H SCH   . amLODIPine  10 mg Oral Daily   . atorvastatin  40 mg Oral QHS   . calcitRIOL  0.25 mcg Oral Daily   . cloNIDine  0.2 mg Oral BID   . clopidogrel  75 mg Oral Daily   . darbepoetin alfa (ARANESP) injection  40 mcg Subcutaneous Weekly   . docusate sodium  100 mg Oral Daily   . fluticasone-salmeterol  1 puff Inhalation BID   . heparin (porcine)  5,000 Units Subcutaneous Q12H [facility]   . hydrALAZINE  100 mg Oral Q8H SCH   . isosorbide mononitrate  60 mg Oral Daily   . terazosin  5 mg Oral QHS   . venlafaxine  25 mg Oral BID       Physical Exam:   BP 148/58 mmHg  Pulse 84  Temp(Src) 97.2 F (36.2 C) (Oral)  Resp 18  Ht 1.549 m (5\' 1" )  Wt 66.679 kg (147 lb)  BMI 27.79 kg/m2  SpO2 95%    Intake/Output Summary (Last 24 hours)  at 05/11/14 1639  Last data filed at 05/11/14 0600   Gross per 24 hour   Intake 1327.17 ml   Output    300 ml   Net 1027.17 ml       General: awake, no acute distress.  No JVD  Cardiovascular: S1S2,    Lungs: Air entry bilaterally  Abdomen: soft, non-tender, non-distended,   Extremities: no clubbing, cyanosis, or edema    Labs:     No results for input(s): WBC, HGB, HCT, PLT, MCV in the last 72 hours.  Recent Labs      05/10/14   0450  05/09/14   0425   SODIUM  139  139   POTASSIUM  3.6  3.7   CHLORIDE  109  109   CO2  24  23   BUN  6.0*  6.0*   CREATININE  1.4*  1.4*   GLUCOSE  156*  141*   CALCIUM  7.8*  8.0*   PHOSPHORUS  2.2*   --      No results for input(s): AST, ALT, ALKPHOS, PROT, ALB in the last 72 hours.  No results for input(s): PTT, PT, INR in the  last 72 hours.                Recent Labs  Lab 05/05/14  1457   CHOLESTEROL 185   TRIGLYCERIDES 124   HDL 53   CALCULATED LDL 107*     Radiology Results (24 Hour)     ** No results found for the last 24 hours. Mosie Lukes, MD  [phone]

## 2014-05-11 NOTE — Plan of Care (Addendum)
Problem: Safety  Goal: Patient will be free from injury during hospitalization  Outcome: Progressing  Pt able to turn self in bed, and is reported to have poor safety awareness as evidenced by her getting out of bed quickly to use the bathroom.      Problem: Hemodynamic Status: Cardiac  Goal: Stable vital signs and fluid balance  Outcome: Progressing  VSS, will continue to monitor    Problem: Day 4- Stroke  Goal: Neurovascular Status is Stable or Improving  Outcome: Progressing  Pt still nonverbal, only nods her head to communicate.  Physical therapy seen today.  No new neuro deficits noted, will continue to monitor    Comments:   The patient's learning abilities have been assessed. Today's individualized plan of care to continue hourly rounding, pain management, neuro checks, discontinue femoral catheter, monitor I&Os, wound care, and IV fluids was discussed with the patient and agree to it. Patient demonstrates understanding of disease process, treatment plan, medications and consequences of noncompliance. All questions and concerns were addressed.

## 2014-05-12 LAB — GLUCOSE WHOLE BLOOD - POCT
Whole Blood Glucose POCT: 147 mg/dL — ABNORMAL HIGH (ref 70–100)
Whole Blood Glucose POCT: 183 mg/dL — ABNORMAL HIGH (ref 70–100)
Whole Blood Glucose POCT: 188 mg/dL — ABNORMAL HIGH (ref 70–100)
Whole Blood Glucose POCT: 177 mg/dL — ABNORMAL HIGH (ref 70–100)

## 2014-05-12 NOTE — Plan of Care (Signed)
Problem: Safety  Goal: Patient will be free from injury during hospitalization  Outcome: Progressing  Pt calm this AM, still nonverbal, sitter at bedside for safety.    Problem: Day 4- Stroke  Goal: Neurovascular Status is Stable or Improving  Outcome: Progressing  Neuro status stable, no new deficits noted, will continue to monitor    Comments:   The patient's learning abilities have been assessed. Today's individualized plan of care to continue hourly rounding, continue sitter observation, pain management, monitor neuro status, and NPO after midnight tonight for possible TEE was discussed with the patient and she agreed to it. Patient needs reinforcement of disease process, treatment plan, medications and consequences of noncompliance. All questions and concerns were addressed. Pt unable to teach back, due to being nonverbal, can only currently nod in consent or dissent

## 2014-05-12 NOTE — Plan of Care (Signed)
Problem: Renal Failure  Goal: Fluid and electrolyte balance are achieved/maintained  Outcome: Progressing  Intervention: Assess and reassess fluid and electrolyte status  Will follow renal status, ivf maintained, assess  Oral hydration.      Problem: Neurological Deficit  Goal: Neurological status is stable or improving  Outcome: Progressing  The patient's learning abilities have been assessed. Today's individualized plan of care to monitor for any neurological changes ...Marland KitchenMarland KitchenMarland Kitchenwas discussed with the patient  agrees to it. Patient partly demonstrates understanding of disease process, treatment plan, medications and consequences of noncompliance.

## 2014-05-12 NOTE — Progress Notes (Signed)
RENAL PROGRESS NOTE    Date Time: 05/12/2014 5:45 PM  Patient Name: [name]  Attending Physician: Laveda Norman, MD    Assessment:   Acute kidney injury required dialysis. Off dialysis.  Renal function remains stable, likely close to baseline. No labs today.  Chronic kidney disease, likely related to hypertension, vascular disease, and diabetes. Baseline is unclear.  Hypertension,  Fair control.  Anemia of chronic kidney disease, stable.  Acute stroke  Diabetes,   Secondary hyperparathyroidism of kidney disease  Plan:   Femoral catheter has been discontinued.  Encourage oral hydration. Monitor the intake/ urine out put.  Discontinue IV fluid.  Monitor the renal function, electrolytes and fluid status.  Monitor the blood pressure and adjust the medication as required, especially in view of recent stroke.  Continue with calcitriol.   Meds in renal dose and avoid nephrotoxic.  Pt needs out pt nephrology follow up for CKD.  D/W RN.  Subjective:   Pt is seen and examined at bedside. He is alert and oriented in no distress.    Review of Systems:   Aphasic. But did not offer any complaints.    Meds:     Current Facility-Administered Medications   Medication Dose Route Frequency   . albuterol-ipratropium  3 mL Nebulization Q6H SCH   . amLODIPine  10 mg Oral Daily   . atorvastatin  40 mg Oral QHS   . calcitRIOL  0.25 mcg Oral Daily   . cloNIDine  0.2 mg Oral BID   . clopidogrel  75 mg Oral Daily   . darbepoetin alfa (ARANESP) injection  40 mcg Subcutaneous Weekly   . docusate sodium  100 mg Oral Daily   . fluticasone-salmeterol  1 puff Inhalation BID   . heparin (porcine)  5,000 Units Subcutaneous Q12H [facility]   . hydrALAZINE  100 mg Oral Q8H SCH   . isosorbide mononitrate  60 mg Oral Daily   . terazosin  5 mg Oral QHS   . venlafaxine  25 mg Oral BID       Physical Exam:   BP 148/58 mmHg  Pulse 84  Temp(Src) 97.2 F (36.2 C) (Oral)  Resp 18  Ht 1.549 m (5\' 1" )  Wt 66.679 kg (147 lb)  BMI 27.79 kg/m2  SpO2 95%  No  intake or output data in the 24 hours ending 05/12/14 1745    General: awake, no acute distress.  No JVD  Cardiovascular: S1S2,    Lungs: Air entry bilaterally  Abdomen: soft, non-tender, non-distended,   Extremities: no clubbing, cyanosis, or edema    Labs:     No results for input(s): WBC, HGB, HCT, PLT, MCV in the last 72 hours.  Recent Labs      05/10/14   0450   SODIUM  139   POTASSIUM  3.6   CHLORIDE  109   CO2  24   BUN  6.0*   CREATININE  1.4*   GLUCOSE  156*   CALCIUM  7.8*   PHOSPHORUS  2.2*     No results for input(s): AST, ALT, ALKPHOS, PROT, ALB in the last 72 hours.  No results for input(s): PTT, PT, INR in the last 72 hours.                  Radiology Results (24 Hour)     ** No results found for the last 24 hours. **          Mosie Lukes, MD  [phone]  [phone]

## 2014-05-13 ENCOUNTER — Inpatient Hospital Stay: Payer: Medicare Other

## 2014-05-13 ENCOUNTER — Inpatient Hospital Stay: Payer: Medicare Other | Admitting: Anesthesiology

## 2014-05-13 ENCOUNTER — Other Ambulatory Visit: Payer: BLUE CROSS/BLUE SHIELD

## 2014-05-13 DIAGNOSIS — I6310 Cerebral infarction due to embolism of unspecified precerebral artery: Secondary | ICD-10-CM | POA: Insufficient documentation

## 2014-05-13 LAB — BASIC METABOLIC PANEL
Anion Gap: 8 (ref 5.0–15.0)
BUN: 9 mg/dL (ref 7.0–19.0)
CO2: 22 mEq/L (ref 22–29)
Calcium: 8.3 mg/dL — ABNORMAL LOW (ref 8.5–10.5)
Chloride: 110 mEq/L (ref 100–111)
Creatinine: 1.4 mg/dL — ABNORMAL HIGH (ref 0.6–1.0)
Glucose: 146 mg/dL — ABNORMAL HIGH (ref 70–100)
Potassium: 4.2 mEq/L (ref 3.5–5.1)
Sodium: 140 mEq/L (ref 136–145)

## 2014-05-13 LAB — HEMOLYSIS INDEX: Hemolysis Index: 8 (ref 0–18)

## 2014-05-13 LAB — GLUCOSE WHOLE BLOOD - POCT
Whole Blood Glucose POCT: 150 mg/dL — ABNORMAL HIGH (ref 70–100)
Whole Blood Glucose POCT: 137 mg/dL — ABNORMAL HIGH (ref 70–100)
Whole Blood Glucose POCT: 139 mg/dL — ABNORMAL HIGH (ref 70–100)

## 2014-05-13 LAB — GFR: EGFR: 45.7

## 2014-05-13 MED ORDER — PROPOFOL INFUSION 10 MG/ML
INTRAVENOUS | Status: DC | PRN
Start: 2014-05-13 — End: 2014-05-13
  Administered 2014-05-13 (×10): 30 mg via INTRAVENOUS

## 2014-05-13 MED ORDER — PROPOFOL 10 MG/ML IV EMUL
INTRAVENOUS | Status: AC
Start: 2014-05-13 — End: ?
  Filled 2014-05-13: qty 20

## 2014-05-13 MED ORDER — LIDOCAINE HCL (PF) 2 % IJ SOLN
INTRAMUSCULAR | Status: AC
Start: 2014-05-13 — End: ?
  Filled 2014-05-13: qty 5

## 2014-05-13 MED ORDER — SODIUM CHLORIDE 0.9 % IV SOLN
INTRAVENOUS | Status: DC | PRN
Start: 2014-05-13 — End: 2014-05-13

## 2014-05-13 MED ORDER — LIDOCAINE HCL 2 % IJ SOLN
INTRAMUSCULAR | Status: DC | PRN
Start: 2014-05-13 — End: 2014-05-13
  Administered 2014-05-13: 100 mg

## 2014-05-13 NOTE — Progress Notes (Signed)
Problem: Renal Failure  Goal: Fluid and electrolyte balance are achieved/maintained  Outcome: Progressing  Intervention: Assess and reassess fluid and electrolyte status  Will follow renal status, ivf dcd during the day, assess Oral hydration.      Problem: Neurological Deficit  Goal: Neurological status is stable or improving  Outcome: Progressing  The patient's learning abilities have been assessed. Today's individualized plan of care to monitor for any neurological changes /no changes thus far, will have TEE today...Marland KitchenMarland KitchenMarland Kitchenwas discussed with the patient agrees to it. Patient partly demonstrates understanding of disease process, treatment plan, medications and consequences of noncompliance.

## 2014-05-13 NOTE — Progress Notes (Signed)
TEE with Dr. Katrinka Blazing  Anesthesia here for sedation  Scope passed at 1155 and removed at1205  Taken to CVIR Recovery   Vital signs and O2 sat stable at this time remains on 2L oxygen  Remains sleepy at this time  Report to A. Stark Jock.

## 2014-05-13 NOTE — Progress Notes (Signed)
PROGRESS NOTE  [facility]  [address], [address]  [place], [place] [address]  [phone]  Fax [phone]    Date Time: 05/13/2014 4:02 PM  Patient Name: [name], [name]      Assessment:   Acute stroke  Left atrial appendage thrombus  Bradycardia , resolved, now SR  HTN   Coronary artery disease  - CABG 2015  CKD  DM  Moderate MR  Moderate TR      Problem List:     Active Hospital Problems    Diagnosis   . Cerebrovascular accident (CVA) due to embolism of precerebral artery   . Coronary artery disease involving native coronary artery, angina presence unspecified, unspecified whether native or transplanted heart   . Renal failure   . Bradycardia   . Acute on chronic renal failure   . Encephalopathy   . Azotemia   . CAD (coronary artery disease)   . Chronic obstructive pulmonary disease, unspecified COPD type        Plan:   Will need to coordinate with neurology when ok to start anticoagulation  Continue current anti htn rx    Subjective:   aphasic    Medications:     Current Facility-Administered Medications   Medication Dose Route Frequency Last Rate Last Dose   . acetaminophen (TYLENOL) tablet 650 mg  650 mg Oral Q4H PRN   650 mg at 04/30/14 2300    Or   . acetaminophen (TYLENOL) suppository 650 mg  650 mg Rectal Q4H PRN       . acetaminophen (TYLENOL) tablet 650 mg  650 mg Oral Q4H PRN   650 mg at 04/29/14 0420   . albuterol-ipratropium (DUO-NEB) 2.5-0.5(3) mg/3 mL nebulizer 3 mL  3 mL Nebulization Q6H SCH   3 mL at 05/10/14 0754   . ALPRAZolam (XANAX) tablet 0.25 mg  0.25 mg Oral Q6H PRN   0.25 mg at 05/03/14 1845   . amLODIPine (NORVASC) tablet 10 mg  10 mg Oral Daily   10 mg at 05/13/14 1457   . atorvastatin (LIPITOR) tablet 40 mg  40 mg Oral QHS   40 mg at 05/12/14 2234   . atropine injection 0.5 mg  0.5 mg Intravenous PRN       . bisacodyl (DULCOLAX) suppository 10 mg  10 mg Rectal QD PRN       . calcitRIOL (ROCALTROL) capsule 0.25 mcg  0.25 mcg Oral Daily   0.25 mcg at 05/13/14  1500   . cloNIDine (CATAPRES) tablet 0.2 mg  0.2 mg Oral BID   Stopped at 05/13/14 1000   . clopidogrel (PLAVIX) tablet 75 mg  75 mg Oral Daily   75 mg at 05/13/14 1500   . darbepoetin alfa (ARANESP) injection 40 mcg  40 mcg Subcutaneous Weekly   40 mcg at 05/08/14 1749   . dextrose 50 % bolus 25 mL  25 mL Intravenous PRN       . dextrose 50 % bolus 25 mL  25 mL Intravenous PRN       . dextrose 50 % bolus 25 mL  25 mL Intravenous PRN       . docusate sodium (COLACE) capsule 100 mg  100 mg Oral Daily   100 mg at 05/13/14 1500   . fentaNYL (SUBLIMAZE) injection 12.5 mcg  12.5 mcg Intravenous Q4H PRN   12.5 mcg at 04/30/14 2120   . fluticasone-salmeterol (ADVAIR DISKUS) 250-50 MCG/DOSE 1 puff  1 puff Inhalation BID  1 puff at 05/13/14 0810   . glucagon (rDNA) (GLUCAGEN) injection 1 mg  1 mg Intramuscular PRN       . glucagon (rDNA) (GLUCAGEN) injection 1 mg  1 mg Intramuscular PRN       . glucagon (rDNA) (GLUCAGEN) injection 1 mg  1 mg Intramuscular PRN       . heparin (porcine) injection 5,000 Units  5,000 Units Subcutaneous Q12H SCH   5,000 Units at 05/12/14 2234   . hydrALAZINE (APRESOLINE) injection 10 mg  10 mg Intravenous Q6H PRN   10 mg at 05/03/14 0825   . hydrALAZINE (APRESOLINE) tablet 100 mg  100 mg Oral Q8H SCH   100 mg at 05/13/14 1300   . insulin aspart (NovoLOG) injection 1-3 Units  1-3 Units Subcutaneous QHS PRN   1 Units at 05/11/14 2256   . insulin aspart (NovoLOG) injection 1-5 Units  1-5 Units Subcutaneous PRN       . insulin aspart (NovoLOG) injection 1-5 Units  1-5 Units Subcutaneous TID AC PRN   2 Units at 05/10/14 1725   . isosorbide mononitrate (IMDUR) 24 hr tablet 60 mg  60 mg Oral Daily   Stopped at 05/13/14 1000   . naloxone (NARCAN) injection 0.2 mg  0.2 mg Intravenous PRN       . polyethylene glycol (MIRALAX) packet 17 g  17 g Oral QD PRN       . terazosin (HYTRIN) capsule 5 mg  5 mg Oral QHS   5 mg at 05/12/14 2234   . venlafaxine [facility]) tablet 25 mg  25 mg Oral BID   25 mg at  05/13/14 1500   . zolpidem (AMBIEN) tablet 5 mg  5 mg Oral QHS PRN   5 mg at 05/04/14 2211        Physical Exam:     Filed Vitals:    05/13/14 1502   BP: 167/68   Pulse: 76   Temp: 97.8 F (36.6 C)   Resp: 16   SpO2: 95%       Intake and Output Summary (Last 24 hours) at Date Time  No intake or output data in the 24 hours ending 05/13/14 1602    CV:rr  Lungs:cta  Abd:s  Ext:no edema    Labs:     Results     Procedure Component Value Units Date/Time    Glucose Whole Blood - POCT [[id number]]  (Abnormal) Collected:  05/13/14 1513     POCT - Glucose Whole blood 137 (H) mg/dL Updated:  40/98/11 9147    GFR [[id number]] Collected:  05/13/14 1000     EGFR 45.7 Updated:  05/13/14 1025    Basic Metabolic Panel [[id number]]  (Abnormal) Collected:  05/13/14 1000    Specimen Information:  Blood Updated:  05/13/14 1025     Glucose 146 (H) mg/dL      BUN 9.0 mg/dL      Creatinine 1.4 (H) mg/dL      CALCIUM 8.3 (L) mg/dL      Sodium 295 mEq/L      Potassium 4.2 mEq/L      Chloride 110 mEq/L      CO2 22 mEq/L      Anion Gap 8.0     Hemolysis index [[id number]] Collected:  05/13/14 1000     Hemolysis Index 8 Updated:  05/13/14 1025    Glucose Whole Blood - POCT [[id number]]  (Abnormal) Collected:  05/13/14 0754     POCT - Glucose Whole blood  150 (H) mg/dL Updated:  98/11/91 4782    Glucose Whole Blood - POCT [[id number]]  (Abnormal) Collected:  05/12/14 2232     POCT - Glucose Whole blood 177 (H) mg/dL Updated:  57/84/69 6295    Glucose Whole Blood - POCT [[id number]]  (Abnormal) Collected:  05/12/14 1608     POCT - Glucose Whole blood 188 (H) mg/dL Updated:  01/12/24 3664              Signed by: Brunetta Genera, MD  Interventional Cardiology  [facility]  Office: [phone]  [facility] Spectral Link: [phone]

## 2014-05-13 NOTE — Progress Notes (Signed)
Pt arrived in holding area.  Pt sleepy. No signs of distress noted.  Vital signs stable.

## 2014-05-13 NOTE — Transfer of Care (Signed)
Anesthesia Transfer of Care Note    Patient: [name]    Procedures performed: TEE    Anesthesia type: General TIVA    Patient location:PACU    Last vitals:   Filed Vitals:    05/13/14 1215   BP: 135/68   Pulse: 67   Temp: 36.5 C (97.7 F)   Resp: 10   SpO2: 96%       Post pain: Patient not complaining of pain, continue current therapy      Mental Status:awake    Respiratory Function: 2l  Cardiovascular: stable    Nausea/Vomiting: patient not complaining of nausea or vomiting    Hydration Status: adequate    Post assessment: no apparent anesthetic complications, no reportable events and no evidence of recall

## 2014-05-13 NOTE — Progress Notes (Signed)
[facility] Progress Note    Date Time: 05/13/2014 9:54 AM  Patient Name: [name],[name]    CC: R side weakness    Assessment:   Left MCA and ACA distribution ischemia in a 64 y/o female with multiple risk factors including HTN, DM, PVD. Improving neurologically. Possible etiology includes embolic vs thrombotic vs hypoperfusion in setting of bradycardia.      Patient Active Problem List   Diagnosis   . NSTEMI (non-ST elevated myocardial infarction)   . Hypertensive urgency   . PAD (peripheral artery disease)   . Arterial leg ulcer   . Chronic obstructive pulmonary disease, unspecified COPD type   . Ischemic cardiomyopathy   . CAD (coronary artery disease)   . Diabetes   . Chronic kidney disease   . Debilitated   . Complete heart block   . Junctional bradycardia   . Hyperkalemia   . Renal failure   . Bradycardia   . Acute on chronic renal failure   . Encephalopathy   . Azotemia   . Coronary artery disease involving native coronary artery, angina presence unspecified, unspecified whether native or transplanted heart       Plan:   -Continue Plavix for now and statin with LDL goal <70  -For TEE this AM. If negative, might consider ICM/LINQ to evaluate for pAF   -PT/OT/SLP >> dispo   -Further recommendations pending above results      Interval History/Subjective:   No acute events documented or reported overnight  Severe expressive aphasia persists - remains mute. Also with persistent mild right sided weakness. No alleviating factors       Medications:   Reviewed    Current Facility-Administered Medications   Medication Dose Route Frequency   . albuterol-ipratropium  3 mL Nebulization Q6H SCH   . amLODIPine  10 mg Oral Daily   . atorvastatin  40 mg Oral QHS   . calcitRIOL  0.25 mcg Oral Daily   . cloNIDine  0.2 mg Oral BID   . clopidogrel  75 mg Oral Daily   . darbepoetin alfa (ARANESP) injection  40 mcg Subcutaneous Weekly   . docusate sodium  100 mg Oral Daily   . fluticasone-salmeterol  1 puff Inhalation BID   .  heparin (porcine)  5,000 Units Subcutaneous Q12H [facility]   . hydrALAZINE  100 mg Oral Q8H SCH   . isosorbide mononitrate  60 mg Oral Daily   . terazosin  5 mg Oral QHS   . venlafaxine  25 mg Oral BID       Review of Systems:   Unable to complete 2/2 patient factors - aphasia/mute      Physical Exam:   Temp:  [97.6 F (36.4 C)-98.7 F (37.1 C)] 97.9 F (36.6 C)  Heart Rate:  [75-81] 77  Resp Rate:  [16-18] 17  BP: (141-168)/(48-72) 152/65 mmHg    Vital Signs: Reviewed. Stable as noted above.     General: The patient is a well developed female. Cooperative with exam. Sitting up in bed.   Extremities: no pedal edema, extremities normal in color; right foot wrapped in dressing    Mental Status: The patient is awake and alert. Nods yes/no appropriately. Mute. She is able to follow commands appropriately. Normal affect.     Cranial nerves:   -CN III, IV, VI: Pupils equal, round, and reactive to light; extraocular movements intact; no ptosis              -CN VII: Right facial  weakness  -CN VIII: Hearing intact to conversational speech   -CN XII: Tongue deviates to the right    Motor: No atrophy. She is 4/5 RUE, 4+/5 RLE. Mild right pronator drift. She is full strength on the left.     Reflexes: DTR's 1-2+, flexor plantars    Coordination: No tremors. No truncal ataxia    Gait: NT 2/2 safety concerns.     Labs:     Results     Procedure Component Value Units Date/Time    Glucose Whole Blood - POCT [[id number]]  (Abnormal) Collected:  05/11/14 0846     POCT - Glucose Whole blood 140 (H) mg/dL Updated:  62/95/28 4132       Rads:   No interval neuro imaging.     Signed by:   Katheren Shams, FNP-C  Nurse Practitioner  [facility] [facility]  Daytime: [id number]  Consult requests: [phone]  After 5:00 pm: [phone]      Patient seen and examined. Agree with above.  No new events.  Aphasia persists, but she understands speech well.  Mild right HP.  Await TEE results.    [name] B. Lance Coon, MD  [facility]  Medical Director of Neuroscience  and Stroke  [facility]  [phone]  [phone]

## 2014-05-13 NOTE — Progress Notes (Signed)
PROGRESS NOTE    Date Time: 05/13/2014 2:03 PM  Patient Name: [name],[name]      Subjective:   Bun 6  crt 1.4Wbc 5.9  hct 29    Temp 98  SBP 162    MRI [name] MCA stroke    Carotid neg    Alert RUE weakness improved    Expressive aphasia     Review of Systems:   A comprehensive review of systems was: General ROS: negative for - chills, fever or night sweats  Psychological ROS: negative for - anxiety, depression, disorientation, hallucinations or suicidal ideation  ENT ROS: negative for - headaches, nasal congestion, sinus pain or visual changes  Allergy and Immunology ROS: negative for - itchy/watery eyes, nasal congestion or postnasal drip  Hematological and Lymphatic ROS: negative for - bleeding problems, bruising, pallor or weight loss  Endocrine ROS: negative for - malaise/lethargy or polydipsia/polyuria  Respiratory ROS: negative for - cough, orthopnea, shortness of breath or wheezing  Cardiovascular ROS: negative for - chest pain, dyspnea on exertion, orthopnea or shortness of breath  Gastrointestinal ROS: negative for - abdominal pain, constipation, diarrhea or nausea/vomiting  Genito-Urinary ROS: negative for - change in urinary stream, dysuria, hematuria or nocturia  Musculoskeletal ROS: negative for - joint pain, joint stiffness or joint swelling  Neurological ROS: negative for - confusion, dizziness, headaches, memory loss or speech problems  Dermatological ROS: negative for pruritus, rash and skin lesion changes    Physical Exam:     Filed Vitals:    05/13/14 1315   BP:    Pulse: 72   Temp:    Resp: 15   SpO2: 97%       General appearance -awake  Mental status -awake expressive aphasia   Eyes - pupils equal and reactive, extraocular eye movements intact  Nose - normal and patent, no erythema, discharge or polyps  Mouth - mucous membranes moist, pharynx normal without lesions  Neck - supple, no significant adenopathy  Lymphatics - no palpable lymphadenopathy, no hepatosplenomegaly  Chest - clear to  auscultation, no wheezes, rales or rhonchi, symmetric air entry  Heart - normal rate, regular rhythm, normal S1, S2, no murmurs, rubs, clicks or gallops  Abdomen - soft, nontender, nondistended, no masses or organomegaly  Neurological - awake expressive aphasia RUE weakness  Musculoskeletal - no joint tenderness, deformity or swelling  Extremities - peripheral pulses normal, no pedal edema, no clubbing or cyanosis  Skin - normal coloration and turgor, no rashes, no suspicious skin lesions noted    Medications:     Current Facility-Administered Medications   Medication Dose Route Frequency   . albuterol-ipratropium  3 mL Nebulization Q6H [facility]   . amLODIPine  10 mg Oral Daily   . atorvastatin  40 mg Oral QHS   . calcitRIOL  0.25 mcg Oral Daily   . cloNIDine  0.2 mg Oral BID   . clopidogrel  75 mg Oral Daily   . darbepoetin alfa (ARANESP) injection  40 mcg Subcutaneous Weekly   . docusate sodium  100 mg Oral Daily   . fluticasone-salmeterol  1 puff Inhalation BID   . heparin (porcine)  5,000 Units Subcutaneous Q12H [facility]   . hydrALAZINE  100 mg Oral Q8H SCH   . isosorbide mononitrate  60 mg Oral Daily   . terazosin  5 mg Oral QHS   . venlafaxine  25 mg Oral BID       Intake and Output Summary (Last 24 hours) at Date Time  No intake or output data in the 24 hours ending 05/13/14 1403        Labs:       Recent Labs  Lab 05/13/14  1000 05/10/14  0450 05/09/14  0425   SODIUM 140 139 139   POTASSIUM 4.2 3.6 3.7   CHLORIDE 110 109 109   CO2 22 24 23    BUN 9.0 6.0* 6.0*   CREATININE 1.4* 1.4* 1.4*   CALCIUM 8.3* 7.8* 8.0*   GLUCOSE 146* 156* 141*                   Rads:     Radiology Results (24 Hour)     ** No results found for the last 24 hours. **            Assessment:     CVA R HP aphasia  Renal failure  Hypertension  UTI   Left atrial appendage thrombus      Plan:   Bradycardia resolved      CVA RHP   Aphasia  Carotid neg MRI [name] MCA distribution  On plavix 75 mg      TEE EF 50  Diffuse hypokinesis   Small thrombus in [name]  atrial appendage    Hx CAD    Hx cardiomyopathy  Hx aortic stenosis     Acute on chronic crt stable    Encephalopathy  metablolic   resolved      DM SSI monitor glucose      R/O sepsis  Hypothermic resolved  Evaluated by ID  UC bld cx on invanz   cx proteus   Treatment completed  COPD duoneb    Hypertension   Stable      HLD on statin goal LDL < 70       On anticoagulation per  Neurology and cardiology   Signed by: Madison Norman, MD  05/13/2014  2:03 PM

## 2014-05-13 NOTE — Plan of Care (Signed)
Problem: Neurological Deficit  Goal: Neurological status is stable or improving  Outcome: Progressing  Problem: Renal Failure  Goal: Fluid and electrolyte balance are achieved/maintained  Outcome: Progressing  Intervention: Assess and reassess fluid and electrolyte status  Will follow renal status, ivf dcd during the day, assess Oral hydration.      Problem: Neurological Deficit  Goal: Neurological status is stable or improving  Outcome: Progressing  The patient's learning abilities have been assessed. Today's individualized plan of care to monitor for any neurological changes, none thus far, will have TEE this am ...Marland KitchenMarland KitchenMarland Kitchenwas discussed with the patient agrees to it. Patient partly demonstrates understanding of disease process, treatment plan, medications and consequences of noncompliance.

## 2014-05-13 NOTE — Progress Notes (Signed)
PROGRESS NOTE    Date Time: 05/13/2014 2:02 PM  Patient Name: [name], [name]      Subjective:   Bun 6  crt 1.4Wbc 5.9  hct 29    Temp 98  SBP 162    MRI [name] MCA stroke    Carotid neg    Alert RUE weakness improved    Expressive aphasia     Review of Systems:   A comprehensive review of systems was: General ROS: negative for - chills, fever or night sweats  Psychological ROS: negative for - anxiety, depression, disorientation, hallucinations or suicidal ideation  ENT ROS: negative for - headaches, nasal congestion, sinus pain or visual changes  Allergy and Immunology ROS: negative for - itchy/watery eyes, nasal congestion or postnasal drip  Hematological and Lymphatic ROS: negative for - bleeding problems, bruising, pallor or weight loss  Endocrine ROS: negative for - malaise/lethargy or polydipsia/polyuria  Respiratory ROS: negative for - cough, orthopnea, shortness of breath or wheezing  Cardiovascular ROS: negative for - chest pain, dyspnea on exertion, orthopnea or shortness of breath  Gastrointestinal ROS: negative for - abdominal pain, constipation, diarrhea or nausea/vomiting  Genito-Urinary ROS: negative for - change in urinary stream, dysuria, hematuria or nocturia  Musculoskeletal ROS: negative for - joint pain, joint stiffness or joint swelling  Neurological ROS: negative for - confusion, dizziness, headaches, memory loss or speech problems  Dermatological ROS: negative for pruritus, rash and skin lesion changes    Physical Exam:     Filed Vitals:    05/13/14 1315   BP:    Pulse: 72   Temp:    Resp: 15   SpO2: 97%       General appearance -awake  Mental status -awake expressive aphasia   Eyes - pupils equal and reactive, extraocular eye movements intact  Nose - normal and patent, no erythema, discharge or polyps  Mouth - mucous membranes moist, pharynx normal without lesions  Neck - supple, no significant adenopathy  Lymphatics - no palpable lymphadenopathy, no hepatosplenomegaly  Chest - clear to  auscultation, no wheezes, rales or rhonchi, symmetric air entry  Heart - normal rate, regular rhythm, normal S1, S2, no murmurs, rubs, clicks or gallops  Abdomen - soft, nontender, nondistended, no masses or organomegaly  Neurological - awake expressive aphasia RUE weakness  Musculoskeletal - no joint tenderness, deformity or swelling  Extremities - peripheral pulses normal, no pedal edema, no clubbing or cyanosis  Skin - normal coloration and turgor, no rashes, no suspicious skin lesions noted    Medications:     Current Facility-Administered Medications   Medication Dose Route Frequency   . albuterol-ipratropium  3 mL Nebulization Q6H [facility]   . amLODIPine  10 mg Oral Daily   . atorvastatin  40 mg Oral QHS   . calcitRIOL  0.25 mcg Oral Daily   . cloNIDine  0.2 mg Oral BID   . clopidogrel  75 mg Oral Daily   . darbepoetin alfa (ARANESP) injection  40 mcg Subcutaneous Weekly   . docusate sodium  100 mg Oral Daily   . fluticasone-salmeterol  1 puff Inhalation BID   . heparin (porcine)  5,000 Units Subcutaneous Q12H [facility]   . hydrALAZINE  100 mg Oral Q8H [facility]   . isosorbide mononitrate  60 mg Oral Daily   . terazosin  5 mg Oral QHS   . venlafaxine  25 mg Oral BID       Intake and Output Summary (Last 24 hours) at Date Time  No intake or output data in the 24 hours ending 05/13/14 1402        Labs:       Recent Labs  Lab 05/13/14  1000 05/10/14  0450 05/09/14  0425   SODIUM 140 139 139   POTASSIUM 4.2 3.6 3.7   CHLORIDE 110 109 109   CO2 22 24 23    BUN 9.0 6.0* 6.0*   CREATININE 1.4* 1.4* 1.4*   CALCIUM 8.3* 7.8* 8.0*   GLUCOSE 146* 156* 141*                   Rads:     Radiology Results (24 Hour)     ** No results found for the last 24 hours. **            Assessment:     CVA R HP aphasia  Renal failure  Hypertension  UTI       Plan:   Bradycardia resolved      CVA RHP   Aphasia  Carotid neg MRI [name] MCA distribution  On plavix 75 mg      Await TEE    Hx CAD    Hx cardiomyopathy  Hx aortic stenosis     Acute on chronic crt  stable    Encephalopathy  metablolic   resolved      DM SSI monitor glucose      R/O sepsis  Hypothermic resolved  Evaluated by ID  UC bld cx on invanz   cx proteus   Treatment completed  COPD duoneb    Hypertension   Stable      HLD on statin goal LDL < 70   Signed by: Laveda Norman, MD  05/13/2014  2:02 PM

## 2014-05-13 NOTE — Plan of Care (Signed)
Problem: Neurological Deficit  Goal: Neurological status is stable or improving  Outcome: Progressing  The patient's learning and cognitive abilities were assessed and determined to be appropriate for understanding their plan of care during this shift. [name]'s individualized plan of care for this shift include: safety, comfort, monitor telemetry & VS, post TEE monitoring, and monitor for any neurological changes was discussed with [name] during bed side report at the beginning of the shift and she agreed to the plan. Patient and care giver demonstrates understanding of disease process, treatment plan, medications and consequences of noncompliance. All questions were answered and addressed.

## 2014-05-13 NOTE — Progress Notes (Signed)
RENAL PROGRESS NOTE    Date Time: 05/13/2014 3:50 PM  Patient Name: [name]  Attending Physician: Laveda Norman, MD    Assessment:   Acute kidney injury required dialysis. Off dialysis.  Renal function remains stable, likely close to baseline. No labs today.  Chronic kidney disease, likely related to hypertension, vascular disease, and diabetes. Baseline is unclear.  Hypertension,  Fair control.  Anemia of chronic kidney disease, stable.  Acute stroke  Diabetes,   Secondary hyperparathyroidism of kidney disease  Plan:   Has been off IV fluids. Encourage oral hydration. Monitor the intake/ urine out put.  Femoral catheter has been discontinued.  Follow the renal function, electrolytes and fluid status.  Monitor the blood pressure and adjust the medication as required, especially in view of recent stroke.  Stroke W/U in progress. Had TEE today.  Continue with calcitriol.   Meds in renal dose and avoid nephrotoxic.  Pt needs out pt nephrology follow up for CKD.  D/W RN.  Subjective:   Pt is seen and examined at bedside. She is S/P TEE and appears to be lethargic under medication effect.    Review of Systems:   Was not able to obtain.    Meds:     Current Facility-Administered Medications   Medication Dose Route Frequency   . albuterol-ipratropium  3 mL Nebulization Q6H [facility]   . amLODIPine  10 mg Oral Daily   . atorvastatin  40 mg Oral QHS   . calcitRIOL  0.25 mcg Oral Daily   . cloNIDine  0.2 mg Oral BID   . clopidogrel  75 mg Oral Daily   . darbepoetin alfa (ARANESP) injection  40 mcg Subcutaneous Weekly   . docusate sodium  100 mg Oral Daily   . fluticasone-salmeterol  1 puff Inhalation BID   . heparin (porcine)  5,000 Units Subcutaneous Q12H [facility]   . hydrALAZINE  100 mg Oral Q8H [facility]   . isosorbide mononitrate  60 mg Oral Daily   . terazosin  5 mg Oral QHS   . venlafaxine  25 mg Oral BID       Physical Exam:   BP 148/58 mmHg  Pulse 84  Temp(Src) 97.2 F (36.2 C) (Oral)  Resp 18  Ht 1.549 m (5\' 1" )  Wt  66.679 kg (147 lb)  BMI 27.79 kg/m2  SpO2 95%  No intake or output data in the 24 hours ending 05/13/14 1550    General: awake, no acute distress.  No JVD  Cardiovascular: S1S2,    Lungs: Air entry bilaterally  Abdomen: soft, non-tender, non-distended,   Extremities: no clubbing, cyanosis, or edema    Labs:     No results for input(s): WBC, HGB, HCT, PLT, MCV in the last 72 hours.  Recent Labs      05/13/14   1000   SODIUM  140   POTASSIUM  4.2   CHLORIDE  110   CO2  22   BUN  9.0   CREATININE  1.4*   GLUCOSE  146*   CALCIUM  8.3*     No results for input(s): AST, ALT, ALKPHOS, PROT, ALB in the last 72 hours.  No results for input(s): PTT, PT, INR in the last 72 hours.                  Radiology Results (24 Hour)     ** No results found for the last 24 hours. **  Remus Blake, MD  [phone]

## 2014-05-13 NOTE — Anesthesia Preprocedure Evaluation (Addendum)
Anesthesia Evaluation    AIRWAY    Mallampati: II    TM distance: >3 FB  Neck ROM: full  Mouth Opening:full   CARDIOVASCULAR    normal       DENTAL        (+) upper dentures and lower dentures   PULMONARY    pulmonary exam normal     OTHER FINDINGS              PSS Anesthesia Comments: DM, MI s/p CABG, Stroke with severe expressive aphasia, CKD, PVD, COPD        Anesthesia Plan    ASA 4     general                     intravenous induction   Detailed anesthesia plan: general IV        Post op pain management: per surgeon    informed consent obtained    Plan discussed with CRNA.      pertinent labs reviewed

## 2014-05-14 LAB — GLUCOSE WHOLE BLOOD - POCT
Whole Blood Glucose POCT: 118 mg/dL — ABNORMAL HIGH (ref 70–100)
Whole Blood Glucose POCT: 162 mg/dL — ABNORMAL HIGH (ref 70–100)
Whole Blood Glucose POCT: 152 mg/dL — ABNORMAL HIGH (ref 70–100)
Whole Blood Glucose POCT: 177 mg/dL — ABNORMAL HIGH (ref 70–100)

## 2014-05-14 MED ORDER — APIXABAN 5 MG PO TABS
5.0000 mg | ORAL_TABLET | Freq: Two times a day (BID) | ORAL | Status: DC
Start: 2014-05-14 — End: 2014-05-16
  Administered 2014-05-14 – 2014-05-16 (×5): 5 mg via ORAL
  Filled 2014-05-14 (×7): qty 1

## 2014-05-14 NOTE — Plan of Care (Addendum)
Problem: Day 4- Stroke  Goal: Neurovascular Status is Stable or Improving  Outcome: Progressing  The patient and care giver's learning abilities have been assessed. Today's individualized plan of care to monitor vitals, (HR), pain, accu-check, safety, was discussed with the patient and care giver  (sitter)and agreed to it. Patient and care giver demonstrates understanding of disease process, treatment plan, medications and consequences of noncompliance. All questions and concerns were addressed. Sitter placed for safety.    Notes  Pt is sinus rhythm, pulse in 70's, right sided weakness, OOB to rest room w/ one person assistance, SBP 145-170, accu-check done,  R/ foot wound care performed according to the order,  will monitor pt vitals.

## 2014-05-14 NOTE — Progress Notes (Signed)
PROGRESS NOTE  [facility]  [address]  [place], [place] [address]  [phone]  Fax [phone]    Date Time: 05/14/2014 12:51 PM  Patient Name: [name], [name]      Assessment:     Acute stroke  Left atrial appendage thrombus  Bradycardia , resolved, now SR  HTN   Coronary artery disease  - CABG 2015  CKD  DM  Moderate MR  Moderate TR    Problem List:     Active Hospital Problems    Diagnosis   . Cerebrovascular accident (CVA) due to embolism of precerebral artery   . Coronary artery disease involving native coronary artery, angina presence unspecified, unspecified whether native or transplanted heart   . Renal failure   . Bradycardia   . Acute on chronic renal failure   . Encephalopathy   . Azotemia   . CAD (coronary artery disease)   . Chronic obstructive pulmonary disease, unspecified COPD type        Plan:   Start eliquis bid  Continue current anti HTN medications  Continue statin      Subjective:   aphasic    Medications:     Current Facility-Administered Medications   Medication Dose Route Frequency Last Rate Last Dose   . acetaminophen (TYLENOL) tablet 650 mg  650 mg Oral Q4H PRN   650 mg at 04/30/14 2300    Or   . acetaminophen (TYLENOL) suppository 650 mg  650 mg Rectal Q4H PRN       . acetaminophen (TYLENOL) tablet 650 mg  650 mg Oral Q4H PRN   650 mg at 04/29/14 0420   . albuterol-ipratropium (DUO-NEB) 2.5-0.5(3) mg/3 mL nebulizer 3 mL  3 mL Nebulization Q6H SCH   3 mL at 05/14/14 0950   . ALPRAZolam (XANAX) tablet 0.25 mg  0.25 mg Oral Q6H PRN   0.25 mg at 05/03/14 1845   . amLODIPine (NORVASC) tablet 10 mg  10 mg Oral Daily   10 mg at 05/14/14 1015   . atorvastatin (LIPITOR) tablet 40 mg  40 mg Oral QHS   40 mg at 05/13/14 2215   . atropine injection 0.5 mg  0.5 mg Intravenous PRN       . bisacodyl (DULCOLAX) suppository 10 mg  10 mg Rectal QD PRN       . calcitRIOL (ROCALTROL) capsule 0.25 mcg  0.25 mcg Oral Daily   0.25 mcg at 05/14/14 1016   . cloNIDine  (CATAPRES) tablet 0.2 mg  0.2 mg Oral BID   0.2 mg at 05/14/14 1015   . clopidogrel (PLAVIX) tablet 75 mg  75 mg Oral Daily   75 mg at 05/14/14 1016   . darbepoetin alfa (ARANESP) injection 40 mcg  40 mcg Subcutaneous Weekly   40 mcg at 05/08/14 1749   . dextrose 50 % bolus 25 mL  25 mL Intravenous PRN       . dextrose 50 % bolus 25 mL  25 mL Intravenous PRN       . dextrose 50 % bolus 25 mL  25 mL Intravenous PRN       . docusate sodium (COLACE) capsule 100 mg  100 mg Oral Daily   100 mg at 05/14/14 1016   . fentaNYL (SUBLIMAZE) injection 12.5 mcg  12.5 mcg Intravenous Q4H PRN   12.5 mcg at 04/30/14 2120   . fluticasone-salmeterol (ADVAIR DISKUS) 250-50 MCG/DOSE 1 puff  1 puff Inhalation BID   1  puff at 05/14/14 0950   . glucagon (rDNA) (GLUCAGEN) injection 1 mg  1 mg Intramuscular PRN       . glucagon (rDNA) (GLUCAGEN) injection 1 mg  1 mg Intramuscular PRN       . glucagon (rDNA) (GLUCAGEN) injection 1 mg  1 mg Intramuscular PRN       . heparin (porcine) injection 5,000 Units  5,000 Units Subcutaneous Q12H SCH   5,000 Units at 05/14/14 1016   . hydrALAZINE (APRESOLINE) injection 10 mg  10 mg Intravenous Q6H PRN   10 mg at 05/03/14 0825   . hydrALAZINE (APRESOLINE) tablet 100 mg  100 mg Oral Q8H SCH   100 mg at 05/14/14 1015   . insulin aspart (NovoLOG) injection 1-3 Units  1-3 Units Subcutaneous QHS PRN   1 Units at 05/11/14 2256   . insulin aspart (NovoLOG) injection 1-5 Units  1-5 Units Subcutaneous PRN       . insulin aspart (NovoLOG) injection 1-5 Units  1-5 Units Subcutaneous TID AC PRN   2 Units at 05/10/14 1725   . isosorbide mononitrate (IMDUR) 24 hr tablet 60 mg  60 mg Oral Daily   60 mg at 05/14/14 1015   . naloxone [facility]) injection 0.2 mg  0.2 mg Intravenous PRN       . polyethylene glycol (MIRALAX) packet 17 g  17 g Oral QD PRN       . terazosin (HYTRIN) capsule 5 mg  5 mg Oral QHS   5 mg at 05/13/14 2215   . venlafaxine [facility]) tablet 25 mg  25 mg Oral BID   25 mg at 05/14/14 1016   . zolpidem  (AMBIEN) tablet 5 mg  5 mg Oral QHS PRN   5 mg at 05/04/14 2211            Physical Exam:     Filed Vitals:    05/14/14 0951   BP:    Pulse:    Temp:    Resp:    SpO2: 96%       Intake and Output Summary (Last 24 hours) at Date Time    Intake/Output Summary (Last 24 hours) at 05/14/14 1251  Last data filed at 05/13/14 2013   Gross per 24 hour   Intake     75 ml   Output      0 ml   Net     75 ml           CV:rr  Lungs:cta  Abd:s  Ext:no edema    Labs:     Results     Procedure Component Value Units Date/Time    Glucose Whole Blood - POCT [[id number]]  (Abnormal) Collected:  05/14/14 1242     POCT - Glucose Whole blood 162 (H) mg/dL Updated:  40/98/11 9147    Glucose Whole Blood - POCT [[id number]]  (Abnormal) Collected:  05/14/14 0709     POCT - Glucose Whole blood 118 (H) mg/dL Updated:  86/57/84 6962    Glucose Whole Blood - POCT [[id number]]  (Abnormal) Collected:  05/13/14 2148     POCT - Glucose Whole blood 139 (H) mg/dL Updated:  40/10/27 2536    Glucose Whole Blood - POCT [[id number]]  (Abnormal) Collected:  05/13/14 1513     POCT - Glucose Whole blood 137 (H) mg/dL Updated:  59/56/38 7564              Signed by: Brunetta Genera,  MD  Interventional Cardiology  [facility]  Office: [phone] Link: [phone]

## 2014-05-14 NOTE — Consults (Signed)
[facility] Neurology Progress Note    Date Time: 05/14/2014 9:01 AM  Patient Name: [name], [name]    CC: Right weakness      Assessment:   [name] has had ischemic left cerebal infarction probably embolic from the noted left atrial appendage thrombus. She is otherwise doing well.   Patient Active Problem List   Diagnosis   . NSTEMI (non-ST elevated myocardial infarction)   . Hypertensive urgency   . PAD (peripheral artery disease)   . Arterial leg ulcer   . Chronic obstructive pulmonary disease, unspecified COPD type   . Ischemic cardiomyopathy   . CAD (coronary artery disease)   . Diabetes   . Chronic kidney disease   . Debilitated   . Complete heart block   . Junctional bradycardia   . Hyperkalemia   . Renal failure   . Bradycardia   . Acute on chronic renal failure   . Encephalopathy   . Azotemia   . Coronary artery disease involving native coronary artery, angina presence unspecified, unspecified whether native or transplanted heart   . Cerebrovascular accident (CVA) due to embolism of precerebral artery       Plan:   -Current management and will need full anticoagulation due to the left thrombus of the atrial appendage noted on Echocardiogram. Currently on Heparin  -Continued neurotherapy and physical therapy.    Interval History/Subjective:   [name] was seen for a Neurological follow up on May 14, 2014. She is doing well with no specific complaint.     Of note is that her Echocardiogram did show a thrombus of the left atrial appendage.     Medications:     Current Facility-Administered Medications   Medication Dose Route Frequency   . albuterol-ipratropium  3 mL Nebulization Q6H SCH   . amLODIPine  10 mg Oral Daily   . atorvastatin  40 mg Oral QHS   . calcitRIOL  0.25 mcg Oral Daily   . cloNIDine  0.2 mg Oral BID   . clopidogrel  75 mg Oral Daily   . darbepoetin alfa (ARANESP) injection  40 mcg Subcutaneous Weekly   . docusate sodium  100 mg Oral Daily   . fluticasone-salmeterol  1 puff Inhalation BID   .  heparin (porcine)  5,000 Units Subcutaneous Q12H [facility]   . hydrALAZINE  100 mg Oral Q8H SCH   . isosorbide mononitrate  60 mg Oral Daily   . terazosin  5 mg Oral QHS   . venlafaxine  25 mg Oral BID       Review of Systems:   Neurological ROS:     Physical Exam:   Temp:  [97.7 F (36.5 C)-98.7 F (37.1 C)] 97.7 F (36.5 C)  Heart Rate:  [66-88] 88  Resp Rate:  [10-23] 18  BP: (135-180)/(61-93) 154/66 mmHg    Vital Signs:  Reviewed    General: The patient was well developed and well nourished.  No acute distress. Cooperative with the exam  Neck:  no carotid bruits  CVS: RRR, no murmurs, rubs,or gallops  Resp: CTA bilaterally, no retractions  Abd: Soft, nontender  Extremities: no pedal edema, extremities normal in color    Mental Status: The patient was awake, alert and oriented to person, place, and time.  Affect is normal  Fund of knowledge appropriate  Recent and remote memory are intact   Attention span and concentration appear normal.  Language function is normal. There is no evidence of aphasia in conversational speech.  Cranial nerves:   -CN II: Visual fields full to bedside confrontation   -CN III, IV, VI: Pupils equal, round, and reactive to light; extraocular movements intact; no ptosis              -CN V: Facial sensation intact in V1 through V3 distributions   -CN VII: Mild right facial we4akness.  -CN VIII: Hearing intact to conversational speech   -CN IX, X: Palate elevates symmetrically; normal phonation   -CN XI: Symmetric full strength of sternocleidomastoid and trapezius muscles   -CN XII: Tongue protrudes midline    Motor: No atrophy.  No fasiculations.   She has mild right 4+/5 right weakness.     Sensory:   Light touch intact.      Reflexes:  1+/4 and Plantars flexor.  Coordination: FTN intact taking into account the weakness.No tremors    Gait:Not tested.    Labs:     Results     Procedure Component Value Units Date/Time    Glucose Whole Blood - POCT [[id number]]  (Abnormal) Collected:  05/14/14  0709     POCT - Glucose Whole blood 118 (H) mg/dL Updated:  56/43/32 9518    Glucose Whole Blood - POCT [[id number]]  (Abnormal) Collected:  05/13/14 2148     POCT - Glucose Whole blood 139 (H) mg/dL Updated:  16/01/09 3235    Glucose Whole Blood - POCT [[id number]]  (Abnormal) Collected:  05/13/14 1513     POCT - Glucose Whole blood 137 (H) mg/dL Updated:  27/06/23 7628    GFR [[id number]] Collected:  05/13/14 1000     EGFR 45.7 Updated:  05/13/14 1025    Basic Metabolic Panel [[id number]]  (Abnormal) Collected:  05/13/14 1000    Specimen Information:  Blood Updated:  05/13/14 1025     Glucose 146 (H) mg/dL      BUN 9.0 mg/dL      Creatinine 1.4 (H) mg/dL      CALCIUM 8.3 ([name]) mg/dL      Sodium 485 mEq/[name]      Potassium 4.2 mEq/[name]      Chloride 110 mEq/[name]      CO2 22 mEq/[name]      Anion Gap 8.0     Hemolysis index [[id number]] Collected:  05/13/14 1000     Hemolysis Index 8 Updated:  05/13/14 1025          Rads:   No results found.      Signed by:  Jannet Mantis, M.D., [facility]  [facility] - Neurology Hospitalist

## 2014-05-14 NOTE — Plan of Care (Signed)
Problem: Neurological Deficit  Goal: Neurological status is stable or improving  Outcome: Progressing  The patient's learning abilities have been assessed. Today's individualized plan of care to do neuro checks, increase anticoagulation, education about new anticoagulation, was discussed with the patient and agree to it. Patient demonstrates understanding of disease process, treatment plan, medications and consequences of noncompliance. All questions and concerns were addressed.             Comments:   Patient remains aphasic. She is able to vocalize sounds but unable to form words. No complaints of pain. Will begin eliquis due to thrombus in L atrium. Patient education about new medication and reasons for anticoagulation. Will continue to monitor.

## 2014-05-14 NOTE — Progress Notes (Signed)
PROGRESS NOTE    Date Time: 05/14/2014 10:12 AM  Patient Name: [name], [name]      Subjective:   Bun 6  crt 1.4Wbc 5.9  hct 29    Temp 98  SBP 162    MRI [name] MCA stroke    Carotid neg    Alert RUE weakness improved    Expressive aphasia     Review of Systems:   A comprehensive review of systems was: General ROS: negative for - chills, fever or night sweats  Psychological ROS: negative for - anxiety, depression, disorientation, hallucinations or suicidal ideation  ENT ROS: negative for - headaches, nasal congestion, sinus pain or visual changes  Allergy and Immunology ROS: negative for - itchy/watery eyes, nasal congestion or postnasal drip  Hematological and Lymphatic ROS: negative for - bleeding problems, bruising, pallor or weight loss  Endocrine ROS: negative for - malaise/lethargy or polydipsia/polyuria  Respiratory ROS: negative for - cough, orthopnea, shortness of breath or wheezing  Cardiovascular ROS: negative for - chest pain, dyspnea on exertion, orthopnea or shortness of breath  Gastrointestinal ROS: negative for - abdominal pain, constipation, diarrhea or nausea/vomiting  Genito-Urinary ROS: negative for - change in urinary stream, dysuria, hematuria or nocturia  Musculoskeletal ROS: negative for - joint pain, joint stiffness or joint swelling  Neurological ROS: negative for - confusion, dizziness, headaches, memory loss or speech problems  Dermatological ROS: negative for pruritus, rash and skin lesion changes    Physical Exam:     Filed Vitals:    05/14/14 0951   BP:    Pulse:    Temp:    Resp:    SpO2: 96%       General appearance -awake  Mental status -awake expressive aphasia   Eyes - pupils equal and reactive, extraocular eye movements intact  Nose - normal and patent, no erythema, discharge or polyps  Mouth - mucous membranes moist, pharynx normal without lesions  Neck - supple, no significant adenopathy  Lymphatics - no palpable lymphadenopathy, no hepatosplenomegaly  Chest - clear to auscultation,  no wheezes, rales or rhonchi, symmetric air entry  Heart - normal rate, regular rhythm, normal S1, S2, no murmurs, rubs, clicks or gallops  Abdomen - soft, nontender, nondistended, no masses or organomegaly  Neurological - awake expressive aphasia RUE weakness  Musculoskeletal - no joint tenderness, deformity or swelling  Extremities - peripheral pulses normal, no pedal edema, no clubbing or cyanosis  Skin - normal coloration and turgor, no rashes, no suspicious skin lesions noted    Medications:     Current Facility-Administered Medications   Medication Dose Route Frequency   . albuterol-ipratropium  3 mL Nebulization Q6H SCH   . amLODIPine  10 mg Oral Daily   . atorvastatin  40 mg Oral QHS   . calcitRIOL  0.25 mcg Oral Daily   . cloNIDine  0.2 mg Oral BID   . clopidogrel  75 mg Oral Daily   . darbepoetin alfa (ARANESP) injection  40 mcg Subcutaneous Weekly   . docusate sodium  100 mg Oral Daily   . fluticasone-salmeterol  1 puff Inhalation BID   . heparin (porcine)  5,000 Units Subcutaneous Q12H [facility]   . hydrALAZINE  100 mg Oral Q8H SCH   . isosorbide mononitrate  60 mg Oral Daily   . terazosin  5 mg Oral QHS   . venlafaxine  25 mg Oral BID       Intake and Output Summary (Last 24 hours) at Date Time  Intake/Output Summary (Last 24 hours) at 05/14/14 1012  Last data filed at 05/13/14 2013   Gross per 24 hour   Intake     75 ml   Output      0 ml   Net     75 ml           Labs:       Recent Labs  Lab 05/13/14  1000 05/10/14  0450 05/09/14  0425   SODIUM 140 139 139   POTASSIUM 4.2 3.6 3.7   CHLORIDE 110 109 109   CO2 22 24 23    BUN 9.0 6.0* 6.0*   CREATININE 1.4* 1.4* 1.4*   CALCIUM 8.3* 7.8* 8.0*   GLUCOSE 146* 156* 141*                   Rads:     Radiology Results (24 Hour)     ** No results found for the last 24 hours. **            Assessment:     CVA R HP aphasia  Renal failure  Hypertension  UTI   Left atrial appendage thrombus      Plan:   Bradycardia resolved      CVA RHP   Aphasia  Carotid neg MRI [name] MCA  distribution  On plavix 75 mg      TEE EF 50  Diffuse hypokinesis   Small thrombus in [name] atrial appendage    Hx CAD    Hx cardiomyopathy  Hx aortic stenosis     Acute on chronic crt stable    Encephalopathy  metablolic   resolved      DM SSI monitor glucose      R/O sepsis  Hypothermic resolved  Evaluated by ID  UC bld cx on invanz   cx proteus   Treatment completed  COPD duoneb    Hypertension   Stable      HLD on statin goal LDL < 70       On anticoagulation per  Neurology and cardiology     Iv heparin      Signed by: Laveda Norman, MD  05/14/2014  10:12 AM

## 2014-05-14 NOTE — Progress Notes (Signed)
RENAL PROGRESS NOTE    Date Time: 05/14/2014 11:22 AM  Patient Name: [name]  Attending Physician: Laveda Norman, MD    Assessment:   Acute kidney injury, has recovered.  Patient has stable stage III chronic kidney disease secondary to hypertension and vascular disease.  Hypertension with kidney disease, blood pressure is under good control.  Patient's target, blood pressure is higher than usual.  Anemia of chronic kidney disease, hemoglobin remains stable  Recent CVA with dysphasia  Diabetes, blood glucose control is fair.    Plan:   Discontinue IV fluids.  Encouraged oral fluid intake.  Antihypertensives adjusted yesterday.  No changes to current medication.  Follow renal function    Subjective:   Feels well and denies any new problems.  No shortness of breath.  Appetite is good and is able to swallow    Review of Systems:   Denies fever or chills  No cough  No chest pain  No vomiting or diarrhea  No swelling in legs  No urinary symptoms    Meds:     . albuterol-ipratropium  3 mL Nebulization Q6H SCH   . amLODIPine  10 mg Oral Daily   . atorvastatin  40 mg Oral QHS   . calcitRIOL  0.25 mcg Oral Daily   . cloNIDine  0.2 mg Oral BID   . clopidogrel  75 mg Oral Daily   . darbepoetin alfa   40 mcg Subcutaneous Weekly   . docusate sodium  100 mg Oral Daily   . fluticasone-salmeterol  1 puff Inhalation BID   . heparin (porcine)  5,000 U Subcutaneous Q12H [facility]   . hydrALAZINE  100 mg Oral Q8H SCH   . isosorbide mononitrate  60 mg Oral Daily   . terazosin  5 mg Oral QHS   . venlafaxine  25 mg Oral BID     Physical Exam:     BP 154/66 mmHg  Pulse 88  Temp(Src) 97.7 F (36.5 C) (Oral)  Resp 18  Ht 1.549 m (5\' 1" )  Wt 69.854 kg (154 lb)  BMI 29.11 kg/m2  SpO2 96%    Intake     75 ml   Output      0 ml   Net     75 ml     Unable to talk but alert and comprehends well.  Comfortable, nondyspneic  HEENT unremarkable, well-hydrated  No JVD  Lungs reveal no crackles  Heart sounds are normal, no new murmur  Abdomen  is soft and nontender, bowel sounds are normal.  No peripheral edema    Labs:        05/13/14   1000   SODIUM  140   POTASSIUM  4.2   CHLORIDE  110   CO2  22   BUN  9.0   CREATININE  1.4*   GLUCOSE  146*   CALCIUM  8.3*       Signed by: Nancy Nordmann, MD  [phone]

## 2014-05-15 LAB — GLUCOSE WHOLE BLOOD - POCT
Whole Blood Glucose POCT: 141 mg/dL — ABNORMAL HIGH (ref 70–100)
Whole Blood Glucose POCT: 195 mg/dL — ABNORMAL HIGH (ref 70–100)
Whole Blood Glucose POCT: 167 mg/dL — ABNORMAL HIGH (ref 70–100)
Whole Blood Glucose POCT: 127 mg/dL — ABNORMAL HIGH (ref 70–100)

## 2014-05-15 MED ORDER — HYDRALAZINE HCL 100 MG PO TABS
100.0000 mg | ORAL_TABLET | Freq: Three times a day (TID) | ORAL | Status: AC
Start: 2014-05-15 — End: ?

## 2014-05-15 MED ORDER — ISOSORBIDE MONONITRATE ER 60 MG PO TB24
60.0000 mg | ORAL_TABLET | Freq: Every day | ORAL | Status: AC
Start: 2014-05-15 — End: ?

## 2014-05-15 MED ORDER — ATORVASTATIN CALCIUM 40 MG PO TABS
40.0000 mg | ORAL_TABLET | Freq: Every evening | ORAL | Status: AC
Start: 2014-05-15 — End: ?

## 2014-05-15 MED ORDER — TERAZOSIN HCL 5 MG PO CAPS
5.0000 mg | ORAL_CAPSULE | Freq: Every evening | ORAL | Status: AC
Start: 2014-05-15 — End: ?

## 2014-05-15 MED ORDER — APIXABAN 5 MG PO TABS
5.0000 mg | ORAL_TABLET | Freq: Two times a day (BID) | ORAL | Status: AC
Start: 2014-05-15 — End: ?

## 2014-05-15 MED ORDER — AMLODIPINE BESYLATE 10 MG PO TABS
10.0000 mg | ORAL_TABLET | Freq: Every day | ORAL | Status: AC
Start: 2014-05-15 — End: ?

## 2014-05-15 MED ORDER — VENLAFAXINE HCL 25 MG PO TABS
25.0000 mg | ORAL_TABLET | Freq: Two times a day (BID) | ORAL | Status: AC
Start: 2014-05-15 — End: ?

## 2014-05-15 MED ORDER — FLUTICASONE-SALMETEROL 250-50 MCG/DOSE IN AEPB
1.0000 | INHALATION_SPRAY | Freq: Two times a day (BID) | RESPIRATORY_TRACT | Status: AC
Start: 2014-05-15 — End: ?

## 2014-05-15 MED ORDER — ZOLPIDEM TARTRATE 5 MG PO TABS
5.0000 mg | ORAL_TABLET | Freq: Every evening | ORAL | Status: AC | PRN
Start: 2014-05-15 — End: ?

## 2014-05-15 MED ORDER — CLONIDINE HCL 0.2 MG PO TABS
0.2000 mg | ORAL_TABLET | Freq: Two times a day (BID) | ORAL | Status: AC
Start: 2014-05-15 — End: ?

## 2014-05-15 NOTE — Plan of Care (Signed)
Problem: Day 4- Stroke  Goal: Neurovascular Status is Stable or Improving  Outcome: Progressing  The patient  learning abilities have been assessed. Today's individualized plan of care to monitor vitals, (HR), pain, accu-check, safety, anticoagulation med education, was discussed with the patient and  Pt agreed to it. Patient  demonstrates understanding of disease process, treatment plan, medications and consequences of noncompliance. All questions and concerns were addressed. Bed alarm activated for safety.

## 2014-05-15 NOTE — Progress Notes (Signed)
Wound care follow-up    Assessment   R lateral malleolus ulcer (POA) -  Wound measures 10cm x 3.8cm x UTA, two open areas with scarred area between. Mild odor, wound bed with 80% dry hard yellow slough and 20% pink tissue. Applied non adherent gauze to wound and covered with strip of calcium alginate with silver. Covered all with non adhesive foam and secured with conform stretch gauze with tape to outside of dressing. Aseptic technique, pt tolerated well.    Plan:  Calcium alginate with silver for exudate management and antimicrobial benefit. Wound care orders entered into EMR. Care rendered. WOC Nurse service to follow. Patient might benefit from Podiatry consult inpatient or outpatient.    Sheppard Plumber RN, MSN  Insurance account manager  [phone]

## 2014-05-15 NOTE — Progress Notes (Signed)
PROGRESS NOTE  [facility]  [address]  [place], [place] [address]  [phone]  Fax [phone]    Date Time: 05/15/2014 12:31 PM  Patient Name: [name], [name]      Assessment:   Acute stroke  Left atrial appendage thrombus  Bradycardia , resolved, now SR  HTN   Coronary artery disease  - CABG 2015  CKD  DM  Moderate MR  Moderate TR      Problem List:     Active Hospital Problems    Diagnosis   . Cerebrovascular accident (CVA) due to embolism of precerebral artery   . Coronary artery disease involving native coronary artery, angina presence unspecified, unspecified whether native or transplanted heart   . Renal failure   . Bradycardia   . Acute on chronic renal failure   . Encephalopathy   . Azotemia   . CAD (coronary artery disease)   . Chronic obstructive pulmonary disease, unspecified COPD type        Plan:   Continue eliquis bid  Continue current anti HTN medications  Continue statin  No further cardiac work up at this time. Ok from cardiac standpoint to d/c/transer etc      Subjective:   Aphasic      Medications:     Current Facility-Administered Medications   Medication Dose Route Frequency Last Rate Last Dose   . acetaminophen (TYLENOL) tablet 650 mg  650 mg Oral Q4H PRN   650 mg at 04/30/14 2300    Or   . acetaminophen (TYLENOL) suppository 650 mg  650 mg Rectal Q4H PRN       . acetaminophen (TYLENOL) tablet 650 mg  650 mg Oral Q4H PRN   650 mg at 04/29/14 0420   . albuterol-ipratropium (DUO-NEB) 2.5-0.5(3) mg/3 mL nebulizer 3 mL  3 mL Nebulization Q6H SCH   3 mL at 05/15/14 0941   . ALPRAZolam (XANAX) tablet 0.25 mg  0.25 mg Oral Q6H PRN   0.25 mg at 05/03/14 1845   . amLODIPine (NORVASC) tablet 10 mg  10 mg Oral Daily   10 mg at 05/15/14 1027   . apixaban (ELIQUIS) tablet 5 mg  5 mg Oral Q12H SCH   5 mg at 05/15/14 0409   . atorvastatin (LIPITOR) tablet 40 mg  40 mg Oral QHS   40 mg at 05/14/14 2225   . atropine injection 0.5 mg  0.5 mg Intravenous PRN       .  bisacodyl (DULCOLAX) suppository 10 mg  10 mg Rectal QD PRN       . calcitRIOL (ROCALTROL) capsule 0.25 mcg  0.25 mcg Oral Daily   0.25 mcg at 05/15/14 1027   . cloNIDine (CATAPRES) tablet 0.2 mg  0.2 mg Oral BID   0.2 mg at 05/15/14 1027   . darbepoetin alfa (ARANESP) injection 40 mcg  40 mcg Subcutaneous Weekly   40 mcg at 05/08/14 1749   . dextrose 50 % bolus 25 mL  25 mL Intravenous PRN       . dextrose 50 % bolus 25 mL  25 mL Intravenous PRN       . dextrose 50 % bolus 25 mL  25 mL Intravenous PRN       . docusate sodium (COLACE) capsule 100 mg  100 mg Oral Daily   100 mg at 05/15/14 1027   . fentaNYL (SUBLIMAZE) injection 12.5 mcg  12.5 mcg Intravenous Q4H PRN   12.5 mcg at 04/30/14  2120   . fluticasone-salmeterol (ADVAIR DISKUS) 250-50 MCG/DOSE 1 puff  1 puff Inhalation BID   1 puff at 05/15/14 0941   . glucagon (rDNA) (GLUCAGEN) injection 1 mg  1 mg Intramuscular PRN       . glucagon (rDNA) (GLUCAGEN) injection 1 mg  1 mg Intramuscular PRN       . glucagon (rDNA) (GLUCAGEN) injection 1 mg  1 mg Intramuscular PRN       . hydrALAZINE (APRESOLINE) injection 10 mg  10 mg Intravenous Q6H PRN   10 mg at 05/03/14 0825   . hydrALAZINE (APRESOLINE) tablet 100 mg  100 mg Oral Q8H SCH   100 mg at 05/15/14 1027   . insulin aspart (NovoLOG) injection 1-3 Units  1-3 Units Subcutaneous QHS PRN   1 Units at 05/14/14 2225   . insulin aspart (NovoLOG) injection 1-5 Units  1-5 Units Subcutaneous PRN       . insulin aspart (NovoLOG) injection 1-5 Units  1-5 Units Subcutaneous TID AC PRN   1 Units at 05/14/14 1817   . isosorbide mononitrate (IMDUR) 24 hr tablet 60 mg  60 mg Oral Daily   60 mg at 05/15/14 1027   . naloxone (NARCAN) injection 0.2 mg  0.2 mg Intravenous PRN       . polyethylene glycol (MIRALAX) packet 17 g  17 g Oral QD PRN       . terazosin (HYTRIN) capsule 5 mg  5 mg Oral QHS   5 mg at 05/14/14 2225   . venlafaxine [facility]) tablet 25 mg  25 mg Oral BID   25 mg at 05/15/14 1027   . zolpidem (AMBIEN) tablet 5 mg   5 mg Oral QHS PRN   5 mg at 05/04/14 2211        Physical Exam:     Filed Vitals:    05/15/14 1149   BP: 125/49   Pulse: 86   Temp: 96.4 F (35.8 C)   Resp: 18   SpO2: 94%       Intake and Output Summary (Last 24 hours) at Date Time  No intake or output data in the 24 hours ending 05/15/14 1231      CV:rr  Lungs:cta  Abd:s  Ext:no edema    Labs:     Results     Procedure Component Value Units Date/Time    Glucose Whole Blood - POCT [[id number]]  (Abnormal) Collected:  05/15/14 1147     POCT - Glucose Whole blood 195 (H) mg/dL Updated:  14/78/29 5621    Glucose Whole Blood - POCT [[id number]]  (Abnormal) Collected:  05/15/14 0715     POCT - Glucose Whole blood 141 (H) mg/dL Updated:  96/29/52 8413    Glucose Whole Blood - POCT [[id number]]  (Abnormal) Collected:  05/14/14 2212     POCT - Glucose Whole blood 177 (H) mg/dL Updated:  53/66/44 0347    Glucose Whole Blood - POCT [[id number]]  (Abnormal) Collected:  05/14/14 1809     POCT - Glucose Whole blood 152 (H) mg/dL Updated:  56/43/32 9518    Glucose Whole Blood - POCT [[id number]]  (Abnormal) Collected:  05/14/14 1242     POCT - Glucose Whole blood 162 (H) mg/dL Updated:  16/01/09 3235            Signed by: Brunetta Genera, MD  Interventional Cardiology  [facility]  Office: [phone]  [facility] Link: [phone]

## 2014-05-15 NOTE — Progress Notes (Addendum)
PROGRESS NOTE    Date Time: 05/15/2014 3:25 PM  Patient Name: [name], [name]      Subjective:     Is a 64 year old female admitted with a left MCA stroke with right upper  extremity weakness and expressive aphasia.  The patient is afebrile,  clinically stable, no new complaints, has tolerated, OT/PT.  Will suggest  patient return to home.      Review of Systems:   A comprehensive review of systems was: General ROS: negative for - chills, fever or night sweats  Psychological ROS: negative for - anxiety, depression, disorientation, hallucinations or suicidal ideation  ENT ROS: negative for - headaches, nasal congestion, sinus pain or visual changes  Allergy and Immunology ROS: negative for - itchy/watery eyes, nasal congestion or postnasal drip  Hematological and Lymphatic ROS: negative for - bleeding problems, bruising, pallor or weight loss  Endocrine ROS: negative for - malaise/lethargy or polydipsia/polyuria  Respiratory ROS: negative for - cough, orthopnea, shortness of breath or wheezing  Cardiovascular ROS: negative for - chest pain, dyspnea on exertion, orthopnea or shortness of breath  Gastrointestinal ROS: negative for - abdominal pain, constipation, diarrhea or nausea/vomiting  Genito-Urinary ROS: negative for - change in urinary stream, dysuria, hematuria or nocturia  Musculoskeletal ROS: negative for - joint pain, joint stiffness or joint swelling  Neurological ROS: pos for RUE weakness and expressive aphasia  uritus, rash and skin lesion changes    Physical Exam:     Filed Vitals:    05/15/14 1149   BP: 125/49   Pulse: 86   Temp: 96.4 F (35.8 C)   Resp: 18   SpO2: 94%       General appearance -awake  Mental status -awake expressive aphasia   Eyes - pupils equal and reactive, extraocular eye movements intact  Nose - normal and patent, no erythema, discharge or polyps  Mouth - mucous membranes moist, pharynx normal without lesions  Neck - supple, no significant adenopathy  Lymphatics - no palpable  lymphadenopathy, no hepatosplenomegaly  Chest - clear to auscultation, no wheezes, rales or rhonchi, symmetric air entry  Heart - normal rate, regular rhythm, normal S1, S2, no murmurs, rubs, clicks or gallops  Abdomen - soft, nontender, nondistended, no masses or organomegaly  Neurological -  Pos expressive aphasia un changed  Musculoskeletal - no joint tenderness, deformity or swelling  Extremities - peripheral pulses normal, no pedal edema, no clubbing or cyanosis  Skin - normal coloration and turgor, no rashes, no suspicious skin lesions noted    Medications:     Current Facility-Administered Medications   Medication Dose Route Frequency   . albuterol-ipratropium  3 mL Nebulization Q6H SCH   . amLODIPine  10 mg Oral Daily   . apixaban  5 mg Oral Q12H SCH   . atorvastatin  40 mg Oral QHS   . calcitRIOL  0.25 mcg Oral Daily   . cloNIDine  0.2 mg Oral BID   . darbepoetin alfa (ARANESP) injection  40 mcg Subcutaneous Weekly   . docusate sodium  100 mg Oral Daily   . fluticasone-salmeterol  1 puff Inhalation BID   . hydrALAZINE  100 mg Oral Q8H SCH   . isosorbide mononitrate  60 mg Oral Daily   . terazosin  5 mg Oral QHS   . venlafaxine  25 mg Oral BID       Intake and Output Summary (Last 24 hours) at Date Time  No intake or output data in the 24 hours ending  05/15/14 1525        Labs:       Recent Labs  Lab 05/13/14  1000 05/10/14  0450 05/09/14  0425   SODIUM 140 139 139   POTASSIUM 4.2 3.6 3.7   CHLORIDE 110 109 109   CO2 22 24 23    BUN 9.0 6.0* 6.0*   CREATININE 1.4* 1.4* 1.4*   CALCIUM 8.3* 7.8* 8.0*   GLUCOSE 146* 156* 141*                   Rads:     Radiology Results (24 Hour)     ** No results found for the last 24 hours. **            Assessment:      he patient is a 64 year old female with cerebrovascular accident,  expressive aphasia, right hemiparesis, history of cardiomyopathy, aortic  stenosis, encephalopathy, acute on chronic kidney disease, hypertension.   Her condition is stable.        Plan:        The plan is to prepare the patient for discharge.  Medications and  discharge medication reconciliation form has been completed.  Case  management to arrange for home therapy and followup.  Will continue DuoNeb  unit dose q.6 hours, famotidine 10 mg daily, Eliquis 5 mg b.i.d., Lipitor  40 mg daily, Advair Diskus 250/50 one puff b.i.d., hydralazine 100 mg  t.i.d., Hytrin 5 mg daily.  Her condition is stable.             Signed by: Mechele Dawley, MD  05/15/2014  3:25 PM

## 2014-05-15 NOTE — Progress Notes (Signed)
RENAL PROGRESS NOTE    Date Time: 05/15/2014 11:20 AM  Patient Name: [name]  Attending Physician: Laveda Norman, MD    Assessment:   Acute kidney injury, recovered  Stable stage III chronic kidney disease most likely from hypertensive nephrosclerosis and ischemic nephropathy from small vessel disease, no labs to review today.  Hypertension control is optimal.  Anemia of chronic kidney disease, stable.  Recent CVA with dysphagia.  Diabetes control is fair.  Left atrial appendage thrombus and likely embolic events, now on anticoagulation    Plan:   Anticoagulation per cardiology.  No changes to current medication from my viewpoint.  I will follow her renal function periodically.    Subjective:   Feels comfortable.  Patient is unable to talk, but able to communicate well with nodding and shaking head.  TEE results noted, on anticoagulation.  No new symptoms.  Maintaining good urine output, no urinary symptoms    Review of Systems:   Denies chest pain or palpitation  No abdominal pain, vomiting or diarrhea  No cough or fever  No shortness of breath  No headache  No swelling in legs  No new limb weakness    Meds:     . albuterol-ipratropium  3 mL Nebulization Q6H SCH   . amLODIPine  10 mg Oral Daily   . apixaban  5 mg Oral Q12H SCH   . atorvastatin  40 mg Oral QHS   . calcitRIOL  0.25 mcg Oral Daily   . cloNIDine  0.2 mg Oral BID   . darbepoetin alfa (ARANESP) injection  40 mcg Subcutaneous Weekly   . docusate sodium  100 mg Oral Daily   . fluticasone-salmeterol  1 puff Inhalation BID   . hydrALAZINE  100 mg Oral Q8H SCH   . isosorbide mononitrate  60 mg Oral Daily   . terazosin  5 mg Oral QHS   . venlafaxine  25 mg Oral BID     Physical Exam:     BP 136/54 mmHg  Pulse 79  Temp(Src) 98.3 F (36.8 C) (Oral)  Resp 16  Ht 1.549 m (5\' 1" )  Wt 69.854 kg (154 lb)  BMI 29.11 kg/m2  SpO2 95%    Intake    100 ml   Output      0 ml   Net    100 ml     Alert, oriented and comfortable  Unable to talk.  No new  findings on HEENT  No JVD  Lungs are clear.  Heart sounds are unchanged, no new murmur  Abdomen is nontender, bowel sounds are present.  No pedal edema    Labs:        05/13/14   1000   SODIUM  140   POTASSIUM  4.2   CHLORIDE  110   CO2  22   BUN  9.0   CREATININE  1.4*   GLUCOSE  146*   CALCIUM  8.3*       Signed by: Nancy Nordmann, MD  [phone]

## 2014-05-15 NOTE — Plan of Care (Signed)
Problem: Neurological Deficit  Goal: Neurological status is stable or improving  Outcome: Progressing  The patient and care giver's learning abilities have been assessed. Today's individualized plan of care to do neuro checks, encourage oral intake, wound care, anticoagulation, PT/OT, discharge planning was discussed with the patient and care giver and agree to it. Patient and care giver demonstrates understanding of disease process, treatment plan, medications and consequences of noncompliance. All questions and concerns were addressed.             Comments:   Patient continues to have grip weaker on the right side. Right leg is weaker than left and patient walks with limp. Able to vocalize but unable to form words. Communication through writing and pointing. Family updated about possibility of discharge, working with case management. Patient has poor appetite, encouraged oral intake. Wound care done with wound nurse. Patient states she visits podiatrist outside of hospital. No complaints of pain. Will continue to monitor.

## 2014-05-15 NOTE — Consults (Signed)
[facility] Progress Note    Date Time: 05/15/2014 10:36 AM  Patient Name: [name], [name]    CC: Aphasia, thromboembolic stroke      Assessment:     Patient Active Problem List   Diagnosis   . NSTEMI (non-ST elevated myocardial infarction)   . Hypertensive urgency   . PAD (peripheral artery disease)   . Arterial leg ulcer   . Chronic obstructive pulmonary disease, unspecified COPD type   . Ischemic cardiomyopathy   . CAD (coronary artery disease)   . Diabetes   . Chronic kidney disease   . Debilitated   . Complete heart block   . Junctional bradycardia   . Hyperkalemia   . Renal failure   . Bradycardia   . Acute on chronic renal failure   . Encephalopathy   . Azotemia   . Coronary artery disease involving native coronary artery, angina presence unspecified, unspecified whether native or transplanted heart   . Cerebrovascular accident (CVA) due to embolism of precerebral artery       Plan:     Continue anticoagulation as recommended by cardiology.  Blood pressure  Statins  Speech therapy.  PT/OT    Interval History/Subjective:   Patient remains aphasic, denies any new symptoms.  Her right-sided weakness has improved.  She has been started on Epixaban by Cardiology.    Medications:     Current Facility-Administered Medications   Medication Dose Route Frequency   . albuterol-ipratropium  3 mL Nebulization Q6H SCH   . amLODIPine  10 mg Oral Daily   . apixaban  5 mg Oral Q12H SCH   . atorvastatin  40 mg Oral QHS   . calcitRIOL  0.25 mcg Oral Daily   . cloNIDine  0.2 mg Oral BID   . darbepoetin alfa (ARANESP) injection  40 mcg Subcutaneous Weekly   . docusate sodium  100 mg Oral Daily   . fluticasone-salmeterol  1 puff Inhalation BID   . hydrALAZINE  100 mg Oral Q8H SCH   . isosorbide mononitrate  60 mg Oral Daily   . terazosin  5 mg Oral QHS   . venlafaxine  25 mg Oral BID       Review of Systems:   Neurological ROS: All other systems were reviewed and are negative except as previously note din the HPI.        Physical  Exam:   Temp:  [97.9 F (36.6 C)-98.3 F (36.8 C)] 98.3 F (36.8 C)  Heart Rate:  [79-89] 79  Resp Rate:  [16-18] 16  BP: (103-166)/(50-68) 136/54 mmHg    Constitutional: Vital signs reviewed.   Head: Normocephalic, atraumatic, neck supple no JVD  Eyes: No conjunctival injection. No discharge.   ENT: Mucous membranes moist   Neck: Normal range of motion. Non tender.   Respiratory/Chest: Clear to auscultation. No respiratory distress.   Cardiovascular: Regular rate and rhythm. No murmur.   Lower Extremity: No edema. No cyanosis.   Skin: Warm and dry. No rash.   Lymphatic: No cervical lymphadenopathy.   Psychiatric: Normal affect.     Mental Status: The patient was awake, alert, and oriented.Patient is aphasic - expressive, Cranial Nerves: Pupils were equally round and reactive to light. Extraocular  movements were intact, without nystagmus. The patient's face was symmetric,   and facial sensation was intact. Tongue and palate were midline.   Motor Exam: The patient had mild weakness in the right upper extremity Tone and bulk appeared   normal. There were no  abnormal movements or tremor noted.   Sensation: Light touch grossly intact.   Gait: Normal, with normal tandem walking.   Deep Tendon Reflexes: 1+ and symmetric throughout.   Toes were downgoing.      Labs:     Results     Procedure Component Value Units Date/Time    Glucose Whole Blood - POCT [[id number]]  (Abnormal) Collected:  05/15/14 0715     POCT - Glucose Whole blood 141 (H) mg/dL Updated:  95/62/13 0865    Glucose Whole Blood - POCT [[id number]]  (Abnormal) Collected:  05/14/14 2212     POCT - Glucose Whole blood 177 (H) mg/dL Updated:  28/41/32 4401    Glucose Whole Blood - POCT [[id number]]  (Abnormal) Collected:  05/14/14 1809     POCT - Glucose Whole blood 152 (H) mg/dL Updated:  40/34/74 2595    Glucose Whole Blood - POCT [[id number]]  (Abnormal) Collected:  05/14/14 1242     POCT - Glucose Whole blood 162 (H) mg/dL Updated:  29/51/88 4166           Rads:   No results found.      Signed by:  Lynann Bologna

## 2014-05-15 NOTE — OT Progress Note (Signed)
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  She nods yes to indicate she wants to go home with family supervision and home health OT.   Patient Goal: d/c to home   Patient's medical condition is appropriate for Occupational Therapy intervention at this time.  Patient is agreeable to participation in the therapy session.  Pain Assessment  Pain Assessment: No/denies pain      Objective:  Observation of Patient/Vital Signs:    BP167/90, Hr 87.      Patient received seated at edge of bed with Telemetry in place.    Cognition/Neuro Status  Arousal/Alertness: Appropriate responses to stimuli  Attention Span: Appears intact  Orientation Level: Oriented X4  Memory: Appears intact  Following Commands: Follows multistep commands with repetition  Safety Awareness: independent  Insights: Fully aware of deficits  Problem Solving: minimal assistance  Behavior: calm;cooperative  Motor Planning: intact  Coordination: intact  Hand Dominance: right handed    Functional Mobility  Rolling: Independent  Scooting Transfers:  Independent  Supine to Sit Transfers: Independent  Sit to Supine Transfers: Independent  Sit to Stand Transfers: Independent  Stand to Sit Transfers: Independent  Bed to Chair Transfers: Supervision  Bed to Toilet Transfers: Supervision  Chair Transfers: Supervision  Toilet Transfers: Supervision  Functional Mobility/Ambulation: Supervision    Self-care and Home Management  Eating: Independent  Grooming: setup;Supervision;standing at sink;wash/dry hands;wash/dry face (oral hygiene)  Bathing: Supervision  UB Dressing: Independent  LB Dressing: Independent  Toileting: Independent  Functional Transfers: Supervision    Therapeutic Exercises  Shoulder AROM: Flexion;Abduction;Sitting (RUE and LUE demonst full AROM)  Shoulder Strengthening:  (Pt demonstrates 4/5+ mm strength in B UE's.)    Neuro Re-Ed  Balance Training: Standing reaching activities                          Educated the patient to role of occupational therapy, plan of care, goals of therapy and HEP, safety with mobility and ADLs.     Patient left sitting EOB with call bell within reach. RN notified of session outcome.       Assessment:Pt presents with significant improvements in self-care and functional mobility this treatment.  Pt has met 5/6 goals, with 2 goals still being addressed.  Pt d/c recommendation has  been changed to home with supervision and [facility] OT.  Pt's primary limitation at this time is in area of speech.          Plan:  Goal Formulation: Patient  OT Plan  Risks/Benefits/POC Discussed with Pt/Family: With patient  Treatment Interventions: ADL retraining, Functional transfer training, Neuro muscular reeducation  Discharge Recommendation: Home with supervision, Home with home health OT  DME Recommended for Discharge:  (May benefit from W/C)  OT Frequency Recommended: 2-3x/wk  OT - Next Visit Recommendation: 05/19/14    Time For Goal Achievement: by time of discharge  ADL Goals  Patient will dress upper body: Goal met  Patient will dress lower  body: Goal met  Pt will complete bathing: Goal met, New goal (Pt will complete bathing independently.)  Patient will toilet: Goal met  Mobility and Transfer Goals  Pt will perform functional transfers: Partly met        Executive Fucntion Goals  Pt will follow energy conservation techniques:  (Goal From Prior Admission)              Continue plan of care.       DME Recommended for Discharge:  Pt states she has FWW at home.   Discharge Recommendation: Home with supervision, Home with home health OT     [place] OTR/L, [id number]  05/15/14

## 2014-05-16 LAB — RENAL FUNCTION PANEL
Albumin: 2.5 g/dL — ABNORMAL LOW (ref 3.5–5.0)
Anion Gap: 8 (ref 5.0–15.0)
BUN: 8 mg/dL (ref 7.0–19.0)
CO2: 21 mEq/L — ABNORMAL LOW (ref 22–29)
Calcium: 8.3 mg/dL — ABNORMAL LOW (ref 8.5–10.5)
Chloride: 109 mEq/L (ref 100–111)
Creatinine: 1.3 mg/dL — ABNORMAL HIGH (ref 0.6–1.0)
Glucose: 141 mg/dL — ABNORMAL HIGH (ref 70–100)
Phosphorus: 2 mg/dL — ABNORMAL LOW (ref 2.3–4.7)
Potassium: 4 mEq/L (ref 3.5–5.1)
Sodium: 138 mEq/L (ref 136–145)

## 2014-05-16 LAB — CBC
Hematocrit: 32.8 % — ABNORMAL LOW (ref 37.0–47.0)
Hgb: 9.9 g/dL — ABNORMAL LOW (ref 12.0–16.0)
MCH: 27.1 pg — ABNORMAL LOW (ref 28.0–32.0)
MCHC: 30.2 g/dL — ABNORMAL LOW (ref 32.0–36.0)
MCV: 89.9 fL (ref 80.0–100.0)
MPV: 10.5 fL (ref 9.4–12.3)
Nucleated RBC: 0 /100 WBC (ref 0–1)
Platelets: 257 10*3/uL (ref 140–400)
RBC: 3.65 10*6/uL — ABNORMAL LOW (ref 4.20–5.40)
RDW: 19 % — ABNORMAL HIGH (ref 12–15)
WBC: 3.93 10*3/uL (ref 3.50–10.80)

## 2014-05-16 LAB — GLUCOSE WHOLE BLOOD - POCT
Whole Blood Glucose POCT: 124 mg/dL — ABNORMAL HIGH (ref 70–100)
Whole Blood Glucose POCT: 197 mg/dL — ABNORMAL HIGH (ref 70–100)
Whole Blood Glucose POCT: 133 mg/dL — ABNORMAL HIGH (ref 70–100)

## 2014-05-16 LAB — GFR: EGFR: 49.7

## 2014-05-16 LAB — HEMOLYSIS INDEX: Hemolysis Index: 10 (ref 0–18)

## 2014-05-16 NOTE — Progress Notes - NICU (Signed)
Dr. Wilhelmina Mcardle paged to make aware of pt accepted to [facility].  Need discharge summary and signed prescriptions for narcotics and sedatives prior to discharge.  Awaiting for call back.

## 2014-05-16 NOTE — Progress Notes (Signed)
Pt accepted at [facility], Room 313, Report [phone], Fax, [phone]. Updated Marcelino Duster RN.   Per Marchelle Folks in admissions, she has spoken to [facility] and the family plans to transport. Updated RN.  Met with pt who still was hoping for [facility].  Informed that she did not meet criteria and what her dtr's have planned.     Updated notes faxed to [facility].

## 2014-05-16 NOTE — Discharge Instructions (Signed)
Stroke and Heart Disease  Every part of your body, including your heart and your brain, needs oxygen to work. Oxygen is carried in the blood. Blood vessels called arteries carry oxygen-rich blood throughout the body. Both heart attack and stroke are due to problems in the arteries. The same factors that cause heart disease can make you more likely to have a stroke.   Heart attack.A heart attack is caused by blockage in an artery that carries blood to the heart muscle. If blood is blocked, that part of the heart muscle is damaged or dies.   Stroke. If an artery supplying the brain is blocked, a stroke may result. This is called an ischemic stroke. Itis caused by a piece of plaque breaking loose from an artery (such as a carotid artery in the neck) or from the heartand lodging in the brain. A stroke caused by the rupture of a weakened blood vessel is called a hemorrhagic stroke.  Both heart attack and stroke are medical emergencies that can lead to serious health problems. They can even be fatal.     Healthy artery  A healthy artery is a tube with flexible walls and a smooth inner lining. Blood flows freely through it.  Unhealthy artery  Artery problems start when the inner lining gets damaged. This is often due to risk factors such as smoking and high blood pressure. These can make the artery walls stiff. Plaque, afatty mix of cholesterol and other material, forms in the lining. This narrows the channel. Plaque can break, restricting blood flow further. It can also cause a blood clot to form. A blood clot may block the artery's channel completely.  Reducing your risk  Making changes that make your arteries healthier will help lower your risk for both heart attack and stroke. If you have heart disease, you may need to work on a few aspects of your lifestyle. But remember that the things that are good for your arteries, heart, and brain are also good for the rest of your body.  Your health care provider will  work with you to modify lifestyle factors as needed to help prevent progression of atherosclerotic cardiovascular disease. This can lead to heart attack or stroke. Factors you may need to work on include:   Diet. Your health care provider will give you information on dietary changes that you may need to make based on your situation. Your provider may recommend that you see a registered dietitian for help with diet changes. Changes may include:   Reducing fat and cholesterol intake   Reducing sodium (salt) intake, especially if you have high blood pressure   Increasing your intake of fresh vegetables and fruits   Eating lean proteins, such as fish, poultry, and legumes (beans and peas) and eating less red meat and processed meats   Using low- or no-fat diary products   Using vegetable and nut oils in limited amounts   Limiting sweets and processed foods such as chips, cookies, and baked goods   Physical activity. Your health care provider may recommend that you increase your physical activity if you have not beenas active as possible. Depending on your situation, your provider may advise you to include moderate to vigorous intensity activity for at least 40 minutes each day for at least 3 to 4 days per week. Examples of moderate to vigorous activity include:   Walking at a brisk pace, about 3 to 4 miles per hour   Jogging or running   Swimming or   water aerobics   Hiking   Dancing   Martial arts   Tennis   Riding a bike or a stationary bike   [employer] If you are overweight or obese, your health care provider will work with you to lose weight and lower your BMI (body mass image) to a normal or near-normal level. Making dietary changes and increasing physical activity can help.   Smoking. If you smoke, break the smoking habit. Enroll in a stop-smoking program to improve your chances of success.   Stress. Learn stress management techniques to help you deal with stress in your home and work  life.     [address] [employer] [address] [place], [place] [address]. All rights reserved. This information is not intended as a substitute for professional medical care. Always follow your healthcare professional's instruction      Discharge Instructions for Stroke  You have been diagnosed with stroke. During a stroke, blood stops flowing to part of your brain. This can damage areas in the brain that control other parts of the body. Symptoms after a stroke depend on which part of the brain has been affected.  Stroke risk factors  Once you've had a stroke, you're at greater risk for another one. Listed below are some other factors that can increase your risk for another stroke:  High blood pressure  High cholesterol  Cigarette or cigar smoking  Diabetes  Carotid or other artery disease  Atrial fibrillation, atrial flutter,or other heart disease  Physical inactivity  Obesity  Certain blood disorders (such as sickle cell anemia)  Excessive alcohol use  Abuse of illegal drugs  Race  Gender  Family history of stroke  Diet high in salty, fried, or greasy foods  Changes in daily living  Doingyour regular tasks may be difficult after you've had a stroke, but you can learn new ways to manage your daily activities. In fact, doing daily activities may help you to regain muscle strength and bring back function to affected limbs. Be patient, give yourself time to adjust, and appreciate the progress you make.  Daily activities    You may be at risk of falling. Make changes to your home to help you walk more easily. A therapist will decide if you need an assistive device to walk safely.  You may need to see an occupational or physical therapist to learn new ways of doing things. For example, you may need to make adjustments when bathing or dressing:  Try the following tips for showering or bathing:  Test the water temperature with a hand or foot that was not affected by the stroke.  Use grab bars, a shower  seat, a hand-held showerhead, and a long-handled brush.  Try the following tips for dressing:  Dress while sitting, starting with the affected side or limb.  Wear shirts that pull easily over your head and pants or skirts with elastic waistbands.  Use zippers with loops attached to the pull tabs.  Lifestyle changes  Take your medications exactly as directed. Don't skip doses.  Your health care provider will give you information on dietary changes that you may need to make, based on your situation. Your provider may recommend that you see a registered dietitian for help with diet changes. Changes may include:  Reducing fat and cholesterol intake  Reducing sodium (salt) intake, especially if you have high blood pressure  Increasing your intake of fresh vegetables and fruits  Eating lean proteins, such as fish, poultry,  and legumes (beans and peas) and eating less red meat and processed meats  Using low-fat dairy products  Using vegetable and nut oils in limited amounts  Limiting sweets and processed foods such as chips, cookies, and baked goods.  Begin an exercise program. Ask your doctor how to get started and how much activity you should try to get on a daily or weekly basis. You can benefit from simple activities such as walking or gardening.  Limit alcohol intake. Men should haveno more than 2 alcoholic drinks a day. Women should limit themselves to 1 alcoholic drink per day.  Know your cholesterol level. Follow your doctor's recommendations about how to keep cholesterol under control.  If you are a smoker, it is time to quit now. Enroll in a stop-smoking program to improve your chances of success. Ask your doctor about medications or other methods to help you quit.  Learn stress management techniques to help you deal with stress in your home and work life.  Follow-up care  Keep your medical appointments. Close follow-up is important to stroke rehabilitation and recovery.  Some medications require blood tests to  check for progress or problems. Keep follow-up appointments for any blood tests ordered by your doctors.     [address] [employer] [address] [place], [place] [address]. All rights reserved. This information is not intended as a substitute for professional medical care. Always follow your healthcare professional's instructions.

## 2014-05-16 NOTE — Progress Notes (Signed)
Liaison from [facility], [place], [place]. came to unit to meet with the pt.  Pt agreed on transfer to [facility].  Chart information including d/c summary and narcotic scripts given to liaison.    Last minute request by dtr for CM to arrange transport. As pt does not meet ambulance criteria, explored w/c van.  Pt would be alone in the [place] out of site of the driver. Concerns regarding her inability to communicate. Call placed to dtr who now agrees to come this evening to pick up the pt.  Cleared for late admission to SNF.     Case Management Facility [place] Checklist    Name of Receiving Facility and Bed #:  Receiving Facility/Home Health/Hospice/Agency: [facility]     Number for Floor RN to Call Report: Contact Information Provided: Yes (Room 313, Report [phone])     Name and number of discharge nurse and time nurse notified: Marcelino Duster, AM   Family Member notified of transfer plan: Patient/Family/POA notified of transfer plan: Yes     Fax number of Receiving Facility: [phone]   Hard Copy of any narcotic prescriptions in envelope to travel with the patient: Hard copy of narcotic RX sent with patient?: Yes     Hard Copy of Durable DNR and/or advanced directive in envelope to travel with the patient: Hard copy of DNR/Advance Directive sent with patient?: N/A     Phone number and name of attending physician at receiving facility (MD to MD handoff):            Transportation  Mode and Time of Transportation: Mode of Transportation: Car         SNF Only  I have completed the Medicaid Pre- Screening ([place], DMAS 96 & 97) and faxed it via ECIN/Allscripts to the receiving facility. Medicaid Pre-Screening completed: No     I have completed the DMAS 95 - MI/MR form and faxed it via ECIN/Allscripts to the receiving facility. DMAS 95 MI/MR completed and faxed: Yes     Does the DMAS 95 - MI/MR trigger a Level 2 screening? DMAS 95 MI/MR trigger Level 2 Screening?: No       Medicare Only  I have validated that  this patient has a 3 midnight inpatient qualifying stay (SNF only). 3 midnight inpatient qualifying stay (SNF only): Yes     I have validated that the patient has received the second Important Message from Medicare (IMM) letter.         The following was routed via Epic to the Receiving Facility:  Document Routed via Epic on Day of Discharge?   History & Physical y   Discharge Medication Reconciliation (.DCMEDLIST in note) y   D/C order y   Last day of PT/OT notes (if applicable) y   D/C Summary y   PICC Line Report (if applicable) na   Most Recent Chest Xray y   All MD Consults y   Isolation Requirement (go to isolation order in other orders section of chart review) Y, culture results   Urine/Wound/Blood Culture Results  y   Discharge Instructions from AVS (use Pasteboard option to copy & paste from AVS into a progress note) y   Tube Feeding or TPN Order (rate & formula) if applicable na   Last 3 days of MD Progress Notes y

## 2014-05-16 NOTE — Progress Notes (Signed)
Spoke with dtr, Jolanda, who states their first choice is [facility] in [place], [place] and second choice [place].  Pt is accepted at [facility], with tentative acceptance at [facility].  Updated RN that the following is needed:  Discharge Summary, Signed prescriptions for narcotics and sedatives as per AVS.  RN to page MD.

## 2014-05-16 NOTE — Plan of Care (Signed)
Problem: Neurological Deficit  Goal: Neurological status is stable or improving  Outcome: Progressing  The patient and care giver's learning abilities have been assessed. Today's individualized plan of care to continue with neuro checks, encourage ambulation and oral intake. Plan was discussed with the patient and care giver and agree to it. Patient and care giver demonstrates understanding of disease process, treatment plan, medications and consequences of noncompliance. All questions and concerns were addressed.     Pt continues to communicate through writing and pointing unable to from words.  Pt continues to have right sided deficits.  Family and pt updated of discharge later today.

## 2014-05-16 NOTE — Progress Notes (Signed)
Late entry: Spoke with pt's dtr, Jolanda, late on Sunday regarding plan for discharge and recommendation for home with Home Health.  Dtr. States she feels unable to provide care to her mother at this point and requests d/c to acute rehab as planned. Message for left for [facility] to re-eval this AM and pt does not meet criteria.    In anticipation had discussed plan for SNF.  Dtr stated she would pick up list of accepting SNF's when visiting her mother in the evening. She was informed that they would need to choose facility today.  Just now, left message on dtr's cell and home phones.  Await call back.

## 2014-05-16 NOTE — Progress Notes (Signed)
Nutritional Support Services  Nutrition Assessment    [name] 64 y.o. female   MRN: [id number]    Summary of Nutrition Recommendations:  1. Continue current diet with modified consistency per SLP recommendations     2. Encourage PO intake with meals     3. Ensure Pudding TID     -----------------------------------------------------------------------------------------------------------------                                                      Assessment Data:     Nutrition: f/u to RD assessment from 2/22 for wounds. RN reports that pt is eating well and tolerating diet.     SLP last seen on 2/24- recommended upgrade diet to mechanical soft and thin liquids     Hospital Admission: Transferred to [facility] from 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     PSH: has past surgical history that includes Cardiac angiography and angioplasty (06/2013); Colonoscopy (Unknown); Tubal ligation; CORONARY ARTERY BYPASS (N/A, 03/04/2014); ENDOSCOPIC,VEIN HARVEST (Left, 03/04/2014); and TEE (N/A, 03/04/2014).     Current Diet Order  Diet dysphagia PUREED Liquid consistency:: Honey Thick; Additional restrictions:: Consistent carbohydrate; Patient preferences: Ensure pudding TID  Intake:    25% of meal @ 1200 (2/27)   25% of meal @ 0800 (2/27)    25% of meal @ 2013 (2/26)   25% of meal @ 1000 (2/21)    10% of meal @ 1800 (2/20)    50% of meal @ 1300 (2/20)    20% of meal @ 0900 (2/20)    30% of meal @ 1800 (2/19)    20% of meal @  1200 (2/19)    10% of meal @ 0800 (2/19)     ANTHROPOMETRIC  Anthropometrics  Height: 154.9 cm (5\' 1" )  Weight: 64.32 kg (141 lb 12.8 oz)  Weight Change: -7.92  IBW/kg (Calculated) Female: 50.91 kg  IBW/kg (Calculated) Female: 47.73 kg  BMI (calculated): 32.7    Wt Readings from Last 10 Encounters:   05/16/14 64.32 kg (141 lb 12.8 oz)   04/22/14 79.9 kg (176 lb 2.4 oz)     Physical Assessment:   Head: missing teeth   Edema: right upper extremity, right lower extremity   Skin: R lateral malleolus ulcer   GI function: WDL (2/29)     ESTIMATED NEEDS  Estimated Energy Needs  Total Energy Estimated Needs: 1523-1650 kcal/day   Method for Estimating Needs: Huey Romans with AF 1.2 -1.3    Estimated Protein Needs  Total Protein Estimated Needs: 94-117 gm pro/day   Method for Estimating Needs: 1.2-1.5 gm pro/kg using wt 78.3 kg     Fluid Needs  Total Fluid Estimated Needs: 1500 ml/day   Method for Estimating Needs: 1 ml/kcal or per MD     Pertinent Medications: coalce     Pertinent labs:  Calcium low (8.3), Phos low (2.0)   Albumin low (2.5)  Learning Needs:  None at this time     Nutrition Assessment Summary: PO intake consistently <50% of meals from 2/19-2/17 per flowsheet documentation. RN states that pt is eating well at time of nutrition assessment. SLP recommended diet upgrade- spoke with RN to change diet.                                                              Nutrition Diagnosis      Inadequate oral intake related to poor appetite as evidenced by meal intake <50% of lunch.                                                              Intervention     Nutrition recommendation -   1. Continue current diet with modified consistency per SLP recommendations     2. Encourage PO intake with meals     3. Ensure Pudding TID     Goal-   1. Tolerate diet with intake of 75% or greater within 4-7 days                                                              Monitoring     PO intake, labs, POC                                                          Evaluation   Goal 1: progressing  / ongoing goal     Nutrition Risk Level: high       Darci Current, RDN   Spectralink x [phone]  Nutrition Office x [phone]

## 2014-05-16 NOTE — Progress Notes (Signed)
[facility] Neu[facility] Progress Note    Date Time: 05/16/2014 8:32 AM  Patient Name: [name],[name]    CC: R side weakness    Assessment:   Left MCA and ACA distribution ischemia in a 64 y/o female with multiple risk factors including HTN, DM, PVD. Improving neurologically. Cardioembolic etiology in the setting of a left atrial thrombus.      Patient Active Problem List   Diagnosis   . NSTEMI (non-ST elevated myocardial infarction)   . Hypertensive urgency   . PAD (peripheral artery disease)   . Arterial leg ulcer   . Chronic obstructive pulmonary disease, unspecified COPD type   . Ischemic cardiomyopathy   . CAD (coronary artery disease)   . Diabetes   . Chronic kidney disease   . Debilitated   . Complete heart block   . Junctional bradycardia   . Hyperkalemia   . Renal failure   . Bradycardia   . Acute on chronic renal failure   . Encephalopathy   . Azotemia   . Coronary artery disease involving native coronary artery, angina presence unspecified, unspecified whether native or transplanted heart   . Cerebrovascular accident (CVA) due to embolism of precerebral artery       Plan:   -Continue Eliquis 5 mg BID per cardiology and statin with LDL goal <70  -PT/OT/SLP >> dispo. Does not qualify for acute rehab. Will need outpatient SLP  -Clear for discharge from a neuro standpoint. Can follow-up with [place] in 3-4 weeks      Interval History/Subjective:   No acute events documented or reported overnight  Expressive aphasia persists - vocalization however incomprehensible. Also with very mild persistent  right sided weakness. No alleviating factors       Medications:   Reviewed    Current Facility-Administered Medications   Medication Dose Route Frequency   . albuterol-ipratropium  3 mL Nebulization Q6H SCH   . amLODIPine  10 mg Oral Daily   . apixaban  5 mg Oral Q12H SCH   . atorvastatin  40 mg Oral QHS   . calcitRIOL  0.25 mcg Oral Daily   . cloNIDine  0.2 mg Oral BID   . darbepoetin alfa (ARANESP) injection  40 mcg Subcutaneous  Weekly   . docusate sodium  100 mg Oral Daily   . fluticasone-salmeterol  1 puff Inhalation BID   . hydrALAZINE  100 mg Oral Q8H SCH   . isosorbide mononitrate  60 mg Oral Daily   . terazosin  5 mg Oral QHS   . venlafaxine  25 mg Oral BID       Review of Systems:   Unable to complete 2/2 patient factors - aphasia/mute      Physical Exam:   Temp:  [96.4 F (35.8 C)-98.2 F (36.8 C)] 98.2 F (36.8 C)  Heart Rate:  [78-86] 78  Resp Rate:  [16-18] 16  BP: (125-155)/(49-74) 153/74 mmHg    Vital Signs: Reviewed. Stable as noted above.     General: The patient is a well developed female. Cooperative with exam. Sitting up in bed.   Extremities: no pedal edema, extremities normal in color; right foot wrapped in dressing    Mental Status: The patient is awake and alert. Nods yes/no appropriately. Positive vocaliztion, incomprehensible. She is able to follow commands appropriately. Normal affect.     Cranial nerves:   -CN III, IV, VI: Pupils equal, round, and reactive to light; extraocular movements intact; no ptosis              -  CN VII: Right facial weakness  -CN VIII: Hearing intact to conversational speech   -CN XII: Tongue deviates to the right    Motor: No atrophy. She is 4+/5 RUE, 4+/5 RLE. Mild right pronator drift. She is full strength on the left.     Reflexes: Deferred    Coordination: No tremors. No truncal ataxia    Gait: Steady      Labs:     Results     Procedure Component Value Units Date/Time    Glucose Whole Blood - POCT [[id number]]  (Abnormal) Collected:  05/11/14 0846     POCT - Glucose Whole blood 140 (H) mg/dL Updated:  91/47/82 9562       Rads:   No interval neuro imaging.     Signed by:   Katheren Shams, FNP-C  Nurse Practitioner  [place] [facility]  Daytime: [phone]  After 5:00 pm: [phone]      Patient seen and examined. Agree with above.  Doing well minimal right HP. Aphasia persists, no speech.  Likely cardiogenic etiology with left atrial thrombus.  Continue Eliquis.  Agree with discharge  plans.  We will see as outpatient.    Shams Fill B. Lance Coon, MD  [facility] Neurology  Medical Director of Neuroscience and Stroke  [facility]  [phone]  [phone]

## 2014-05-16 NOTE — Plan of Care (Signed)
Problem: Day 4- Stroke  Goal: Neurovascular Status is Stable or Improving  Outcome: Progressing  The patient learning abilities have been assessed. Today's individualized plan of care to monitor vitals, (HR), pain, accu-check, safety, D/C home  today was discussed with the patient and Pt agreed to it. Patient demonstrates understanding of disease process, treatment plan, medications and consequences of noncompliance. All questions and concerns were addressed. Bed alarm activated for safety.    Notes  Pt denied pain, remains SR, all medications administered as ordered, had stable vitals, am labs, will be D/C home today.

## 2014-05-16 NOTE — Progress Notes (Signed)
Medication List      START taking these medications          apixaban 5 MG   Commonly known as:  ELIQUIS   Take 1 tablet (5 mg total) by mouth every 12 (twelve) hours.       cloNIDine 0.2 MG tablet   Commonly known as:  CATAPRES   Take 1 tablet (0.2 mg total) by mouth 2 (two) times daily.       fluticasone-salmeterol 250-50 MCG/DOSE Aepb   Commonly known as:  ADVAIR DISKUS   Inhale 1 puff into the lungs 2 (two) times daily.       isosorbide mononitrate 60 MG 24 hr tablet   Commonly known as:  IMDUR   Take 1 tablet (60 mg total) by mouth daily.       terazosin 5 MG capsule   Commonly known as:  HYTRIN   Take 1 capsule (5 mg total) by mouth nightly.       zolpidem 5 MG tablet   Commonly known as:  AMBIEN   Take 1 tablet (5 mg total) by mouth nightly as needed for Sleep (Insomnia).         CHANGE how you take these medications          atorvastatin 40 MG tablet   Commonly known as:  LIPITOR   Take 1 tablet (40 mg total) by mouth nightly.   What changed:    - medication strength  - how much to take       hydrALAZINE 100 MG tablet   Commonly known as:  APRESOLINE   Take 1 tablet (100 mg total) by mouth every 8 (eight) hours.   What changed:  when to take this       venlafaxine 25 MG tablet   Commonly known as:  EFFEXOR   Take 1 tablet (25 mg total) by mouth 2 (two) times daily.   What changed:    - medication strength  - how much to take         CONTINUE taking these medications          acetaminophen 325 MG tablet   Commonly known as:  TYLENOL   Take 2 tablets (650 mg total) by mouth every 4 (four) hours as needed for Pain.       amiodarone 200 MG tablet   Commonly known as:  PACERONE   Take 1 tablet (200 mg total) by mouth daily.       amLODIPine 10 MG tablet   Commonly known as:  NORVASC   Take 1 tablet (10 mg total) by mouth daily.       calcitRIOL 0.25 MCG capsule   Commonly known as:  ROCALTROL   Take 1 capsule (0.25 mcg total) by mouth daily.       collagenase ointment   Commonly known as:  SANTYL   Apply  topically daily. The affected wound area.       gabapentin 300 MG capsule   Commonly known as:  NEURONTIN   Take 1 capsule (300 mg total) by mouth 2 (two) times daily.       insulin aspart 100 UNIT/ML injection   Commonly known as:  NovoLOG   Inject 1-4 Units into the skin 3 (three) times daily before meals as needed for High Blood Sugar. #90 insulin syringes and insulin needles       oxyCODONE-acetaminophen 5-325 MG per tablet   Commonly known as:  PERCOCET   Take  1 tablet by mouth every 4 (four) hours as needed.         STOP taking these medications          aspirin 325 MG tablet       carvedilol 25 MG tablet   Commonly known as:  COREG       furosemide 20 MG tablet   Commonly known as:  LASIX         Where to Get Your Medications     You need to pick up these prescriptions. We sent some of them to a specific pharmacy. Go to these places to get your medications.           [id number] - [place], [place] - [address] [facility] [facility]   -  amLODIPine 10 MG tablet   -  apixaban 5 MG   -  atorvastatin 40 MG tablet   -  cloNIDine 0.2 MG tablet   -  fluticasone-salmeterol 250-50 MCG/DOSE Aepb   -  hydrALAZINE 100 MG tablet   -  isosorbide mononitrate 60 MG 24 hr tablet   -  terazosin 5 MG capsule   -  venlafaxine 25 MG tablet    [address]   [facility]  [address]   Phone:  [phone]              You may get the following medications from any pharmacy   -  zolpidem 5 MG tablet

## 2014-05-16 NOTE — Discharge Summary (Signed)
Pt D/c .All pt belongings and D/C paper sent with patient.Pt came down via wheelchair by [employer] .Pt daughter pick up the patient.

## 2014-05-16 NOTE — Progress Notes (Signed)
RENAL PROGRESS NOTE    Date Time: 05/16/2014 9:04 AM  Patient Name: [name]  Attending Physician: Laveda Norman, MD    Assessment:   Acute kidney injury, improved.  Stage III chronic kidney disease, renal function stable at baseline.  Hypertension control is fair.  Patient's target for blood pressure is higher than usual cold.  Anemia of chronic kidney disease is stable.  Recent CVA with dysphasia  Diabetes remains under control  Left atrial thrombus, possible embolic event causing CVA, on anticoagulation    Plan:   No changes suggested to current medication.  PT, OT.  Will follow renal function as needed    Subjective:   Feels well.  Waiting to go to rehabilitation.  Maintaining good urine output.  No new complaint.  Unable to talk since her CVA but able to comprehend and respond well without talking    Review of Systems:   Patient denies any fever, cough or chest pain.  She has no shortness of breath  No nausea, vomiting, diarrhea or abdominal pain  No headache  No swelling in legs  No urinary symptoms    Meds:     . albuterol-ipratropium  3 mL Nebulization Q6H SCH   . amLODIPine  10 mg Oral Daily   . apixaban  5 mg Oral Q12H SCH   . atorvastatin  40 mg Oral QHS   . calcitRIOL  0.25 mcg Oral Daily   . cloNIDine  0.2 mg Oral BID   . darbepoetin alfa  40 mcg Subcutaneous Weekly   . docusate sodium  100 mg Oral Daily   . fluticasone-salmeterol  1 puff Inhalation BID   . hydrALAZINE  100 mg Oral Q8H SCH   . isosorbide mononitrate  60 mg Oral Daily   . terazosin  5 mg Oral QHS   . venlafaxine  25 mg Oral BID     Physical Exam:     BP 153/74 mmHg  Pulse 78  Temp(Src) 98.2 F (36.8 C) (Oral)  Resp 16  Ht 1.549 m (5\' 1" )  Wt 64.32 kg (141 lb 12.8 oz)  BMI 26.81 kg/m2  SpO2 92%    Intake    500 ml   Output      0 ml   Net    500 ml     Alert, oriented and comfortable, not talking.  Well-hydrated  No JVD  No pallor.  Lungs are clear.  Heart sounds are unchanged, no new murmur  Abdomen is soft with normal  bowel sounds  No pedal edema.  Moving all 4 limbs, plantars not elicitable    Labs:        05/16/14   0450   WBC  3.93   HEMOGLOBIN  9.9*   HEMATOCRIT  32.8*   PLATELETS  257     SODIUM  138  140   POTASSIUM  4.0  4.2   CHLORIDE  109  110   CO2  21*  22   BUN  8.0  9.0   CREATININE  1.3*  1.4*   GLUCOSE  141*  146*   CALCIUM  8.3*  8.3*   PHOSPHORUS  2.0*   --      ALBUMIN  2.5*       Signed by: Nancy Nordmann, MD  [phone]

## 2014-05-16 NOTE — Progress Notes (Signed)
Report given to [place] at [facility]. All questions answered. Pt for pick up at 2030 tonight.

## 2014-05-16 NOTE — Progress Notes - NICU (Signed)
[facility] REHAB LIAISON NOTE:    Does not meet criteria for AR. Notified Pollyann Savoy, CM that pt is denied admission to AR.      Thanks,  Dixie Dials, RN  Rehab Admissions Liaison  [phone]

## 2014-05-16 NOTE — Discharge Summary (Signed)
DISCHARGE NOTE    Date Time: 05/16/2014 2:09 PM  Patient Name: [name]  Attending Physician: Laveda Norman, MD    Date of Admission:   04/28/2014    Date of Discharge:   05/16/2014    Reason for Admission:   Diabetes mellitus [E11.9]    Problems:   Lists the present on admission hospital problems  Present on Admission:   . Renal failure  . CAD (coronary artery disease)  . Chronic obstructive pulmonary disease, unspecified COPD type    Hospital Problems:  Principal Problem:    Bradycardia  Active Problems:    Chronic obstructive pulmonary disease, unspecified COPD type    CAD (coronary artery disease)    Renal failure    Acute on chronic renal failure    Encephalopathy    Azotemia    Coronary artery disease involving native coronary artery, angina presence unspecified, unspecified whether native or transplanted heart    Cerebrovascular accident (CVA) due to embolism of precerebral artery      Problem Lists:  Patient Active Problem List   Diagnosis   . NSTEMI (non-ST elevated myocardial infarction)   . Hypertensive urgency   . PAD (peripheral artery disease)   . Arterial leg ulcer   . Chronic obstructive pulmonary disease, unspecified COPD type   . Ischemic cardiomyopathy   . CAD (coronary artery disease)   . Diabetes   . Chronic kidney disease   . Debilitated   . Complete heart block   . Junctional bradycardia   . Hyperkalemia   . Renal failure   . Bradycardia   . Acute on chronic renal failure   . Encephalopathy   . Azotemia   . Coronary artery disease involving native coronary artery, angina presence unspecified, unspecified whether native or transplanted heart   . Cerebrovascular accident (CVA) due to embolism of precerebral artery       Active Multi-disciplinary problems                 Discharge Dx:   CVA L MCA  Expressive aphasia   Bradycardia resolved  Hypertension  CAD /CABG   CKD  DM  Chronic RLE ulcer   PAD   COPD    Consultations:   Critical care pulmonary  Infectious  disease  Neurology  Cardiology  Renal       Procedures performed:   CT head   MRI head  Carotid doppler   Renal duplex  TEE    Hospital Course:   64 y/o female hx CAD CABG 12-15  AS ICM EF 45   Hx non healing LE ulcer CKD   [facility]  Echo 2/9 neg thrombus mass EF 50   Acute kidney injury azotemia  Admitted to ICU see critical care note    External pacer  Beta blockers held evaluated by critical care  Cardiology   Nephrology   Bradycardia resolved  Evaluated by infectious disease tx with iv antibiotics  tx with hemodialysis  Kidney function improved azotemia resolved  On aranesp for anemia clinically improved    On 05/05/2014 acute facial droop R sided weakness  Expressive aphasia    MRI head consistent with L MCA infarct  Evaluated by neurology  Carotid US negative  TEE  2/26 EF 50 small thrombus in L atrial appendage  Pt on anticoagulation  Patient  RUE weakness improved   Swallowing improved   Pt has expressive aphasia improved    On eliquis  5 mg bid for anticoagulation      Clinically stable for rehab    Follow up with neurology PCP cardiology infectious disease  Wound clinic             Discharge Medications:     Current Discharge Medication List      START taking these medications    Details   apixaban (ELIQUIS) 5 MG Take 1 tablet (5 mg total) by mouth every 12 (twelve) hours.  Qty: 60 tablet, Refills: 1      cloNIDine (CATAPRES) 0.2 MG tablet Take 1 tablet (0.2 mg total) by mouth 2 (two) times daily.  Qty: 60 tablet, Refills: 1      fluticasone-salmeterol (ADVAIR DISKUS) 250-50 MCG/DOSE Aerosol Powder, Breath Activtivatede Inhale 1 puff into the lungs 2 (two) times daily.  Qty: 1 each, Refills: 1      isosorbide mononitrate (IMDUR) 60 MG 24 hr tablet Take 1 tablet (60 mg total) by mouth daily.  Qty: 30 tablet, Refills: 1      terazosin (HYTRIN) 5 MG capsule Take 1 capsule (5 mg total) by mouth nightly.  Qty: 30 capsule, Refills: 1      zolpidem (AMBIEN) 5 MG  tablet Take 1 tablet (5 mg total) by mouth nightly as needed for Sleep (Insomnia).  Qty: 30 tablet, Refills: 1         CONTINUE these medications which have CHANGED    Details   amLODIPine (NORVASC) 10 MG tablet Take 1 tablet (10 mg total) by mouth daily.  Qty: 30 tablet, Refills: 1      atorvastatin (LIPITOR) 40 MG tablet Take 1 tablet (40 mg total) by mouth nightly.  Qty: 30 tablet, Refills: 1      hydrALAZINE (APRESOLINE) 100 MG tablet Take 1 tablet (100 mg total) by mouth every 8 (eight) hours.  Qty: 90 tablet, Refills: 1      venlafaxine (EFFEXOR) 25 MG tablet Take 1 tablet (25 mg total) by mouth 2 (two) times daily.  Qty: 60 tablet, Refills: 1         CONTINUE these medications which have NOT CHANGED    Details   acetaminophen (TYLENOL) 325 MG tablet Take 2 tablets (650 mg total) by mouth every 4 (four) hours as needed for Pain.      amiodarone (PACERONE) 200 MG tablet Take 1 tablet (200 mg total) by mouth daily.  Qty: 30 tablet, Refills: 0      calcitRIOL (ROCALTROL) 0.25 MCG capsule Take 1 capsule (0.25 mcg total) by mouth daily.  Qty: 30 capsule, Refills: 1      collagenase (SANTYL) ointment Apply topically daily. The affected wound area.  Qty: 90 g, Refills: 8      gabapentin (NEURONTIN) 300 MG capsule Take 1 capsule (300 mg total) by mouth 2 (two) times daily.  Qty: 60 capsule, Refills: 1      insulin aspart (NOVOLOG) 100 UNIT/ML injection Inject 1-4 Units into the skin 3 (three) times daily before meals as needed for High Blood Sugar. #90 insulin syringes and insulin needles  Qty: 1 mL, Refills: 0      oxyCODONE-acetaminophen (PERCOCET) 5-325 MG per tablet Take 1 tablet by mouth every 4 (four) hours as needed.  Qty: 100 tablet, Refills: 0         STOP taking these medications       aspirin 325 MG tablet        carvedilol (COREG) 25 MG tablet  furosemide (LASIX) 20 MG tablet                Discharge Instructions:       Follow-up with neurology PCP renal cardiology   Wound care      Signed by: Laveda Norman, MD

## 2014-05-17 NOTE — Rehab PPS CMG ([facility]) (Signed)
[name]  MRN: [id number]  Account: [id number]  Session Start: 03/25/2014 12:00:00 AM  Session Stop: 03/25/2014 12:00:00 AM    PPS CMG Coordinator  Inpatient Rehabilitation Discharge    Mode of Locomotion: Both walking and wheelchair.    Discharge Against Medical Advice:  No.  Discharge Information: Patient Discharged Alive:  Yes  Discharge Destination/Living Setting: Home with Home Health Services      Impairment Group: 09 Cardiac    Comorbidities:  Rank Code      Description    1    N18.3     Chronic kidney disease, stage 3 (moderate)  2    L97.319   Non-pressure chronic ulcer of right ankle with                 unspecified severity  3    L97.519   Non-pressure chronic ulcer of other part of                 right foot with unspecified severity  4    E11.22    Type 2 diabetes mellitus with diabetic chronic                 kidney disease  5    D63.1     Anemia in chronic kidney disease  6    J44.9     Chronic obstructive pulmonary disease,                 unspecified  7    I70.235   Atherosclerosis of native arteries of right leg                 with ulceration of other part of foot  8    E87.2     Acidosis  9    R44.3     Hallucinations, unspecified  10   E87.5     Hyperkalemia  11   Z48.812   Encounter for surgical aftercare following                 surgery on the circulatory system  12   E46.      Unspecified protein-calorie malnutrition  13   R60.0     Localized edema  14   N28.9     Disorder of kidney and ureter, unspecified  15   D50.0     Iron deficiency anemia secondary to blood loss                 (chronic)  16   Z95.1     Presence of aortocoronary bypass graft  17   I12.9     Hypertensive chronic kidney disease with stage                 1 through stage 4 chronic kidney disease, or                 unspecified chronic kidney disease  18   E78.5     Hyperlipidemia, unspecified  19   I35.0     Nonrheumatic aortic (valve) stenosis  20   Z95.5     Presence of coronary angioplasty implant and                  graft  21   Z87.891   Personal history of nicotine dependence  22   I21.4     Non-ST elevation (NSTEMI) myocardial infarction      ********************************  Complications:  Rank Code  Description    1    E87.2     Acidosis  2    R44.3     Hallucinations, unspecified  3    E87.5     Hyperkalemia      ********************************  PAI Bladder Accidents: 0  - Accidents.  Patient used medications/device this  shift.  03/17/2014 9:30:00 PM  Bladder Score = 6. Patient has not had an accident, but uses a  device/medication.  PAI Bowel Accident: 0  - Accidents.  Patient used medications/device this shift.   03/24/2014 11:55:00 PM  Bowel Score = 6. Patient has no accidents, but uses a device/medications.      MEDICAL NEEDS  Swallowing Status: Swallowing Status: Regular Food: solids and liquids swallowed  safely without supervision or modified food consistencies.    QUALITY INDICATORS  Pressure Ulcers - Discharge: Unhealed Pressure Ulcer(s) Present on Discharge:  No.  Number of Stage 1 pressure ulcers present on Admission and have completely  closed upon Discharge: 0  Number of Stage 2 pressure ulcers present on Admission and have completely  closed upon Discharge: 0  Number of Stage 3 pressure ulcers present on Admission and have completely  closed upon Discharge: 0  Number of Stage 4 pressure ulcers present on Admission and have completely  closed upon Discharge: 0    O0250.Influenza Vaccine - Discharge: Received in this facility for this year's  influenza vaccination season:  No.  Influenza Vaccine Not Received Due To: Offered and declined.    Signed by: Roselyn Bering, DPT, PPS Coordinator 03/25/2014 4:00:00 PM

## 2014-05-18 ENCOUNTER — Ambulatory Visit: Payer: Medicare Other

## 2014-05-22 NOTE — Rehab Progress Note ([facility]) (Signed)
[name]  MRN: [id number]  Account: [id number]  Session Start: 03/15/2014 12:00:00 AM  Session Stop: 03/15/2014 12:00:00 AM    Case Management  Inpatient Rehabilitation Team Conference    Conference Date/Time: 03/15/2014 11:00:00 AM    Demographics            Age: 72Y            Gender: Female    Admission Date: 03/14/2014 6:35:00 PM  Diagnosis: Debility secondary to NSTEMI and multivessel CAD S/P CABG  Comorbidities:    VITAL SIGNS  Blood Pressure: 131/58 mmHg  Temperature:  degrees  Pulse: 84 beats per minute  Respirations: 18 breaths per minute  Pain: 6/10    WEIGHT and NUTRITION  Admission Weight: 156 pounds; Current Weight: 156pounds  Weight Change since Admit: Patient has had no weight change since admission.  Food Consistency: Regular  Liquid Consistency:Thin    Plan Of Care  Team Members Attending : Dr. Selena Batten M.D., Jena Gauss CM, [facility] RN, Arnoldo Hooker PT, Fransisco Beau OT  Anticipated Discharge Date/Estimated Length of Stay: 2 weeks  Anticipated Discharge Destination: Community discharge with assistance  Other: Evals In Progress    The following is a list of patient problems that have been identified by the  interdisciplinary team:    Problem: Impaired Endocrine/Metabolic Function  Endocrine Status Update: teaching in progress  Team Identified Barrier to Discharge: Yes  Other Endocrine Intervention 1 - Text: education  Other Endocrine Intervention 1 - Status: Active  Endocrine: Primary Team Goal/Status: Patient will demonstrate understanding of  DM management to include adm of insulin, s/s of hypo/ hyper glycemia. / Active    Problem: Impaired Mobility  Mobility: Primary Team Goal/Status: Patient will demonstrate understanding of  correct movements with sternal precautions for patient to minimize accidents. /    Problem: Impaired Self-care Mgmt/ADL/IADL  Self Care/ADL/IADL Status Update: Pt is CGA -Min A for ADL for balance due to  decreased ability to tolerate WB on R foot due to  ulcer.  Team Identified Barrier to Discharge: Yes  Interventions:  Adaptive equipment training: Active  Compensatory strategies: Active  Encourage patient to participate in activities of daily living: Active  Self Care: Primary Team Goal/Status: Pt will be Mod I in ADL and functional  mobility and adhere to sternal precautions 100% of the time with no cuing. /  Active    Problem: Impaired Skin/Wound Mgmt  Skin Wound Status Update: Rt foot wound and dependent in care/mgt  Team Identified Barrier to Discharge: Yes  Interventions:  Turning schedule: Active  Other Skin Wound Intervention 1 - Text: education  Other Skin Wound Intervention 1 - Status: Active  Skin Wound: Primary Team Goal/Status: Patient and caregiver will be independent  of wound care / Active    Fall Risk Assessment: TOTAL SCORE is <=10:  Low Risk.    Functional Measures  Eating: 7  Grooming: 5  Bathing: 2  Upper Body Dressing: 5  Lower Body Dressing: 4  Toileting: 4  Bladder Level of Assistance: 5  Bowel Level of Assistance: 6  Bed/Chair/Wheelchair Transfer: 4  Toilet Transfer: 4  Tub Transfer:  Engineer, structural: 4  Wheelchair:  Walk:  Stairs:  Comprehension: 7  Expression: 7  Social Interaction: 7  Problem Solving: 7  Memory: 7  Bladder Frequency of Accidents - Admission Period: Assessment in Progress  Bowel Frequency of Accidents - Admission Period: Assessment in Progress    KEY TO FIM LEVELS (General)  7 The  patient completes the task or skill independently in a timely manner.  6 The patient completes the task or skill without a helper but uses an assistive  device, has mild difficulty or needs extra time.  5 The patient performs 100% of the task with supervision.  4 The patient performs 75-90% of the effort to complete the task or skill.  3 The patient performs 50-74% of the effort to complete the task or skill.  2 The patient performs 25-49% of the effort to complete the task or skill.  1 The patient performs 0-24% of the effort to complete the  task or skill.  0 The task or skill does not occur because it is unsafe, contraindicated or the  patient refused.    Walk/Wheelchair Functional Measures scoring parameters:  7 Patient walks/propels a minimum of 150 feet independently.  6 Patient walks/propels a minimum of 150 feet without a helper but with a device  or safety concerns.  5 Patient walks/propels 50 feet independently OR walks a minimum of 150 feet  with supervision.  4 Patient performs 75-90% of the effort to walk/propel a minimum of 150 feet.  3 Patient performs 50-74% of the effort to walk/propel a minimum of 150 feet.  2 Patient walks/propels a minimum of 50 feet with any amount of assistance from  a helper.  1 Patient walks/propels less than 50 feet with any amount of assistance or needs  two helpers for any distance.    Stair Functional Measures scoring parameters:  7 Patient goes up and down 12 to 14 stairs independently.  6 Patient goes up and down 12 to 14 stairs with a device but without a helper.  5 Patient goes up and down 4 to 6 stairs independently OR 12 to 14 stairs with  supervision.  4 Patient performs 75-90% of the effort to go up and down 12 to 14 stairs.  3 Patient performs 50-74% of the effort to go up and down 12 to 14 stairs.  2 Patient goes up and down 4 to 6 stairs with any amount of assistance from a  helper.  1 Patient goes up and down less than 4 to 6 stairs, needs two helpers for any  number of stairs or uses a stair lift.    Comments:    Signed by: Antonietta Breach, RN 03/15/2014 1:43:00 PM    Physician CoSigned By: Antony Contras 05/22/2014 00:59:33

## 2014-05-22 NOTE — Rehab Progress Note ([facility]) (Signed)
[name]  MRN: [id number]  Account: [id number]  Session Start: 03/22/2014 12:00:00 AM  Session Stop: 03/22/2014 12:00:00 AM    Case Management  Inpatient Rehabilitation Team Conference    Conference Date/Time: 03/22/2014 11:00:00 AM    Demographics            Age: 63Y            Gender: Female    Admission Date: 03/14/2014 6:35:00 PM  Diagnosis: Cardiac debility  Comorbidities:    VITAL SIGNS  Blood Pressure: 171/66 mmHg  Temperature:  degrees  Pulse: 79 beats per minute  Respirations: 18 breaths per minute  Pain: 6/10    WEIGHT and NUTRITION  Admission Weight: 156 pounds; Current Weight: 156pounds  Weight Change since Admit: Patient has had no weight change since admission.  Food Consistency: Regular  Liquid Consistency:Thin    Plan Of Care  Team Members Attending : Dr. Selena Batten M.D., Jena Gauss CM, [facility] RN, Arnoldo Hooker PT, Fransisco Beau OT, Dr. Monica Martinez Psych Phd.  Anticipated Discharge Date/Estimated Length of Stay: 1/8  Anticipated Discharge Destination: Community discharge with assistance  Discharge Plan : Home with [facility] and OP wound care  Medical Necessity Expected Level Rationale: Her pain control will be optimized.  Intensity and Duration: an average of 3 hours/5 days per week  Medical Supervision and 24 Hour Rehab Nursing: x  Physical Therapy: x  PT Intensity/Duration: 60-120 minutes per day for 5-6 days per week  Occupational Therapy: x  OT Intensity/Duration: 60-120 minutes per day for 5-6 days per week  Therapeutic Recreation: x  Psychology: x  Registered Dietician: x  Other:    The following is a list of patient problems that have been identified by the  interdisciplinary team:    Problem: Impaired Endocrine/Metabolic Function  Endocrine Status Update: teaching in progress  Team Identified Barrier to Discharge: Yes  Other Endocrine Intervention 1 - Text: education  Other Endocrine Intervention 1 - Status: Active  Endocrine: Primary Team Goal/Status: Patient will demonstrate understanding of  DM  management to include adm of insulin, s/s of hypo/ hyper glycemia. / Active    Problem: Impaired Mobility  Mobility Status Update: Pt requires supervision to CGA for safety with mobility,  using RW.  Team Identified Barrier to Discharge: Yes  Interventions:  Transfer training: Active  Gait training: Active  Wheelchair propulsion and management: Active  Mobility: Primary Team Goal/Status: Patient will perform household functional  mobility mod I using LRAD in order to ensure safe d/c home. / Active    Problem: Impaired Psychosocial Skills/Behavior  PsychoSocial/Behavior Status Update: Working on coping and adjustment issues  Team Identified Barrier to Discharge: No  Interventions:  Supportive counseling: Active  Coping skills training: Active  Encourage verbalization of feelings: Active  Encourage interpersonal appropriateness and communication: Active  PsychoSocial: Primary Team Goal/Status: Pt will demonstrate coping skills for  adjusting to post-cardiac surgery recovery as reflected in positive expectations  for continuing to increase levels of functional independence / Active    Problem: Impaired Self-care Mgmt/ADL/IADL  Self Care/ADL/IADL Status Update: Pt requires supervision for self-care routine  due to difficulty adhering to sternal precautions.  Team Identified Barrier to Discharge: Yes  Interventions:  Adaptive equipment training: Active  Compensatory strategies: Active  Encourage patient to participate in activities of daily living: Active  Self Care: Primary Team Goal/Status: Pt will be Mod I in ADL and functional  mobility and adhere to sternal precautions 100% of the time with no cuing. /  Active    Problem: Impaired Skin/Wound Mgmt  Skin Wound Status Update: wound with yellow drainage and odor; wound care RN  consulted  Team Identified Barrier to Discharge: Yes  Interventions:  Turning schedule: Active  Other Skin Wound Intervention 1 - Text: education  Other Skin Wound Intervention 1 - Status:  Active  Skin Wound: Primary Team Goal/Status: Patient and caregiver will be independent  of wound care / Active    Fall Risk Assessment: TOTAL SCORE >10: High Risk    Functional Measures  Eating: 7  Grooming: 7  Bathing: 5  Upper Body Dressing: 6  Lower Body Dressing: 5  Toileting: 5  Bladder Level of Assistance: 7  Bowel Level of Assistance: 7  Bed/Chair/Wheelchair Transfer: 5  Toilet Transfer: 5  Tub Transfer:  Engineer, structural: 5  Wheelchair: 5  Walk: 2  Stairs: 1  Comprehension: 7  Expression: 7  Social Interaction: 7  Problem Solving: 7  Memory: 7  Bladder Frequency of Accidents - Admission Period: IRF Score = 6 - No accidents;  uses device  Bowel Frequency of Accidents - Admission Period: IRF Score = 6 - No accidents;  uses device    KEY TO FIM LEVELS (General)  7 The patient completes the task or skill independently in a timely manner.  6 The patient completes the task or skill without a helper but uses an assistive  device, has mild difficulty or needs extra time.  5 The patient performs 100% of the task with supervision.  4 The patient performs 75-90% of the effort to complete the task or skill.  3 The patient performs 50-74% of the effort to complete the task or skill.  2 The patient performs 25-49% of the effort to complete the task or skill.  1 The patient performs 0-24% of the effort to complete the task or skill.  0 The task or skill does not occur because it is unsafe, contraindicated or the  patient refused.    Walk/Wheelchair Functional Measures scoring parameters:  7 Patient walks/propels a minimum of 150 feet independently.  6 Patient walks/propels a minimum of 150 feet without a helper but with a device  or safety concerns.  5 Patient walks/propels 50 feet independently OR walks a minimum of 150 feet  with supervision.  4 Patient performs 75-90% of the effort to walk/propel a minimum of 150 feet.  3 Patient performs 50-74% of the effort to walk/propel a minimum of 150 feet.  2 Patient  walks/propels a minimum of 50 feet with any amount of assistance from  a helper.  1 Patient walks/propels less than 50 feet with any amount of assistance or needs  two helpers for any distance.    Stair Functional Measures scoring parameters:  7 Patient goes up and down 12 to 14 stairs independently.  6 Patient goes up and down 12 to 14 stairs with a device but without a helper.  5 Patient goes up and down 4 to 6 stairs independently OR 12 to 14 stairs with  supervision.  4 Patient performs 75-90% of the effort to go up and down 12 to 14 stairs.  3 Patient performs 50-74% of the effort to go up and down 12 to 14 stairs.  2 Patient goes up and down 4 to 6 stairs with any amount of assistance from a  helper.  1 Patient goes up and down less than 4 to 6 stairs, needs two helpers for any  number of stairs or uses a  stair lift.    Comments:    Signed by: Antonietta Breach, RN 03/22/2014 1:40:00 PM    Physician CoSigned By: Antony Contras 05/22/2014 00:59:50

## 2014-05-23 NOTE — Anesthesia Postprocedure Evaluation (Signed)
Anesthesia Post Evaluation    Patient: [name]    Procedures performed: TEE    Anesthesia type: General TIVA    Patient location:PACU    Last vitals:   Filed Vitals:    05/16/14 2000   BP: 148/63   Pulse:    Temp: 36.3 C (97.3 F)   Resp: 18   SpO2: 100%       Post pain: Patient not complaining of pain, continue current therapy      Mental Status:awake and alert     Respiratory Function: tolerating room air    Cardiovascular: stable    Nausea/Vomiting: patient not complaining of nausea or vomiting    Hydration Status: adequate    Post assessment: no apparent anesthetic complications

## 2014-05-25 ENCOUNTER — Ambulatory Visit: Payer: Medicare Other

## 2014-05-30 ENCOUNTER — Inpatient Hospital Stay (INDEPENDENT_AMBULATORY_CARE_PROVIDER_SITE_OTHER): Payer: Medicare Other | Admitting: Neurology

## 2014-06-01 ENCOUNTER — Inpatient Hospital Stay (INDEPENDENT_AMBULATORY_CARE_PROVIDER_SITE_OTHER): Payer: Medicare Other | Admitting: Neurology

## 2014-06-02 ENCOUNTER — Encounter: Payer: Self-pay | Admitting: Specialist

## 2014-06-02 ENCOUNTER — Inpatient Hospital Stay (INDEPENDENT_AMBULATORY_CARE_PROVIDER_SITE_OTHER): Payer: Medicare Other | Admitting: Neurology

## 2016-10-20 ENCOUNTER — Emergency Department (HOSPITAL_COMMUNITY)
Admission: EM | Admit: 2016-10-20 | Discharge: 2016-10-20 | Disposition: A | Discharge status: Home / Self Care | Payer: Medicare Other | Attending: Emergency Medicine | Admitting: Emergency Medicine

## 2016-10-20 ENCOUNTER — Encounter (HOSPITAL_COMMUNITY): Payer: Self-pay | Admitting: Emergency Medicine

## 2016-10-20 DIAGNOSIS — N289 Disorder of kidney and ureter, unspecified: Secondary | ICD-10-CM

## 2016-10-20 DIAGNOSIS — E119 Type 2 diabetes mellitus without complications: Secondary | ICD-10-CM | POA: Insufficient documentation

## 2016-10-20 DIAGNOSIS — Z880 Allergy status to penicillin: Secondary | ICD-10-CM | POA: Diagnosis not present

## 2016-10-20 DIAGNOSIS — Z8673 Personal history of transient ischemic attack (TIA), and cerebral infarction without residual deficits: Secondary | ICD-10-CM | POA: Insufficient documentation

## 2016-10-20 DIAGNOSIS — N390 Urinary tract infection, site not specified: Secondary | ICD-10-CM | POA: Diagnosis not present

## 2016-10-20 DIAGNOSIS — R202 Paresthesia of skin: Secondary | ICD-10-CM | POA: Diagnosis not present

## 2016-10-20 DIAGNOSIS — R200 Anesthesia of skin: Secondary | ICD-10-CM | POA: Diagnosis present

## 2016-10-20 HISTORY — DX: Cerebral infarction, unspecified: I63.9

## 2016-10-20 HISTORY — DX: Type 2 diabetes mellitus without complications: E11.9

## 2016-10-20 LAB — URINALYSIS, MICROSCOPIC (REFLEX)

## 2016-10-20 LAB — CBC WITH DIFFERENTIAL/PLATELET
BASOS PCT: 0 %
Basophils Absolute: 0 10*3/uL (ref 0.0–0.1)
EOS ABS: 0.4 10*3/uL (ref 0.0–0.7)
Eosinophils Relative: 11 %
HEMATOCRIT: 31.7 % — AB (ref 36.0–46.0)
Hemoglobin: 10.1 g/dL — ABNORMAL LOW (ref 12.0–15.0)
LYMPHS ABS: 0.8 10*3/uL (ref 0.7–4.0)
Lymphocytes Relative: 19 %
MCH: 27.7 pg (ref 26.0–34.0)
MCHC: 31.9 g/dL (ref 30.0–36.0)
MCV: 87.1 fL (ref 78.0–100.0)
MONO ABS: 0.3 10*3/uL (ref 0.1–1.0)
MONOS PCT: 8 %
Neutro Abs: 2.5 10*3/uL (ref 1.7–7.7)
Neutrophils Relative %: 62 %
Platelets: 235 10*3/uL (ref 150–400)
RBC: 3.64 MIL/uL — ABNORMAL LOW (ref 3.87–5.11)
RDW: 14.5 % (ref 11.5–15.5)
WBC: 4 10*3/uL (ref 4.0–10.5)

## 2016-10-20 LAB — URINALYSIS, ROUTINE W REFLEX MICROSCOPIC
BILIRUBIN URINE: NEGATIVE
Glucose, UA: NEGATIVE mg/dL
HGB URINE DIPSTICK: NEGATIVE
KETONES UR: NEGATIVE mg/dL
NITRITE: POSITIVE — AB
PH: 6 (ref 5.0–8.0)
Protein, ur: 100 mg/dL — AB
Specific Gravity, Urine: 1.015 (ref 1.005–1.030)

## 2016-10-20 LAB — BASIC METABOLIC PANEL
ANION GAP: 7 (ref 5–15)
BUN: 42 mg/dL — ABNORMAL HIGH (ref 6–20)
CHLORIDE: 108 mmol/L (ref 101–111)
CO2: 21 mmol/L — AB (ref 22–32)
Calcium: 8.9 mg/dL (ref 8.9–10.3)
Creatinine, Ser: 2.28 mg/dL — ABNORMAL HIGH (ref 0.44–1.00)
GFR calc non Af Amer: 21 mL/min — ABNORMAL LOW (ref >60)
GFR, EST AFRICAN AMERICAN: 24 mL/min — AB (ref >60)
Glucose, Bld: 128 mg/dL — ABNORMAL HIGH (ref 65–99)
Potassium: 5.3 mmol/L — ABNORMAL HIGH (ref 3.5–5.1)
SODIUM: 136 mmol/L (ref 135–145)

## 2016-10-20 MED ORDER — CEPHALEXIN 500 MG PO CAPS: 500.0000 mg | ORAL_CAPSULE | Freq: Four times a day (QID) | ORAL | 0 refills | Status: DC

## 2016-10-20 MED ORDER — CEPHALEXIN 250 MG PO CAPS
500.0000 mg | ORAL_CAPSULE | Freq: Once | ORAL | Status: AC
  Administered 2016-10-20: 500 mg via ORAL
  Filled 2016-10-20: qty 2

## 2016-10-20 NOTE — Discharge Instructions (Signed)
Get plenty of rest and drink a lot of fluids.  When you see her doctor tomorrow. Have her check your blood pressure, and luckily her medication list. She may need to change your blood pressure medicine. If your blood pressure is still high.  We are doing a urine culture, which can help to guide treatment. It should be back in 2 or 3 days.

## 2016-10-20 NOTE — ED Triage Notes (Signed)
Pt from home via EMS with c/o numbness to B/L hands x 1 week and left foot onset last night.

## 2016-10-20 NOTE — ED Notes (Signed)
RN ordered diet tray

## 2016-10-20 NOTE — ED Provider Notes (Signed)
[place] DEPT Provider Note   CSN: [id number] Arrival date & time: 10/20/16  1110     History   Chief Complaint Chief Complaint  Patient presents with  . Numbness    HPI Rebecca Benton is a 66 y.o. female.  She presents for evaluation of numbness in her right leg, and both hands. Onset of symptoms several days ago. She denies neck or back pain. She is taking her usual medications. She lives with her daughter, and he came here by EMS. She denies headache, chest pain, cough, dizziness or focal weakness. There are no other known modifying factors.  HPI  Past Medical History:  Diagnosis Date  . Diabetes mellitus without complication ([place])   . Stroke [facility])     There are no active problems to display for this patient.   Past Surgical History:  Procedure Laterality Date  . BELOW KNEE LEG AMPUTATION Right   . CORONARY ARTERY BYPASS GRAFT    . TUBAL LIGATION      OB History    No data available       Home Medications    Prior to Admission medications   Medication Sig Start Date End Date Taking? Authorizing Provider  albuterol (PROVENTIL HFA;VENTOLIN HFA) 108 (90 Base) MCG/ACT inhaler Inhale 2 puffs into the lungs every 6 (six) hours as needed for wheezing or shortness of breath.   Yes [provider]  ALPRAZolam Duanne Moron) 1 MG tablet Take 1 mg by mouth at bedtime as needed for anxiety.   Yes [provider]  AMLODIPINE BESYLATE PO Take 1 tablet by mouth 4 (four) times daily.   Yes [provider]  ATORVASTATIN CALCIUM PO Take 1 tablet by mouth at bedtime.   Yes [provider]  CARVEDILOL PO Take 1 tablet by mouth 2 (two) times daily.   Yes [provider]  Cyanocobalamin (VITAMIN B 12 PO) Take 1 tablet by mouth daily. Gummy tablet   Yes [provider]  diphenhydrAMINE (BENADRYL) 25 MG tablet Take 12.5 mg by mouth every 6 (six) hours as needed for allergies.   Yes [provider]  Docusate Sodium (COLACE PO)  Take 1 tablet by mouth as needed (Constipation).   Yes [provider]  HYDRALAZINE HCL PO Take 1 tablet by mouth 2 (two) times daily.   Yes [provider]  LISINOPRIL PO Take 1 tablet by mouth 4 (four) times daily.   Yes [provider]  oxyCODONE-acetaminophen (PERCOCET) 10-325 MG tablet Take 1 tablet by mouth every 6 (six) hours as needed for pain.   Yes [provider]  PRESCRIPTION MEDICATION Apply 1 application topically as needed (Itching). Medicated Itching Cream   Yes [provider]  cephALEXin (KEFLEX) 500 MG capsule Take 1 capsule (500 mg total) by mouth 4 (four) times daily. 10/20/16   Daleen Bo, MD    Family History No family history on file.  Social History Social History  Substance Use Topics  . Smoking status: Never Smoker  . Smokeless tobacco: Never Used  . Alcohol use No     Allergies   Penicillins and Tape   Review of Systems Review of Systems  All other systems reviewed and are negative.    Physical Exam Updated Vital Signs BP (!) 138/48 (BP Location: Right Arm)   Pulse 73   Temp 98 F (36.7 C)   Resp 20   Ht 5' 1.5" (1.562 m)   Wt 53.1 kg (117 lb)   SpO2 100%  BMI 21.75 kg/m   Physical Exam  Constitutional: She is oriented to person, place, and time. She appears well-developed and well-nourished. No distress.  HENT:  Head: Normocephalic and atraumatic.  Eyes: Pupils are equal, round, and reactive to light. Conjunctivae and EOM are normal.  Neck: Normal range of motion and phonation normal. Neck supple.  Cardiovascular: Normal rate and regular rhythm.   Pulmonary/Chest: Effort normal and breath sounds normal. She exhibits no tenderness.  Abdominal: Soft. She exhibits no distension. There is no tenderness. There is no guarding.  Musculoskeletal: Normal range of motion.  Neurological: She is alert and oriented to person, place, and time. She exhibits normal muscle tone.  Skin: Skin is warm and  dry.  Psychiatric: She has a normal mood and affect. Her behavior is normal. Judgment and thought content normal.  Nursing note and vitals reviewed.    ED Treatments / Results  Labs (all labs ordered are listed, but only abnormal results are displayed) Labs Reviewed  BASIC METABOLIC PANEL - Abnormal; Notable for the following:       Result Value   Potassium 5.3 (*)    CO2 21 (*)    Glucose, Bld 128 (*)    BUN 42 (*)    Creatinine, Ser 2.28 (*)    GFR calc non Af Amer 21 (*)    GFR calc Af Amer 24 (*)    All other components within normal limits  CBC WITH DIFFERENTIAL/PLATELET - Abnormal; Notable for the following:    RBC 3.64 (*)    Hemoglobin 10.1 (*)    HCT 31.7 (*)    All other components within normal limits  URINALYSIS, ROUTINE W REFLEX MICROSCOPIC - Abnormal; Notable for the following:    Protein, ur 100 (*)    Nitrite POSITIVE (*)    Leukocytes, UA TRACE (*)    All other components within normal limits  URINALYSIS, MICROSCOPIC (REFLEX) - Abnormal; Notable for the following:    Bacteria, UA MANY (*)    Squamous Epithelial / LPF 6-30 (*)    All other components within normal limits  URINE CULTURE   BUN  Date Value Ref Range Status  10/20/2016 42 (H) 6 - 20 mg/dL Final   Creatinine, Ser  Date Value Ref Range Status  10/20/2016 2.28 (H) 0.44 - 1.00 mg/dL Final     EKG  EKG Interpretation None       Radiology No results found.  Procedures Procedures (including critical care time)  Medications Ordered in ED Medications  cephALEXin (KEFLEX) capsule 500 mg (not administered)     Initial Impression / Assessment and Plan / ED Course  I have reviewed the triage vital signs and the nursing notes.  Pertinent labs & imaging results that were available during my care of the patient were reviewed by me and considered in my medical decision making (see chart for details).  Clinical Course as of Oct 20 1537  Nancy Fetter Oct 20, 2016  1321 High Potassium: (!) 5.3  [EW]  1321 High Creatinine: (!) 2.28 [EW]  1321 Abnormal, consistent with UTI Nitrite: (!) POSITIVE [EW]  1321 Low Hemoglobin: (!) 10.1 [EW]    Clinical Course User Index [EW] Daleen Bo, MD     Patient Vitals for the past 24 hrs:  BP Temp Pulse Resp SpO2 Height Weight  10/20/16 1458 (!) 138/48 - 73 20 100 % - -  10/20/16 1224 (!) 204/66 - 66 20 99 % - -  10/20/16 1200 (!) 168/59 - Marland Kitchen)  59 13 100 % - -  10/20/16 1116 - - - - - 5' 1.5" (1.562 m) 53.1 kg (117 lb)  10/20/16 1115 (!) 168/53 98 F (36.7 C) 60 18 100 % - -   We attempted to contact patient's pharmacy to get a medicine list, because the family did not have it with them. The pharmacy is closed.  3:38 PM Reevaluation with update and discussion. After initial assessment and treatment, an updated evaluation reveals She remains comfortable. Findings discussed with patient and family members, all questions answered. Terianne Thaker L    Final Clinical Impressions(s) / ED Diagnoses   Final diagnoses:  Urinary tract infection without hematuria, site unspecified  Renal insufficiency  Paresthesia   Nonspecific symptoms, with reassuring findings. Likely urinary tract infection. Mild blood pressure elevation, on multiple medications. Doubt hypertensive urgency, metabolic instability or serious bacterial infection.  Nursing Notes Reviewed/ Care Coordinated Applicable Imaging Reviewed Interpretation of Laboratory Data incorporated into ED treatment  The patient appears reasonably screened and/or stabilized for discharge and I doubt any other medical condition or other [facility] requiring further screening, evaluation, or treatment in the ED at this time prior to discharge.  Plan: Home Medications- continue current medications; Home Treatments- rest, fluids; return here if the recommended treatment, does not improve the symptoms; Recommended follow up- PCP follow-up, tomorrow, as scheduled   New Prescriptions New Prescriptions    CEPHALEXIN (KEFLEX) 500 MG CAPSULE    Take 1 capsule (500 mg total) by mouth 4 (four) times daily.     Daleen Bo, MD 10/20/16 1539

## 2016-10-20 NOTE — ED Notes (Signed)
Family at bedside. 

## 2016-10-20 NOTE — ED Notes (Addendum)
Pt requesting to get her home medications here. Pt does not have her home meds with her.  Dr Eulis Foster made aware.

## 2016-10-22 LAB — URINE CULTURE: Culture: >=100000 — AB

## 2016-10-23 ENCOUNTER — Telehealth: Payer: Self-pay | Admitting: Emergency Medicine

## 2016-10-23 NOTE — Telephone Encounter (Signed)
Post ED Visit - Positive Culture Follow-up  Culture report reviewed by antimicrobial stewardship pharmacist:  []  Elenor Quinones, Pharm.D. []  Heide Guile, Pharm.D., BCPS AQ-ID []  Parks Neptune, Pharm.D., BCPS []  Alycia Rossetti, Pharm.D., BCPS []  [place], Pharm.D., BCPS, AAHIVP []  Legrand Como, Pharm.D., BCPS, AAHIVP []  Salome Arnt, PharmD, BCPS []  Dimitri Ped, PharmD, BCPS []  Vincenza Hews, PharmD, BCPS Nida Boatman PharmD  Positive urine culture Treated with cephalexin, organism sensitive to the same and no further patient follow-up is required at this time.  Hazle Nordmann 10/23/2016, 1:37 PM

## 2017-04-29 ENCOUNTER — Inpatient Hospital Stay (HOSPITAL_COMMUNITY)
Admission: EM | Admit: 2017-04-29 | Discharge: 2017-05-02 | DRG: 202 | Disposition: A | Discharge status: Home / Self Care | Payer: Medicare Other | Attending: Internal Medicine | Admitting: Internal Medicine

## 2017-04-29 ENCOUNTER — Other Ambulatory Visit: Payer: Self-pay

## 2017-04-29 ENCOUNTER — Emergency Department (HOSPITAL_COMMUNITY): Payer: Medicare Other

## 2017-04-29 ENCOUNTER — Encounter (HOSPITAL_COMMUNITY): Payer: Self-pay

## 2017-04-29 DIAGNOSIS — J4551 Severe persistent asthma with (acute) exacerbation: Secondary | ICD-10-CM

## 2017-04-29 DIAGNOSIS — J441 Chronic obstructive pulmonary disease with (acute) exacerbation: Secondary | ICD-10-CM

## 2017-04-29 DIAGNOSIS — Z951 Presence of aortocoronary bypass graft: Secondary | ICD-10-CM

## 2017-04-29 DIAGNOSIS — Z8673 Personal history of transient ischemic attack (TIA), and cerebral infarction without residual deficits: Secondary | ICD-10-CM

## 2017-04-29 DIAGNOSIS — J45901 Unspecified asthma with (acute) exacerbation: Secondary | ICD-10-CM | POA: Diagnosis present

## 2017-04-29 DIAGNOSIS — E1122 Type 2 diabetes mellitus with diabetic chronic kidney disease: Secondary | ICD-10-CM | POA: Diagnosis present

## 2017-04-29 DIAGNOSIS — E875 Hyperkalemia: Secondary | ICD-10-CM

## 2017-04-29 DIAGNOSIS — J069 Acute upper respiratory infection, unspecified: Secondary | ICD-10-CM

## 2017-04-29 DIAGNOSIS — Z91048 Other nonmedicinal substance allergy status: Secondary | ICD-10-CM

## 2017-04-29 DIAGNOSIS — N390 Urinary tract infection, site not specified: Secondary | ICD-10-CM | POA: Diagnosis not present

## 2017-04-29 DIAGNOSIS — Z955 Presence of coronary angioplasty implant and graft: Secondary | ICD-10-CM

## 2017-04-29 DIAGNOSIS — Z79899 Other long term (current) drug therapy: Secondary | ICD-10-CM

## 2017-04-29 DIAGNOSIS — Z87891 Personal history of nicotine dependence: Secondary | ICD-10-CM

## 2017-04-29 DIAGNOSIS — N189 Chronic kidney disease, unspecified: Secondary | ICD-10-CM

## 2017-04-29 DIAGNOSIS — N184 Chronic kidney disease, stage 4 (severe): Secondary | ICD-10-CM

## 2017-04-29 DIAGNOSIS — Z89511 Acquired absence of right leg below knee: Secondary | ICD-10-CM

## 2017-04-29 DIAGNOSIS — R300 Dysuria: Secondary | ICD-10-CM

## 2017-04-29 DIAGNOSIS — B348 Other viral infections of unspecified site: Secondary | ICD-10-CM

## 2017-04-29 DIAGNOSIS — I129 Hypertensive chronic kidney disease with stage 1 through stage 4 chronic kidney disease, or unspecified chronic kidney disease: Secondary | ICD-10-CM | POA: Diagnosis present

## 2017-04-29 DIAGNOSIS — Z7902 Long term (current) use of antithrombotics/antiplatelets: Secondary | ICD-10-CM

## 2017-04-29 DIAGNOSIS — N179 Acute kidney failure, unspecified: Secondary | ICD-10-CM | POA: Diagnosis present

## 2017-04-29 DIAGNOSIS — I2510 Atherosclerotic heart disease of native coronary artery without angina pectoris: Secondary | ICD-10-CM | POA: Diagnosis present

## 2017-04-29 DIAGNOSIS — Z880 Allergy status to penicillin: Secondary | ICD-10-CM

## 2017-04-29 DIAGNOSIS — B9789 Other viral agents as the cause of diseases classified elsewhere: Secondary | ICD-10-CM | POA: Diagnosis present

## 2017-04-29 HISTORY — DX: Unspecified asthma with (acute) exacerbation: J45.901

## 2017-04-29 LAB — BASIC METABOLIC PANEL
Anion gap: 9 (ref 5–15)
BUN: 32 mg/dL — ABNORMAL HIGH (ref 6–20)
CHLORIDE: 109 mmol/L (ref 101–111)
CO2: 19 mmol/L — ABNORMAL LOW (ref 22–32)
CREATININE: 2.01 mg/dL — AB (ref 0.44–1.00)
Calcium: 8.9 mg/dL (ref 8.9–10.3)
GFR calc non Af Amer: 24 mL/min — ABNORMAL LOW (ref >60)
GFR, EST AFRICAN AMERICAN: 28 mL/min — AB (ref >60)
Glucose, Bld: 194 mg/dL — ABNORMAL HIGH (ref 65–99)
Potassium: 5.5 mmol/L — ABNORMAL HIGH (ref 3.5–5.1)
SODIUM: 137 mmol/L (ref 135–145)

## 2017-04-29 LAB — CBC WITH DIFFERENTIAL/PLATELET
BASOS ABS: 0 10*3/uL (ref 0.0–0.1)
BASOS PCT: 0 %
EOS ABS: 0.3 10*3/uL (ref 0.0–0.7)
Eosinophils Relative: 5 %
HCT: 34.4 % — ABNORMAL LOW (ref 36.0–46.0)
HEMOGLOBIN: 10.6 g/dL — AB (ref 12.0–15.0)
Lymphocytes Relative: 13 %
Lymphs Abs: 0.6 10*3/uL — ABNORMAL LOW (ref 0.7–4.0)
MCH: 27.7 pg (ref 26.0–34.0)
MCHC: 30.8 g/dL (ref 30.0–36.0)
MCV: 89.8 fL (ref 78.0–100.0)
MONOS PCT: 4 %
Monocytes Absolute: 0.2 10*3/uL (ref 0.1–1.0)
NEUTROS PCT: 78 %
Neutro Abs: 3.8 10*3/uL (ref 1.7–7.7)
Platelets: 187 10*3/uL (ref 150–400)
RBC: 3.83 MIL/uL — ABNORMAL LOW (ref 3.87–5.11)
RDW: 14 % (ref 11.5–15.5)
WBC: 4.9 10*3/uL (ref 4.0–10.5)

## 2017-04-29 LAB — CREATININE, SERUM
CREATININE: 2.13 mg/dL — AB (ref 0.44–1.00)
GFR calc Af Amer: 26 mL/min — ABNORMAL LOW (ref >60)
GFR calc non Af Amer: 23 mL/min — ABNORMAL LOW (ref >60)

## 2017-04-29 LAB — I-STAT TROPONIN, ED: TROPONIN I, POC: 0.01 ng/mL (ref 0.00–0.08)

## 2017-04-29 LAB — CBC
HCT: 35.2 % — ABNORMAL LOW (ref 36.0–46.0)
Hemoglobin: 10.7 g/dL — ABNORMAL LOW (ref 12.0–15.0)
MCH: 27.6 pg (ref 26.0–34.0)
MCHC: 30.4 g/dL (ref 30.0–36.0)
MCV: 90.7 fL (ref 78.0–100.0)
PLATELETS: 218 10*3/uL (ref 150–400)
RBC: 3.88 MIL/uL (ref 3.87–5.11)
RDW: 14.3 % (ref 11.5–15.5)
WBC: 3 10*3/uL — ABNORMAL LOW (ref 4.0–10.5)

## 2017-04-29 LAB — HEPATIC FUNCTION PANEL
ALK PHOS: 70 U/L (ref 38–126)
ALT: 10 U/L — AB (ref 14–54)
AST: 21 U/L (ref 15–41)
Albumin: 3.3 g/dL — ABNORMAL LOW (ref 3.5–5.0)
Total Bilirubin: 0.6 mg/dL (ref 0.3–1.2)
Total Protein: 7.2 g/dL (ref 6.5–8.1)

## 2017-04-29 LAB — HEMOGLOBIN A1C
Hgb A1c MFr Bld: 6.7 % — ABNORMAL HIGH (ref 4.8–5.6)
Mean Plasma Glucose: 145.59 mg/dL

## 2017-04-29 LAB — INFLUENZA PANEL BY PCR (TYPE A & B)
INFLAPCR: NEGATIVE
Influenza B By PCR: NEGATIVE

## 2017-04-29 LAB — I-STAT CG4 LACTIC ACID, ED: Lactic Acid, Venous: 0.43 mmol/L — ABNORMAL LOW (ref 0.5–1.9)

## 2017-04-29 MED ORDER — MAGNESIUM SULFATE 2 GM/50ML IV SOLN
2.0000 g | Freq: Once | INTRAVENOUS | Status: AC
  Administered 2017-04-29: 2 g via INTRAVENOUS
  Filled 2017-04-29: qty 50

## 2017-04-29 MED ORDER — POLYETHYLENE GLYCOL 3350 17 G PO PACK: 17.0000 g | PACK | Freq: Every day | ORAL | Status: DC | PRN

## 2017-04-29 MED ORDER — ATORVASTATIN CALCIUM 40 MG PO TABS
40.0000 mg | ORAL_TABLET | Freq: Every day | ORAL | Status: DC
  Administered 2017-04-29 – 2017-05-01 (×3): 40 mg via ORAL
  Filled 2017-04-29 (×3): qty 1

## 2017-04-29 MED ORDER — CARVEDILOL 25 MG PO TABS
25.0000 mg | ORAL_TABLET | Freq: Two times a day (BID) | ORAL | Status: DC
  Administered 2017-04-29 – 2017-05-02 (×6): 25 mg via ORAL
  Filled 2017-04-29 (×6): qty 1

## 2017-04-29 MED ORDER — DULOXETINE HCL 30 MG PO CPEP
30.0000 mg | ORAL_CAPSULE | Freq: Every day | ORAL | Status: DC
  Administered 2017-04-30 – 2017-05-02 (×3): 30 mg via ORAL
  Filled 2017-04-29 (×4): qty 1

## 2017-04-29 MED ORDER — IPRATROPIUM BROMIDE 0.02 % IN SOLN
0.5000 mg | Freq: Once | RESPIRATORY_TRACT | Status: AC
  Administered 2017-04-29: 0.5 mg via RESPIRATORY_TRACT
  Filled 2017-04-29: qty 2.5

## 2017-04-29 MED ORDER — IPRATROPIUM-ALBUTEROL 0.5-2.5 (3) MG/3ML IN SOLN
3.0000 mL | RESPIRATORY_TRACT | Status: DC
  Administered 2017-04-29 – 2017-04-30 (×4): 3 mL via RESPIRATORY_TRACT
  Filled 2017-04-29 (×5): qty 3

## 2017-04-29 MED ORDER — ONDANSETRON HCL 4 MG/2ML IJ SOLN: 4.0000 mg | Freq: Four times a day (QID) | INTRAMUSCULAR | Status: DC | PRN

## 2017-04-29 MED ORDER — ALBUTEROL SULFATE (2.5 MG/3ML) 0.083% IN NEBU
5.0000 mg | INHALATION_SOLUTION | Freq: Once | RESPIRATORY_TRACT | Status: AC
  Administered 2017-04-29: 5 mg via RESPIRATORY_TRACT
  Filled 2017-04-29: qty 6

## 2017-04-29 MED ORDER — METHYLPREDNISOLONE SODIUM SUCC 125 MG IJ SOLR
125.0000 mg | Freq: Once | INTRAMUSCULAR | Status: AC
Start: 2017-04-29 — End: 2017-04-29
  Administered 2017-04-29: 125 mg via INTRAVENOUS
  Filled 2017-04-29: qty 2

## 2017-04-29 MED ORDER — LORAZEPAM 2 MG/ML IJ SOLN
1.0000 mg | Freq: Once | INTRAMUSCULAR | Status: AC
  Administered 2017-04-29: 1 mg via INTRAVENOUS
  Filled 2017-04-29 (×2): qty 1

## 2017-04-29 MED ORDER — ISOSORBIDE MONONITRATE ER 30 MG PO TB24
30.0000 mg | ORAL_TABLET | Freq: Two times a day (BID) | ORAL | Status: DC
  Administered 2017-04-29 – 2017-05-02 (×6): 30 mg via ORAL
  Filled 2017-04-29 (×6): qty 1

## 2017-04-29 MED ORDER — AZITHROMYCIN 500 MG PO TABS
250.0000 mg | ORAL_TABLET | Freq: Every day | ORAL | Status: DC
  Administered 2017-04-30 – 2017-05-02 (×3): 250 mg via ORAL
  Filled 2017-04-29 (×3): qty 1

## 2017-04-29 MED ORDER — ONDANSETRON HCL 4 MG PO TABS: 4.0000 mg | ORAL_TABLET | Freq: Four times a day (QID) | ORAL | Status: DC | PRN

## 2017-04-29 MED ORDER — ENOXAPARIN SODIUM 30 MG/0.3ML ~~LOC~~ SOLN
30.0000 mg | SUBCUTANEOUS | Status: DC
  Administered 2017-04-29 – 2017-05-01 (×3): 30 mg via SUBCUTANEOUS
  Filled 2017-04-29 (×3): qty 0.3

## 2017-04-29 MED ORDER — AZITHROMYCIN 500 MG PO TABS
500.0000 mg | ORAL_TABLET | Freq: Every day | ORAL | Status: AC
  Administered 2017-04-29: 500 mg via ORAL
  Filled 2017-04-29: qty 1

## 2017-04-29 MED ORDER — CLOPIDOGREL BISULFATE 75 MG PO TABS
75.0000 mg | ORAL_TABLET | Freq: Every day | ORAL | Status: DC
  Administered 2017-04-30 – 2017-05-02 (×3): 75 mg via ORAL
  Filled 2017-04-29 (×3): qty 1

## 2017-04-29 MED ORDER — ACETAMINOPHEN 650 MG RE SUPP: 650.0000 mg | Freq: Four times a day (QID) | RECTAL | Status: DC | PRN

## 2017-04-29 MED ORDER — ACETAMINOPHEN 325 MG PO TABS
650.0000 mg | ORAL_TABLET | Freq: Once | ORAL | Status: AC
  Administered 2017-04-29: 650 mg via ORAL
  Filled 2017-04-29: qty 2

## 2017-04-29 MED ORDER — SODIUM CHLORIDE 0.9 % IV BOLUS (SEPSIS)
1000.0000 mL | Freq: Once | INTRAVENOUS | Status: AC
  Administered 2017-04-29: 1000 mL via INTRAVENOUS

## 2017-04-29 MED ORDER — ALPRAZOLAM 0.5 MG PO TABS
1.0000 mg | ORAL_TABLET | Freq: Once | ORAL | Status: AC
  Administered 2017-04-29: 1 mg via ORAL
  Filled 2017-04-29: qty 2

## 2017-04-29 MED ORDER — GABAPENTIN 300 MG PO CAPS
300.0000 mg | ORAL_CAPSULE | Freq: Every day | ORAL | Status: DC
  Administered 2017-04-29 – 2017-05-01 (×3): 300 mg via ORAL
  Filled 2017-04-29 (×3): qty 1

## 2017-04-29 MED ORDER — PREDNISONE 50 MG PO TABS
50.0000 mg | ORAL_TABLET | Freq: Every day | ORAL | Status: DC
  Administered 2017-04-30 – 2017-05-02 (×3): 50 mg via ORAL
  Filled 2017-04-29 (×3): qty 1

## 2017-04-29 MED ORDER — ACETAMINOPHEN 325 MG PO TABS
650.0000 mg | ORAL_TABLET | Freq: Four times a day (QID) | ORAL | Status: DC | PRN
  Filled 2017-04-29: qty 2

## 2017-04-29 MED ORDER — SODIUM CHLORIDE 0.9% FLUSH
3.0000 mL | Freq: Two times a day (BID) | INTRAVENOUS | Status: DC
  Administered 2017-04-29 – 2017-05-02 (×4): 3 mL via INTRAVENOUS

## 2017-04-29 NOTE — ED Notes (Signed)
Heart Healthy/Carb Modified Dinner Tray Ordered @ 1814-per RN

## 2017-04-29 NOTE — ED Provider Notes (Signed)
[facility] EMERGENCY DEPARTMENT Provider Note   CSN: [id number] Arrival date & time: 04/29/17  1157     History   Chief Complaint Chief Complaint  Patient presents with  . Influenza    HPI Rebecca Benton is a 67 y.o. female.  Patient is a 67 year old female with a history of diabetes, stroke, coronary artery disease status post bypass and stenting presenting today with 3 days of cough, congestion and today it worsened resulting in shortness of breath, hot and cold chills.  Patient states that she did not sleep at all last night because she was up all night coughing.  She produces yellow and clear sputum.  Patient is not a smoker but does have a prior history of asthma.  She does have an inhaler at home which she used this morning but after she was sitting at the doctor's office with her daughter she started feeling short of breath and asked that her daughter called 911.  Patient was satting 92% when paramedics arrived.  She was wheezing diffusely and was given albuterol in route.  Patient is still complaining of feeling short of breath but does feel better with 2 L of oxygen.  She did not receive a flu shot this year and has had no known sick contacts.  She denies any abdominal pain but does feel nauseated.  No change in her bowel habits.  She has had decreased appetite.   The history is provided by the patient and a relative.    Past Medical History:  Diagnosis Date  . Diabetes mellitus without complication ([place])   . Stroke [facility])     There are no active problems to display for this patient.   Past Surgical History:  Procedure Laterality Date  . BELOW KNEE LEG AMPUTATION Right   . CORONARY ARTERY BYPASS GRAFT    . TUBAL LIGATION      OB History    No data available       Home Medications    Prior to Admission medications   Medication Sig Start Date End Date Taking? Authorizing Provider  albuterol (PROVENTIL HFA;VENTOLIN HFA) 108 (90 Base) MCG/ACT  inhaler Inhale 2 puffs into the lungs every 6 (six) hours as needed for wheezing or shortness of breath.    [provider]  ALPRAZolam Duanne Moron) 1 MG tablet Take 1 mg by mouth at bedtime as needed for anxiety.    [provider]  AMLODIPINE BESYLATE PO Take 1 tablet by mouth 4 (four) times daily.    [provider]  ATORVASTATIN CALCIUM PO Take 1 tablet by mouth at bedtime.    [provider]  CARVEDILOL PO Take 1 tablet by mouth 2 (two) times daily.    [provider]  cephALEXin (KEFLEX) 500 MG capsule Take 1 capsule (500 mg total) by mouth 4 (four) times daily. 10/20/16   Daleen Bo, MD  Cyanocobalamin (VITAMIN B 12 PO) Take 1 tablet by mouth daily. Gummy tablet    [provider]  diphenhydrAMINE (BENADRYL) 25 MG tablet Take 12.5 mg by mouth every 6 (six) hours as needed for allergies.    [provider]  Docusate Sodium (COLACE PO) Take 1 tablet by mouth as needed (Constipation).    [provider]  HYDRALAZINE HCL PO Take 1 tablet by mouth 2 (two) times daily.    [provider]  LISINOPRIL PO Take 1 tablet by mouth 4 (four) times daily.    [provider]  oxyCODONE-acetaminophen (PERCOCET)  10-325 MG tablet Take 1 tablet by mouth every 6 (six) hours as needed for pain.    [provider]  PRESCRIPTION MEDICATION Apply 1 application topically as needed (Itching). Medicated Itching Cream    [provider]    Family History History reviewed. No pertinent family history.  Social History Social History   Tobacco Use  . Smoking status: Never Smoker  . Smokeless tobacco: Never Used  Substance Use Topics  . Alcohol use: No  . Drug use: No     Allergies   Penicillins and Tape   Review of Systems Review of Systems  All other systems reviewed and are negative.    Physical Exam Updated Vital Signs BP (!) 145/55   Pulse 75   Temp 99.1 F (37.3 C) (Oral)   Resp 12   Ht  5\' 1"  (1.549 m)   Wt 53.1 kg (117 lb)   SpO2 90%   BMI 22.11 kg/m   Physical Exam  Constitutional: She is oriented to person, place, and time. She appears well-developed and well-nourished. No distress.  HENT:  Head: Normocephalic and atraumatic.  Right Ear: Tympanic membrane normal.  Left Ear: Tympanic membrane normal.  Mouth/Throat: Oropharynx is clear and moist. Mucous membranes are dry.  Eyes: Conjunctivae and EOM are normal. Pupils are equal, round, and reactive to light.  Neck: Normal range of motion. Neck supple.  Cardiovascular: Normal rate, regular rhythm and intact distal pulses.  No murmur heard. Pulmonary/Chest: Effort normal. No respiratory distress. She has wheezes. She has no rales.  Abdominal: Soft. She exhibits no distension. There is no tenderness. There is no rebound and no guarding.  Musculoskeletal: Normal range of motion. She exhibits no edema or tenderness.  Neurological: She is alert and oriented to person, place, and time.  Skin: Skin is warm and dry. No rash noted. No erythema.  Psychiatric: She has a normal mood and affect. Her behavior is normal.  Nursing note and vitals reviewed.    ED Treatments / Results  Labs (all labs ordered are listed, but only abnormal results are displayed) Labs Reviewed  CBC WITH DIFFERENTIAL/PLATELET - Abnormal; Notable for the following components:      Result Value   RBC 3.83 (*)    Hemoglobin 10.6 (*)    HCT 34.4 (*)    Lymphs Abs 0.6 (*)    All other components within normal limits  BASIC METABOLIC PANEL - Abnormal; Notable for the following components:   Potassium 5.5 (*)    CO2 19 (*)    Glucose, Bld 194 (*)    BUN 32 (*)    Creatinine, Ser 2.01 (*)    GFR calc non Af Amer 24 (*)    GFR calc Af Amer 28 (*)    All other components within normal limits  I-STAT CG4 LACTIC ACID, ED - Abnormal; Notable for the following components:   Lactic Acid, Venous 0.43 (*)    All other components within normal limits    INFLUENZA PANEL BY PCR (TYPE A & B)  I-STAT TROPONIN, ED    EKG  EKG Interpretation  Date/Time:  Tuesday April 29 2017 12:04:47 EST Ventricular Rate:  73 PR Interval:    QRS Duration: 80 QT Interval:  402 QTC Calculation: 443 R Axis:   75 Text Interpretation:  Sinus rhythm Anterior infarct, old No significant change since last tracing Confirmed by Blanchie Dessert [phone]) on 04/29/2017 12:26:58 PM       Radiology Dg Chest 2 View  Result Date: 04/29/2017 CLINICAL DATA:  Shortness of breath, productive cough, and fever for the past 3 days. History of COPD, discontinued smoking 2 years ago. EXAM: CHEST  2 VIEW COMPARISON:  None in PACs FINDINGS: The lungs are mildly hyperinflated with hemidiaphragm flattening. There is no alveolar infiltrate or pleural effusion. The heart is top-normal in size. The pulmonary vascularity is normal. The patient has undergone previous median sternotomy. There calcification in the wall of the aortic arch. The bony thorax is unremarkable. IMPRESSION: Chronic bronchitic-smoking related changes. No acute pneumonia nor CHF. Top-normal cardiac size without pulmonary edema. Thoracic aortic atherosclerosis. Electronically Signed   By: David  Martinique M.D.   On: 04/29/2017 13:01    Procedures Procedures (including critical care time)  Medications Ordered in ED Medications  ipratropium (ATROVENT) nebulizer solution 0.5 mg (not administered)  albuterol (PROVENTIL) (2.5 MG/3ML) 0.083% nebulizer solution 5 mg (not administered)  sodium chloride 0.9 % bolus 1,000 mL (1,000 mLs Intravenous New Bag/Given 04/29/17 1232)  acetaminophen (TYLENOL) tablet 650 mg (650 mg Oral Given 04/29/17 1223)  albuterol (PROVENTIL) (2.5 MG/3ML) 0.083% nebulizer solution 5 mg (5 mg Nebulization Given 04/29/17 1223)  ipratropium (ATROVENT) nebulizer solution 0.5 mg (0.5 mg Nebulization Given 04/29/17 1223)  methylPREDNISolone sodium succinate (SOLU-MEDROL) 125 mg/2 mL injection 125 mg (125  mg Intravenous Given 04/29/17 1232)     Initial Impression / Assessment and Plan / ED Course  I have reviewed the triage vital signs and the nursing notes.  Pertinent labs & imaging results that were available during my care of the patient were reviewed by me and considered in my medical decision making (see chart for details).     Pt with symptoms consistent with influenza vs PNA and asthma exacerbation.  Pt is wheezing on exam here but no resp distress.  Mildly hypoxic improved with 2L [place].   No signs of strep pharyngitis, otitis or abnormal abdominal findings.   CXR, CBC, BMP, trop, lactate and flu pending.  Pt given IVF, albuterol/atrovent and solumedrol.  Borderline temp of 99.1 oral and pt given tylenol.  2:08 PM Patient's labs are reassuring.  Chest x-ray with bronchitic changes but no signs of pneumonia.  On repeat evaluation patient is still wheezing but states felt better after the initial treatment.  Will give a second neb.  3:15 PM Flu testing is negative.  However patient continues to wheeze.  She states she suffers from anxiety and she would like to go home however we attempted to walk the patient her oxygen saturation dropped to 87% just by sitting on the side of the bed and she can only take 2 steps without becoming extremely short of breath with tachypnea and wheezing.  Feel that patient will need admission for bronchitis and asthma exacerbation.  Final Clinical Impressions(s) / ED Diagnoses   Final diagnoses:  Viral upper respiratory tract infection  Severe persistent asthma with acute exacerbation    ED Discharge Orders    None       Blanchie Dessert, MD 04/29/17 1523

## 2017-04-29 NOTE — ED Triage Notes (Signed)
Pt arrived via [facility] from daughters PCP for flu like symptoms and wheezing.

## 2017-04-29 NOTE — H&P (Signed)
Date: 04/29/2017               Patient Name:  Rebecca Benton MRN: [id number]  DOB: 09-Aug-1950 Age / Sex: 67 y.o., female   PCP: Lin Landsman, MD         Medical Service: Internal Medicine Teaching Service         Attending Physician: Dr. Oval Linsey, MD    First Contact: Dr. Trilby Drummer Pager: [phone]  Second Contact: Dr. Reesa Chew Pager: [phone]       After Hours (After 5p/  First Contact Pager: [phone]  weekends / holidays): Second Contact Pager: [phone]   Chief Complaint: Shortness of Breath  History of Present Illness: Rebecca Benton is a 67 yo F with history of CAD (s/p CABGx3 2016), R BKA (2017), HTN, and Asthma who presented with worsening shortness of breath. Patient has speech impediment so history is somewhat limited; patient does have daughter with her but the daughter is currently in another ED room being worked up for Rebecca Benton. She states that she had been experiencing a cough (producing green to white sputum), and rhinorrhea for the past 2 days as well as some difficulty sleeping and shortness of breath. She then began to feel significantly short of breath last night and tried taking her albuterol inhaler, which typically relieves her symptom, but this did not help. She states at baseline she is able to be active without SOB. Unclear if she has had asthma exacerbations in the past. She endorsees nausea, vomiting x2 PTA, and some abdominal pain that "feels like she needs to go". She denies fevers, chills, Diaphoresis, Chest Pain, or changes in urination.  In the ED, patient hemodynamically stable with ocassional RR in the 20s and single . She initially wanted to leave after feeling improved with a breathing treatment, but upon attempting to ambulated she became short of breath again and required further treatments. CBC showed Hgb 10.6 (unclear if this is her baseline); BMP showed K 5.5, CO2 19, and Cr 2.01 (unclear baseline); Troponin negative; Flu negative; and LA WNL. EKG showed NSR, Wandering  baseline, and old anterior infarct. CXR showed bronchitic changes without evidence of pneumonia or acute CHF. Patient received 1L IVF, Albuterol neb x2, Ipratropium neb x2, 125mg  IV solumedrol, 2g Mg, and 1mg  ativan. Patient to be admitted for further workup and care.  Meds:  Current Meds  Medication Sig  . albuterol (PROVENTIL HFA;VENTOLIN HFA) 108 (90 Base) MCG/ACT inhaler Inhale 2 puffs into the lungs every 6 (six) hours as needed for wheezing or shortness of breath.  . ALPRAZolam (XANAX) 1 MG tablet Take 1 mg by mouth 3 (three) times daily.   Marland Kitchen amLODipine (NORVASC) 10 MG tablet Take 1 tablet by mouth 2 (two) times daily.   Marland Kitchen atorvastatin (LIPITOR) 40 MG tablet Take 1 tablet by mouth at bedtime.  . carvedilol (COREG) 25 MG tablet Take 25 mg by mouth 2 (two) times daily.   . clopidogrel (PLAVIX) 75 MG tablet Take 75 mg by mouth daily.  . Cyanocobalamin (VITAMIN B 12 PO) Take 1,000 mg by mouth daily. Gummy tablet   . diphenhydrAMINE (BENADRYL) 25 MG tablet Take 12.5 mg by mouth every 6 (six) hours as needed for allergies.  Marland Kitchen docusate sodium (COLACE) 100 MG capsule Take 100 mg by mouth as needed (Constipation).   . DULoxetine (CYMBALTA) 30 MG capsule Take 30 mg by mouth daily.  Marland Kitchen gabapentin (NEURONTIN) 300 MG capsule Take 300 mg by mouth at bedtime.  Marland Kitchen  hydrALAZINE (APRESOLINE) 100 MG tablet Take 100 mg by mouth 2 (two) times daily.   . isosorbide mononitrate (IMDUR) 30 MG 24 hr tablet Take 30 mg by mouth 2 (two) times daily.  Marland Kitchen lisinopril (PRINIVIL,ZESTRIL) 2.5 MG tablet Take 2.5 mg by mouth 4 (four) times daily.   Marland Kitchen oxyCODONE-acetaminophen (PERCOCET) 10-325 MG tablet Take 1 tablet by mouth every 6 (six) hours as needed for pain.     Allergies: Allergies as of 04/29/2017 - Review Complete 04/29/2017  Allergen Reaction Noted  . Penicillins  10/20/2016  . Tape Rash 10/20/2016   Past Medical History:  Diagnosis Date  . Diabetes mellitus without complication ([place])   . Stroke [facility])      Family History: Family History  Problem Relation Age of Onset  . Heart disease Mother   . Diabetes Mother   . Heart disease Father   . Diabetes Father   - Confirmed on Admission  Social History: Social History   Tobacco Use  . Smoking status: Former Smoker    Packs/day: 0.10    Years: 30.00    Pack years: 3.00    Types: Cigarettes  . Smokeless tobacco: Never Used  Substance Use Topics  . Alcohol use: No  . Drug use: No  - Confirmed on admission  Review of Systems: A complete ROS was negative except as per HPI.  Physical Exam: Blood pressure (!) 129/57, pulse 79, temperature 99.1 F (37.3 C), temperature source Oral, resp. rate (!) 21, height 5\' 1"  (1.549 m), weight 117 lb (53.1 kg), SpO2 95 %. Physical Exam  Constitutional: She appears well-developed and well-nourished.  Moderate distress  HENT:  Head: Normocephalic and atraumatic.  Eyes: EOM are normal. Right eye exhibits no discharge. Left eye exhibits no discharge.  Cardiovascular: Normal rate, regular rhythm and intact distal pulses.  systolic murmur  Pulmonary/Chest: She has wheezes.  Moderate distress Diffuse wheezing  Abdominal: Soft. Bowel sounds are normal. She exhibits no distension. There is no tenderness.  Musculoskeletal: She exhibits no edema.  R BKA with prosthetic in place  Neurological: She is alert.  Skin: Skin is warm and dry.    EKG: personally reviewed and I agree with NSR, Wandering baseline, and old anterior infarct  CXR: personally reviewed and I agree with: - Chronic bronchitic-smoking related changes. No acute pneumonia nor CHF. Top-normal cardiac size without pulmonary edema. - Thoracic aortic atherosclerosis.  Assessment & Plan by Problem: Asthma Exacerbation: Patient with presumed asthma exacerbation with her self reported history of asthma and description of recent viral illness. Patient does have a smoking history and bronchitic changes on CXR; undiagnosed COPD with  exacerbation (considering sputum production) cannot be fully ruled out consider patient limited history and records her. Reicieved Duoneb and Mg in the ED - Cardiac Monitoring - Flu negative, will obtain RVP - Azithromycin 500mg , followed by 250 Daily - Prednisone 50mg  Daily - Duoneb q4h - CBC and BMP in AM  CAD: S/P CABGx3 in 2016.  - Continue home Imdur 30mg  Daily - Continue home Coreg 25mg  Daily - Continue home Plavix - Continue home atorvastatin  HTN: Blood pressure normotensive to mildly hypertensive in ED, will hold some meds as it is unclear from her history and record if she truly take them all and do not want to induce hypotension.  - Continue home Coreg 25mg  Daily - Holding amlodipine, lisinopril, and hydralazine - Will restart antihypertensives as needed  Mild hyperkalemia: K 5.5 in ED. Will be suppressed with scheduled nebulization - AM  BMP  FEN: Heart Healthy VTE ppx: Lovenox Code Status: FULL   Dispo: Admit patient to Observation with expected length of stay less than 2 midnights.  Signed: Neva Seat, MD 04/29/2017, 7:30 PM  Pager: [phone]

## 2017-04-29 NOTE — Progress Notes (Signed)
New Admission Note:  Arrival Method: Stretcher Mental Orientation: Alert and oriented x 4 Telemetry: Box 20 Assessment: Completed Skin: Warm and dry IV: NSL  Pain: Denies  Tubes: N/A  Safety Measures: Safety Fall Prevention Plan initiated.  Admission: Completed 5 M  Orientation: Patient has been orientated to the room, unit and the staff. Family: None  Orders have been reviewed and implemented. Will continue to monitor the patient. Call light has been placed within reach and bed alarm has been activated.   Sima Matas BSN, RN  Phone Number: [phone]

## 2017-04-29 NOTE — ED Notes (Signed)
Pt requesting to leave, Dr. Maryan Rued notified

## 2017-04-29 NOTE — ED Notes (Addendum)
Pt 88% on room air sitting on side of bed.  Pt able to take 2 steps before having to sit down to catch her breathe.  Offered pt Ashley back but refused and held it in her lap. Advised Dr. Maryan Rued

## 2017-04-30 DIAGNOSIS — Z87891 Personal history of nicotine dependence: Secondary | ICD-10-CM | POA: Diagnosis not present

## 2017-04-30 DIAGNOSIS — I129 Hypertensive chronic kidney disease with stage 1 through stage 4 chronic kidney disease, or unspecified chronic kidney disease: Secondary | ICD-10-CM | POA: Diagnosis present

## 2017-04-30 DIAGNOSIS — Z880 Allergy status to penicillin: Secondary | ICD-10-CM | POA: Diagnosis not present

## 2017-04-30 DIAGNOSIS — B9789 Other viral agents as the cause of diseases classified elsewhere: Secondary | ICD-10-CM

## 2017-04-30 DIAGNOSIS — J441 Chronic obstructive pulmonary disease with (acute) exacerbation: Secondary | ICD-10-CM

## 2017-04-30 DIAGNOSIS — Z79899 Other long term (current) drug therapy: Secondary | ICD-10-CM

## 2017-04-30 DIAGNOSIS — Z951 Presence of aortocoronary bypass graft: Secondary | ICD-10-CM | POA: Diagnosis not present

## 2017-04-30 DIAGNOSIS — Z7902 Long term (current) use of antithrombotics/antiplatelets: Secondary | ICD-10-CM

## 2017-04-30 DIAGNOSIS — E875 Hyperkalemia: Secondary | ICD-10-CM | POA: Diagnosis present

## 2017-04-30 DIAGNOSIS — R300 Dysuria: Secondary | ICD-10-CM

## 2017-04-30 DIAGNOSIS — R10819 Abdominal tenderness, unspecified site: Secondary | ICD-10-CM

## 2017-04-30 DIAGNOSIS — Z955 Presence of coronary angioplasty implant and graft: Secondary | ICD-10-CM | POA: Diagnosis not present

## 2017-04-30 DIAGNOSIS — E1122 Type 2 diabetes mellitus with diabetic chronic kidney disease: Secondary | ICD-10-CM | POA: Diagnosis present

## 2017-04-30 DIAGNOSIS — B348 Other viral infections of unspecified site: Secondary | ICD-10-CM

## 2017-04-30 DIAGNOSIS — N184 Chronic kidney disease, stage 4 (severe): Secondary | ICD-10-CM

## 2017-04-30 DIAGNOSIS — R011 Cardiac murmur, unspecified: Secondary | ICD-10-CM

## 2017-04-30 DIAGNOSIS — N189 Chronic kidney disease, unspecified: Secondary | ICD-10-CM

## 2017-04-30 DIAGNOSIS — N179 Acute kidney failure, unspecified: Secondary | ICD-10-CM | POA: Diagnosis present

## 2017-04-30 DIAGNOSIS — I2510 Atherosclerotic heart disease of native coronary artery without angina pectoris: Secondary | ICD-10-CM | POA: Diagnosis present

## 2017-04-30 DIAGNOSIS — Z91048 Other nonmedicinal substance allergy status: Secondary | ICD-10-CM | POA: Diagnosis not present

## 2017-04-30 DIAGNOSIS — Z89511 Acquired absence of right leg below knee: Secondary | ICD-10-CM

## 2017-04-30 DIAGNOSIS — J4551 Severe persistent asthma with (acute) exacerbation: Secondary | ICD-10-CM | POA: Diagnosis present

## 2017-04-30 DIAGNOSIS — I350 Nonrheumatic aortic (valve) stenosis: Secondary | ICD-10-CM

## 2017-04-30 DIAGNOSIS — F17200 Nicotine dependence, unspecified, uncomplicated: Secondary | ICD-10-CM

## 2017-04-30 DIAGNOSIS — N390 Urinary tract infection, site not specified: Secondary | ICD-10-CM | POA: Diagnosis not present

## 2017-04-30 DIAGNOSIS — R479 Unspecified speech disturbances: Secondary | ICD-10-CM

## 2017-04-30 DIAGNOSIS — Z8673 Personal history of transient ischemic attack (TIA), and cerebral infarction without residual deficits: Secondary | ICD-10-CM | POA: Diagnosis not present

## 2017-04-30 LAB — RESPIRATORY PANEL BY PCR
Adenovirus: NOT DETECTED
BORDETELLA PERTUSSIS-RVPCR: NOT DETECTED
CHLAMYDOPHILA PNEUMONIAE-RVPPCR: NOT DETECTED
CORONAVIRUS 229E-RVPPCR: NOT DETECTED
CORONAVIRUS HKU1-RVPPCR: NOT DETECTED
Coronavirus NL63: NOT DETECTED
Coronavirus OC43: NOT DETECTED
INFLUENZA B-RVPPCR: NOT DETECTED
Influenza A: NOT DETECTED
MYCOPLASMA PNEUMONIAE-RVPPCR: NOT DETECTED
Metapneumovirus: NOT DETECTED
PARAINFLUENZA VIRUS 2-RVPPCR: NOT DETECTED
Parainfluenza Virus 1: NOT DETECTED
Parainfluenza Virus 3: NOT DETECTED
Parainfluenza Virus 4: NOT DETECTED
RESPIRATORY SYNCYTIAL VIRUS-RVPPCR: NOT DETECTED
Rhinovirus / Enterovirus: DETECTED — AB

## 2017-04-30 LAB — BASIC METABOLIC PANEL
Anion gap: 8 (ref 5–15)
Anion gap: 9 (ref 5–15)
BUN: 38 mg/dL — AB (ref 6–20)
BUN: 41 mg/dL — ABNORMAL HIGH (ref 6–20)
CALCIUM: 8.2 mg/dL — AB (ref 8.9–10.3)
CHLORIDE: 109 mmol/L (ref 101–111)
CHLORIDE: 106 mmol/L (ref 101–111)
CO2: 20 mmol/L — ABNORMAL LOW (ref 22–32)
CO2: 17 mmol/L — ABNORMAL LOW (ref 22–32)
CREATININE: 2.22 mg/dL — AB (ref 0.44–1.00)
CREATININE: 2.22 mg/dL — AB (ref 0.44–1.00)
Calcium: 8.7 mg/dL — ABNORMAL LOW (ref 8.9–10.3)
GFR calc Af Amer: 25 mL/min — ABNORMAL LOW (ref >60)
GFR calc non Af Amer: 22 mL/min — ABNORMAL LOW (ref >60)
GFR calc non Af Amer: 22 mL/min — ABNORMAL LOW (ref >60)
GFR, EST AFRICAN AMERICAN: 25 mL/min — AB (ref >60)
GLUCOSE: 165 mg/dL — AB (ref 65–99)
Glucose, Bld: 270 mg/dL — ABNORMAL HIGH (ref 65–99)
POTASSIUM: 6.1 mmol/L — AB (ref 3.5–5.1)
Potassium: 5.6 mmol/L — ABNORMAL HIGH (ref 3.5–5.1)
SODIUM: 137 mmol/L (ref 135–145)
SODIUM: 132 mmol/L — AB (ref 135–145)

## 2017-04-30 LAB — CBC
HEMATOCRIT: 31.6 % — AB (ref 36.0–46.0)
Hemoglobin: 9.5 g/dL — ABNORMAL LOW (ref 12.0–15.0)
MCH: 27.2 pg (ref 26.0–34.0)
MCHC: 30.1 g/dL (ref 30.0–36.0)
MCV: 90.5 fL (ref 78.0–100.0)
PLATELETS: 197 10*3/uL (ref 150–400)
RBC: 3.49 MIL/uL — ABNORMAL LOW (ref 3.87–5.11)
RDW: 14.1 % (ref 11.5–15.5)
WBC: 2.6 10*3/uL — AB (ref 4.0–10.5)

## 2017-04-30 LAB — POTASSIUM
POTASSIUM: 6.1 mmol/L — AB (ref 3.5–5.1)
POTASSIUM: 5.9 mmol/L — AB (ref 3.5–5.1)
POTASSIUM: 6.1 mmol/L — AB (ref 3.5–5.1)

## 2017-04-30 MED ORDER — FUROSEMIDE 10 MG/ML IJ SOLN
80.0000 mg | Freq: Once | INTRAMUSCULAR | Status: AC
  Administered 2017-04-30: 80 mg via INTRAVENOUS
  Filled 2017-04-30: qty 8

## 2017-04-30 MED ORDER — SODIUM POLYSTYRENE SULFONATE PO POWD
15.0000 g | Freq: Once | ORAL | Status: AC
  Administered 2017-04-30: 15 g via ORAL
  Filled 2017-04-30: qty 15

## 2017-04-30 MED ORDER — SODIUM CHLORIDE 0.9 % IV BOLUS (SEPSIS)
1000.0000 mL | Freq: Once | INTRAVENOUS | Status: AC
  Administered 2017-04-30: 1000 mL via INTRAVENOUS

## 2017-04-30 MED ORDER — ORAL CARE MOUTH RINSE
15.0000 mL | Freq: Two times a day (BID) | OROMUCOSAL | Status: DC
  Administered 2017-04-30 – 2017-05-02 (×3): 15 mL via OROMUCOSAL

## 2017-04-30 MED ORDER — FUROSEMIDE 10 MG/ML IJ SOLN
40.0000 mg | Freq: Once | INTRAMUSCULAR | Status: AC
  Administered 2017-04-30: 40 mg via INTRAVENOUS
  Filled 2017-04-30: qty 4

## 2017-04-30 MED ORDER — ALPRAZOLAM 0.5 MG PO TABS
1.0000 mg | ORAL_TABLET | Freq: Three times a day (TID) | ORAL | Status: DC
  Administered 2017-04-30 – 2017-05-02 (×7): 1 mg via ORAL
  Filled 2017-04-30 (×7): qty 2

## 2017-04-30 MED ORDER — SODIUM POLYSTYRENE SULFONATE 15 GM/60ML PO SUSP
15.0000 g | Freq: Once | ORAL | Status: DC
  Filled 2017-04-30: qty 60

## 2017-04-30 MED ORDER — SODIUM CHLORIDE 0.9 % IV BOLUS (SEPSIS)
500.0000 mL | Freq: Once | INTRAVENOUS | Status: AC
  Administered 2017-04-30: 500 mL via INTRAVENOUS

## 2017-04-30 MED ORDER — IPRATROPIUM-ALBUTEROL 0.5-2.5 (3) MG/3ML IN SOLN
3.0000 mL | Freq: Three times a day (TID) | RESPIRATORY_TRACT | Status: DC
  Administered 2017-05-01 – 2017-05-02 (×5): 3 mL via RESPIRATORY_TRACT
  Filled 2017-04-30 (×5): qty 3

## 2017-04-30 MED ORDER — SODIUM CHLORIDE 0.9 % IV SOLN
INTRAVENOUS | Status: DC
  Administered 2017-04-30 – 2017-05-01 (×2): via INTRAVENOUS

## 2017-04-30 NOTE — Progress Notes (Signed)
Patient could not tolerate PO Kayexalate, was able to drink approximately half of dose before she began gagging. Teaching service is aware

## 2017-04-30 NOTE — Care Management Obs Status (Signed)
[place] NOTIFICATION   Patient Details  Name: Rebecca Benton MRN: [id number] Date of Birth: 08/12/1950   Medicare Observation Status Notification Given:  Yes    Carles Collet, RN 04/30/2017, 3:23 PM

## 2017-04-30 NOTE — H&P (Addendum)
Internal Medicine Attending Admission Note Date: 04/30/2017  Patient name: Rebecca Benton Medical record number: [id number] Date of birth: [birth year] Age: 67 y.o. Gender: female  I saw and evaluated the patient. I reviewed the resident's note and I agree with the resident's findings and plan as documented in the resident's note.  Chief Complaint(s): Progressive shortness of breath 2 days.  History - key components related to admission:  [name] is a 67 year old woman with a history of coronary artery disease status post a coronary artery bypass graft in 2016, right below the knee amputation, hypertension, and a clinical diagnosis of chronic obstructive pulmonary disease who presents with progressive shortness of breath 2 days. She apparently was in her usual state of health until 2 days prior to admission when she developed a cough productive of yellowish-green sputum and shortness of breath. The symptoms progressed over the next 24 hours and became more resistant to her usually effective albuterol inhaler. Because of the dyspnea she had difficulty sleeping the night prior to admission. She therefore presented to the emergency department for further evaluation. In the emergency department she was noted to be mildly hypoxic on ambient air and to become very dyspneic with only a few steps. She was therefore admitted to the internal medicine teaching service for further evaluation and care.  When seen on rounds the afternoon after admission she was resting comfortably in bed with the oxygen completely off. Symptomatically, she stated her breathing was no better than upon admission. Her only new complaint was dysuria with a foul-smelling urine.  Physical Exam - key components related to admission:  Vitals:   04/29/17 2128 04/29/17 2343 04/30/17 0541 04/30/17 0922  BP: (!) 168/64  (!) 126/46 (!) 132/50  Pulse: 90  77 74  Resp: 20  18 19   Temp: (!) 97.5 F (36.4 C)  98.1 F (36.7 C) 98 F (36.7 C)   TempSrc: Oral  Oral Oral  SpO2: 97% 98% 100% 98%  Weight: 107 lb 14.4 oz (48.9 kg)     Height:       Gen.: Well-developed, well-nourished, chronically ill-appearing woman lying comfortably in bed in no acute distress. Lungs: Mild wheezing at the left base but otherwise clear to auscultation. Breath sounds were somewhat distant. Heart: Regular rate and rhythm with a 3/6 crescendo decrescendo systolic murmur consistent with aortic stenosis. I was unable to hear S2. Abdomen: Soft, suprapubic tenderness to palpation without rebound.  Lab results:  Basic Metabolic Panel: Recent Labs    04/29/17 1226 04/29/17 2139 04/30/17 0403  04/30/17 1046 04/30/17 1428  NA 137  --  137  --   --   --   K 5.5*  --  6.1*   < > 5.9* 6.1*  CL 109  --  109  --   --   --   CO2 19*  --  20*  --   --   --   GLUCOSE 194*  --  165*  --   --   --   BUN 32*  --  38*  --   --   --   CREATININE 2.01* 2.13* 2.22*  --   --   --   CALCIUM 8.9  --  8.7*  --   --   --    < > = values in this interval not displayed.   Liver Function Tests: Recent Labs    04/29/17 2200  AST 21  ALT 10*  ALKPHOS 70  BILITOT 0.6  PROT 7.2  ALBUMIN 3.3*   CBC: Recent Labs    04/29/17 1226 04/29/17 2139 04/30/17 0403  WBC 4.9 3.0* 2.6*  NEUTROABS 3.8  --   --   HGB 10.6* 10.7* 9.5*  HCT 34.4* 35.2* 31.6*  MCV 89.8 90.7 90.5  PLT 187 218 197   Hemoglobin A1C: Recent Labs    04/29/17 2139  HGBA1C 6.7*   Urinalysis:  Pending  Misc. Labs:  Influenza A negative Influenza B negative Lactic acid 0.43 Respiratory viral panel positive for rhinovirus/enterovirus  Imaging results:  Dg Chest 2 View  Result Date: 04/29/2017 CLINICAL DATA:  Shortness of breath, productive cough, and fever for the past 3 days. History of COPD, discontinued smoking 2 years ago. EXAM: CHEST  2 VIEW COMPARISON:  None in PACs FINDINGS: The lungs are mildly hyperinflated with hemidiaphragm flattening. There is no alveolar infiltrate or  pleural effusion. The heart is top-normal in size. The pulmonary vascularity is normal. The patient has undergone previous median sternotomy. There calcification in the wall of the aortic arch. The bony thorax is unremarkable. IMPRESSION: Chronic bronchitic-smoking related changes. No acute pneumonia nor CHF. Top-normal cardiac size without pulmonary edema. Thoracic aortic atherosclerosis. Electronically Signed   By: David  Martinique M.D.   On: 04/29/2017 13:01   PA and lateral chest x-ray: Personally reviewed. No effusions, infiltrate, or masses. There are no comparisons immediately available.  Other results:  EKG: Personally reviewed. Normal sinus rhythm at 73 bpm, normal axis, normal intervals, significant Q waves in V1, no LVH by voltage, good R wave progression, no ST segment changes, flattening of the T waves in the limb leads. There are no significant changes from the previous ECG on 10/20/2016.  Assessment & Plan by Problem:  [name] is a 67 year old woman with a history of coronary artery disease status post a coronary artery bypass graft in 2016, right below the knee amputation, hypertension, and a clinical diagnosis of chronic obstructive pulmonary disease who presents with progressive shortness of breath 2 days. Her presentation is most consistent with a COPD exacerbation marked by increased dyspnea, cough, and sputum production. The likely trigger of this was the rhinovirus that was picked up on the respiratory viral panel. Unfortunately, there is not much that can be done for this particular virus with regards to therapy. That said, the COPD exacerbation can be aggressively managed.  1) COPD exacerbation: We will treat with 5 days of prednisone as well as a course of antibiotics in case she has a bacterial superinfection. She will also be maintained on bronchodilators. When seen on rounds tomorrow we will assess her ability to adequately provide herself with inhaled medications by  scrutinizing her inhaler technique. She may benefit from a spacer or even a nebulizer if she has trouble with coordinating that technique.  2) Chronic renal failure: We will obtain a renal ultrasound tomorrow morning to assess for evidence of medical renal disease. My suspicion is that her chronic renal failure is related to long-standing vascular disease including hypertension, diabetes and tobacco abuse.  3) Dysuria: We will send a urinalysis to assess for evidence of a urinary tract infection. This should respond to the antibiotics she is receiving if the infection is sensitive.  4) Hyperkalemia: She has not responded to the albuterol or Lasix. She did not tolerate the Kayexalate. The hyperkalemia may be chronic given her renal failure. She also may have a renal tubular acidosis. We will change her diet to a low potassium diet and inform her about foods to  avoid. Given her renal failure she may respond to some volume control with a loop diuretic were she to become hypertensive. This would be another way of trying to control her hyperkalemia.  5) Disposition: I'm hopeful that her respiratory status will improve over the next 24 hours and she can be discharged home with follow-up in her primary care provider's office.  Addendum:  As a point of clarification, Ms. Riles has stage 4 chronic renal failure.

## 2017-04-30 NOTE — Progress Notes (Signed)
Internal Medicine Attending  Date: 04/30/2017  Patient name: Rebecca Benton Medical record number: [id number] Date of birth: 09/29/1950 Age: 67 y.o. Gender: female  I saw and evaluated the patient. I reviewed the resident's note by Dr. Trilby Drummer and I agree with the resident's findings and plans as documented in his progress note.  Please see my H&P dated 04/30/2017 for the specifics of my evaluation, assessment, and plan from earlier in the day.

## 2017-04-30 NOTE — Progress Notes (Signed)
   Subjective: Rebecca Benton was seen resting in her chair. She complained of continued shortness of breath, but appeared much more comfortable today than yesterday. She was also not wearing her oxygen today when she complained of persisted shortness of breath that was about the same as yesterday. She endorses some suprapubic pain and new dysuria and odorous urine. She states that she has had similar worsening of her shortness of breath where her home albuterol did not help, but never to the point were she had come to the hospital before. She has no other complaints or questions today.  Objective:  Vital signs in last 24 hours: Vitals:   04/29/17 2128 04/29/17 2343 04/30/17 0541 04/30/17 0922  BP: (!) 168/64  (!) 126/46 (!) 132/50  Pulse: 90  77 74  Resp: 20  18 19   Temp: (!) 97.5 F (36.4 C)  98.1 F (36.7 C) 98 F (36.7 C)  TempSrc: Oral  Oral Oral  SpO2: 97% 98% 100% 98%  Weight: 107 lb 14.4 oz (48.9 kg)     Height:       Physical Exam  Constitutional: She appears well-developed and well-nourished. No distress.  Speech impediment  HENT:  Head: Normocephalic and atraumatic.  Eyes: Right eye exhibits discharge. Left eye exhibits discharge.  Cardiovascular: Normal rate, regular rhythm and intact distal pulses.  Systolic Murmur  Pulmonary/Chest: Effort normal. No respiratory distress.  Mild left UL wheezing  Abdominal: Soft. Bowel sounds are normal. She exhibits no distension.  Tender to palpation of suprapubic region  Musculoskeletal: She exhibits no edema.  R BKA  Neurological: She is alert.  Skin: Skin is warm and dry.   Assessment/Plan: COPD vs Asthma Exacerbation: Patient self reported history of asthma. Flu negative, RVP shows rhinovirus. With patients lack of childhood asthma, smoking history, bronchitic changes, and mild flattening of diaphragm on CXR; it seems more likely that patient has undiagnosed COPD with exacerbation. She has had increased sputum production PTA. -  Azithromycin  250mg  Daily - Prednisone 50mg  Daily - Duoneb q4h - CBC and BMP in AM  Hyperkalemia: K 5.5>6.1>5.9>6.1. Received 40mg  IV Lasix with 586mL bolus today. Only able to tolerate half her dose of kayexalate. - 80mg  IV Lasix  - 1071mL bolus - BMP @ 1900 and in AM  ?AKI: Patient presented with Cr 2.01 (unclear baseline), Cr today 2.22. - IVF 159mL/Hr - IVF Boluses with Lasix for hyperkalemia as above - BMP @ 1900 and in AM  CAD: S/P CABGx3 in 2016.  - Continue home Imdur 30mg  Daily - Continue home Coreg 25mg  Daily - Continue home Plavix - Continue home atorvastatin  HTN: Blood pressure normotensive to mildly hypertensive in ED, will hold some meds as it is unclear from her history and record if she truly take them all and do not want to induce hypotension.  - Continue home Coreg 25mg  Daily - Holding amlodipine, lisinopril, and hydralazine - Will restart antihypertensives as needed  FEN: Heart Healthy VTE ppx: Lovenox Code Status: FULL   Dispo: Anticipated discharge in approximately 1-2 day(s).   Neva Seat, MD 04/30/2017, 4:25 PM Pager: [phone]

## 2017-05-01 ENCOUNTER — Inpatient Hospital Stay (HOSPITAL_COMMUNITY): Payer: Medicare Other

## 2017-05-01 DIAGNOSIS — N179 Acute kidney failure, unspecified: Secondary | ICD-10-CM

## 2017-05-01 DIAGNOSIS — N184 Chronic kidney disease, stage 4 (severe): Secondary | ICD-10-CM

## 2017-05-01 LAB — URINALYSIS, ROUTINE W REFLEX MICROSCOPIC
Bilirubin Urine: NEGATIVE
Glucose, UA: NEGATIVE mg/dL
HGB URINE DIPSTICK: NEGATIVE
Ketones, ur: NEGATIVE mg/dL
LEUKOCYTES UA: NEGATIVE
Nitrite: NEGATIVE
PROTEIN: 100 mg/dL — AB
Specific Gravity, Urine: 1.006 (ref 1.005–1.030)
pH: 6 (ref 5.0–8.0)

## 2017-05-01 LAB — BASIC METABOLIC PANEL
ANION GAP: 10 (ref 5–15)
BUN: 40 mg/dL — ABNORMAL HIGH (ref 6–20)
CHLORIDE: 108 mmol/L (ref 101–111)
CO2: 21 mmol/L — AB (ref 22–32)
Calcium: 8.5 mg/dL — ABNORMAL LOW (ref 8.9–10.3)
Creatinine, Ser: 2.14 mg/dL — ABNORMAL HIGH (ref 0.44–1.00)
GFR calc non Af Amer: 23 mL/min — ABNORMAL LOW (ref >60)
GFR, EST AFRICAN AMERICAN: 26 mL/min — AB (ref >60)
Glucose, Bld: 105 mg/dL — ABNORMAL HIGH (ref 65–99)
Potassium: 4.9 mmol/L (ref 3.5–5.1)
Sodium: 139 mmol/L (ref 135–145)

## 2017-05-01 LAB — CBC
HEMATOCRIT: 28.5 % — AB (ref 36.0–46.0)
HEMOGLOBIN: 8.7 g/dL — AB (ref 12.0–15.0)
MCH: 27.2 pg (ref 26.0–34.0)
MCHC: 30.5 g/dL (ref 30.0–36.0)
MCV: 89.1 fL (ref 78.0–100.0)
Platelets: 187 10*3/uL (ref 150–400)
RBC: 3.2 MIL/uL — ABNORMAL LOW (ref 3.87–5.11)
RDW: 13.8 % (ref 11.5–15.5)
WBC: 3.7 10*3/uL — ABNORMAL LOW (ref 4.0–10.5)

## 2017-05-01 MED ORDER — OPTICHAMBER DIAMOND MISC
1.0000 | Freq: Once | Status: AC
  Administered 2017-05-01: 1
  Filled 2017-05-01: qty 1

## 2017-05-01 MED ORDER — DM-GUAIFENESIN ER 30-600 MG PO TB12
1.0000 | ORAL_TABLET | Freq: Two times a day (BID) | ORAL | Status: DC
  Administered 2017-05-01 – 2017-05-02 (×3): 1 via ORAL
  Filled 2017-05-01 (×3): qty 1

## 2017-05-01 MED ORDER — AMLODIPINE BESYLATE 10 MG PO TABS
10.0000 mg | ORAL_TABLET | Freq: Two times a day (BID) | ORAL | Status: DC
  Administered 2017-05-01 – 2017-05-02 (×3): 10 mg via ORAL
  Filled 2017-05-01 (×3): qty 1

## 2017-05-01 MED ORDER — NEPRO/CARBSTEADY PO LIQD
237.0000 mL | Freq: Two times a day (BID) | ORAL | Status: DC
  Administered 2017-05-01: 237 mL via ORAL
  Filled 2017-05-01 (×5): qty 237

## 2017-05-01 MED ORDER — ALBUTEROL SULFATE (2.5 MG/3ML) 0.083% IN NEBU: 2.5000 mg | INHALATION_SOLUTION | RESPIRATORY_TRACT | Status: DC | PRN

## 2017-05-01 NOTE — Progress Notes (Signed)
Patient refuses to ambulate because she does not feel well but is agreeable to try in the morning

## 2017-05-01 NOTE — Care Management Note (Signed)
Case Management Note  Patient Details  Name: Rebecca Benton MRN: [id number] Date of Birth: May 01, 1950  Subjective/Objective:   Admitted for influenza                Action/Plan: Prior to admission patient lived at home with family.  PCP is noted. Arrived via Cab and states will need transportation assistance home. Patient stated does not have money for cab.  Will put in consult for LCSW for transportation assistance.  Usually gets to medical appointments via taxi cab.  Denies inability to afford food or medications.  Expected Discharge Date:  04/30/17               Expected Discharge Plan:     Discharge planning Services  CM Consult  Status of Service:  In process, will continue to follow  Kristen Cardinal, RN  Nurse case manager [phone] 05/01/2017, 11:30 AM

## 2017-05-01 NOTE — Progress Notes (Signed)
Internal Medicine Attending  Date: 05/01/2017  Patient name: Rebecca Benton Medical record number: [id number] Date of birth: Apr 19, 1950 Age: 67 y.o. Gender: female  I saw and evaluated the patient. I reviewed the resident's note by Dr. Trilby Drummer and I agree with the resident's findings and plans as documented in his progress note.  When seen on rounds this morning [name] appeared more comfortable than yesterday. She states her breathing is improved, but she does not feel she is ready to safely be discharged home. We reviewed her inhaler technique today and found to be quite poor. She does shake the canister appropriately and does breathe out. Unfortunately, she places the inhaler in her mouth and takes anywhere from 2-4 successive pumps before taking a breath in. She also did not breath hold. We demonstrated the appropriate way to use an inhaler and have encouraged the use of a spacer. We have asked respiratory therapy to teach her the inhaler technique and provide her with a spacer. It is hoped with better delivery of the medication into the distal bronchials she will have better control of her chronic obstructive pulmonary disease. Her urinalysis appears to have been a dirty catch. With regards to her stage IV chronic kidney disease we will obtain a renal ultrasound today to assess for kidney size. If this is consistent with medical kidney disease I do not believe any further workup is necessary given her underlying diabetes, hypertension, tobacco abuse, and vascular disease. We are hopeful she will be ready for discharge home tomorrow.

## 2017-05-01 NOTE — Progress Notes (Signed)
Initial Nutrition Assessment  DOCUMENTATION CODES:   Not applicable  INTERVENTION:  Provide Nepro Shake po BID, each supplement provides 425 kcal and 19 grams protein.  Encourage adequate PO intake.   Diet education regarding low potassium diet given.   NUTRITION DIAGNOSIS:   Moderate Malnutrition related to chronic illness(COPD, CKD) as evidenced by moderate muscle depletion, moderate fat depletion.  GOAL:   Patient will meet greater than or equal to 90% of their needs  MONITOR:   PO intake, Supplement acceptance, Labs, Weight trends, I & O's, Skin  REASON FOR ASSESSMENT:   Consult Assessment of nutrition requirement/status, Diet education  ASSESSMENT:   67 year old woman with a history of coronary artery disease status post a coronary artery bypass graft in 2016, right below the knee amputation, hypertension, and a clinical diagnosis of chronic obstructive pulmonary disease who presents with progressive shortness of breath 2 days.   Meal completion has been 25-50%. Pt reports appetite is just "so so". Pt does say she was eating fairly well at home PTA with usual consumption of at least 2-3 meals a day with snacks in between. Pt with a 8.5% weight loss in 6 months per weight records, which is not significant for time frame. RD to order nutritional supplements to aid in caloric and protein needs.   RD additionally consulted for diet education regarding low potassium diet as pt with hyperkalemia on admission related to chronic renal failure. Handout "Potassium content in food items" from the Academy of Nutrition and Dietetics Manual given. Foods recommended and not recommended were discussed. Pt reports understanding of information.   Labs and medications reviewed.   NUTRITION - FOCUSED PHYSICAL EXAM:    Most Recent Value  Orbital Region  Unable to assess  Upper Arm Region  Moderate depletion  Thoracic and Lumbar Region  No depletion  Buccal Region  No depletion  Temple  Region  Unable to assess  Clavicle Bone Region  Moderate depletion  Clavicle and Acromion Bone Region  Moderate depletion  Scapular Bone Region  Unable to assess  Dorsal Hand  Unable to assess  Patellar Region  No depletion  Anterior Thigh Region  No depletion  Posterior Calf Region  No depletion  Edema (RD Assessment)  None  Hair  Reviewed  Eyes  Reviewed  Mouth  Reviewed  Skin  Reviewed  Nails  Reviewed       Diet Order:  Diet renal/carb modified with fluid restriction Diet-HS Snack? Nothing; Fluid restriction: 2000 mL Fluid; Room service appropriate? Yes; Fluid consistency: Thin  EDUCATION NEEDS:   Education needs have been addressed  Skin:  Skin Assessment: Reviewed RN Assessment  Last BM:  2/12  Height:   Ht Readings from Last 1 Encounters:  04/29/17 5\' 1"  (1.549 m)    Weight:   Wt Readings from Last 1 Encounters:  04/30/17 107 lb 1.2 oz (48.6 kg)    Ideal Body Weight:  44.6 kg(adjusted for R BKA)  BMI:  Body mass index is 20.23 kg/m.  Estimated Nutritional Needs:   Kcal:  1500-1800  Protein:  65-75 grams  Fluid:  2 L/day    Corrin Parker, MS, RD, LDN Pager # [phone] After hours/ weekend pager # [phone]

## 2017-05-01 NOTE — Progress Notes (Signed)
Subjective: [name] was seen resting in her bed this morning. She states that she is feeling better and is less short of breath today, but continues to feel a little short of breath and does feel quite redy to go home this morning. She was wearing her oxygen today. She continues to experience supra pubic pain and dysuria, the U/A ordered yesterday had not been collected, but nursing says they will collect it this morning. She is feel cold this morning, but it was colder in her room than yesterday, thermostat was adjusted for her, on revisit she was warm as the room had become warm as well. We discussed her high potassium and possibly stopping her lisinopril for a time on discharge until she can follow up with her PCP. We discuss her inhaler use and had her demonstrate how she uses her inhaler. She shook the inhaler appropriately and breath out appropriately but the took 2 puffs and did not inspire deeply; suspect her medication delivery at home is very poor. We also discussed her elevated Cr, she stated that she has had some history of kidney disease, but has not been seen for it hear in [place] since she moved from [place]. She has no other questions or complaints this morning.  Objective:  Vital signs in last 24 hours: Vitals:   04/30/17 1700 04/30/17 2055 04/30/17 2145 05/01/17 0457  BP: (!) 140/58  139/63 (!) 144/61  Pulse: 72  76 69  Resp: 18  18 17   Temp: 98.9 F (37.2 C)  98 F (36.7 C) 98.5 F (36.9 C)  TempSrc: Oral  Oral Oral  SpO2: 100% 100% 100% 91%  Weight:   107 lb 1.2 oz (48.6 kg)   Height:       Physical Exam  Constitutional: She appears well-developed and well-nourished. No distress.  Speech impediment  HENT:  Head: Normocephalic and atraumatic.  Eyes: EOM are normal. Right eye exhibits no discharge. Left eye exhibits no discharge.  Cardiovascular: Normal rate, regular rhythm and intact distal pulses.  Systolic Murmur  Pulmonary/Chest: Effort normal. No  respiratory distress. She has wheezes.  Abdominal: Soft. Bowel sounds are normal. She exhibits no distension.  Tender to palpation of suprapubic region  Musculoskeletal: She exhibits no edema.  R BKA  Neurological: She is alert.  Skin: Skin is warm and dry.   Assessment/Plan: COPD Exacerbation: Patient self reported history of asthma. Flu negative, RVP shows rhinovirus. With patients lack of childhood asthma, smoking history, bronchitic changes, and mild flattening of diaphragm on CXR; it seems more likely that patient has undiagnosed COPD with exacerbation. She has had increased sputum production PTA. Patient continues to improve 2/14. - Inhaler spacer and education on use - Ambulate with Pulse ox - Azithromycin  250mg  Daily - Prednisone 50mg  Daily - Duoneb q8h - Albuterol q4h PRN - CBC and BMP in AM  Suprapubic Pain, Dysuria: Suspect UTI, obtaining U/A today (was not collected yesterday) - U/A  Acute on Chronic Renal Failure. Patient presented with Cr 2.01 (appears to be near her baseline), Cr today 2.14. - IVF 174mL/Hr - Renal Ultrasound - BMP in AM  Hyperkalemia: Resolving with lasix and IVF. K trend 5.5>6.1>5.9>6.1>5.6>4.9 - Continue to monitor   CAD: S/P CABGx3 in 2016.  - Continue home Imdur 30mg  Daily - Continue home Coreg 25mg  Daily - Continue home Plavix - Continue home atorvastatin  HTN: Blood pressure normotensive to hypertensive this admission. 140s AM of 2/14. Continue hold some meds as it is unclear  from her history and record if she truly take them all and do not want to induce hypotension. Will consider holding lisinopril at discharge with presentation of hyperkalemia. - Continue home Coreg 25mg  Daily - Restart Amlodipine - Holding lisinopril, and hydralazine - Will restart antihypertensives as needed  FEN: Heart Healthy, NS 136ml/hr VTE ppx: Lovenox Code Status: FULL   Dispo: Anticipated discharge in approximately 1-2 day(s)  Neva Seat,  MD 05/01/2017, 8:07 AM Pager: [phone]

## 2017-05-02 MED ORDER — PREDNISONE 50 MG PO TABS: 50.0000 mg | ORAL_TABLET | Freq: Every day | ORAL | 0 refills | Status: AC

## 2017-05-02 MED ORDER — AZITHROMYCIN 250 MG PO TABS: 250.0000 mg | ORAL_TABLET | Freq: Every day | ORAL | 0 refills | Status: AC

## 2017-05-02 MED ORDER — ALBUTEROL SULFATE HFA 108 (90 BASE) MCG/ACT IN AERS
2.0000 | INHALATION_SPRAY | Freq: Four times a day (QID) | RESPIRATORY_TRACT | Status: DC | PRN
  Filled 2017-05-02: qty 6.7

## 2017-05-02 MED ORDER — FOSFOMYCIN TROMETHAMINE 3 G PO PACK
3.0000 g | PACK | Freq: Once | ORAL | Status: AC
  Administered 2017-05-02: 3 g via ORAL
  Filled 2017-05-02: qty 3

## 2017-05-02 NOTE — Progress Notes (Signed)
Patient able to ambulate by hard/slowly having a right bka with prosthesis, just out from the room  and requested to turn around secondary to having short of breathing and coughing otherwise she was able to maintain her room air saturation above 90%.

## 2017-05-02 NOTE — Care Management Important Message (Signed)
Important Message  Patient Details  Name: Rebecca Benton MRN: [id number] Date of Birth: [birth year]   Medicare Important Message Given:  Yes  Kristen Cardinal, RN  Nurse case manager 05/02/2017, 2:00 PM

## 2017-05-02 NOTE — Progress Notes (Signed)
Instructed pt in use of MDI with spacer.  Pt able to place MDI in spacer and activate.  Encouraged and answered questions.

## 2017-05-02 NOTE — Progress Notes (Signed)
Able to contact her daughter thru patient's cell phone number which the patient dialed herself for this nurse.

## 2017-05-02 NOTE — Care Management Note (Signed)
Case Management Note  Patient Details  Name: Rebecca Benton MRN: [id number] Date of Birth: March 10, 1951  Subjective/Objective:   Admitted for influenza                Action/Plan: Prior to admission patient lived at home with family.  PCP is noted. Arrived via Cab and states will need transportation assistance home. Patient stated does not have money for cab.  Will put in consult for LCSW for transportation assistance.  Usually gets to medical appointments via taxi cab.  Denies inability to afford food or medications.  Expected Discharge Date:  04/30/17               Expected Discharge Plan:  Home/Self Care  Discharge planning Services  CM Consult  Status of Service:  Completed, signed off  Kristen Cardinal, RN  Nurse case manager [phone] 05/02/2017, 11:31 AM Case Management Note  Patient Details  Name: Rebecca Benton MRN: [id number] Date of Birth: [birth year]  Subjective/Objective:     Admitted for COPD exacerbation; rhinovirus/enterovirus.  Action/Plan: Patient with discharge orders home today.  Expected Discharge Date:  04/30/17               Expected Discharge Plan:  Home/Self Care  In-House Referral:  Clinical Social Work(Transportation assistance upon discharge.)  Discharge planning Services  CM Consult    Choice offered to:     DME Arranged:    DME Agency:     HH Arranged:    [place] Agency:     Status of Service:  Completed, signed off  Kristen Cardinal, RN  Nurse case [place] 05/02/2017, 11:30 AM

## 2017-05-02 NOTE — Progress Notes (Signed)
   Subjective: Ms. Patient was seen sitting at the edge of her bed eating breakfast. She states she is feeling and breathing better and is ready to go home. She will still need to ambulate with pulse ox and receive inhaler and spacer instruction. Informed to follow up in the next week with PCP. No other questions or complaints.  Objective:  Vital signs in last 24 hours: Vitals:   05/01/17 2103 05/01/17 2105 05/02/17 0604 05/02/17 0742  BP:  (!) 152/54 134/70   Pulse:  73 60   Resp:  17 18   Temp:  98.5 F (36.9 C) 98.8 F (37.1 C)   TempSrc:  Oral Oral   SpO2: 97% 97% 97% 95%  Weight:      Height:       Physical Exam  Constitutional: She appears well-developed and well-nourished. No distress.  Speech impediment  HENT:  Head: Normocephalic and atraumatic.  Eyes: EOM are normal. Right eye exhibits no discharge. Left eye exhibits no discharge.  Cardiovascular: Normal rate, regular rhythm, normal heart sounds and intact distal pulses.  Systolic Murmur  Pulmonary/Chest: Effort normal and breath sounds normal. No respiratory distress.  Abdominal: Soft. Bowel sounds are normal. She exhibits no distension.  Tender to palpation of suprapubic region  Musculoskeletal: She exhibits no edema.  R BKA  Neurological: She is alert.  Skin: Skin is warm and dry.   Assessment/Plan: COPD Exacerbation: Resolved. Patient self reported history of asthma. Flu negative, RVP shows rhinovirus. With patients lack of childhood asthma, smoking history, bronchitic changes, and mild flattening of diaphragm on CXR; it seems more likely that patient has undiagnosed COPD with exacerbation. She has had increased sputum production PTA. Patient continues to improve 2/14. - Inhaler spacer and education on use - Ambulate with Pulse ox - Azithromycin  250mg  Daily, one more day outpatient - Prednisone 50mg  Daily, one more day outpatient - Duoneb q8h - Albuterol q4h PRN  Suprapubic Pain, Dysuria: Suspect UTI, U/A  showed bacteria without leukocytes or nitrites. Culture pending. - Fosfomycin 3g, Once  Acute on Chronic Renal Failure. Resolved. Patient presented with Cr 2.01 (appears to be near her baseline). - Renal Ultrasound: Medical renal disease, with one >45mm complex cyst @ L upper pole  Hyperkalemia: Resolved. Lisinopril likely playing a role.  CAD: S/P CABGx3 in 2016.  - Continue home Imdur 30mg  Daily - Continue home Coreg 25mg  Daily - Continue home Plavix - Continue home atorvastatin  HTN: Blood pressure normotensive to hypertensive this admission. 140s AM of 2/14. Continue hold some meds as it is unclear from her history and record if she truly take them all and do not want to induce hypotension. Will consider holding lisinopril at discharge with presentation of hyperkalemia. - Continue home Coreg 25mg  Daily - Restart Amlodipine - Holding lisinopril, and hydralazine - Will restart antihypertensives as needed  FEN: Heart Healthy, NS 129ml/hr VTE ppx: Lovenox Code Status: FULL   Dispo: Anticipated discharge Today.  Neva Seat, MD 05/02/2017, 9:50 AM Pager: [phone]

## 2017-05-02 NOTE — Progress Notes (Signed)
Internal Medicine Attending  Date: 05/02/2017  Patient name: Rebecca Benton Medical record number: [id number] Date of birth: 08-10-1950 Age: 67 y.o. Gender: female  I saw and evaluated the patient. I reviewed the resident's note by Dr. Shan Levans and I agree with the resident's findings and plans as documented in his progress note.  When [name] was seen on rounds this morning she was feeling much improved. She had yet to receive the inhaler with spacer training which is key to her long-term management. She is stable for discharge home. We will complete 1 more day of antibiotics and prednisone for her COPD exacerbation which may have been caused by rhinovirus.

## 2017-05-03 NOTE — Discharge Summary (Signed)
Name: Rebecca Benton MRN: [id number] DOB: February 06, 1951 67 y.o. PCP: Lin Landsman, MD  Date of Admission: 04/29/2017 11:57 AM Date of Discharge: 05/02/2017 Attending Physician: Oval Linsey, MD  Discharge Diagnosis:  Active Problems:   Asthma exacerbation   COPD exacerbation ([place])   Rhinovirus infection   Chronic renal failure, stage 4 (severe) (HCC)   Hyperkalemia   Dysuria   Discharge Medications: Allergies as of 05/02/2017      Reactions   Penicillins    Has patient had a PCN reaction causing immediate rash, facial/tongue/throat swelling, SOB or lightheadedness with hypotension: YES Has patient had a PCN reaction causing severe rash involving mucus membranes or skin necrosis: NO Has patient had a PCN reaction that required hospitalization: YES Has patient had a PCN reaction occurring within the last 10 years: NO If all of the above answers are "NO", then may proceed with Cephalosporin use.   Tape Rash   Skin Irritation      Medication List    STOP taking these medications   cephALEXin 500 MG capsule Commonly known as:  KEFLEX   hydrALAZINE 100 MG tablet Commonly known as:  APRESOLINE   lisinopril 2.5 MG tablet Commonly known as:  PRINIVIL,ZESTRIL     TAKE these medications   albuterol 108 (90 Base) MCG/ACT inhaler Commonly known as:  PROVENTIL HFA;VENTOLIN HFA Inhale 2 puffs into the lungs every 6 (six) hours as needed for wheezing or shortness of breath.   ALPRAZolam 1 MG tablet Commonly known as:  XANAX Take 1 mg by mouth 3 (three) times daily.   amLODipine 10 MG tablet Commonly known as:  NORVASC Take 1 tablet by mouth 2 (two) times daily.   atorvastatin 40 MG tablet Commonly known as:  LIPITOR Take 1 tablet by mouth at bedtime.   azithromycin 250 MG tablet Commonly known as:  ZITHROMAX Take 1 tablet (250 mg total) by mouth daily for 1 day.   carvedilol 25 MG tablet Commonly known as:  COREG Take 25 mg by mouth 2 (two) times daily.   clopidogrel  75 MG tablet Commonly known as:  PLAVIX Take 75 mg by mouth daily.   COLACE 100 MG capsule Generic drug:  docusate sodium Take 100 mg by mouth as needed (Constipation).   diphenhydrAMINE 25 MG tablet Commonly known as:  BENADRYL Take 12.5 mg by mouth every 6 (six) hours as needed for allergies.   DULoxetine 30 MG capsule Commonly known as:  CYMBALTA Take 30 mg by mouth daily.   gabapentin 300 MG capsule Commonly known as:  NEURONTIN Take 300 mg by mouth at bedtime.   isosorbide mononitrate 30 MG 24 hr tablet Commonly known as:  IMDUR Take 30 mg by mouth 2 (two) times daily.   oxyCODONE-acetaminophen 10-325 MG tablet Commonly known as:  PERCOCET Take 1 tablet by mouth every 6 (six) hours as needed for pain.   predniSONE 50 MG tablet Commonly known as:  DELTASONE Take 1 tablet (50 mg total) by mouth daily with breakfast for 1 day.   PRESCRIPTION MEDICATION Apply 1 application topically as needed (Itching). Medicated Itching Cream   VITAMIN B 12 PO Take 1,000 mg by mouth daily. Gummy tablet       Disposition and follow-up:   Ms.Rebecca Benton was discharged from [facility] in Stable condition.  At the hospital follow up visit please address:  1. COPD Exacerbation: Please obtain PFTs for formal diagnosis of COPD / Asthma. Ensure patient has her spacer and remembers how  to properly use her inhaler/spacer.  2. Hyperkalemia: Patient was mildly hyperkalemic on admission. This resolved with IVF and Lasix. Her lisinopril was discontinued as her blood pressure was low-normal while admitted and this could contribute to her hyperkalemia. Please recheck BMP to evaluate for further hyperkalemia.    3. CKD: Patient noted to have CKD this admission. Renal U/S showed medical renal disease. U/S also showed >27mm complex L upper pole cyst with recommendation to consider CT to better characterize.  4. HTN: Patients normotensive while here on amlodipine and Coreg only.  Lisinopril discontinued due to hyperkalemia and normal BP. Hydralazine was discontinued as well. Please reevaluate blood pressure and continued need for these antihypertensives.  5. UTI: Patient experienced suprapubic tenderness, dysuria, and urine odor. U/A showed Bacteria without leukocytes. She was given a dose of fosfomycin. Evaluate for resolution/Recurrence of symptoms.  6.  Labs / imaging needed at time of follow-up: BMP  7.  Pending labs/ test needing follow-up: None  Follow-up Appointments: Follow-up Information    Lin Landsman, MD. Schedule an appointment as soon as possible for a visit.   Specialty:  Family Medicine Why:  Call today to make an appointment for a day in the next week. Contact information: [place] [address] [phone]           Hospital Course by problem list:   COPD Exacerbation: Patient presented with worsening shortness of breath. She had been experiencing a cough (producing green to white sputum), and rhinorrhea for 2 days with some dyspnea, which had worsened in the last day and was unresponsive to her home inhaler. Patient reported a history of Asthma, but considering her lack of childhood asthma, smoking history, bronchitic changes, and mild flattening of diaphragm on CXR; she was treated for undiagnosed COPD in exacerbation. She improved with breathing treatments, steroids, and antibiotics. She was flu negative and Viral Panel showed rhinovirus. When asked to demonstrate how she uses her inhaler she was found `to not inspire adequately preventing the medication from reaching her lungs with her technique. She was provided with a spacer and inhaler education by respiratory therapy. With proper home use of inhalers, she will hopefully be able to avoid ED visits and potential admissions in the future. She was discharged with 1 additional day of prednisone and 1 additional day of azithromycin.   Discharge Vitals:   BP (!) 149/51 (BP  Location: Right Arm)   Pulse 60   Temp 97.6 F (36.4 C) (Oral)   Resp 16   Ht 5\' 1"  (1.549 m)   Wt 107 lb 1.2 oz (48.6 kg)   SpO2 95%   BMI 20.23 kg/m   Pertinent Labs, Studies, and Procedures:  BMP Latest Ref Rng & Units 05/01/2017 04/30/2017 04/30/2017  Glucose 65 - 99 mg/dL 105(H) 270(H) -  BUN 6 - 20 mg/dL 40(H) 41(H) -  Creatinine 0.44 - 1.00 mg/dL 2.14(H) 2.22(H) -  Sodium 135 - 145 mmol/L 139 132(L) -  Potassium 3.5 - 5.1 mmol/L 4.9 5.6(H) 6.1(H)  Chloride 101 - 111 mmol/L 108 106 -  CO2 22 - 32 mmol/L 21(L) 17(L) -  Calcium 8.9 - 10.3 mg/dL 8.5(L) 8.2(L) -   CBC Latest Ref Rng & Units 05/01/2017 04/30/2017 04/29/2017  WBC 4.0 - 10.5 K/uL 3.7(L) 2.6(L) 3.0(L)  Hemoglobin 12.0 - 15.0 g/dL 8.7(L) 9.5(L) 10.7(L)  Hematocrit 36.0 - 46.0 % 28.5(L) 31.6(L) 35.2(L)  Platelets 150 - 400 K/uL 187 197 218   CXR: IMPRESSION: - Chronic bronchitic-smoking related  changes. No acute pneumonia nor CHF. Top-normal cardiac size without pulmonary edema. - Thoracic aortic atherosclerosis.  Renal U/S Bilat: IMPRESSION: - BILATERAL increased renal echogenicity consistent with medical renal disease. - Complex LEFT upper pole cystic lesion, greater than 2 cm. Consider further investigation with CT scanning. - No hydronephrosis.   Discharge Instructions: Discharge Instructions    Call MD for:  difficulty breathing, headache or visual disturbances   Complete by:  As directed    Call MD for:  hives   Complete by:  As directed    Call MD for:  persistant dizziness or light-headedness   Complete by:  As directed    Call MD for:  persistant nausea and vomiting   Complete by:  As directed    Diet - low sodium heart healthy   Complete by:  As directed    Discharge instructions   Complete by:  As directed    Thank you for allowing [place] to care for you  You symptoms are believed to have been caused by an exacerbation of undiagnosed COPD. - We have prescribed 1 additional day of antibiotics  (Azithromycin) to be taken Saturday morning - We have prescribed 1 additional day of Prednisone to be taken Saturday morning - Please have your Primary Care Provider refer you for Pulmonary Function Tests for evaluation of COPD - Please use your inhaler with a spacer as instructed when you feel short of breath or are wheezing  For your Blood Pressure: - Stop taking lisinopril as this may be the source of your high potassium - We have also stopped you hydralazine as your blood pressure has been well controlled without it - Please discuss this with your PCP and have them recheck your Potassium  For your urinary symptoms, we suspect an UTI: - You were given an antibiotic (fosfomycin) - Please follow up with your PCP and let them know if symptoms persis  Please follow up with you primary care provider in the next 1 to 2 weeks.  Contact a Medical Professional If you Experience sever symptoms   Increase activity slowly   Complete by:  As directed       Signed: Neva Seat, MD 05/03/2017, 7:18 AM   Pager: [phone]

## 2017-11-05 ENCOUNTER — Emergency Department (HOSPITAL_COMMUNITY)
Admission: EM | Admit: 2017-11-05 | Discharge: 2017-11-06 | Disposition: A | Discharge status: Home / Self Care | Payer: Medicare Other | Attending: Emergency Medicine | Admitting: Emergency Medicine

## 2017-11-05 DIAGNOSIS — R0602 Shortness of breath: Secondary | ICD-10-CM | POA: Diagnosis present

## 2017-11-05 DIAGNOSIS — Z5321 Procedure and treatment not carried out due to patient leaving prior to being seen by health care provider: Secondary | ICD-10-CM | POA: Diagnosis not present

## 2017-11-05 HISTORY — DX: Chronic obstructive pulmonary disease, unspecified: J44.9

## 2017-11-05 NOTE — ED Triage Notes (Signed)
Pt presents to ER with [facility] to triage where patient was receiving a duoneb for her cough & SOB x 1 wk; fire dept spo2 monitor picked up 58% on room air; pt found out she was coming to triage and refused to get in wheelchair; pt requesting EMS take her back home; EMS insisting that she get medical help here that she called 911 for; pt complied with wheelchair and is now getting EKG and VS

## 2017-11-06 ENCOUNTER — Encounter (HOSPITAL_COMMUNITY): Payer: Self-pay | Admitting: Emergency Medicine

## 2017-11-06 ENCOUNTER — Emergency Department (HOSPITAL_COMMUNITY): Payer: Medicare Other

## 2017-11-06 DIAGNOSIS — R0602 Shortness of breath: Secondary | ICD-10-CM | POA: Diagnosis not present

## 2017-11-06 LAB — BASIC METABOLIC PANEL
ANION GAP: 8 (ref 5–15)
BUN: 40 mg/dL — AB (ref 8–23)
CO2: 25 mmol/L (ref 22–32)
Calcium: 8.5 mg/dL — ABNORMAL LOW (ref 8.9–10.3)
Chloride: 110 mmol/L (ref 98–111)
Creatinine, Ser: 3.08 mg/dL — ABNORMAL HIGH (ref 0.44–1.00)
GFR calc Af Amer: 17 mL/min — ABNORMAL LOW (ref >60)
GFR calc non Af Amer: 15 mL/min — ABNORMAL LOW (ref >60)
GLUCOSE: 210 mg/dL — AB (ref 70–99)
POTASSIUM: 4.5 mmol/L (ref 3.5–5.1)
Sodium: 143 mmol/L (ref 135–145)

## 2017-11-06 LAB — I-STAT TROPONIN, ED: Troponin i, poc: 0.03 ng/mL (ref 0.00–0.08)

## 2017-11-06 LAB — CBC
HEMATOCRIT: 40.8 % (ref 36.0–46.0)
HEMOGLOBIN: 12.3 g/dL (ref 12.0–15.0)
MCH: 27.6 pg (ref 26.0–34.0)
MCHC: 30.1 g/dL (ref 30.0–36.0)
MCV: 91.5 fL (ref 78.0–100.0)
Platelets: 177 10*3/uL (ref 150–400)
RBC: 4.46 MIL/uL (ref 3.87–5.11)
RDW: 14 % (ref 11.5–15.5)
WBC: 4.5 10*3/uL (ref 4.0–10.5)

## 2017-11-06 NOTE — ED Notes (Signed)
Pt does not want to stay. Pt asked to stay and at least speak to a nurse before leaving. Pt and family refuses. Pt wristband retrieved and pt seen leaving lobby with family.

## 2017-11-06 NOTE — ED Notes (Signed)
Please have patient call her daughter, Sherrine Maples @ [phone].

## 2017-11-07 NOTE — ED Notes (Signed)
Follow up call made  No answer  11/07/17 1031  s Lesia Monica rn

## 2017-12-25 ENCOUNTER — Encounter (HOSPITAL_COMMUNITY): Payer: Self-pay

## 2017-12-25 ENCOUNTER — Other Ambulatory Visit: Payer: Self-pay

## 2017-12-25 ENCOUNTER — Inpatient Hospital Stay (HOSPITAL_COMMUNITY)
Admission: EM | Admit: 2017-12-25 | Discharge: 2017-12-27 | DRG: 190 | Disposition: A | Discharge status: Home / Self Care | Payer: Medicare Other | Attending: Internal Medicine | Admitting: Internal Medicine

## 2017-12-25 ENCOUNTER — Emergency Department (HOSPITAL_COMMUNITY): Payer: Medicare Other

## 2017-12-25 DIAGNOSIS — E538 Deficiency of other specified B group vitamins: Secondary | ICD-10-CM | POA: Diagnosis present

## 2017-12-25 DIAGNOSIS — E1122 Type 2 diabetes mellitus with diabetic chronic kidney disease: Secondary | ICD-10-CM | POA: Diagnosis present

## 2017-12-25 DIAGNOSIS — E860 Dehydration: Secondary | ICD-10-CM | POA: Diagnosis present

## 2017-12-25 DIAGNOSIS — Z888 Allergy status to other drugs, medicaments and biological substances status: Secondary | ICD-10-CM

## 2017-12-25 DIAGNOSIS — Y92231 Patient bathroom in hospital as the place of occurrence of the external cause: Secondary | ICD-10-CM | POA: Diagnosis not present

## 2017-12-25 DIAGNOSIS — Z79899 Other long term (current) drug therapy: Secondary | ICD-10-CM

## 2017-12-25 DIAGNOSIS — Z7982 Long term (current) use of aspirin: Secondary | ICD-10-CM | POA: Diagnosis not present

## 2017-12-25 DIAGNOSIS — Z91048 Other nonmedicinal substance allergy status: Secondary | ICD-10-CM

## 2017-12-25 DIAGNOSIS — J441 Chronic obstructive pulmonary disease with (acute) exacerbation: Secondary | ICD-10-CM | POA: Diagnosis present

## 2017-12-25 DIAGNOSIS — Z87891 Personal history of nicotine dependence: Secondary | ICD-10-CM | POA: Diagnosis not present

## 2017-12-25 DIAGNOSIS — E1165 Type 2 diabetes mellitus with hyperglycemia: Secondary | ICD-10-CM | POA: Diagnosis not present

## 2017-12-25 DIAGNOSIS — Z8673 Personal history of transient ischemic attack (TIA), and cerebral infarction without residual deficits: Secondary | ICD-10-CM | POA: Diagnosis not present

## 2017-12-25 DIAGNOSIS — Z951 Presence of aortocoronary bypass graft: Secondary | ICD-10-CM

## 2017-12-25 DIAGNOSIS — Z89511 Acquired absence of right leg below knee: Secondary | ICD-10-CM

## 2017-12-25 DIAGNOSIS — T380X5A Adverse effect of glucocorticoids and synthetic analogues, initial encounter: Secondary | ICD-10-CM | POA: Diagnosis not present

## 2017-12-25 DIAGNOSIS — I2510 Atherosclerotic heart disease of native coronary artery without angina pectoris: Secondary | ICD-10-CM | POA: Diagnosis present

## 2017-12-25 DIAGNOSIS — J9601 Acute respiratory failure with hypoxia: Secondary | ICD-10-CM | POA: Diagnosis present

## 2017-12-25 DIAGNOSIS — I129 Hypertensive chronic kidney disease with stage 1 through stage 4 chronic kidney disease, or unspecified chronic kidney disease: Secondary | ICD-10-CM | POA: Diagnosis present

## 2017-12-25 DIAGNOSIS — Z5329 Procedure and treatment not carried out because of patient's decision for other reasons: Secondary | ICD-10-CM | POA: Diagnosis not present

## 2017-12-25 DIAGNOSIS — N184 Chronic kidney disease, stage 4 (severe): Secondary | ICD-10-CM | POA: Diagnosis present

## 2017-12-25 LAB — COMPREHENSIVE METABOLIC PANEL
ALBUMIN: 2.7 g/dL — AB (ref 3.5–5.0)
ALT: 10 U/L (ref 0–44)
ANION GAP: 10 (ref 5–15)
AST: 15 U/L (ref 15–41)
Alkaline Phosphatase: 77 U/L (ref 38–126)
BILIRUBIN TOTAL: 0.3 mg/dL (ref 0.3–1.2)
BUN: 40 mg/dL — ABNORMAL HIGH (ref 8–23)
CO2: 23 mmol/L (ref 22–32)
Calcium: 8.8 mg/dL — ABNORMAL LOW (ref 8.9–10.3)
Chloride: 106 mmol/L (ref 98–111)
Creatinine, Ser: 2.93 mg/dL — ABNORMAL HIGH (ref 0.44–1.00)
GFR calc Af Amer: 18 mL/min — ABNORMAL LOW (ref >60)
GFR calc non Af Amer: 15 mL/min — ABNORMAL LOW (ref >60)
Glucose, Bld: 164 mg/dL — ABNORMAL HIGH (ref 70–99)
POTASSIUM: 4.9 mmol/L (ref 3.5–5.1)
SODIUM: 139 mmol/L (ref 135–145)
Total Protein: 6.9 g/dL (ref 6.5–8.1)

## 2017-12-25 LAB — CBC WITH DIFFERENTIAL/PLATELET
Abs Immature Granulocytes: 0.01 10*3/uL (ref 0.00–0.07)
BASOS PCT: 1 %
Basophils Absolute: 0 10*3/uL (ref 0.0–0.1)
EOS ABS: 1 10*3/uL — AB (ref 0.0–0.5)
EOS PCT: 19 %
HEMATOCRIT: 40.9 % (ref 36.0–46.0)
Hemoglobin: 12.2 g/dL (ref 12.0–15.0)
Immature Granulocytes: 0 %
Lymphocytes Relative: 20 %
Lymphs Abs: 1.1 10*3/uL (ref 0.7–4.0)
MCH: 27.1 pg (ref 26.0–34.0)
MCHC: 29.8 g/dL — AB (ref 30.0–36.0)
MCV: 90.9 fL (ref 80.0–100.0)
Monocytes Absolute: 0.5 10*3/uL (ref 0.1–1.0)
Monocytes Relative: 9 %
Neutro Abs: 2.8 10*3/uL (ref 1.7–7.7)
Neutrophils Relative %: 51 %
Platelets: 235 10*3/uL (ref 150–400)
RBC: 4.5 MIL/uL (ref 3.87–5.11)
RDW: 14.6 % (ref 11.5–15.5)
WBC: 5.4 10*3/uL (ref 4.0–10.5)
nRBC: 0 % (ref 0.0–0.2)

## 2017-12-25 LAB — GLUCOSE, CAPILLARY: GLUCOSE-CAPILLARY: 372 mg/dL — AB (ref 70–99)

## 2017-12-25 LAB — I-STAT TROPONIN, ED
Troponin i, poc: 0.01 ng/mL (ref 0.00–0.08)
Troponin i, poc: 0.02 ng/mL (ref 0.00–0.08)

## 2017-12-25 MED ORDER — DOXYCYCLINE HYCLATE 100 MG PO TABS
100.0000 mg | ORAL_TABLET | Freq: Two times a day (BID) | ORAL | Status: DC
  Administered 2017-12-25 – 2017-12-26 (×3): 100 mg via ORAL
  Filled 2017-12-25 (×3): qty 1

## 2017-12-25 MED ORDER — INSULIN ASPART 100 UNIT/ML ~~LOC~~ SOLN
0.0000 [IU] | Freq: Every day | SUBCUTANEOUS | Status: DC
  Administered 2017-12-25: 5 [IU] via SUBCUTANEOUS
  Administered 2017-12-26: 2 [IU] via SUBCUTANEOUS

## 2017-12-25 MED ORDER — IPRATROPIUM-ALBUTEROL 0.5-2.5 (3) MG/3ML IN SOLN
3.0000 mL | Freq: Three times a day (TID) | RESPIRATORY_TRACT | Status: DC
  Administered 2017-12-26: 3 mL via RESPIRATORY_TRACT
  Filled 2017-12-25 (×2): qty 3

## 2017-12-25 MED ORDER — OXYCODONE-ACETAMINOPHEN 5-325 MG PO TABS: 1.0000 | ORAL_TABLET | Freq: Four times a day (QID) | ORAL | Status: DC | PRN

## 2017-12-25 MED ORDER — IPRATROPIUM-ALBUTEROL 0.5-2.5 (3) MG/3ML IN SOLN
3.0000 mL | Freq: Four times a day (QID) | RESPIRATORY_TRACT | Status: DC
  Administered 2017-12-25: 3 mL via RESPIRATORY_TRACT
  Filled 2017-12-25 (×2): qty 3

## 2017-12-25 MED ORDER — IPRATROPIUM-ALBUTEROL 0.5-2.5 (3) MG/3ML IN SOLN
3.0000 mL | Freq: Once | RESPIRATORY_TRACT | Status: AC
  Administered 2017-12-25: 3 mL via RESPIRATORY_TRACT
  Filled 2017-12-25: qty 3

## 2017-12-25 MED ORDER — ALPRAZOLAM 1 MG PO TABS
1.0000 mg | ORAL_TABLET | Freq: Three times a day (TID) | ORAL | Status: DC | PRN
  Administered 2017-12-25 – 2017-12-26 (×3): 1 mg via ORAL
  Filled 2017-12-25 (×3): qty 1

## 2017-12-25 MED ORDER — ALBUTEROL SULFATE (2.5 MG/3ML) 0.083% IN NEBU: 2.5000 mg | INHALATION_SOLUTION | RESPIRATORY_TRACT | Status: DC | PRN

## 2017-12-25 MED ORDER — OXYCODONE HCL 5 MG PO TABS: 5.0000 mg | ORAL_TABLET | Freq: Four times a day (QID) | ORAL | Status: DC | PRN

## 2017-12-25 MED ORDER — PREDNISONE 20 MG PO TABS: 40.0000 mg | ORAL_TABLET | Freq: Every day | ORAL | Status: DC

## 2017-12-25 MED ORDER — ISOSORBIDE MONONITRATE ER 30 MG PO TB24
30.0000 mg | ORAL_TABLET | Freq: Every day | ORAL | Status: DC
  Administered 2017-12-25 – 2017-12-27 (×3): 30 mg via ORAL
  Filled 2017-12-25 (×3): qty 1

## 2017-12-25 MED ORDER — METHYLPREDNISOLONE SODIUM SUCC 125 MG IJ SOLR
60.0000 mg | Freq: Two times a day (BID) | INTRAMUSCULAR | Status: AC
  Administered 2017-12-25: 60 mg via INTRAVENOUS
  Filled 2017-12-25: qty 2

## 2017-12-25 MED ORDER — MAGNESIUM SULFATE 2 GM/50ML IV SOLN
2.0000 g | Freq: Once | INTRAVENOUS | Status: AC
  Administered 2017-12-25: 2 g via INTRAVENOUS
  Filled 2017-12-25: qty 50

## 2017-12-25 MED ORDER — CARVEDILOL 25 MG PO TABS
25.0000 mg | ORAL_TABLET | Freq: Two times a day (BID) | ORAL | Status: DC
  Administered 2017-12-25 – 2017-12-27 (×4): 25 mg via ORAL
  Filled 2017-12-25 (×5): qty 1

## 2017-12-25 MED ORDER — AMLODIPINE BESYLATE 10 MG PO TABS
10.0000 mg | ORAL_TABLET | Freq: Every day | ORAL | Status: DC
  Administered 2017-12-25 – 2017-12-27 (×3): 10 mg via ORAL
  Filled 2017-12-25 (×3): qty 1

## 2017-12-25 MED ORDER — DOCUSATE SODIUM 100 MG PO CAPS: 100.0000 mg | ORAL_CAPSULE | Freq: Two times a day (BID) | ORAL | Status: DC | PRN

## 2017-12-25 MED ORDER — OXYCODONE-ACETAMINOPHEN 10-325 MG PO TABS: 1.0000 | ORAL_TABLET | Freq: Four times a day (QID) | ORAL | Status: DC | PRN

## 2017-12-25 MED ORDER — DULOXETINE HCL 30 MG PO CPEP
30.0000 mg | ORAL_CAPSULE | Freq: Every day | ORAL | Status: DC
  Administered 2017-12-25 – 2017-12-27 (×3): 30 mg via ORAL
  Filled 2017-12-25 (×3): qty 1

## 2017-12-25 MED ORDER — VALACYCLOVIR HCL 500 MG PO TABS
500.0000 mg | ORAL_TABLET | Freq: Every day | ORAL | Status: DC
  Filled 2017-12-25 (×2): qty 1

## 2017-12-25 MED ORDER — HEPARIN SODIUM (PORCINE) 5000 UNIT/ML IJ SOLN
5000.0000 [IU] | Freq: Three times a day (TID) | INTRAMUSCULAR | Status: DC
  Administered 2017-12-25 – 2017-12-26 (×4): 5000 [IU] via SUBCUTANEOUS
  Filled 2017-12-25 (×4): qty 1

## 2017-12-25 MED ORDER — GABAPENTIN 300 MG PO CAPS
300.0000 mg | ORAL_CAPSULE | Freq: Every day | ORAL | Status: DC
  Administered 2017-12-25 – 2017-12-26 (×2): 300 mg via ORAL
  Filled 2017-12-25 (×2): qty 1

## 2017-12-25 MED ORDER — ASPIRIN EC 81 MG PO TBEC
81.0000 mg | DELAYED_RELEASE_TABLET | Freq: Every day | ORAL | Status: DC
  Administered 2017-12-25 – 2017-12-27 (×3): 81 mg via ORAL
  Filled 2017-12-25 (×3): qty 1

## 2017-12-25 MED ORDER — ATORVASTATIN CALCIUM 40 MG PO TABS
40.0000 mg | ORAL_TABLET | Freq: Every day | ORAL | Status: DC
  Administered 2017-12-25 – 2017-12-26 (×2): 40 mg via ORAL
  Filled 2017-12-25 (×2): qty 1

## 2017-12-25 MED ORDER — INSULIN ASPART 100 UNIT/ML ~~LOC~~ SOLN
0.0000 [IU] | Freq: Three times a day (TID) | SUBCUTANEOUS | Status: DC
  Administered 2017-12-26: 2 [IU] via SUBCUTANEOUS
  Administered 2017-12-26: 5 [IU] via SUBCUTANEOUS
  Administered 2017-12-26 – 2017-12-27 (×2): 3 [IU] via SUBCUTANEOUS

## 2017-12-25 NOTE — ED Notes (Signed)
Bed: WA07 Expected date:  Expected time:  Means of arrival:  Comments: EMS- 67yo F, SOB/COPD

## 2017-12-25 NOTE — H&P (Signed)
History and Physical    Rebecca Benton VOH:[id number] DOB: Feb 21, 1951 DOA: 12/25/2017  PCP: Lin Landsman, MD  Patient coming from: home  I have personally briefly reviewed patient's old medical records in [place]  Chief Complaint: SOB  HPI: Rebecca Benton is Rebecca Benton 67 y.o. female with medical history significant of coronary artery disease status post CABG in 2016, COPD, diabetes, hypertension, right BKA in 2017, multiple other problems presenting with persistent shortness of breath over the past month.   Patient notes that she has been having trouble breathing on and off for the past month.  She notes that it keeps coming back.  She is notes Sanuel Ladnier history of COPD for 3 to 4 years.  She complains of cough as well as shortness of breath and wheezing.  She denies any recent treatment with steroids.  She notes that she is just been treated with breathing treatments.  She is not on Evea Sheek controller medicine.  Her symptoms have gotten progressively worse over the past few days.  She notes that her daughter has had Virgen Belland cold recently.  She notes chest discomfort with her cough.   ED Course: Labs, CXR, magnesium, duonebs.  Received solumedrol and duonebs with EMS.  Admit for COPD exacerbation.  Review of Systems: As per HPI otherwise 10 point review of systems negative.   Past Medical History:  Diagnosis Date  . COPD (chronic obstructive pulmonary disease) ([place])   . Diabetes mellitus without complication ([place])   . Stroke [facility])     Past Surgical History:  Procedure Laterality Date  . BELOW KNEE LEG AMPUTATION Right   . CORONARY ARTERY BYPASS GRAFT    . TUBAL LIGATION       reports that she has quit smoking. Her smoking use included cigarettes. She has Roxene Alviar 3.00 pack-year smoking history. She has never used smokeless tobacco. She reports that she does not drink alcohol or use drugs.  Allergies  Allergen Reactions  . Penicillins     Has patient had Gimena Buick PCN reaction causing immediate rash,  facial/tongue/throat swelling, SOB or lightheadedness with hypotension: YES Has patient had Natalee Tomkiewicz PCN reaction causing severe rash involving mucus membranes or skin necrosis: NO Has patient had Valentine Barney PCN reaction that required hospitalization: YES Has patient had Mava Suares PCN reaction occurring within the last 10 years: NO If all of the above answers are "NO", then may proceed with Cephalosporin use.  . Tape Rash    Skin Irritation    Family History  Problem Relation Age of Onset  . Heart disease Mother   . Diabetes Mother   . Heart disease Father   . Diabetes Father    Prior to Admission medications   Medication Sig Start Date End Date Taking? Authorizing Provider  albuterol (PROVENTIL HFA;VENTOLIN HFA) 108 (90 Base) MCG/ACT inhaler Inhale 2 puffs into the lungs every 6 (six) hours as needed for wheezing or shortness of breath.   Yes [provider]  ALPRAZolam Duanne Moron) 1 MG tablet Take 1 mg by mouth 3 (three) times daily as needed for anxiety.    Yes [provider]  amLODipine (NORVASC) 10 MG tablet Take 10 mg by mouth daily.    Yes [provider]  aspirin EC 81 MG tablet Take 81 mg by mouth daily.   Yes [provider]  atorvastatin (LIPITOR) 40 MG tablet Take 40 mg by mouth at bedtime.    Yes [provider]  carvedilol (COREG) 25 MG tablet Take 25 mg by mouth 2 (  two) times daily.    Yes [provider]  cyanocobalamin (,VITAMIN B-12,) 1000 MCG/ML injection Inject 1 mL into the muscle every 30 (thirty) days. 12/08/17  Yes [provider]  Cyanocobalamin (VITAMIN B 12 PO) Take 1,000 mg by mouth daily. Gummy tablet    Yes [provider]  diphenhydrAMINE (BENADRYL) 25 MG tablet Take 12.5 mg by mouth every 6 (six) hours as needed for allergies.   Yes [provider]  docusate sodium (COLACE) 100 MG capsule Take 100 mg by mouth as needed (Constipation).    Yes [provider]  DULoxetine (CYMBALTA) 30 MG capsule  Take 30 mg by mouth daily. 04/22/17  Yes [provider]  gabapentin (NEURONTIN) 300 MG capsule Take 300 mg by mouth at bedtime. 03/25/17  Yes [provider]  isosorbide mononitrate (IMDUR) 30 MG 24 hr tablet Take 30 mg by mouth daily.  04/03/17  Yes [provider]  oxyCODONE-acetaminophen (PERCOCET) 10-325 MG tablet Take 1 tablet by mouth every 6 (six) hours as needed for pain.   Yes [provider]  PRESCRIPTION MEDICATION Apply 1 application topically as needed (Itching). Medicated Itching Cream   Yes [provider]  valACYclovir (VALTREX) 500 MG tablet Take 500 mg by mouth daily. 11/26/17  Yes [provider]    Physical Exam: Vitals:   12/25/17 1400 12/25/17 1415 12/25/17 1430 12/25/17 1515  BP: (!) 145/68  (!) 154/86 (!) 163/74  Pulse: 77 79 78 77  Resp: (!) 9 16 (!) 21   Temp:    98.3 F (36.8 C)  TempSrc:    Oral  SpO2: 97% 99% 98% 100%  Weight:        Constitutional: NAD, calm, comfortable Vitals:   12/25/17 1400 12/25/17 1415 12/25/17 1430 12/25/17 1515  BP: (!) 145/68  (!) 154/86 (!) 163/74  Pulse: 77 79 78 77  Resp: (!) 9 16 (!) 21   Temp:    98.3 F (36.8 C)  TempSrc:    Oral  SpO2: 97% 99% 98% 100%  Weight:       Eyes: PERRL, lids and conjunctivae normal ENMT: Mucous membranes are moist. Posterior pharynx clear of any exudate or lesions.Normal dentition.  Neck: normal, supple, no masses, no thyromegaly Respiratory: scattered wheezing throughout, on 2 L Crisp, appears relatively comfortable without increased WOB Cardiovascular: Regular rate and rhythm, no murmurs / rubs / gallops. No extremity edema. 2+ pedal pulses. No carotid bruits.  Abdomen: no tenderness, no masses palpated. No hepatosplenomegaly. Bowel sounds positive.  Musculoskeletal: no clubbing / cyanosis. No joint deformity upper and lower extremities. Good ROM, no contractures. Normal muscle tone.  R BKA ampuitation Skin: no rashes, lesions, ulcers. No  induration Neurologic: CN 2-12 grossly intact. Sensation intact. Strength 5/5 in all 4.  Psychiatric: Normal judgment and insight. Alert and oriented x 3. Normal mood.   Labs on Admission: I have personally reviewed following labs and imaging studies  CBC: Recent Labs  Lab 12/25/17 1047  WBC 5.4  NEUTROABS 2.8  HGB 12.2  HCT 40.9  MCV 90.9  PLT 622   Basic Metabolic Panel: Recent Labs  Lab 12/25/17 1047  NA 139  K 4.9  CL 106  CO2 23  GLUCOSE 164*  BUN 40*  CREATININE 2.93*  CALCIUM 8.8*   GFR: Estimated Creatinine Clearance: 15.2 mL/min (Asiya Cutbirth) (by C-G formula based on SCr of 2.93 mg/dL (H)). Liver Function Tests: Recent Labs  Lab 12/25/17 1047  AST 15  ALT 10  ALKPHOS 77  BILITOT 0.3  PROT 6.9  ALBUMIN 2.7*   No results for input(s): LIPASE, AMYLASE in the last 168 hours. No results for input(s): AMMONIA in the last 168 hours. Coagulation Profile: No results for input(s): INR, PROTIME in the last 168 hours. Cardiac Enzymes: No results for input(s): CKTOTAL, CKMB, CKMBINDEX, TROPONINI in the last 168 hours. BNP (last 3 results) No results for input(s): PROBNP in the last 8760 hours. HbA1C: No results for input(s): HGBA1C in the last 72 hours. CBG: No results for input(s): GLUCAP in the last 168 hours. Lipid Profile: No results for input(s): CHOL, HDL, LDLCALC, TRIG, CHOLHDL, LDLDIRECT in the last 72 hours. Thyroid Function Tests: No results for input(s): TSH, T4TOTAL, FREET4, T3FREE, THYROIDAB in the last 72 hours. Anemia Panel: No results for input(s): VITAMINB12, FOLATE, FERRITIN, TIBC, IRON, RETICCTPCT in the last 72 hours. Urine analysis:    Component Value Date/Time   COLORURINE YELLOW 04/30/2017 1338   APPEARANCEUR HAZY (Leiby Pigeon) 04/30/2017 1338   LABSPEC 1.006 04/30/2017 1338   PHURINE 6.0 04/30/2017 1338   GLUCOSEU NEGATIVE 04/30/2017 1338   HGBUR NEGATIVE 04/30/2017 1338   BILIRUBINUR NEGATIVE 04/30/2017 1338   KETONESUR NEGATIVE 04/30/2017 1338     PROTEINUR 100 (Genine Beckett) 04/30/2017 1338   NITRITE NEGATIVE 04/30/2017 1338   LEUKOCYTESUR NEGATIVE 04/30/2017 1338    Radiological Exams on Admission: Dg Chest 2 View  Result Date: 12/25/2017 CLINICAL DATA:  Onset severe shortness of breath, coughing and wheezing today. EXAM: CHEST - 2 VIEW COMPARISON:  PA and lateral chest 11/06/2017 and 04/29/2017. FINDINGS: There is some peribronchial thickening. No consolidative process, pneumothorax or effusion. Heart size is upper normal. The patient is status post CABG. No acute or focal abnormality. IMPRESSION: Bronchitic change without focal abnormality. Electronically Signed   By: Inge Rise M.D.   On: 12/25/2017 12:09    EKG: Independently reviewed. EKG with NSR, appears c/w priors.  Stable old Q waves in V1-V3.  Assessment/Plan Active Problems:   COPD exacerbation (HCC)  COPD Exacerbation:  Worsening and persistent symptoms over the past month, worse over past day.  Received solumedrol, duonebs with EMS.  Received duonebs and magnesium here in ED.  With persistent wheezing on my evaluation.  CXR without focal abnormality.  Scheduled and prn nebs IV solumedrol, transition to prednisone in AM Doxycycline She's not on any controller medication, this should be added at discharge  CAD  S/p CABG: continue ASA, coreg, lipitor, imdur  CKD IV: creatinine today 2.93, which is similar to creatinine in 10/2017 of 3.08.  Prior to this, her creatinine was ~2.2.  She needs follow up with outpatient nephrologist.    T2DM: last A1c 6.7, follow repeat.  SSI.  Not on any meds at home.  Hypertension: continue amlodipine, coreg, imdur  Right BKA: stable  B12 deficiency: pt receives monthly injections (she notes she's no longer taking PO, though this was on her list and she her and daughter disagreed about this)  Continue gabapentin  DVT prophylaxis: heparin Code Status: fulld  Family Communication: son and daughter at bedside  Disposition Plan:  pending improvement Consults called: none  Admission status: inpatient given COPD with acute hypoxic respiratory failure and continued wheezing on exam.  It is my medical opinion that she'll require admission for at least 2 midnights.    Fayrene Helper MD [facility] Pager [phone]  If 7PM-7AM, please contact night-coverage [email or web address] Password [facility]  12/25/2017, 6:23 PM

## 2017-12-25 NOTE — ED Triage Notes (Signed)
Pt arrives via [facility] from home. Pt reports increased SOB for 1 week. Pt reports that it worsened this am. EMS reports a RA sat of 88%. Pt has HX of COPD. EMS administered 10mg  Albuterol, 0.5 Atrovent and 125mg  Solu-Medrol IV. Pt has audible wheezing

## 2017-12-25 NOTE — ED Notes (Signed)
2L O2 placed on pt for comfort. Pts RA sat was 95% but pt reports O2 made her more comfortable.

## 2017-12-25 NOTE — ED Notes (Signed)
Patient transported to X-ray 

## 2017-12-25 NOTE — ED Provider Notes (Signed)
[facility] DEPT Provider Note   CSN: [id number] Arrival date & time: 12/25/17  0946     History   Chief Complaint Chief Complaint  Patient presents with  . Shortness of Breath    HPI Rebecca Benton is a 67 y.o. female.  The history is provided by the patient and medical records. No language interpreter was used.  Shortness of Breath  This is a recurrent problem. The average episode lasts 1 week. The problem occurs continuously.The current episode started more than 2 days ago. The problem has been gradually worsening. Associated symptoms include cough and wheezing. Pertinent negatives include no fever, no headaches, no rhinorrhea, no neck pain, no sputum production, no hemoptysis, no chest pain, no syncope, no vomiting, no abdominal pain, no rash, no leg pain, no leg swelling and no claudication. She has tried beta-agonist inhalers for the symptoms. The treatment provided no relief. She has had prior hospitalizations. Associated medical issues include COPD and CAD.    Past Medical History:  Diagnosis Date  . COPD (chronic obstructive pulmonary disease) ([place])   . Diabetes mellitus without complication ([place])   . Stroke [facility])     Patient Active Problem List   Diagnosis Date Noted  . COPD exacerbation ([place])   . Rhinovirus infection   . Chronic renal failure, stage 4 (severe) (H[facility])   . Hyperkalemia   . Dysuria   . Asthma exacerbation 04/29/2017    Past Surgical History:  Procedure Laterality Date  . BELOW KNEE LEG AMPUTATION Right   . CORONARY ARTERY BYPASS GRAFT    . TUBAL LIGATION       OB History   None      Home Medications    Prior to Admission medications   Medication Sig Start Date End Date Taking? Authorizing Provider  albuterol (PROVENTIL HFA;VENTOLIN HFA) 108 (90 Base) MCG/ACT inhaler Inhale 2 puffs into the lungs every 6 (six) hours as needed for wheezing or shortness of breath.    [provider]  ALPRAZolam Duanne Moron)  1 MG tablet Take 1 mg by mouth 3 (three) times daily.     [provider]  amLODipine (NORVASC) 10 MG tablet Take 1 tablet by mouth 2 (two) times daily.     [provider]  atorvastatin (LIPITOR) 40 MG tablet Take 1 tablet by mouth at bedtime.    [provider]  carvedilol (COREG) 25 MG tablet Take 25 mg by mouth 2 (two) times daily.     [provider]  clopidogrel (PLAVIX) 75 MG tablet Take 75 mg by mouth daily. 04/03/17   [provider]  Cyanocobalamin (VITAMIN B 12 PO) Take 1,000 mg by mouth daily. Gummy tablet     [provider]  diphenhydrAMINE (BENADRYL) 25 MG tablet Take 12.5 mg by mouth every 6 (six) hours as needed for allergies.    [provider]  docusate sodium (COLACE) 100 MG capsule Take 100 mg by mouth as needed (Constipation).     [provider]  DULoxetine (CYMBALTA) 30 MG capsule Take 30 mg by mouth daily. 04/22/17   [provider]  gabapentin (NEURONTIN) 300 MG capsule Take 300 mg by mouth at bedtime. 03/25/17   [provider]  isosorbide mononitrate (IMDUR) 30 MG 24 hr tablet Take 30 mg by mouth 2 (two) times daily. 04/03/17   [provider]  oxyCODONE-acetaminophen (PERCOCET) 10-325 MG tablet Take 1 tablet by mouth every 6 (six) hours as needed for pain.  [provider]  PRESCRIPTION MEDICATION Apply 1 application topically as needed (Itching). Medicated Itching Cream    [provider]    Family History Family History  Problem Relation Age of Onset  . Heart disease Mother   . Diabetes Mother   . Heart disease Father   . Diabetes Father     Social History Social History   Tobacco Use  . Smoking status: Former Smoker    Packs/day: 0.10    Years: 30.00    Pack years: 3.00    Types: Cigarettes  . Smokeless tobacco: Never Used  Substance Use Topics  . Alcohol use: No  . Drug use: No     Allergies   Penicillins and Tape   Review of  Systems Review of Systems  Constitutional: Positive for fatigue. Negative for chills, diaphoresis and fever.  HENT: Negative for congestion and rhinorrhea.   Eyes: Negative for visual disturbance.  Respiratory: Positive for cough, chest tightness, shortness of breath and wheezing. Negative for hemoptysis, sputum production, choking and stridor.   Cardiovascular: Negative for chest pain, palpitations, claudication, leg swelling and syncope.  Gastrointestinal: Negative for abdominal pain, constipation, diarrhea, nausea and vomiting.  Genitourinary: Negative for dysuria, flank pain and frequency.  Musculoskeletal: Negative for back pain, neck pain and neck stiffness.  Skin: Negative for rash and wound.  Neurological: Negative for light-headedness and headaches.  Psychiatric/Behavioral: Negative for agitation.  All other systems reviewed and are negative.    Physical Exam Updated Vital Signs BP (!) 164/72 (BP Location: Left Arm)   Pulse 74   Temp (!) 97.4 F (36.3 C) (Oral)   Resp (!) 24   Wt 59 kg   SpO2 96%   BMI 24.56 kg/m   Physical Exam  Constitutional: She appears well-developed and well-nourished.  Non-toxic appearance. She does not appear ill. No distress.  HENT:  Head: Normocephalic and atraumatic.  Mouth/Throat: Oropharynx is clear and moist.  Eyes: Pupils are equal, round, and reactive to light. Conjunctivae are normal.  Neck: Neck supple.  Cardiovascular: Normal rate and regular rhythm.  No murmur heard. Pulmonary/Chest: Effort normal. No respiratory distress. She has wheezes. She has no rhonchi. She has no rales.  Abdominal: Soft. There is no tenderness.  Musculoskeletal: She exhibits no edema.       Left lower leg: She exhibits no tenderness and no edema.  Neurological: She is alert.  Skin: Skin is warm and dry. Capillary refill takes less than 2 seconds.  Psychiatric: She has a normal mood and affect.  Nursing note and vitals reviewed.    ED Treatments /  Results  Labs (all labs ordered are listed, but only abnormal results are displayed) Labs Reviewed  CBC WITH DIFFERENTIAL/PLATELET - Abnormal; Notable for the following components:      Result Value   MCHC 29.8 (*)    Eosinophils Absolute 1.0 (*)    All other components within normal limits  COMPREHENSIVE METABOLIC PANEL - Abnormal; Notable for the following components:   Glucose, Bld 164 (*)    BUN 40 (*)    Creatinine, Ser 2.93 (*)    Calcium 8.8 (*)    Albumin 2.7 (*)    GFR calc non Af Amer 15 (*)    GFR calc Af Amer 18 (*)    All other components within normal limits  I-STAT TROPONIN, ED  I-STAT TROPONIN, ED    EKG EKG Interpretation  Date/Time:  Thursday December 25 2017 09:59:48 EDT Ventricular Rate:  72 PR  Interval:    QRS Duration: 86 QT Interval:  427 QTC Calculation: 468 R Axis:   59 Text Interpretation:  Sinus rhythm Anteroseptal infarct, old Baseline wander in lead(s) V6 When compared to prior, no significant cvhanges seen.  No STEMI Confirmed by Antony Blackbird [phone]) on 12/25/2017 10:25:31 AM   Radiology Dg Chest 2 View  Result Date: 12/25/2017 CLINICAL DATA:  Onset severe shortness of breath, coughing and wheezing today. EXAM: CHEST - 2 VIEW COMPARISON:  PA and lateral chest 11/06/2017 and 04/29/2017. FINDINGS: There is some peribronchial thickening. No consolidative process, pneumothorax or effusion. Heart size is upper normal. The patient is status post CABG. No acute or focal abnormality. IMPRESSION: Bronchitic change without focal abnormality. Electronically Signed   By: Inge Rise M.D.   On: 12/25/2017 12:09    Procedures Procedures (including critical care time)  CRITICAL CARE Performed by: Gwenyth Allegra Tegeler Total critical care time: 35 minutes Critical care time was exclusive of separately billable procedures and treating other patients. Critical care was necessary to treat or prevent imminent or life-threatening  deterioration. Critical care was time spent personally by me on the following activities: development of treatment plan with patient and/or surrogate as well as nursing, discussions with consultants, evaluation of patient's response to treatment, examination of patient, obtaining history from patient or surrogate, ordering and performing treatments and interventions, ordering and review of laboratory studies, ordering and review of radiographic studies, pulse oximetry and re-evaluation of patient's condition. \  Medications Ordered in ED Medications  ipratropium-albuterol (DUONEB) 0.5-2.5 (3) MG/3ML nebulizer solution 3 mL (has no administration in time range)  magnesium sulfate IVPB 2 g 50 mL (0 g Intravenous Stopped 12/25/17 1153)  ipratropium-albuterol (DUONEB) 0.5-2.5 (3) MG/3ML nebulizer solution 3 mL (3 mLs Nebulization Given 12/25/17 1058)     Initial Impression / Assessment and Plan / ED Course  I have reviewed the triage vital signs and the nursing notes.  Pertinent labs & imaging results that were available during my care of the patient were reviewed by me and considered in my medical decision making (see chart for details).     Rebecca Benton is a 67 y.o. female with past medical history significant for CAD status post CABG, diabetes, COPD, stroke, CKD, and right below the knee amputation who presents with wheezing, shortness of breath, nonproductive cough, and chest tightness.  Patient reports that for the last week she has had gradually worsening shortness of breath.  She reports that today she called EMS when she could not catch her breath.  She reports she is used home breathing treatments without success.  Patient was found to have oxygen saturation in the 80s on room air at home and she does not take oxygen normally.  Patient was given a DuoNeb and Solu-Medrol in route and is on 3 L nasal cannula for oxygen supplementation on arrival.  Patient reports she is having chest tightness and  chest pressure.  She says this feels like prior COPD exacerbations.  She reports cough but no production.  She denies fevers or chills.  She denies any nausea, vomiting, diaphoresis.  She thinks her left leg is not swollen.  She denies history of DVT or PE.  She reports feeling very fatigued and tired.  She denies radiation of the pain or associated symptoms.    On exam, patient has wheezing in all lung fields.  Patient's not tachycardic.  Abdomen is nontender.  Chest is nontender.  Patient's left leg is not edematous and right  leg is prosthetic.  No rhonchi appreciated.  Poor air movement.  Patient is on nasal cannula oxygen.    EKG shows no STEMI.  Patient is not tachycardic.    I suspect patient has COPD exacerbation.  Patient will be given magnesium and another DuoNeb treatment.  Patient already received Solu-Medrol with EMS.  Patient will have screening laboratory testing including a troponin and chest x-ray given her history of CAD.  Given her new oxygen requirement, anticipate admission for COPD exacerbation.  1:12 PM Patient reassessed after magnesium in the steroids having time to work and is still wheezing significantly.  Patient still on 2 L nasal cannula when she does not take oxygen at home normally.  Work-up was overall reassuring with improved kidney function and no leukocytosis.  Troponin was normal.  Chest x-ray did not show pneumonia.  Suspect COPD exacerbation.    Given the patient's oxygen requirement and continued wheezing, patient will be admitted for further management of COPD exacerbation.  Hospitalist team called for admission.   Final Clinical Impressions(s) / ED Diagnoses   Final diagnoses:  COPD exacerbation [facility])    ED Discharge Orders    None      Clinical Impression: 1. COPD exacerbation ([place])     Disposition: Admit  This note was prepared with assistance of Dragon voice recognition software. Occasional wrong-word or sound-a-like substitutions may have  occurred due to the inherent limitations of voice recognition software.     Tegeler, Gwenyth Allegra, MD 12/25/17 2010

## 2017-12-26 LAB — COMPREHENSIVE METABOLIC PANEL
ALT: 10 U/L (ref 0–44)
AST: 12 U/L — AB (ref 15–41)
Albumin: 2.5 g/dL — ABNORMAL LOW (ref 3.5–5.0)
Alkaline Phosphatase: 73 U/L (ref 38–126)
Anion gap: 13 (ref 5–15)
BILIRUBIN TOTAL: 0.4 mg/dL (ref 0.3–1.2)
BUN: 57 mg/dL — AB (ref 8–23)
CO2: 19 mmol/L — ABNORMAL LOW (ref 22–32)
CREATININE: 3.48 mg/dL — AB (ref 0.44–1.00)
Calcium: 9 mg/dL (ref 8.9–10.3)
Chloride: 106 mmol/L (ref 98–111)
GFR calc Af Amer: 15 mL/min — ABNORMAL LOW (ref >60)
GFR, EST NON AFRICAN AMERICAN: 13 mL/min — AB (ref >60)
Glucose, Bld: 228 mg/dL — ABNORMAL HIGH (ref 70–99)
Potassium: 5.3 mmol/L — ABNORMAL HIGH (ref 3.5–5.1)
Sodium: 138 mmol/L (ref 135–145)
TOTAL PROTEIN: 6.3 g/dL — AB (ref 6.5–8.1)

## 2017-12-26 LAB — GLUCOSE, CAPILLARY
GLUCOSE-CAPILLARY: 185 mg/dL — AB (ref 70–99)
GLUCOSE-CAPILLARY: 241 mg/dL — AB (ref 70–99)
Glucose-Capillary: 220 mg/dL — ABNORMAL HIGH (ref 70–99)
Glucose-Capillary: 294 mg/dL — ABNORMAL HIGH (ref 70–99)
Glucose-Capillary: 173 mg/dL — ABNORMAL HIGH (ref 70–99)
Glucose-Capillary: 224 mg/dL — ABNORMAL HIGH (ref 70–99)

## 2017-12-26 LAB — HIV ANTIBODY (ROUTINE TESTING W REFLEX): HIV SCREEN 4TH GENERATION: NONREACTIVE

## 2017-12-26 LAB — CBC
HCT: 37.3 % (ref 36.0–46.0)
Hemoglobin: 11 g/dL — ABNORMAL LOW (ref 12.0–15.0)
MCH: 26.8 pg (ref 26.0–34.0)
MCHC: 29.5 g/dL — AB (ref 30.0–36.0)
MCV: 90.8 fL (ref 80.0–100.0)
Platelets: 242 10*3/uL (ref 150–400)
RBC: 4.11 MIL/uL (ref 3.87–5.11)
RDW: 14.5 % (ref 11.5–15.5)
WBC: 2.7 10*3/uL — AB (ref 4.0–10.5)
nRBC: 0 % (ref 0.0–0.2)

## 2017-12-26 LAB — HEMOGLOBIN A1C
HEMOGLOBIN A1C: 7.7 % — AB (ref 4.8–5.6)
MEAN PLASMA GLUCOSE: 174.29 mg/dL

## 2017-12-26 MED ORDER — GUAIFENESIN-DM 100-10 MG/5ML PO SYRP: 5.0000 mL | ORAL_SOLUTION | ORAL | Status: DC | PRN

## 2017-12-26 MED ORDER — GLUCERNA SHAKE PO LIQD
237.0000 mL | Freq: Two times a day (BID) | ORAL | Status: DC
  Administered 2017-12-26 – 2017-12-27 (×2): 237 mL via ORAL
  Filled 2017-12-26 (×3): qty 237

## 2017-12-26 MED ORDER — SODIUM CHLORIDE 0.45 % IV SOLN
INTRAVENOUS | Status: DC
  Administered 2017-12-26 – 2017-12-27 (×2): via INTRAVENOUS

## 2017-12-26 MED ORDER — DOXYCYCLINE HYCLATE 100 MG PO TABS
100.0000 mg | ORAL_TABLET | Freq: Two times a day (BID) | ORAL | Status: DC
  Administered 2017-12-26 – 2017-12-27 (×2): 100 mg via ORAL
  Filled 2017-12-26 (×2): qty 1

## 2017-12-26 MED ORDER — IPRATROPIUM-ALBUTEROL 0.5-2.5 (3) MG/3ML IN SOLN: 3.0000 mL | RESPIRATORY_TRACT | Status: DC | PRN

## 2017-12-26 NOTE — Evaluation (Signed)
Occupational Therapy Evaluation Patient Details Name: Rebecca Benton MRN: [id number] DOB: Jan 19, 1951 Today's Date: 12/26/2017    History of Present Illness Pt was admitted for COPD exacerbation.  PMH:  CAD, CABG, DM, R BKA, CVA   Clinical Impression   This 67 year old female was admitted for the above.  At baseline she is mod I and she reports that she helps her daughter.  Pt performs all IADLs independently.  Pt currently needs min guard for safety.  Goals in acute setting are for mod I level    Follow Up Recommendations  No OT follow up    Equipment Recommendations  None recommended by OT    Recommendations for Other Services       Precautions / Restrictions Precautions Precautions: Fall Restrictions Weight Bearing Restrictions: No      Mobility Bed Mobility               General bed mobility comments: in recliner  Transfers Overall transfer level: Needs assistance Equipment used: None Transfers: Sit to/from Stand Sit to Stand: Min guard         General transfer comment: for safety    Balance                                           ADL either performed or assessed with clinical judgement   ADL Overall ADL's : Needs assistance/impaired     Grooming: Wash/dry hands;Supervision/safety;Standing       Lower Body Bathing: Min guard;Sit to/from stand       Lower Body Dressing: Min guard;Sit to/from stand   Toilet Transfer: Min guard;Ambulation;Comfort height toilet   Toileting- Clothing Manipulation and Hygiene: Min guard;Sit to/from stand         General ADL Comments: Pt donned prosthetic leg and used bathroom; min guard for safety to gather ADL items.  Pt assists her daughter at baseline. Daughter is undergoing dialysis     Vision         Perception     Praxis      Pertinent Vitals/Pain Pain Assessment: No/denies pain     Hand Dominance     Extremity/Trunk Assessment Upper Extremity Assessment Upper Extremity  Assessment: Overall WFL for tasks assessed           Communication Communication Communication: (dysarthric)   Cognition Arousal/Alertness: Awake/alert Behavior During Therapy: WFL for tasks assessed/performed Overall Cognitive Status: Within Functional Limits for tasks assessed                                     General Comments  sats 94-98% on RA    Exercises     Shoulder Instructions      Home Living Family/patient expects to be discharged to:: Private residence Living Arrangements: Children                 Bathroom Shower/Tub: Teacher, early years/pre: Handicapped height     Home Equipment: Shower seat   Additional Comments: pt helps daughter      Prior Functioning/Environment Level of Independence: Independent        Comments: pt independent with iadls        OT Problem List: Decreased activity tolerance;Impaired balance (sitting and/or standing)      OT Treatment/Interventions: Self-care/ADL training;Energy conservation;DME and/or  AE instruction;Patient/family education;Balance training    OT Goals(Current goals can be found in the care plan section) Acute Rehab OT Goals Patient Stated Goal: have a little help at home, like for shopping OT Goal Formulation: With patient Time For Goal Achievement: 01/09/18 Potential to Achieve Goals: Good ADL Goals Pt Will Transfer to Toilet: with modified independence;ambulating;regular height toilet Additional ADL Goal #1: pt will gather clothes at mod I level  OT Frequency: Min 2X/week   Barriers to D/C:            Co-evaluation              AM-PAC PT "6 Clicks" Daily Activity     Outcome Measure Help from another person eating meals?: None Help from another person taking care of personal grooming?: A Little Help from another person toileting, which includes using toliet, bedpan, or urinal?: A Little Help from another person bathing (including washing, rinsing,  drying)?: A Little Help from another person to put on and taking off regular upper body clothing?: A Little Help from another person to put on and taking off regular lower body clothing?: A Little 6 Click Score: 19   End of Session    Activity Tolerance: Patient tolerated treatment well Patient left: in bed;with call bell/phone within reach;with chair alarm set  OT Visit Diagnosis: Muscle weakness (generalized) (M62.81)                Time: 4967-5916 OT Time Calculation (min): 23 min Charges:  OT General Charges $OT Visit: 1 Visit OT Evaluation $OT Eval Low Complexity: [place], OTR/L Acute Rehabilitation Services [phone] [facility] pager [phone] office 12/26/2017  Rebecca Benton 12/26/2017, 1:07 PM

## 2017-12-26 NOTE — Progress Notes (Addendum)
PROGRESS NOTE    Rebecca Benton  INO:[id number] DOB: 12-Jun-1950 DOA: 12/25/2017 PCP: Lin Landsman, MD  Brief Narrative:67 y.o. female with medical history significant of coronary artery disease status post CABG in 2016, COPD, diabetes, hypertension, right BKA in 2017, multiple other problems presenting with persistent shortness of breath over the past month.   Patient notes that she has been having trouble breathing on and off for the past month.  She notes that it keeps coming back.  She is notes a history of COPD for 3 to 4 years.  She complains of cough as well as shortness of breath and wheezing.  She denies any recent treatment with steroids.  She notes that she is just been treated with breathing treatments.  She is not on a controller medicine.  Her symptoms have gotten progressively worse over the past few days.  She notes that her daughter has had a cold recently.  She notes chest discomfort with her cough.   ED Course: Labs, CXR, magnesium, duonebs.  Received solumedrol and duonebs with EMS.  Admit for COPD exacerbation.  Assessment & Plan:   Active Problems:   COPD exacerbation ([facility])   COPD Exacerbation:  Worsening and persistent symptoms over the past month, worse over past day.  Received solumedrol, duonebs with EMS.  Received duonebs and magnesium here in ED.  With persistent wheezing on my evaluation.  CXR without focal abnormality.  Patient refusing steroid treatments due to high blood sugar.  I explained to her that this is just a transient hyperglycemia however she is adamant she will not take any more steroids.  Will continue scheduled and as needed nebulizers.  Ambulate patient check for pulse oximetry after exertion or ambulation.  She is still wheezy and tachypneic will treat her overnight with ongoing nebulizer treatments and plan for discharge tomorrow.   CAD  S/p CABG: continue ASA, coreg, lipitor, imdur  CKD IV: creatinine today 3.48 which is slightly higher than her  baseline.  Patient will follow-up with nephrologist as an outpatient.    The time of admission her creatinine was 2.98.  Monitor renal functions avoid nephrotoxins.  Creatinine today is 3.48 which is a high jump from yesterday.  She does appear to be dehydrated with decreased p.o. intake we will give her slow IV hydration and recheck labs tomorrow.  T2DM: last A1c 6.7, follow repeat.  SSI.  Not on any meds at home.  Hypertension: continue amlodipine, coreg, imdur  Right BKA: stable  B12 deficiency: pt receives monthly injections (she notes she's no longer taking PO, though this was on her list and she her and daughter disagreed about this)  Continue gabapentin    DVT prophylaxis: Heparin Code Status: Full code  Family Communication: None Disposition Plan:  TBD  Consultants: None  Procedures: None Antimicrobials None Subjective she is very adamant that she will not take any steroids as it causes her sugar to go up.  She is short of breath.  O2 dependent at 2 L.  She does not have oxygen at home.  Objective: Vitals:   12/25/17 2047 12/25/17 2155 12/26/17 0438 12/26/17 0807  BP:  121/62 139/72   Pulse:  66 70   Resp:  20 20   Temp:  98 F (36.7 C) 97.7 F (36.5 C)   TempSrc:   Oral   SpO2: 98% 97% 99% 92%  Weight:        Intake/Output Summary (Last 24 hours) at 12/26/2017 1223 Last data filed at 12/25/2017 1600  Gross per 24 hour  Intake 120 ml  Output -  Net 120 ml   Filed Weights   12/25/17 0959  Weight: 59 kg    Examination:  General exam: Appears calm and comfortable  Respiratory system: Diffuse wheezing bilaterally to auscultation. Respiratory effort normal. Cardiovascular system: S1 & S2 heard, RRR. No JVD, murmurs, rubs, gallops or clicks. No pedal edema. Gastrointestinal system: Abdomen is nondistended, soft and nontender. No organomegaly or masses felt. Normal bowel sounds heard. Central nervous system: Alert and oriented. No focal neurological  deficits. Extremities: Symmetric 5 x 5 power. Skin: No rashes, lesions or ulcers Psychiatry: Judgement and insight appear normal. Mood & affect appropriate.     Data Reviewed: I have personally reviewed following labs and imaging studies  CBC: Recent Labs  Lab 12/25/17 1047 12/26/17 0447  WBC 5.4 2.7*  NEUTROABS 2.8  --   HGB 12.2 11.0*  HCT 40.9 37.3  MCV 90.9 90.8  PLT 235 497   Basic Metabolic Panel: Recent Labs  Lab 12/25/17 1047 12/26/17 0447  NA 139 138  K 4.9 5.3*  CL 106 106  CO2 23 19*  GLUCOSE 164* 228*  BUN 40* 57*  CREATININE 2.93* 3.48*  CALCIUM 8.8* 9.0   GFR: Estimated Creatinine Clearance: 12.8 mL/min (A) (by C-G formula based on SCr of 3.48 mg/dL (H)). Liver Function Tests: Recent Labs  Lab 12/25/17 1047 12/26/17 0447  AST 15 12*  ALT 10 10  ALKPHOS 77 73  BILITOT 0.3 0.4  PROT 6.9 6.3*  ALBUMIN 2.7* 2.5*   No results for input(s): LIPASE, AMYLASE in the last 168 hours. No results for input(s): AMMONIA in the last 168 hours. Coagulation Profile: No results for input(s): INR, PROTIME in the last 168 hours. Cardiac Enzymes: No results for input(s): CKTOTAL, CKMB, CKMBINDEX, TROPONINI in the last 168 hours. BNP (last 3 results) No results for input(s): PROBNP in the last 8760 hours. HbA1C: Recent Labs    12/25/17 1050  HGBA1C 7.7*   CBG: Recent Labs  Lab 12/25/17 2156 12/26/17 0735 12/26/17 1151  GLUCAP 372* 220* 294*   Lipid Profile: No results for input(s): CHOL, HDL, LDLCALC, TRIG, CHOLHDL, LDLDIRECT in the last 72 hours. Thyroid Function Tests: No results for input(s): TSH, T4TOTAL, FREET4, T3FREE, THYROIDAB in the last 72 hours. Anemia Panel: No results for input(s): VITAMINB12, FOLATE, FERRITIN, TIBC, IRON, RETICCTPCT in the last 72 hours. Sepsis Labs: No results for input(s): PROCALCITON, LATICACIDVEN in the last 168 hours.  No results found for this or any previous visit (from the past 240 hour(s)).        Radiology Studies: Dg Chest 2 View  Result Date: 12/25/2017 CLINICAL DATA:  Onset severe shortness of breath, coughing and wheezing today. EXAM: CHEST - 2 VIEW COMPARISON:  PA and lateral chest 11/06/2017 and 04/29/2017. FINDINGS: There is some peribronchial thickening. No consolidative process, pneumothorax or effusion. Heart size is upper normal. The patient is status post CABG. No acute or focal abnormality. IMPRESSION: Bronchitic change without focal abnormality. Electronically Signed   By: Inge Rise M.D.   On: 12/25/2017 12:09        Scheduled Meds: . amLODipine  10 mg Oral Daily  . aspirin EC  81 mg Oral Daily  . atorvastatin  40 mg Oral QHS  . carvedilol  25 mg Oral BID  . doxycycline  100 mg Oral Q12H  . DULoxetine  30 mg Oral Daily  . gabapentin  300 mg Oral QHS  . heparin  5,000 Units Subcutaneous Q8H  . insulin aspart  0-5 Units Subcutaneous QHS  . insulin aspart  0-9 Units Subcutaneous TID WC  . ipratropium-albuterol  3 mL Nebulization TID  . isosorbide mononitrate  30 mg Oral Daily  . valACYclovir  500 mg Oral Daily   Continuous Infusions:   LOS: 1 day    Georgette Shell, MD [facility]  If 7PM-7AM, please contact night-coverage [email or web address] Password [facility] 12/26/2017, 12:23 PM

## 2017-12-26 NOTE — Evaluation (Signed)
Physical Therapy Evaluation Patient Details Name: Rebecca Benton MRN: [id number] DOB: [birth year] Today's Date: 12/26/2017   History of Present Illness  Pt was admitted for COPD exacerbation.  PMH:  CAD, CABG, DM, R BKA, CVA  Clinical Impression  On eval, pt was Min guard assist for mobility. She walked ~250 feet while using hallway handrail to steady herself intermittently. O2 sat 94% on RA during session. Pt tolerated activity well. Pt plans to return home at discharge. She has access to a RW/cane already. Do not anticipate any f/u PT needs after discharge. Will follow during hospital stay.     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 Unsteady at times. Pt intermittently used hallway handrail to steady. O2 sat 94% on RA  Stairs            Wheelchair Mobility    Modified Rankin (Stroke Patients Only)       Balance Overall balance assessment: Mild deficits observed, not formally tested           Standing balance-Leahy Scale: Fair                               Pertinent Vitals/Pain Pain Assessment: No/denies pain    Home Living Family/patient expects to be  discharged to:: Private residence Living Arrangements: Children Available Help at Discharge: Family Type of Home: Mobile home Home Access: Stairs to enter Entrance Stairs-Rails: Psychiatric nurse of Steps: 5 Home Layout: One level Home Equipment: Environmental consultant - 2 wheels;Cane - single point;Shower seat Additional Comments: pt helps daughter    Prior Function Level of Independence: Independent               Hand Dominance        Extremity/Trunk Assessment   Upper Extremity Assessment Upper Extremity Assessment: Defer to OT evaluation    Lower Extremity Assessment Lower Extremity Assessment: Generalized weakness    Cervical / Trunk Assessment Cervical / Trunk Assessment: Normal  Communication   Communication: Expressive difficulties  Cognition Arousal/Alertness: Awake/alert Behavior During Therapy: WFL for tasks assessed/performed Overall Cognitive Status: Within Functional Limits for tasks assessed                                        General Comments General comments (skin integrity, edema, etc.): sats 94-98% on RA    Exercises     Assessment/Plan    PT Assessment Patient needs continued PT services  PT Problem List Decreased mobility;Decreased balance       PT Treatment Interventions Gait training;Functional mobility training;Balance training;Patient/family education;Therapeutic activities;Therapeutic exercise    PT  Goals (Current goals can be found in the Care Plan section)  Acute Rehab PT Goals Patient Stated Goal: have a little help at home, like for shopping PT Goal Formulation: With patient Time For Goal Achievement: 01/09/18 Potential to Achieve Goals: Good    Frequency Min 3X/week   Barriers to discharge        Co-evaluation               AM-PAC PT "6 Clicks" Daily Activity  Outcome Measure Difficulty turning over in bed (including adjusting bedclothes, sheets and blankets)?: None   Difficulty  sitting down on and standing up from a chair with arms (e.g., wheelchair, bedside commode, etc,.)?: None Help needed moving to and from a bed to chair (including a wheelchair)?: A Little Help needed walking in hospital room?: A Little Help needed climbing 3-5 steps with a railing? : A Little 6 Click Score: 17    End of Session Equipment Utilized During Treatment: Gait belt Activity Tolerance: Patient tolerated treatment well Patient left: in bed;with call bell/phone within reach;with bed alarm set   PT Visit Diagnosis: Unsteadiness on feet (R26.81)    Time: 4037-5436 PT Time Calculation (min) (ACUTE ONLY): 24 min   Charges:   PT Evaluation $PT Eval Moderate Complexity: [place], PT Acute Rehabilitation Services Pager: [phone] Office: [phone]

## 2017-12-26 NOTE — Progress Notes (Signed)
   12/26/17 1614  Clinical Encounter Type  Visited With Patient  Visit Type Initial  Referral From Nurse  Consult/Referral To Chaplain  Pt.'s RN asked chaplain for the Pt. spiritual care visit.  The RN shared it was the Pt. 8 year anniversary of her husband's death.  The chaplain listened to the Pt. as she reflected on multiple generations of family and the 77 years her and her husband spent together.  The Pt. shared intermittent smiles and concluded with sharing the plans of 2020 Family Reunion.  The Pt. and chaplain prayed together before the Pt. thanked the chaplain for the visit.

## 2017-12-26 NOTE — Progress Notes (Signed)
Initial Nutrition Assessment  DOCUMENTATION CODES:   Non-severe (moderate) malnutrition in context of chronic illness  INTERVENTION:  Glucerna BID: to provide 250kcal, 10g protein per serving MVI once daily   NUTRITION DIAGNOSIS:   Moderate Malnutrition related to chronic illness(COPD, CKDIV) as evidenced by mild fat depletion, mild muscle depletion.   GOAL:   Patient will meet greater than or equal to 90% of their needs   MONITOR:   PO intake, Labs, I & O's  REASON FOR ASSESSMENT:   Consult Assessment of nutrition requirement/status  ASSESSMENT:  67 yo pt admitted 10/10 w/ COPD exacerbation and persistent wheezing. PMH: CAD, CABG, rt BKA, Stroke, T2DM, CKD IV   Pt awake and nurse in room at visit. Pt has mild speech deficits and some responses were difficult to interpret. Pt reports wearing dentures on a regular basis, but they were not brought to the hospital. Denies any issues w/ chewing or swallowing.   Pt endorses a good appetite and asked if RD could place lunch order before leaving the room. Pt requested chef salad, turkey/bacon club sandwich, sugar cookie, cranberry juice, and diet soda. Reports having eggs, half of waffle, and coffee for breakfast. Denies any recent wt changes and reports feeling tired on occasion after preparing meals at home for herself. Pt stated she eats breakfast and dinner, w/ lunch on occasion; sandwich or leftovers and likes to snack on potato chips and chocolate candy.   Medications: doxyclycline, gabapentin, novoLog 0-5 at bedtime, 0-9 w/ meals  Labs: Potassium 5.3 (H), CO2 19 (L), Glucose 228 (H), BUN 57 (H), Cr 3.48 (H), Albumin 2.5 (L) GFR 15 (L) 10/10 A1c: 7.7 (H)  NUTRITION - FOCUSED PHYSICAL EXAM:    Most Recent Value  Orbital Region  Mild depletion  Upper Arm Region  Mild depletion  Thoracic and Lumbar Region  No depletion  Buccal Region  Mild depletion  Temple Region  Mild depletion  Clavicle Bone Region  Mild depletion   Clavicle and Acromion Bone Region  No depletion  Scapular Bone Region  No depletion  Dorsal Hand  Mild depletion  Patellar Region  Unable to assess  Anterior Thigh Region  No depletion  Posterior Calf Region  Unable to assess  Edema (RD Assessment)  None  Hair  Reviewed  Eyes  Reviewed  Mouth  Reviewed  Skin  Reviewed  Nails  Reviewed       Diet Order:   Diet Order            Diet regular Room service appropriate? Yes; Fluid consistency: Thin  Diet effective now              EDUCATION NEEDS:   No education needs have been identified at this time  Skin:  Skin Assessment: Reviewed RN Assessment(rt BKA)  Last BM:  10/09  Height:   Ht Readings from Last 1 Encounters:  12/26/17 5' 0.98" (1.549 m)    Weight:   Wt Readings from Last 1 Encounters:  12/25/17 59 kg    Adjusted Ideal Body Weight:  44.9 kg (98.8lb)   BMI:  Body mass index is 24.58 kg/m.  Estimated Nutritional Needs:   Kcal:  1475-1770  Protein:  50-65  Fluid:  1020mL + UP    Lajuan Lines, RD, LDN  After Hours/Weekend Pager: [phone]

## 2017-12-27 LAB — GLUCOSE, CAPILLARY
GLUCOSE-CAPILLARY: 218 mg/dL — AB (ref 70–99)
GLUCOSE-CAPILLARY: 218 mg/dL — AB (ref 70–99)
Glucose-Capillary: 119 mg/dL — ABNORMAL HIGH (ref 70–99)

## 2017-12-27 LAB — BASIC METABOLIC PANEL
ANION GAP: 10 (ref 5–15)
BUN: 66 mg/dL — ABNORMAL HIGH (ref 8–23)
CO2: 20 mmol/L — ABNORMAL LOW (ref 22–32)
Calcium: 8.6 mg/dL — ABNORMAL LOW (ref 8.9–10.3)
Chloride: 107 mmol/L (ref 98–111)
Creatinine, Ser: 3.55 mg/dL — ABNORMAL HIGH (ref 0.44–1.00)
GFR calc non Af Amer: 12 mL/min — ABNORMAL LOW (ref >60)
GFR, EST AFRICAN AMERICAN: 14 mL/min — AB (ref >60)
GLUCOSE: 134 mg/dL — AB (ref 70–99)
POTASSIUM: 4.8 mmol/L (ref 3.5–5.1)
Sodium: 137 mmol/L (ref 135–145)

## 2017-12-27 MED ORDER — LACTULOSE 10 GM/15ML PO SOLN
20.0000 g | Freq: Once | ORAL | Status: AC
  Administered 2017-12-27: 20 g via ORAL
  Filled 2017-12-27: qty 30

## 2017-12-27 MED ORDER — AMLODIPINE BESYLATE 5 MG PO TABS: 5.0000 mg | ORAL_TABLET | Freq: Every day | ORAL | 11 refills | Status: AC

## 2017-12-27 NOTE — Discharge Summary (Signed)
Physician Discharge Summary  Rebecca Benton JJO:[id number] DOB: Dec 07, 1950 DOA: 12/25/2017  PCP: Lin Landsman, MD  Admit date: 12/25/2017 Discharge date: 12/27/2017  Admitted From: Home Disposition: Home Recommendations for Outpatient Follow-up:  1. Follow up with PCP in 1-2 weeks 2. Please obtain BMP/CBC in one week  Home Health none Equipment/Devices: None  Discharge Condition stable CODE STATUS: Full code Diet recommendation: Cardiac Brief/Interim Summary:67 y.o.femalewith medical history significant ofcoronary artery disease status post CABG in 2016, COPD, diabetes, hypertension, right BKAin 2017, multiple other problems presenting with persistent shortness of breath over the past month.   Patient notes that she has been having trouble breathing on and off for the past month. She notes that it keeps coming back. She is notes a history of COPD for 3 to 4 years. She complains of cough as well as shortness of breath and wheezing. She denies any recent treatment with steroids. She notes that she is just been treated with breathing treatments. She is not on a controller medicine. Her symptoms have gotten progressively worse over the past few days. She notes that her daughter has had a cold recently. She notes chest discomfort with her cough.   ED Course:Labs, CXR, magnesium, duonebs. Received solumedrol and duonebs with EMS. Admit for COPD exacerbation.   Discharge Diagnoses:  Active Problems:   COPD exacerbation ([place])  COPD exacerbation patient initially admitted on steroids duo nebs and patient refused any further steroids so it was stopped.  Her concern was hyperglycemia which I explained to her is transient but she would not want to take any more steroids.  However she was kept on nebulizer treatments.  This morning she is better anxious to go home.  She is lives at home with her daughter.  CAD  S/p CABG:continue ASA, coreg, lipitor, imdur  CKD ZS:WFUXNATFTD  today 3.55 which is slightly higher than her baseline.  Patient will follow-up with nephrologist as an outpatient.   The time of admission her creatinine was 2.98.  Monitor renal functions avoid nephrotoxins.  Creatinine today is 3.48 which is a high jump from yesterday.   T2DM: last A1c 6.7, follow repeat. SSI. Not on any meds at home.  Hypertension: continue amlodipine, coreg, imdur  Right DUK:GURKYH  B12 deficiency:pt receives monthly injections (she notes she's no longer taking PO, though this was on her list and she her and daughter disagreed about this)  Continue gabapentin  Discharge Instructions  Discharge Instructions    Call MD for:  difficulty breathing, headache or visual disturbances   Complete by:  As directed    Call MD for:  persistant dizziness or light-headedness   Complete by:  As directed    Call MD for:  temperature >100.4   Complete by:  As directed    Diet - low sodium heart healthy   Complete by:  As directed    Increase activity slowly   Complete by:  As directed      Allergies as of 12/27/2017      Reactions   Penicillins    Has patient had a PCN reaction causing immediate rash, facial/tongue/throat swelling, SOB or lightheadedness with hypotension: YES Has patient had a PCN reaction causing severe rash involving mucus membranes or skin necrosis: NO Has patient had a PCN reaction that required hospitalization: YES Has patient had a PCN reaction occurring within the last 10 years: NO If all of the above answers are "NO", then may proceed with Cephalosporin use.   Tape Rash   Skin Irritation  Medication List    STOP taking these medications   PRESCRIPTION MEDICATION   valACYclovir 500 MG tablet Commonly known as:  VALTREX     TAKE these medications   albuterol 108 (90 Base) MCG/ACT inhaler Commonly known as:  PROVENTIL HFA;VENTOLIN HFA Inhale 2 puffs into the lungs every 6 (six) hours as needed for wheezing or shortness of  breath.   ALPRAZolam 1 MG tablet Commonly known as:  XANAX Take 1 mg by mouth 3 (three) times daily as needed for anxiety.   amLODipine 5 MG tablet Commonly known as:  NORVASC Take 1 tablet (5 mg total) by mouth daily. What changed:    medication strength  how much to take   aspirin EC 81 MG tablet Take 81 mg by mouth daily.   atorvastatin 40 MG tablet Commonly known as:  LIPITOR Take 40 mg by mouth at bedtime.   carvedilol 25 MG tablet Commonly known as:  COREG Take 25 mg by mouth 2 (two) times daily.   COLACE 100 MG capsule Generic drug:  docusate sodium Take 100 mg by mouth as needed (Constipation).   diphenhydrAMINE 25 MG tablet Commonly known as:  BENADRYL Take 12.5 mg by mouth every 6 (six) hours as needed for allergies.   DULoxetine 30 MG capsule Commonly known as:  CYMBALTA Take 30 mg by mouth daily.   gabapentin 300 MG capsule Commonly known as:  NEURONTIN Take 300 mg by mouth at bedtime.   isosorbide mononitrate 30 MG 24 hr tablet Commonly known as:  IMDUR Take 30 mg by mouth daily.   oxyCODONE-acetaminophen 10-325 MG tablet Commonly known as:  PERCOCET Take 1 tablet by mouth every 6 (six) hours as needed for pain.   VITAMIN B 12 PO Take 1,000 mg by mouth daily. Gummy tablet   cyanocobalamin 1000 MCG/ML injection Commonly known as:  (VITAMIN B-12) Inject 1 mL into the muscle every 30 (thirty) days.       Allergies  Allergen Reactions  . Penicillins     Has patient had a PCN reaction causing immediate rash, facial/tongue/throat swelling, SOB or lightheadedness with hypotension: YES Has patient had a PCN reaction causing severe rash involving mucus membranes or skin necrosis: NO Has patient had a PCN reaction that required hospitalization: YES Has patient had a PCN reaction occurring within the last 10 years: NO If all of the above answers are "NO", then may proceed with Cephalosporin use.  . Tape Rash    Skin Irritation     Consultations:none Procedures/Studies: Dg Chest 2 View  Result Date: 12/25/2017 CLINICAL DATA:  Onset severe shortness of breath, coughing and wheezing today. EXAM: CHEST - 2 VIEW COMPARISON:  PA and lateral chest 11/06/2017 and 04/29/2017. FINDINGS: There is some peribronchial thickening. No consolidative process, pneumothorax or effusion. Heart size is upper normal. The patient is status post CABG. No acute or focal abnormality. IMPRESSION: Bronchitic change without focal abnormality. Electronically Signed   By: Inge Rise M.D.   On: 12/25/2017 12:09    (Echo, Carotid, EGD, Colonoscopy, ERCP)    Subjective:   Discharge Exam: Vitals:   12/27/17 0501 12/27/17 1010  BP: 132/63 (!) 138/53  Pulse: 63 64  Resp: 18   Temp: 97.7 F (36.5 C)   SpO2: 96%    Vitals:   12/26/17 1416 12/26/17 2102 12/27/17 0501 12/27/17 1010  BP: 132/68 127/65 132/63 (!) 138/53  Pulse: 74 79 63 64  Resp:  18 18   Temp:  98.5 F (36.9 C)  97.7 F (36.5 C)   TempSrc:  Oral    SpO2: 94% 94% 96%   Weight:      Height:        General: Pt is alert, awake, not in acute distress Cardiovascular: RRR, S1/S2 +, no rubs, no gallops  Respiratory: CTA bilaterally, no wheezing, no rhonchi few scattered wheezes noted Abdominal: Soft, NT, ND, bowel sounds + Extremities: no edema, no cyanosis    The results of significant diagnostics from this hospitalization (including imaging, microbiology, ancillary and laboratory) are listed below for reference.     Microbiology: No results found for this or any previous visit (from the past 240 hour(s)).   Labs: BNP (last 3 results) No results for input(s): BNP in the last 8760 hours. Basic Metabolic Panel: Recent Labs  Lab 12/25/17 1047 12/26/17 0447 12/27/17 0527  NA 139 138 137  K 4.9 5.3* 4.8  CL 106 106 107  CO2 23 19* 20*  GLUCOSE 164* 228* 134*  BUN 40* 57* 66*  CREATININE 2.93* 3.48* 3.55*  CALCIUM 8.8* 9.0 8.6*   Liver Function  Tests: Recent Labs  Lab 12/25/17 1047 12/26/17 0447  AST 15 12*  ALT 10 10  ALKPHOS 77 73  BILITOT 0.3 0.4  PROT 6.9 6.3*  ALBUMIN 2.7* 2.5*   No results for input(s): LIPASE, AMYLASE in the last 168 hours. No results for input(s): AMMONIA in the last 168 hours. CBC: Recent Labs  Lab 12/25/17 1047 12/26/17 0447  WBC 5.4 2.7*  NEUTROABS 2.8  --   HGB 12.2 11.0*  HCT 40.9 37.3  MCV 90.9 90.8  PLT 235 242   Cardiac Enzymes: No results for input(s): CKTOTAL, CKMB, CKMBINDEX, TROPONINI in the last 168 hours. BNP: Invalid input(s): POCBNP CBG: Recent Labs  Lab 12/26/17 1415 12/26/17 1615 12/26/17 1904 12/26/17 2104 12/27/17 0743  GLUCAP 173* 185* 241* 224* 119*   D-Dimer No results for input(s): DDIMER in the last 72 hours. Hgb A1c Recent Labs    12/25/17 1050  HGBA1C 7.7*   Lipid Profile No results for input(s): CHOL, HDL, LDLCALC, TRIG, CHOLHDL, LDLDIRECT in the last 72 hours. Thyroid function studies No results for input(s): TSH, T4TOTAL, T3FREE, THYROIDAB in the last 72 hours.  Invalid input(s): FREET3 Anemia work up No results for input(s): VITAMINB12, FOLATE, FERRITIN, TIBC, IRON, RETICCTPCT in the last 72 hours. Urinalysis    Component Value Date/Time   COLORURINE YELLOW 04/30/2017 1338   APPEARANCEUR HAZY (A) 04/30/2017 1338   LABSPEC 1.006 04/30/2017 1338   PHURINE 6.0 04/30/2017 1338   GLUCOSEU NEGATIVE 04/30/2017 1338   HGBUR NEGATIVE 04/30/2017 1338   BILIRUBINUR NEGATIVE 04/30/2017 1338   KETONESUR NEGATIVE 04/30/2017 1338   PROTEINUR 100 (A) 04/30/2017 1338   NITRITE NEGATIVE 04/30/2017 1338   LEUKOCYTESUR NEGATIVE 04/30/2017 1338   Sepsis Labs Invalid input(s): PROCALCITONIN,  WBC,  LACTICIDVEN Microbiology No results found for this or any previous visit (from the past 240 hour(s)).   Time coordinating discharge:  78minutes  SIGNED:   Georgette Shell, MD  [facility] 12/27/2017, 10:27 AM Pager   If 7PM-7AM,  please contact night-coverage [email or web address] Password [email or web address]

## 2018-01-29 ENCOUNTER — Encounter: Payer: Self-pay | Admitting: Cardiovascular Disease

## 2018-02-18 ENCOUNTER — Ambulatory Visit: Payer: Medicare Other | Admitting: Cardiovascular Disease

## 2018-03-31 ENCOUNTER — Ambulatory Visit: Payer: Medicare Other | Admitting: Cardiovascular Disease

## 2018-04-01 ENCOUNTER — Encounter: Payer: Self-pay | Admitting: Cardiovascular Disease

## 2018-07-19 ENCOUNTER — Inpatient Hospital Stay (HOSPITAL_COMMUNITY)
Admission: EM | Admit: 2018-07-19 | Discharge: 2018-08-17 | DRG: 308 | Disposition: E | Payer: Medicare Other | Attending: Specialist | Admitting: Specialist

## 2018-07-19 ENCOUNTER — Encounter (HOSPITAL_COMMUNITY): Payer: Self-pay | Admitting: Cardiology

## 2018-07-19 ENCOUNTER — Other Ambulatory Visit: Payer: Self-pay

## 2018-07-19 ENCOUNTER — Other Ambulatory Visit: Payer: Self-pay | Admitting: Cardiology

## 2018-07-19 ENCOUNTER — Emergency Department (HOSPITAL_COMMUNITY): Payer: Medicare Other

## 2018-07-19 DIAGNOSIS — J9690 Respiratory failure, unspecified, unspecified whether with hypoxia or hypercapnia: Secondary | ICD-10-CM

## 2018-07-19 DIAGNOSIS — I2510 Atherosclerotic heart disease of native coronary artery without angina pectoris: Secondary | ICD-10-CM | POA: Diagnosis present

## 2018-07-19 DIAGNOSIS — G931 Anoxic brain damage, not elsewhere classified: Secondary | ICD-10-CM | POA: Diagnosis present

## 2018-07-19 DIAGNOSIS — E872 Acidosis, unspecified: Secondary | ICD-10-CM

## 2018-07-19 DIAGNOSIS — Z833 Family history of diabetes mellitus: Secondary | ICD-10-CM

## 2018-07-19 DIAGNOSIS — I469 Cardiac arrest, cause unspecified: Secondary | ICD-10-CM | POA: Diagnosis present

## 2018-07-19 DIAGNOSIS — R402112 Coma scale, eyes open, never, at arrival to emergency department: Secondary | ICD-10-CM | POA: Diagnosis present

## 2018-07-19 DIAGNOSIS — R402212 Coma scale, best verbal response, none, at arrival to emergency department: Secondary | ICD-10-CM | POA: Diagnosis present

## 2018-07-19 DIAGNOSIS — E1122 Type 2 diabetes mellitus with diabetic chronic kidney disease: Secondary | ICD-10-CM | POA: Diagnosis present

## 2018-07-19 DIAGNOSIS — Z01818 Encounter for other preprocedural examination: Secondary | ICD-10-CM

## 2018-07-19 DIAGNOSIS — N184 Chronic kidney disease, stage 4 (severe): Secondary | ICD-10-CM | POA: Diagnosis present

## 2018-07-19 DIAGNOSIS — R402312 Coma scale, best motor response, none, at arrival to emergency department: Secondary | ICD-10-CM | POA: Diagnosis present

## 2018-07-19 DIAGNOSIS — Z515 Encounter for palliative care: Secondary | ICD-10-CM

## 2018-07-19 DIAGNOSIS — E875 Hyperkalemia: Secondary | ICD-10-CM | POA: Diagnosis present

## 2018-07-19 DIAGNOSIS — Z9911 Dependence on respirator [ventilator] status: Secondary | ICD-10-CM

## 2018-07-19 DIAGNOSIS — I350 Nonrheumatic aortic (valve) stenosis: Secondary | ICD-10-CM | POA: Diagnosis present

## 2018-07-19 DIAGNOSIS — R570 Cardiogenic shock: Secondary | ICD-10-CM | POA: Diagnosis present

## 2018-07-19 DIAGNOSIS — I5021 Acute systolic (congestive) heart failure: Secondary | ICD-10-CM | POA: Diagnosis present

## 2018-07-19 DIAGNOSIS — K7200 Acute and subacute hepatic failure without coma: Secondary | ICD-10-CM | POA: Diagnosis present

## 2018-07-19 DIAGNOSIS — Z91048 Other nonmedicinal substance allergy status: Secondary | ICD-10-CM

## 2018-07-19 DIAGNOSIS — I63512 Cerebral infarction due to unspecified occlusion or stenosis of left middle cerebral artery: Secondary | ICD-10-CM | POA: Diagnosis not present

## 2018-07-19 DIAGNOSIS — E11649 Type 2 diabetes mellitus with hypoglycemia without coma: Secondary | ICD-10-CM | POA: Diagnosis not present

## 2018-07-19 DIAGNOSIS — E1165 Type 2 diabetes mellitus with hyperglycemia: Secondary | ICD-10-CM | POA: Diagnosis present

## 2018-07-19 DIAGNOSIS — Z880 Allergy status to penicillin: Secondary | ICD-10-CM

## 2018-07-19 DIAGNOSIS — N170 Acute kidney failure with tubular necrosis: Secondary | ICD-10-CM | POA: Diagnosis present

## 2018-07-19 DIAGNOSIS — J449 Chronic obstructive pulmonary disease, unspecified: Secondary | ICD-10-CM | POA: Diagnosis present

## 2018-07-19 DIAGNOSIS — J9601 Acute respiratory failure with hypoxia: Secondary | ICD-10-CM | POA: Diagnosis present

## 2018-07-19 DIAGNOSIS — G253 Myoclonus: Secondary | ICD-10-CM | POA: Diagnosis not present

## 2018-07-19 DIAGNOSIS — A419 Sepsis, unspecified organism: Secondary | ICD-10-CM | POA: Diagnosis not present

## 2018-07-19 DIAGNOSIS — Z7982 Long term (current) use of aspirin: Secondary | ICD-10-CM

## 2018-07-19 DIAGNOSIS — N179 Acute kidney failure, unspecified: Secondary | ICD-10-CM | POA: Diagnosis not present

## 2018-07-19 DIAGNOSIS — R680 Hypothermia, not associated with low environmental temperature: Secondary | ICD-10-CM | POA: Diagnosis not present

## 2018-07-19 DIAGNOSIS — J9602 Acute respiratory failure with hypercapnia: Secondary | ICD-10-CM | POA: Diagnosis present

## 2018-07-19 DIAGNOSIS — E1151 Type 2 diabetes mellitus with diabetic peripheral angiopathy without gangrene: Secondary | ICD-10-CM | POA: Diagnosis present

## 2018-07-19 DIAGNOSIS — G9340 Encephalopathy, unspecified: Secondary | ICD-10-CM | POA: Diagnosis not present

## 2018-07-19 DIAGNOSIS — Z8673 Personal history of transient ischemic attack (TIA), and cerebral infarction without residual deficits: Secondary | ICD-10-CM

## 2018-07-19 DIAGNOSIS — Z79899 Other long term (current) drug therapy: Secondary | ICD-10-CM

## 2018-07-19 DIAGNOSIS — Z20828 Contact with and (suspected) exposure to other viral communicable diseases: Secondary | ICD-10-CM | POA: Diagnosis present

## 2018-07-19 DIAGNOSIS — Z8249 Family history of ischemic heart disease and other diseases of the circulatory system: Secondary | ICD-10-CM

## 2018-07-19 DIAGNOSIS — Z452 Encounter for adjustment and management of vascular access device: Secondary | ICD-10-CM

## 2018-07-19 DIAGNOSIS — Z87891 Personal history of nicotine dependence: Secondary | ICD-10-CM

## 2018-07-19 DIAGNOSIS — I4901 Ventricular fibrillation: Secondary | ICD-10-CM | POA: Diagnosis present

## 2018-07-19 DIAGNOSIS — I130 Hypertensive heart and chronic kidney disease with heart failure and stage 1 through stage 4 chronic kidney disease, or unspecified chronic kidney disease: Secondary | ICD-10-CM | POA: Diagnosis present

## 2018-07-19 DIAGNOSIS — R34 Anuria and oliguria: Secondary | ICD-10-CM | POA: Diagnosis not present

## 2018-07-19 DIAGNOSIS — R6521 Severe sepsis with septic shock: Secondary | ICD-10-CM | POA: Diagnosis not present

## 2018-07-19 DIAGNOSIS — Z7902 Long term (current) use of antithrombotics/antiplatelets: Secondary | ICD-10-CM

## 2018-07-19 DIAGNOSIS — Z7189 Other specified counseling: Secondary | ICD-10-CM

## 2018-07-19 DIAGNOSIS — Z89511 Acquired absence of right leg below knee: Secondary | ICD-10-CM

## 2018-07-19 DIAGNOSIS — Z951 Presence of aortocoronary bypass graft: Secondary | ICD-10-CM

## 2018-07-19 HISTORY — DX: Cerebral infarction, unspecified: I63.9

## 2018-07-19 HISTORY — DX: Chronic obstructive pulmonary disease, unspecified: J44.9

## 2018-07-19 HISTORY — DX: Chronic kidney disease, stage 4 (severe): N18.4

## 2018-07-19 HISTORY — DX: Presence of aortocoronary bypass graft: Z95.1

## 2018-07-19 HISTORY — DX: Essential (primary) hypertension: I10

## 2018-07-19 HISTORY — DX: Type 2 diabetes mellitus without complications: E11.9

## 2018-07-19 LAB — CBC WITH DIFFERENTIAL/PLATELET
Abs Immature Granulocytes: 0.33 10*3/uL — ABNORMAL HIGH (ref 0.00–0.07)
Basophils Absolute: 0.1 10*3/uL (ref 0.0–0.1)
Basophils Relative: 0 %
Eosinophils Absolute: 1.8 10*3/uL — ABNORMAL HIGH (ref 0.0–0.5)
Eosinophils Relative: 11 %
HCT: 38.5 % (ref 36.0–46.0)
Hemoglobin: 11.2 g/dL — ABNORMAL LOW (ref 12.0–15.0)
Immature Granulocytes: 2 %
Lymphocytes Relative: 23 %
Lymphs Abs: 3.7 10*3/uL (ref 0.7–4.0)
MCH: 27.9 pg (ref 26.0–34.0)
MCHC: 29.1 g/dL — ABNORMAL LOW (ref 30.0–36.0)
MCV: 96 fL (ref 80.0–100.0)
Monocytes Absolute: 1 10*3/uL (ref 0.1–1.0)
Monocytes Relative: 6 %
Neutro Abs: 9.4 10*3/uL — ABNORMAL HIGH (ref 1.7–7.7)
Neutrophils Relative %: 58 %
Platelets: 196 10*3/uL (ref 150–400)
RBC: 4.01 MIL/uL (ref 3.87–5.11)
RDW: 14 % (ref 11.5–15.5)
WBC: 16.3 10*3/uL — ABNORMAL HIGH (ref 4.0–10.5)
nRBC: 0.1 % (ref 0.0–0.2)

## 2018-07-19 LAB — COMPREHENSIVE METABOLIC PANEL
ALT: 82 U/L — ABNORMAL HIGH (ref 0–44)
AST: 102 U/L — ABNORMAL HIGH (ref 15–41)
Albumin: 2.8 g/dL — ABNORMAL LOW (ref 3.5–5.0)
Alkaline Phosphatase: 100 U/L (ref 38–126)
Anion gap: 17 — ABNORMAL HIGH (ref 5–15)
BUN: 51 mg/dL — ABNORMAL HIGH (ref 8–23)
CO2: 14 mmol/L — ABNORMAL LOW (ref 22–32)
Calcium: 8.5 mg/dL — ABNORMAL LOW (ref 8.9–10.3)
Chloride: 105 mmol/L (ref 98–111)
Creatinine, Ser: 4.41 mg/dL — ABNORMAL HIGH (ref 0.44–1.00)
GFR calc Af Amer: 11 mL/min — ABNORMAL LOW (ref >60)
GFR calc non Af Amer: 10 mL/min — ABNORMAL LOW (ref >60)
Glucose, Bld: 262 mg/dL — ABNORMAL HIGH (ref 70–99)
Potassium: 5.8 mmol/L — ABNORMAL HIGH (ref 3.5–5.1)
Sodium: 136 mmol/L (ref 135–145)
Total Bilirubin: 0.6 mg/dL (ref 0.3–1.2)
Total Protein: 6 g/dL — ABNORMAL LOW (ref 6.5–8.1)

## 2018-07-19 LAB — POCT I-STAT EG7
Acid-base deficit: 10 mmol/L — ABNORMAL HIGH (ref 0.0–2.0)
Bicarbonate: 18.8 mmol/L — ABNORMAL LOW (ref 20.0–28.0)
Calcium, Ion: 1.07 mmol/L — ABNORMAL LOW (ref 1.15–1.40)
HCT: 35 % — ABNORMAL LOW (ref 36.0–46.0)
Hemoglobin: 11.9 g/dL — ABNORMAL LOW (ref 12.0–15.0)
O2 Saturation: 95 %
Potassium: 5.7 mmol/L — ABNORMAL HIGH (ref 3.5–5.1)
Sodium: 135 mmol/L (ref 135–145)
TCO2: 20 mmol/L — ABNORMAL LOW (ref 22–32)
pCO2, Ven: 52.1 mmHg (ref 44.0–60.0)
pH, Ven: 7.166 — CL (ref 7.250–7.430)
pO2, Ven: 99 mmHg — ABNORMAL HIGH (ref 32.0–45.0)

## 2018-07-19 LAB — POCT I-STAT 7, (LYTES, BLD GAS, ICA,H+H)
Acid-base deficit: 10 mmol/L — ABNORMAL HIGH (ref 0.0–2.0)
Bicarbonate: 16.8 mmol/L — ABNORMAL LOW (ref 20.0–28.0)
Calcium, Ion: 1.18 mmol/L (ref 1.15–1.40)
HCT: 34 % — ABNORMAL LOW (ref 36.0–46.0)
Hemoglobin: 11.6 g/dL — ABNORMAL LOW (ref 12.0–15.0)
O2 Saturation: 100 %
Patient temperature: 98.6
Potassium: 5.3 mmol/L — ABNORMAL HIGH (ref 3.5–5.1)
Sodium: 136 mmol/L (ref 135–145)
TCO2: 18 mmol/L — ABNORMAL LOW (ref 22–32)
pCO2 arterial: 40.8 mmHg (ref 32.0–48.0)
pH, Arterial: 7.222 — ABNORMAL LOW (ref 7.350–7.450)
pO2, Arterial: 319 mmHg — ABNORMAL HIGH (ref 83.0–108.0)

## 2018-07-19 LAB — SARS CORONAVIRUS 2 BY RT PCR (HOSPITAL ORDER, PERFORMED IN ~~LOC~~ HOSPITAL LAB): SARS Coronavirus 2: NEGATIVE

## 2018-07-19 LAB — TROPONIN I: Troponin I: 0.03 ng/mL (ref <0.03)

## 2018-07-19 LAB — ETHANOL: Alcohol, Ethyl (B): <10 mg/dL (ref <10)

## 2018-07-19 LAB — I-STAT CREATININE, ED: Creatinine, Ser: 4.3 mg/dL — ABNORMAL HIGH (ref 0.44–1.00)

## 2018-07-19 LAB — CBG MONITORING, ED: Glucose-Capillary: 193 mg/dL — ABNORMAL HIGH (ref 70–99)

## 2018-07-19 LAB — LACTIC ACID, PLASMA: Lactic Acid, Venous: 5.6 mmol/L (ref 0.5–1.9)

## 2018-07-19 LAB — PROTIME-INR
INR: 1.2 (ref 0.8–1.2)
Prothrombin Time: 15.1 seconds (ref 11.4–15.2)

## 2018-07-19 LAB — TYPE AND SCREEN
ABO/RH(D): A POS
Antibody Screen: NEGATIVE

## 2018-07-19 MED ORDER — PANTOPRAZOLE SODIUM 40 MG IV SOLR
40.0000 mg | Freq: Every day | INTRAVENOUS | Status: DC
  Administered 2018-07-20 – 2018-07-23 (×4): 40 mg via INTRAVENOUS
  Filled 2018-07-19 (×5): qty 40

## 2018-07-19 MED ORDER — HEPARIN SODIUM (PORCINE) 5000 UNIT/ML IJ SOLN
INTRAMUSCULAR | Status: AC
  Filled 2018-07-19: qty 1

## 2018-07-19 MED ORDER — ORAL CARE MOUTH RINSE
15.0000 mL | OROMUCOSAL | Status: DC
  Administered 2018-07-20 – 2018-07-24 (×42): 15 mL via OROMUCOSAL

## 2018-07-19 MED ORDER — CALCIUM CHLORIDE 10 % IV SOLN
INTRAVENOUS | Status: AC
  Filled 2018-07-19: qty 10

## 2018-07-19 MED ORDER — ASPIRIN 300 MG RE SUPP
300.0000 mg | Freq: Once | RECTAL | Status: DC
  Filled 2018-07-19: qty 1

## 2018-07-19 MED ORDER — NOREPINEPHRINE 4 MG/250ML-% IV SOLN
INTRAVENOUS | Status: AC
  Filled 2018-07-19: qty 250

## 2018-07-19 MED ORDER — SODIUM ZIRCONIUM CYCLOSILICATE 10 G PO PACK
10.0000 g | PACK | Freq: Once | ORAL | Status: DC
  Filled 2018-07-19: qty 1

## 2018-07-19 MED ORDER — AMIODARONE HCL IN DEXTROSE 360-4.14 MG/200ML-% IV SOLN
30.0000 mg/h | INTRAVENOUS | Status: DC
  Administered 2018-07-20 – 2018-07-23 (×8): 30 mg/h via INTRAVENOUS
  Filled 2018-07-19 (×8): qty 200

## 2018-07-19 MED ORDER — FENTANYL CITRATE (PF) 100 MCG/2ML IJ SOLN: 50.0000 ug | Freq: Once | INTRAMUSCULAR | Status: DC

## 2018-07-19 MED ORDER — AMIODARONE LOAD VIA INFUSION
150.0000 mg | Freq: Once | INTRAVENOUS | Status: DC
  Filled 2018-07-19: qty 83.34

## 2018-07-19 MED ORDER — AMIODARONE HCL IN DEXTROSE 360-4.14 MG/200ML-% IV SOLN
60.0000 mg/h | INTRAVENOUS | Status: AC
  Administered 2018-07-19: 60 mg/h via INTRAVENOUS
  Filled 2018-07-19 (×3): qty 200

## 2018-07-19 MED ORDER — ROCURONIUM BROMIDE 50 MG/5ML IV SOLN
80.0000 mg | Freq: Once | INTRAVENOUS | Status: AC
  Administered 2018-07-19: 80 mg via INTRAVENOUS
  Filled 2018-07-19: qty 8

## 2018-07-19 MED ORDER — EPINEPHRINE PF 1 MG/ML IJ SOLN
0.5000 ug/min | INTRAVENOUS | Status: DC
  Administered 2018-07-19: 9 ug/min via INTRAVENOUS
  Administered 2018-07-20 (×2): 15 ug/min via INTRAVENOUS
  Administered 2018-07-20: 8 ug/min via INTRAVENOUS
  Administered 2018-07-20: 10 ug/min via INTRAVENOUS
  Administered 2018-07-21: 9 ug/min via INTRAVENOUS
  Administered 2018-07-21: 5 ug/min via INTRAVENOUS
  Administered 2018-07-22: 2 ug/min via INTRAVENOUS
  Administered 2018-07-23 (×2): 20 ug/min via INTRAVENOUS
  Administered 2018-07-23: 18 ug/min via INTRAVENOUS
  Administered 2018-07-23: 15 ug/min via INTRAVENOUS
  Administered 2018-07-23: 17 ug/min via INTRAVENOUS
  Administered 2018-07-24 (×2): 20 ug/min via INTRAVENOUS
  Filled 2018-07-19 (×19): qty 4

## 2018-07-19 MED ORDER — SODIUM BICARBONATE 8.4 % IV SOLN
INTRAVENOUS | Status: DC
  Administered 2018-07-19 – 2018-07-20 (×2): via INTRAVENOUS
  Filled 2018-07-19 (×3): qty 150

## 2018-07-19 MED ORDER — CHLORHEXIDINE GLUCONATE 0.12% ORAL RINSE (MEDLINE KIT)
15.0000 mL | Freq: Two times a day (BID) | OROMUCOSAL | Status: DC
  Administered 2018-07-20 – 2018-07-24 (×10): 15 mL via OROMUCOSAL

## 2018-07-19 MED ORDER — AMIODARONE HCL IN DEXTROSE 360-4.14 MG/200ML-% IV SOLN
INTRAVENOUS | Status: AC
  Filled 2018-07-19: qty 200

## 2018-07-19 MED ORDER — ETOMIDATE 2 MG/ML IV SOLN
20.0000 mg | Freq: Once | INTRAVENOUS | Status: AC
  Administered 2018-07-19: 20 mg via INTRAVENOUS

## 2018-07-19 MED ORDER — HEPARIN SODIUM (PORCINE) 1000 UNIT/ML IJ SOLN
4000.0000 [IU] | Freq: Once | INTRAMUSCULAR | Status: DC
  Filled 2018-07-19: qty 4

## 2018-07-19 MED ORDER — INSULIN ASPART 100 UNIT/ML ~~LOC~~ SOLN
0.0000 [IU] | SUBCUTANEOUS | Status: DC
  Administered 2018-07-20 (×2): 9 [IU] via SUBCUTANEOUS

## 2018-07-19 MED ORDER — NOREPINEPHRINE 4 MG/250ML-% IV SOLN
0.0000 ug/min | INTRAVENOUS | Status: DC
  Administered 2018-07-19: 10 ug/min via INTRAVENOUS
  Filled 2018-07-19: qty 250

## 2018-07-19 NOTE — H&P (Addendum)
NAME:  Rebecca Benton, MRN:  [id number], DOB:  30-May-1950, LOS: 0 ADMISSION DATE:  08/12/2018, CONSULTATION DATE:  08/08/2018 REFERRING MD:  Dr. Regenia Skeeter, CHIEF COMPLAINT:  Cardiac arrest   Brief History   68 year old female presenting from home with witnessed prolonged cardiac arrest.  Likely primary cardiac event, however given her poor neurological function, she is not a candidate for intervention. Remains on multiple pressors.   History of present illness   HPI obtained from medical chart and from daughter at bedside as patient is intubated on mechanical ventilation with acute encephalopathy.   See additional chart MRN [id number]  68 year old female with past history of COPD, CKD stage 4, DM, CAD s/p CABG 2016, HTN, stroke, and R BKA 2017 presenting from home with cardiac arrest.    Daughter reports mother had some issues with hypertension earlier this week.  Today, was in her normal state of health, cooking, when she sat on the bed holding her chest, daughter went to call EMS, and then found unresponsive on the floor.  Daughter started CPR but states it was prolonged time prior to first responder arrival.  Initial rhythm PEA, with ROSC obtained after 16 minutes with ACLS.  On arrival to ER, she was profoundly bradycardic.  She unfortunately suffered second cardiac arrest with initial PEA rhythm progressing to VT requiring defib x 4 with ACLS measures with ROSC obtained after 21 minutes.  During, her EKG showed intermittent anterolateral ST elevation.  Cardiology was called to bedside.  Since, she has been hypothermic, unresponsive with absent reflexes and fixed/dilated pupils, requiring epi and levophed for heart rate and blood pressure support, and bicarb infusion for acidosis. She remains on amiodarone gtt.  Given her grim neurological status, further cardiac interventional workup was deferred with recommendations for medical management at this time. CT head is pending prior to starting heparin drip.   Lab work noted for significant AKI, mild Transaminitis, WBC 16, lactic 5.6, normal coags. CXR non acute.  PCCM called for admit.   Past Medical History  COPD, CKD stage 4, DM, CAD s/p CABG 2016, HTN, stroke, R BKA 2017  Significant Hospital Events   5/3 Admit/ cardiac arrest   Consults:  Cardiology  Procedures:  5/3 ETT >>  Significant Diagnostic Tests:  5/4 [facility] >>  Micro Data:  5/3 BCx 2 >>  5/3 UC >> 5/3 trach asp >> 5/3 COVID >> neg  Antimicrobials:   Interim history/subjective:  Weaning epi and levophed drips, current at epi 8 mcg/min, levophed 7 mcg/min  Objective   Pulse (!) 51, temperature (!) 95.6 F (35.3 C), temperature source Tympanic, resp. rate 14, weight 56.7 kg, SpO2 100 %.    Vent Mode: PRVC FiO2 (%):  [100 %] 100 % Set Rate:  [14 bmp] 14 bmp Vt Set:  [500 mL] 500 mL PEEP:  [10 cmH20] 10 cmH20 Plateau Pressure:  [20 cmH20] 20 cmH20  No intake or output data in the 24 hours ending 07/28/2018 2325 Filed Weights   07/30/2018 2057  Weight: 56.7 kg   Examination: General:  Critically ill female unresponsive on MV HEENT: MM pink/moist, ETT/ OGT, pupils fixed/ dilated/ non reactive, absent corneal reflexes, scleral edema R>L Neuro: unresponsive CV: RR, SB- 40's PULM: even/non-labored, lungs bilaterally coarse, scattered wheeze XT:GGYI, non-distended, hypobs Extremities: cool/dry, no LE edema, R BKA, left tibial I/O Skin: no rashes   Resolved Hospital Problem list    Assessment & Plan:  Cardiac arrest- initial prolonged PEA/ then VT Shock-  likely cardiogenic  - likely cardiac etiology given hx, presentation, and EKG findings - unfortunately, due to her poor neurologic exam and prolonged arrest is not a candidate for interventional workup  P:  Tele monitoring Cards following Will need CVL and art line placement Continue to wean epi and levophed as able for goal MAP > 65 Trend lactate Consider coox Trend troponin (expected to rise, will not change  management at this time) and EKG Will start heparin gtt (once CTH is completed) Daily ASA  VT P:  Per cards Continue amio Goal K>4, Mag >2  Acute respiratory failure COPD P:  Full MV support, PRVC 8 cc/kg, rate 16 CXR reviewed- clear, good positioning of ETT/ OGT Repeat ABG after art line Trend CXR VAP bundle Duonebs q 6 prn   Acute encephalopathy- concerning for severe anoxic injury+/- toxic/ metabolic  P:  will defer TTM given prolonged arrest x 2 (cumulative 37 mins, not including downtime prior to EMS) Correct metabolic disturbances/ normothermia CTH pending EEG in am  Trending neuro exams, currently has absent reflexes, apneic, and fixed/dilated pupils, may consider brain death testing   AKI- previous sCr 3.55 Hyperkalemia  AGMA/ lactic acid  P:  Continue foley  S/p lokemia  Continue bicarb gtt for now Recheck BMP at 0400 Trend BMP / urinary output Replace electrolytes as indicated Avoid nephrotoxic agents, ensure adequate renal perfusion  Transaminitis - likely shock liver P:  Trend LFTs/ coags   DM P:  SSI sensitive CBG q 4  GLOBAL: I am concerned given her chronic comorbid conditions and now her acute shock after prolonged cardiac arrest with probable likely cardiac etiology that we can not evaluate at this time with her poor mental status with absent brainstem reflexes, and evidence of MODS, her prognosis is grim and I do not expect her to survive this.  I have had extensive discussions with the daughter about her condition with strong recommendation to not perform CPR again as we have not much else to offer at this point given what she is already on.  However, she says her mother has told her she would want everything done and still wants CPR to honor her mothers wishes.    Best practice:  Diet: NPO Pain/Anxiety/Delirium protocol (if indicated): not needed at this time VAP protocol (if indicated): yes DVT prophylaxis: heparin gtt GI prophylaxis:  PPI Glucose control: SSI  Mobility: BR Code Status: Full  Family Communication: Daughter Jolanda [phone]), 2nd contact/family Modena Slater [phone].  Jolanda ask that no information be given out other than these 2 listed.  Disposition: ICU  Labs   CBC: Recent Labs  Lab 08/16/2018 2131 08/06/2018 2135 08/16/2018 2144  WBC 16.3*  --   --   NEUTROABS 9.4*  --   --   HGB 11.2* 11.6* 11.9*  HCT 38.5 34.0* 35.0*  MCV 96.0  --   --   PLT 196  --   --     Basic Metabolic Panel: Recent Labs  Lab 07/27/2018 2135 08/03/2018 2144  NA 136 135  K 5.3* 5.7*  CREATININE  --  4.30*   GFR: CrCl cannot be calculated (Unknown ideal weight.). Recent Labs  Lab 08/02/2018 2131 07/25/2018 2248  WBC 16.3*  --   LATICACIDVEN  --  5.6*    Liver Function Tests: No results for input(s): AST, ALT, ALKPHOS, BILITOT, PROT, ALBUMIN in the last 168 hours. No results for input(s): LIPASE, AMYLASE in the last 168 hours. No results for input(s): AMMONIA in the  last 168 hours.  ABG    Component Value Date/Time   PHART 7.222 (L) 08/13/2018 2135   PCO2ART 40.8 08/11/2018 2135   PO2ART 319.0 (H) 07/21/2018 2135   HCO3 18.8 (L) 07/23/2018 2144   TCO2 20 (L) 08/10/2018 2144   ACIDBASEDEF 10.0 (H) 07/28/2018 2144   O2SAT 95.0 07/23/2018 2144     Coagulation Profile: Recent Labs  Lab 08/15/2018 2131  INR 1.2    Cardiac Enzymes: No results for input(s): CKTOTAL, CKMB, CKMBINDEX, TROPONINI in the last 168 hours.  HbA1C: No results found for: HGBA1C  CBG: Recent Labs  Lab 07/27/2018 2139  GLUCAP 193*    Review of Systems:   unable  Past Medical History  She,  has a past medical history of CKD (chronic kidney disease) stage 4, GFR 15-29 ml/min (HCC), COPD (chronic obstructive pulmonary disease) ([place]), Diabetes ([place]), CABG, Hypertension, and Stroke ([place]).   Surgical History    Past Surgical History:  Procedure Laterality Date   BELOW KNEE LEG AMPUTATION     CORONARY ARTERY BYPASS GRAFT      TUBAL LIGATION       Social History   reports that she has quit smoking. She has never used smokeless tobacco. She reports previous alcohol use. She reports previous drug use.   Family History   Her family history includes Diabetes in her father and mother; Heart disease in her father and mother.   Allergies Allergies  Allergen Reactions   Penicillins    Tape      Home Medications  Prior to Admission medications   Not on File     Critical care time: 90 mins (including time spent in extensive conversation with daughter)    Kennieth Rad, MSN, AGACNP-BC [facility] Pgr: [phone] or if no answer [phone] 07/20/2018, 1:47 AM   Patient seen in concert with NP Simpson.  Patient critically ill and appears to have suffered severe neurologic damage during prolonged resuscitation.  Prognosis is extremely poor and I do not anticipate meaningful recover.  Agree with assessment and plan.

## 2018-07-19 NOTE — ED Triage Notes (Signed)
Pt transported from home by EMS after witnessed arrest at home, per EMS pt had "felt fine" all day, cooking with family. Per daughter pt sat on bed then fell into floor holding her chest.  Daughter started CPR @ 2018, on arrival fire found pt to be in PEA, Epi x 2, ROSC @ 2034. I/O LLE, #18 L AC. King airway in placed.

## 2018-07-19 NOTE — ED Notes (Signed)
CCM PA at bedside 

## 2018-07-19 NOTE — Consult Note (Signed)
CARDIOLOGY CONSULT NOTE  Patient ID: Rebecca Benton MRN: [id number] DOB/AGE: 69-26-1952 69 y.o.  Admit date: 07/23/2018 Referring Physician:  [facility] Primary Physician:  Lin Landsman, MD Reason for Consultation: Cardiac arrest  HPI:   68 y.o.femalewith medical history significant ofcoronary artery disease status post CABG in 2016, COPD, diabetes, hypertension, right BKAin 2017, (history obtained from chart review), currently in [facility] ED with cardiac arrest.  Patient reportedly had out of hospital cardiac arrest at 8:18 PM, with initial rhythm being PEA arrest requiring 10 min long CPR by EMS with ROSC. While in the ED, patient had another cardiac arrest, this time Vfib. She is currently intubated, not sedated, on two pressors, with no neurological activity. EKG on arrival prior to cardiac arrest at 2103 hrs shows sinus rhythm with IVCD and ST elevation. Subsequently, EKG after cardiac arrest at 2147 hrs shows acute inferolateral ST elevation. Subsequent EKG at 2222 hrs shows sinus rhythm with intermittent sinus pause and junctional escape rhythm, without any ST segment elevation. I received the call at 2153 hrs.   Family is en route.   Past Medical History:  Diagnosis Date  . CKD (chronic kidney disease) stage 4, GFR 15-29 ml/min (HCC)   . COPD (chronic obstructive pulmonary disease) ([place])   . Diabetes ([place])   . Hx of CABG   . Hypertension   . Stroke [facility])      Past Surgical History:  Procedure Laterality Date  . BELOW KNEE LEG AMPUTATION    . CORONARY ARTERY BYPASS GRAFT    . TUBAL LIGATION       Family History  Problem Relation Age of Onset  . Heart disease Mother   . Diabetes Mother   . Heart disease Father   . Diabetes Father      Social History: Social History   Socioeconomic History  . Marital status: Widowed    Spouse name: Not on file  . Number of children: Not on file  . Years of education: Not on file  . Highest education level: Not on file   Occupational History  . Not on file  Social Needs  . Financial resource strain: Not on file  . Food insecurity:    Worry: Not on file    Inability: Not on file  . Transportation needs:    Medical: Not on file    Non-medical: Not on file  Tobacco Use  . Smoking status: Not on file  Substance and Sexual Activity  . Alcohol use: Not on file  . Drug use: Not on file  . Sexual activity: Not on file  Lifestyle  . Physical activity:    Days per week: Not on file    Minutes per session: Not on file  . Stress: Not on file  Relationships  . Social connections:    Talks on phone: Not on file    Gets together: Not on file    Attends religious service: Not on file    Active member of club or organization: Not on file    Attends meetings of clubs or organizations: Not on file    Relationship status: Not on file  . Intimate partner violence:    Fear of current or ex partner: Not on file    Emotionally abused: Not on file    Physically abused: Not on file    Forced sexual activity: Not on file  Other Topics Concern  . Not on file  Social History Narrative  . Not on file  Review of Systems  Unable to perform ROS: Patient unresponsive      Physical Exam: Physical Exam  Constitutional: She appears well-developed. No distress.  HENT:  Head: Normocephalic and atraumatic.  Eyes:  Pupils dilated and fixed  Neck: No JVD present.  Cardiovascular: Bradycardia present.  No distant pulses appreciated  Pulmonary/Chest: She has no wheezes. She has no rales.  Intubated  Abdominal: There is no rebound.  Musculoskeletal:        General: No edema.  Lymphadenopathy:    She has no cervical adenopathy.  Neurological:  Intubated, unresponsive  Skin:  Cool extremities. Rt BKA  Psychiatric:  Not assessed  Nursing note and vitals reviewed.    Labs:   Lab Results  Component Value Date   WBC 16.3 (H) 07/23/2018   HGB 11.9 (L) 07/31/2018   HCT 35.0 (L) 07/30/2018   MCV 96.0  08/11/2018   PLT 196 08/01/2018    Recent Labs  Lab 08/08/2018 2144  NA 135  K 5.7*  CREATININE 4.30*      Radiology: Dg Chest Portable 1 View  Result Date: 08/10/2018 CLINICAL DATA:  68 y/o F; patient coded, respiratory distress, intubation. EXAM: PORTABLE CHEST 1 VIEW COMPARISON:  None. FINDINGS: Normal cardiac silhouette given projection and technique. Post median sternotomy with wires intact. Endotracheal tube tip 3.4 cm above the carina. Enteric tube tip extends below the field of view into the abdomen. Aortic atherosclerosis with calcification. Clear lungs. No pleural effusion or pneumothorax. No acute osseous abnormality is evident. IMPRESSION: 1. Endotracheal tube tip 3.4 cm above the carina. Enteric tube tip extends below the field of view into the abdomen. 2. No acute pulmonary process identified. Electronically Signed   By: Kristine Garbe M.D.   On: 07/18/2018 22:25    Scheduled Meds: . amiodarone  150 mg Intravenous Once  . aspirin  300 mg Rectal Once  . calcium chloride      . etomidate  20 mg Intravenous Once  . fentaNYL (SUBLIMAZE) injection  50 mcg Intravenous Once  . heparin      . heparin  4,000 Units Intravenous Once  . rocuronium  80 mg Intravenous Once   Continuous Infusions: . amiodarone     Followed by  . [START ON 07/20/2018] amiodarone    . amiodarone    . epinephrine    . norepinephrine    . norepinephrine (LEVOPHED) Adult infusion    .  sodium bicarbonate  infusion 1000 mL     PRN Meds:.  CARDIAC STUDIES:  EKG on arrival prior to cardiac arrest at 2103 hrs shows sinus rhythm with IVCD and ST elevation. Subsequently, EKG after cardiac arrest at 2147 hrs shows acute inferolateral ST elevation. Subsequent EKG at 2222 hrs shows sinus rhythm with intermittent sinus pause and junctional escape rhythm, without any ST segment elevation.   Assessment & Recommendations:  68 y.o.femalewith medical history significant ofcoronary artery disease  status post CABG in 2016, COPD, diabetes, hypertension, right BKAin 2017, (history obtained from chart review), currently in [facility] ED with cardiac arrest.  Cardiac arrest: Out of hospital PEA arrest, followed by in hospital Vfib arrest. Intermittent ST elevations, currently resolved.  No neurological activity. Pupils dilated and fixed. PH 7.16. Currently in circulatory shock, requiring two pressors. Prolonged cardiac arrest with poor prognosis, in addition to multiple baseline comorbidities. I do not think risks of invasive coronary angiography outweighs minimal benefits. Strongly recommend goals of care discussion with the family and consideration for no escalation of  care.   Nigel Mormon, MD 07/22/2018, 10:53 PM [facility] Pager: [phone] Office: [phone] If no answer Cell [phone]

## 2018-07-19 NOTE — ED Provider Notes (Signed)
[facility] EMERGENCY DEPARTMENT Provider Note   CSN: [id number] Arrival date & time: 07/20/2018  2058   LEVEL 5 CAVEAT - UNRESPONSIVE  History   Chief Complaint Chief Complaint  Patient presents with  . Post CPR    HPI Rebecca Benton is a 68 y.o. female.     HPI  68 year old female presents after cardiac arrest.  History is taken from EMS.  The patient was at home when she all of a sudden states she did not feel well and her hands were on her chest and then she went unresponsive.  Daughter started CPR.  Patient according to EMS was previously well.  She has chronic kidney disease, diabetes and COPD.  EMS performed CPR and gave 2 epinephrines and now she is back in a bradycardic rhythm but with a good blood pressure.  She has a Cytogeneticist airway airway in.  Further history is pretty limited.  Past Medical History:  Diagnosis Date  . CKD (chronic kidney disease) stage 4, GFR 15-29 ml/min (HCC)   . COPD (chronic obstructive pulmonary disease) ([place])   . Diabetes ([place])   . Hx of CABG   . Hypertension   . Stroke [facility])     Patient Active Problem List   Diagnosis Date Noted  . Cardiac arrest ([place]) 08/03/2018       OB History   No obstetric history on file.      Home Medications    Prior to Admission medications   Not on File    Family History Family History  Problem Relation Age of Onset  . Heart disease Mother   . Diabetes Mother   . Heart disease Father   . Diabetes Father     Social History Social History   Tobacco Use  . Smoking status: Former Research scientist (life sciences)  . Smokeless tobacco: Never Used  Substance Use Topics  . Alcohol use: Not Currently  . Drug use: Not Currently     Allergies   Penicillins and Tape   Review of Systems Review of Systems  Unable to perform ROS: Patient unresponsive     Physical Exam Updated Vital Signs BP (!) 145/74   Pulse (!) 40   Temp (!) 95.1 F (35.1 C)   Resp 16   Wt 56.7 kg   SpO2 100%   Physical Exam  Vitals signs and nursing note reviewed.  Constitutional:      Appearance: She is well-developed.  HENT:     Head: Normocephalic and atraumatic.     Right Ear: External ear normal.     Left Ear: External ear normal.     Nose: Nose normal.  Eyes:     General:        Right eye: No discharge.        Left eye: No discharge.     Comments: Pupils are mid-sized and reactive  Cardiovascular:     Rate and Rhythm: Regular rhythm. Bradycardia present.  Pulmonary:     Effort: Pulmonary effort is normal.  Abdominal:     General: There is no distension.     Palpations: Abdomen is soft.  Musculoskeletal:     Comments: Right bka  Skin:    General: Skin is warm and dry.  Neurological:     Mental Status: She is unresponsive.     GCS: GCS eye subscore is 1. GCS verbal subscore is 1. GCS motor subscore is 1.  Psychiatric:        Mood and Affect: Mood  is not anxious.      ED Treatments / Results  Labs (all labs ordered are listed, but only abnormal results are displayed) Labs Reviewed  COMPREHENSIVE METABOLIC PANEL - Abnormal; Notable for the following components:      Result Value   Potassium 5.8 (*)    CO2 14 (*)    Glucose, Bld 262 (*)    BUN 51 (*)    Creatinine, Ser 4.41 (*)    Calcium 8.5 (*)    Total Protein 6.0 (*)    Albumin 2.8 (*)    AST 102 (*)    ALT 82 (*)    GFR calc non Af Amer 10 (*)    GFR calc Af Amer 11 (*)    Anion gap 17 (*)    All other components within normal limits  TROPONIN I - Abnormal; Notable for the following components:   Troponin I 0.03 (*)    All other components within normal limits  CBC WITH DIFFERENTIAL/PLATELET - Abnormal; Notable for the following components:   WBC 16.3 (*)    Hemoglobin 11.2 (*)    MCHC 29.1 (*)    Neutro Abs 9.4 (*)    Eosinophils Absolute 1.8 (*)    Abs Immature Granulocytes 0.33 (*)    All other components within normal limits  LACTIC ACID, PLASMA - Abnormal; Notable for the following components:   Lactic Acid,  Venous 5.6 (*)    All other components within normal limits  CBG MONITORING, ED - Abnormal; Notable for the following components:   Glucose-Capillary 193 (*)    All other components within normal limits  I-STAT CREATININE, ED - Abnormal; Notable for the following components:   Creatinine, Ser 4.30 (*)    All other components within normal limits  POCT I-STAT 7, (LYTES, BLD GAS, ICA,H+H) - Abnormal; Notable for the following components:   pH, Arterial 7.222 (*)    pO2, Arterial 319.0 (*)    Bicarbonate 16.8 (*)    TCO2 18 (*)    Acid-base deficit 10.0 (*)    Potassium 5.3 (*)    HCT 34.0 (*)    Hemoglobin 11.6 (*)    All other components within normal limits  POCT I-STAT EG7 - Abnormal; Notable for the following components:   pH, Ven 7.166 (*)    pO2, Ven 99.0 (*)    Bicarbonate 18.8 (*)    TCO2 20 (*)    Acid-base deficit 10.0 (*)    Potassium 5.7 (*)    Calcium, Ion 1.07 (*)    HCT 35.0 (*)    Hemoglobin 11.9 (*)    All other components within normal limits  SARS CORONAVIRUS 2 (HOSPITAL ORDER, [facility])  URINE CULTURE  CULTURE, BLOOD (ROUTINE X 2)  CULTURE, BLOOD (ROUTINE X 2)  ETHANOL  PROTIME-INR  URINALYSIS, ROUTINE W REFLEX MICROSCOPIC  LACTIC ACID, PLASMA  COMPREHENSIVE METABOLIC PANEL  TROPONIN I  HIV ANTIBODY (ROUTINE TESTING W REFLEX)  CBG MONITORING, ED  TYPE AND SCREEN  ABO/RH    EKG EKG Interpretation  Date/Time:  Sunday Jul 19 2018 23:18:05 EDT Ventricular Rate:  55 PR Interval:    QRS Duration: 154 QT Interval:  513 QTC Calculation: 491 R Axis:   -26 Text Interpretation:  Junctional rhythm Right bundle branch block When compared with ECG of EARLIER SAME DATE HEART RATE has increased Right bundle branch block is now present Confirmed by Delora Fuel ([id number]) on 08/10/2018 11:47:55 PM   Radiology  Dg Chest Portable 1 View  Result Date: 08/10/2018 CLINICAL DATA:  68 y/o F; patient coded, respiratory distress, intubation.  EXAM: PORTABLE CHEST 1 VIEW COMPARISON:  None. FINDINGS: Normal cardiac silhouette given projection and technique. Post median sternotomy with wires intact. Endotracheal tube tip 3.4 cm above the carina. Enteric tube tip extends below the field of view into the abdomen. Aortic atherosclerosis with calcification. Clear lungs. No pleural effusion or pneumothorax. No acute osseous abnormality is evident. IMPRESSION: 1. Endotracheal tube tip 3.4 cm above the carina. Enteric tube tip extends below the field of view into the abdomen. 2. No acute pulmonary process identified. Electronically Signed   By: Kristine Garbe M.D.   On: 08/16/2018 22:25    Procedures Procedure Name: Intubation Date/Time: 08/04/2018 11:34 PM Performed by: Sherwood Gambler, MD Pre-anesthesia Checklist: Patient identified, Patient being monitored, Emergency Drugs available, Timeout performed and Suction available Oxygen Delivery Method: Non-rebreather mask Preoxygenation: Pre-oxygenation with 100% oxygen Induction Type: Rapid sequence Ventilation: Mask ventilation without difficulty Laryngoscope Size: Glidescope and 3 Grade View: Grade I Airway Equipment and Method: Video-laryngoscopy Placement Confirmation: ETT inserted through vocal cords under direct vision,  CO2 detector and Breath sounds checked- equal and bilateral Secured at: 22 cm Tube secured with: ETT holder Dental Injury: Teeth and Oropharynx as per pre-operative assessment     .Critical Care Performed by: Sherwood Gambler, MD Authorized by: Sherwood Gambler, MD   Critical care provider statement:    Critical care time (minutes):  120   Critical care time was exclusive of:  Separately billable procedures and treating other patients   Critical care was necessary to treat or prevent imminent or life-threatening deterioration of the following conditions:  CNS failure or compromise, cardiac failure and respiratory failure   Critical care was time spent  personally by me on the following activities:  Development of treatment plan with patient or surrogate, discussions with consultants, evaluation of patient's response to treatment, examination of patient, obtaining history from patient or surrogate, ordering and performing treatments and interventions, ordering and review of laboratory studies, ordering and review of radiographic studies, pulse oximetry, review of old charts, re-evaluation of patient's condition, ventilator management and vascular access procedures ARTERIAL LINE Date/Time: 08/09/2018 11:35 PM Performed by: Sherwood Gambler, MD Authorized by: Sherwood Gambler, MD   Consent:    Consent obtained:  Emergent situation Procedure details:    Location:  L femoral Comments:     Unsuccessful attempt, unable to thread.   (including critical care time)  Cardiopulmonary Resuscitation (CPR) Procedure Note Directed/Performed by: Ephraim Hamburger I personally directed ancillary staff and/or performed CPR in an effort to regain return of spontaneous circulation and to maintain cardiac, neuro and systemic perfusion.    Medications Ordered in ED Medications  fentaNYL (SUBLIMAZE) injection 50 mcg (has no administration in time range)  amiodarone (NEXTERONE) 1.8 mg/mL load via infusion 150 mg (has no administration in time range)    Followed by  amiodarone (NEXTERONE PREMIX) 360-4.14 MG/200ML-% (1.8 mg/mL) IV infusion (60 mg/hr Intravenous New Bag/Given 08/15/2018 2334)    Followed by  amiodarone (NEXTERONE PREMIX) 360-4.14 MG/200ML-% (1.8 mg/mL) IV infusion (has no administration in time range)  calcium chloride 10 % injection (has no administration in time range)  EPINEPHrine (ADRENALIN) 4 mg in dextrose 5 % 250 mL (0.016 mg/mL) infusion (7 mcg/min Intravenous Rate/Dose Change 07/18/2018 2337)  heparin injection 4,000 Units (has no administration in time range)  aspirin suppository 300 mg (has no administration in time range)  heparin 5000  UNIT/ML  injection (has no administration in time range)  norepinephrine (LEVOPHED) 4mg  in 241mL premix infusion (5 mcg/min Intravenous Rate/Dose Change 08/02/2018 2338)  sodium bicarbonate 150 mEq in dextrose 5 % 1,000 mL infusion ( Intravenous New Bag/Given 07/26/2018 2300)  sodium zirconium cyclosilicate (LOKELMA) packet 10 g (has no administration in time range)  etomidate (AMIDATE) injection 20 mg (20 mg Intravenous Given 08/05/2018 2111)  rocuronium (ZEMURON) injection 80 mg (80 mg Intravenous Given 07/31/2018 2113)     Initial Impression / Assessment and Plan / ED Course  I have reviewed the triage vital signs and the nursing notes.  Pertinent labs & imaging results that were available during my care of the patient were reviewed by me and considered in my medical decision making (see chart for details).        Patient presents status post cardiac arrest.  Originally, stable vitals and so her Edison Pace airway was changed to an ET tube.  RSI performed without difficulty.  Shortly after this, she did decompensate and coded and I was called emergently back to the bedside while talking to family.  Prolonged code for about 20 minutes with multiple epinephrines and other meds, please see code documentation.  She did have ventricular fibrillation multiple times and was shocked multiple times.  Eventually she did recover and was actually hypertensive.  ECG initially shows ST elevation concerning for an acute infarct.  I originally called a code STEMI and discussed with cardiology, Dr. Earnie Larsson.  He has come to evaluate patient and reviewed ECGs but at this point does not think she is stable enough for the Cath Lab and her prognosis is not good overall.  Attempted arterial line given the waxing and waning blood pressures but difficult to thread in her left femoral artery.  Now, her blood pressures are better and she has been weaned off some of the pressors.  ICU has been consulted and they will admit.  Originally wanted some  hypothermia protocol so ice packs will be applied.  Unclear exact cause but given the subsequent ventricular fibrillation and sudden onset without previously being ill, concern for cardiac cause.  I have discussed the patient's overall poor prognosis with the POA/daughter.  Admit to the ICU in critical condition.  Final Clinical Impressions(s) / ED Diagnoses   Final diagnoses:  Cardiac arrest ([place])  Acute respiratory failure with hypercapnia ([place])  Acidosis  Ventricular fibrillation [facility])    ED Discharge Orders    None       Sherwood Gambler, MD 07/20/18 0010

## 2018-07-19 NOTE — ED Notes (Signed)
RT and MD at bedside attempting A line

## 2018-07-20 ENCOUNTER — Inpatient Hospital Stay (HOSPITAL_COMMUNITY): Payer: Medicare Other

## 2018-07-20 DIAGNOSIS — G9340 Encephalopathy, unspecified: Secondary | ICD-10-CM

## 2018-07-20 DIAGNOSIS — I469 Cardiac arrest, cause unspecified: Secondary | ICD-10-CM

## 2018-07-20 DIAGNOSIS — N179 Acute kidney failure, unspecified: Secondary | ICD-10-CM

## 2018-07-20 DIAGNOSIS — G931 Anoxic brain damage, not elsewhere classified: Secondary | ICD-10-CM

## 2018-07-20 LAB — URINALYSIS, ROUTINE W REFLEX MICROSCOPIC
Bilirubin Urine: NEGATIVE
Glucose, UA: 50 mg/dL — AB
Ketones, ur: NEGATIVE mg/dL
Leukocytes,Ua: NEGATIVE
Nitrite: NEGATIVE
Protein, ur: >=300 mg/dL — AB
Specific Gravity, Urine: 1.01 (ref 1.005–1.030)
pH: 6 (ref 5.0–8.0)

## 2018-07-20 LAB — COMPREHENSIVE METABOLIC PANEL
ALT: 222 U/L — ABNORMAL HIGH (ref 0–44)
AST: 412 U/L — ABNORMAL HIGH (ref 15–41)
Albumin: 2.4 g/dL — ABNORMAL LOW (ref 3.5–5.0)
Alkaline Phosphatase: 87 U/L (ref 38–126)
Anion gap: 12 (ref 5–15)
BUN: 57 mg/dL — ABNORMAL HIGH (ref 8–23)
CO2: 17 mmol/L — ABNORMAL LOW (ref 22–32)
Calcium: 9.9 mg/dL (ref 8.9–10.3)
Chloride: 106 mmol/L (ref 98–111)
Creatinine, Ser: 4.2 mg/dL — ABNORMAL HIGH (ref 0.44–1.00)
GFR calc Af Amer: 12 mL/min — ABNORMAL LOW (ref >60)
GFR calc non Af Amer: 10 mL/min — ABNORMAL LOW (ref >60)
Glucose, Bld: 476 mg/dL — ABNORMAL HIGH (ref 70–99)
Potassium: 5.7 mmol/L — ABNORMAL HIGH (ref 3.5–5.1)
Sodium: 135 mmol/L (ref 135–145)
Total Bilirubin: 1 mg/dL (ref 0.3–1.2)
Total Protein: 5.1 g/dL — ABNORMAL LOW (ref 6.5–8.1)

## 2018-07-20 LAB — CBC
HCT: 34.6 % — ABNORMAL LOW (ref 36.0–46.0)
Hemoglobin: 10.7 g/dL — ABNORMAL LOW (ref 12.0–15.0)
MCH: 28.1 pg (ref 26.0–34.0)
MCHC: 30.9 g/dL (ref 30.0–36.0)
MCV: 90.8 fL (ref 80.0–100.0)
Platelets: 208 10*3/uL (ref 150–400)
RBC: 3.81 MIL/uL — ABNORMAL LOW (ref 3.87–5.11)
RDW: 14.2 % (ref 11.5–15.5)
WBC: 14 10*3/uL — ABNORMAL HIGH (ref 4.0–10.5)
nRBC: 0 % (ref 0.0–0.2)

## 2018-07-20 LAB — TROPONIN I
Troponin I: 2.07 ng/mL (ref <0.03)
Troponin I: 2.11 ng/mL (ref <0.03)
Troponin I: 2.08 ng/mL (ref <0.03)
Troponin I: 1.35 ng/mL (ref <0.03)

## 2018-07-20 LAB — GLUCOSE, CAPILLARY
Glucose-Capillary: 380 mg/dL — ABNORMAL HIGH (ref 70–99)
Glucose-Capillary: 411 mg/dL — ABNORMAL HIGH (ref 70–99)
Glucose-Capillary: 442 mg/dL — ABNORMAL HIGH (ref 70–99)
Glucose-Capillary: 406 mg/dL — ABNORMAL HIGH (ref 70–99)
Glucose-Capillary: 325 mg/dL — ABNORMAL HIGH (ref 70–99)
Glucose-Capillary: 18 mg/dL — CL (ref 70–99)
Glucose-Capillary: 113 mg/dL — ABNORMAL HIGH (ref 70–99)

## 2018-07-20 LAB — POCT I-STAT 7, (LYTES, BLD GAS, ICA,H+H)
Acid-base deficit: 3 mmol/L — ABNORMAL HIGH (ref 0.0–2.0)
Bicarbonate: 22.2 mmol/L (ref 20.0–28.0)
Calcium, Ion: 1.43 mmol/L — ABNORMAL HIGH (ref 1.15–1.40)
HCT: 35 % — ABNORMAL LOW (ref 36.0–46.0)
Hemoglobin: 11.9 g/dL — ABNORMAL LOW (ref 12.0–15.0)
O2 Saturation: 100 %
Patient temperature: 33.5
Potassium: 6 mmol/L — ABNORMAL HIGH (ref 3.5–5.1)
Sodium: 133 mmol/L — ABNORMAL LOW (ref 135–145)
TCO2: 23 mmol/L (ref 22–32)
pCO2 arterial: 34.9 mmHg (ref 32.0–48.0)
pH, Arterial: 7.395 (ref 7.350–7.450)
pO2, Arterial: 421 mmHg — ABNORMAL HIGH (ref 83.0–108.0)

## 2018-07-20 LAB — RAPID URINE DRUG SCREEN, HOSP PERFORMED
Amphetamines: NOT DETECTED
Barbiturates: NOT DETECTED
Benzodiazepines: POSITIVE — AB
Cocaine: NOT DETECTED
Opiates: NOT DETECTED
Tetrahydrocannabinol: NOT DETECTED

## 2018-07-20 LAB — RENAL FUNCTION PANEL
Albumin: 2.3 g/dL — ABNORMAL LOW (ref 3.5–5.0)
Anion gap: 12 (ref 5–15)
BUN: 45 mg/dL — ABNORMAL HIGH (ref 8–23)
CO2: 19 mmol/L — ABNORMAL LOW (ref 22–32)
Calcium: 8.1 mg/dL — ABNORMAL LOW (ref 8.9–10.3)
Chloride: 106 mmol/L (ref 98–111)
Creatinine, Ser: 3.57 mg/dL — ABNORMAL HIGH (ref 0.44–1.00)
GFR calc Af Amer: 14 mL/min — ABNORMAL LOW (ref >60)
GFR calc non Af Amer: 12 mL/min — ABNORMAL LOW (ref >60)
Glucose, Bld: 44 mg/dL — CL (ref 70–99)
Phosphorus: 4.5 mg/dL (ref 2.5–4.6)
Potassium: 4.6 mmol/L (ref 3.5–5.1)
Sodium: 137 mmol/L (ref 135–145)

## 2018-07-20 LAB — TRIGLYCERIDES: Triglycerides: 54 mg/dL (ref <150)

## 2018-07-20 LAB — POCT ACTIVATED CLOTTING TIME
Activated Clotting Time: 153 seconds
Activated Clotting Time: 153 seconds
Activated Clotting Time: 169 seconds
Activated Clotting Time: 164 seconds

## 2018-07-20 LAB — COOXEMETRY PANEL
Carboxyhemoglobin: 1.3 % (ref 0.5–1.5)
Methemoglobin: 1.5 % (ref 0.0–1.5)
O2 Saturation: 76.1 %
Total hemoglobin: 11.4 g/dL — ABNORMAL LOW (ref 12.0–16.0)

## 2018-07-20 LAB — LACTIC ACID, PLASMA
Lactic Acid, Venous: 2.3 mmol/L (ref 0.5–1.9)
Lactic Acid, Venous: 2.4 mmol/L (ref 0.5–1.9)

## 2018-07-20 LAB — HEPARIN LEVEL (UNFRACTIONATED)
Heparin Unfractionated: 0.42 IU/mL (ref 0.30–0.70)
Heparin Unfractionated: 0.21 IU/mL — ABNORMAL LOW (ref 0.30–0.70)

## 2018-07-20 LAB — ECHOCARDIOGRAM COMPLETE
Height: 59 in
Weight: 2003.54 oz

## 2018-07-20 LAB — MRSA PCR SCREENING: MRSA by PCR: NEGATIVE

## 2018-07-20 LAB — MAGNESIUM: Magnesium: 2.4 mg/dL (ref 1.7–2.4)

## 2018-07-20 LAB — ABO/RH: ABO/RH(D): A POS

## 2018-07-20 LAB — HIV ANTIBODY (ROUTINE TESTING W REFLEX): HIV Screen 4th Generation wRfx: NONREACTIVE

## 2018-07-20 LAB — PHOSPHORUS: Phosphorus: 5.3 mg/dL — ABNORMAL HIGH (ref 2.5–4.6)

## 2018-07-20 MED ORDER — LEVETIRACETAM IN NACL 1000 MG/100ML IV SOLN: 1000.0000 mg | INTRAVENOUS | Status: DC

## 2018-07-20 MED ORDER — HEPARIN SODIUM (PORCINE) 1000 UNIT/ML DIALYSIS
1000.0000 [IU] | INTRAMUSCULAR | Status: DC | PRN
  Administered 2018-07-22: 2400 [IU] via INTRAVENOUS_CENTRAL
  Filled 2018-07-20 (×3): qty 6

## 2018-07-20 MED ORDER — ASPIRIN 300 MG RE SUPP
300.0000 mg | Freq: Every day | RECTAL | Status: DC
  Administered 2018-07-20 – 2018-07-23 (×4): 300 mg via RECTAL
  Filled 2018-07-20 (×5): qty 1

## 2018-07-20 MED ORDER — MIDAZOLAM HCL 2 MG/2ML IJ SOLN
INTRAMUSCULAR | Status: AC
  Administered 2018-07-20: 13:00:00 2 mg via INTRAVENOUS
  Filled 2018-07-20: qty 2

## 2018-07-20 MED ORDER — HYDRALAZINE HCL 20 MG/ML IJ SOLN
INTRAMUSCULAR | Status: AC
  Filled 2018-07-20: qty 1

## 2018-07-20 MED ORDER — ACETAMINOPHEN 650 MG RE SUPP
650.0000 mg | Freq: Once | RECTAL | Status: AC
  Administered 2018-07-20: 650 mg via RECTAL
  Filled 2018-07-20: qty 1

## 2018-07-20 MED ORDER — PRISMASOL BGK 4/2.5 32-4-2.5 MEQ/L REPLACEMENT SOLN
Status: DC
  Administered 2018-07-20: 18:00:00 via INTRAVENOUS_CENTRAL
  Administered 2018-07-21: 1 via INTRAVENOUS_CENTRAL
  Administered 2018-07-21 – 2018-07-22 (×3): via INTRAVENOUS_CENTRAL
  Filled 2018-07-20 (×8): qty 5000

## 2018-07-20 MED ORDER — DEXTROSE 50 % IV SOLN
INTRAVENOUS | Status: AC
  Administered 2018-07-20: 50 mL
  Filled 2018-07-20: qty 50

## 2018-07-20 MED ORDER — SODIUM CHLORIDE 0.9 % IV SOLN
250.0000 [IU]/h | INTRAVENOUS | Status: DC
  Administered 2018-07-20: 250 [IU]/h via INTRAVENOUS_CENTRAL
  Administered 2018-07-20: 550 [IU]/h via INTRAVENOUS_CENTRAL
  Filled 2018-07-20 (×3): qty 2

## 2018-07-20 MED ORDER — FENTANYL CITRATE (PF) 100 MCG/2ML IJ SOLN
25.0000 ug | Freq: Once | INTRAMUSCULAR | Status: AC
  Administered 2018-07-20: 25 ug via INTRAVENOUS

## 2018-07-20 MED ORDER — FENTANYL BOLUS VIA INFUSION
25.0000 ug | INTRAVENOUS | Status: DC | PRN
  Filled 2018-07-20: qty 25

## 2018-07-20 MED ORDER — PRISMASOL BGK 4/2.5 32-4-2.5 MEQ/L REPLACEMENT SOLN
Status: DC
  Filled 2018-07-20 (×2): qty 5000

## 2018-07-20 MED ORDER — SODIUM CHLORIDE 0.9 % IV BOLUS
500.0000 mL | Freq: Once | INTRAVENOUS | Status: AC
  Administered 2018-07-20: 500 mL via INTRAVENOUS

## 2018-07-20 MED ORDER — MIDAZOLAM HCL 2 MG/2ML IJ SOLN
2.0000 mg | Freq: Once | INTRAMUSCULAR | Status: AC
  Administered 2018-07-20: 2 mg via INTRAVENOUS

## 2018-07-20 MED ORDER — IPRATROPIUM-ALBUTEROL 0.5-2.5 (3) MG/3ML IN SOLN: 3.0000 mL | Freq: Four times a day (QID) | RESPIRATORY_TRACT | Status: DC | PRN

## 2018-07-20 MED ORDER — PROPOFOL 1000 MG/100ML IV EMUL: 5.0000 ug/kg/min | INTRAVENOUS | Status: DC

## 2018-07-20 MED ORDER — HYDRALAZINE HCL 20 MG/ML IJ SOLN
10.0000 mg | INTRAMUSCULAR | Status: DC | PRN
  Filled 2018-07-20: qty 1

## 2018-07-20 MED ORDER — MIDAZOLAM HCL 2 MG/2ML IJ SOLN
2.0000 mg | INTRAMUSCULAR | Status: DC | PRN
  Administered 2018-07-21: 2 mg via INTRAVENOUS
  Filled 2018-07-20: qty 2

## 2018-07-20 MED ORDER — MIDAZOLAM HCL 2 MG/2ML IJ SOLN
INTRAMUSCULAR | Status: AC
  Filled 2018-07-20: qty 2

## 2018-07-20 MED ORDER — HEPARIN (PORCINE) 25000 UT/250ML-% IV SOLN
700.0000 [IU]/h | INTRAVENOUS | Status: DC
  Administered 2018-07-20: 700 [IU]/h via INTRAVENOUS
  Filled 2018-07-20: qty 250

## 2018-07-20 MED ORDER — INSULIN ASPART 100 UNIT/ML ~~LOC~~ SOLN
0.0000 [IU] | SUBCUTANEOUS | Status: DC
  Administered 2018-07-20: 15 [IU] via SUBCUTANEOUS
  Administered 2018-07-20: 20 [IU] via SUBCUTANEOUS
  Administered 2018-07-21: 3 [IU] via SUBCUTANEOUS

## 2018-07-20 MED ORDER — PRISMASOL BGK 4/2.5 32-4-2.5 MEQ/L REPLACEMENT SOLN
Status: DC
  Administered 2018-07-20: 18:00:00 via INTRAVENOUS_CENTRAL
  Administered 2018-07-21: 1 via INTRAVENOUS_CENTRAL
  Administered 2018-07-22 (×2): via INTRAVENOUS_CENTRAL
  Filled 2018-07-20 (×5): qty 5000

## 2018-07-20 MED ORDER — SODIUM CHLORIDE 0.9% FLUSH: 10.0000 mL | INTRAVENOUS | Status: DC | PRN

## 2018-07-20 MED ORDER — SODIUM CHLORIDE 0.9% FLUSH
10.0000 mL | Freq: Two times a day (BID) | INTRAVENOUS | Status: DC
  Administered 2018-07-23 – 2018-07-24 (×2): 10 mL

## 2018-07-20 MED ORDER — LEVETIRACETAM IN NACL 1000 MG/100ML IV SOLN
1000.0000 mg | Freq: Two times a day (BID) | INTRAVENOUS | Status: DC
  Administered 2018-07-20: 1000 mg via INTRAVENOUS
  Filled 2018-07-20: qty 100

## 2018-07-20 MED ORDER — NOREPINEPHRINE 16 MG/250ML-% IV SOLN
0.0000 ug/min | INTRAVENOUS | Status: DC
  Administered 2018-07-20: 5 ug/min via INTRAVENOUS
  Administered 2018-07-21: 14 ug/min via INTRAVENOUS
  Administered 2018-07-23 (×3): 40 ug/min via INTRAVENOUS
  Filled 2018-07-20 (×8): qty 250

## 2018-07-20 MED ORDER — PRISMASOL BGK 4/2.5 32-4-2.5 MEQ/L IV SOLN
INTRAVENOUS | Status: DC
  Administered 2018-07-20 – 2018-07-21 (×3): via INTRAVENOUS_CENTRAL
  Administered 2018-07-21: 1 mL via INTRAVENOUS_CENTRAL
  Administered 2018-07-21 (×3): via INTRAVENOUS_CENTRAL
  Administered 2018-07-21 (×2): 1 mL via INTRAVENOUS_CENTRAL
  Administered 2018-07-21 – 2018-07-24 (×10): via INTRAVENOUS_CENTRAL
  Filled 2018-07-20 (×35): qty 5000

## 2018-07-20 MED ORDER — FENTANYL 2500MCG IN NS 250ML (10MCG/ML) PREMIX INFUSION
25.0000 ug/h | INTRAVENOUS | Status: DC
  Administered 2018-07-20: 25 ug/h via INTRAVENOUS
  Administered 2018-07-22: 50 ug/h via INTRAVENOUS
  Filled 2018-07-20 (×2): qty 250

## 2018-07-20 MED ORDER — HYDRALAZINE HCL 20 MG/ML IJ SOLN
20.0000 mg | Freq: Once | INTRAMUSCULAR | Status: AC
  Administered 2018-07-20: 20 mg via INTRAVENOUS

## 2018-07-20 MED ORDER — CHLORHEXIDINE GLUCONATE CLOTH 2 % EX PADS
6.0000 | MEDICATED_PAD | Freq: Every day | CUTANEOUS | Status: DC
  Administered 2018-07-20 – 2018-07-24 (×5): 6 via TOPICAL

## 2018-07-20 MED ORDER — HEPARIN BOLUS VIA INFUSION (CRRT)
1000.0000 [IU] | INTRAVENOUS | Status: DC | PRN
  Filled 2018-07-20: qty 1000

## 2018-07-20 MED ORDER — MIDAZOLAM 50MG/50ML (1MG/ML) PREMIX INFUSION
20.0000 mg/h | INTRAVENOUS | Status: DC
  Administered 2018-07-20 – 2018-07-21 (×3): 20 mg/h via INTRAVENOUS
  Filled 2018-07-20: qty 50
  Filled 2018-07-20: qty 100
  Filled 2018-07-20: qty 50

## 2018-07-20 MED ORDER — LEVETIRACETAM IN NACL 1500 MG/100ML IV SOLN
1500.0000 mg | Freq: Once | INTRAVENOUS | Status: AC
  Administered 2018-07-20: 1500 mg via INTRAVENOUS
  Filled 2018-07-20: qty 100

## 2018-07-20 MED ORDER — LEVETIRACETAM 250 MG PO TABS: 1000.0000 mg | ORAL_TABLET | Freq: Two times a day (BID) | ORAL | Status: DC

## 2018-07-20 MED FILL — Medication: Qty: 1 | Status: AC

## 2018-07-20 NOTE — Progress Notes (Signed)
  Echocardiogram 2D Echocardiogram has been performed.  Rebecca Benton 07/20/2018, 9:34 AM

## 2018-07-20 NOTE — Progress Notes (Signed)
Initial Nutrition Assessment  DOCUMENTATION CODES:   Not applicable  INTERVENTION:   If continuing with aggressive treatment, recommend initiation of enteral nutrition: - Start Vital AF 1.2 @ 20 ml/hr and increase by 10 ml/hr q 8 hours until goal rate of 50 ml/hr is reached (1200 ml/day)  Recommended tube feeding regimen provides 1440 kcal, 90 grams of protein, and 973 ml of H2O (95% of kcal needs, 100% of protein needs).  NUTRITION DIAGNOSIS:   Inadequate oral intake related to inability to eat as evidenced by NPO status.  GOAL:   Patient will meet greater than or equal to 90% of their needs  MONITOR:   Vent status, Labs, Weight trends, I & O's  REASON FOR ASSESSMENT:   Ventilator    ASSESSMENT:   68 year old female who presented to the ED on 5/3 after a witnessed PEA arrest. PMH of CKD stage IV, COPD, DM, HTN, CAD s/p CABG 2016, right BKA 2017. While in the ED, pt had a V-fib arrest. Pt intubated and transferred to the ICU. TTM deferred due to prolonged arrest.  Per CCM, pt remains unresponsive and starting to have myoclonus. Noted pt's prognosis is poor with concern for anoxic brain injury but that family would like to pursue aggressive treatment. Neurology and Nephrology have been consulted.  Discussed pt with RN and during ICU rounds.  OGT in place to low intermittent suction.  Patient is currently intubated on ventilator support MV: 9.7 L/min Temp (24hrs), Avg:94.3 F (34.6 C), Min:91.5 F (33.1 C), Max:101.7 F (38.7 C) BP (a-line): 142/36 MAP (a-line): 65  Amiodarone: 16.7 ml/hr Epinephrine: 37.5 ml/hr Heparin: 7 ml/hr Sodium bicarb: 40 ml/hr  Medications reviewed and include: SSI q 4 hours, Protonix  Labs reviewed: potassium 5.7 (H), BUN 57 (H), creatinine 4.20 (H), phosphorus 5.3 (H), elevated LFTs CBG's: 325, 406, 442, 411, 380 x 12 hours  UOP: 125 ml x 24 hours I/O's: +2.0 L since admit  NUTRITION - FOCUSED PHYSICAL EXAM:  Deferred.  Diet  Order:   Diet Order            Diet NPO time specified  Diet effective now              EDUCATION NEEDS:   Not appropriate for education at this time  Skin:  Skin Assessment: Reviewed RN Assessment  Last BM:  no documented BM  Height:   Ht Readings from Last 1 Encounters:  07/20/18 4\' 11"  (1.499 m)    Weight:   Wt Readings from Last 1 Encounters:  07/20/18 56.8 kg    Ideal Body Weight:  43.2 kg  BMI:  Body mass index is 25.29 kg/m.  Estimated Nutritional Needs:   Kcal:  1515  Protein:  75-90 grams  Fluid:  >/= 1.5 L    Gaynell Face, MS, RD, LDN Inpatient Clinical Dietitian Pager: [phone] Weekend/After Hours: [phone]

## 2018-07-20 NOTE — Progress Notes (Addendum)
CRITICAL VALUE ALERT  Critical Value:  BGL 39 (drawn from ART line)  Date & Time Notied:  Today, now  Provider Notified: Elink RN  Orders Received/Actions taken: Per protocol, given 1amp D50 while informing Elink RN a Dextrose gtt might be beneficial to pt. Waiting on orders. Will recheck BGL in 33mins.  **Recheck BGL 156. Will continue to monitor pt.

## 2018-07-20 NOTE — ED Notes (Signed)
Levophed increased to 14mcg, monitor unable to read BP

## 2018-07-20 NOTE — Procedures (Signed)
ELECTROENCEPHALOGRAM REPORT   Patient: Rebecca Benton       Room #: [facility] EEG No. ID: [id number] Age: 68 y.o.        Sex: female Referring Physician: Mariane Masters Report Date:  07/20/2018        Interpreting Physician: Alexis Goodell  History: [name] is an 68 y.o. female s/p arrest  Medications:  Amiodarone, Insulin, Protonix, Epinephrine, Heparin  Conditions of Recording:  This is a 21 channel routine scalp EEG performed with bipolar and monopolar montages arranged in accordance to the international 10/20 system of electrode placement. One channel was dedicated to EKG recording.  The patient is in the intubated and unresponsive state.  Description:  The background activity is discontinuous.  It consists of periods of attenuation alternating with short bursts of high voltage polyspike activity.  This high voltage polyspike activity is persistent throughout the recording and generalized.  It coincides with generalized myoclonic activity noted clinically as well.  This bursts are frequent occurring every 2 to 10 seconds with the shorter periods of af intervening attenuation being most predominant.    There is no change in the background with stimulation.   Hyperventilation and intermittent photic stimulation were not performed.  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  The bursts of generalized polyspike activity coincide with clinical myoclonic activity.     Alexis Goodell, MD Neurology [phone] 07/20/2018, 11:18 AM

## 2018-07-20 NOTE — ED Notes (Signed)
Granddaughter at bedside

## 2018-07-20 NOTE — ED Notes (Signed)
Epi gtt started d/t hypotension and bradycardia

## 2018-07-20 NOTE — Consult Note (Addendum)
NEURO HOSPITALIST CONSULT NOTE   Requestig physician: Dr. Mariane Masters  Reason for Consult: Anoxic brain injury  History obtained from:  Chart    HPI:                                                                                                                                          Rebecca Benton is a 68 y.o. female who presented on 5/3 after a witnessed prolonged cardiac arrest. The arrest was classified as PEA arrest. The code required 10 minutes of CPR by EMS. Overall clinical picture now is of multi-organ failure secondary to anoxia with dismal prognosis per ICU team and Cardiology.   The patient's PMHx includes CAD status post CABG in 2016, COPD, diabetes, hypertension and right BKAin 2017.   Past Medical History:  Diagnosis Date  . CKD (chronic kidney disease) stage 4, GFR 15-29 ml/min (HCC)   . COPD (chronic obstructive pulmonary disease) ([place])   . Diabetes ([place])   . Hx of CABG   . Hypertension   . Stroke [facility])     Past Surgical History:  Procedure Laterality Date  . BELOW KNEE LEG AMPUTATION    . CORONARY ARTERY BYPASS GRAFT    . TUBAL LIGATION      Family History  Problem Relation Age of Onset  . Heart disease Mother   . Diabetes Mother   . Heart disease Father   . Diabetes Father               Social History:  reports that she has quit smoking. She has never used smokeless tobacco. She reports previous alcohol use. She reports previous drug use.  Allergies  Allergen Reactions  . Penicillins   . Tape     MEDICATIONS:                                                                                                                     Prior to Admission:  Medications Prior to Admission  Medication Sig Dispense Refill Last Dose  . albuterol (PROVENTIL) (2.5 MG/3ML) 0.083% nebulizer solution Take 2.5 mg by nebulization 3 (three) times daily as needed for wheezing or shortness of breath.   UNK  . albuterol (VENTOLIN HFA) 108 (90 Base) MCG/ACT  inhaler Inhale 1-2 puffs into  the lungs every 6 (six) hours as needed for wheezing or shortness of breath.   UNK  . ALPRAZolam (XANAX) 1 MG tablet Take 1 mg by mouth 3 (three) times daily as needed for anxiety.   UNK  . amLODipine (NORVASC) 5 MG tablet Take 5 mg by mouth daily.   UNK  . aspirin EC 81 MG tablet Take 81 mg by mouth daily.   UNK  . atorvastatin (LIPITOR) 40 MG tablet Take 40 mg by mouth daily.   UNK  . carvedilol (COREG) 25 MG tablet Take 25 mg by mouth 2 (two) times daily with a meal.   UNK  . clopidogrel (PLAVIX) 75 MG tablet Take 75 mg by mouth daily.   UNK  . Cyanocobalamin 1000 MCG/ML KIT Inject 1 mL as directed every 30 (thirty) minutes.   07/11/2018  . DULoxetine (CYMBALTA) 30 MG capsule Take 30 mg by mouth daily.   UNK  . gabapentin (NEURONTIN) 300 MG capsule Take 300 mg by mouth at bedtime.   UNK  . isosorbide mononitrate (IMDUR) 30 MG 24 hr tablet Take 30 mg by mouth daily.   UNK  . oxyCODONE-acetaminophen (PERCOCET) 10-325 MG tablet Take 1 tablet by mouth every 6 (six) hours as needed for pain.   UNK   Scheduled: . amiodarone  150 mg Intravenous Once  . aspirin  300 mg Rectal Daily  . chlorhexidine gluconate (MEDLINE KIT)  15 mL Mouth Rinse BID  . insulin aspart  0-20 Units Subcutaneous Q4H  . mouth rinse  15 mL Mouth Rinse 10 times per day  . pantoprazole (PROTONIX) IV  40 mg Intravenous Daily   Continuous: .  prismasol BGK 4/2.5    .  prismasol BGK 4/2.5    . amiodarone 30 mg/hr (07/20/18 1400)  . epinephrine 10 mcg/min (07/20/18 1546)  . heparin 10,000 units/ 20 mL infusion syringe    . heparin Stopped (07/20/18 1526)  . levETIRAcetam    . prismasol BGK 4/2.5    .  sodium bicarbonate  infusion 1000 mL 40 mL/hr at 07/20/18 1400     ROS:                                                                                                                                       Unable to obtain due to coma   Blood pressure 115/76, pulse (!) 58, temperature (!)  101.3 F (38.5 C), resp. rate (!) 21, height 4' 11" (1.499 m), weight 56.8 kg, SpO2 97 %.   General Examination:                                                                                                         Physical Exam  HEENT-  [place]/AT    Lungs- Intubated on ventilator with asynchronous breathing due to myoclonus Extremities- Right BKA   Neurological Examination Mental Status: Comatose with no cortically-mediated responses to external stimuli.  Cranial Nerves: II: No blink to threat. PERRL.  III,IV, VI: Eyes conjugate. Intermittent low amplitude high frequency runs of pendular nystagmus occurring in synchrony with diffuse myoclonic jerks V,VII: Face is symmetric. No corneal reflexes VIII: No response to voice IX,X: Intubated XI: Head tends to exhibit rotatory drift to the left. Neck jerks to the left with full-body myoclonic episodes  XII: Intubated Motor: Flaccid tone x 4. Intermittent full-body myoclonic jerks occur synchronously in all 4 limbs simultaneously.  Between myoclonic jerks, tone is flaccid, except during occurrences of abductor/extensor posturing of upper extremities which appear both during and between myoclonic jerks.  Sensory: No responses to noxious stimuli. Deep Tendon Reflexes: 2+ and symmetric throughout Plantars: Right: BKA Left: Mute Cerebellar/Gait: Unable to assess   Lab Results: Basic Metabolic Panel: Recent Labs  Lab 07/23/2018 2131 08/14/2018 2135 07/20/2018 2144 07/20/18 0327 07/20/18 0446  NA 136 136 135 133* 135  K 5.8* 5.3* 5.7* 6.0* 5.7*  CL 105  --   --   --  106  CO2 14*  --   --   --  17*  GLUCOSE 262*  --   --   --  476*  BUN 51*  --   --   --  57*  CREATININE 4.41*  --  4.30*  --  4.20*  CALCIUM 8.5*  --   --   --  9.9  MG  --   --   --   --  2.4  PHOS  --   --   --   --  5.3*    CBC: Recent Labs  Lab 07/27/2018 2131 08/15/2018 2135 08/02/2018 2144 07/20/18 0327 07/20/18 0446  WBC 16.3*  --   --   --  14.0*  NEUTROABS 9.4*   --   --   --   --   HGB 11.2* 11.6* 11.9* 11.9* 10.7*  HCT 38.5 34.0* 35.0* 35.0* 34.6*  MCV 96.0  --   --   --  90.8  PLT 196  --   --   --  208    Cardiac Enzymes: Recent Labs  Lab 08/02/2018 2131 07/20/18 0446 07/20/18 1048  TROPONINI 0.03* 2.07* 2.11*    Lipid Panel: Recent Labs  Lab 07/20/18 0446  TRIG 54    Imaging: Ct Head Wo Contrast  Result Date: 07/20/2018 CLINICAL DATA:  Witnessed arrest EXAM: CT HEAD WITHOUT CONTRAST TECHNIQUE: Contiguous axial images were obtained from the base of the skull through the vertex without intravenous contrast. COMPARISON:  None. FINDINGS: Brain: Evidence of old infarct and encephalomalacia within the left frontal lobe. Chronic small vessel disease throughout the deep white matter. No acute intracranial abnormality. Specifically, no hemorrhage, hydrocephalus, mass lesion, acute infarction, or significant intracranial injury. Vascular: No hyperdense vessel or unexpected calcification. Skull: No acute calvarial abnormality. Sinuses/Orbits: Coastal thickening throughout the paranasal sinuses. Other: None IMPRESSION: Old left frontal infarct. No acute intracranial abnormality. Chronic sinusitis. Electronically Signed   By: Kevin  Dover M.D.   On: 07/20/2018 02:08   Dg Chest Port 1 View  Result Date: 07/20/2018 CLINICAL DATA:  68 y/o  F; central line placement. EXAM: PORTABLE CHEST 1 VIEW COMPARISON:  08/01/2018 chest radiograph FINDINGS: Endotracheal tube 3.1 cm above the carina. Enteric tube tip extends below the field   of view into the abdomen. Left central venous catheter tip projects over the upper SVC. No pneumothorax. Stable cardiomediastinal silhouette. Post median sternotomy with wires in alignment. No new consolidation, effusion, or pneumothorax. No acute osseous abnormality is evident. IMPRESSION: Left central venous catheter tip projects over upper SVC. No acute pulmonary process identified. Electronically Signed   By: Kristine Garbe  M.D.   On: 07/20/2018 03:10   Dg Chest Portable 1 View  Result Date: 07/21/2018 CLINICAL DATA:  68 y/o F; patient coded, respiratory distress, intubation. EXAM: PORTABLE CHEST 1 VIEW COMPARISON:  None. FINDINGS: Normal cardiac silhouette given projection and technique. Post median sternotomy with wires intact. Endotracheal tube tip 3.4 cm above the carina. Enteric tube tip extends below the field of view into the abdomen. Aortic atherosclerosis with calcification. Clear lungs. No pleural effusion or pneumothorax. No acute osseous abnormality is evident. IMPRESSION: 1. Endotracheal tube tip 3.4 cm above the carina. Enteric tube tip extends below the field of view into the abdomen. 2. No acute pulmonary process identified. Electronically Signed   By: Kristine Garbe M.D.   On: 08/11/2018 22:25   EEG: This is an abnormal electroencephalogram secondary to GPEDS superimposed on an attenuated background.  The bursts of generalized polyspike activity coincide with clinical myoclonic activity.    Assessment: 68 year old female with clinical presentation most consistent with diffuse anoxic brain injury secondary to prolonged cardiac arrest.  1. Myoclonus, GPEDs on EEG and attenuated EEG background all portend a dismal prognosis.  2. Old stroke on CT. No hypoxic/ischemic findings related to her cardiac arrest, but CT is frequently negative, especially if obtained after a short time interval following cardiac arrest.   Recommendations: 1. Keppra 1500 mg IV has been loaded. Continue scheduled dosing at 1000 mg IV QD given renal failure. Versed 20 mg/hr has also been started. If myoclonus does not resolve, consider increasing her Versed dose.  2. Valproic acid often works for myoclonus but is not an optimal choice in this case due to elevated transaminases.  3. Continue Versed for 24 hours to reduce risk of increased metabolic load from the myoclonus and associated potential complications. Versed should  also be useful, in conjunction with fentanyl, for control of the asynchrony on the vent which is felt most likely to be due to diaphragmatic myoclonus.   4. After 24 hours has elapsed, hold Versed and repeat exam. If myoclonus persists, this would be consistent with the dismal prognosis inferred from this evening's exam and discussion of goals of care with family should ensue.   50 minutes spent in the emergent neurological evaluation and management of this critically ill patient.    Electronically signed: Dr. Kerney Elbe 07/20/2018, 2:07 PM

## 2018-07-20 NOTE — Procedures (Signed)
Arterial Catheter Insertion Procedure Note Rebecca Benton [id number] 09/25/1950  Procedure: Insertion of Arterial Catheter  Indications: Blood pressure monitoring  Procedure Details Consent: Risks of procedure as well as the alternatives and risks of each were explained to the (patient/caregiver).  Consent for procedure obtained. Time Out: Verified patient identification, verified procedure, site/side was marked, verified correct patient position, special equipment/implants available, medications/allergies/relevent history reviewed, required imaging and test results available.  Performed  Maximum sterile technique was used including antiseptics, cap, gloves, gown, hand hygiene, mask and sheet. Skin prep: Chlorhexidine; local anesthetic administered 20 gauge catheter was inserted into left radial artery using the Seldinger technique. ULTRASOUND GUIDANCE USED: YES Evaluation Blood flow good; BP tracing good. Complications: No apparent complications.  Kennieth Rad, MSN, AGACNP-BC [facility] Pgr: [phone] or if no answer [phone] 07/20/2018, 3:30 AM

## 2018-07-20 NOTE — ED Notes (Signed)
Levophed gtt started at 48mcg

## 2018-07-20 NOTE — Progress Notes (Signed)
EEG completed; results pending.    

## 2018-07-20 NOTE — Progress Notes (Signed)
Hypoglycemic Event  CBG: 18 at 1722  Treatment: D50 50 mL (25 gm) at 1728  Symptoms: Sweaty, difficult to assess - pt intubated, unresponsive  Follow-up CBG: Time:1748 CBG Result:113  Possible Reasons for Event: Unknown  Comments/MD notified:Dr. Lorene Dy, Kami Kube Dyann Ruddle

## 2018-07-20 NOTE — ED Notes (Signed)
Epi gtt increased to 80mcg d/t BP 67/58

## 2018-07-20 NOTE — ED Notes (Signed)
Pt in bigeminy, HR 50's, +pulses

## 2018-07-20 NOTE — Progress Notes (Signed)
ANTICOAGULATION CONSULT NOTE - Initial Consult  Pharmacy Consult for heparin Indication: chest pain/ACS  Allergies  Allergen Reactions  . Penicillins   . Tape     Patient Measurements: Height: _0  (149.9 cm) Weight: 125 lb (56.7 kg) IBW/kg (Calculated) : 43.2  Vital Signs: Temp: 92.1 F (33.4 C) (05/04 0345) Temp Source: Tympanic (05/03 2057) BP: 180/78 (05/04 0228) Pulse Rate: 43 (05/04 0345)  Labs: Recent Labs    08/15/2018 2131 08/15/2018 2135 08/15/2018 2144  HGB 11.2* 11.6* 11.9*  HCT 38.5 34.0* 35.0*  PLT 196  --   --   LABPROT 15.1  --   --   INR 1.2  --   --   CREATININE 4.41*  --  4.30*  TROPONINI 0.03*  --   --     Estimated Creatinine Clearance: 9.5 mL/min (A) (by C-G formula based on SCr of 4.3 mg/dL (H)).   Medical History: Past Medical History:  Diagnosis Date  . CKD (chronic kidney disease) stage 4, GFR 15-29 ml/min (HCC)   . COPD (chronic obstructive pulmonary disease) ([place])   . Diabetes ([place])   . Hx of CABG   . Hypertension   . Stroke [facility])     Medications:  No medications prior to admission.   Scheduled:  . amiodarone  150 mg Intravenous Once  . aspirin  300 mg Rectal Once  . calcium chloride      . chlorhexidine gluconate (MEDLINE KIT)  15 mL Mouth Rinse BID  . fentaNYL (SUBLIMAZE) injection  50 mcg Intravenous Once  . heparin      . heparin  4,000 Units Intravenous Once  . hydrALAZINE      . insulin aspart  0-9 Units Subcutaneous Q4H  . mouth rinse  15 mL Mouth Rinse 10 times per day  . pantoprazole (PROTONIX) IV  40 mg Intravenous Daily  . sodium zirconium cyclosilicate  10 g Oral Once   Infusions:  . amiodarone    . epinephrine 7 mcg/min (07/20/2018 2337)  . norepinephrine (LEVOPHED) Adult infusion 1 mcg/min (07/20/18 0145)  . propofol (DIPRIVAN) infusion    .  sodium bicarbonate  infusion 1000 mL 75 mL/hr at 08/02/2018 2300    Assessment: 68yo female presented as code STEMI after PEA/Vfib arrests, was not brought to cath  lab given risks > benefit, now to start heparin.  Goal of Therapy:  Heparin level 0.3-0.7 units/ml Monitor platelets by anticoagulation protocol: Yes   Plan:  Heparin bolus ordered in ED but unclear whether given; will start heparin gtt at 700 units/hr and monitor heparin levels and CBC.  Wynona Neat, PharmD, BCPS  07/20/2018,3:53 AM

## 2018-07-20 NOTE — ED Notes (Signed)
Levophed increased to 33mcg, RT and MD unable to establish art line in groin or wrist

## 2018-07-20 NOTE — Progress Notes (Addendum)
PCCM Interval Note   CTH negative for acute changes, shows old left frontal infarct.  Remains unresponsive.  Starting to have myoclonus.  Since aline placement, SBP reading 220/77 -correlates with cuff pressure- unclear if this is cushings or other neurological related response Off levophed and epi drips now Ongoing sinus bradycardia 30-40's Ongoing hypothermia   P:  Will start heparin per pharmacy protocol for suspected ACS coox now Pending am labs  bair hugger for rewarming Apresoline 20 mg IV now.   If ongoing, consider adding propofol which will help both myoclonus and hypertension Add on UDS   Kennieth Rad, MSN, AGACNP-BC [facility] [facility] Pgr: [phone] or if no answer [phone] 07/20/2018, 3:34 AM

## 2018-07-20 NOTE — Consult Note (Signed)
[facility] Nephrology Consultation Note  Requesting MD: Dr Rodman Pickle  Reason for consult: AKI  HPI:  Rebecca Benton is a 68 y.o. female.  With history of hypertension, CAD status post CABG, stroke, right BKA, COPD, CKD stage IV as per problem list presented with witnessed cardiac arrest outside the hospital and patient had second PEA arrest in the ER.  Patient is intubated.  She developed cardiogenic shock and currently on epinephrine IV and amiodarone.  On admission, the serum creatinine level was 4.41, CO2 14 and potassium 5.8.  Since last night there is only 100 cc of urine output and serum creatinine level remains 4.2, potassium 5.7.  Seen by cardiologist recommended no aggressive measures however family requesting second opinion.  We are consulted for the evaluation of AKI and management.  As per patient's daughter, patient had dialysis emergently about 5 years ago since then she has not been followed by any nephrologist.  Patient's daughter is herself for dialysis patient at [facility].    Patient is currently having myoclonic jerk, on Keppra and neurology was consulted.  Review of system is unable to obtain.  Creatinine, Ser  Date/Time Value Ref Range Status  07/20/2018 04:46 AM 4.20 (H) 0.44 - 1.00 mg/dL Final  08/02/2018 09:44 PM 4.30 (H) 0.44 - 1.00 mg/dL Final  07/28/2018 09:31 PM 4.41 (H) 0.44 - 1.00 mg/dL Final     PMHx:   Past Medical History:  Diagnosis Date  . CKD (chronic kidney disease) stage 4, GFR 15-29 ml/min (HCC)   . COPD (chronic obstructive pulmonary disease) ([place])   . Diabetes ([place])   . Hx of CABG   . Hypertension   . Stroke [facility])     Past Surgical History:  Procedure Laterality Date  . BELOW KNEE LEG AMPUTATION    . CORONARY ARTERY BYPASS GRAFT    . TUBAL LIGATION      Family Hx:  Family History  Problem Relation Age of Onset  . Heart disease Mother   . Diabetes Mother   . Heart disease Father   . Diabetes Father     Social  History:  reports that she has quit smoking. She has never used smokeless tobacco. She reports previous alcohol use. She reports previous drug use.  Allergies:  Allergies  Allergen Reactions  . Penicillins   . Tape     Medications: Prior to Admission medications   Not on File    I have reviewed the patient's current medications.  Labs:  Results for orders placed or performed during the hospital encounter of 07/28/2018 (from the past 48 hour(s))  Type and screen [place]     Status: None   Collection Time: 08/11/2018  9:30 PM  Result Value Ref Range   ABO/RH(D) A POS    Antibody Screen NEG    Sample Expiration      07/22/2018 Performed at [facility], [place] [address] [place], [place] [address]   Culture, blood (routine x 2)     Status: None (Preliminary result)   Collection Time: 08/06/2018  9:30 PM  Result Value Ref Range   Specimen Description BLOOD RIGHT ANTECUBITAL    Special Requests      BOTTLES DRAWN AEROBIC AND ANAEROBIC Blood Culture results may not be optimal due to an inadequate volume of blood received in culture bottles   Culture      NO GROWTH < 24 HOURS Performed at [facility] [address],  [place], [place] [address]    Report Status PENDING   ABO/Rh     Status: None   Collection Time: 08/12/2018  9:30 PM  Result Value Ref Range   ABO/RH(D)      A POS Performed at [facility] [address] [place], [place] [address]   Comprehensive metabolic panel     Status: Abnormal   Collection Time: 08/10/2018  9:31 PM  Result Value Ref Range   Sodium 136 135 - 145 mmol/L   Potassium 5.8 (H) 3.5 - 5.1 mmol/L   Chloride 105 98 - 111 mmol/L   CO2 14 (L) 22 - 32 mmol/L   Glucose, Bld 262 (H) 70 - 99 mg/dL   BUN 51 (H) 8 - 23 mg/dL   Creatinine, Ser 4.41 (H) 0.44 - 1.00 mg/dL   Calcium 8.5 (L) 8.9 - 10.3 mg/dL   Total Protein 6.0 (L) 6.5 - 8.1 g/dL   Albumin 2.8 (L) 3.5 - 5.0 g/dL   AST 102 (H) 15 - 41 U/L   ALT 82 (H) 0 -  44 U/L   Alkaline Phosphatase 100 38 - 126 U/L   Total Bilirubin 0.6 0.3 - 1.2 mg/dL   GFR calc non Af Amer 10 (L) >60 mL/min   GFR calc Af Amer 11 (L) >60 mL/min   Anion gap 17 (H) 5 - 15    Comment: Performed at [facility], [place] [address], [place], [place] [address]  Ethanol     Status: None   Collection Time: 08/09/2018  9:31 PM  Result Value Ref Range   Alcohol, Ethyl (B) <10 <10 mg/dL    Comment: (NOTE) Lowest detectable limit for serum alcohol is 10 mg/dL. For medical purposes only. Performed at [facility], [place] [address], [place], [place] [address]   Troponin I - Once     Status: Abnormal   Collection Time: 08/08/2018  9:31 PM  Result Value Ref Range   Troponin I 0.03 (HH) <0.03 ng/mL    Comment: CRITICAL RESULT CALLED TO, READ BACK BY AND VERIFIED WITH: OSORIO B,RN 08/03/2018 2341 WAYK Performed at [facility], [place] [address] [place] [address], [place], [place] [address]   CBC with Differential     Status: Abnormal   Collection Time: 07/28/2018  9:31 PM  Result Value Ref Range   WBC 16.3 (H) 4.0 - 10.5 K/uL   RBC 4.01 3.87 - 5.11 MIL/uL   Hemoglobin 11.2 (L) 12.0 - 15.0 g/dL   HCT 38.5 36.0 - 46.0 %   MCV 96.0 80.0 - 100.0 fL   MCH 27.9 26.0 - 34.0 pg   MCHC 29.1 (L) 30.0 - 36.0 g/dL   RDW 14.0 11.5 - 15.5 %   Platelets 196 150 - 400 K/uL   nRBC 0.1 0.0 - 0.2 %   Neutrophils Relative % 58 %   Neutro Abs 9.4 (H) 1.7 - 7.7 K/uL   Lymphocytes Relative 23 %   Lymphs Abs 3.7 0.7 - 4.0 K/uL   Monocytes Relative 6 %   Monocytes Absolute 1.0 0.1 - 1.0 K/uL   Eosinophils Relative 11 %   Eosinophils Absolute 1.8 (H) 0.0 - 0.5 K/uL   Basophils Relative 0 %   Basophils Absolute 0.1 0.0 - 0.1 K/uL   Immature Granulocytes 2 %   Abs Immature Granulocytes 0.33 (H) 0.00 - 0.07 K/uL    Comment: Performed at [facility] [address] [place], [place] [address]  Protime-INR  Status: None   Collection Time: 07/25/2018  9:31 PM  Result Value Ref Range   Prothrombin  Time 15.1 11.4 - 15.2 seconds   INR 1.2 0.8 - 1.2    Comment: (NOTE) INR goal varies based on device and disease states. Performed at [facility], [place] [address], [place], [place] [address]   Culture, blood (routine x 2)     Status: None (Preliminary result)   Collection Time: 07/30/2018  9:33 PM  Result Value Ref Range   Specimen Description BLOOD RIGHT WRIST    Special Requests      BOTTLES DRAWN AEROBIC AND ANAEROBIC Blood Culture results may not be optimal due to an inadequate volume of blood received in culture bottles   Culture      NO GROWTH < 24 HOURS Performed at [facility] [address] [place], [place] [address]    Report Status PENDING   I-STAT 7, (LYTES, BLD GAS, ICA, H+H)     Status: Abnormal   Collection Time: 08/13/2018  9:35 PM  Result Value Ref Range   pH, Arterial 7.222 (L) 7.350 - 7.450   pCO2 arterial 40.8 32.0 - 48.0 mmHg   pO2, Arterial 319.0 (H) 83.0 - 108.0 mmHg   Bicarbonate 16.8 (L) 20.0 - 28.0 mmol/L   TCO2 18 (L) 22 - 32 mmol/L   O2 Saturation 100.0 %   Acid-base deficit 10.0 (H) 0.0 - 2.0 mmol/L   Sodium 136 135 - 145 mmol/L   Potassium 5.3 (H) 3.5 - 5.1 mmol/L   Calcium, Ion 1.18 1.15 - 1.40 mmol/L   HCT 34.0 (L) 36.0 - 46.0 %   Hemoglobin 11.6 (L) 12.0 - 15.0 g/dL   Patient temperature 98.6 F    Collection site RADIAL, ALLEN'S TEST ACCEPTABLE    Drawn by RT    Sample type ARTERIAL   CBG monitoring, ED     Status: Abnormal   Collection Time: 08/11/2018  9:39 PM  Result Value Ref Range   Glucose-Capillary 193 (H) 70 - 99 mg/dL  I-stat Creatinine, ED     Status: Abnormal   Collection Time: 07/22/2018  9:44 PM  Result Value Ref Range   Creatinine, Ser 4.30 (H) 0.44 - 1.00 mg/dL  POCT I-Stat EG7     Status: Abnormal   Collection Time: 08/05/2018  9:44 PM  Result Value Ref Range   pH, Ven 7.166 (LL) 7.250 - 7.430   pCO2, Ven 52.1 44.0 - 60.0 mmHg   pO2, Ven 99.0 (H) 32.0 - 45.0 mmHg   Bicarbonate 18.8 (L) 20.0 - 28.0 mmol/L   TCO2  20 (L) 22 - 32 mmol/L   O2 Saturation 95.0 %   Acid-base deficit 10.0 (H) 0.0 - 2.0 mmol/L   Sodium 135 135 - 145 mmol/L   Potassium 5.7 (H) 3.5 - 5.1 mmol/L   Calcium, Ion 1.07 (L) 1.15 - 1.40 mmol/L   HCT 35.0 (L) 36.0 - 46.0 %   Hemoglobin 11.9 (L) 12.0 - 15.0 g/dL   Patient temperature HIDE    Sample type VENOUS    Comment NOTIFIED PHYSICIAN   SARS Coronavirus 2 (CEPHEID- Performed in [facility] hospital lab), Hosp Order     Status: None   Collection Time: 08/14/2018 10:44 PM  Result Value Ref Range   SARS Coronavirus 2 NEGATIVE NEGATIVE    Comment: (NOTE) If result is NEGATIVE SARS-CoV-2 target nucleic acids are NOT DETECTED. The SARS-CoV-2 RNA is generally detectable in upper and lower  respiratory specimens during the acute phase of infection. The lowest  concentration of SARS-CoV-2 viral copies this assay can detect is 250  copies / mL. A negative result does not preclude SARS-CoV-2 infection  and should not be used as the sole basis for treatment or other  patient management decisions.  A negative result may occur with  improper specimen collection / handling, submission of specimen other  than nasopharyngeal swab, presence of viral mutation(s) within the  areas targeted by this assay, and inadequate number of viral copies  (<250 copies / mL). A negative result must be combined with clinical  observations, patient history, and epidemiological information. If result is POSITIVE SARS-CoV-2 target nucleic acids are DETECTED. The SARS-CoV-2 RNA is generally detectable in upper and lower  respiratory specimens dur ing the acute phase of infection.  Positive  results are indicative of active infection with SARS-CoV-2.  Clinical  correlation with patient history and other diagnostic information is  necessary to determine patient infection status.  Positive results do  not rule out bacterial infection or co-infection with other viruses. If result is PRESUMPTIVE  POSTIVE SARS-CoV-2 nucleic acids MAY BE PRESENT.   A presumptive positive result was obtained on the submitted specimen  and confirmed on repeat testing.  While 2019 novel coronavirus  (SARS-CoV-2) nucleic acids may be present in the submitted sample  additional confirmatory testing may be necessary for epidemiological  and / or clinical management purposes  to differentiate between  SARS-CoV-2 and other Sarbecovirus currently known to infect humans.  If clinically indicated additional testing with an alternate test  methodology [phone]) is advised. The SARS-CoV-2 RNA is generally  detectable in upper and lower respiratory sp ecimens during the acute  phase of infection. The expected result is Negative. Fact Sheet for Patients:  StrictlyIdeas.no Fact Sheet for Healthcare Providers: BankingDealers.co.za This test is not yet approved or cleared by the [place] FDA and has been authorized for detection and/or diagnosis of SARS-CoV-2 by FDA under an Emergency Use Authorization (EUA).  This EUA will remain in effect (meaning this test can be used) for the duration of the COVID-19 declaration under Section 564(b)(1) of the Act, 21 U.S.C. section 360bbb-3(b)(1), unless the authorization is terminated or revoked sooner. Performed at [facility], [place] [address], [place], [place] [address]   Lactic acid, plasma     Status: Abnormal   Collection Time: 07/22/2018 10:48 PM  Result Value Ref Range   Lactic Acid, Venous 5.6 (HH) 0.5 - 1.9 mmol/L    Comment: CRITICAL RESULT CALLED TO, READ BACK BY AND VERIFIED WITH: Lenor Coffin [facility] 08/05/2018 2324 WAYK Performed at [facility], [place] [address], [place], [place] [address]   Urinalysis, Routine w reflex microscopic     Status: Abnormal   Collection Time: 07/20/18 12:28 AM  Result Value Ref Range   Color, Urine YELLOW YELLOW   APPearance CLEAR CLEAR   Specific Gravity, Urine 1.010  1.005 - 1.030   pH 6.0 5.0 - 8.0   Glucose, UA 50 (A) NEGATIVE mg/dL   Hgb urine dipstick SMALL (A) NEGATIVE   Bilirubin Urine NEGATIVE NEGATIVE   Ketones, ur NEGATIVE NEGATIVE mg/dL   Protein, ur >=300 (A) NEGATIVE mg/dL   Nitrite NEGATIVE NEGATIVE   Leukocytes,Ua NEGATIVE NEGATIVE   RBC / HPF 0-5 0 - 5 RBC/hpf   WBC, UA 6-10 0 - 5 WBC/hpf   Bacteria, UA RARE (A) NONE SEEN   Squamous Epithelial / LPF 0-5 0 - 5  Comment: Performed at [facility], [place] [address], [place], [place] [address]  MRSA PCR Screening     Status: None   Collection Time: 07/20/18  2:04 AM  Result Value Ref Range   MRSA by PCR NEGATIVE NEGATIVE    Comment:        The GeneXpert MRSA Assay (FDA approved for NASAL specimens only), is one component of a comprehensive MRSA colonization surveillance program. It is not intended to diagnose MRSA infection nor to guide or monitor treatment for MRSA infections. Performed at [facility], [place] [address] [place], [place] [address]   I-STAT 7, (LYTES, BLD GAS, ICA, H+H)     Status: Abnormal   Collection Time: 07/20/18  3:27 AM  Result Value Ref Range   pH, Arterial 7.395 7.350 - 7.450   pCO2 arterial 34.9 32.0 - 48.0 mmHg   pO2, Arterial 421.0 (H) 83.0 - 108.0 mmHg   Bicarbonate 22.2 20.0 - 28.0 mmol/L   TCO2 23 22 - 32 mmol/L   O2 Saturation 100.0 %   Acid-base deficit 3.0 (H) 0.0 - 2.0 mmol/L   Sodium 133 (L) 135 - 145 mmol/L   Potassium 6.0 (H) 3.5 - 5.1 mmol/L   Calcium, Ion 1.43 (H) 1.15 - 1.40 mmol/L   HCT 35.0 (L) 36.0 - 46.0 %   Hemoglobin 11.9 (L) 12.0 - 15.0 g/dL   Patient temperature 33.5 C    Sample type ARTERIAL   Glucose, capillary     Status: Abnormal   Collection Time: 07/20/18  3:40 AM  Result Value Ref Range   Glucose-Capillary 380 (H) 70 - 99 mg/dL  Cooxemetry Panel (carboxy, met, total hgb, O2 sat)     Status: Abnormal   Collection Time: 07/20/18  4:40 AM  Result Value Ref Range   Total hemoglobin 11.4 (L) 12.0 - 16.0  g/dL   O2 Saturation 76.1 %   Carboxyhemoglobin 1.3 0.5 - 1.5 %   Methemoglobin 1.5 0.0 - 1.5 %  CBC     Status: Abnormal   Collection Time: 07/20/18  4:46 AM  Result Value Ref Range   WBC 14.0 (H) 4.0 - 10.5 K/uL   RBC 3.81 (L) 3.87 - 5.11 MIL/uL   Hemoglobin 10.7 (L) 12.0 - 15.0 g/dL   HCT 34.6 (L) 36.0 - 46.0 %   MCV 90.8 80.0 - 100.0 fL   MCH 28.1 26.0 - 34.0 pg   MCHC 30.9 30.0 - 36.0 g/dL   RDW 14.2 11.5 - 15.5 %   Platelets 208 150 - 400 K/uL   nRBC 0.0 0.0 - 0.2 %    Comment: Performed at [facility], [place] [address], [place], [place] [address]  Triglycerides     Status: None   Collection Time: 07/20/18  4:46 AM  Result Value Ref Range   Triglycerides 54 <150 mg/dL    Comment: Performed at [facility] [address] [place], [place] [address]  Comprehensive metabolic panel     Status: Abnormal   Collection Time: 07/20/18  4:46 AM  Result Value Ref Range   Sodium 135 135 - 145 mmol/L   Potassium 5.7 (H) 3.5 - 5.1 mmol/L   Chloride 106 98 - 111 mmol/L   CO2 17 (L) 22 - 32 mmol/L   Glucose, Bld 476 (H) 70 - 99 mg/dL   BUN 57 (H) 8 - 23 mg/dL   Creatinine, Ser 4.20 (H) 0.44 - 1.00 mg/dL   Calcium 9.9 8.9 -  10.3 mg/dL   Total Protein 5.1 (L) 6.5 - 8.1 g/dL   Albumin 2.4 (L) 3.5 - 5.0 g/dL   AST 412 (H) 15 - 41 U/L   ALT 222 (H) 0 - 44 U/L   Alkaline Phosphatase 87 38 - 126 U/L   Total Bilirubin 1.0 0.3 - 1.2 mg/dL   GFR calc non Af Amer 10 (L) >60 mL/min   GFR calc Af Amer 12 (L) >60 mL/min   Anion gap 12 5 - 15    Comment: Performed at [facility] [address] [place], [place] [address]  Magnesium     Status: None   Collection Time: 07/20/18  4:46 AM  Result Value Ref Range   Magnesium 2.4 1.7 - 2.4 mg/dL    Comment: Performed at [facility] [address] [place], [place] [address]  Phosphorus     Status: Abnormal   Collection Time: 07/20/18  4:46 AM  Result Value Ref Range   Phosphorus 5.3 (H) 2.5 - 4.6 mg/dL    Comment:  Performed at [facility] [address], [place], [place] [address]  Troponin I -     Status: Abnormal   Collection Time: 07/20/18  4:46 AM  Result Value Ref Range   Troponin I 2.07 (HH) <0.03 ng/mL    Comment: DELTA CHECK NOTED CRITICAL RESULT CALLED TO, READ BACK BY AND VERIFIED WITHArloa Koh RN [phone] [id number] BY A BENNETT Performed at [facility], [place] [address] [place], [place] [address]   Urine rapid drug screen (hosp performed)     Status: Abnormal   Collection Time: 07/20/18  4:59 AM  Result Value Ref Range   Opiates NONE DETECTED NONE DETECTED   Cocaine NONE DETECTED NONE DETECTED   Benzodiazepines POSITIVE (A) NONE DETECTED   Amphetamines NONE DETECTED NONE DETECTED   Tetrahydrocannabinol NONE DETECTED NONE DETECTED   Barbiturates NONE DETECTED NONE DETECTED    Comment: (NOTE) DRUG SCREEN FOR MEDICAL PURPOSES ONLY.  IF CONFIRMATION IS NEEDED FOR ANY PURPOSE, NOTIFY LAB WITHIN 5 DAYS. LOWEST DETECTABLE LIMITS FOR URINE DRUG SCREEN Drug Class                     Cutoff (ng/mL) Amphetamine and metabolites    1000 Barbiturate and metabolites    200 Benzodiazepine                 480 Tricyclics and metabolites     300 Opiates and metabolites        300 Cocaine and metabolites        300 THC                            50 Performed at [facility], [place] [address], [place], [place] [address]   HIV Antibody (routine testing w rflx)     Status: None   Collection Time: 07/20/18  6:30 AM  Result Value Ref Range   HIV Screen 4th Generation wRfx Non Reactive Non Reactive    Comment: (NOTE) Performed At: [facility] [address] [place], [place] [id number] Rush Farmer MD LJ:[id number]   Glucose, capillary     Status: Abnormal   Collection Time: 07/20/18  7:43 AM  Result Value Ref Range   Glucose-Capillary 411 (H) 70 - 99 mg/dL  Glucose, capillary     Status: Abnormal   Collection Time: 07/20/18  8:07  AM  Result Value Ref Range    Glucose-Capillary 442 (H) 70 - 99 mg/dL  Glucose, capillary     Status: Abnormal   Collection Time: 07/20/18  9:48 AM  Result Value Ref Range   Glucose-Capillary 406 (H) 70 - 99 mg/dL  Lactic acid, plasma     Status: Abnormal   Collection Time: 07/20/18  9:51 AM  Result Value Ref Range   Lactic Acid, Venous 2.3 (HH) 0.5 - 1.9 mmol/L    Comment: CRITICAL RESULT CALLED TO, READ BACK BY AND VERIFIED WITHCeline Mans RN [phone] BY A BENNETT Performed at [facility], [place] [address], [place], [place] [address]   Troponin I - Now Then Q6H     Status: Abnormal   Collection Time: 07/20/18 10:48 AM  Result Value Ref Range   Troponin I 2.11 (HH) <0.03 ng/mL    Comment: CRITICAL VALUE NOTED.  VALUE IS CONSISTENT WITH PREVIOUSLY REPORTED AND CALLED VALUE. Performed at [facility], [place] [address] [place], [place] [address]   Glucose, capillary     Status: Abnormal   Collection Time: 07/20/18 11:19 AM  Result Value Ref Range   Glucose-Capillary 325 (H) 70 - 99 mg/dL  Heparin level (unfractionated)     Status: None   Collection Time: 07/20/18 12:00 PM  Result Value Ref Range   Heparin Unfractionated 0.42 0.30 - 0.70 IU/mL    Comment: (NOTE) If heparin results are below expected values, and patient dosage has  been confirmed, suggest follow up testing of antithrombin III levels. Performed at [facility], [place] [address] [place], [place] [address]   Lactic acid, plasma     Status: Abnormal   Collection Time: 07/20/18 12:51 PM  Result Value Ref Range   Lactic Acid, Venous 2.4 (HH) 0.5 - 1.9 mmol/L    Comment: CRITICAL RESULT CALLED TO, READ BACK BY AND VERIFIED WITH: Celine Mans RN [id number] BY A BENNETT Performed at [facility], [place] [address] [place] [address], [place], [place] [address]      ROS: Unable to obtain.   Physical Exam: Vitals:   07/20/18 1430 07/20/18 1512  BP:  (!) 140/46  Pulse: 66 70  Resp: (!) 28 (!) 25  Temp: (!) 101.5 F (38.6 C)   SpO2: 94% 98%      General exam: Ill-looking female, sedated and intubated Respiratory system: Coarse breath sound bilateral, no wheezing  cardiovascular system: S1 & S2 heard, RRR.  No pedal edema. Gastrointestinal system: Abdomen is nondistended, soft  Central nervous system: Unresponsive Extremities: Right BKA Skin: No rashes, lesions or ulcers Psychiatry: Unable to examine  Assessment/Plan:  #Possibly acute kidney injury on CKD likely ischemic ATN due to cardiogenic shock/cardiac arrest: Patient is anuric with hyperkalemia and mild acidosis.  She likely has multiorgan failure including encephalopathy, renal failure due to cardiac arrest.  I had a lengthy goals of care discussion with the patient's daughter today  She would like to proceed with any measures even if this is temporizing method at this time.  We will do trial of CRRT for 24 to 48 hours.  I do not think patient is candidate for outpatient dialysis.  I have discussed with ICU team, needs temp HD cath.  Ordered ultrasound kidney to rule out any obstruction. -IV heparin for anticoagulation, no net UF, all 4K. -Recommend palliative care consult.  #Cardiogenic shock/cardiac arrest: On epinephrine.  Arterial blood pressure acceptable at this time.  Cardiology following.  #Myoclonic  jerk/possible anoxic brain injury: Neurology consult requested.  #Hyperkalemia: Starting CRRT.  Monitor labs.  #Lactic acidosis.  Thank you for the consult.  We will follow with you.   Analeigh Aries Tanna Furry 07/20/2018, 3:15 PM  [employer].

## 2018-07-20 NOTE — Progress Notes (Addendum)
I had a long discussion with the patient's daughter Sherrine Maples over the phone. I explained to her that she likely had cardiac arrest due to a combination of possible ACS, as well as severe aortic stenosis. Unfortunately, she has multiorgan failure from underlying comorbidities, as well as the cardiac arrest. She likely has severe anoxic brain injury. While she understands the prognosis, she requested to continue "everything that can be done" at this time. However, in my opinion, any cardiac procedures will be futile given her poor neurological and overall prognosis and I do not recommend them. At her request. I sought second opinion by [facility] team. Dr. Burt Knack kindly agreed to see the patient.   Nigel Mormon, MD [facility] Pager: [phone] Office: [phone] If no answer Cell [phone]

## 2018-07-20 NOTE — ED Notes (Signed)
Levophed increased to 74mcg d/t BP 60/51

## 2018-07-20 NOTE — Progress Notes (Addendum)
CRITICAL VALUE ALERT  Critical Value:  BGL 33 (taken from art line)  Date & Time Notied:  n/a  Provider Notified: Will speak w/ Elink MD when he cameras in about possibly needing a dextrose gtt  Orders Received/Actions taken: Given 1amp D50 per protocol. Will recheck BGL in 66min **Recheck @2015  showed BGL of 125

## 2018-07-20 NOTE — ED Notes (Signed)
PCXR done

## 2018-07-20 NOTE — Progress Notes (Signed)
eLink Physician-Brief Progress Note Patient Name: Rebecca Benton DOB: August 24, 1950 MRN: [id number]   Date of Service  07/20/2018  HPI/Events of Note  68 year old female with a history of CKD presented to the ER with PEA cardiac arrest.  In the ER patient developed ventricular fibrillation for which shock was delivered.   eICU Interventions  Currently on epinephrine and Levophed.  Notified bedside ICU team.  Recommend hypothermia protocol.  Cardiology thinks risk of invasive coronary angiography outweighs minimal benefits.      Intervention Category Major Interventions: Shock - evaluation and management;Code management / supervision;Respiratory failure - evaluation and management Intermediate Interventions: Communication with other healthcare providers and/or family Evaluation Type: New Patient Evaluation  Mady Gemma 07/20/2018, 1:33 AM

## 2018-07-20 NOTE — ED Notes (Signed)
Epi gtt increased to 65mcg d/t BP 33/19, pharmacy bringing Levophed to bedside. MD aware

## 2018-07-20 NOTE — Progress Notes (Signed)
   07/20/18 0900  Clinical Encounter Type  Visited With Health care provider  Visit Type Initial;ED;Code  Referral From Other (Comment) (STEMI pg)   Called and then responded in person to STEMI, subsequently cancelled.  [place], [phone]

## 2018-07-20 NOTE — ED Notes (Signed)
Pt sedated and paralyzed, king airway removed, pt intubated easily by MD.

## 2018-07-20 NOTE — Procedures (Signed)
Hemodialysis Catheter Insertion Procedure Note Bridie Colquhoun [id number] 11/25/1950  Procedure: Insertion of Hemodialysis Catheter Indications: Dialysis Access   Procedure Details Consent: Risks of procedure as well as the alternatives and risks of each were explained to the (patient/caregiver).  Consent for procedure obtained. Time Out: Verified patient identification, verified procedure, site/side was marked, verified correct patient position, special equipment/implants available, medications/allergies/relevent history reviewed, required imaging and test results available.  Performed Real time [place] used to ID and cannulate vessel  Maximum sterile technique was used including antiseptics, cap, gloves, gown, hand hygiene, mask and sheet. Skin prep: Chlorhexidine; local anesthetic administered Triple lumen hemodialysis catheter was inserted into right internal jugular vein using the Seldinger technique.  Evaluation Blood flow good Complications: No apparent complications Patient did tolerate procedure well. Chest X-ray ordered to verify placement.  CXR: pending.  Erick Colace ACNP-BC [place] Pager # [phone] OR # [phone] if no answer  Clementeen Graham 07/20/2018

## 2018-07-20 NOTE — Procedures (Signed)
Central Venous Catheter Insertion Procedure Note Rebecca Benton [id number] [birth year]  Procedure: Insertion of Central Venous Catheter Indications: Assessment of intravascular volume, Drug and/or fluid administration and Frequent blood sampling  Procedure Details Consent: Risks of procedure as well as the alternatives and risks of each were explained to the (patient/caregiver).  Consent for procedure obtained. Time Out: Verified patient identification, verified procedure, site/side was marked, verified correct patient position, special equipment/implants available, medications/allergies/relevent history reviewed, required imaging and test results available.  Performed  Maximum sterile technique was used including antiseptics, cap, gloves, gown, hand hygiene, mask and sheet. Skin prep: Chlorhexidine; local anesthetic administered A antimicrobial bonded/coated triple lumen catheter was placed in the left internal jugular vein using the Seldinger technique to 17 cm.  Line sutured.  Biopatch and sterile dressing applied.    Evaluation Blood flow good Complications: No apparent complications Patient did tolerate procedure well. Chest X-ray ordered to verify placement.  CXR: pending.  Procedure performed with ultrasound guidance for real time vessel cannulation.     Kennieth Rad, MSN, AGACNP-BC [facility] Pgr: [phone] or if no answer [phone] 07/20/2018, 3:28 AM

## 2018-07-20 NOTE — Progress Notes (Signed)
NAME:  Rebecca Benton, MRN:  [id number], DOB:  [birth year], LOS: 1 ADMISSION DATE:  07/21/2018, CONSULTATION DATE:  07/17/2018 REFERRING MD:  Dr. Regenia Skeeter, CHIEF COMPLAINT:  Cardiac arrest   Brief History   68 year old female presenting from home with witnessed prolonged cardiac arrest. Found in PEA by EMS. In the ED, had second PEA arrest. Likely primary cardiac event, however given her poor neurological function, she is not a candidate for intervention. Remains on multiple pressors.   Past Medical History  COPD, CKD stage 4, DM, CAD s/p CABG 2016, HTN, stroke, R BKA 2017  Significant Hospital Events   5/3 Admit/ cardiac arrest   Consults:  Cardiology  Procedures:  5/3 ETT >>  Significant Diagnostic Tests:  5/4 [facility] >> Old left frontal infarct. No intracranial abnormality  Micro Data:  5/3 BCx 2 >>  5/3 UC >> 5/3 trach asp >> 5/3 COVID >> neg  Antimicrobials:   Interim history/subjective:  Intubated. Off continuous sedation. Remains on Epi gtt. Myoclonus on exam  Objective   Blood pressure (!) 124/57, pulse (!) 56, temperature (!) 95.2 F (35.1 C), resp. rate 19, height 4\' 11"  (1.499 m), weight 56.8 kg, SpO2 95 %. CVP:  [0 mmHg-9 mmHg] 0 mmHg  Vent Mode: PRVC FiO2 (%):  [40 %-100 %] 40 % Set Rate:  [14 bmp-22 bmp] 22 bmp Vt Set:  [340 mL-500 mL] 340 mL PEEP:  [5 cmH20-10 cmH20] 5 cmH20 Plateau Pressure:  [14 cmH20-20 cmH20] 14 cmH20   Intake/Output Summary (Last 24 hours) at 07/20/2018 0932 Last data filed at 07/20/2018 0900 Gross per 24 hour  Intake 1253.15 ml  Output 150 ml  Net 1103.15 ml   Filed Weights   08/07/2018 2057 07/20/18 0215  Weight: 56.7 kg 56.8 kg    Physical Exam: General: Critically ill-appearing, intubated, intermittent myoclonus HENT: [place], AT, ETT in place Eyes: EOMI, no scleral icterus Respiratory: Clear to auscultation bilaterally.  No crackles, wheezing or rales Cardiovascular: Bradycardic, -M/R/G, no JVD GI: BS+, soft, nontender  Extremities:-Edema,-tenderness Neuro: Unresponsive, intermittent full body myoclonus, pupils 22mm, sluggishly reactive, no gag/corneal reflext  CXR 5/4 : No pulmonary infiltrate, effusion or edema.  ETT in place. L IJ CVC. S/p sternotomy  Resolved Hospital Problem list    Assessment & Plan:  68 year old female with past history of COPD, CKD stage 4, DM, CAD s/p CABG 2016, HTN, stroke, and R BKA 2017 presenting from home with cardiac arrest.    Acute encephalopathy Myoclonus Concerned for anoxic brain injury. No PRN sedation given  CT with NAICA Precludes ability to liberate from ventilator P: Neuro checks EEG pending PRN sedation Consider AED for myoclonus  Acute respiratory failure COPD On minimal vent settings. Unable to extubate d/t neuro status P:  Full vent support VAP bundle Duonebs q6 prn   Cardiac arrest Cardiogenic shock  Initial prolonged PEA/ then VT. Likely cardiac etiology Unfortunately, due to her poor neurologic exam and prolonged arrest is not a candidate for interventional workup  P:  Defer TTM due to prolonged arrest Telemetry Cardiology following Off levophed. Wean epi for goal MAP > 65 Amio gtt Heparin gtt Daily ASA Echocardiogram Trend troponin and lactate Goal K >4 and Mg >2  AKI- previous sCr 3.55 Hyperkalemia  AGMA/ lactic acid  P:  Foley in place Continue bicarb gtt  Trend BMP / urinary output Replace electrolytes as indicated Avoid nephrotoxic agents, ensure adequate renal perfusion  Transaminitis Likely shock liver P:  Trend LFTs/ coags  DM Hyperglycemia P:  Change to SSI to resistant scare CBG q 4  Best practice:  Diet: NPO Pain/Anxiety/Delirium protocol (if indicated): not needed at this time VAP protocol (if indicated): yes DVT prophylaxis: heparin gtt GI prophylaxis: PPI Glucose control: SSI  Mobility: BR Code Status: Full Code Family Communication: Daughter Jolanda [phone]), 2nd contact/family Modena Slater  [phone].  Jolanda ask that no information be given out other than these 2 listed.  Disposition: ICU  Labs   CBC: Recent Labs  Lab 08/02/2018 2131 07/25/2018 2135 07/29/2018 2144 07/20/18 0327 07/20/18 0446  WBC 16.3*  --   --   --  14.0*  NEUTROABS 9.4*  --   --   --   --   HGB 11.2* 11.6* 11.9* 11.9* 10.7*  HCT 38.5 34.0* 35.0* 35.0* 34.6*  MCV 96.0  --   --   --  90.8  PLT 196  --   --   --  431    Basic Metabolic Panel: Recent Labs  Lab 08/05/2018 2131 08/16/2018 2135 08/05/2018 2144 07/20/18 0327 07/20/18 0446  NA 136 136 135 133* 135  K 5.8* 5.3* 5.7* 6.0* 5.7*  CL 105  --   --   --  106  CO2 14*  --   --   --  17*  GLUCOSE 262*  --   --   --  476*  BUN 51*  --   --   --  57*  CREATININE 4.41*  --  4.30*  --  4.20*  CALCIUM 8.5*  --   --   --  9.9  MG  --   --   --   --  2.4  PHOS  --   --   --   --  5.3*   GFR: Estimated Creatinine Clearance: 9.7 mL/min (A) (by C-G formula based on SCr of 4.2 mg/dL (H)). Recent Labs  Lab 08/10/2018 2131 07/25/2018 2248 07/20/18 0446  WBC 16.3*  --  14.0*  LATICACIDVEN  --  5.6*  --     Liver Function Tests: Recent Labs  Lab 08/14/2018 2131 07/20/18 0446  AST 102* 412*  ALT 82* 222*  ALKPHOS 100 87  BILITOT 0.6 1.0  PROT 6.0* 5.1*  ALBUMIN 2.8* 2.4*   No results for input(s): LIPASE, AMYLASE in the last 168 hours. No results for input(s): AMMONIA in the last 168 hours.  ABG    Component Value Date/Time   PHART 7.395 07/20/2018 0327   PCO2ART 34.9 07/20/2018 0327   PO2ART 421.0 (H) 07/20/2018 0327   HCO3 22.2 07/20/2018 0327   TCO2 23 07/20/2018 0327   ACIDBASEDEF 3.0 (H) 07/20/2018 0327   O2SAT 76.1 07/20/2018 0440     Coagulation Profile: Recent Labs  Lab 08/15/2018 2131  INR 1.2    Cardiac Enzymes: Recent Labs  Lab 07/28/2018 2131 07/20/18 0446  TROPONINI 0.03* 2.07*    HbA1C: No results found for: HGBA1C  CBG: Recent Labs  Lab 07/27/2018 2139 07/20/18 0340 07/20/18 0743 07/20/18 0807  GLUCAP  193* 380* 411* 442*    Critical care time: 45 min    The patient is critically ill with multiple organ systems failure and requires high complexity decision making for assessment and support, frequent evaluation and titration of therapies, application of advanced monitoring technologies and extensive interpretation of multiple databases.   Critical Care Time devoted to patient care services described in this note is 45 Minutes. This time reflects time of care of this signee Dr. Opal Sidles  Loanne Drilling. This critical care time does not reflect procedure time, or teaching time or supervisory time of PA/NP/Med student/Med Resident etc but could involve care discussion time.  Rodman Pickle, M.D. [facility] Pulmonary/Critical Care Medicine Pager: [phone] After hours pager: [phone]

## 2018-07-20 NOTE — Consult Note (Signed)
Cardiology Consultation:   Patient ID: Rebecca Benton MRN: [id number]; DOB: 10-Aug-1950  Admit date: 08/04/2018 Date of Consult: 07/20/2018  Primary Care Provider: Lin Landsman, MD Primary Cardiologist: No primary care provider on file.  Primary Electrophysiologist:  None    Patient Profile:   Rebecca Benton is a 68 y.o. female with a hx of coronary artery disease status post CABG in 2016 who is being seen today for the evaluation of out of hospital cardiac arrest at the request of Dr. Virgina Jock (family request second opinion).  History of Present Illness:   Rebecca Benton has background medical problems of coronary artery disease with previous coronary bypass surgery, COPD, type 2 diabetes, right BKA, and hypertension.  The patient presented with out of hospital cardiac arrest yesterday.  Arrest was witnessed by Rebecca Benton who I personally interviewed over the telephone and she is the primary source of history.  The patient called out for help and when the Benton came in to see Rebecca, she was lying on the floor unconscious.  The Benton called EMS immediately and did CPR.  The patient reportedly had PEA arrest with about 10 minutes of CPR by EMS.  ROSC was achieved and the patient arrested a second time in the emergency department with ventricular fibrillation followed by PEA.  She again was treated with ACLS measures, defibrillation, and epinephrine.  The patient had transient ST elevation noted but ST segments returned to baseline over time.  The patient has been comatose and unresponsive since Rebecca arrest.  Pertinent labs demonstrate findings consistent with acute renal failure, creatinine currently 4.2, troponin is 2.1, lactate was 5.6 now trending down at 2.4.  Past Medical History:  Diagnosis Date  . CKD (chronic kidney disease) stage 4, GFR 15-29 ml/min (HCC)   . COPD (chronic obstructive pulmonary disease) ([place])   . Diabetes ([place])   . Hx of CABG   . Hypertension   . Stroke [facility])     Past  Surgical History:  Procedure Laterality Date  . BELOW KNEE LEG AMPUTATION    . CORONARY ARTERY BYPASS GRAFT    . TUBAL LIGATION       Home Medications:  Prior to Admission medications   Not on File    Inpatient Medications: Scheduled Meds: . amiodarone  150 mg Intravenous Once  . aspirin  300 mg Rectal Daily  . chlorhexidine gluconate (MEDLINE KIT)  15 mL Mouth Rinse BID  . insulin aspart  0-20 Units Subcutaneous Q4H  . mouth rinse  15 mL Mouth Rinse 10 times per day  . pantoprazole (PROTONIX) IV  40 mg Intravenous Daily   Continuous Infusions: . amiodarone 30 mg/hr (07/20/18 1300)  . epinephrine 8 mcg/min (07/20/18 1422)  . heparin 700 Units/hr (07/20/18 1300)  . levETIRAcetam    . levETIRAcetam    .  sodium bicarbonate  infusion 1000 mL 40 mL/hr at 07/20/18 1300   PRN Meds: hydrALAZINE, ipratropium-albuterol  Allergies:    Allergies  Allergen Reactions  . Penicillins   . Tape     Social History:   Social History   Socioeconomic History  . Marital status: Widowed    Spouse name: Not on file  . Number of children: Not on file  . Years of education: Not on file  . Highest education level: Not on file  Occupational History  . Not on file  Social Needs  . Financial resource strain: Not on file  . Food insecurity:    Worry: Not on file  Inability: Not on file  . Transportation needs:    Medical: Not on file    Non-medical: Not on file  Tobacco Use  . Smoking status: Former Research scientist (life sciences)  . Smokeless tobacco: Never Used  Substance and Sexual Activity  . Alcohol use: Not Currently  . Drug use: Not Currently  . Sexual activity: Not on file  Lifestyle  . Physical activity:    Days per week: Not on file    Minutes per session: Not on file  . Stress: Not on file  Relationships  . Social connections:    Talks on phone: Not on file    Gets together: Not on file    Attends religious service: Not on file    Active member of club or organization: Not on file     Attends meetings of clubs or organizations: Not on file    Relationship status: Not on file  . Intimate partner violence:    Fear of current or ex partner: Not on file    Emotionally abused: Not on file    Physically abused: Not on file    Forced sexual activity: Not on file  Other Topics Concern  . Not on file  Social History Narrative  . Not on file    Family History:   Family History  Problem Relation Age of Onset  . Heart disease Mother   . Diabetes Mother   . Heart disease Father   . Diabetes Father      ROS:  Please see the history of present illness.  Otherwise unable to obtain because of patient's condition  Physical Exam/Data:   Vitals:   07/20/18 1124 07/20/18 1230 07/20/18 1300 07/20/18 1330  BP: (!) 150/43 107/77 (!) 110/37 115/76  Pulse: 63 68 69 (!) 58  Resp: (!) 29 (!) 33 (!) 22 (!) 21  Temp:  (!) 101.3 F (38.5 C) (!) 101.7 F (38.7 C) (!) 101.3 F (38.5 C)  TempSrc:      SpO2: 95% 92% (!) 85% 97%  Weight:      Height:        Intake/Output Summary (Last 24 hours) at 07/20/2018 1425 Last data filed at 07/20/2018 1352 Gross per 24 hour  Intake 2229.67 ml  Output 210 ml  Net 2019.67 ml   Last 3 Weights 07/20/2018 08/11/2018  Weight (lbs) 125 lb 3.5 oz 125 lb  Weight (kg) 56.8 kg 56.7 kg     Body mass index is 25.29 kg/m.  General: Intubated, unresponsive HEENT: normal Lymph: no adenopathy Neck: no JVD Endocrine:  No thryomegaly Vascular: Bilateral carotid bruits; FA pulses 2+ bilaterally Cardiac:  normal S1, S2; RRR; distant heart sounds but 3/6 harsh systolic murmur appreciable at the right upper sternal border Lungs:  clear to auscultation bilaterally, no wheezing, rhonchi or rales  Abd: soft, nontender, no hepatomegaly  Ext: no edema, BKA noted Musculoskeletal:  No deformities Skin: warm and dry  Neuro: Unresponsive, pupils fixed, myoclonic jerking noted  EKG:  The EKG was personally reviewed and demonstrates: Marked sinus bradycardia 45  bpm with occasional PVCs  Telemetry:  Telemetry was personally reviewed and demonstrates: Sinus rhythm/sinus bradycardia  Relevant CV Studies: 2D echo: IMPRESSIONS    1. The left ventricle has moderately reduced systolic function, with an ejection fraction of 35-40%. The cavity size was normal. There is moderately increased left ventricular wall thickness. Left ventricular diastolic Doppler parameters are consistent  with impaired relaxation. Normal size right ventricle with mildly reduced systolic function.  2.  Left atrial size was mildly dilated.  3. Mean PG 30 mmHg, Vmax 3.3 m/sec, AVA 0.7 cm2. Likely low flow low gradient severe aortic stenosis.  4. The mitral valve is degenerative. Moderate calcification of the mitral valve leaflet.  5. Tricuspid valve regurgitation is mild-moderate. Peak RA-RV gradient 27 mmHg.  6. Trivial pericardial effusion is present.  7. The interatrial septum was not assessed.   Laboratory Data:  Chemistry Recent Labs  Lab 07/22/2018 2131  07/28/2018 2144 07/20/18 0327 07/20/18 0446  NA 136   < > 135 133* 135  K 5.8*   < > 5.7* 6.0* 5.7*  CL 105  --   --   --  106  CO2 14*  --   --   --  17*  GLUCOSE 262*  --   --   --  476*  BUN 51*  --   --   --  57*  CREATININE 4.41*  --  4.30*  --  4.20*  CALCIUM 8.5*  --   --   --  9.9  GFRNONAA 10*  --   --   --  10*  GFRAA 11*  --   --   --  12*  ANIONGAP 17*  --   --   --  12   < > = values in this interval not displayed.    Recent Labs  Lab 07/20/2018 2131 07/20/18 0446  PROT 6.0* 5.1*  ALBUMIN 2.8* 2.4*  AST 102* 412*  ALT 82* 222*  ALKPHOS 100 87  BILITOT 0.6 1.0   Hematology Recent Labs  Lab 07/31/2018 2131  07/30/2018 2144 07/20/18 0327 07/20/18 0446  WBC 16.3*  --   --   --  14.0*  RBC 4.01  --   --   --  3.81*  HGB 11.2*   < > 11.9* 11.9* 10.7*  HCT 38.5   < > 35.0* 35.0* 34.6*  MCV 96.0  --   --   --  90.8  MCH 27.9  --   --   --  28.1  MCHC 29.1*  --   --   --  30.9  RDW 14.0  --    --   --  14.2  PLT 196  --   --   --  208   < > = values in this interval not displayed.   Cardiac Enzymes Recent Labs  Lab 07/28/2018 2131 07/20/18 0446 07/20/18 1048  TROPONINI 0.03* 2.07* 2.11*   No results for input(s): TROPIPOC in the last 168 hours.  BNPNo results for input(s): BNP, PROBNP in the last 168 hours.  DDimer No results for input(s): DDIMER in the last 168 hours.  Radiology/Studies:  Ct Head Wo Contrast  Result Date: 07/20/2018 CLINICAL DATA:  Witnessed arrest EXAM: CT HEAD WITHOUT CONTRAST TECHNIQUE: Contiguous axial images were obtained from the base of the skull through the vertex without intravenous contrast. COMPARISON:  None. FINDINGS: Brain: Evidence of old infarct and encephalomalacia within the left frontal lobe. Chronic small vessel disease throughout the deep white matter. No acute intracranial abnormality. Specifically, no hemorrhage, hydrocephalus, mass lesion, acute infarction, or significant intracranial injury. Vascular: No hyperdense vessel or unexpected calcification. Skull: No acute calvarial abnormality. Sinuses/Orbits: Coastal thickening throughout the paranasal sinuses. Other: None IMPRESSION: Old left frontal infarct. No acute intracranial abnormality. Chronic sinusitis. Electronically Signed   By: Rolm Baptise M.D.   On: 07/20/2018 02:08   Dg Chest Port 1 View  Result Date: 07/20/2018 CLINICAL DATA:  68 y/o  F; central line placement. EXAM: PORTABLE CHEST 1 VIEW COMPARISON:  08/09/2018 chest radiograph FINDINGS: Endotracheal tube 3.1 cm above the carina. Enteric tube tip extends below the field of view into the abdomen. Left central venous catheter tip projects over the upper SVC. No pneumothorax. Stable cardiomediastinal silhouette. Post median sternotomy with wires in alignment. No new consolidation, effusion, or pneumothorax. No acute osseous abnormality is evident. IMPRESSION: Left central venous catheter tip projects over upper SVC. No acute pulmonary  process identified. Electronically Signed   By: Kristine Garbe M.D.   On: 07/20/2018 03:10   Dg Chest Portable 1 View  Result Date: 07/23/2018 CLINICAL DATA:  68 y/o F; patient coded, respiratory distress, intubation. EXAM: PORTABLE CHEST 1 VIEW COMPARISON:  None. FINDINGS: Normal cardiac silhouette given projection and technique. Post median sternotomy with wires intact. Endotracheal tube tip 3.4 cm above the carina. Enteric tube tip extends below the field of view into the abdomen. Aortic atherosclerosis with calcification. Clear lungs. No pleural effusion or pneumothorax. No acute osseous abnormality is evident. IMPRESSION: 1. Endotracheal tube tip 3.4 cm above the carina. Enteric tube tip extends below the field of view into the abdomen. 2. No acute pulmonary process identified. Electronically Signed   By: Kristine Garbe M.D.   On: 08/09/2018 22:25    Assessment and Plan:   1. Out of hospital PEA arrest 2. Troponin elevation suspect demand ischemia, possible ACS 3. Ventricular fibrillation, maintaining sinus rhythm on amiodarone 4. Acute oliguric renal failure 5. Anoxic encephalopathy  All data has been reviewed.  I have reviewed the clinical notes from cardiology and CCM.  The patient has very poor prognostic signs after sustaining out of hospital cardiac arrest.  She now has multiorgan failure with what appears to be severe anoxic encephalopathy.  I had a lengthy discussion with Rebecca Benton who wishes for continued aggressive care as possible.  She understands that Rebecca prognosis of any meaningful recovery is very poor, but wishes to at least allow other family members to be able to come back into town and see the patient.  She also remains hopeful for a miracle.  She has requested neurology and nephrology consultations which are being requested by Dr. Loanne Drilling.  I reiterated that I would not recommend taking Rebecca to the cardiac catheterization lab as I think the risk far  outweighs any potential benefit and I agree with conservative care from a cardiac perspective moving forward.  She was appreciative of the evaluation.  [facility] will sign off.   Medication Recommendations:  Continued care per Dr Virgina Jock Other recommendations (labs, testing, etc):  none  For questions or updates, please contact [facility] Please consult [email or web address] for contact info under     Signed, Sherren Mocha, MD  07/20/2018 2:25 PM

## 2018-07-20 NOTE — Progress Notes (Addendum)
Interval PCCM Note   Extensive goals of care conversation held with daughter, Sherrine Maples. She expresses disappointment in Cardiology in not offerring intervention for her mother. We discussed patient's grim prognosis including concern for possible anoxic brain injury as a result of cardiac arrest. Despite this, daughter feels her mother has overcome prior medical issues in the past and that she will be able to do it again. Jolanda asked about dialysis for her mother's kidney failure. I expressed that aggressive treatment would not likely help her neurologic status. She wishes to be aggressive and would like to pursue this option. I informed her I will consult Neuro and Renal and provide any updates in the interim.  CC Time: 96 min  Rodman Pickle, M.D. [facility] Pulmonary/Critical Care Medicine Pager: [phone] After hours pager: [phone]

## 2018-07-20 NOTE — Progress Notes (Signed)
ANTICOAGULATION CONSULT NOTE - Initial Consult  Pharmacy Consult for heparin Indication: chest pain/ACS  Allergies  Allergen Reactions  . Penicillins   . Tape     Patient Measurements: Height: '4\' 11"'$  (149.9 cm) Weight: 125 lb 3.5 oz (56.8 kg) IBW/kg (Calculated) : 43.2  Vital Signs: Temp: 101.5 F (38.6 C) (05/04 1430) Temp Source: Core (05/04 1121) BP: 75/58 (05/04 1400) Pulse Rate: 66 (05/04 1430)  Labs: Recent Labs    08/13/2018 2131  08/02/2018 2144 07/20/18 0327 07/20/18 0446 07/20/18 1048 07/20/18 1200  HGB 11.2*   < > 11.9* 11.9* 10.7*  --   --   HCT 38.5   < > 35.0* 35.0* 34.6*  --   --   PLT 196  --   --   --  208  --   --   LABPROT 15.1  --   --   --   --   --   --   INR 1.2  --   --   --   --   --   --   HEPARINUNFRC  --   --   --   --   --   --  0.42  CREATININE 4.41*  --  4.30*  --  4.20*  --   --   TROPONINI 0.03*  --   --   --  2.07* 2.11*  --    < > = values in this interval not displayed.    Estimated Creatinine Clearance: 9.7 mL/min (A) (by C-G formula based on SCr of 4.2 mg/dL (H)).   Medical History: Past Medical History:  Diagnosis Date  . CKD (chronic kidney disease) stage 4, GFR 15-29 ml/min (HCC)   . COPD (chronic obstructive pulmonary disease) ([place])   . Diabetes ([place])   . Hx of CABG   . Hypertension   . Stroke [facility])     Medications:  No medications prior to admission.   Scheduled:  . amiodarone  150 mg Intravenous Once  . aspirin  300 mg Rectal Daily  . chlorhexidine gluconate (MEDLINE KIT)  15 mL Mouth Rinse BID  . insulin aspart  0-20 Units Subcutaneous Q4H  . mouth rinse  15 mL Mouth Rinse 10 times per day  . pantoprazole (PROTONIX) IV  40 mg Intravenous Daily   Infusions:  . amiodarone 30 mg/hr (07/20/18 1400)  . epinephrine 8 mcg/min (07/20/18 1422)  . heparin 700 Units/hr (07/20/18 1400)  . levETIRAcetam    .  sodium bicarbonate  infusion 1000 mL 40 mL/hr at 07/20/18 1400    Assessment: 68yo female presented as  code STEMI after PEA/Vfib arrests, was not brought to cath lab given risks > benefit, now to start heparin.  Heparin level therapeutic at 0.42 on 700 units/hr. Hgb trending down to 10.7, plts ok, CKD stage 4, no reports of infusion related issues or bleeding per RN.   Goal of Therapy:  Heparin level 0.3-0.7 units/ml Monitor platelets by anticoagulation protocol: Yes   Plan:  Continue heparin gtt at 700 units/hr Check heparin level in 8 hours Monitor daily heparin levels, CBC, s/sx bleeding   Juanell Fairly, PharmD PGY1 Pharmacy Resident 07/20/2018 3:04 PM

## 2018-07-20 NOTE — ED Notes (Signed)
2123-#18 OG placed 2124-Pt noted to be pulseless, CPR started MD called to bedside 2125-Epi given 2127-Calcium given No pulses noted 2128-CPR resumed 2129-Epi given 2131-No pulse, Vfib noted on monitor,  shock delivered 120j CPR resumed 2132-Epi given 2134-No pulse, Vfib noted on monitor Shock delivered 200j CPR resumed 2134-Amiodarone push given 2135-Epi given 2137-No pulse noted, Vfib on monitor Shock delivered 200j CPR resumed 2139-No pulses noted, Vfib on monitor Shock delivered 200j CPR resumed 2139-Epi given 2140-Calcium given 2141-No pulse noted, PEA on monitor CPR resumed 2142-Atropine given 2142-Mag 2mg  gtt started 2143 Epi given 2143 Amiodarone gtt started 2145-Pulses noted

## 2018-07-20 NOTE — TOC Initial Note (Signed)
Transition of Care [facility]) - Initial/Assessment Note    Patient Details  Name: Rebecca Benton MRN: [id number] Date of Birth: 1950-07-27  Transition of [facility]) CM/SW Contact:    Sherrilyn Rist Phone Number: [phone] 07/20/2018, 9:47 AM  Clinical Narrative:                 Patient admitted with witness arrest at home; on vent support/ pressors; CM following at a distance; condition guarded.  Expected Discharge Plan: (unknown at this time; on vent suppot/ pressors)     Patient Goals and CMS Choice Patient states their goals for this hospitalization and ongoing recovery are:: on vent support      Expected Discharge Plan and Services Expected Discharge Plan: (unknown at this time; on vent suppot/ pressors)                                              Prior Living Arrangements/Services                    Criminal Activity/Legal Involvement Pertinent to Current Situation/Hospitalization: No - Comment as needed  Activities of Daily Living      Permission Sought/Granted                  Emotional Assessment         Alcohol / Substance Use: Not Applicable Psych Involvement: No (comment)  Admission diagnosis:  Acidosis [E87.2] Ventricular fibrillation ([place]) [I49.01] Cardiac arrest ([place]) [I46.9] Acute respiratory failure with hypercapnia ([place]) [J96.02] Patient Active Problem List   Diagnosis Date Noted  . Cardiac arrest ([place]) 08/08/2018   PCP:  Lin Landsman, MD Pharmacy:  No Pharmacies Listed    Social Determinants of Health (SDOH) Interventions    Readmission Risk Interventions No flowsheet data found.

## 2018-07-20 NOTE — ED Notes (Signed)
Daughter at bedside of facetime with other family members

## 2018-07-20 NOTE — Progress Notes (Addendum)
[place] Progress Note Patient Name: Rebecca Benton DOB: [birth year] MRN: [id number]   Date of Service  07/20/2018  HPI/Events of Note  Patient is dyssynchronous with the ventilator.  Myoclonic jerking.  Started on Keppra today at 10 PM and received Versed this afternoon.  eICU Interventions  Ordered as needed Versed and fentanyl drip.  Use fentanyl drip only when as needed Versed is not sufficient.      Intervention Category Major Interventions: Respiratory failure - evaluation and management Intermediate Interventions: Medication change / dose adjustment;Pain - evaluation and management;Communication with other healthcare providers and/or family Evaluation Type: Other  Ephriam Jenkins Rebecca Benton 07/20/2018, 8:37 PM   12:12 AM Recurrent hypoglycemia with blood sugars between 33 to 44.  Ordered a D5 water at 50 ml per hour apart from already existing hypoglycemia supplements.

## 2018-07-20 NOTE — ED Notes (Signed)
Epi gtt increased to 73mcg

## 2018-07-20 NOTE — Progress Notes (Addendum)
ANTICOAGULATION CONSULT NOTE - Initial Consult  Pharmacy Consult for heparin Indication: chest pain/ACS  Allergies  Allergen Reactions  . Penicillins Anaphylaxis and Other (See Comments)    Did it involve swelling of the face/tongue/throat, SOB, or low BP? Yes Did it involve sudden or severe rash/hives, skin peeling, or any reaction on the inside of your mouth or nose? Yes Did you need to seek medical attention at a hospital or doctor's office? Yes When did it last happen?childhood If all above answers are "NO", may proceed with cephalosporin use.   . Tape Itching and Dermatitis    Patient Measurements: Height: '4\' 11"'$  (149.9 cm) Weight: 125 lb 3.5 oz (56.8 kg) IBW/kg (Calculated) : 43.2  Vital Signs: Temp: 98.8 F (37.1 C) (05/04 2200) Temp Source: Core (05/04 2000) BP: 109/52 (05/04 2200) Pulse Rate: 70 (05/04 2200)  Labs: Recent Labs    08/08/2018 2131  07/17/2018 2144 07/20/18 0327 07/20/18 0446 07/20/18 1048 07/20/18 1200 07/20/18 1720 07/20/18 2126 07/20/18 2228  HGB 11.2*   < > 11.9* 11.9* 10.7*  --   --   --   --   --   HCT 38.5   < > 35.0* 35.0* 34.6*  --   --   --   --   --   PLT 196  --   --   --  208  --   --   --   --   --   LABPROT 15.1  --   --   --   --   --   --   --   --   --   INR 1.2  --   --   --   --   --   --   --   --   --   HEPARINUNFRC  --   --   --   --   --   --  0.42  --   --  0.21*  CREATININE 4.41*  --  4.30*  --  4.20*  --   --   --   --   --   TROPONINI 0.03*  --   --   --  2.07* 2.11*  --  2.08* 1.35*  --    < > = values in this interval not displayed.    Estimated Creatinine Clearance: 9.7 mL/min (A) (by C-G formula based on SCr of 4.2 mg/dL (H)).   Medical History: Past Medical History:  Diagnosis Date  . CKD (chronic kidney disease) stage 4, GFR 15-29 ml/min (HCC)   . COPD (chronic obstructive pulmonary disease) ([place])   . Diabetes ([place])   . Hx of CABG   . Hypertension   . Stroke [facility])     Medications:   Medications Prior to Admission  Medication Sig Dispense Refill Last Dose  . albuterol (PROVENTIL) (2.5 MG/3ML) 0.083% nebulizer solution Take 2.5 mg by nebulization 3 (three) times daily as needed for wheezing or shortness of breath.   UNK  . albuterol (VENTOLIN HFA) 108 (90 Base) MCG/ACT inhaler Inhale 1-2 puffs into the lungs every 6 (six) hours as needed for wheezing or shortness of breath.   UNK  . ALPRAZolam (XANAX) 1 MG tablet Take 1 mg by mouth 3 (three) times daily as needed for anxiety.   UNK  . amLODipine (NORVASC) 5 MG tablet Take 5 mg by mouth daily.   UNK  . aspirin EC 81 MG tablet Take 81 mg by mouth daily.   UNK  .  atorvastatin (LIPITOR) 40 MG tablet Take 40 mg by mouth daily.   UNK  . carvedilol (COREG) 25 MG tablet Take 25 mg by mouth 2 (two) times daily with a meal.   UNK  . clopidogrel (PLAVIX) 75 MG tablet Take 75 mg by mouth daily.   UNK  . Cyanocobalamin 1000 MCG/ML KIT Inject 1 mL as directed every 30 (thirty) minutes.   07/11/2018  . DULoxetine (CYMBALTA) 30 MG capsule Take 30 mg by mouth daily.   UNK  . gabapentin (NEURONTIN) 300 MG capsule Take 300 mg by mouth at bedtime.   UNK  . isosorbide mononitrate (IMDUR) 30 MG 24 hr tablet Take 30 mg by mouth daily.   UNK  . oxyCODONE-acetaminophen (PERCOCET) 10-325 MG tablet Take 1 tablet by mouth every 6 (six) hours as needed for pain.   UNK   Scheduled:  . amiodarone  150 mg Intravenous Once  . aspirin  300 mg Rectal Daily  . chlorhexidine gluconate (MEDLINE KIT)  15 mL Mouth Rinse BID  . [START ON 07/21/2018] Chlorhexidine Gluconate Cloth  6 each Topical Daily  . insulin aspart  0-20 Units Subcutaneous Q4H  . mouth rinse  15 mL Mouth Rinse 10 times per day  . midazolam      . pantoprazole (PROTONIX) IV  40 mg Intravenous Daily  . sodium chloride flush  10-40 mL Intracatheter Q12H   Infusions:  .  prismasol BGK 4/2.5 300 mL/hr at 07/20/18 1744  .  prismasol BGK 4/2.5 500 mL/hr at 07/20/18 1744  . amiodarone 30 mg/hr  (07/20/18 2000)  . epinephrine 10 mcg/min (07/20/18 2235)  . fentaNYL infusion INTRAVENOUS 25 mcg/hr (07/20/18 2126)  . heparin 10,000 units/ 20 mL infusion syringe 650 Units/hr (07/20/18 2204)  . heparin Stopped (07/20/18 1524)  . [START ON 07/21/2018] levETIRAcetam    . midazolam 20 mg/hr (07/20/18 2239)  . norepinephrine (LEVOPHED) Adult infusion 10 mcg/min (07/20/18 2000)  . prismasol BGK 4/2.5 1,500 mL/hr at 07/20/18 2130  .  sodium bicarbonate  infusion 1000 mL 40 mL/hr at 07/20/18 2120    Assessment: 68yo female presented as code STEMI after PEA/Vfib arrests, was not brought to cath lab given risks > benefit, now to start heparin.  Confirmatory heparin level subtherapeutic at 0.21  Goal of Therapy:  Heparin level 0.3-0.7 units/ml Monitor platelets by anticoagulation protocol: Yes   Plan:  Increase heparin gtt to 800 units/hr F/u 8 hour heparin level   Addendum: Heparin level 1.1, confirmed with RN, will hold for 1 hour, decrease gtt to previous rate of 700 and check an 8 hour level  Bertis Ruddy, PharmD Clinical Pharmacist Please check AMION for all [place] numbers 07/20/2018 11:19 PM

## 2018-07-21 DIAGNOSIS — R570 Cardiogenic shock: Secondary | ICD-10-CM

## 2018-07-21 LAB — POCT ACTIVATED CLOTTING TIME
Activated Clotting Time: 186 seconds
Activated Clotting Time: 219 seconds
Activated Clotting Time: 241 seconds
Activated Clotting Time: 246 seconds
Activated Clotting Time: 235 seconds
Activated Clotting Time: 246 seconds
Activated Clotting Time: 252 seconds
Activated Clotting Time: 252 seconds
Activated Clotting Time: 263 seconds

## 2018-07-21 LAB — HEPARIN LEVEL (UNFRACTIONATED)
Heparin Unfractionated: 1.1 IU/mL — ABNORMAL HIGH (ref 0.30–0.70)
Heparin Unfractionated: 0.98 IU/mL — ABNORMAL HIGH (ref 0.30–0.70)

## 2018-07-21 LAB — GLUCOSE, CAPILLARY
Glucose-Capillary: 115 mg/dL — ABNORMAL HIGH (ref 70–99)
Glucose-Capillary: 123 mg/dL — ABNORMAL HIGH (ref 70–99)
Glucose-Capillary: 124 mg/dL — ABNORMAL HIGH (ref 70–99)
Glucose-Capillary: 125 mg/dL — ABNORMAL HIGH (ref 70–99)
Glucose-Capillary: 133 mg/dL — ABNORMAL HIGH (ref 70–99)
Glucose-Capillary: 144 mg/dL — ABNORMAL HIGH (ref 70–99)
Glucose-Capillary: 15 mg/dL — CL (ref 70–99)
Glucose-Capillary: 156 mg/dL — ABNORMAL HIGH (ref 70–99)
Glucose-Capillary: 158 mg/dL — ABNORMAL HIGH (ref 70–99)
Glucose-Capillary: 18 mg/dL — CL (ref 70–99)
Glucose-Capillary: 33 mg/dL — CL (ref 70–99)
Glucose-Capillary: 39 mg/dL — CL (ref 70–99)

## 2018-07-21 LAB — RENAL FUNCTION PANEL
Albumin: 2.1 g/dL — ABNORMAL LOW (ref 3.5–5.0)
Albumin: 2 g/dL — ABNORMAL LOW (ref 3.5–5.0)
Anion gap: 11 (ref 5–15)
Anion gap: 9 (ref 5–15)
BUN: 31 mg/dL — ABNORMAL HIGH (ref 8–23)
BUN: 18 mg/dL (ref 8–23)
CO2: 22 mmol/L (ref 22–32)
CO2: 24 mmol/L (ref 22–32)
Calcium: 7.7 mg/dL — ABNORMAL LOW (ref 8.9–10.3)
Calcium: 7.4 mg/dL — ABNORMAL LOW (ref 8.9–10.3)
Chloride: 101 mmol/L (ref 98–111)
Chloride: 99 mmol/L (ref 98–111)
Creatinine, Ser: 2.8 mg/dL — ABNORMAL HIGH (ref 0.44–1.00)
Creatinine, Ser: 1.91 mg/dL — ABNORMAL HIGH (ref 0.44–1.00)
GFR calc Af Amer: 19 mL/min — ABNORMAL LOW (ref >60)
GFR calc Af Amer: 30 mL/min — ABNORMAL LOW (ref >60)
GFR calc non Af Amer: 17 mL/min — ABNORMAL LOW (ref >60)
GFR calc non Af Amer: 26 mL/min — ABNORMAL LOW (ref >60)
Glucose, Bld: 120 mg/dL — ABNORMAL HIGH (ref 70–99)
Glucose, Bld: 162 mg/dL — ABNORMAL HIGH (ref 70–99)
Phosphorus: 2.9 mg/dL (ref 2.5–4.6)
Phosphorus: 2.3 mg/dL — ABNORMAL LOW (ref 2.5–4.6)
Potassium: 4.9 mmol/L (ref 3.5–5.1)
Potassium: 5 mmol/L (ref 3.5–5.1)
Sodium: 134 mmol/L — ABNORMAL LOW (ref 135–145)
Sodium: 132 mmol/L — ABNORMAL LOW (ref 135–145)

## 2018-07-21 LAB — HEPATIC FUNCTION PANEL
ALT: 139 U/L — ABNORMAL HIGH (ref 0–44)
AST: 129 U/L — ABNORMAL HIGH (ref 15–41)
Albumin: 2.2 g/dL — ABNORMAL LOW (ref 3.5–5.0)
Alkaline Phosphatase: 78 U/L (ref 38–126)
Bilirubin, Direct: 0.2 mg/dL (ref 0.0–0.2)
Indirect Bilirubin: 0.4 mg/dL (ref 0.3–0.9)
Total Bilirubin: 0.6 mg/dL (ref 0.3–1.2)
Total Protein: 4.7 g/dL — ABNORMAL LOW (ref 6.5–8.1)

## 2018-07-21 LAB — URINE CULTURE: Culture: NO GROWTH

## 2018-07-21 LAB — URINALYSIS, ROUTINE W REFLEX MICROSCOPIC
Bilirubin Urine: NEGATIVE
Glucose, UA: 50 mg/dL — AB
Ketones, ur: NEGATIVE mg/dL
Nitrite: NEGATIVE
Protein, ur: >=300 mg/dL — AB
RBC / HPF: >50 RBC/hpf — ABNORMAL HIGH (ref 0–5)
Specific Gravity, Urine: 1.016 (ref 1.005–1.030)
pH: 5 (ref 5.0–8.0)

## 2018-07-21 LAB — CBC
HCT: 31.2 % — ABNORMAL LOW (ref 36.0–46.0)
Hemoglobin: 10.2 g/dL — ABNORMAL LOW (ref 12.0–15.0)
MCH: 28.7 pg (ref 26.0–34.0)
MCHC: 32.7 g/dL (ref 30.0–36.0)
MCV: 87.9 fL (ref 80.0–100.0)
Platelets: 118 10*3/uL — ABNORMAL LOW (ref 150–400)
RBC: 3.55 MIL/uL — ABNORMAL LOW (ref 3.87–5.11)
RDW: 14.3 % (ref 11.5–15.5)
WBC: 10.8 10*3/uL — ABNORMAL HIGH (ref 4.0–10.5)
nRBC: 0.3 % — ABNORMAL HIGH (ref 0.0–0.2)

## 2018-07-21 LAB — MAGNESIUM: Magnesium: 2.1 mg/dL (ref 1.7–2.4)

## 2018-07-21 LAB — APTT: aPTT: >200 seconds (ref 24–36)

## 2018-07-21 MED ORDER — VITAL AF 1.2 CAL PO LIQD
1000.0000 mL | ORAL | Status: DC
  Administered 2018-07-21: 1000 mL

## 2018-07-21 MED ORDER — DEXTROSE 5 % IV SOLN
INTRAVENOUS | Status: DC
  Administered 2018-07-21: 01:00:00 via INTRAVENOUS
  Administered 2018-07-21: 1 mL via INTRAVENOUS

## 2018-07-21 MED ORDER — SODIUM CHLORIDE 0.9 % IV SOLN
750.0000 mg | Freq: Two times a day (BID) | INTRAVENOUS | Status: DC
  Administered 2018-07-21 – 2018-07-23 (×5): 750 mg via INTRAVENOUS
  Filled 2018-07-21 (×7): qty 7.5

## 2018-07-21 MED ORDER — INSULIN ASPART 100 UNIT/ML ~~LOC~~ SOLN
0.0000 [IU] | SUBCUTANEOUS | Status: DC
  Administered 2018-07-21 (×2): 1 [IU] via SUBCUTANEOUS
  Administered 2018-07-21 – 2018-07-22 (×3): 2 [IU] via SUBCUTANEOUS
  Administered 2018-07-22: 1 [IU] via SUBCUTANEOUS
  Administered 2018-07-22 (×2): 2 [IU] via SUBCUTANEOUS
  Administered 2018-07-22: 1 [IU] via SUBCUTANEOUS
  Administered 2018-07-23: 2 [IU] via SUBCUTANEOUS
  Administered 2018-07-23: 1 [IU] via SUBCUTANEOUS
  Administered 2018-07-23: 2 [IU] via SUBCUTANEOUS
  Administered 2018-07-23: 1 [IU] via SUBCUTANEOUS
  Administered 2018-07-23 – 2018-07-24 (×2): 2 [IU] via SUBCUTANEOUS

## 2018-07-21 MED ORDER — VITAL AF 1.2 CAL PO LIQD
1000.0000 mL | ORAL | Status: DC
  Administered 2018-07-23: 1000 mL

## 2018-07-21 MED ORDER — HEPARIN (PORCINE) 25000 UT/250ML-% IV SOLN
500.0000 [IU]/h | INTRAVENOUS | Status: DC
  Administered 2018-07-21: 500 [IU]/h via INTRAVENOUS
  Filled 2018-07-21 (×2): qty 250

## 2018-07-21 MED ORDER — SODIUM CHLORIDE 0.9 % IV SOLN
20.0000 mg/h | INTRAVENOUS | Status: DC
  Administered 2018-07-21 – 2018-07-22 (×6): 20 mg/h via INTRAVENOUS
  Filled 2018-07-21 (×7): qty 20

## 2018-07-21 NOTE — Progress Notes (Addendum)
Nutrition Follow-up  RD working remotely.  DOCUMENTATION CODES:   Not applicable  INTERVENTION:   Trickle TF via OGT given MAP < 60: - Vital AF 1.2 @ 20 ml/hr (480 ml/day)  Tube feeding regimen provides 576 kcal, 36 grams of protein, and 389 ml of H2O.  Once MAP > 60, RD will place titration orders to advance to goal TF regimen: - Vital AF 1.2 @ 30 ml/hr (720 ml/day) - Pro-stat 30 ml TID  Goal tube feeding regimen provides 1164 kcal, 99 grams of protein, and 584 ml of H2O (97% of kcal needs, 100% of protein needs).  NUTRITION DIAGNOSIS:   Inadequate oral intake related to inability to eat as evidenced by NPO status.  Ongoing, being addressed via TF  GOAL:   Patient will meet greater than or equal to 90% of their needs  Progressing  MONITOR:   Vent status, TF tolerance, Weight trends, I & O's, Labs, Other (GOC)  REASON FOR ASSESSMENT:   Consult Enteral/tube feeding initiation and management  ASSESSMENT:   68 year old female who presented to the ED on 5/3 after a witnessed PEA arrest. PMH of CKD stage IV, COPD, DM, HTN, CAD s/p CABG 2016, right BKA 2017. While in the ED, pt had a V-fib arrest. Pt intubated and transferred to the ICU. TTM deferred due to prolonged arrest.  5/4 - CRRT initiated  Pt with poor prognosis and multiorgan failure with what appears to be severe anoxic encephalopathy per Cardiology notes. Family pursuing aggressive care.  CRRT started yesterday and plan is to trial for 24-48 hours. Per Nephrology, pt is not a candidate for outpatient HD.  Noted pt with multiple hypoglycemic episodes overnight, no won D5 drip.  OGT in place.  Given MAP < 60 and pt on multiple pressors, will trial trickle TF and monitor for ability to advance to goal TF regimen.  Patient is currently intubated on ventilator support MV: 7.4 L/min Temp (24hrs), Avg:99 F (37.2 C), Min:96.1 F (35.6 C), Max:101.7 F (38.7 C) BP: 108/40 MAP: 56  Amiodarone: 16.7  ml/hr D5: 50 ml/hr Epinephrine: 33.8 ml/hr Fentanyl: 5 ml/hr Heparin: 7 ml/hr Versed: 20 ml/hr Levophed: 7.5 ml/hr  Medications reviewed and include: SSI q 4 hours, Protonix, IV Keppra  Labs reviewed: sodium 134 (L), elevated LFTs CBG's: 124, 123, 156, 39, 125, 33, 113, 15, 18, 18 x 24 hours K+, mag, phos all WNL.  UOP: 170 ml x 24 hours CRRT UF: 2034 ml x 24 hours I/O's: +2.4 L since admit  Diet Order:   Diet Order            Diet NPO time specified  Diet effective now              EDUCATION NEEDS:   Not appropriate for education at this time  Skin:  Skin Assessment: Reviewed RN Assessment  Last BM:  no documented BM  Height:   Ht Readings from Last 1 Encounters:  07/20/18 4\' 11"  (1.499 m)    Weight:   Wt Readings from Last 1 Encounters:  07/21/18 59.8 kg   EDW: 56.8 kg  Ideal Body Weight:  40.39 kg (adjusted for BKA)  BMI:  Body mass index is 26.63 kg/m.  Estimated Nutritional Needs:   Kcal:  1245  Protein:  90-105 grams  Fluid:  per MD    Gaynell Face, MS, RD, LDN Inpatient Clinical Dietitian Pager: [phone] Weekend/After Hours: [phone]

## 2018-07-21 NOTE — Progress Notes (Signed)
ANTICOAGULATION CONSULT NOTE - [place] for heparin Indication: chest pain/ACS  Allergies  Allergen Reactions  . Penicillins Anaphylaxis and Other (See Comments)    Did it involve swelling of the face/tongue/throat, SOB, or low BP? Yes Did it involve sudden or severe rash/hives, skin peeling, or any reaction on the inside of your mouth or nose? Yes Did you need to seek medical attention at a hospital or doctor's office? Yes When did it last happen?childhood If all above answers are "NO", may proceed with cephalosporin use.   . Tape Itching and Dermatitis    Patient Measurements: Height: '4\' 11"'$  (149.9 cm) Weight: 131 lb 13.4 oz (59.8 kg)(CRRT tubing in bed & bair hugger on pt ) IBW/kg (Calculated) : 43.2  Vital Signs: Temp: 99 F (37.2 C) (05/05 1500) Temp Source: Core (05/05 1139) BP: 110/41 (05/05 1500) Pulse Rate: 68 (05/05 1500)  Labs: Recent Labs    08/08/2018 2131  07/20/18 0327 07/20/18 0446 07/20/18 1048 07/20/18 1200 07/20/18 1720 07/20/18 2126 07/20/18 2228 07/21/18 0315  HGB 11.2*   < > 11.9* 10.7*  --   --   --   --   --  10.2*  HCT 38.5   < > 35.0* 34.6*  --   --   --   --   --  31.2*  PLT 196  --   --  208  --   --   --   --   --  118*  APTT  --   --   --   --   --   --   --   --   --  >200*  LABPROT 15.1  --   --   --   --   --   --   --   --   --   INR 1.2  --   --   --   --   --   --   --   --   --   HEPARINUNFRC  --   --   --   --   --  0.42  --   --  0.21* 1.10*  CREATININE 4.41*   < >  --  4.20*  --   --   --  3.57*  --  2.80*  TROPONINI 0.03*  --   --  2.07* 2.11*  --  2.08* 1.35*  --   --    < > = values in this interval not displayed.    Estimated Creatinine Clearance: 14.9 mL/min (A) (by C-G formula based on SCr of 2.8 mg/dL (H)).   Medical History: Past Medical History:  Diagnosis Date  . CKD (chronic kidney disease) stage 4, GFR 15-29 ml/min (HCC)   . COPD (chronic obstructive pulmonary disease) ([place])    . Diabetes ([place])   . Hx of CABG   . Hypertension   . Stroke [facility])     Medications:  Medications Prior to Admission  Medication Sig Dispense Refill Last Dose  . albuterol (PROVENTIL) (2.5 MG/3ML) 0.083% nebulizer solution Take 2.5 mg by nebulization 3 (three) times daily as needed for wheezing or shortness of breath.   UNK  . albuterol (VENTOLIN HFA) 108 (90 Base) MCG/ACT inhaler Inhale 1-2 puffs into the lungs every 6 (six) hours as needed for wheezing or shortness of breath.   UNK  . ALPRAZolam (XANAX) 1 MG tablet Take 1 mg by mouth 3 (three) times daily as needed for anxiety.  UNK  . amLODipine (NORVASC) 5 MG tablet Take 5 mg by mouth daily.   UNK  . aspirin EC 81 MG tablet Take 81 mg by mouth daily.   UNK  . atorvastatin (LIPITOR) 40 MG tablet Take 40 mg by mouth daily.   UNK  . carvedilol (COREG) 25 MG tablet Take 25 mg by mouth 2 (two) times daily with a meal.   UNK  . clopidogrel (PLAVIX) 75 MG tablet Take 75 mg by mouth daily.   UNK  . Cyanocobalamin 1000 MCG/ML KIT Inject 1 mL as directed every 30 (thirty) minutes.   07/11/2018  . DULoxetine (CYMBALTA) 30 MG capsule Take 30 mg by mouth daily.   UNK  . gabapentin (NEURONTIN) 300 MG capsule Take 300 mg by mouth at bedtime.   UNK  . isosorbide mononitrate (IMDUR) 30 MG 24 hr tablet Take 30 mg by mouth daily.   UNK  . oxyCODONE-acetaminophen (PERCOCET) 10-325 MG tablet Take 1 tablet by mouth every 6 (six) hours as needed for pain.   UNK   Scheduled:  . amiodarone  150 mg Intravenous Once  . aspirin  300 mg Rectal Daily  . chlorhexidine gluconate (MEDLINE KIT)  15 mL Mouth Rinse BID  . Chlorhexidine Gluconate Cloth  6 each Topical Daily  . insulin aspart  0-9 Units Subcutaneous Q4H  . mouth rinse  15 mL Mouth Rinse 10 times per day  . pantoprazole (PROTONIX) IV  40 mg Intravenous Daily  . sodium chloride flush  10-40 mL Intracatheter Q12H   Infusions:  .  prismasol BGK 4/2.5 1 each (07/21/18 1104)  .  prismasol BGK 4/2.5 1  each (07/21/18 1420)  . amiodarone 30 mg/hr (07/21/18 1500)  . dextrose 25 mL/hr at 07/21/18 1500  . epinephrine 5 mcg/min (07/21/18 1603)  . feeding supplement (VITAL AF 1.2 CAL) 20 mL/hr at 07/21/18 1130  . fentaNYL infusion INTRAVENOUS 50 mcg/hr (07/21/18 1500)  . heparin 700 Units/hr (07/21/18 1500)  . levETIRAcetam Stopped (07/21/18 1019)  . midazolam 20 mg/hr (07/21/18 1517)  . norepinephrine (LEVOPHED) Adult infusion 14 mcg/min (07/21/18 1500)  . prismasol BGK 4/2.5 1 mL (07/21/18 1346)    Assessment: 68yo female presented as code STEMI after PEA/Vfib arrests, was not brought to cath lab given risks > benefit, now to start heparin.  Heparin held this morning with high level, repeat level 0.99 per lab (not in [facility] yet due to [employer] issues).   Goal of Therapy:  Heparin level 0.3-0.7 units/ml Monitor platelets by anticoagulation protocol: Yes   Plan:  Hold heparin x60 min then resume at 500 units/h Recheck heparin level in [place], PharmD, BCPS Clinical Pharmacist [phone] Please check AMION for all [place] numbers 07/21/2018

## 2018-07-21 NOTE — Progress Notes (Signed)
CRITICAL VALUE ALERT  Critical Value:  PTT >200  Date & Time Notied:  Today @0500   Provider Notified: on the phone w/ pharmacist currently about changing heparin gtt infusion rate  Orders Received/Actions taken: drop heparin gtt from 800units/hr to 700units/hr

## 2018-07-21 NOTE — Progress Notes (Signed)
NAME:  Rebecca Benton, MRN:  [id number], DOB:  1950-12-15, LOS: 2 ADMISSION DATE:  08/16/2018, CONSULTATION DATE:  07/23/2018 REFERRING MD:  Dr. Regenia Skeeter, CHIEF COMPLAINT:  Cardiac arrest   Brief History   68 year old female presenting from home with witnessed prolonged cardiac arrest. Found in PEA by EMS. In the ED, had second PEA arrest. Likely primary cardiac event, however given her poor neurological function, she is not a candidate for intervention. Remains on multiple pressors.   Past Medical History  COPD, CKD stage 4, DM, CAD s/p CABG 2016, HTN, stroke, R BKA 2017  Significant Hospital Events   5/3 Admit/ cardiac arrest   Consults:  Cardiology  Procedures:  5/3 ETT >>  Significant Diagnostic Tests:  5/4 [facility] >> Old left frontal infarct. No intracranial abnormality  Micro Data:  5/3 BCx 2 >>  NGTD, pending 5/3 UC >> Neg 5/3 trach asp >> 5/3 COVID >> neg  Antimicrobials:   Interim history/subjective:  Started on CRRT yesterday. On versed, fentanyl and keppra for myoclonus  Objective   Blood pressure (!) 123/41, pulse 60, temperature 97.9 F (36.6 C), resp. rate (!) 22, height 4\' 11"  (1.499 m), weight 59.8 kg, SpO2 99 %. CVP:  [1 mmHg-13 mmHg] 4 mmHg  Vent Mode: PRVC FiO2 (%):  [40 %-50 %] 50 % Set Rate:  [22 bmp] 22 bmp Vt Set:  [340 mL] 340 mL PEEP:  [5 cmH20] 5 cmH20 Plateau Pressure:  [13 cmH20-28 cmH20] 14 cmH20   Intake/Output Summary (Last 24 hours) at 07/21/2018 0837 Last data filed at 07/21/2018 0800 Gross per 24 hour  Intake 3947.93 ml  Output 2372 ml  Net 1575.93 ml   Filed Weights   07/18/2018 2057 07/20/18 0215 07/21/18 0407  Weight: 56.7 kg 56.8 kg 59.8 kg   Physical Exam: General: Critically ill-appearing, intermittent myoclonus HENT: [place], AT, ETT in place Respiratory: Clear to auscultation bilaterally.  No crackles, wheezing or rales Cardiovascular: RRR, -M/R/G, no JVD GI: BS+, soft, nontender Extremities:-Edema,-tenderness Neuro: Unresponsive,  full body myoclonus GU: Foley in place  Resolved Hospital Problem list    Assessment & Plan:  68 year old female with past history of COPD, CKD stage 4, DM, CAD s/p CABG 2016, HTN, stroke, and R BKA 2017 presenting from home with cardiac arrest.    Acute encephalopathy Myoclonus Concerned for anoxic brain injury CT with NAICA EEG with GPEDs Precludes ability to liberate from ventilator P: Neuro checks Keppra, Versed and Fentanyl for myoclonus. Will start weaning after 24 hours. Appreciate Neurology recs  Acute respiratory failure COPD On minimal vent settings. Unable to extubate d/t neuro status P:  Decreased FIO2 to 40% Full vent support VAP bundle Duonebs q6 prn   Cardiac arrest Cardiogenic shock  Acute congestive heart failure Initial prolonged PEA/ then VT. Likely cardiac etiology Troponin peaked 2.11 TTE with reduced EF 35-50% and impaired relaxation Unfortunately, due to her poor neurologic exam and prolonged arrest is not a candidate for interventional workup  P:  Defer TTM due to prolonged arrest Telemetry Cardiology following Wean epi and levo for SBP goal >90 Amio gtt Heparin gtt Daily ASA Goal K >4 and Mg >2  AKI P:  CRRT per nephrology DC bicarb gtt  Trend BMP / urinary output Replace electrolytes as indicated Avoid nephrotoxic agents, ensure adequate renal perfusion  Transaminitis Likely shock liver P:  Trend LFTs/ coags   DM Hyperglycemia/Hypoglycemia P:  D5 gtt Change to SSI to sensitive scale CBG q 4  Best  practice:  Diet: Start TF Pain/Anxiety/Delirium protocol (if indicated): as above for myoclonus VAP protocol (if indicated): yes DVT prophylaxis: heparin gtt GI prophylaxis: PPI Glucose control: SSI  Mobility: BR Code Status: Remain full code Family Communication:  -Updated daughter Sherrine Maples on 5/4. -Daughter Jolanda [phone]), 2nd contact/family Modena Slater [phone].  Jolanda ask that no information be given out  other than these 2 listed.  Disposition: ICU  Labs   CBC: Recent Labs  Lab 07/17/2018 2131 08/14/2018 2135 07/31/2018 2144 07/20/18 0327 07/20/18 0446 07/21/18 0315  WBC 16.3*  --   --   --  14.0* 10.8*  NEUTROABS 9.4*  --   --   --   --   --   HGB 11.2* 11.6* 11.9* 11.9* 10.7* 10.2*  HCT 38.5 34.0* 35.0* 35.0* 34.6* 31.2*  MCV 96.0  --   --   --  90.8 87.9  PLT 196  --   --   --  208 118*    Basic Metabolic Panel: Recent Labs  Lab 07/27/2018 2131  07/31/2018 2144 07/20/18 0327 07/20/18 0446 07/20/18 2126 07/21/18 0315  NA 136   < > 135 133* 135 137 134*  K 5.8*   < > 5.7* 6.0* 5.7* 4.6 4.9  CL 105  --   --   --  106 106 101  CO2 14*  --   --   --  17* 19* 22  GLUCOSE 262*  --   --   --  476* 44* 120*  BUN 51*  --   --   --  57* 45* 31*  CREATININE 4.41*  --  4.30*  --  4.20* 3.57* 2.80*  CALCIUM 8.5*  --   --   --  9.9 8.1* 7.7*  MG  --   --   --   --  2.4  --  2.1  PHOS  --   --   --   --  5.3* 4.5 2.9   < > = values in this interval not displayed.   GFR: Estimated Creatinine Clearance: 14.9 mL/min (A) (by C-G formula based on SCr of 2.8 mg/dL (H)). Recent Labs  Lab 08/03/2018 2131 08/07/2018 2248 07/20/18 0446 07/20/18 0951 07/20/18 1251 07/21/18 0315  WBC 16.3*  --  14.0*  --   --  10.8*  LATICACIDVEN  --  5.6*  --  2.3* 2.4*  --     Liver Function Tests: Recent Labs  Lab 07/31/2018 2131 07/20/18 0446 07/20/18 2126 07/21/18 0315  AST 102* 412*  --   --   ALT 82* 222*  --   --   ALKPHOS 100 87  --   --   BILITOT 0.6 1.0  --   --   PROT 6.0* 5.1*  --   --   ALBUMIN 2.8* 2.4* 2.3* 2.1*   No results for input(s): LIPASE, AMYLASE in the last 168 hours. No results for input(s): AMMONIA in the last 168 hours.  ABG    Component Value Date/Time   PHART 7.395 07/20/2018 0327   PCO2ART 34.9 07/20/2018 0327   PO2ART 421.0 (H) 07/20/2018 0327   HCO3 22.2 07/20/2018 0327   TCO2 23 07/20/2018 0327   ACIDBASEDEF 3.0 (H) 07/20/2018 0327   O2SAT 76.1 07/20/2018  0440     Coagulation Profile: Recent Labs  Lab 07/20/2018 2131  INR 1.2    Cardiac Enzymes: Recent Labs  Lab 07/17/2018 2131 07/20/18 0446 07/20/18 1048 07/20/18 1720 07/20/18 2126  TROPONINI 0.03* 2.07* 2.11*  2.08* 1.35*    HbA1C: No results found for: HGBA1C  CBG: Recent Labs  Lab 07/20/18 1952 07/20/18 2017 07/20/18 2327 07/20/18 2350 07/21/18 0317  GLUCAP 33* 125* 39* 156* 123*    Critical care time: 50 min    The patient is critically ill with multiple organ systems failure and requires high complexity decision making for assessment and support, frequent evaluation and titration of therapies, application of advanced monitoring technologies and extensive interpretation of multiple databases.   Critical Care Time devoted to patient care services described in this note is 50 Minutes. This time reflects time of care of this signee Dr. Rodman Pickle. This critical care time does not reflect procedure time, or teaching time or supervisory time of PA/NP/Med student/Med Resident etc but could involve care discussion time.  Rodman Pickle, M.D. [facility] Pulmonary/Critical Care Medicine Pager: [phone] After hours pager: [phone]

## 2018-07-21 NOTE — Progress Notes (Signed)
[facility] NEPHROLOGY PROGRESS NOTE  Assessment/ Plan: Pt is a 68 y.o. yo female with history of hypertension, CAD status post CABG, stroke, right BKA, COPD, CKD stage IV per problem list presented with weakness cardiac arrest twice, consulted for AKI.  Started CRRT on 5/4.  #Acute kidney injury on CKD likely ischemic ATN due to cardiogenic shock/cardiac arrest, anuric.  Patient likely with multiorgan failure including anoxic brain injury.  After lengthy discussion with the patient's daughter, he started CRRT on 5/4 for about 24 to 48 hours.  I do not think patient is a candidate for outpatient treatment.  On heparin for anticoagulation, 4K, start 50 cc an hour ultrafiltration as tolerated.  Recommend palliative care consult.  #Cardiogenic shock/cardiac arrest: On epinephrine, Levophed.  Arterial blood pressure acceptable at this time.  Cardiology following.  #Myoclonic jerk/possible anoxic brain injury: Seen by neurology likely diffuse anoxic brain injury.  He started Keppra and Versed for myoclonic jerk.  #Hyperkalemia: Improved.  Monitor labs.  #Lactic acidosis.  #Poor prognosis.  Subjective: Seen and examined in ICU.  Receiving CRRT.  On sedation and pressors. Objective Vital signs in last 24 hours: Vitals:   07/21/18 0815 07/21/18 0830 07/21/18 0845 07/21/18 0900  BP:    (!) 92/39  Pulse: 61 60 61 61  Resp: (!) 23 (!) 22 (!) 23 (!) 22  Temp: 97.7 F (36.5 C) (!) 97.5 F (36.4 C) (!) 97.3 F (36.3 C) (!) 97.2 F (36.2 C)  TempSrc:      SpO2: 99% 99% 95% 97%  Weight:      Height:       Weight change: 3.1 kg  Intake/Output Summary (Last 24 hours) at 07/21/2018 0958 Last data filed at 07/21/2018 0900 Gross per 24 hour  Intake 3990.06 ml  Output 2558 ml  Net 1432.06 ml       Labs: Basic Metabolic Panel: Recent Labs  Lab 07/20/18 0446 07/20/18 2126 07/21/18 0315  NA 135 137 134*  K 5.7* 4.6 4.9  CL 106 106 101  CO2 17* 19* 22  GLUCOSE 476* 44*  120*  BUN 57* 45* 31*  CREATININE 4.20* 3.57* 2.80*  CALCIUM 9.9 8.1* 7.7*  PHOS 5.3* 4.5 2.9   Liver Function Tests: Recent Labs  Lab 08/09/2018 2131 07/20/18 0446 07/20/18 2126 07/21/18 0315  AST 102* 412*  --   --   ALT 82* 222*  --   --   ALKPHOS 100 87  --   --   BILITOT 0.6 1.0  --   --   PROT 6.0* 5.1*  --   --   ALBUMIN 2.8* 2.4* 2.3* 2.1*   No results for input(s): LIPASE, AMYLASE in the last 168 hours. No results for input(s): AMMONIA in the last 168 hours. CBC: Recent Labs  Lab 07/18/2018 2131  07/20/18 0327 07/20/18 0446 07/21/18 0315  WBC 16.3*  --   --  14.0* 10.8*  NEUTROABS 9.4*  --   --   --   --   HGB 11.2*   < > 11.9* 10.7* 10.2*  HCT 38.5   < > 35.0* 34.6* 31.2*  MCV 96.0  --   --  90.8 87.9  PLT 196  --   --  208 118*   < > = values in this interval not displayed.   Cardiac Enzymes: Recent Labs  Lab 07/25/2018 2131 07/20/18 0446 07/20/18 1048 07/20/18 1720 07/20/18 2126  TROPONINI 0.03* 2.07* 2.11* 2.08* 1.35*   CBG: Recent Labs  Lab 07/20/18 1952 07/20/18 2017 07/20/18 2327 07/20/18 2350 07/21/18 0317  GLUCAP 33* 125* 39* 156* 123*    Iron Studies: No results for input(s): IRON, TIBC, TRANSFERRIN, FERRITIN in the last 72 hours. Studies/Results: Ct Head Wo Contrast  Result Date: 07/20/2018 CLINICAL DATA:  Witnessed arrest EXAM: CT HEAD WITHOUT CONTRAST TECHNIQUE: Contiguous axial images were obtained from the base of the skull through the vertex without intravenous contrast. COMPARISON:  None. FINDINGS: Brain: Evidence of old infarct and encephalomalacia within the left frontal lobe. Chronic small vessel disease throughout the deep white matter. No acute intracranial abnormality. Specifically, no hemorrhage, hydrocephalus, mass lesion, acute infarction, or significant intracranial injury. Vascular: No hyperdense vessel or unexpected calcification. Skull: No acute calvarial abnormality. Sinuses/Orbits: Coastal thickening throughout the  paranasal sinuses. Other: None IMPRESSION: Old left frontal infarct. No acute intracranial abnormality. Chronic sinusitis. Electronically Signed   By: Rolm Baptise M.D.   On: 07/20/2018 02:08   US Renal  Result Date: 07/20/2018 CLINICAL DATA:  Acute renal injury EXAM: RENAL / URINARY TRACT ULTRASOUND COMPLETE COMPARISON:  None. FINDINGS: Right Kidney: Renal measurements: 8.9 x 4.8 x 5.7 cm. = volume: 126 mL. Mild increased echogenicity is noted. Two small cysts are identified. The largest of these measures 1.6 cm. No hydronephrosis is noted. Left Kidney: Renal measurements: 9.9 x 4.7 x 5.1 cm. = volume: 122 mL. Cystic lesion is noted measuring 3.3 cm in greatest dimension in the upper pole of the left kidney. There is some suggestion of mild septation although this may represent 2 adjacent cysts. Mild increased echogenicity is noted. No hydronephrosis is noted. Bladder: Bladder is decompressed by Foley catheter. IMPRESSION: Cystic changes bilaterally. Suggestion of mild septation is noted on the left. Short-term follow-up exam in 6 months is recommended to assess for stability. No hydronephrosis is noted. Electronically Signed   By: Inez Catalina M.D.   On: 07/20/2018 17:29   Dg Chest Port 1 View  Result Date: 07/20/2018 CLINICAL DATA:  Central line placement. EXAM: PORTABLE CHEST 1 VIEW COMPARISON:  Chest x-ray dated 07/20/2018. FINDINGS: The endotracheal tube terminates just above the carina by approximately 2 cm. The newly placed right-sided central venous catheter tip terminates over the mid to distal SVC. The left-sided central venous catheter is unchanged in position. The enteric tube extends below the left hemidiaphragm. Evaluation is somewhat limited by overlying defibrillator pads. There is no pneumothorax. Trace bilateral pleural effusions are noted. IMPRESSION: Newly placed right-sided central venous catheter tip projects over the SVC. The endotracheal tube terminates just above the carina by  approximately 2 cm. Otherwise, stable appearance of the chest. No pneumothorax. Electronically Signed   By: Constance Holster M.D.   On: 07/20/2018 16:42   Dg Chest Port 1 View  Result Date: 07/20/2018 CLINICAL DATA:  68 y/o  F; central line placement. EXAM: PORTABLE CHEST 1 VIEW COMPARISON:  07/18/2018 chest radiograph FINDINGS: Endotracheal tube 3.1 cm above the carina. Enteric tube tip extends below the field of view into the abdomen. Left central venous catheter tip projects over the upper SVC. No pneumothorax. Stable cardiomediastinal silhouette. Post median sternotomy with wires in alignment. No new consolidation, effusion, or pneumothorax. No acute osseous abnormality is evident. IMPRESSION: Left central venous catheter tip projects over upper SVC. No acute pulmonary process identified. Electronically Signed   By: Kristine Garbe M.D.   On: 07/20/2018 03:10   Dg Chest Portable 1 View  Result Date: 07/18/2018 CLINICAL DATA:  68 y/o F; patient coded, respiratory distress, intubation.  EXAM: PORTABLE CHEST 1 VIEW COMPARISON:  None. FINDINGS: Normal cardiac silhouette given projection and technique. Post median sternotomy with wires intact. Endotracheal tube tip 3.4 cm above the carina. Enteric tube tip extends below the field of view into the abdomen. Aortic atherosclerosis with calcification. Clear lungs. No pleural effusion or pneumothorax. No acute osseous abnormality is evident. IMPRESSION: 1. Endotracheal tube tip 3.4 cm above the carina. Enteric tube tip extends below the field of view into the abdomen. 2. No acute pulmonary process identified. Electronically Signed   By: Kristine Garbe M.D.   On: 07/25/2018 22:25    Medications: Infusions: .  prismasol BGK 4/2.5 300 mL/hr at 07/20/18 1744  .  prismasol BGK 4/2.5 500 mL/hr at 07/21/18 0401  . amiodarone 30 mg/hr (07/21/18 0900)  . dextrose 50 mL/hr at 07/21/18 0931  . epinephrine 9 mcg/min (07/21/18 0900)  . feeding  supplement (VITAL AF 1.2 CAL) 1,000 mL (07/21/18 0958)  . fentaNYL infusion INTRAVENOUS 50 mcg/hr (07/21/18 0900)  . heparin 10,000 units/ 20 mL infusion syringe Stopped (07/21/18 0806)  . heparin 700 Units/hr (07/21/18 0900)  . levETIRAcetam    . midazolam 20 mg/hr (07/21/18 0921)  . norepinephrine (LEVOPHED) Adult infusion 8 mcg/min (07/21/18 0900)  . prismasol BGK 4/2.5 1,500 mL/hr at 07/21/18 0321    Scheduled Medications: . amiodarone  150 mg Intravenous Once  . aspirin  300 mg Rectal Daily  . chlorhexidine gluconate (MEDLINE KIT)  15 mL Mouth Rinse BID  . Chlorhexidine Gluconate Cloth  6 each Topical Daily  . insulin aspart  0-9 Units Subcutaneous Q4H  . mouth rinse  15 mL Mouth Rinse 10 times per day  . pantoprazole (PROTONIX) IV  40 mg Intravenous Daily  . sodium chloride flush  10-40 mL Intracatheter Q12H    have reviewed scheduled and prn medications.  Physical Exam: General: Sedated and intubated, not responsive Heart:RRR, s1s2 nl, no rubs Lungs: Coarse breath sound bilateral, no wheezing Abdomen:soft, non-distended Extremities: Right BKA Dialysis Access: IJ temporary catheter site clean   Prasad  07/21/2018,9:58 AM  LOS: 2 days

## 2018-07-21 NOTE — Progress Notes (Signed)
Updated daughter via phone.  All questions answered.  She will be calling back for regular updates.

## 2018-07-22 ENCOUNTER — Inpatient Hospital Stay (HOSPITAL_COMMUNITY): Payer: Medicare Other

## 2018-07-22 DIAGNOSIS — Z515 Encounter for palliative care: Secondary | ICD-10-CM

## 2018-07-22 DIAGNOSIS — A419 Sepsis, unspecified organism: Secondary | ICD-10-CM

## 2018-07-22 DIAGNOSIS — R6521 Severe sepsis with septic shock: Secondary | ICD-10-CM

## 2018-07-22 LAB — COMPREHENSIVE METABOLIC PANEL
ALT: 94 U/L — ABNORMAL HIGH (ref 0–44)
AST: 72 U/L — ABNORMAL HIGH (ref 15–41)
Albumin: 1.9 g/dL — ABNORMAL LOW (ref 3.5–5.0)
Alkaline Phosphatase: 92 U/L (ref 38–126)
Anion gap: 13 (ref 5–15)
BUN: 13 mg/dL (ref 8–23)
CO2: 22 mmol/L (ref 22–32)
Calcium: 7.4 mg/dL — ABNORMAL LOW (ref 8.9–10.3)
Chloride: 98 mmol/L (ref 98–111)
Creatinine, Ser: 1.64 mg/dL — ABNORMAL HIGH (ref 0.44–1.00)
GFR calc Af Amer: 37 mL/min — ABNORMAL LOW (ref >60)
GFR calc non Af Amer: 32 mL/min — ABNORMAL LOW (ref >60)
Glucose, Bld: 182 mg/dL — ABNORMAL HIGH (ref 70–99)
Potassium: 4.8 mmol/L (ref 3.5–5.1)
Sodium: 133 mmol/L — ABNORMAL LOW (ref 135–145)
Total Bilirubin: 0.4 mg/dL (ref 0.3–1.2)
Total Protein: 4.8 g/dL — ABNORMAL LOW (ref 6.5–8.1)

## 2018-07-22 LAB — RENAL FUNCTION PANEL
Albumin: 1.8 g/dL — ABNORMAL LOW (ref 3.5–5.0)
Anion gap: 7 (ref 5–15)
BUN: 12 mg/dL (ref 8–23)
CO2: 22 mmol/L (ref 22–32)
Calcium: 7.5 mg/dL — ABNORMAL LOW (ref 8.9–10.3)
Chloride: 103 mmol/L (ref 98–111)
Creatinine, Ser: 1.4 mg/dL — ABNORMAL HIGH (ref 0.44–1.00)
GFR calc Af Amer: 44 mL/min — ABNORMAL LOW (ref >60)
GFR calc non Af Amer: 38 mL/min — ABNORMAL LOW (ref >60)
Glucose, Bld: 156 mg/dL — ABNORMAL HIGH (ref 70–99)
Phosphorus: 2.1 mg/dL — ABNORMAL LOW (ref 2.5–4.6)
Potassium: 4.7 mmol/L (ref 3.5–5.1)
Sodium: 132 mmol/L — ABNORMAL LOW (ref 135–145)

## 2018-07-22 LAB — CBC
HCT: 27.2 % — ABNORMAL LOW (ref 36.0–46.0)
Hemoglobin: 8.3 g/dL — ABNORMAL LOW (ref 12.0–15.0)
MCH: 27.5 pg (ref 26.0–34.0)
MCHC: 30.5 g/dL (ref 30.0–36.0)
MCV: 90.1 fL (ref 80.0–100.0)
Platelets: 92 10*3/uL — ABNORMAL LOW (ref 150–400)
RBC: 3.02 MIL/uL — ABNORMAL LOW (ref 3.87–5.11)
RDW: 14.8 % (ref 11.5–15.5)
WBC: 6.5 10*3/uL (ref 4.0–10.5)
nRBC: 0 % (ref 0.0–0.2)

## 2018-07-22 LAB — GLUCOSE, CAPILLARY
Glucose-Capillary: 127 mg/dL — ABNORMAL HIGH (ref 70–99)
Glucose-Capillary: 129 mg/dL — ABNORMAL HIGH (ref 70–99)
Glucose-Capillary: 151 mg/dL — ABNORMAL HIGH (ref 70–99)
Glucose-Capillary: 159 mg/dL — ABNORMAL HIGH (ref 70–99)
Glucose-Capillary: 174 mg/dL — ABNORMAL HIGH (ref 70–99)
Glucose-Capillary: 175 mg/dL — ABNORMAL HIGH (ref 70–99)

## 2018-07-22 LAB — APTT: aPTT: 121 seconds — ABNORMAL HIGH (ref 24–36)

## 2018-07-22 LAB — HEPARIN LEVEL (UNFRACTIONATED)
Heparin Unfractionated: 0.44 IU/mL (ref 0.30–0.70)
Heparin Unfractionated: 0.34 IU/mL (ref 0.30–0.70)

## 2018-07-22 LAB — MAGNESIUM: Magnesium: 2.2 mg/dL (ref 1.7–2.4)

## 2018-07-22 MED ORDER — VANCOMYCIN HCL 500 MG IV SOLR
500.0000 mg | INTRAVENOUS | Status: DC
  Administered 2018-07-23: 500 mg via INTRAVENOUS
  Filled 2018-07-22 (×3): qty 500

## 2018-07-22 MED ORDER — VANCOMYCIN HCL 10 G IV SOLR
1250.0000 mg | Freq: Once | INTRAVENOUS | Status: AC
  Administered 2018-07-22: 1250 mg via INTRAVENOUS
  Filled 2018-07-22: qty 1250

## 2018-07-22 MED ORDER — SODIUM CHLORIDE 0.9 % IV SOLN
2.0000 g | Freq: Two times a day (BID) | INTRAVENOUS | Status: DC
  Administered 2018-07-22 – 2018-07-23 (×3): 2 g via INTRAVENOUS
  Filled 2018-07-22 (×6): qty 2

## 2018-07-22 MED ORDER — SODIUM CHLORIDE 0.9 % IV SOLN
1.0000 g | Freq: Three times a day (TID) | INTRAVENOUS | Status: DC
  Administered 2018-07-22: 10:00:00 1 g via INTRAVENOUS
  Filled 2018-07-22 (×3): qty 1

## 2018-07-22 MED ORDER — SODIUM CHLORIDE 0.9 % IV SOLN
1.0000 g | Freq: Once | INTRAVENOUS | Status: AC
  Administered 2018-07-22: 1 g via INTRAVENOUS
  Filled 2018-07-22: qty 1

## 2018-07-22 MED ORDER — VANCOMYCIN HCL IN DEXTROSE 1-5 GM/200ML-% IV SOLN: 1000.0000 mg | INTRAVENOUS | Status: DC

## 2018-07-22 NOTE — Progress Notes (Addendum)
ANTICOAGULATION CONSULT NOTE - Kinder for heparin Indication: chest pain/ACS  Allergies  Allergen Reactions  . Penicillins Anaphylaxis and Other (See Comments)    Did it involve swelling of the face/tongue/throat, SOB, or low BP? Yes Did it involve sudden or severe rash/hives, skin peeling, or any reaction on the inside of your mouth or nose? Yes Did you need to seek medical attention at a hospital or doctor's office? Yes When did it last happen?childhood If all above answers are "NO", may proceed with cephalosporin use.   . Tape Itching and Dermatitis    Patient Measurements: Height: 4' 11" (149.9 cm) Weight: 131 lb 13.4 oz (59.8 kg)(CRRT tubing in bed & bair hugger on pt ) IBW/kg (Calculated) : 43.2  Vital Signs: Temp: 98.6 F (37 C) (05/06 0000) BP: 104/39 (05/06 0000) Pulse Rate: 65 (05/06 0000)  Labs: Recent Labs    07/21/2018 2131  07/20/18 0327 07/20/18 0446 07/20/18 1048  07/20/18 1720 07/20/18 2126  07/21/18 0315 07/21/18 1229 07/21/18 1603 07/22/18 0102  HGB 11.2*   < > 11.9* 10.7*  --   --   --   --   --  10.2*  --   --   --   HCT 38.5   < > 35.0* 34.6*  --   --   --   --   --  31.2*  --   --   --   PLT 196  --   --  208  --   --   --   --   --  118*  --   --   --   APTT  --   --   --   --   --   --   --   --   --  >200*  --   --   --   LABPROT 15.1  --   --   --   --   --   --   --   --   --   --   --   --   INR 1.2  --   --   --   --   --   --   --   --   --   --   --   --   HEPARINUNFRC  --   --   --   --   --    < >  --   --    < > 1.10* 0.98*  --  0.44  CREATININE 4.41*   < >  --  4.20*  --   --   --  3.57*  --  2.80*  --  1.91*  --   TROPONINI 0.03*  --   --  2.07* 2.11*  --  2.08* 1.35*  --   --   --   --   --    < > = values in this interval not displayed.    Estimated Creatinine Clearance: 21.9 mL/min (A) (by C-G formula based on SCr of 1.91 mg/dL (H)).   Medical History: Past Medical History:  Diagnosis  Date  . CKD (chronic kidney disease) stage 4, GFR 15-29 ml/min (HCC)   . COPD (chronic obstructive pulmonary disease) ([place])   . Diabetes ([place])   . Hx of CABG   . Hypertension   . Stroke [facility])     Medications:  Medications Prior to Admission  Medication Sig Dispense Refill Last Dose  .  albuterol (PROVENTIL) (2.5 MG/3ML) 0.083% nebulizer solution Take 2.5 mg by nebulization 3 (three) times daily as needed for wheezing or shortness of breath.   UNK  . albuterol (VENTOLIN HFA) 108 (90 Base) MCG/ACT inhaler Inhale 1-2 puffs into the lungs every 6 (six) hours as needed for wheezing or shortness of breath.   UNK  . ALPRAZolam (XANAX) 1 MG tablet Take 1 mg by mouth 3 (three) times daily as needed for anxiety.   UNK  . amLODipine (NORVASC) 5 MG tablet Take 5 mg by mouth daily.   UNK  . aspirin EC 81 MG tablet Take 81 mg by mouth daily.   UNK  . atorvastatin (LIPITOR) 40 MG tablet Take 40 mg by mouth daily.   UNK  . carvedilol (COREG) 25 MG tablet Take 25 mg by mouth 2 (two) times daily with a meal.   UNK  . clopidogrel (PLAVIX) 75 MG tablet Take 75 mg by mouth daily.   UNK  . Cyanocobalamin 1000 MCG/ML KIT Inject 1 mL as directed every 30 (thirty) minutes.   07/11/2018  . DULoxetine (CYMBALTA) 30 MG capsule Take 30 mg by mouth daily.   UNK  . gabapentin (NEURONTIN) 300 MG capsule Take 300 mg by mouth at bedtime.   UNK  . isosorbide mononitrate (IMDUR) 30 MG 24 hr tablet Take 30 mg by mouth daily.   UNK  . oxyCODONE-acetaminophen (PERCOCET) 10-325 MG tablet Take 1 tablet by mouth every 6 (six) hours as needed for pain.   UNK   Scheduled:  . amiodarone  150 mg Intravenous Once  . aspirin  300 mg Rectal Daily  . chlorhexidine gluconate (MEDLINE KIT)  15 mL Mouth Rinse BID  . Chlorhexidine Gluconate Cloth  6 each Topical Daily  . insulin aspart  0-9 Units Subcutaneous Q4H  . mouth rinse  15 mL Mouth Rinse 10 times per day  . pantoprazole (PROTONIX) IV  40 mg Intravenous Daily  . sodium chloride  flush  10-40 mL Intracatheter Q12H   Infusions:  .  prismasol BGK 4/2.5 1 each (07/21/18 1104)  .  prismasol BGK 4/2.5 1 each (07/21/18 1420)  . amiodarone 30 mg/hr (07/22/18 0023)  . epinephrine 4 mcg/min (07/22/18 0000)  . feeding supplement (VITAL AF 1.2 CAL) 20 mL/hr at 07/21/18 1130  . fentaNYL infusion INTRAVENOUS 50 mcg/hr (07/22/18 0000)  . heparin 500 Units/hr (07/22/18 0000)  . levETIRAcetam Stopped (07/21/18 2126)  . midazolam 20 mg/hr (07/22/18 0000)  . norepinephrine (LEVOPHED) Adult infusion 14 mcg/min (07/22/18 0000)  . prismasol BGK 4/2.5 1,500 mL/hr at 07/21/18 2340    Assessment: 68yo female presented as code STEMI after PEA/Vfib arrests, was not brought to cath lab given risks > benefit, now to start heparin.  Heparin level therapeutic at 0.44 s/p holding and decreasing rate  Goal of Therapy:  Heparin level 0.3-0.7 units/ml Monitor platelets by anticoagulation protocol: Yes   Plan:  Continue heparin gtt at 500 units/hr F/u heparin level with AM labs  Bertis Ruddy, PharmD Clinical Pharmacist Please check AMION for all [place] numbers 07/22/2018 1:35 AM    Addendum Heparin level this AM therapeutic at 0.34 on 500 units/hr. Hgb down to 8.3, plts 92, no bleeding or infusion related issues per RN.   Plan Continue heparin at 500 units/hr Monitor daily heparin level, CBC, s/sx bleeding  Juanell Fairly, PharmD PGY1 Pharmacy Resident Phone [phone] 07/22/2018 10:22 AM

## 2018-07-22 NOTE — Progress Notes (Signed)
NAME:  Rebecca Benton, MRN:  [id number], DOB:  07-09-1950, LOS: 3 ADMISSION DATE:  07/23/2018, CONSULTATION DATE:  07/30/2018 REFERRING MD:  Dr. Regenia Skeeter, CHIEF COMPLAINT:  Cardiac arrest   Brief History   68 year old female presenting from home with witnessed prolonged cardiac arrest. Found in PEA by EMS. In the ED, had second PEA arrest. Likely primary cardiac event, however given her poor neurological function, she is not a candidate for intervention. Remains on multiple pressors.   Past Medical History  COPD, CKD stage 4, DM, CAD s/p CABG 2016, HTN, stroke, R BKA 2017  Significant Hospital Events   5/3 Admit/ cardiac arrest   Consults:  Cardiology Neuro Renal Palliative  Procedures:  5/3 ETT >> 5/4 Vas cath >> Significant Diagnostic Tests:  5/4 [facility] >> Old left frontal infarct. No intracranial abnormality  Micro Data:  5/3 BCx 2 >>  NGTD, pending 5/3 UC >> Neg 5/3 trach asp >> Abundant GPR, few GPC 5/3 COVID >> neg  Antimicrobials:   Interim history/subjective:  Hypothermic. Epi gtt weaned to 3. Remains on sedation and levo and vaso. Did not tolerate SBT this am due to low MV (low RR)  Objective   Blood pressure (!) 104/46, pulse 64, temperature (!) 97.5 F (36.4 C), temperature source Core, resp. rate 19, height 4\' 11"  (1.499 m), weight 58 kg, SpO2 99 %. CVP:  [0 mmHg-5 mmHg] 1 mmHg  Vent Mode: PRVC FiO2 (%):  [40 %-50 %] 40 % Set Rate:  [22 bmp] 22 bmp Vt Set:  [340 mL] 340 mL PEEP:  [5 cmH20] 5 cmH20 Plateau Pressure:  [13 cmH20-21 cmH20] 20 cmH20   Intake/Output Summary (Last 24 hours) at 07/22/2018 0803 Last data filed at 07/22/2018 0700 Gross per 24 hour  Intake 2860.33 ml  Output 3742 ml  Net -881.67 ml   Filed Weights   07/20/18 0215 07/21/18 0407 07/22/18 0435  Weight: 56.8 kg 59.8 kg 58 kg   Physical Exam: General: Critically ill-appearing, sedated, intermittent myoclonus HENT: [place], AT, ETT in place Eyes: 79mm pupils, non-reactive, EOMI, no scleral  icterus Respiratory: Clear to auscultation bilaterally.  No crackles, wheezing or rales Cardiovascular: RRR, -M/R/G, no JVD GI: BS+, soft, nontender Extremities:R BKA, no edema, no tenderness Neuro: Sedated  Resolved Hospital Problem list   Shock liver secondary to cardiac arrest  Assessment & Plan:  68 year old female with past history of COPD, CKD stage 4, DM, CAD s/p CABG 2016, HTN, stroke, and R BKA 2017 presenting from home with cardiac arrest.    Acute encephalopathy Myoclonus Concerned for anoxic brain injury CT with NAICA EEG with GPEDs Precludes ability to liberate from ventilator P: Neuro checks Keppra, Versed and Fentanyl for myoclonus Appreciate Neurology recs. Will hold Versed this am for neuro exam MRI without contrast Palliative Care Consult placed  Acute respiratory failure COPD On minimal vent settings. Unable to extubate d/t neuro status Failed SBT due to low MV GPR/GPC in trach aspirate P:  Full vent support VAP bundle Duonebs q6 prn  Start Vanc   Septic Shock GPR/GPC in trach aspirate P: Start broad spectrum antibiotics F/u cultures Pressor support for MAPs >60  Cardiac arrest Cardiogenic shock  Acute congestive heart failure Initial prolonged PEA/ then VT. Likely cardiac etiology Troponin peaked 2.11 TTE with reduced EF 35-50% and impaired relaxation Unfortunately, due to her poor neurologic exam and prolonged arrest is not a candidate for interventional workup per Cardiology P:  Defer TTM due to prolonged arrest Telemetry  Wean epi and levo for SBP goal >90 Amio gtt Heparin gtt Daily ASA Goal K >4 and Mg >2  AKI P:  CRRT per nephrology Trend BMP / urinary output Replace electrolytes as indicated Avoid nephrotoxic agents, ensure adequate renal perfusion  DM Hyperglycemia/Hypoglycemia P:  DC D5 gtt SSI sensitive scale CBG q 4  Best practice:  Diet: TF Pain/Anxiety/Delirium protocol (if indicated): as above for myoclonus  VAP protocol (if indicated): yes DVT prophylaxis: heparin gtt GI prophylaxis: PPI Glucose control: SSI  Mobility: BR Code Status: Remain full code Family Communication:  -Updated daughter Jolanda on 5/5. She is aware of Palliative consult. -Daughter Jolanda [phone]), 2nd contact/family Modena Slater [phone].  Jolanda ask that no information be given out other than these 2 listed.  Disposition: ICU  Labs   CBC: Recent Labs  Lab 08/06/2018 2131  08/03/2018 2144 07/20/18 0327 07/20/18 0446 07/21/18 0315 07/22/18 0400  WBC 16.3*  --   --   --  14.0* 10.8* 6.5  NEUTROABS 9.4*  --   --   --   --   --   --   HGB 11.2*   < > 11.9* 11.9* 10.7* 10.2* 8.3*  HCT 38.5   < > 35.0* 35.0* 34.6* 31.2* 27.2*  MCV 96.0  --   --   --  90.8 87.9 90.1  PLT 196  --   --   --  208 118* 92*   < > = values in this interval not displayed.    Basic Metabolic Panel: Recent Labs  Lab 07/20/18 0446 07/20/18 2126 07/21/18 0315 07/21/18 1603 07/22/18 0400  NA 135 137 134* 132* 133*  K 5.7* 4.6 4.9 5.0 4.8  CL 106 106 101 99 98  CO2 17* 19* 22 24 22   GLUCOSE 476* 44* 120* 162* 182*  BUN 57* 45* 31* 18 13  CREATININE 4.20* 3.57* 2.80* 1.91* 1.64*  CALCIUM 9.9 8.1* 7.7* 7.4* 7.4*  MG 2.4  --  2.1  --  2.2  PHOS 5.3* 4.5 2.9 2.3*  --    GFR: Estimated Creatinine Clearance: 25.1 mL/min (A) (by C-G formula based on SCr of 1.64 mg/dL (H)). Recent Labs  Lab 07/29/2018 2131 08/02/2018 2248 07/20/18 0446 07/20/18 0951 07/20/18 1251 07/21/18 0315 07/22/18 0400  WBC 16.3*  --  14.0*  --   --  10.8* 6.5  LATICACIDVEN  --  5.6*  --  2.3* 2.4*  --   --     Liver Function Tests: Recent Labs  Lab 07/22/2018 2131 07/20/18 0446 07/20/18 2126 07/21/18 0315 07/21/18 1603 07/22/18 0400  AST 102* 412*  --  129*  --  72*  ALT 82* 222*  --  139*  --  94*  ALKPHOS 100 87  --  78  --  92  BILITOT 0.6 1.0  --  0.6  --  0.4  PROT 6.0* 5.1*  --  4.7*  --  4.8*  ALBUMIN 2.8* 2.4* 2.3* 2.2*  2.1* 2.0*  1.9*   No results for input(s): LIPASE, AMYLASE in the last 168 hours. No results for input(s): AMMONIA in the last 168 hours.  ABG    Component Value Date/Time   PHART 7.395 07/20/2018 0327   PCO2ART 34.9 07/20/2018 0327   PO2ART 421.0 (H) 07/20/2018 0327   HCO3 22.2 07/20/2018 0327   TCO2 23 07/20/2018 0327   ACIDBASEDEF 3.0 (H) 07/20/2018 0327   O2SAT 76.1 07/20/2018 0440     Coagulation Profile: Recent Labs  Lab 08/01/2018  2131  INR 1.2    Cardiac Enzymes: Recent Labs  Lab 07/21/2018 2131 07/20/18 0446 07/20/18 1048 07/20/18 1720 07/20/18 2126  TROPONINI 0.03* 2.07* 2.11* 2.08* 1.35*    HbA1C: No results found for: HGBA1C  CBG: Recent Labs  Lab 07/21/18 1607 07/21/18 1953 07/21/18 2331 07/22/18 0407 07/22/18 0732  GLUCAP 158* 115* 133* 175* 174*    Critical care time: 48 min    The patient is critically ill with multiple organ systems failure and requires high complexity decision making for assessment and support, frequent evaluation and titration of therapies, application of advanced monitoring technologies and extensive interpretation of multiple databases.   Critical Care Time devoted to patient care services described in this note is 48 Minutes. This time reflects time of care of this signee Dr. Rodman Pickle. This critical care time does not reflect procedure time, or teaching time or supervisory time of PA/NP/Med student/Med Resident etc but could involve care discussion time. Discussed with Renal and Pharmacy team.  Rodman Pickle, M.D. [facility] Pulmonary/Critical Care Medicine Pager: [phone] After hours pager: [phone]

## 2018-07-22 NOTE — Consult Note (Addendum)
Consultation Note Date: 07/22/2018   Patient Name: Rebecca Benton  DOB: 22-Oct-1950  MRN: [id number]  Age / Sex: 68 y.o., female  PCP: Lin Landsman, MD Referring Physician: Shellia Cleverly, MD  Reason for Consultation: Establishing goals of care  HPI/Patient Profile: 68 y.o. female  with past medical history of COPD, CKD stage 4, diabetes, CAD s/p CABG 2016, HTN, stroke, right BKA 2017 admitted on 08/05/2018 with cardiac PEA arrest and family initiated CPR at home with ROSC 16 min and unfortunately suffered a second arrest in ED with ROSC after 21 min. Neurologic exam is poor s/p arrest. Also requiring CRRT. Prognosis grim with multi-organ failure and significant co-morbidities prior to arrest.   Clinical Assessment and Goals of Care: I was able to have a conversation with daughter, Rebecca Benton (not ideal as she was in dialysis at the time). We discussed poor prognosis with multiorgan failure and poor neurological exam. She is struggling and wants to remain hopeful. She alludes to a past experience when everyone wanted to make her mother comfortable and said she would never improve from her stroke but she did. She is hoping that they will have another miracle.   I attempted to explain and encourage consideration of DNR but continuing current care accepting that if she were to cardiac arrest again then it would be God's will. Also accepting that her mother's wishes were to "do everything" and that we have done everything including CPR - twice - and if she were to code again we should not put her through resuscitation again. Rebecca Benton is not accepting of this at this time.   I will continue conversations with [facility] tomorrow. She is also awaiting results from MRI planned for today.   Primary Decision Maker NEXT OF KIN daughter Rebecca Benton    SUMMARY OF RECOMMENDATIONS   - Family insists on full aggressive care but with  anticipatory grief  Code Status/Advance Care Planning:  Full code - strongly urged consideration of DNR   Symptom Management:   Per PCCM, renal, neuro.   Palliative Prophylaxis:   Bowel Regimen, Frequent Pain Assessment, Oral Care and Turn Reposition  Additional Recommendations (Limitations, Scope, Preferences):  Full Scope Treatment  Psycho-social/Spiritual:   Desire for further Chaplaincy support:yes  Additional Recommendations: Grief/Bereavement Support  Prognosis:   Prognosis extremely poor and unlikely to survive hospitalization.   Discharge Planning: To Be Determined      Primary Diagnoses: Present on Admission: . Cardiac arrest ([place])   I have reviewed the medical record, interviewed the patient and family, and examined the patient. The following aspects are pertinent.  Past Medical History:  Diagnosis Date  . CKD (chronic kidney disease) stage 4, GFR 15-29 ml/min (HCC)   . COPD (chronic obstructive pulmonary disease) ([place])   . Diabetes ([place])   . Hx of CABG   . Hypertension   . Stroke [facility])    Social History   Socioeconomic History  . Marital status: Widowed    Spouse name: Not on file  . Number of children: Not  on file  . Years of education: Not on file  . Highest education level: Not on file  Occupational History  . Not on file  Social Needs  . Financial resource strain: Not on file  . Food insecurity:    Worry: Not on file    Inability: Not on file  . Transportation needs:    Medical: Not on file    Non-medical: Not on file  Tobacco Use  . Smoking status: Former Research scientist (life sciences)  . Smokeless tobacco: Never Used  Substance and Sexual Activity  . Alcohol use: Not Currently  . Drug use: Not Currently  . Sexual activity: Not on file  Lifestyle  . Physical activity:    Days per week: Not on file    Minutes per session: Not on file  . Stress: Not on file  Relationships  . Social connections:    Talks on phone: Not on file    Gets together:  Not on file    Attends religious service: Not on file    Active member of club or organization: Not on file    Attends meetings of clubs or organizations: Not on file    Relationship status: Not on file  Other Topics Concern  . Not on file  Social History Narrative  . Not on file   Family History  Problem Relation Age of Onset  . Heart disease Mother   . Diabetes Mother   . Heart disease Father   . Diabetes Father    Scheduled Meds: . amiodarone  150 mg Intravenous Once  . aspirin  300 mg Rectal Daily  . chlorhexidine gluconate (MEDLINE KIT)  15 mL Mouth Rinse BID  . Chlorhexidine Gluconate Cloth  6 each Topical Daily  . insulin aspart  0-9 Units Subcutaneous Q4H  . mouth rinse  15 mL Mouth Rinse 10 times per day  . pantoprazole (PROTONIX) IV  40 mg Intravenous Daily  . sodium chloride flush  10-40 mL Intracatheter Q12H   Continuous Infusions: .  prismasol BGK 4/2.5 300 mL/hr at 07/22/18 0422  .  prismasol BGK 4/2.5 1 each (07/21/18 1420)  . amiodarone 30 mg/hr (07/22/18 0900)  . aztreonam    . epinephrine 2 mcg/min (07/22/18 0900)  . feeding supplement (VITAL AF 1.2 CAL) 20 mL/hr at 07/22/18 0200  . fentaNYL infusion INTRAVENOUS 50 mcg/hr (07/22/18 0900)  . heparin 500 Units/hr (07/22/18 0900)  . levETIRAcetam 750 mg (07/22/18 0927)  . midazolam Stopped (07/22/18 0814)  . norepinephrine (LEVOPHED) Adult infusion 8 mcg/min (07/22/18 0900)  . prismasol BGK 4/2.5 1,500 mL/hr at 07/22/18 4401  . vancomycin    . [START ON Jul 31, 2018] vancomycin     PRN Meds:.fentaNYL, heparin, hydrALAZINE, ipratropium-albuterol, midazolam, sodium chloride flush Allergies  Allergen Reactions  . Penicillins Anaphylaxis and Other (See Comments)    Did it involve swelling of the face/tongue/throat, SOB, or low BP? Yes Did it involve sudden or severe rash/hives, skin peeling, or any reaction on the inside of your mouth or nose? Yes Did you need to seek medical attention at a hospital or doctor's  office? Yes When did it last happen?childhood If all above answers are "NO", may proceed with cephalosporin use.   . Tape Itching and Dermatitis   Review of Systems  Unable to perform ROS: Acuity of condition    Physical Exam Vitals signs and nursing note reviewed.  Constitutional:      General: She is not in acute distress.    Appearance: She is ill-appearing.  Interventions: She is intubated.  Cardiovascular:     Rate and Rhythm: Normal rate.  Pulmonary:     Effort: She is intubated.  Abdominal:     Palpations: Abdomen is soft.  Neurological:     Mental Status: She is unresponsive.     Vital Signs: BP (!) 102/51   Pulse 64   Temp (!) 97 F (36.1 C)   Resp 19   Ht '4\' 11"'$  (1.499 m)   Wt 58 kg   SpO2 99%   BMI 25.83 kg/m  Pain Scale: CPOT       SpO2: SpO2: 99 % O2 Device:SpO2: 99 % O2 Flow Rate: .   IO: Intake/output summary:   Intake/Output Summary (Last 24 hours) at 07/22/2018 0931 Last data filed at 07/22/2018 0900 Gross per 24 hour  Intake 2885.66 ml  Output 3845 ml  Net -959.34 ml    LBM: Last BM Date: (PTA) Baseline Weight: Weight: 56.7 kg Most recent weight: Weight: 58 kg     Palliative Assessment/Data:     Time In/Out: 5521-7471, 5953-9672 Time Total: 50 min Greater than 50%  of this time was spent counseling and coordinating care related to the above assessment and plan.  Signed by: Vinie Sill, NP Palliative Medicine Team Pager # [phone] (M-F 8a-5p) Team Phone # [phone] (Nights/Weekends)

## 2018-07-22 NOTE — Progress Notes (Signed)
Palliative:  Full note to follow. I was able to have a conversation with daughter, Sydnee Levans (not ideal as she was in dialysis at the time). We discussed poor prognosis with multiorgan failure and poor neurological exam. She is struggling and wants to remain hopeful. She alludes to a past experience when everyone wanted to make her mother comfortable and said she would never improve from her stroke but she did. She is hoping that they will have another miracle.   I attempted to explain and encourage consideration of DNR but continuing current care accepting that if she were to cardiac arrest again then it would be God's will. Also accepting that her mother's wishes were to "do everything" and that we have done everything including CPR - twice - and if she were to code again we should not put her through resuscitation again. Sydnee Levans is not accepting of this at this time.   I will continue conversations with [facility] tomorrow. She is also awaiting results from MRI planned for today.   Vinie Sill, NP Palliative Medicine Team Pager # [phone] (M-F 8a-5p) Team Phone # [phone] (Nights/Weekends)

## 2018-07-22 NOTE — Progress Notes (Addendum)
Pharmacy Antibiotic Note  Rebecca Benton is a 68 y.o. female admitted on 07/21/2018 with cardiac arrest.  Trach aspirate culture now growing GPCs and gram positive rods. Pharmacy has been consulted for aztreonam and vancomycin dosing. No history of patient receiving PCN and has severe allergy listed in chart. SCR 1.64, WBC WNL, afebrile, requiring pressor support. BCX no growth x3 days. On CRRT  Plan: Vanc loading dose 1250mg  x1   Followed by vanc 500mg  IV Q24 hrs  Aztreonam 2g IV Q12hrs Follow cultures, clinical s/sx improvement, de-escalation of abx, LOT  Height: 4\' 11"  (149.9 cm) Weight: 127 lb 13.9 oz (58 kg) IBW/kg (Calculated) : 43.2  Temp (24hrs), Avg:97.7 F (36.5 C), Min:91.2 F (32.9 C), Max:99 F (37.2 C)  Recent Labs  Lab 07/28/2018 2131  07/22/2018 2248 07/20/18 0446 07/20/18 0951 07/20/18 1251 07/20/18 2126 07/21/18 0315 07/21/18 1603 07/22/18 0400  WBC 16.3*  --   --  14.0*  --   --   --  10.8*  --  6.5  CREATININE 4.41*   < >  --  4.20*  --   --  3.57* 2.80* 1.91* 1.64*  LATICACIDVEN  --   --  5.6*  --  2.3* 2.4*  --   --   --   --    < > = values in this interval not displayed.    Estimated Creatinine Clearance: 25.1 mL/min (A) (by C-G formula based on SCr of 1.64 mg/dL (H)).    Allergies  Allergen Reactions  . Penicillins Anaphylaxis and Other (See Comments)    Did it involve swelling of the face/tongue/throat, SOB, or low BP? Yes Did it involve sudden or severe rash/hives, skin peeling, or any reaction on the inside of your mouth or nose? Yes Did you need to seek medical attention at a hospital or doctor's office? Yes When did it last happen?childhood If all above answers are "NO", may proceed with cephalosporin use.   . Tape Itching and Dermatitis    Antimicrobials this admission: Vanc 5/6>> Aztreonam 5/6>>  Dose adjustments this admission:   Microbiology results: 5/5 TA GPC>> 5/4 UCX>> negative  MRSA PCR neg COVID neg  Thank you for  allowing pharmacy to be a part of this patient's care.  Juanell Fairly, PharmD PGY1 Pharmacy Resident 07/22/2018 9:12 AM

## 2018-07-22 NOTE — Progress Notes (Signed)
[facility] NEPHROLOGY PROGRESS NOTE  Assessment/ Plan: Pt is a 68 y.o. yo female with history of hypertension, CAD status post CABG, stroke, right BKA, COPD, CKD stage IV per problem list presented with weakness cardiac arrest twice, consulted for AKI.  Started CRRT on 5/4.  #Acute kidney injury on CKD likely ischemic ATN due to cardiogenic shock/cardiac arrest, anuric.  Patient likely with multiorgan failure including anoxic brain injury.  After lengthy discussion with the patient's daughter, started CRRT on 5/4 evening.  I do not think patient is a candidate for outpatient treatment.  On heparin for anticoagulation, 4K, start 50 cc an hour ultrafiltration as tolerated.  Recommend palliative care consult. -Remains anuric, potassium acceptable.  Discussed with ICU team.  Plan for MRI and holding sedation today noted.  We will continue CRRT.  #Cardiogenic shock/cardiac arrest: On epinephrine, Levophed.  Arterial blood pressure acceptable at this time.  Cardiology following.  #Myoclonic jerk/possible anoxic brain injury: Seen by neurology likely diffuse anoxic brain injury.  He started Keppra and Versed for myoclonic jerk.  #Hyperkalemia: Improved.  Monitor labs.  #Lactic acidosis.  #Poor prognosis.  Subjective: Seen and examined in ICU.  New event.  Remained sedated and intubated.  No urine output.  Tolerating CRRT.  On pressors.   Objective Vital signs in last 24 hours: Vitals:   07/22/18 0845 07/22/18 0900 07/22/18 0915 07/22/18 0930  BP:  (!) 102/51    Pulse: 65 65 65 64  Resp: (!) 22 (!) 22 (!) 24 19  Temp: (!) 97.3 F (36.3 C) (!) 97.2 F (36.2 C) (!) 97.2 F (36.2 C) (!) 97 F (36.1 C)  TempSrc:      SpO2: 99% 99% 100% 99%  Weight:      Height:       Weight change: -1.8 kg  Intake/Output Summary (Last 24 hours) at 07/22/2018 0959 Last data filed at 07/22/2018 0900 Gross per 24 hour  Intake 2885.66 ml  Output 3845 ml  Net -959.34 ml        Labs: Basic Metabolic Panel: Recent Labs  Lab 07/20/18 2126 07/21/18 0315 07/21/18 1603 07/22/18 0400  NA 137 134* 132* 133*  K 4.6 4.9 5.0 4.8  CL 106 101 99 98  CO2 19* '22 24 22  '$ GLUCOSE 44* 120* 162* 182*  BUN 45* 31* 18 13  CREATININE 3.57* 2.80* 1.91* 1.64*  CALCIUM 8.1* 7.7* 7.4* 7.4*  PHOS 4.5 2.9 2.3*  --    Liver Function Tests: Recent Labs  Lab 07/20/18 0446  07/21/18 0315 07/21/18 1603 07/22/18 0400  AST 412*  --  129*  --  72*  ALT 222*  --  139*  --  94*  ALKPHOS 87  --  78  --  92  BILITOT 1.0  --  0.6  --  0.4  PROT 5.1*  --  4.7*  --  4.8*  ALBUMIN 2.4*   < > 2.2*  2.1* 2.0* 1.9*   < > = values in this interval not displayed.   No results for input(s): LIPASE, AMYLASE in the last 168 hours. No results for input(s): AMMONIA in the last 168 hours. CBC: Recent Labs  Lab 08/15/2018 2131  07/20/18 0446 07/21/18 0315 07/22/18 0400  WBC 16.3*  --  14.0* 10.8* 6.5  NEUTROABS 9.4*  --   --   --   --   HGB 11.2*   < > 10.7* 10.2* 8.3*  HCT 38.5   < > 34.6* 31.2* 27.2*  MCV 96.0  --  90.8 87.9 90.1  PLT 196  --  208 118* 92*   < > = values in this interval not displayed.   Cardiac Enzymes: Recent Labs  Lab 08/02/2018 2131 07/20/18 0446 07/20/18 1048 07/20/18 1720 07/20/18 2126  TROPONINI 0.03* 2.07* 2.11* 2.08* 1.35*   CBG: Recent Labs  Lab 07/21/18 1607 07/21/18 1953 07/21/18 2331 07/22/18 0407 07/22/18 0732  GLUCAP 158* 115* 133* 175* 174*    Iron Studies: No results for input(s): IRON, TIBC, TRANSFERRIN, FERRITIN in the last 72 hours. Studies/Results: US Renal  Result Date: 07/20/2018 CLINICAL DATA:  Acute renal injury EXAM: RENAL / URINARY TRACT ULTRASOUND COMPLETE COMPARISON:  None. FINDINGS: Right Kidney: Renal measurements: 8.9 x 4.8 x 5.7 cm. = volume: 126 mL. Mild increased echogenicity is noted. Two small cysts are identified. The largest of these measures 1.6 cm. No hydronephrosis is noted. Left Kidney: Renal  measurements: 9.9 x 4.7 x 5.1 cm. = volume: 122 mL. Cystic lesion is noted measuring 3.3 cm in greatest dimension in the upper pole of the left kidney. There is some suggestion of mild septation although this may represent 2 adjacent cysts. Mild increased echogenicity is noted. No hydronephrosis is noted. Bladder: Bladder is decompressed by Foley catheter. IMPRESSION: Cystic changes bilaterally. Suggestion of mild septation is noted on the left. Short-term follow-up exam in 6 months is recommended to assess for stability. No hydronephrosis is noted. Electronically Signed   By: Inez Catalina M.D.   On: 07/20/2018 17:29   Dg Chest Port 1 View  Result Date: 07/20/2018 CLINICAL DATA:  Central line placement. EXAM: PORTABLE CHEST 1 VIEW COMPARISON:  Chest x-ray dated 07/20/2018. FINDINGS: The endotracheal tube terminates just above the carina by approximately 2 cm. The newly placed right-sided central venous catheter tip terminates over the mid to distal SVC. The left-sided central venous catheter is unchanged in position. The enteric tube extends below the left hemidiaphragm. Evaluation is somewhat limited by overlying defibrillator pads. There is no pneumothorax. Trace bilateral pleural effusions are noted. IMPRESSION: Newly placed right-sided central venous catheter tip projects over the SVC. The endotracheal tube terminates just above the carina by approximately 2 cm. Otherwise, stable appearance of the chest. No pneumothorax. Electronically Signed   By: Constance Holster M.D.   On: 07/20/2018 16:42    Medications: Infusions: .  prismasol BGK 4/2.5 300 mL/hr at 07/22/18 0422  .  prismasol BGK 4/2.5 1 each (07/21/18 1420)  . amiodarone 30 mg/hr (07/22/18 0900)  . aztreonam 1 g (07/22/18 0946)  . epinephrine 2 mcg/min (07/22/18 0900)  . feeding supplement (VITAL AF 1.2 CAL) 20 mL/hr at 07/22/18 0200  . fentaNYL infusion INTRAVENOUS 50 mcg/hr (07/22/18 0900)  . heparin 500 Units/hr (07/22/18 0900)  .  levETIRAcetam 750 mg (07/22/18 0927)  . midazolam Stopped (07/22/18 0814)  . norepinephrine (LEVOPHED) Adult infusion 8 mcg/min (07/22/18 0900)  . prismasol BGK 4/2.5 1,500 mL/hr at 07/22/18 0942  . vancomycin    . [START ON 08-22-18] vancomycin      Scheduled Medications: . amiodarone  150 mg Intravenous Once  . aspirin  300 mg Rectal Daily  . chlorhexidine gluconate (MEDLINE KIT)  15 mL Mouth Rinse BID  . Chlorhexidine Gluconate Cloth  6 each Topical Daily  . insulin aspart  0-9 Units Subcutaneous Q4H  . mouth rinse  15 mL Mouth Rinse 10 times per day  . pantoprazole (PROTONIX) IV  40 mg Intravenous Daily  . sodium chloride flush  10-40  mL Intracatheter Q12H    have reviewed scheduled and prn medications.  Physical Exam: General: Sedated and intubated, not responsive Heart:RRR, s1s2 nl, no rubs Lungs: Coarse breath sound bilateral, no wheezing or crackle appreciated. Abdomen:soft, non-distended Extremities: Right BKA Dialysis Access: IJ temporary catheter site clean  Rebecca Benton Prasad Pearson Reasons 07/22/2018,9:59 AM  LOS: 3 days

## 2018-07-22 NOTE — Progress Notes (Signed)
   07/22/18 2200  Clinical Encounter Type  Visited With Family  Visit Type Critical Care  Referral From  (Palliative Nurse Practitioner )  Consult/Referral To Chaplain  Spiritual Encounters  Spiritual Needs Emotional;Sacred text  Stress Factors  Family Stress Factors Family relationships;Health changes;Major life changes;Loss;Exhausted   Chaplain received a text from [place], Palliative Nurse Practitioner about calling [name] for support.  Chaplain connected with DTR Rebecca Benton who was very upset about her mother's condition.  She recalled performing CPR on her mother Sunday and how traumatic that was and wondered if she did enough.  DTR is worried about losing her mother and still holding out a little hope she can get better, but experiencing anticipatory grief thinking about life without her.  Chaplain provided empathetic listening and support as well as Scripture and prayer.  Chaplain encouraged DTR to practice some self care and get some rest.  DTR expressed appreciation for the call and support.  Chaplain reminded DTR we are here for her any way we can assist.  DTR and Mother had been attending [employer]. [facility] and [place] had provided some spiritual support in the past.

## 2018-07-22 NOTE — Progress Notes (Signed)
Pt transported on ventilator and back from [facility] to [place] with RN and transport. Pt vital signs stable throughout.

## 2018-07-23 ENCOUNTER — Inpatient Hospital Stay (HOSPITAL_COMMUNITY): Payer: Medicare Other

## 2018-07-23 DIAGNOSIS — Z515 Encounter for palliative care: Secondary | ICD-10-CM

## 2018-07-23 DIAGNOSIS — Z7189 Other specified counseling: Secondary | ICD-10-CM

## 2018-07-23 LAB — COMPREHENSIVE METABOLIC PANEL
ALT: 112 U/L — ABNORMAL HIGH (ref 0–44)
AST: 148 U/L — ABNORMAL HIGH (ref 15–41)
Albumin: 1.6 g/dL — ABNORMAL LOW (ref 3.5–5.0)
Alkaline Phosphatase: 90 U/L (ref 38–126)
Anion gap: 9 (ref 5–15)
BUN: 12 mg/dL (ref 8–23)
CO2: 22 mmol/L (ref 22–32)
Calcium: 7.2 mg/dL — ABNORMAL LOW (ref 8.9–10.3)
Chloride: 101 mmol/L (ref 98–111)
Creatinine, Ser: 1.56 mg/dL — ABNORMAL HIGH (ref 0.44–1.00)
GFR calc Af Amer: 39 mL/min — ABNORMAL LOW (ref >60)
GFR calc non Af Amer: 34 mL/min — ABNORMAL LOW (ref >60)
Glucose, Bld: 158 mg/dL — ABNORMAL HIGH (ref 70–99)
Potassium: 4.9 mmol/L (ref 3.5–5.1)
Sodium: 132 mmol/L — ABNORMAL LOW (ref 135–145)
Total Bilirubin: 0.5 mg/dL (ref 0.3–1.2)
Total Protein: 5 g/dL — ABNORMAL LOW (ref 6.5–8.1)

## 2018-07-23 LAB — RENAL FUNCTION PANEL
Albumin: 2.2 g/dL — ABNORMAL LOW (ref 3.5–5.0)
Anion gap: 10 (ref 5–15)
BUN: 13 mg/dL (ref 8–23)
CO2: 23 mmol/L (ref 22–32)
Calcium: 7.2 mg/dL — ABNORMAL LOW (ref 8.9–10.3)
Chloride: 99 mmol/L (ref 98–111)
Creatinine, Ser: 1.48 mg/dL — ABNORMAL HIGH (ref 0.44–1.00)
GFR calc Af Amer: 41 mL/min — ABNORMAL LOW (ref >60)
GFR calc non Af Amer: 36 mL/min — ABNORMAL LOW (ref >60)
Glucose, Bld: 161 mg/dL — ABNORMAL HIGH (ref 70–99)
Phosphorus: 2.3 mg/dL — ABNORMAL LOW (ref 2.5–4.6)
Potassium: 4.3 mmol/L (ref 3.5–5.1)
Sodium: 132 mmol/L — ABNORMAL LOW (ref 135–145)

## 2018-07-23 LAB — POCT I-STAT 7, (LYTES, BLD GAS, ICA,H+H)
Acid-base deficit: 4 mmol/L — ABNORMAL HIGH (ref 0.0–2.0)
Acid-base deficit: 5 mmol/L — ABNORMAL HIGH (ref 0.0–2.0)
Acid-base deficit: 5 mmol/L — ABNORMAL HIGH (ref 0.0–2.0)
Bicarbonate: 23.7 mmol/L (ref 20.0–28.0)
Bicarbonate: 21.8 mmol/L (ref 20.0–28.0)
Bicarbonate: 21.8 mmol/L (ref 20.0–28.0)
Calcium, Ion: 1.11 mmol/L — ABNORMAL LOW (ref 1.15–1.40)
Calcium, Ion: 1.04 mmol/L — ABNORMAL LOW (ref 1.15–1.40)
Calcium, Ion: 1.02 mmol/L — ABNORMAL LOW (ref 1.15–1.40)
HCT: 31 % — ABNORMAL LOW (ref 36.0–46.0)
HCT: 25 % — ABNORMAL LOW (ref 36.0–46.0)
HCT: 24 % — ABNORMAL LOW (ref 36.0–46.0)
Hemoglobin: 10.5 g/dL — ABNORMAL LOW (ref 12.0–15.0)
Hemoglobin: 8.5 g/dL — ABNORMAL LOW (ref 12.0–15.0)
Hemoglobin: 8.2 g/dL — ABNORMAL LOW (ref 12.0–15.0)
O2 Saturation: 92 %
O2 Saturation: 90 %
O2 Saturation: 91 %
Potassium: 5 mmol/L (ref 3.5–5.1)
Potassium: 4.6 mmol/L (ref 3.5–5.1)
Potassium: 4.5 mmol/L (ref 3.5–5.1)
Sodium: 132 mmol/L — ABNORMAL LOW (ref 135–145)
Sodium: 133 mmol/L — ABNORMAL LOW (ref 135–145)
Sodium: 133 mmol/L — ABNORMAL LOW (ref 135–145)
TCO2: 25 mmol/L (ref 22–32)
TCO2: 23 mmol/L (ref 22–32)
TCO2: 23 mmol/L (ref 22–32)
pCO2 arterial: 55.9 mmHg — ABNORMAL HIGH (ref 32.0–48.0)
pCO2 arterial: 46.7 mmHg (ref 32.0–48.0)
pCO2 arterial: 46.3 mmHg (ref 32.0–48.0)
pH, Arterial: 7.236 — ABNORMAL LOW (ref 7.350–7.450)
pH, Arterial: 7.277 — ABNORMAL LOW (ref 7.350–7.450)
pH, Arterial: 7.28 — ABNORMAL LOW (ref 7.350–7.450)
pO2, Arterial: 77 mmHg — ABNORMAL LOW (ref 83.0–108.0)
pO2, Arterial: 67 mmHg — ABNORMAL LOW (ref 83.0–108.0)
pO2, Arterial: 69 mmHg — ABNORMAL LOW (ref 83.0–108.0)

## 2018-07-23 LAB — GLUCOSE, CAPILLARY
Glucose-Capillary: 157 mg/dL — ABNORMAL HIGH (ref 70–99)
Glucose-Capillary: 135 mg/dL — ABNORMAL HIGH (ref 70–99)
Glucose-Capillary: 158 mg/dL — ABNORMAL HIGH (ref 70–99)
Glucose-Capillary: 146 mg/dL — ABNORMAL HIGH (ref 70–99)
Glucose-Capillary: 168 mg/dL — ABNORMAL HIGH (ref 70–99)

## 2018-07-23 LAB — CBC
HCT: 32.5 % — ABNORMAL LOW (ref 36.0–46.0)
Hemoglobin: 10 g/dL — ABNORMAL LOW (ref 12.0–15.0)
MCH: 27.7 pg (ref 26.0–34.0)
MCHC: 30.8 g/dL (ref 30.0–36.0)
MCV: 90 fL (ref 80.0–100.0)
Platelets: 105 10*3/uL — ABNORMAL LOW (ref 150–400)
RBC: 3.61 MIL/uL — ABNORMAL LOW (ref 3.87–5.11)
RDW: 14.9 % (ref 11.5–15.5)
WBC: 3 10*3/uL — ABNORMAL LOW (ref 4.0–10.5)
nRBC: 3.6 % — ABNORMAL HIGH (ref 0.0–0.2)

## 2018-07-23 LAB — CULTURE, RESPIRATORY W GRAM STAIN: Culture: NORMAL

## 2018-07-23 LAB — CULTURE, RESPIRATORY

## 2018-07-23 LAB — POCT ACTIVATED CLOTTING TIME: Activated Clotting Time: 0 seconds

## 2018-07-23 LAB — HEPARIN LEVEL (UNFRACTIONATED): Heparin Unfractionated: 0.43 IU/mL (ref 0.30–0.70)

## 2018-07-23 LAB — APTT: aPTT: 104 seconds — ABNORMAL HIGH (ref 24–36)

## 2018-07-23 LAB — MAGNESIUM: Magnesium: 2.3 mg/dL (ref 1.7–2.4)

## 2018-07-23 MED ORDER — PRISMASOL BGK 0/2.5 32-2.5 MEQ/L REPLACEMENT SOLN
Status: DC
  Filled 2018-07-23: qty 5000

## 2018-07-23 MED ORDER — VASOPRESSIN 20 UNIT/ML IV SOLN
0.0100 [IU]/min | INTRAVENOUS | Status: DC
  Administered 2018-07-23: 0.03 [IU]/min via INTRAVENOUS
  Filled 2018-07-23 (×4): qty 2

## 2018-07-23 MED ORDER — ALBUMIN HUMAN 5 % IV SOLN
INTRAVENOUS | Status: AC
  Administered 2018-07-23: 13:00:00
  Filled 2018-07-23: qty 250

## 2018-07-23 MED ORDER — ALBUMIN HUMAN 25 % IV SOLN
25.0000 g | Freq: Once | INTRAVENOUS | Status: AC
  Administered 2018-07-23: 25 g via INTRAVENOUS
  Filled 2018-07-23: qty 50

## 2018-07-23 MED ORDER — PRISMASOL BGK 0/2.5 32-2.5 MEQ/L REPLACEMENT SOLN
Status: DC
  Filled 2018-07-23 (×2): qty 5000

## 2018-07-23 MED ORDER — SODIUM CHLORIDE 0.9 % IV SOLN
750.0000 mg | Freq: Two times a day (BID) | INTRAVENOUS | Status: DC
  Administered 2018-07-23: 750 mg via INTRAVENOUS
  Filled 2018-07-23 (×4): qty 7.5

## 2018-07-23 MED ORDER — PRISMASOL BGK 0/2.5 32-2.5 MEQ/L REPLACEMENT SOLN
Status: DC
  Administered 2018-07-23 – 2018-07-24 (×2): via INTRAVENOUS_CENTRAL
  Filled 2018-07-23 (×5): qty 5000

## 2018-07-23 MED ORDER — PRISMASOL BGK 0/2.5 32-2.5 MEQ/L REPLACEMENT SOLN
Status: DC
  Administered 2018-07-23: 10:00:00 via INTRAVENOUS_CENTRAL
  Filled 2018-07-23 (×3): qty 5000

## 2018-07-23 NOTE — Progress Notes (Signed)
[facility] PROGRESS NOTE  Assessment/ Plan: Pt is a 68 y.o. yo female with history of hypertension, CAD status post CABG, stroke, right BKA, COPD, CKD stage IV per problem list presented with weakness cardiac arrest twice, consulted for AKI.  Started CRRT on 5/4.  #Acute kidney injury on CKD likely ischemic ATN due to cardiogenic shock/cardiac arrest, anuric.  Patient likely with multiorgan failure including anoxic brain injury.  After lengthy discussion with the patient's daughter, started CRRT on 5/4 evening.  I do not think patient is a candidate for outpatient treatment.   -Continue goals of care discussion with the family, MRI showed new acute stroke.  Blood pressure marginal on Levophed and epinephrine.  For now continue CRRT with no UF, change pre and post filter to 0K, on heparin.  Palliative care following. Prognosis is poor.  #Cardiogenic shock/cardiac arrest: On epinephrine, Levophed.    Blood pressure marginal cardiology following.  #Myoclonic jerk/possible anoxic brain injury: Seen by neurology likely diffuse anoxic brain injury.  On Keppra and Versed for myoclonic jerk. -MRI with acute stroke  #Hyperkalemia: K 5.0.  Change dialysis prescription.  #Lactic acidosis.  #Poor prognosis.  Subjective: Seen and examined in ICU.  MRI with acute ischemic stroke.  Remains intubated, on 2 pressors with blood pressure 90s.  Palliative care involved.  No urine output.  Objective Vital signs in last 24 hours: Vitals:   07/23/18 0730 07/23/18 0745 07/23/18 0800 07/23/18 0815  BP: (!) 82/45 (!) 82/49 (!) 78/43 (!) 81/53  Pulse: 73  75 73  Resp: (!) 22 (!) 25 (!) 25 (!) 22  Temp: 97.9 F (36.6 C) 98.1 F (36.7 C) 98.2 F (36.8 C) 98.2 F (36.8 C)  TempSrc:   Oral   SpO2: 99%  98% 97%  Weight:      Height:       Weight change: 0.2 kg  Intake/Output Summary (Last 24 hours) at 07/23/2018 0847 Last data filed at 07/23/2018 0800 Gross per 24 hour  Intake  3092.25 ml  Output 2557 ml  Net 535.25 ml       Labs: Basic Metabolic Panel: Recent Labs  Lab 07/21/18 0315 07/21/18 1603 07/22/18 0400 07/22/18 1524 07/23/18 0357 07/23/18 0515  NA 134* 132* 133* 132* 132* 132*  K 4.9 5.0 4.8 4.7 4.9 5.0  CL 101 99 98 103 101  --   CO2 '22 24 22 22 22  '$ --   GLUCOSE 120* 162* 182* 156* 158*  --   BUN 31* '18 13 12 12  '$ --   CREATININE 2.80* 1.91* 1.64* 1.40* 1.56*  --   CALCIUM 7.7* 7.4* 7.4* 7.5* 7.2*  --   PHOS 2.9 2.3*  --  2.1*  --   --    Liver Function Tests: Recent Labs  Lab 07/21/18 0315  07/22/18 0400 07/22/18 1524 07/23/18 0357  AST 129*  --  72*  --  148*  ALT 139*  --  94*  --  112*  ALKPHOS 78  --  92  --  90  BILITOT 0.6  --  0.4  --  0.5  PROT 4.7*  --  4.8*  --  5.0*  ALBUMIN 2.2*  2.1*   < > 1.9* 1.8* 1.6*   < > = values in this interval not displayed.   No results for input(s): LIPASE, AMYLASE in the last 168 hours. No results for input(s): AMMONIA in the last 168 hours. CBC: Recent Labs  Lab 08/16/2018 2131  07/20/18  5329 07/21/18 0315 07/22/18 0400 07/23/18 0357 07/23/18 0515  WBC 16.3*  --  14.0* 10.8* 6.5 3.0*  --   NEUTROABS 9.4*  --   --   --   --   --   --   HGB 11.2*   < > 10.7* 10.2* 8.3* 10.0* 10.5*  HCT 38.5   < > 34.6* 31.2* 27.2* 32.5* 31.0*  MCV 96.0  --  90.8 87.9 90.1 90.0  --   PLT 196  --  208 118* 92* 105*  --    < > = values in this interval not displayed.   Cardiac Enzymes: Recent Labs  Lab 08/02/2018 2131 07/20/18 0446 07/20/18 1048 07/20/18 1720 07/20/18 2126  TROPONINI 0.03* 2.07* 2.11* 2.08* 1.35*   CBG: Recent Labs  Lab 07/22/18 1532 07/22/18 2029 07/22/18 2348 07/23/18 0403 07/23/18 0800  GLUCAP 127* 159* 151* 157* 135*    Iron Studies: No results for input(s): IRON, TIBC, TRANSFERRIN, FERRITIN in the last 72 hours. Studies/Results: Mr Brain Wo Contrast  Result Date: 07/22/2018 CLINICAL DATA:  Anoxic brain injury.  Prolonged cardiac arrest. EXAM: MRI HEAD  WITHOUT CONTRAST TECHNIQUE: Multiplanar, multiecho pulse sequences of the brain and surrounding structures were obtained without intravenous contrast. COMPARISON:  Head CT 07/20/2018 FINDINGS: Brain: There is a punctate acute infarct in the ventral right thalamus. No convincing acute infarct is identified elsewhere, nor is there evidence of generalized brain edema. There are chronic left frontal lobe infarcts in the ACA and MCA territories with associated chronic blood products, and the left MCA infarct also involves the insula. A chronic microhemorrhage is noted in the posterior left temporal lobe. Patchy T2 hyperintensities in the cerebral white matter bilaterally and in the pons are nonspecific but compatible with mild chronic small vessel ischemic disease. They are small chronic cerebellar infarcts bilaterally. Mild generalized cerebral atrophy is not greater than expected for age. There is no intracranial mass effect or extra-axial fluid collection. Vascular: Major intracranial vascular flow voids are preserved. Skull and upper cervical spine: Unremarkable bone marrow signal. Sinuses/Orbits: Unremarkable orbits. Mild left sphenoid sinus mucosal thickening. No significant mastoid fluid. Other: None. IMPRESSION: 1. Punctate acute right thalamic infarct. 2. Chronic ischemic changes including old left frontal infarcts. Electronically Signed   By: Logan Bores M.D.   On: 07/22/2018 18:15    Medications: Infusions: .  prismasol BGK 4/2.5 300 mL/hr at 07/22/18 2354  .  prismasol BGK 4/2.5 500 mL/hr at 07/22/18 2353  . amiodarone 30 mg/hr (07/23/18 0800)  . aztreonam Stopped (07/22/18 2137)  . epinephrine 18 mcg/min (07/23/18 0800)  . feeding supplement (VITAL AF 1.2 CAL) 20 mL/hr at 07/22/18 0200  . fentaNYL infusion INTRAVENOUS Stopped (07/23/18 0511)  . heparin 500 Units/hr (07/23/18 0800)  . levETIRAcetam Stopped (07/22/18 2228)  . midazolam Stopped (07/22/18 0814)  . norepinephrine (LEVOPHED) Adult  infusion 40 mcg/min (07/23/18 0800)  . prismasol BGK 4/2.5 1,500 mL/hr at 07/23/18 0821  . vancomycin      Scheduled Medications: . amiodarone  150 mg Intravenous Once  . aspirin  300 mg Rectal Daily  . chlorhexidine gluconate (MEDLINE KIT)  15 mL Mouth Rinse BID  . Chlorhexidine Gluconate Cloth  6 each Topical Daily  . insulin aspart  0-9 Units Subcutaneous Q4H  . mouth rinse  15 mL Mouth Rinse 10 times per day  . pantoprazole (PROTONIX) IV  40 mg Intravenous Daily  . sodium chloride flush  10-40 mL Intracatheter Q12H    have reviewed scheduled and  prn medications.  Physical Exam: General: Sedated and intubated, not responsive, Heart:RRR, s1s2 nl, no rubs Lungs: Coarse breath sound bilateral, no wheezing or crackle appreciated. Abdomen:soft, non-distended Extremities: Right BKA Dialysis Access: IJ temporary catheter site clean   Prasad  07/23/2018,8:47 AM  LOS: 4 days

## 2018-07-23 NOTE — Progress Notes (Signed)
[facility] Progress Note Patient Name: Rebecca Benton DOB: [birth year] MRN: [id number]   Date of Service  07/23/2018  HPI/Events of Note  Hypotensive on CRRT  eICU Interventions  23 % Albumin 25 gm iv x 1, check ABG        Judd Mccubbin U Octavio Matheney 07/23/2018, 5:04 AM

## 2018-07-23 NOTE — Progress Notes (Signed)
Wasted 273mL Fentanyl and 17mL Versed with Lindwood Coke, RN

## 2018-07-23 NOTE — Plan of Care (Signed)
  Problem: Clinical Measurements: Goal: Diagnostic test results will improve Outcome: Not Progressing   Problem: Clinical Measurements: Goal: Respiratory complications will improve Outcome: Not Progressing   Problem: Activity: Goal: Risk for activity intolerance will decrease Outcome: Not Progressing   Problem: Nutrition: Goal: Adequate nutrition will be maintained Outcome: Not Progressing   Problem: Coping: Goal: Level of anxiety will decrease Outcome: Not Applicable   Problem: Elimination: Goal: Will not experience complications related to urinary retention Outcome: Not Applicable   Problem: Safety: Goal: Ability to remain free from injury will improve Outcome: Progressing   Problem: Skin Integrity: Goal: Risk for impaired skin integrity will decrease Outcome: Progressing

## 2018-07-23 NOTE — Progress Notes (Signed)
NAME:  Rebecca Benton, MRN:  [id number], DOB:  31-Jul-1950, LOS: 4 ADMISSION DATE:  08/14/2018, CONSULTATION DATE:  08/12/2018 REFERRING MD:  Dr. Regenia Skeeter, CHIEF COMPLAINT:  Cardiac arrest   Brief History   68 year old female presenting from home with witnessed prolonged cardiac arrest. Found in PEA by EMS. In the ED, had second PEA arrest. Likely primary cardiac event, however given her poor neurological function, she is not a candidate for intervention. Remains on multiple pressors.   Past Medical History  COPD, CKD stage 4, DM, CAD s/p CABG 2016, HTN, stroke, R BKA 2017  Significant Hospital Events   5/3 Admit/ cardiac arrest   Consults:  Cardiology Neuro Renal Palliative  Procedures:  5/3 ETT >> 5/4 Vas cath >> Significant Diagnostic Tests:  5/4 [facility] >> Old left frontal infarct. No intracranial abnormality 5/7 MRI Brain>> Punctate acute right thalamic infarct. Chronic ischemic changes including old left frontal infarcts.  Micro Data:  5/3 BCx 2 >>  NGTD, pending 5/3 UC >> Neg 5/3 trach asp >> Abundant GPR, few GPC 5/3 COVID >> neg  Antimicrobials:  Azactam 5/6>> Vanc 5/6>>  Interim history/subjective:  Hypothermic. Bear hugger in place, Epi gtt  At 20 , Levo at 40, Amio at 76 .  No weaning. Myoclonic jerking CVVHD off at present due to no available functioning machine>> working on getting one from [facility] Sedation off x 24 hours  Objective   Blood pressure (!) 86/37, pulse 74, temperature 98.1 F (36.7 C), resp. rate (!) 25, height 4\' 11"  (1.499 m), weight 58.2 kg, SpO2 97 %. CVP:  [0 mmHg-17 mmHg] 7 mmHg  Vent Mode: PRVC FiO2 (%):  [40 %-100 %] 60 % Set Rate:  [22 bmp-25 bmp] 25 bmp Vt Set:  [340 mL] 340 mL PEEP:  [5 cmH20] 5 cmH20 Plateau Pressure:  [13 cmH20-17 cmH20] 17 cmH20   Intake/Output Summary (Last 24 hours) at 07/23/2018 1044 Last data filed at 07/23/2018 1000 Gross per 24 hour  Intake 3189.49 ml  Output 2558 ml  Net 631.49 ml   Filed Weights   07/21/18  0407 07/22/18 0435 07/23/18 0457  Weight: 59.8 kg 58 kg 58.2 kg   Physical Exam: General: Critically ill-appearing, sedated, intermittent myoclonus, no sedation HENT: , AT, ETT / OGT in place and secure Eyes: 3mm pupils, non-reactive, EOMI, no scleral icterus Respiratory: Bilateral chest excursion, Few rhonchi, No crackles, wheezing or rales Cardiovascular:S1, S2,  RRR, -M/R/G, no JVD GI: BS+, soft, non tender, slightly distended Extremities:R BKA, no edema, no tenderness Neuro: No purposeful movement noted,Myoclonus, No gag,sedation off x 24 hours  Resolved Hospital Problem list   Shock liver secondary to cardiac arrest  Assessment & Plan:  68 year old female with past history of COPD, CKD stage 4, DM, CAD s/p CABG 2016, HTN, stroke, and R BKA 2017 presenting from home with cardiac arrest.    Acute encephalopathy Myoclonus Concerned for anoxic brain injury CT with NAICA EEG with GPEDs Precludes ability to liberate from ventilator P: Neuro checks Keppra  for myoclonus>> sedation is off Appreciate Neurology recs. Will hold Versed this am for neuro exam MRI without contrast>>  Palliative Care Consult placed>> Initiated conversations with family who remain hopeful for recovery  Acute respiratory failure COPD Resp Acidosis per ABG 5/7 Unable to extubate d/t neuro status Failed SBT  GPR/GPC in trach aspirate No CXR 5/7 P:  Full vent support Incease resp rate to 28 ABG in 2 hours CXR 5/8 am and prn VAP bundle  Duonebs q 6 prn  Vanc and Azactam started 5/6   Septic Shock GPR/GPC in trach aspirate P: Start broad spectrum antibiotics F/u cultures Pressor support for MAPs >60  Cardiac arrest Cardiogenic shock  Acute congestive heart failure Initial prolonged PEA/ then VT. Likely cardiac etiology Troponin peaked 2.11 TTE with reduced EF 35-50% and impaired relaxation Unfortunately, due to her poor neurologic exam and prolonged arrest is not a candidate for  interventional workup per Cardiology P:  Defer TTM due to prolonged arrest Telemetry Wean epi and levo for SBP goal >90 Amio gtt Heparin gtt Daily ASA Goal K >4 and Mg >2  AKI P:  CRRT per nephrology>> on hold 2/2 malfunctioning machine Trend BMP / urinary output Replace electrolytes as indicated Avoid nephrotoxic agents, ensure adequate renal perfusion  DM Hyperglycemia/Hypoglycemia P:  DC D5 gtt SSI sensitive scale CBG q 4  Best practice:  Diet: TF Pain/Anxiety/Delirium protocol (if indicated): as above for myoclonus VAP protocol (if indicated): yes DVT prophylaxis: heparin gtt GI prophylaxis: PPI Glucose control: SSI  Mobility: BR Code Status: Remain full code Family Communication:  -Updated daughter Jolanda on 5/5. She is aware of Palliative consult. -Daughter Jolanda [phone]), 2nd contact/family Modena Slater [phone].  Jolanda ask that no information be given out other than these 2 listed.  Disposition: ICU  Continued discussions needed with daughter re goals of care. Per Palliation note, patient daughter remains hopeful for recovery, because her mother has recovered in the past. Minimally, Code status needs to be addressed  Labs   CBC: Recent Labs  Lab 08/03/2018 2131  07/20/18 0446 07/21/18 0315 07/22/18 0400 07/23/18 0357 07/23/18 0515  WBC 16.3*  --  14.0* 10.8* 6.5 3.0*  --   NEUTROABS 9.4*  --   --   --   --   --   --   HGB 11.2*   < > 10.7* 10.2* 8.3* 10.0* 10.5*  HCT 38.5   < > 34.6* 31.2* 27.2* 32.5* 31.0*  MCV 96.0  --  90.8 87.9 90.1 90.0  --   PLT 196  --  208 118* 92* 105*  --    < > = values in this interval not displayed.    Basic Metabolic Panel: Recent Labs  Lab 07/20/18 0446 07/20/18 2126 07/21/18 0315 07/21/18 1603 07/22/18 0400 07/22/18 1524 07/23/18 0357 07/23/18 0515  NA 135 137 134* 132* 133* 132* 132* 132*  K 5.7* 4.6 4.9 5.0 4.8 4.7 4.9 5.0  CL 106 106 101 99 98 103 101  --   CO2 17* 19* 22 24 22 22 22   --    GLUCOSE 476* 44* 120* 162* 182* 156* 158*  --   BUN 57* 45* 31* 18 13 12 12   --   CREATININE 4.20* 3.57* 2.80* 1.91* 1.64* 1.40* 1.56*  --   CALCIUM 9.9 8.1* 7.7* 7.4* 7.4* 7.5* 7.2*  --   MG 2.4  --  2.1  --  2.2  --  2.3  --   PHOS 5.3* 4.5 2.9 2.3*  --  2.1*  --   --    GFR: Estimated Creatinine Clearance: 26.4 mL/min (A) (by C-G formula based on SCr of 1.56 mg/dL (H)). Recent Labs  Lab 08/06/2018 2248 07/20/18 0446 07/20/18 0951 07/20/18 1251 07/21/18 0315 07/22/18 0400 07/23/18 0357  WBC  --  14.0*  --   --  10.8* 6.5 3.0*  LATICACIDVEN 5.6*  --  2.3* 2.4*  --   --   --  Liver Function Tests: Recent Labs  Lab 08/07/2018 2131 07/20/18 0446  07/21/18 0315 07/21/18 1603 07/22/18 0400 07/22/18 1524 07/23/18 0357  AST 102* 412*  --  129*  --  72*  --  148*  ALT 82* 222*  --  139*  --  94*  --  112*  ALKPHOS 100 87  --  78  --  92  --  90  BILITOT 0.6 1.0  --  0.6  --  0.4  --  0.5  PROT 6.0* 5.1*  --  4.7*  --  4.8*  --  5.0*  ALBUMIN 2.8* 2.4*   < > 2.2*  2.1* 2.0* 1.9* 1.8* 1.6*   < > = values in this interval not displayed.   No results for input(s): LIPASE, AMYLASE in the last 168 hours. No results for input(s): AMMONIA in the last 168 hours.  ABG    Component Value Date/Time   PHART 7.236 (L) 07/23/2018 0515   PCO2ART 55.9 (H) 07/23/2018 0515   PO2ART 77.0 (L) 07/23/2018 0515   HCO3 23.7 07/23/2018 0515   TCO2 25 07/23/2018 0515   ACIDBASEDEF 4.0 (H) 07/23/2018 0515   O2SAT 92.0 07/23/2018 0515     Coagulation Profile: Recent Labs  Lab 08/16/2018 2131  INR 1.2    Cardiac Enzymes: Recent Labs  Lab 07/27/2018 2131 07/20/18 0446 07/20/18 1048 07/20/18 1720 07/20/18 2126  TROPONINI 0.03* 2.07* 2.11* 2.08* 1.35*    HbA1C: No results found for: HGBA1C  CBG: Recent Labs  Lab 07/22/18 1532 07/22/18 2029 07/22/18 2348 07/23/18 0403 07/23/18 0800  GLUCAP 127* 159* 151* 157* 135*    Critical care time: 30 min     Magdalen Spatz,  AGACNP-BC [place] Pager # [phone] After 4 pm call : [phone] 07/23/2018 10:44 AM

## 2018-07-23 NOTE — Progress Notes (Signed)
Responded to spiritual care consult to provide support to pt and follow up request to talk with daughter. I spoke with patient's nurse and she said patient is really sick and unable to communicate.  I did not call daughter because night Voncille Lo spoke with daughter last night.   Supported  Biochemist, clinical. Chaplain available as needed.  Jaclynn Major, [place], [facility], Pager [phone]

## 2018-07-23 NOTE — Progress Notes (Signed)
ANTICOAGULATION CONSULT NOTE - [place] for heparin Indication: chest pain/ACS  Allergies  Allergen Reactions  . Penicillins Anaphylaxis and Other (See Comments)    Did it involve swelling of the face/tongue/throat, SOB, or low BP? Yes Did it involve sudden or severe rash/hives, skin peeling, or any reaction on the inside of your mouth or nose? Yes Did you need to seek medical attention at a hospital or doctor's office? Yes When did it last happen?childhood If all above answers are "NO", may proceed with cephalosporin use.   . Tape Itching and Dermatitis    Patient Measurements: Height: _0  (149.9 cm) Weight: 128 lb 4.9 oz (58.2 kg) IBW/kg (Calculated) : 43.2  Vital Signs: Temp: 97.5 F (36.4 C) (05/07 0700) Temp Source: Esophageal (05/07 0400) BP: 90/48 (05/07 0700) Pulse Rate: 73 (05/07 0700)  Labs: Recent Labs    07/20/18 1048  07/20/18 1720  07/20/18 2126  07/21/18 0315  07/22/18 0102 07/22/18 0400 07/22/18 0848 07/22/18 1524 07/23/18 0357 07/23/18 0515  HGB  --   --   --   --   --    < > 10.2*  --   --  8.3*  --   --  10.0* 10.5*  HCT  --   --   --   --   --    < > 31.2*  --   --  27.2*  --   --  32.5* 31.0*  PLT  --   --   --   --   --   --  118*  --   --  92*  --   --  105*  --   APTT  --   --   --   --   --   --  >200*  --   --  121*  --   --  104*  --   HEPARINUNFRC  --    < >  --   --   --    < > 1.10*   < > 0.44  --  0.34  --  0.43  --   CREATININE  --   --   --    < > 3.57*  --  2.80*   < >  --  1.64*  --  1.40* 1.56*  --   TROPONINI 2.11*  --  2.08*  --  1.35*  --   --   --   --   --   --   --   --   --    < > = values in this interval not displayed.    Estimated Creatinine Clearance: 26.4 mL/min (A) (by C-G formula based on SCr of 1.56 mg/dL (H)).   Medical History: Past Medical History:  Diagnosis Date  . CKD (chronic kidney disease) stage 4, GFR 15-29 ml/min (HCC)   . COPD (chronic obstructive pulmonary  disease) ([place])   . Diabetes ([place])   . Hx of CABG   . Hypertension   . Stroke [facility])     Medications:  Medications Prior to Admission  Medication Sig Dispense Refill Last Dose  . albuterol (PROVENTIL) (2.5 MG/3ML) 0.083% nebulizer solution Take 2.5 mg by nebulization 3 (three) times daily as needed for wheezing or shortness of breath.   UNK  . albuterol (VENTOLIN HFA) 108 (90 Base) MCG/ACT inhaler Inhale 1-2 puffs into the lungs every 6 (six) hours as needed for wheezing or shortness of breath.   UNK  .  ALPRAZolam (XANAX) 1 MG tablet Take 1 mg by mouth 3 (three) times daily as needed for anxiety.   UNK  . amLODipine (NORVASC) 5 MG tablet Take 5 mg by mouth daily.   UNK  . aspirin EC 81 MG tablet Take 81 mg by mouth daily.   UNK  . atorvastatin (LIPITOR) 40 MG tablet Take 40 mg by mouth daily.   UNK  . carvedilol (COREG) 25 MG tablet Take 25 mg by mouth 2 (two) times daily with a meal.   UNK  . clopidogrel (PLAVIX) 75 MG tablet Take 75 mg by mouth daily.   UNK  . Cyanocobalamin 1000 MCG/ML KIT Inject 1 mL as directed every 30 (thirty) minutes.   07/11/2018  . DULoxetine (CYMBALTA) 30 MG capsule Take 30 mg by mouth daily.   UNK  . gabapentin (NEURONTIN) 300 MG capsule Take 300 mg by mouth at bedtime.   UNK  . isosorbide mononitrate (IMDUR) 30 MG 24 hr tablet Take 30 mg by mouth daily.   UNK  . oxyCODONE-acetaminophen (PERCOCET) 10-325 MG tablet Take 1 tablet by mouth every 6 (six) hours as needed for pain.   UNK   Scheduled:  . amiodarone  150 mg Intravenous Once  . aspirin  300 mg Rectal Daily  . chlorhexidine gluconate (MEDLINE KIT)  15 mL Mouth Rinse BID  . Chlorhexidine Gluconate Cloth  6 each Topical Daily  . insulin aspart  0-9 Units Subcutaneous Q4H  . mouth rinse  15 mL Mouth Rinse 10 times per day  . pantoprazole (PROTONIX) IV  40 mg Intravenous Daily  . sodium chloride flush  10-40 mL Intracatheter Q12H   Infusions:  .  prismasol BGK 4/2.5 300 mL/hr at 07/22/18 2354  .   prismasol BGK 4/2.5 500 mL/hr at 07/22/18 2353  . amiodarone 30 mg/hr (07/23/18 0700)  . aztreonam Stopped (07/22/18 2137)  . epinephrine 18 mcg/min (07/23/18 0700)  . feeding supplement (VITAL AF 1.2 CAL) 20 mL/hr at 07/22/18 0200  . fentaNYL infusion INTRAVENOUS Stopped (07/23/18 0511)  . heparin 500 Units/hr (07/23/18 0700)  . levETIRAcetam Stopped (07/22/18 2228)  . midazolam Stopped (07/22/18 0814)  . norepinephrine (LEVOPHED) Adult infusion 40 mcg/min (07/23/18 0708)  . prismasol BGK 4/2.5 1,500 mL/hr at 07/23/18 0148  . vancomycin      Assessment: 68yo female presented as code STEMI after PEA/Vfib arrests, was not brought to cath lab given risks > benefit, now to start heparin.  Heparin level therapeutic at 0.43 this AM on 500 units/hr. Hgb 10.5, plts improved.   Goal of Therapy:  Heparin level 0.3-0.7 units/ml Monitor platelets by anticoagulation protocol: Yes   Plan:  Continue heparin gtt at 500 units/hr Monitor daily heparin level, CBC, s/sx bleeding    Juanell Fairly, PharmD PGY1 Pharmacy Resident Phone [phone] 07/23/2018 7:10 AM

## 2018-07-23 NOTE — Progress Notes (Addendum)
Palliative:  I discussed Rebecca Benton's status today with Apolonio Schneiders, RN. There are no changes in Rebecca Benton's neurological state but she is having lower BPs with max infusions of epi and levophed. No urine output and unable to pull fluid with CRRT s/t hypotension.   I called and continued my conversation with daughter, Rebecca Benton. We discussed in detail multiorgan failure - no purposeful movements, no pupilary response to light, and myoclonus. No signs of neurological improvement. She is vent dependent and I explained CRRT and concern with hypotension and concern with further stress to her heart (EF 35%). Rebecca Benton herself is a dialysis patient so she understands this complication. We also discussed new stroke on MRI.   Jolonda connected her aunt to phone call. I reiterated the above points. I expressed concern that Rebecca Benton is receiving maximum aggressive treatment with nothing left to add to her care and has shown no improvements but in fact decline in her BP. I fear that Rebecca Benton continues to hope that her mother will arouse off sedation (she says it took her 8 days to wake up after stroke). I very clearly explained that she has multiple organs failing even if she were to arouse this is not fixing the problem.   I again discussed code status and explained that I fear it would be harmful and cruel to provide CPR again. Rebecca Benton tells me that she is afraid "the hospital" will not continue interventions to given her mother a chance to improve and that we will make decisions without her. I explained we would never make decisions without her and we are not discussing withdrawing care but setting one limit that we feel would be in her mother's best interest. I did explain there may be a time when her mother's medical team have to opt to not provide CPR as we have to provide interventions that will benefit and not to harm our patients. I also explained that if she cannot tolerate dialysis this leaves [place] with no further  options.   All questions/concerns addressed. Family appreciative of update and time spent explaining conditions and expectations. Emotional support provided. Rebecca Benton would benefit from continued phone support from spiritual care/chaplain.   Plan: - Will need continued [place] conversation. Rebecca Benton seems to be trying to accept poor prognosis but it will be difficult for her to make any decisions herself. She will require clear navigation and guidance from medical team. She has promised her mother she would do everything possible to keep her alive.   Discussed with Eric Form, NP and Rhea Pink, DO for follow up tomorrow.   [id number] 60 min  Vinie Sill, NP Palliative Medicine Team Pager # [phone] (M-F 8a-5p) Team Phone # [phone] (Nights/Weekends)  The above conversation was completed via telephone due to the visitor restrictions during the COVID-19 pandemic. Thorough chart review and discussion with necessary members of the care team was completed as part of assessment. All issues were discussed and addressed but no physical exam was performed.

## 2018-07-24 ENCOUNTER — Inpatient Hospital Stay (HOSPITAL_COMMUNITY): Payer: Medicare Other

## 2018-07-24 LAB — CULTURE, BLOOD (ROUTINE X 2)
Culture: NO GROWTH
Culture: NO GROWTH

## 2018-07-24 LAB — COMPREHENSIVE METABOLIC PANEL
ALT: 428 U/L — ABNORMAL HIGH (ref 0–44)
AST: 712 U/L — ABNORMAL HIGH (ref 15–41)
Albumin: 2.3 g/dL — ABNORMAL LOW (ref 3.5–5.0)
Alkaline Phosphatase: 77 U/L (ref 38–126)
Anion gap: 13 (ref 5–15)
BUN: 9 mg/dL (ref 8–23)
CO2: 17 mmol/L — ABNORMAL LOW (ref 22–32)
Calcium: 7.3 mg/dL — ABNORMAL LOW (ref 8.9–10.3)
Chloride: 102 mmol/L (ref 98–111)
Creatinine, Ser: 1.3 mg/dL — ABNORMAL HIGH (ref 0.44–1.00)
GFR calc Af Amer: 48 mL/min — ABNORMAL LOW (ref >60)
GFR calc non Af Amer: 42 mL/min — ABNORMAL LOW (ref >60)
Glucose, Bld: 131 mg/dL — ABNORMAL HIGH (ref 70–99)
Potassium: 5.1 mmol/L (ref 3.5–5.1)
Sodium: 132 mmol/L — ABNORMAL LOW (ref 135–145)
Total Bilirubin: 0.8 mg/dL (ref 0.3–1.2)
Total Protein: 5 g/dL — ABNORMAL LOW (ref 6.5–8.1)

## 2018-07-24 LAB — POCT I-STAT 7, (LYTES, BLD GAS, ICA,H+H)
Acid-base deficit: 10 mmol/L — ABNORMAL HIGH (ref 0.0–2.0)
Acid-base deficit: 6 mmol/L — ABNORMAL HIGH (ref 0.0–2.0)
Acid-base deficit: 6 mmol/L — ABNORMAL HIGH (ref 0.0–2.0)
Bicarbonate: 19.2 mmol/L — ABNORMAL LOW (ref 20.0–28.0)
Bicarbonate: 22.2 mmol/L (ref 20.0–28.0)
Bicarbonate: 21.7 mmol/L (ref 20.0–28.0)
Calcium, Ion: 1.1 mmol/L — ABNORMAL LOW (ref 1.15–1.40)
Calcium, Ion: 1.05 mmol/L — ABNORMAL LOW (ref 1.15–1.40)
Calcium, Ion: 1.03 mmol/L — ABNORMAL LOW (ref 1.15–1.40)
HCT: 22 % — ABNORMAL LOW (ref 36.0–46.0)
HCT: 24 % — ABNORMAL LOW (ref 36.0–46.0)
HCT: 24 % — ABNORMAL LOW (ref 36.0–46.0)
Hemoglobin: 7.5 g/dL — ABNORMAL LOW (ref 12.0–15.0)
Hemoglobin: 8.2 g/dL — ABNORMAL LOW (ref 12.0–15.0)
Hemoglobin: 8.2 g/dL — ABNORMAL LOW (ref 12.0–15.0)
O2 Saturation: 81 %
O2 Saturation: 99 %
O2 Saturation: 92 %
Patient temperature: 36.3
Patient temperature: 35.5
Patient temperature: 35.2
Potassium: 5.1 mmol/L (ref 3.5–5.1)
Potassium: 5 mmol/L (ref 3.5–5.1)
Potassium: 5.5 mmol/L — ABNORMAL HIGH (ref 3.5–5.1)
Sodium: 132 mmol/L — ABNORMAL LOW (ref 135–145)
Sodium: 132 mmol/L — ABNORMAL LOW (ref 135–145)
Sodium: 131 mmol/L — ABNORMAL LOW (ref 135–145)
TCO2: 21 mmol/L — ABNORMAL LOW (ref 22–32)
TCO2: 24 mmol/L (ref 22–32)
TCO2: 23 mmol/L (ref 22–32)
pCO2 arterial: 58.5 mmHg — ABNORMAL HIGH (ref 32.0–48.0)
pCO2 arterial: 52.9 mmHg — ABNORMAL HIGH (ref 32.0–48.0)
pCO2 arterial: 51.7 mmHg — ABNORMAL HIGH (ref 32.0–48.0)
pH, Arterial: 7.119 — CL (ref 7.350–7.450)
pH, Arterial: 7.222 — ABNORMAL LOW (ref 7.350–7.450)
pH, Arterial: 7.221 — ABNORMAL LOW (ref 7.350–7.450)
pO2, Arterial: 59 mmHg — ABNORMAL LOW (ref 83.0–108.0)
pO2, Arterial: 149 mmHg — ABNORMAL HIGH (ref 83.0–108.0)
pO2, Arterial: 70 mmHg — ABNORMAL LOW (ref 83.0–108.0)

## 2018-07-24 LAB — CBC
HCT: 24.1 % — ABNORMAL LOW (ref 36.0–46.0)
Hemoglobin: 7.2 g/dL — ABNORMAL LOW (ref 12.0–15.0)
MCH: 27.8 pg (ref 26.0–34.0)
MCHC: 29.9 g/dL — ABNORMAL LOW (ref 30.0–36.0)
MCV: 93.1 fL (ref 80.0–100.0)
Platelets: 74 10*3/uL — ABNORMAL LOW (ref 150–400)
RBC: 2.59 MIL/uL — ABNORMAL LOW (ref 3.87–5.11)
RDW: 15.6 % — ABNORMAL HIGH (ref 11.5–15.5)
WBC: 4.7 10*3/uL (ref 4.0–10.5)
nRBC: 6.4 % — ABNORMAL HIGH (ref 0.0–0.2)

## 2018-07-24 LAB — GLUCOSE, CAPILLARY
Glucose-Capillary: 158 mg/dL — ABNORMAL HIGH (ref 70–99)
Glucose-Capillary: 88 mg/dL (ref 70–99)
Glucose-Capillary: 98 mg/dL (ref 70–99)

## 2018-07-24 LAB — PATHOLOGIST SMEAR REVIEW

## 2018-07-24 LAB — MAGNESIUM: Magnesium: 2.5 mg/dL — ABNORMAL HIGH (ref 1.7–2.4)

## 2018-07-24 LAB — APTT: aPTT: 136 seconds — ABNORMAL HIGH (ref 24–36)

## 2018-07-24 LAB — HEPARIN LEVEL (UNFRACTIONATED): Heparin Unfractionated: 0.43 IU/mL (ref 0.30–0.70)

## 2018-07-24 MED ORDER — ALBUMIN HUMAN 25 % IV SOLN
25.0000 g | Freq: Once | INTRAVENOUS | Status: AC
  Administered 2018-07-24: 25 g via INTRAVENOUS
  Filled 2018-07-24: qty 50

## 2018-07-24 MED ORDER — STERILE WATER FOR INJECTION IV SOLN
INTRAVENOUS | Status: DC
  Administered 2018-07-24: 04:00:00 via INTRAVENOUS
  Filled 2018-07-24: qty 850

## 2018-07-24 MED ORDER — DOPAMINE-DEXTROSE 3.2-5 MG/ML-% IV SOLN
INTRAVENOUS | Status: AC
  Filled 2018-07-24: qty 250

## 2018-07-24 MED ORDER — SODIUM CHLORIDE 0.9 % IV SOLN
INTRAVENOUS | Status: DC | PRN
  Administered 2018-07-24: 250 mL via INTRAVENOUS

## 2018-07-24 MED ORDER — SODIUM BICARBONATE 8.4 % IV SOLN
50.0000 meq | INTRAVENOUS | Status: AC
  Administered 2018-07-24: 50 meq via INTRAVENOUS

## 2018-07-24 MED ORDER — SODIUM BICARBONATE 8.4 % IV SOLN
INTRAVENOUS | Status: AC
  Filled 2018-07-24: qty 100

## 2018-07-24 MED ORDER — DOPAMINE-DEXTROSE 3.2-5 MG/ML-% IV SOLN
0.0000 ug/kg/min | INTRAVENOUS | Status: DC
  Administered 2018-07-24: 2.5 ug/kg/min via INTRAVENOUS

## 2018-07-24 MED ORDER — CALCIUM GLUCONATE-NACL 2-0.675 GM/100ML-% IV SOLN
2.0000 g | Freq: Once | INTRAVENOUS | Status: AC
  Administered 2018-07-24: 2000 mg via INTRAVENOUS
  Filled 2018-07-24: qty 100

## 2018-07-29 ENCOUNTER — Telehealth: Payer: Self-pay

## 2018-07-29 NOTE — Telephone Encounter (Signed)
Received dc from [facility].. DC is for cremation and Patient is a patient of Doctor Agarwala.  DC will be taken to [address] for signature.   On 07/31/2018 Received dc back from Doctor Agarwala. I called the funeral home to let them know dc was ready for pickup.

## 2018-08-17 NOTE — Procedures (Signed)
Extubation Procedure Note  Patient Details:   Name: Rebecca Benton DOB: 1950/04/28 MRN: [id number]   Airway Documentation:  Airway 7.5 mm (Active)  Secured at (cm) 25 cm 2018-07-27  7:32 AM  Measured From Lips 2018/07/27  7:32 AM  [place] 07/27/2018  7:32 AM  Secured By [employer] 07-27-18  7:32 AM  Tube Holder Repositioned Yes 07/27/18  7:32 AM  Cuff Pressure (cm H2O) 26 cm H2O 07/23/2018  8:05 PM  Site Condition Dry 07-27-18  7:32 AM   Vent end date: (not recorded) Vent end time: (not recorded)   Evaluation  O2 sats: currently acceptable Complications: No apparent complications Patient passed away.      Rebecca Benton Jul 27, 2018, 11:29 AM

## 2018-08-17 NOTE — Progress Notes (Signed)
Around 0200, patient's systolic BP was dropping into the mid 80's and HR dropped to 45. All pressors were at there maximum dose. [place] notified. Orders were to pause the CRRT, give 25g of albumin, and start Dopamine drip at 2.38mcg to maintain HR >50. MD also gave permission to pause Amio drip for now. ABG's were obtained. Elink and Nephrologist were notified. Orders were to increase RR, Fi02, PEEP, and give an amp of Bicarb as well as start a Bicarb drip @75ml /hr. Verbal orders were to also change dialysate rate to 1800 on the CRRT. ABG"s were checked in an hour. MD was notified about the results. Patient also appeared to have brownish blood draining from her nose and mouth. Tube feedings were immediately stopped. Patient was immediately suctioned and turned on her side. Daughter was called and given updates on her mom. Daughter has dialysis today and won't be able to come. The daughter is sending her niece over today to have a conference with the doctor regarding the patient's care.

## 2018-08-17 NOTE — Death Summary Note (Signed)
DEATH SUMMARY   Patient Details  Name: Rebecca Benton MRN: [id number] DOB: [birth year]  Admission/Discharge Information   Admit Date:  2018/08/12  Date of Death: Date of Death: August 17, 2018  Time of Death: Time of Death: 06-30-17  Length of Stay: 5  Referring Physician: Lin Landsman, MD   Reason(s) for Hospitalization  Cardiac arrest.  Diagnoses  Preliminary cause of death: distributive shock.  Secondary Diagnoses (including complications and co-morbidities):  Active Problems:   Cardiac arrest ([place])   AKI (acute kidney injury) ([place])   Goals of care, counseling/discussion   Palliative care encounter   Brief Hospital Course (including significant findings, care, treatment, and services provided and events leading to death)  Rebecca Benton is a 68 y.o. year old female who is chronically ill.  She was resuscitated from prolonged cardiac arrest. 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 She has worsening vasopressor dependent shock and failed to improve in spite of maximal therapy.  Family was notified and allowed to come in, but the patient became increasing bradycardic and arrested prior to the family's arrival. She did not receive further CPR.  Pertinent Labs and Studies  Significant Diagnostic Studies Ct Head Wo Contrast  Result Date: 07/20/2018 CLINICAL DATA:  Witnessed arrest EXAM: CT HEAD WITHOUT CONTRAST TECHNIQUE: Contiguous axial images were obtained from the base of the skull through the vertex without intravenous contrast. COMPARISON:  None. FINDINGS: Brain: Evidence of old infarct and encephalomalacia within the left frontal lobe. Chronic small vessel disease throughout the deep white matter. No acute intracranial abnormality. Specifically, no hemorrhage, hydrocephalus, mass lesion, acute infarction, or significant intracranial injury. Vascular: No hyperdense vessel or unexpected calcification. Skull: No acute calvarial abnormality. Sinuses/Orbits: Coastal  thickening throughout the paranasal sinuses. Other: None IMPRESSION: Old left frontal infarct. No acute intracranial abnormality. Chronic sinusitis. Electronically Signed   By: Rolm Baptise M.D.   On: 07/20/2018 02:08   Mr Brain Wo Contrast  Result Date: 07/22/2018 CLINICAL DATA:  Anoxic brain injury.  Prolonged cardiac arrest. EXAM: MRI HEAD WITHOUT CONTRAST TECHNIQUE: Multiplanar, multiecho pulse sequences of the brain and surrounding structures were obtained without intravenous contrast. COMPARISON:  Head CT 07/20/2018 FINDINGS: Brain: There is a punctate acute infarct in the ventral right thalamus. No convincing acute infarct is identified elsewhere, nor is there evidence of generalized brain edema. There are chronic left frontal lobe infarcts in the ACA and MCA territories with associated chronic blood products, and the left MCA infarct also involves the insula. A chronic microhemorrhage is noted in the posterior left temporal lobe. Patchy T2 hyperintensities in the cerebral white matter bilaterally and in the pons are nonspecific but compatible with mild chronic small vessel ischemic disease. They are small chronic cerebellar infarcts bilaterally. Mild generalized cerebral atrophy is not greater than expected for age. There is no intracranial mass effect or extra-axial fluid collection. Vascular: Major intracranial vascular flow voids are preserved. Skull and upper cervical spine: Unremarkable bone marrow signal. Sinuses/Orbits: Unremarkable orbits. Mild left sphenoid sinus mucosal thickening. No significant mastoid fluid. Other: None. IMPRESSION: 1. Punctate acute right thalamic infarct. 2. Chronic ischemic changes including old left frontal infarcts. Electronically Signed   By: Logan Bores M.D.   On: 07/22/2018 18:15   US Renal  Result Date: 07/20/2018 CLINICAL DATA:  Acute renal injury EXAM: RENAL / URINARY TRACT ULTRASOUND COMPLETE COMPARISON:  None. FINDINGS: Right Kidney: Renal measurements: 8.9 x  4.8 x 5.7 cm. = volume: 126 mL. Mild increased  echogenicity is noted. Two small cysts are identified. The largest of these measures 1.6 cm. No hydronephrosis is noted. Left Kidney: Renal measurements: 9.9 x 4.7 x 5.1 cm. = volume: 122 mL. Cystic lesion is noted measuring 3.3 cm in greatest dimension in the upper pole of the left kidney. There is some suggestion of mild septation although this may represent 2 adjacent cysts. Mild increased echogenicity is noted. No hydronephrosis is noted. Bladder: Bladder is decompressed by Foley catheter. IMPRESSION: Cystic changes bilaterally. Suggestion of mild septation is noted on the left. Short-term follow-up exam in 6 months is recommended to assess for stability. No hydronephrosis is noted. Electronically Signed   By: Inez Catalina M.D.   On: 07/20/2018 17:29   Dg Chest Port 1 View  Result Date: Aug 08, 2018 CLINICAL DATA:  ET tube present. EXAM: PORTABLE CHEST 1 VIEW COMPARISON:  07/20/2018. FINDINGS: ET tube 2.6 cm above carina. Dual lumen catheter tips SVC RA junction RIGHT jugular approach. Additional central venous line LEFT IJ approach lies in the distal SVC. Cardiac size is improved. Median sternotomy wires. Prior CABG. Mild RIGHT base increased markings, could represent early atelectasis or infiltrate. IMPRESSION: 1. Mild RIGHT base increased markings, could represent early atelectasis or infiltrate. 2. ET tube 2.6 cm above carina. Electronically Signed   By: Staci Righter M.D.   On: 08-08-18 08:03   Dg Chest Port 1 View  Result Date: 07/20/2018 CLINICAL DATA:  Central line placement. EXAM: PORTABLE CHEST 1 VIEW COMPARISON:  Chest x-ray dated 07/20/2018. FINDINGS: The endotracheal tube terminates just above the carina by approximately 2 cm. The newly placed right-sided central venous catheter tip terminates over the mid to distal SVC. The left-sided central venous catheter is unchanged in position. The enteric tube extends below the left hemidiaphragm.  Evaluation is somewhat limited by overlying defibrillator pads. There is no pneumothorax. Trace bilateral pleural effusions are noted. IMPRESSION: Newly placed right-sided central venous catheter tip projects over the SVC. The endotracheal tube terminates just above the carina by approximately 2 cm. Otherwise, stable appearance of the chest. No pneumothorax. Electronically Signed   By: Constance Holster M.D.   On: 07/20/2018 16:42   Dg Chest Port 1 View  Result Date: 07/20/2018 CLINICAL DATA:  68 y/o  F; central line placement. EXAM: PORTABLE CHEST 1 VIEW COMPARISON:  07/20/2018 chest radiograph FINDINGS: Endotracheal tube 3.1 cm above the carina. Enteric tube tip extends below the field of view into the abdomen. Left central venous catheter tip projects over the upper SVC. No pneumothorax. Stable cardiomediastinal silhouette. Post median sternotomy with wires in alignment. No new consolidation, effusion, or pneumothorax. No acute osseous abnormality is evident. IMPRESSION: Left central venous catheter tip projects over upper SVC. No acute pulmonary process identified. Electronically Signed   By: Kristine Garbe M.D.   On: 07/20/2018 03:10   Dg Chest Portable 1 View  Result Date: 08/04/2018 CLINICAL DATA:  68 y/o F; patient coded, respiratory distress, intubation. EXAM: PORTABLE CHEST 1 VIEW COMPARISON:  None. FINDINGS: Normal cardiac silhouette given projection and technique. Post median sternotomy with wires intact. Endotracheal tube tip 3.4 cm above the carina. Enteric tube tip extends below the field of view into the abdomen. Aortic atherosclerosis with calcification. Clear lungs. No pleural effusion or pneumothorax. No acute osseous abnormality is evident. IMPRESSION: 1. Endotracheal tube tip 3.4 cm above the carina. Enteric tube tip extends below the field of view into the abdomen. 2. No acute pulmonary process identified. Electronically Signed   By: Mia Creek  [place]rat[place] M.D.   On:  08/06/2018 22:25   Dg Abd Portable 1v  Result Date: 07/23/2018 CLINICAL DATA:  Enteric tube placement. EXAM: PORTABLE ABDOMEN - 1 VIEW COMPARISON:  None. FINDINGS: The enteric tube tip projects over the gastric body. The bowel gas pattern is nonspecific. There is no definite pneumatosis or free air, however large portions of the abdomen are not visualized. IMPRESSION: Enteric tube tip projects over the gastric body. Electronically Signed   By: Constance Holster M.D.   On: 07/23/2018 13:28    Microbiology Recent Results (from the past 240 hour(s))  Culture, blood (routine x 2)     Status: None   Collection Time: 07/27/2018  9:30 PM  Result Value Ref Range Status   Specimen Description BLOOD RIGHT ANTECUBITAL  Final   Special Requests   Final    BOTTLES DRAWN AEROBIC AND ANAEROBIC Blood Culture results may not be optimal due to an inadequate volume of blood received in culture bottles   Culture   Final    NO GROWTH 5 DAYS Performed at [facility], [place] [address], [place], [place] [address]    Report Status Aug 20, 2018 FINAL  Final  Culture, blood (routine x 2)     Status: None   Collection Time: 08/13/2018  9:33 PM  Result Value Ref Range Status   Specimen Description BLOOD RIGHT WRIST  Final   Special Requests   Final    BOTTLES DRAWN AEROBIC AND ANAEROBIC Blood Culture results may not be optimal due to an inadequate volume of blood received in culture bottles   Culture   Final    NO GROWTH 5 DAYS Performed at [facility], [place] [address], [place],  [address]    Report Status Aug 20, 2018 FINAL  Final  SARS Coronavirus 2 (CEPHEID- Performed in [facility] hospital lab), Hosp Order     Status: None   Collection Time: 07/17/2018 10:44 PM  Result Value Ref Range Status   SARS Coronavirus 2 NEGATIVE NEGATIVE Final    Comment: (NOTE) If result is NEGATIVE SARS-CoV-2 target nucleic acids are NOT DETECTED. The SARS-CoV-2 RNA is generally detectable in upper and lower   respiratory specimens during the acute phase of infection. The lowest  concentration of SARS-CoV-2 viral copies this assay can detect is 250  copies / mL. A negative result does not preclude SARS-CoV-2 infection  and should not be used as the sole basis for treatment or other  patient management decisions.  A negative result may occur with  improper specimen collection / handling, submission of specimen other  than nasopharyngeal swab, presence of viral mutation(s) within the  areas targeted by this assay, and inadequate number of viral copies  (<250 copies / mL). A negative result must be combined with clinical  observations, patient history, and epidemiological information. If result is POSITIVE SARS-CoV-2 target nucleic acids are DETECTED. The SARS-CoV-2 RNA is generally detectable in upper and lower  respiratory specimens dur ing the acute phase of infection.  Positive  results are indicative of active infection with SARS-CoV-2.  Clinical  correlation with patient history and other diagnostic information is  necessary to determine patient infection status.  Positive results do  not rule out bacterial infection or co-infection with other viruses. If result is PRESUMPTIVE POSTIVE SARS-CoV-2 nucleic acids MAY BE PRESENT.   A presumptive positive result was obtained on the submitted specimen  and confirmed on repeat testing.  While 2019 novel coronavirus  (SARS-CoV-2) nucleic acids may be  present in the submitted sample  additional confirmatory testing may be necessary for epidemiological  and / or clinical management purposes  to differentiate between  SARS-CoV-2 and other Sarbecovirus currently known to infect humans.  If clinically indicated additional testing with an alternate test  methodology [phone]) is advised. The SARS-CoV-2 RNA is generally  detectable in upper and lower respiratory sp ecimens during the acute  phase of infection. The expected result is Negative. Fact  Sheet for Patients:  StrictlyIdeas.no Fact Sheet for Healthcare Providers: BankingDealers.co.za This test is not yet approved or cleared by the [place] FDA and has been authorized for detection and/or diagnosis of SARS-CoV-2 by FDA under an Emergency Use Authorization (EUA).  This EUA will remain in effect (meaning this test can be used) for the duration of the COVID-19 declaration under Section 564(b)(1) of the Act, 21 U.S.C. section 360bbb-3(b)(1), unless the authorization is terminated or revoked sooner. Performed at [facility], [place] [address], [place], [place] [address]   MRSA PCR Screening     Status: None   Collection Time: 07/20/18  2:04 AM  Result Value Ref Range Status   MRSA by PCR NEGATIVE NEGATIVE Final    Comment:        The GeneXpert MRSA Assay (FDA approved for NASAL specimens only), is one component of a comprehensive MRSA colonization surveillance program. It is not intended to diagnose MRSA infection nor to guide or monitor treatment for MRSA infections. Performed at [facility], [place] [address] [place], [place] [address]   Urine culture     Status: None   Collection Time: 07/20/18 10:48 AM  Result Value Ref Range Status   Specimen Description URINE, RANDOM  Final   Special Requests NONE  Final   Culture   Final    NO GROWTH Performed at [facility], [place] [address] [place], [place] [address]    Report Status 07/21/2018 FINAL  Final  Culture, respiratory (non-expectorated)     Status: None   Collection Time: 07/21/18  4:41 PM  Result Value Ref Range Status   Specimen Description TRACHEAL ASPIRATE  Final   Special Requests NONE  Final   Gram Stain   Final    ABUNDANT WBC PRESENT, PREDOMINANTLY PMN ABUNDANT GRAM POSITIVE RODS FEW GRAM POSITIVE COCCI    Culture   Final    MODERATE Consistent with normal respiratory flora. Performed at [facility], [place] [address],  [place], [place] [address]    Report Status 07/23/2018 FINAL  Final    Lab Basic Metabolic Panel: Recent Labs  Lab 07/22/18 1524 07/23/18 0357  07/23/18 1555 08/19/18 0308 08/19/18 0314 2018-08-19 0433 19-Aug-2018 0617  NA 132* 132*   < > 132* 132* 132* 132* 131*  K 4.7 4.9   < > 4.3 5.1 5.1 5.0 5.5*  CL 103 101  --  99  --  102  --   --   CO2 22 22  --  23  --  17*  --   --   GLUCOSE 156* 158*  --  161*  --  131*  --   --   BUN 12 12  --  13  --  9  --   --   CREATININE 1.40* 1.56*  --  1.48*  --  1.30*  --   --   CALCIUM 7.5* 7.2*  --  7.2*  --  7.3*  --   --   MG  --  2.3  --   --   --  2.5*  --   --   PHOS 2.1*  --   --  2.3*  --   --   --   --    < > = values in this interval not displayed.   Liver Function Tests: Recent Labs  Lab 07/22/18 1524 07/23/18 0357 07/23/18 1555 August 20, 2018 0314  AST  --  148*  --  712*  ALT  --  112*  --  428*  ALKPHOS  --  90  --  77  BILITOT  --  0.5  --  0.8  PROT  --  5.0*  --  5.0*  ALBUMIN 1.8* 1.6* 2.2* 2.3*   No results for input(s): LIPASE, AMYLASE in the last 168 hours. No results for input(s): AMMONIA in the last 168 hours. CBC: Recent Labs  Lab 07/23/18 0357  07/23/18 1322 2018-08-20 0308 08-20-2018 0314 Aug 20, 2018 0433 August 20, 2018 0617  WBC 3.0*  --   --   --  4.7  --   --   HGB 10.0*   < > 8.2* 7.5* 7.2* 8.2* 8.2*  HCT 32.5*   < > 24.0* 22.0* 24.1* 24.0* 24.0*  MCV 90.0  --   --   --  93.1  --   --   PLT 105*  --   --   --  74*  --   --    < > = values in this interval not displayed.   Cardiac Enzymes: No results for input(s): CKTOTAL, CKMB, CKMBINDEX, TROPONINI in the last 168 hours. Sepsis Labs: Recent Labs  Lab 07/23/18 0357 08-20-2018 0314  WBC 3.0* 4.7    Procedures/Operations  Mechanical ventilation, CRRT and vasopressors.  Chianna Spirito 07/29/2018, 8:53 AM

## 2018-08-17 NOTE — Progress Notes (Signed)
[facility] Progress Note Patient Name: Rebecca Benton DOB: February 03, 1951 MRN: [id number]   Date of Service  16-Aug-2018  HPI/Events of Note  Hypoxemia and respiratory acidosis  eICU Interventions  Respiratory rate increased to 32, and PEEP increased to 7 cm H2o        Darren Caldron U [place] Ganus Aug 16, 2018, 3:19 AM

## 2018-08-17 NOTE — Progress Notes (Addendum)
[facility] Progress Note Patient Name: Rebecca Benton DOB: Feb 08, 1951 MRN: [id number]   Date of Service  Aug 08, 2018  HPI/Events of Note  Hypotension while on CRRT. Patient is maxed out on three pressors. Patient is also bradycardic.  eICU Interventions  Pause CRRT for now, 25 % Albumin 25 gm iv bolus x 1, Dopamine at 2.5 mcg/min with titration for heart rate goal of > 50        [place] U Ogan Aug 08, 2018, 2:34 AM

## 2018-08-17 NOTE — Progress Notes (Signed)
[facility] Progress Note Patient Name: Rebecca Benton DOB: 1950-05-07 MRN: [id number]   Date of Service  Jul 29, 2018  HPI/Events of Note  Respiratory acidosis, ionized calcium 1.05  eICU Interventions  Respiratory rate increased to 37, calcium gluconate 2 gm iv x 1, patient is on CRRT currently so K+ of 5.5 will not be specifically treated.        Kerry Kass [place]e Wheatley 2018-07-29, 6:36 AM

## 2018-08-17 NOTE — Progress Notes (Addendum)
Routine morning drawn, Vent changes made per MD order.

## 2018-08-17 NOTE — Progress Notes (Signed)
I responded to a call from the nurse to provide spiritual and grief support for the patient's family. I visited the patient's room with her daughter present. I shared words of comfort and led in prayer. The patient's nieces were in the waiting area. I met with them also and shared a prayer of comfort with them.    Jul 29, 2018 1200  Clinical Encounter Type  Visited With Patient and family together  Visit Type Spiritual support;Death  Referral From Nurse  Consult/Referral To Chaplain  Spiritual Encounters  Spiritual Needs Prayer;Emotional;Grief support    Chaplain Dr Redgie Grayer

## 2018-08-17 NOTE — Progress Notes (Signed)
ANTICOAGULATION CONSULT NOTE - [place] for heparin Indication: chest pain/ACS  Allergies  Allergen Reactions  . Penicillins Anaphylaxis and Other (See Comments)    Did it involve swelling of the face/tongue/throat, SOB, or low BP? Yes Did it involve sudden or severe rash/hives, skin peeling, or any reaction on the inside of your mouth or nose? Yes Did you need to seek medical attention at a hospital or doctor's office? Yes When did it last happen?childhood If all above answers are "NO", may proceed with cephalosporin use.   . Tape Itching and Dermatitis    Patient Measurements: Height: '4\' 11"'$  (149.9 cm) Weight: 128 lb 4.9 oz (58.2 kg) IBW/kg (Calculated) : 43.2  Vital Signs: Temp: 94.6 F (34.8 C) (05/08 1000) Temp Source: Esophageal (05/08 0800) BP: 65/45 (05/08 1000) Pulse Rate: 68 (05/08 0732)  Labs: Recent Labs    07/22/18 0400 07/22/18 0848  07/23/18 0357  07/23/18 1555  08-13-18 0314 08-13-18 0433 Aug 13, 2018 0617  HGB 8.3*  --   --  10.0*   < >  --    < > 7.2* 8.2* 8.2*  HCT 27.2*  --   --  32.5*   < >  --    < > 24.1* 24.0* 24.0*  PLT 92*  --   --  105*  --   --   --  74*  --   --   APTT 121*  --   --  104*  --   --   --  136*  --   --   HEPARINUNFRC  --  0.34  --  0.43  --   --   --  0.43  --   --   CREATININE 1.64*  --    < > 1.56*  --  1.48*  --  1.30*  --   --    < > = values in this interval not displayed.    Estimated Creatinine Clearance: 31.7 mL/min (A) (by C-G formula based on SCr of 1.3 mg/dL (H)).   Medical History: Past Medical History:  Diagnosis Date  . CKD (chronic kidney disease) stage 4, GFR 15-29 ml/min (HCC)   . COPD (chronic obstructive pulmonary disease) ([place])   . Diabetes ([place])   . Hx of CABG   . Hypertension   . Stroke [facility])     Medications:  Medications Prior to Admission  Medication Sig Dispense Refill Last Dose  . albuterol (PROVENTIL) (2.5 MG/3ML) 0.083% nebulizer solution Take 2.5 mg by  nebulization 3 (three) times daily as needed for wheezing or shortness of breath.   UNK  . albuterol (VENTOLIN HFA) 108 (90 Base) MCG/ACT inhaler Inhale 1-2 puffs into the lungs every 6 (six) hours as needed for wheezing or shortness of breath.   UNK  . ALPRAZolam (XANAX) 1 MG tablet Take 1 mg by mouth 3 (three) times daily as needed for anxiety.   UNK  . amLODipine (NORVASC) 5 MG tablet Take 5 mg by mouth daily.   UNK  . aspirin EC 81 MG tablet Take 81 mg by mouth daily.   UNK  . atorvastatin (LIPITOR) 40 MG tablet Take 40 mg by mouth daily.   UNK  . carvedilol (COREG) 25 MG tablet Take 25 mg by mouth 2 (two) times daily with a meal.   UNK  . clopidogrel (PLAVIX) 75 MG tablet Take 75 mg by mouth daily.   UNK  . Cyanocobalamin 1000 MCG/ML KIT Inject 1 mL as directed every  30 (thirty) minutes.   07/11/2018  . DULoxetine (CYMBALTA) 30 MG capsule Take 30 mg by mouth daily.   UNK  . gabapentin (NEURONTIN) 300 MG capsule Take 300 mg by mouth at bedtime.   UNK  . isosorbide mononitrate (IMDUR) 30 MG 24 hr tablet Take 30 mg by mouth daily.   UNK  . oxyCODONE-acetaminophen (PERCOCET) 10-325 MG tablet Take 1 tablet by mouth every 6 (six) hours as needed for pain.   UNK   Scheduled:  . amiodarone  150 mg Intravenous Once  . aspirin  300 mg Rectal Daily  . chlorhexidine gluconate (MEDLINE KIT)  15 mL Mouth Rinse BID  . Chlorhexidine Gluconate Cloth  6 each Topical Daily  . insulin aspart  0-9 Units Subcutaneous Q4H  . mouth rinse  15 mL Mouth Rinse 10 times per day  . pantoprazole (PROTONIX) IV  40 mg Intravenous Daily  . sodium bicarbonate      . sodium chloride flush  10-40 mL Intracatheter Q12H   Infusions:  . sodium chloride 10 mL/hr at 2018-08-14 0900  . amiodarone Stopped (Aug 14, 2018 0321)  . aztreonam Stopped (07/23/18 2208)  . DOPamine    . DOPamine 10 mcg/kg/min (14-Aug-2018 0900)  . epinephrine 20 mcg/min (08/14/18 0900)  . feeding supplement (VITAL AF 1.2 CAL) Stopped (Aug 14, 2018 0600)  .  fentaNYL infusion INTRAVENOUS Stopped (07/23/18 0511)  . heparin 500 Units/hr (08-14-18 0900)  . levETIRAcetam 750 mg (07/23/18 2107)  . midazolam Stopped (07/22/18 0814)  . norepinephrine (LEVOPHED) Adult infusion 40 mcg/min (14-Aug-2018 0900)  . prismasol BGK 0/2.5 500 mL/hr at 08-14-2018 0854  . prismasol BGK 0/2.5 300 mL/hr at 07/23/18 0933  . prismasol BGK 4/2.5 1,500 mL/hr at 08/14/2018 0854  .  sodium bicarbonate (isotonic) infusion in sterile water 75 mL/hr at 2018/08/14 0900  . vancomycin Stopped (07/23/18 1157)  . vasopressin (PITRESSIN) infusion - *FOR SHOCK* 0.03 Units/min (08-14-18 0900)    Assessment: 68yo female presented as code STEMI after PEA/Vfib arrests, was not brought to cath lab given risks > benefit, now to start heparin.  Heparin level remains therapeutic at 0.43 this AM on 500 units/hr. Hgb 10.5 > 8.2, platelet count low but stable.   Goal of Therapy:  Heparin level 0.3-0.7 units/ml Monitor platelets by anticoagulation protocol: Yes   Plan:  Patient with declining BP - will stop heparin drip.  Marguerite Olea, [facility] Clinical Pharmacist Phone [phone]  08/14/2018 10:58 AM

## 2018-08-17 DEATH — deceased

## 2019-10-04 IMAGING — DX PORTABLE ABDOMEN - 1 VIEW
1 series · 1 of 1 positions shown · non-contrast
Comparison: None.

CLINICAL DATA: Enteric tube placement.

EXAM:
PORTABLE ABDOMEN - 1 VIEW

[abdomen]
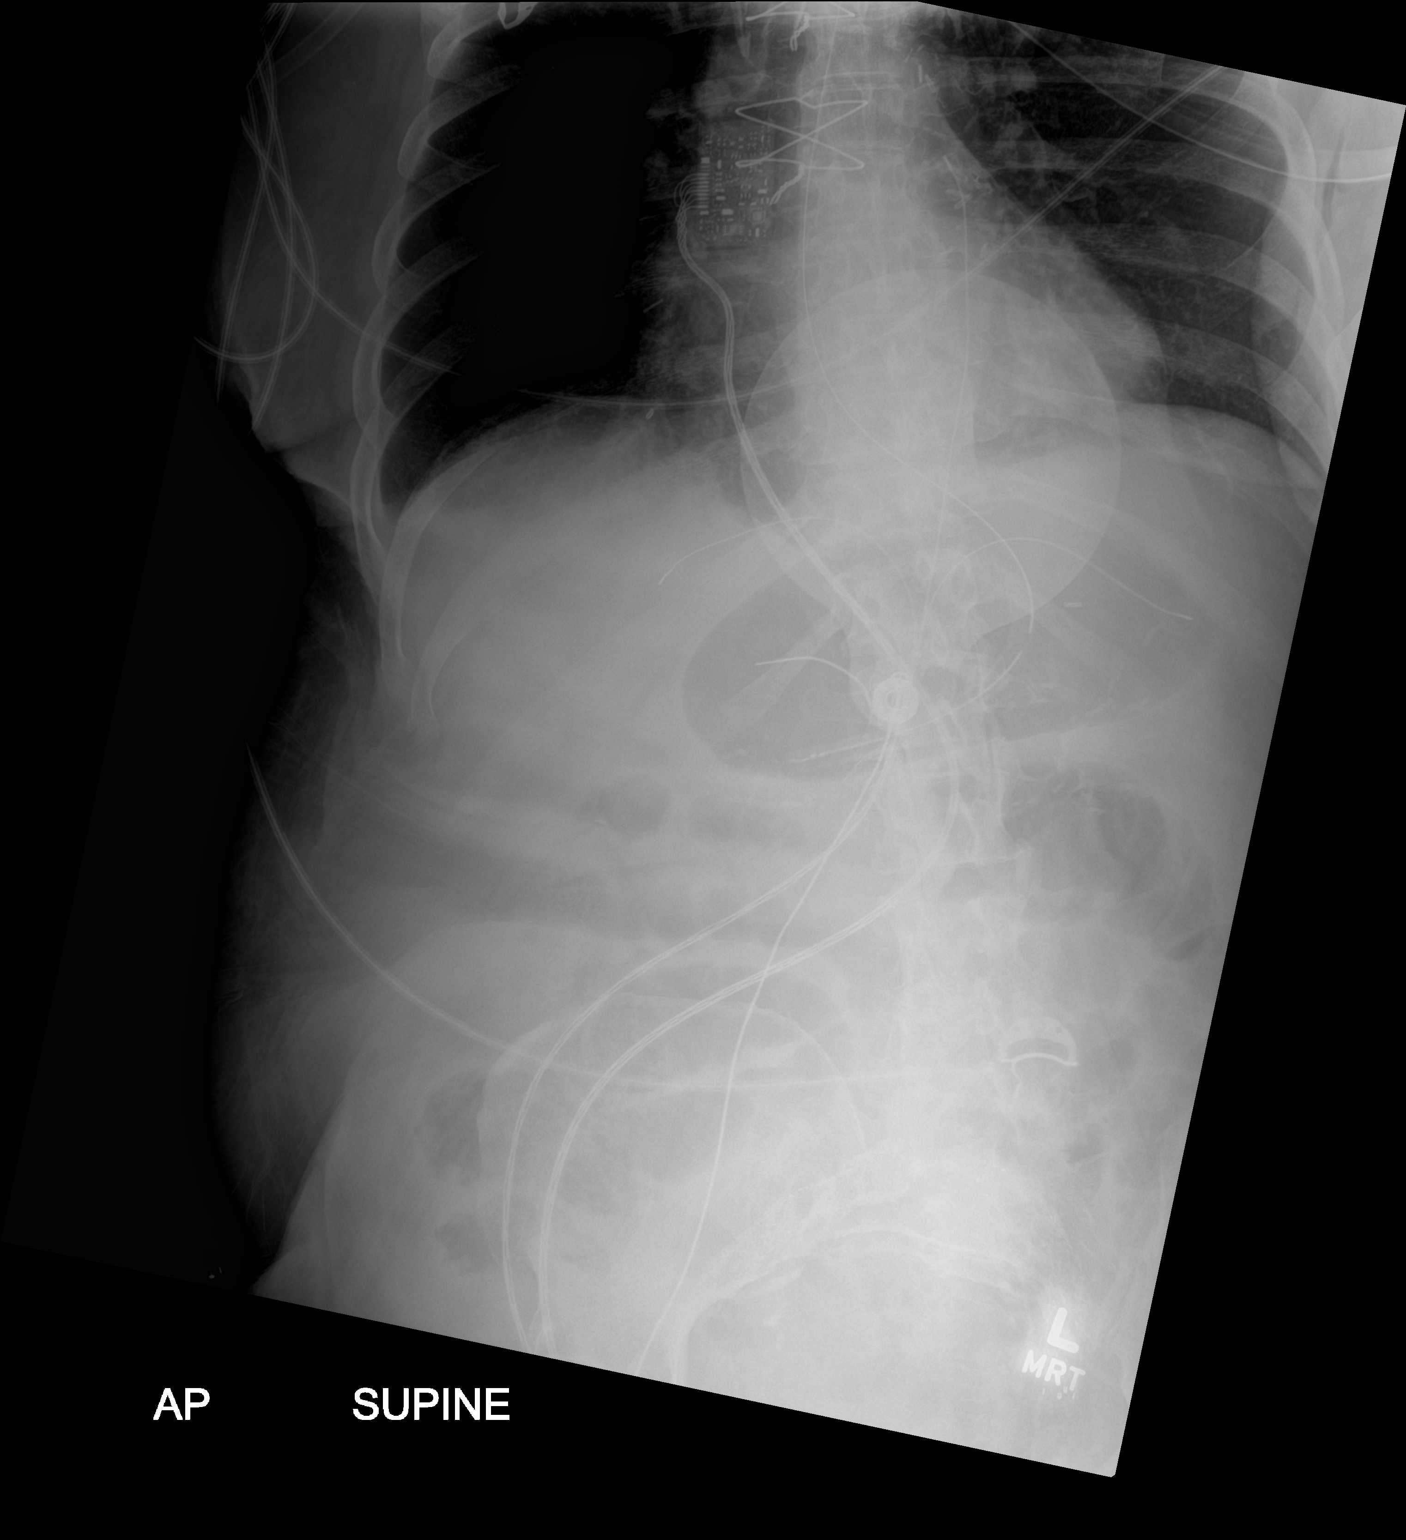

[1 of 1 positions shown; findings below may reference images not displayed]

FINDINGS: The enteric tube tip projects over the gastric body. The bowel gas
pattern is nonspecific. There is no definite pneumatosis or free
air, however large portions of the abdomen are not visualized.
IMPRESSION: Enteric tube tip projects over the gastric body.

## 2019-10-05 IMAGING — DX PORTABLE CHEST - 1 VIEW
1 series · 1 of 1 positions shown · non-contrast
Comparison: 07/20/2018.

CLINICAL DATA: ET tube present.

EXAM:
PORTABLE CHEST 1 VIEW

[chest ap]
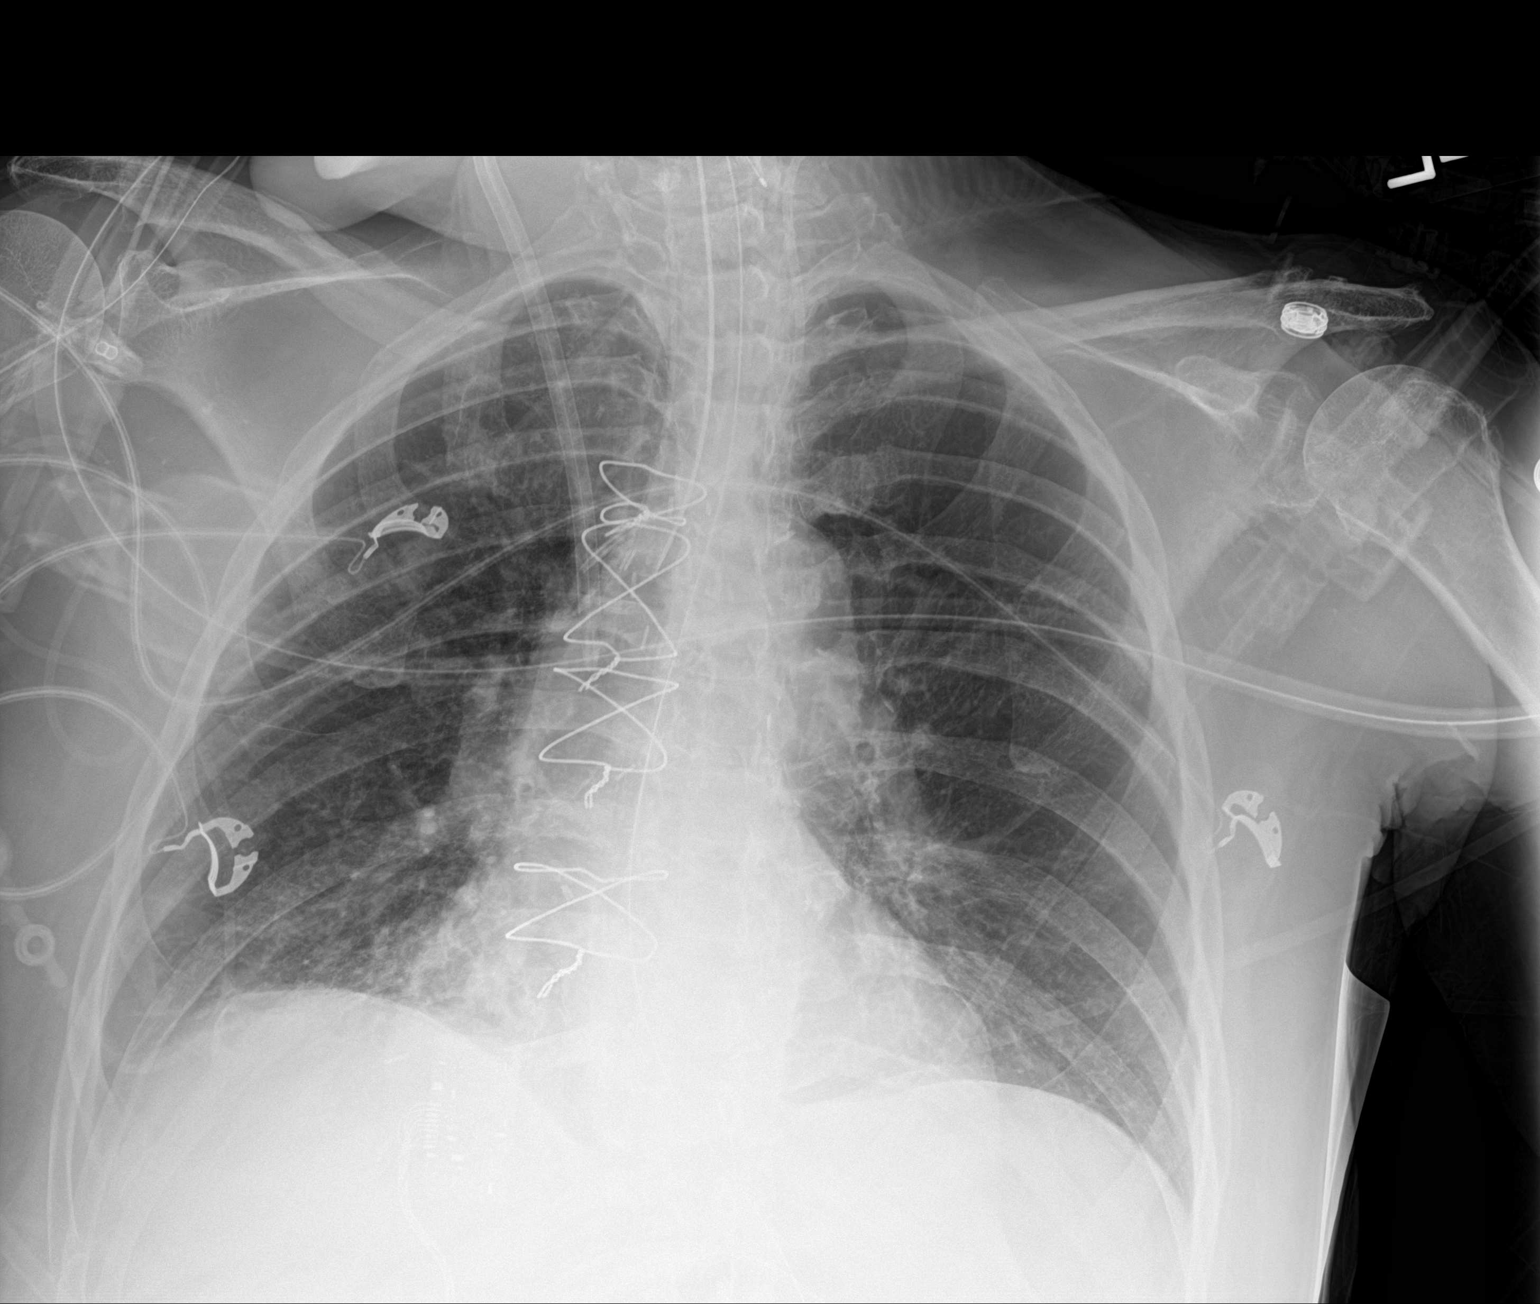

[1 of 1 positions shown; findings below may reference images not displayed]

FINDINGS: ET tube 2.6 cm above carina. Dual lumen catheter tips SVC RA
junction RIGHT jugular approach. Additional central venous line LEFT
IJ approach lies in the distal SVC. Cardiac size is improved. Median
sternotomy wires. Prior CABG. Mild RIGHT base increased markings,
could represent early atelectasis or infiltrate.
IMPRESSION: 1. Mild RIGHT base increased markings, could represent early
atelectasis or infiltrate.
2. ET tube 2.6 cm above carina.
